# Patient Record
Sex: Male | Born: 1955
Health system: Southern US, Community
[De-identification: ages and names within clinical notes are randomized; demographics above are authoritative.]

## PROBLEM LIST (undated history)

## (undated) DIAGNOSIS — F411 Generalized anxiety disorder: Secondary | ICD-10-CM

## (undated) DIAGNOSIS — E119 Type 2 diabetes mellitus without complications: Secondary | ICD-10-CM

## (undated) DIAGNOSIS — R519 Headache, unspecified: Secondary | ICD-10-CM

## (undated) DIAGNOSIS — M549 Dorsalgia, unspecified: Secondary | ICD-10-CM

## (undated) DIAGNOSIS — F259 Schizoaffective disorder, unspecified: Secondary | ICD-10-CM

## (undated) DIAGNOSIS — G8929 Other chronic pain: Secondary | ICD-10-CM

## (undated) DIAGNOSIS — F1220 Cannabis dependence, uncomplicated: Secondary | ICD-10-CM

## (undated) DIAGNOSIS — I10 Essential (primary) hypertension: Secondary | ICD-10-CM

## (undated) DIAGNOSIS — J449 Chronic obstructive pulmonary disease, unspecified: Secondary | ICD-10-CM

## (undated) DIAGNOSIS — F319 Bipolar disorder, unspecified: Secondary | ICD-10-CM

## (undated) DIAGNOSIS — R51 Headache: Secondary | ICD-10-CM

---
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
## 2001-11-15 ENCOUNTER — Emergency Department (HOSPITAL_COMMUNITY): Admission: EM | Admit: 2001-11-15 | Discharge: 2001-11-15 | Payer: Self-pay | Admitting: *Deleted

## 2010-08-03 ENCOUNTER — Telehealth: Payer: Self-pay | Admitting: Internal Medicine

## 2010-08-03 NOTE — Telephone Encounter (Signed)
ERROR [name]

## 2016-04-30 ENCOUNTER — Other Ambulatory Visit: Payer: Self-pay

## 2016-05-03 ENCOUNTER — Encounter (HOSPITAL_COMMUNITY): Payer: Self-pay

## 2016-05-03 ENCOUNTER — Emergency Department (HOSPITAL_COMMUNITY)
Admission: EM | Admit: 2016-05-03 | Discharge: 2016-05-05 | Disposition: A | Discharge status: Home / Self Care | Payer: Medicare Other | Attending: Emergency Medicine | Admitting: Emergency Medicine

## 2016-05-03 DIAGNOSIS — F411 Generalized anxiety disorder: Secondary | ICD-10-CM | POA: Diagnosis not present

## 2016-05-03 DIAGNOSIS — F22 Delusional disorders: Secondary | ICD-10-CM | POA: Insufficient documentation

## 2016-05-03 DIAGNOSIS — Z5181 Encounter for therapeutic drug level monitoring: Secondary | ICD-10-CM | POA: Diagnosis not present

## 2016-05-03 DIAGNOSIS — I10 Essential (primary) hypertension: Secondary | ICD-10-CM | POA: Diagnosis not present

## 2016-05-03 DIAGNOSIS — F3113 Bipolar disorder, current episode manic without psychotic features, severe: Secondary | ICD-10-CM | POA: Diagnosis not present

## 2016-05-03 DIAGNOSIS — F17210 Nicotine dependence, cigarettes, uncomplicated: Secondary | ICD-10-CM | POA: Insufficient documentation

## 2016-05-03 DIAGNOSIS — F3132 Bipolar disorder, current episode depressed, moderate: Secondary | ICD-10-CM | POA: Diagnosis not present

## 2016-05-03 DIAGNOSIS — F600 Paranoid personality disorder: Secondary | ICD-10-CM | POA: Diagnosis not present

## 2016-05-03 DIAGNOSIS — Z046 Encounter for general psychiatric examination, requested by authority: Secondary | ICD-10-CM

## 2016-05-03 DIAGNOSIS — Z79899 Other long term (current) drug therapy: Secondary | ICD-10-CM | POA: Diagnosis not present

## 2016-05-03 HISTORY — DX: Dorsalgia, unspecified: M54.9

## 2016-05-03 HISTORY — DX: Essential (primary) hypertension: I10

## 2016-05-03 HISTORY — DX: Other chronic pain: G89.29

## 2016-05-03 HISTORY — DX: Bipolar disorder, unspecified: F31.9

## 2016-05-03 LAB — RAPID URINE DRUG SCREEN, HOSP PERFORMED
Amphetamines: NOT DETECTED
BARBITURATES: NOT DETECTED
Benzodiazepines: NOT DETECTED
COCAINE: NOT DETECTED
Opiates: NOT DETECTED
TETRAHYDROCANNABINOL: POSITIVE — AB

## 2016-05-03 LAB — COMPREHENSIVE METABOLIC PANEL
ALBUMIN: 3.9 g/dL (ref 3.5–5.0)
ALK PHOS: 60 U/L (ref 38–126)
ALT: 33 U/L (ref 17–63)
ANION GAP: 7 (ref 5–15)
AST: 37 U/L (ref 15–41)
BILIRUBIN TOTAL: 0.5 mg/dL (ref 0.3–1.2)
BUN: 29 mg/dL — ABNORMAL HIGH (ref 6–20)
CO2: 24 mmol/L (ref 22–32)
CREATININE: 1.3 mg/dL — AB (ref 0.61–1.24)
Calcium: 9 mg/dL (ref 8.9–10.3)
Chloride: 105 mmol/L (ref 101–111)
GFR calc non Af Amer: 59 mL/min — ABNORMAL LOW (ref >60)
GLUCOSE: 113 mg/dL — AB (ref 65–99)
Potassium: 4.6 mmol/L (ref 3.5–5.1)
Sodium: 136 mmol/L (ref 135–145)
Total Protein: 6.8 g/dL (ref 6.5–8.1)

## 2016-05-03 LAB — SALICYLATE LEVEL: Salicylate Lvl: <7 mg/dL (ref 2.8–30.0)

## 2016-05-03 LAB — CBC
HEMATOCRIT: 37.5 % — AB (ref 39.0–52.0)
Hemoglobin: 12.5 g/dL — ABNORMAL LOW (ref 13.0–17.0)
MCH: 32.6 pg (ref 26.0–34.0)
MCHC: 33.3 g/dL (ref 30.0–36.0)
MCV: 97.7 fL (ref 78.0–100.0)
Platelets: 95 10*3/uL — ABNORMAL LOW (ref 150–400)
RBC: 3.84 MIL/uL — ABNORMAL LOW (ref 4.22–5.81)
RDW: 15.5 % (ref 11.5–15.5)
WBC: 5.9 10*3/uL (ref 4.0–10.5)

## 2016-05-03 LAB — ETHANOL: Alcohol, Ethyl (B): <5 mg/dL (ref <5)

## 2016-05-03 LAB — ACETAMINOPHEN LEVEL

## 2016-05-03 MED ORDER — ASENAPINE MALEATE 5 MG SL SUBL
10.0000 mg | SUBLINGUAL_TABLET | Freq: Two times a day (BID) | SUBLINGUAL | Status: DC
  Administered 2016-05-03 – 2016-05-04 (×2): 10 mg via SUBLINGUAL
  Filled 2016-05-03 (×2): qty 2

## 2016-05-03 MED ORDER — NICOTINE 21 MG/24HR TD PT24
21.0000 mg | MEDICATED_PATCH | Freq: Every day | TRANSDERMAL | Status: DC
  Administered 2016-05-04: 21 mg via TRANSDERMAL
  Filled 2016-05-03 (×2): qty 1

## 2016-05-03 MED ORDER — ALUM & MAG HYDROXIDE-SIMETH 200-200-20 MG/5ML PO SUSP: 30.0000 mL | ORAL | Status: DC | PRN

## 2016-05-03 MED ORDER — ACETAMINOPHEN 325 MG PO TABS
650.0000 mg | ORAL_TABLET | ORAL | Status: DC | PRN
  Administered 2016-05-04: 650 mg via ORAL
  Filled 2016-05-03: qty 2

## 2016-05-03 MED ORDER — ONDANSETRON HCL 4 MG PO TABS: 4.0000 mg | ORAL_TABLET | Freq: Three times a day (TID) | ORAL | Status: DC | PRN

## 2016-05-03 MED ORDER — ZOLPIDEM TARTRATE 5 MG PO TABS: 5.0000 mg | ORAL_TABLET | Freq: Every evening | ORAL | Status: DC | PRN

## 2016-05-03 MED ORDER — IBUPROFEN 200 MG PO TABS
600.0000 mg | ORAL_TABLET | Freq: Three times a day (TID) | ORAL | Status: DC | PRN
  Administered 2016-05-05: 600 mg via ORAL
  Filled 2016-05-03: qty 3

## 2016-05-03 MED ORDER — LORAZEPAM 1 MG PO TABS: 1.0000 mg | ORAL_TABLET | Freq: Three times a day (TID) | ORAL | Status: DC | PRN
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
## 2016-05-03 NOTE — ED Triage Notes (Signed)
Pt presents w/ delusions that "bad men are messing with my wife."  Pt reports that "people can talk to me through my mind."  Pt was sent by psychiatrist.  Pt reports that he takes 3 Depakote per day.  Sts the psychiatrist called in another medication today, but Pt is unsure what the medication is.  Sts "it's something to ease my mind and help me sleep."  Denies SI/HI/AVH.
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
## 2016-05-04 ENCOUNTER — Encounter (HOSPITAL_COMMUNITY): Payer: Self-pay | Admitting: Emergency Medicine

## 2016-05-04 DIAGNOSIS — F411 Generalized anxiety disorder: Secondary | ICD-10-CM | POA: Diagnosis not present

## 2016-05-04 DIAGNOSIS — F3113 Bipolar disorder, current episode manic without psychotic features, severe: Secondary | ICD-10-CM

## 2016-05-04 DIAGNOSIS — F17210 Nicotine dependence, cigarettes, uncomplicated: Secondary | ICD-10-CM | POA: Diagnosis not present

## 2016-05-04 DIAGNOSIS — Z79899 Other long term (current) drug therapy: Secondary | ICD-10-CM

## 2016-05-04 MED ORDER — GLIPIZIDE 10 MG PO TABS
10.0000 mg | ORAL_TABLET | Freq: Two times a day (BID) | ORAL | Status: DC
  Administered 2016-05-04 – 2016-05-05 (×2): 10 mg via ORAL
  Filled 2016-05-04 (×2): qty 1

## 2016-05-04 MED ORDER — MELOXICAM 15 MG PO TABS
15.0000 mg | ORAL_TABLET | Freq: Every day | ORAL | Status: DC
  Administered 2016-05-04 – 2016-05-05 (×2): 15 mg via ORAL
  Filled 2016-05-04 (×2): qty 1

## 2016-05-04 MED ORDER — LISINOPRIL 40 MG PO TABS
40.0000 mg | ORAL_TABLET | Freq: Every day | ORAL | Status: DC
  Administered 2016-05-04 – 2016-05-05 (×2): 40 mg via ORAL
  Filled 2016-05-04 (×2): qty 1

## 2016-05-04 MED ORDER — HYDROCHLOROTHIAZIDE 25 MG PO TABS
25.0000 mg | ORAL_TABLET | Freq: Every day | ORAL | Status: DC
  Administered 2016-05-04 – 2016-05-05 (×2): 25 mg via ORAL
  Filled 2016-05-04 (×2): qty 1

## 2016-05-04 MED ORDER — ASENAPINE MALEATE 5 MG SL SUBL
10.0000 mg | SUBLINGUAL_TABLET | Freq: Every day | SUBLINGUAL | Status: DC
  Administered 2016-05-04: 10 mg via SUBLINGUAL
  Filled 2016-05-04: qty 2

## 2016-05-04 MED ORDER — TRAZODONE HCL 50 MG PO TABS
50.0000 mg | ORAL_TABLET | Freq: Every evening | ORAL | Status: DC | PRN
  Administered 2016-05-04 (×2): 50 mg via ORAL
  Filled 2016-05-04 (×2): qty 1

## 2016-05-04 MED ORDER — ASENAPINE MALEATE 5 MG SL SUBL: 5.0000 mg | SUBLINGUAL_TABLET | Freq: Every day | SUBLINGUAL | Status: DC

## 2016-05-04 MED ORDER — LAMOTRIGINE 25 MG PO TABS
25.0000 mg | ORAL_TABLET | Freq: Every day | ORAL | Status: DC
  Administered 2016-05-04 – 2016-05-05 (×2): 25 mg via ORAL
  Filled 2016-05-04 (×2): qty 1

## 2016-05-04 MED ORDER — DIVALPROEX SODIUM ER 500 MG PO TB24
1500.0000 mg | ORAL_TABLET | Freq: Every day | ORAL | Status: DC
Start: 2016-05-04 — End: 2016-05-04

## 2016-05-04 NOTE — ED Notes (Signed)
Family at bedside. 

## 2016-05-04 NOTE — ED Notes (Signed)
Lunch tray given. 

## 2016-05-04 NOTE — ED Notes (Signed)
This nurse in pt room with psychiatry team for morning rounds. Pt behavior cooperative on approach. Pt reports he has been off his medication d/t he felt the Depakote was holding him back from his psychic ability of interpreting people. Pt also reports he has not slept good in 3 days. Special checks q 15 mins in place for safety. Video monitoring in place. Will continue to monitor.
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
## 2016-05-04 NOTE — ED Notes (Signed)
Patient denies SI,HI and AVH at this time. Plan of care discussed with patient. Patient voices no complaints or concerns at this time. Encouragement and support provided and safety maintain. Q 15 min safety checks remain in place and video monitoring.

## 2016-05-04 NOTE — ED Notes (Signed)
SBAR Report received from previous nurse. Pt received calm and visible on unit. Pt denies current SI/ HI, A/V H, depression, anxiety, or pain at this time, and appears otherwise stable and free of distress. Pt reminded of camera surveillance, q 15 min rounds, and rules of the milieu. Pt refused to put on yellow socks provided. Several staff have encouraged putting on footwear. Will continue to assess.

## 2016-05-04 NOTE — Progress Notes (Signed)
05/04/16 1444:  LRT introduced self to pt and offered activities.  Pt wanted to play Jenga.  Pt and LRT played Jenga in pt room.  Pt was social and pleasant.  Pt was smiling and stated the activity gave him something to do because "this place is boring".  [name], LRT/CTRS

## 2016-05-04 NOTE — ED Notes (Signed)
Pt wife at bedside visiting with pt.  
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
## 2016-05-05 DIAGNOSIS — Z79899 Other long term (current) drug therapy: Secondary | ICD-10-CM | POA: Diagnosis not present

## 2016-05-05 DIAGNOSIS — F17210 Nicotine dependence, cigarettes, uncomplicated: Secondary | ICD-10-CM | POA: Diagnosis not present

## 2016-05-05 DIAGNOSIS — F411 Generalized anxiety disorder: Secondary | ICD-10-CM | POA: Diagnosis not present

## 2016-05-05 DIAGNOSIS — F3113 Bipolar disorder, current episode manic without psychotic features, severe: Secondary | ICD-10-CM | POA: Diagnosis not present

## 2016-05-05 MED ORDER — GLIPIZIDE 10 MG PO TABS: 10.0000 mg | ORAL_TABLET | Freq: Two times a day (BID) | ORAL | 0 refills | Status: DC

## 2016-05-05 MED ORDER — ASENAPINE MALEATE 5 MG SL SUBL: 10.0000 mg | SUBLINGUAL_TABLET | Freq: Every day | SUBLINGUAL | 1 refills | Status: DC

## 2016-05-05 MED ORDER — DIVALPROEX SODIUM ER 500 MG PO TB24: 1500.0000 mg | ORAL_TABLET | Freq: Every day | ORAL | 0 refills | Status: DC

## 2016-05-05 MED ORDER — ASENAPINE MALEATE 5 MG SL SUBL: 10.0000 mg | SUBLINGUAL_TABLET | Freq: Every day | SUBLINGUAL | 0 refills | Status: DC

## 2016-05-05 MED ORDER — LISINOPRIL 40 MG PO TABS: 40.0000 mg | ORAL_TABLET | ORAL | 0 refills | Status: DC

## 2016-05-05 MED ORDER — HYDROCHLOROTHIAZIDE 25 MG PO TABS: 25.0000 mg | ORAL_TABLET | ORAL | 0 refills | Status: DC

## 2016-05-05 NOTE — [facility] Suicide Risk Assessment (Signed)
Suicide Risk Assessment  Discharge Assessment   [facility] Discharge Suicide Risk Assessment   Principal Problem: Bipolar affective disorder, manic, severe ([place]) Discharge Diagnoses:  Patient Active Problem List   Diagnosis Date Noted  . Bipolar affective disorder, manic, severe ([place]) [F31.13] 05/04/2016    Priority: High  . Generalized anxiety disorder [F41.1] 05/04/2016    Priority: High    Total Time spent with patient: 30 minutes  Musculoskeletal: Strength & Muscle Tone: within normal limits Gait & Station: normal Patient leans: N/A  Psychiatric Specialty Exam: Physical Exam  Constitutional: He is oriented to person, place, and time. He appears well-developed and well-nourished.  HENT:  Head: Normocephalic.  Neck: Normal range of motion.  Respiratory: Effort normal.  Musculoskeletal: Normal range of motion.  Neurological: He is alert and oriented to person, place, and time.  Psychiatric: Judgment normal. His mood appears anxious. His affect is labile. His speech is tangential. He is hyperactive. Thought content is delusional. Cognition and memory are normal.    Review of Systems  Psychiatric/Behavioral: The patient is nervous/anxious and has insomnia.   All other systems reviewed and are negative.   Blood pressure 154/80, pulse 74, temperature 98.4 F (36.9 C), temperature source Oral, resp. rate 18, weight 99.3 kg (219 lb), SpO2 99 %.There is no height or weight on file to calculate BMI.  General Appearance: Casual  Eye Contact:  Fair  Speech:  Pressured, slight  Volume:  Normal  Mood:  Anxious and Euphoric  Affect:  Blunt  Thought Process:  Coherent and Descriptions of Associations: Tangential  Orientation:  Full (Time, Place, and Person)  Thought Content:  Delusions and Tangential  Suicidal Thoughts:  No  Homicidal Thoughts:  No  Memory:  Immediate;   Fair Recent;   Fair Remote;   Fair  Judgement:  Fair  Insight:  Fair  Psychomotor Activity:  Increased   Concentration:  Concentration: Fair and Attention Span: Fair  Recall:  [employer] of Knowledge:  Fair  Language:  Good  Akathisia:  No  Handed:  Right  AIMS (if indicated):     Assets:  Housing Leisure Time Physical Health Resilience Social Support  ADL's:  Intact  Cognition:  WNL  Sleep:       Mental Status Per Nursing Assessment::   On Admission:   mania  Demographic Factors:  Male and Caucasian  Loss Factors: NA  Historical Factors: NA  Risk Reduction Factors:   Sense of responsibility to family, Living with another person, especially a relative, Positive social support and Positive therapeutic relationship  Continued Clinical Symptoms:  None  Cognitive Features That Contribute To Risk:  None    Suicide Risk:  Minimal: No identifiable suicidal ideation.  Patients presenting with no risk factors but with morbid ruminations; may be classified as minimal risk based on the severity of the depressive symptoms    Plan Of Care/Follow-up recommendations:  Activity:  as tolerated Diet:  heart healthy diet  LORD, JAMISON, NP 05/05/2016, 10:35 AM
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
## 2016-05-05 NOTE — ED Notes (Signed)
Pt discharged home. Discharged instructions read to pt who verbalized understanding. All belongings returned to pt who signed for same. Denies SI/HI, is not delusional and not responding to internal stimuli. Escorted pt to the ED exit.    

## 2016-05-05 NOTE — Discharge Instructions (Signed)
For your ongoing behavioral health needs, you are advised to follow up with the [facility].  You have an appointment scheduled for tomorrow, Thursday, 05/06/2016 at 2:45 pm, with Stephannie [name], NP:       [facility]      [id number] [place], [place] [address]      [phone]

## 2016-05-05 NOTE — BH Assessment (Signed)
[facility] Assessment Progress Note  Per Corena Pilgrim, MD, this pt does not require psychiatric hospitalization at this time.  Pt presents under IVC initiated by EDP Daleen Bo, MD, which Dr Darleene Cleaver has rescinded.  Pt is to be discharged from [facility] with recommendation to follow up with Stephannie [name], NP at the [facility], his current outpatient provider.  Pt has an appointment scheduled for tomorrow, 05/06/2016 at 14:45.  This has been included in pt's discharge instructions.  Pt's nurse, Diane, has been notified.  Jalene Mullet, [facility] Triage Specialist [phone]

## 2016-05-06 DIAGNOSIS — F3132 Bipolar disorder, current episode depressed, moderate: Secondary | ICD-10-CM | POA: Diagnosis not present

## 2016-05-06 DIAGNOSIS — F411 Generalized anxiety disorder: Secondary | ICD-10-CM | POA: Diagnosis not present

## 2016-05-09 ENCOUNTER — Emergency Department (HOSPITAL_COMMUNITY)
Admission: EM | Admit: 2016-05-09 | Discharge: 2016-05-11 | Disposition: A | Discharge status: Other Acute Inpatient Hospital | Payer: Medicare Other | Attending: Emergency Medicine | Admitting: Emergency Medicine

## 2016-05-09 ENCOUNTER — Encounter (HOSPITAL_COMMUNITY): Payer: Self-pay | Admitting: Nurse Practitioner

## 2016-05-09 ENCOUNTER — Emergency Department (HOSPITAL_COMMUNITY)
Admission: EM | Admit: 2016-05-09 | Discharge: 2016-05-09 | Disposition: A | Discharge status: Home / Self Care | Payer: Medicare Other

## 2016-05-09 ENCOUNTER — Emergency Department (HOSPITAL_COMMUNITY): Payer: Medicare Other

## 2016-05-09 DIAGNOSIS — F17210 Nicotine dependence, cigarettes, uncomplicated: Secondary | ICD-10-CM | POA: Diagnosis not present

## 2016-05-09 DIAGNOSIS — R4182 Altered mental status, unspecified: Secondary | ICD-10-CM | POA: Diagnosis not present

## 2016-05-09 DIAGNOSIS — Z79899 Other long term (current) drug therapy: Secondary | ICD-10-CM | POA: Insufficient documentation

## 2016-05-09 DIAGNOSIS — F250 Schizoaffective disorder, bipolar type: Secondary | ICD-10-CM | POA: Diagnosis not present

## 2016-05-09 DIAGNOSIS — F3013 Manic episode, severe, without psychotic symptoms: Secondary | ICD-10-CM | POA: Insufficient documentation

## 2016-05-09 DIAGNOSIS — I10 Essential (primary) hypertension: Secondary | ICD-10-CM | POA: Diagnosis not present

## 2016-05-09 DIAGNOSIS — F3113 Bipolar disorder, current episode manic without psychotic features, severe: Secondary | ICD-10-CM

## 2016-05-09 DIAGNOSIS — F1220 Cannabis dependence, uncomplicated: Secondary | ICD-10-CM | POA: Diagnosis present

## 2016-05-09 DIAGNOSIS — F918 Other conduct disorders: Secondary | ICD-10-CM | POA: Diagnosis present

## 2016-05-09 DIAGNOSIS — F411 Generalized anxiety disorder: Secondary | ICD-10-CM | POA: Diagnosis not present

## 2016-05-09 DIAGNOSIS — F309 Manic episode, unspecified: Secondary | ICD-10-CM | POA: Diagnosis not present

## 2016-05-09 DIAGNOSIS — Z9889 Other specified postprocedural states: Secondary | ICD-10-CM | POA: Diagnosis not present

## 2016-05-09 LAB — COMPREHENSIVE METABOLIC PANEL
ALT: 34 U/L (ref 17–63)
AST: 33 U/L (ref 15–41)
Albumin: 3.7 g/dL (ref 3.5–5.0)
Alkaline Phosphatase: 60 U/L (ref 38–126)
Anion gap: 10 (ref 5–15)
BUN: 39 mg/dL — ABNORMAL HIGH (ref 6–20)
CO2: 19 mmol/L — ABNORMAL LOW (ref 22–32)
Calcium: 8.8 mg/dL — ABNORMAL LOW (ref 8.9–10.3)
Chloride: 109 mmol/L (ref 101–111)
Creatinine, Ser: 1.79 mg/dL — ABNORMAL HIGH (ref 0.61–1.24)
GFR calc Af Amer: 46 mL/min — ABNORMAL LOW (ref >60)
GFR calc non Af Amer: 40 mL/min — ABNORMAL LOW (ref >60)
Glucose, Bld: 51 mg/dL — ABNORMAL LOW (ref 65–99)
Potassium: 4.1 mmol/L (ref 3.5–5.1)
Sodium: 138 mmol/L (ref 135–145)
Total Bilirubin: 0.6 mg/dL (ref 0.3–1.2)
Total Protein: 6 g/dL — ABNORMAL LOW (ref 6.5–8.1)

## 2016-05-09 LAB — CBC
HCT: 39 % (ref 39.0–52.0)
Hemoglobin: 13.1 g/dL (ref 13.0–17.0)
MCH: 33.4 pg (ref 26.0–34.0)
MCHC: 33.6 g/dL (ref 30.0–36.0)
MCV: 99.5 fL (ref 78.0–100.0)
Platelets: 135 10*3/uL — ABNORMAL LOW (ref 150–400)
RBC: 3.92 MIL/uL — ABNORMAL LOW (ref 4.22–5.81)
RDW: 15.3 % (ref 11.5–15.5)
WBC: 5.5 10*3/uL (ref 4.0–10.5)

## 2016-05-09 LAB — SALICYLATE LEVEL: Salicylate Lvl: <7 mg/dL (ref 2.8–30.0)

## 2016-05-09 LAB — RAPID URINE DRUG SCREEN, HOSP PERFORMED
Amphetamines: NOT DETECTED
Barbiturates: NOT DETECTED
Benzodiazepines: NOT DETECTED
Cocaine: NOT DETECTED
Opiates: NOT DETECTED
Tetrahydrocannabinol: POSITIVE — AB

## 2016-05-09 LAB — ACETAMINOPHEN LEVEL: Acetaminophen (Tylenol), Serum: <10 ug/mL — ABNORMAL LOW (ref 10–30)

## 2016-05-09 LAB — ETHANOL: Alcohol, Ethyl (B): <5 mg/dL (ref <5)

## 2016-05-09 MED ORDER — ONDANSETRON HCL 4 MG PO TABS: 4.0000 mg | ORAL_TABLET | Freq: Three times a day (TID) | ORAL | Status: DC | PRN

## 2016-05-09 MED ORDER — ACETAMINOPHEN 325 MG PO TABS
650.0000 mg | ORAL_TABLET | ORAL | Status: DC | PRN
  Administered 2016-05-09 – 2016-05-11 (×5): 650 mg via ORAL
  Filled 2016-05-09 (×5): qty 2

## 2016-05-09 MED ORDER — IPRATROPIUM-ALBUTEROL 0.5-2.5 (3) MG/3ML IN SOLN
3.0000 mL | Freq: Once | RESPIRATORY_TRACT | Status: AC
  Administered 2016-05-09: 3 mL via RESPIRATORY_TRACT
  Filled 2016-05-09: qty 3

## 2016-05-09 MED ORDER — SODIUM CHLORIDE 0.9 % IV BOLUS (SEPSIS)
1000.0000 mL | Freq: Once | INTRAVENOUS | Status: AC
  Administered 2016-05-09: 1000 mL via INTRAVENOUS

## 2016-05-09 MED ORDER — NICOTINE 21 MG/24HR TD PT24
21.0000 mg | MEDICATED_PATCH | Freq: Every day | TRANSDERMAL | Status: DC
  Administered 2016-05-10: 21 mg via TRANSDERMAL
  Filled 2016-05-09: qty 1
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
## 2016-05-09 NOTE — ED Notes (Signed)
Bed: WA32 Expected date:  Expected time:  Means of arrival:  Comments: 

## 2016-05-09 NOTE — ED Notes (Signed)
Bed: [facility] Expected date:  Expected time:  Means of arrival:  Comments: Hold for redman

## 2016-05-09 NOTE — ED Notes (Signed)
Pt is 61 yo WM admitted to bed 37.  Pt is alert, awake and responds to questions.  Pt makes good eye contact.  Pt sts he does not feel agitated, anxious or suicidal.  Pt verbally contracts for safety.  Pt sts he has lower back pain and is cold.  Pt given 2 blankets and heat pad.  Pt is in room watching TV.

## 2016-05-10 DIAGNOSIS — F1220 Cannabis dependence, uncomplicated: Secondary | ICD-10-CM | POA: Diagnosis present

## 2016-05-10 DIAGNOSIS — F250 Schizoaffective disorder, bipolar type: Secondary | ICD-10-CM | POA: Diagnosis present

## 2016-05-10 DIAGNOSIS — F17210 Nicotine dependence, cigarettes, uncomplicated: Secondary | ICD-10-CM

## 2016-05-10 DIAGNOSIS — Z9889 Other specified postprocedural states: Secondary | ICD-10-CM

## 2016-05-10 DIAGNOSIS — F411 Generalized anxiety disorder: Secondary | ICD-10-CM

## 2016-05-10 DIAGNOSIS — Z79899 Other long term (current) drug therapy: Secondary | ICD-10-CM

## 2016-05-10 HISTORY — DX: Cannabis dependence, uncomplicated: F12.20

## 2016-05-10 LAB — URINALYSIS, ROUTINE W REFLEX MICROSCOPIC
Bilirubin Urine: NEGATIVE
GLUCOSE, UA: NEGATIVE mg/dL
HGB URINE DIPSTICK: NEGATIVE
Ketones, ur: 20 mg/dL — AB
Leukocytes, UA: NEGATIVE
Nitrite: NEGATIVE
PH: 6 (ref 5.0–8.0)
PROTEIN: NEGATIVE mg/dL
SPECIFIC GRAVITY, URINE: 1.018 (ref 1.005–1.030)

## 2016-05-10 MED ORDER — RISPERIDONE 1 MG PO TABS
1.0000 mg | ORAL_TABLET | Freq: Two times a day (BID) | ORAL | Status: DC
  Administered 2016-05-10 (×2): 1 mg via ORAL
  Filled 2016-05-10 (×2): qty 1

## 2016-05-10 MED ORDER — LISINOPRIL 40 MG PO TABS
40.0000 mg | ORAL_TABLET | ORAL | Status: DC
  Administered 2016-05-10 – 2016-05-11 (×2): 40 mg via ORAL
  Filled 2016-05-10 (×2): qty 1

## 2016-05-10 MED ORDER — BENZTROPINE MESYLATE 0.5 MG PO TABS
0.5000 mg | ORAL_TABLET | Freq: Two times a day (BID) | ORAL | Status: DC
  Administered 2016-05-10 (×2): 0.5 mg via ORAL
  Filled 2016-05-10 (×2): qty 1

## 2016-05-10 MED ORDER — LAMOTRIGINE 25 MG PO TABS
25.0000 mg | ORAL_TABLET | Freq: Every day | ORAL | Status: DC
  Administered 2016-05-10: 25 mg via ORAL
  Filled 2016-05-10: qty 1

## 2016-05-10 MED ORDER — GLIPIZIDE 10 MG PO TABS
10.0000 mg | ORAL_TABLET | Freq: Two times a day (BID) | ORAL | Status: DC
  Administered 2016-05-10 – 2016-05-11 (×2): 10 mg via ORAL
  Filled 2016-05-10 (×2): qty 1

## 2016-05-10 MED ORDER — HYDROXYZINE HCL 25 MG PO TABS
25.0000 mg | ORAL_TABLET | Freq: Two times a day (BID) | ORAL | Status: DC
  Administered 2016-05-10 – 2016-05-11 (×2): 25 mg via ORAL
  Filled 2016-05-10 (×3): qty 1

## 2016-05-10 MED ORDER — HYDROCHLOROTHIAZIDE 25 MG PO TABS
25.0000 mg | ORAL_TABLET | ORAL | Status: DC
  Administered 2016-05-10 – 2016-05-11 (×2): 25 mg via ORAL
  Filled 2016-05-10 (×2): qty 1

## 2016-05-10 NOTE — BHH Counselor (Signed)
Clinician received a call from [facility] informing that the pt's referral was received.   Edd Fabian, MS, [facility], [facility] Triage Specialist [phone]

## 2016-05-10 NOTE — BHH Counselor (Signed)
Clinician spoke to [facility] and noted the pt was accepted to [facility] in [place]. Pt can come after 0700, tomorrow. Attending physiican is Dr. Shirlean Schlein. Nursing report: [phone]. Clinician provide the information to [facility].   Edd Fabian, MS, [facility], [facility] Triage Specialist [phone]

## 2016-05-10 NOTE — Progress Notes (Signed)
05/10/16 1406:  LRT went to pt room to offer activities.  Pt couldn't decide what he wanted to do.  Pt eventually decided he wanted to play checkers.  LRT played checkers with pt and talked with him about his day.  [name], LRT/CTRS

## 2016-05-10 NOTE — ED Notes (Signed)
Patient states he was not taking medications as ordered and that is why he is here. Patient wants to get back on medications and go home. Patient provided educated to  take medications as ordered when he does go home. And Patient with Q 15 minute checks in progress and patient remains safe on unit.
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
## 2016-05-10 NOTE — BH Assessment (Signed)
[facility] Assessment Progress Note  Per Corena Pilgrim, MD, this pt requires psychiatric hospitalization at this time.  The following facilities have been contacted to seek placement for this pt, with results as noted:  Beds available, information sent, decision pending:  [facility]   At capacity:  Santiago Bumpers, [place] Triage Specialist [phone]
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
## 2016-05-10 NOTE — ED Notes (Signed)
Pt had an uneventful day. He spent most of the morning attention seeking asking for things like wanting the TV channel turned, hot packs, wanting to talk but presented with a pleasant affect. He did c/o pain and was given PRN Tylenol with fair results. Pt denied SI/HI/AVH. 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 His wife visited and dropped off his glasses. Their interaction was very positive and therapeutic for pt. Pt asleep at this time with no further complaints.

## 2016-05-11 DIAGNOSIS — R45850 Homicidal ideations: Secondary | ICD-10-CM | POA: Diagnosis not present

## 2016-05-11 DIAGNOSIS — R079 Chest pain, unspecified: Secondary | ICD-10-CM | POA: Diagnosis not present

## 2016-05-11 DIAGNOSIS — E1121 Type 2 diabetes mellitus with diabetic nephropathy: Secondary | ICD-10-CM | POA: Diagnosis not present

## 2016-05-11 DIAGNOSIS — F259 Schizoaffective disorder, unspecified: Secondary | ICD-10-CM | POA: Diagnosis not present

## 2016-05-11 DIAGNOSIS — E039 Hypothyroidism, unspecified: Secondary | ICD-10-CM | POA: Diagnosis not present

## 2016-05-11 DIAGNOSIS — I519 Heart disease, unspecified: Secondary | ICD-10-CM | POA: Diagnosis not present

## 2016-05-11 DIAGNOSIS — F3013 Manic episode, severe, without psychotic symptoms: Secondary | ICD-10-CM | POA: Diagnosis not present

## 2016-05-11 DIAGNOSIS — G4700 Insomnia, unspecified: Secondary | ICD-10-CM | POA: Diagnosis not present

## 2016-05-11 DIAGNOSIS — F17200 Nicotine dependence, unspecified, uncomplicated: Secondary | ICD-10-CM | POA: Diagnosis not present

## 2016-05-11 DIAGNOSIS — F250 Schizoaffective disorder, bipolar type: Secondary | ICD-10-CM | POA: Diagnosis not present

## 2016-05-11 DIAGNOSIS — F319 Bipolar disorder, unspecified: Secondary | ICD-10-CM | POA: Diagnosis not present

## 2016-05-11 DIAGNOSIS — M545 Low back pain: Secondary | ICD-10-CM | POA: Diagnosis not present

## 2016-05-11 DIAGNOSIS — M1990 Unspecified osteoarthritis, unspecified site: Secondary | ICD-10-CM | POA: Diagnosis not present

## 2016-05-11 DIAGNOSIS — Z79899 Other long term (current) drug therapy: Secondary | ICD-10-CM | POA: Diagnosis not present

## 2016-05-11 DIAGNOSIS — M2550 Pain in unspecified joint: Secondary | ICD-10-CM | POA: Diagnosis not present

## 2016-05-11 DIAGNOSIS — Z716 Tobacco abuse counseling: Secondary | ICD-10-CM | POA: Diagnosis not present

## 2016-05-11 DIAGNOSIS — F201 Disorganized schizophrenia: Secondary | ICD-10-CM | POA: Diagnosis not present

## 2016-05-11 DIAGNOSIS — F17210 Nicotine dependence, cigarettes, uncomplicated: Secondary | ICD-10-CM | POA: Diagnosis not present

## 2016-05-11 DIAGNOSIS — E785 Hyperlipidemia, unspecified: Secondary | ICD-10-CM | POA: Diagnosis not present

## 2016-05-11 DIAGNOSIS — R790 Abnormal level of blood mineral: Secondary | ICD-10-CM | POA: Diagnosis not present

## 2016-05-11 DIAGNOSIS — R51 Headache: Secondary | ICD-10-CM | POA: Diagnosis not present

## 2016-05-11 DIAGNOSIS — R7309 Other abnormal glucose: Secondary | ICD-10-CM | POA: Diagnosis not present

## 2016-05-11 DIAGNOSIS — E109 Type 1 diabetes mellitus without complications: Secondary | ICD-10-CM | POA: Diagnosis not present

## 2016-05-11 DIAGNOSIS — I10 Essential (primary) hypertension: Secondary | ICD-10-CM | POA: Diagnosis not present

## 2016-05-11 DIAGNOSIS — E7800 Pure hypercholesterolemia, unspecified: Secondary | ICD-10-CM | POA: Diagnosis not present

## 2016-05-11 NOTE — ED Notes (Signed)
Attempted to call report and was told they would have to call back after med pass.

## 2016-05-11 NOTE — ED Notes (Signed)
Pt discharged ambulatory with [facility].  All belongings were sent with pt.  Pt was calm and cooperative at time of transport.

## 2016-06-08 DIAGNOSIS — M5136 Other intervertebral disc degeneration, lumbar region: Secondary | ICD-10-CM | POA: Diagnosis not present

## 2016-06-09 DIAGNOSIS — I10 Essential (primary) hypertension: Secondary | ICD-10-CM | POA: Diagnosis not present

## 2016-06-09 DIAGNOSIS — E119 Type 2 diabetes mellitus without complications: Secondary | ICD-10-CM | POA: Diagnosis not present

## 2016-06-09 DIAGNOSIS — F319 Bipolar disorder, unspecified: Secondary | ICD-10-CM | POA: Diagnosis not present

## 2016-06-09 DIAGNOSIS — J45909 Unspecified asthma, uncomplicated: Secondary | ICD-10-CM | POA: Diagnosis not present

## 2016-06-09 DIAGNOSIS — Z7984 Long term (current) use of oral hypoglycemic drugs: Secondary | ICD-10-CM | POA: Diagnosis not present

## 2016-06-09 DIAGNOSIS — M25512 Pain in left shoulder: Secondary | ICD-10-CM | POA: Diagnosis not present

## 2016-06-09 DIAGNOSIS — G8929 Other chronic pain: Secondary | ICD-10-CM | POA: Diagnosis not present

## 2016-06-10 DIAGNOSIS — F312 Bipolar disorder, current episode manic severe with psychotic features: Secondary | ICD-10-CM | POA: Diagnosis not present

## 2016-06-11 DIAGNOSIS — F312 Bipolar disorder, current episode manic severe with psychotic features: Secondary | ICD-10-CM | POA: Diagnosis not present

## 2016-07-26 DIAGNOSIS — F250 Schizoaffective disorder, bipolar type: Secondary | ICD-10-CM | POA: Diagnosis not present

## 2016-07-29 DIAGNOSIS — F29 Unspecified psychosis not due to a substance or known physiological condition: Secondary | ICD-10-CM | POA: Diagnosis not present

## 2016-07-29 DIAGNOSIS — G8929 Other chronic pain: Secondary | ICD-10-CM | POA: Diagnosis not present

## 2016-07-29 DIAGNOSIS — F319 Bipolar disorder, unspecified: Secondary | ICD-10-CM | POA: Diagnosis not present

## 2016-07-29 DIAGNOSIS — R5383 Other fatigue: Secondary | ICD-10-CM | POA: Diagnosis not present

## 2016-07-29 DIAGNOSIS — E119 Type 2 diabetes mellitus without complications: Secondary | ICD-10-CM | POA: Diagnosis not present

## 2016-07-29 DIAGNOSIS — M25512 Pain in left shoulder: Secondary | ICD-10-CM | POA: Diagnosis not present

## 2016-08-26 DIAGNOSIS — I10 Essential (primary) hypertension: Secondary | ICD-10-CM | POA: Diagnosis not present

## 2016-08-26 DIAGNOSIS — G8929 Other chronic pain: Secondary | ICD-10-CM | POA: Diagnosis not present

## 2016-08-26 DIAGNOSIS — Z1211 Encounter for screening for malignant neoplasm of colon: Secondary | ICD-10-CM | POA: Diagnosis not present

## 2016-08-26 DIAGNOSIS — R0600 Dyspnea, unspecified: Secondary | ICD-10-CM | POA: Diagnosis not present

## 2016-09-19 ENCOUNTER — Encounter (HOSPITAL_COMMUNITY): Payer: Self-pay | Admitting: Emergency Medicine

## 2016-09-19 ENCOUNTER — Ambulatory Visit (HOSPITAL_COMMUNITY)
Admission: EM | Admit: 2016-09-19 | Discharge: 2016-09-19 | Disposition: A | Discharge status: Home / Self Care | Payer: Medicare Other | Attending: Family Medicine | Admitting: Family Medicine

## 2016-09-19 DIAGNOSIS — G43919 Migraine, unspecified, intractable, without status migrainosus: Secondary | ICD-10-CM

## 2016-09-19 LAB — POCT I-STAT, CHEM 8
BUN: 31 mg/dL — ABNORMAL HIGH (ref 6–20)
CHLORIDE: 106 mmol/L (ref 101–111)
CREATININE: 1.4 mg/dL — AB (ref 0.61–1.24)
Calcium, Ion: 1.23 mmol/L (ref 1.15–1.40)
GLUCOSE: 75 mg/dL (ref 65–99)
HCT: 46 % (ref 39.0–52.0)
Hemoglobin: 15.6 g/dL (ref 13.0–17.0)
POTASSIUM: 4.1 mmol/L (ref 3.5–5.1)
Sodium: 137 mmol/L (ref 135–145)
TCO2: 23 mmol/L (ref 0–100)

## 2016-09-19 MED ORDER — DEXAMETHASONE SODIUM PHOSPHATE 10 MG/ML IJ SOLN
INTRAMUSCULAR | Status: AC
Start: 2016-09-19 — End: 2016-09-19
  Filled 2016-09-19: qty 1

## 2016-09-19 MED ORDER — METOCLOPRAMIDE HCL 5 MG/ML IJ SOLN
INTRAMUSCULAR | Status: AC
Start: 2016-09-19 — End: 2016-09-19
  Filled 2016-09-19: qty 2

## 2016-09-19 MED ORDER — METOCLOPRAMIDE HCL 5 MG/ML IJ SOLN
5.0000 mg | Freq: Once | INTRAMUSCULAR | Status: AC
  Administered 2016-09-19: 18:00:00 via INTRAMUSCULAR

## 2016-09-19 MED ORDER — KETOROLAC TROMETHAMINE 60 MG/2ML IM SOLN
60.0000 mg | Freq: Once | INTRAMUSCULAR | Status: AC
  Administered 2016-09-19: 60 mg via INTRAMUSCULAR

## 2016-09-19 MED ORDER — KETOROLAC TROMETHAMINE 60 MG/2ML IM SOLN
INTRAMUSCULAR | Status: AC
Start: 2016-09-19 — End: 2016-09-19
  Filled 2016-09-19: qty 2

## 2016-09-19 MED ORDER — SUMATRIPTAN SUCCINATE 6 MG/0.5ML ~~LOC~~ SOLN
6.0000 mg | Freq: Once | SUBCUTANEOUS | Status: AC
  Administered 2016-09-19: 6 mg via SUBCUTANEOUS

## 2016-09-19 MED ORDER — SUMATRIPTAN SUCCINATE 6 MG/0.5ML ~~LOC~~ SOLN
SUBCUTANEOUS | Status: AC
  Filled 2016-09-19: qty 0.5

## 2016-09-19 MED ORDER — DEXAMETHASONE SODIUM PHOSPHATE 10 MG/ML IJ SOLN
10.0000 mg | Freq: Once | INTRAMUSCULAR | Status: AC
  Administered 2016-09-19: 10 mg via INTRAMUSCULAR

## 2016-09-19 NOTE — Discharge Instructions (Signed)
Take 50mg  of benadryl when you get home tonight to help sleep if you still feel like you are unable to rest.

## 2016-09-19 NOTE — ED Notes (Signed)
Discharged  By provider   So  Reverted  Back  To last  Pain   Scale   On  Arrival  Which  Was  5

## 2016-09-19 NOTE — ED Provider Notes (Signed)
CSN: [id number]     Arrival date & time 09/19/16  1557 History   None    Chief Complaint  Patient presents with  . Headache   (Consider location/radiation/quality/duration/timing/severity/associated sxs/prior Treatment)  HPI   Patient is a 61 year old male presenting today with complaints of an intractable headache for the past 5-6 days. Patient states that he has tried tramadol, Tylenol rapid release, Excedrin, ibuprofen, along with Bayer aspirin. This is over the course of several days and not simultaneously.  Patient reports positive photophobia, positive nausea and vomiting, and some dizziness when he bends over and stands up too quickly. And states has history of migraine headaches in his 69s to 31s but that he has been mostly headache free for the past several years.   Patient denies weakness or visual changes. Patient states he is bipolar for which she is supposed to be taking Depakote. Patient states he's not been taking his Depakote regularly, but did take some this morning.   In addition the patient reports he has been a smoker since his adolescence. Patient states was told several years ago that there was issues with his blood work is concerned that might be contributing factor. The patient is requesting referral to a new primary care provider and assistance with chronic pain management.  Past Medical History:  Diagnosis Date  . Back pain   . Bipolar disorder ([place])   . Chronic pain   . Hypertension    Past Surgical History:  Procedure Laterality Date  . APPENDECTOMY     History reviewed. No pertinent family history. Social History  Substance Use Topics  . Smoking status: Current Every Day Smoker    Packs/day: 2.00    Types: Cigarettes  . Smokeless tobacco: Never Used  . Alcohol use Yes     Comment: occ    Review of Systems  Constitutional: Positive for fatigue. Negative for fever.  HENT: Negative.  Negative for congestion, sinus pain and sinus pressure.   Eyes:  Positive for photophobia. Negative for pain, redness and visual disturbance.  Respiratory: Negative.  Negative for cough and shortness of breath.   Cardiovascular: Negative.   Gastrointestinal: Negative.  Negative for diarrhea, nausea and vomiting.  Endocrine: Negative.   Genitourinary: Negative.   Musculoskeletal: Positive for back pain. Negative for neck pain and neck stiffness.  Skin: Negative.   Allergic/Immunologic: Negative.   Neurological: Positive for dizziness and headaches. Negative for tremors, seizures, syncope, facial asymmetry, speech difficulty, weakness, light-headedness and numbness.  Hematological: Negative.   Psychiatric/Behavioral: The patient is nervous/anxious.     Allergies  Patient has no known allergies.  Home Medications   Prior to Admission medications   Medication Sig Start Date End Date Taking? Authorizing Provider  asenapine (SAPHRIS) 5 MG SUBL 24 hr tablet Place 2 tablets (10 mg total) under the tongue at bedtime. 05/05/16   Patrecia Pour, NP  divalproex (DEPAKOTE ER) 500 MG 24 hr tablet Take 3 tablets (1,500 mg total) by mouth at bedtime. 05/05/16   Patrecia Pour, NP  glipiZIDE (GLUCOTROL) 10 MG tablet Take 1 tablet (10 mg total) by mouth 2 (two) times daily. 05/05/16   Patrecia Pour, NP  hydrochlorothiazide (HYDRODIURIL) 25 MG tablet Take 1 tablet (25 mg total) by mouth every morning. 05/05/16   Patrecia Pour, NP  lisinopril (PRINIVIL,ZESTRIL) 40 MG tablet Take 1 tablet (40 mg total) by mouth every morning. 05/05/16   Patrecia Pour, NP  meloxicam (MOBIC) 15 MG tablet Take 15 mg  by mouth every morning. 03/31/16   [provider]  Omega-3 Fatty Acids (FISH OIL) 1000 MG CAPS Take 1,000 mg by mouth at bedtime.    [provider]  traMADol (ULTRAM) 50 MG tablet Take 50-100 mg by mouth every 6 (six) hours as needed for moderate pain or severe pain.    [provider]   Meds Ordered and Administered this Visit   Medications   ketorolac (TORADOL) injection 60 mg (60 mg Intramuscular Given 09/19/16 1730)  metoCLOPramide (REGLAN) injection 5 mg ( Intramuscular Given 09/19/16 1733)  dexamethasone (DECADRON) injection 10 mg (10 mg Intramuscular Given 09/19/16 1734)  SUMAtriptan (IMITREX) injection 6 mg (6 mg Subcutaneous Given 09/19/16 1728)    BP 99/71 (BP Location: Right Arm)   Pulse 84   Temp 97.9 F (36.6 C) (Oral)   Resp 18   SpO2 99%  No data found.   Physical Exam  Constitutional: He is oriented to person, place, and time. He appears well-developed and well-nourished. No distress.  HENT:  Head: Normocephalic and atraumatic.  Right Ear: External ear normal.  Left Ear: External ear normal.  Mouth/Throat: Oropharynx is clear and moist.  Eyes: Conjunctivae and EOM are normal. Pupils are equal, round, and reactive to light. Right eye exhibits no discharge. Left eye exhibits no discharge. No scleral icterus.  Neck: Normal range of motion. Neck supple.  Negative nuchal rigidity.  Cardiovascular: Normal rate, regular rhythm, normal heart sounds and intact distal pulses.  Exam reveals no gallop and no friction rub.   No murmur heard. Pulmonary/Chest: Effort normal and breath sounds normal. No respiratory distress. He has no wheezes. He has no rales. He exhibits no tenderness.  Musculoskeletal: Normal range of motion.  Strength 5/5 x all 4 extremities.   Neurological: He is alert and oriented to person, place, and time. He displays normal reflexes. No cranial nerve deficit or sensory deficit. He exhibits normal muscle tone. Coordination normal.   Cranial nerves II through XII through 12 grossly intact.   Skin: Skin is warm and dry. Capillary refill takes less than 2 seconds. He is not diaphoretic.  Nursing note and vitals reviewed.   Urgent Care Course     Procedures (including critical care time)  Labs Review Labs Reviewed  POCT I-STAT, CHEM 8 - Abnormal; Notable for the following:       Result Value    BUN 31 (*)    Creatinine, Ser 1.40 (*)    All other components within normal limits   Results for orders placed or performed during the hospital encounter of 09/19/16  I-STAT, chem 8  Result Value Ref Range   Sodium 137 135 - 145 mmol/L   Potassium 4.1 3.5 - 5.1 mmol/L   Chloride 106 101 - 111 mmol/L   BUN 31 (H) 6 - 20 mg/dL   Creatinine, Ser 1.40 (H) 0.61 - 1.24 mg/dL   Glucose, Bld 75 65 - 99 mg/dL   Calcium, Ion 1.23 1.15 - 1.40 mmol/L   TCO2 23 0 - 100 mmol/L   Hemoglobin 15.6 13.0 - 17.0 g/dL   HCT 46.0 39.0 - 52.0 %    Imaging Review No results found.  Patient requested bloodwork after headache treatment stating his old doctor had told him he had some abnormalities, he wasn't sure what.  Advised patient and wife of elevation in BUN and creatinine.  Told to call [facility] Tuesday am to set up appointment for follow up asap.  Advised Dr. Joseph Art.  MDM   1. Intractable migraine without status migrainosus, unspecified migraine type    Meds ordered this encounter  Medications  . ketorolac (TORADOL) injection 60 mg  . metoCLOPramide (REGLAN) injection 5 mg  . dexamethasone (DECADRON) injection 10 mg  . SUMAtriptan (IMITREX) injection 6 mg   The usual and customary discharge instructions and warnings were given.  The patient and his wife verbalized understanding and agrees to plan of care.       Nehemiah Settle, NP 09/19/16 1807    Nehemiah Settle, NP 09/19/16 [phone]

## 2016-09-19 NOTE — ED Triage Notes (Signed)
The patient presented to the [facility] with a complaint of a headache x 3 days. The patient reported a previous hx of migraines.

## 2016-09-28 ENCOUNTER — Inpatient Hospital Stay (HOSPITAL_COMMUNITY)
Admission: AD | Admit: 2016-09-28 | Discharge: 2016-10-02 | DRG: 885 | Disposition: A | Discharge status: Home / Self Care | Payer: Medicare Other | Source: Intra-hospital | Attending: Psychiatry | Admitting: Psychiatry

## 2016-09-28 ENCOUNTER — Encounter (HOSPITAL_COMMUNITY): Payer: Self-pay | Admitting: Emergency Medicine

## 2016-09-28 ENCOUNTER — Emergency Department (HOSPITAL_COMMUNITY)
Admission: EM | Admit: 2016-09-28 | Discharge: 2016-09-28 | Disposition: A | Discharge status: Other Acute Inpatient Hospital | Payer: Medicare Other | Attending: Emergency Medicine | Admitting: Emergency Medicine

## 2016-09-28 ENCOUNTER — Encounter (HOSPITAL_COMMUNITY): Payer: Self-pay

## 2016-09-28 DIAGNOSIS — G8929 Other chronic pain: Secondary | ICD-10-CM | POA: Diagnosis present

## 2016-09-28 DIAGNOSIS — F319 Bipolar disorder, unspecified: Secondary | ICD-10-CM | POA: Diagnosis not present

## 2016-09-28 DIAGNOSIS — E119 Type 2 diabetes mellitus without complications: Secondary | ICD-10-CM | POA: Diagnosis present

## 2016-09-28 DIAGNOSIS — Z560 Unemployment, unspecified: Secondary | ICD-10-CM

## 2016-09-28 DIAGNOSIS — M549 Dorsalgia, unspecified: Secondary | ICD-10-CM | POA: Diagnosis present

## 2016-09-28 DIAGNOSIS — Z046 Encounter for general psychiatric examination, requested by authority: Secondary | ICD-10-CM | POA: Diagnosis present

## 2016-09-28 DIAGNOSIS — J45909 Unspecified asthma, uncomplicated: Secondary | ICD-10-CM | POA: Diagnosis present

## 2016-09-28 DIAGNOSIS — Z7951 Long term (current) use of inhaled steroids: Secondary | ICD-10-CM | POA: Diagnosis not present

## 2016-09-28 DIAGNOSIS — E871 Hypo-osmolality and hyponatremia: Secondary | ICD-10-CM | POA: Diagnosis present

## 2016-09-28 DIAGNOSIS — F250 Schizoaffective disorder, bipolar type: Principal | ICD-10-CM | POA: Diagnosis present

## 2016-09-28 DIAGNOSIS — Z9114 Patient's other noncompliance with medication regimen: Secondary | ICD-10-CM | POA: Diagnosis not present

## 2016-09-28 DIAGNOSIS — I10 Essential (primary) hypertension: Secondary | ICD-10-CM | POA: Insufficient documentation

## 2016-09-28 DIAGNOSIS — Z79899 Other long term (current) drug therapy: Secondary | ICD-10-CM | POA: Diagnosis not present

## 2016-09-28 DIAGNOSIS — F411 Generalized anxiety disorder: Secondary | ICD-10-CM | POA: Diagnosis present

## 2016-09-28 DIAGNOSIS — F17210 Nicotine dependence, cigarettes, uncomplicated: Secondary | ICD-10-CM | POA: Diagnosis present

## 2016-09-28 DIAGNOSIS — Z888 Allergy status to other drugs, medicaments and biological substances status: Secondary | ICD-10-CM | POA: Diagnosis not present

## 2016-09-28 DIAGNOSIS — F1220 Cannabis dependence, uncomplicated: Secondary | ICD-10-CM | POA: Diagnosis present

## 2016-09-28 DIAGNOSIS — Z008 Encounter for other general examination: Secondary | ICD-10-CM

## 2016-09-28 DIAGNOSIS — F2081 Schizophreniform disorder: Secondary | ICD-10-CM | POA: Diagnosis not present

## 2016-09-28 DIAGNOSIS — F1290 Cannabis use, unspecified, uncomplicated: Secondary | ICD-10-CM | POA: Diagnosis not present

## 2016-09-28 DIAGNOSIS — F29 Unspecified psychosis not due to a substance or known physiological condition: Secondary | ICD-10-CM | POA: Diagnosis present

## 2016-09-28 DIAGNOSIS — E876 Hypokalemia: Secondary | ICD-10-CM | POA: Diagnosis present

## 2016-09-28 DIAGNOSIS — Z7984 Long term (current) use of oral hypoglycemic drugs: Secondary | ICD-10-CM | POA: Insufficient documentation

## 2016-09-28 DIAGNOSIS — G4700 Insomnia, unspecified: Secondary | ICD-10-CM | POA: Diagnosis present

## 2016-09-28 HISTORY — DX: Generalized anxiety disorder: F41.1

## 2016-09-28 HISTORY — DX: Schizoaffective disorder, unspecified: F25.9

## 2016-09-28 HISTORY — DX: Cannabis dependence, uncomplicated: F12.20

## 2016-09-28 LAB — COMPREHENSIVE METABOLIC PANEL
ALK PHOS: 61 U/L (ref 38–126)
ALT: 29 U/L (ref 17–63)
ANION GAP: 12 (ref 5–15)
AST: 64 U/L — ABNORMAL HIGH (ref 15–41)
Albumin: 4 g/dL (ref 3.5–5.0)
BILIRUBIN TOTAL: 1 mg/dL (ref 0.3–1.2)
BUN: 20 mg/dL (ref 6–20)
CALCIUM: 9.2 mg/dL (ref 8.9–10.3)
CO2: 17 mmol/L — AB (ref 22–32)
Chloride: 103 mmol/L (ref 101–111)
Creatinine, Ser: 1.52 mg/dL — ABNORMAL HIGH (ref 0.61–1.24)
GFR calc non Af Amer: 49 mL/min — ABNORMAL LOW (ref >60)
GFR, EST AFRICAN AMERICAN: 57 mL/min — AB (ref >60)
Glucose, Bld: 92 mg/dL (ref 65–99)
POTASSIUM: 3.3 mmol/L — AB (ref 3.5–5.1)
SODIUM: 132 mmol/L — AB (ref 135–145)
TOTAL PROTEIN: 6.7 g/dL (ref 6.5–8.1)

## 2016-09-28 LAB — SALICYLATE LEVEL

## 2016-09-28 LAB — CBC
HCT: 38.3 % — ABNORMAL LOW (ref 39.0–52.0)
Hemoglobin: 13.4 g/dL (ref 13.0–17.0)
MCH: 31.9 pg (ref 26.0–34.0)
MCHC: 35 g/dL (ref 30.0–36.0)
MCV: 91.2 fL (ref 78.0–100.0)
PLATELETS: 205 10*3/uL (ref 150–400)
RBC: 4.2 MIL/uL — ABNORMAL LOW (ref 4.22–5.81)
RDW: 14.4 % (ref 11.5–15.5)
WBC: 8.6 10*3/uL (ref 4.0–10.5)

## 2016-09-28 LAB — ETHANOL: Alcohol, Ethyl (B): <5 mg/dL (ref <5)

## 2016-09-28 LAB — RAPID URINE DRUG SCREEN, HOSP PERFORMED
Amphetamines: NOT DETECTED
Barbiturates: NOT DETECTED
Benzodiazepines: POSITIVE — AB
COCAINE: NOT DETECTED
OPIATES: NOT DETECTED
Tetrahydrocannabinol: POSITIVE — AB

## 2016-09-28 LAB — ACETAMINOPHEN LEVEL

## 2016-09-28 MED ORDER — ZIPRASIDONE HCL 20 MG PO CAPS
20.0000 mg | ORAL_CAPSULE | Freq: Two times a day (BID) | ORAL | Status: DC
  Administered 2016-09-29: 20 mg via ORAL
  Filled 2016-09-28 (×4): qty 1

## 2016-09-28 MED ORDER — MOMETASONE FURO-FORMOTEROL FUM 200-5 MCG/ACT IN AERO
2.0000 | INHALATION_SPRAY | Freq: Two times a day (BID) | RESPIRATORY_TRACT | Status: DC
  Administered 2016-09-29 – 2016-10-02 (×7): 2 via RESPIRATORY_TRACT
  Filled 2016-09-28 (×2): qty 8.8

## 2016-09-28 MED ORDER — HYDROCHLOROTHIAZIDE 25 MG PO TABS
25.0000 mg | ORAL_TABLET | ORAL | Status: DC
  Administered 2016-09-29 – 2016-10-02 (×4): 25 mg via ORAL
  Filled 2016-09-28 (×7): qty 1

## 2016-09-28 MED ORDER — HYDROCHLOROTHIAZIDE 25 MG PO TABS
25.0000 mg | ORAL_TABLET | ORAL | Status: DC
  Administered 2016-09-28: 25 mg via ORAL
  Filled 2016-09-28: qty 1

## 2016-09-28 MED ORDER — GLIPIZIDE 10 MG PO TABS
10.0000 mg | ORAL_TABLET | Freq: Two times a day (BID) | ORAL | Status: DC
  Administered 2016-09-29 – 2016-10-02 (×7): 10 mg via ORAL
  Filled 2016-09-28: qty 1
  Filled 2016-09-28 (×2): qty 2

## 2016-09-28 MED ORDER — ALBUTEROL SULFATE (2.5 MG/3ML) 0.083% IN NEBU: 2.5000 mg | INHALATION_SOLUTION | Freq: Four times a day (QID) | RESPIRATORY_TRACT | Status: DC | PRN

## 2016-09-28 MED ORDER — ALUM & MAG HYDROXIDE-SIMETH 200-200-20 MG/5ML PO SUSP: 30.0000 mL | ORAL | Status: DC | PRN

## 2016-09-28 MED ORDER — LISINOPRIL 20 MG PO TABS
40.0000 mg | ORAL_TABLET | ORAL | Status: DC
  Administered 2016-09-28: 40 mg via ORAL
  Filled 2016-09-28: qty 2

## 2016-09-28 MED ORDER — NICOTINE POLACRILEX 2 MG MT GUM
2.0000 mg | CHEWING_GUM | OROMUCOSAL | Status: DC | PRN
  Administered 2016-09-29 – 2016-10-02 (×5): 2 mg via ORAL
  Filled 2016-09-28 (×6): qty 1

## 2016-09-28 MED ORDER — HYDROXYZINE HCL 50 MG PO TABS
50.0000 mg | ORAL_TABLET | Freq: Three times a day (TID) | ORAL | Status: DC | PRN
  Administered 2016-09-28 – 2016-10-01 (×5): 50 mg via ORAL
  Filled 2016-09-28 (×5): qty 1

## 2016-09-28 MED ORDER — TRAZODONE HCL 50 MG PO TABS
50.0000 mg | ORAL_TABLET | Freq: Every evening | ORAL | Status: DC | PRN
  Administered 2016-09-28 – 2016-10-01 (×6): 50 mg via ORAL
  Filled 2016-09-28 (×5): qty 1

## 2016-09-28 MED ORDER — DIVALPROEX SODIUM ER 500 MG PO TB24
1500.0000 mg | ORAL_TABLET | Freq: Every day | ORAL | Status: DC
  Filled 2016-09-28 (×2): qty 3

## 2016-09-28 MED ORDER — ZIPRASIDONE HCL 20 MG PO CAPS
20.0000 mg | ORAL_CAPSULE | Freq: Two times a day (BID) | ORAL | Status: DC
  Administered 2016-09-28: 20 mg via ORAL
  Filled 2016-09-28 (×2): qty 1

## 2016-09-28 MED ORDER — LORAZEPAM 2 MG/ML IJ SOLN
1.0000 mg | Freq: Once | INTRAMUSCULAR | Status: AC
Start: 2016-09-28 — End: 2016-09-28

## 2016-09-28 MED ORDER — MAGNESIUM HYDROXIDE 400 MG/5ML PO SUSP: 30.0000 mL | Freq: Every day | ORAL | Status: DC | PRN

## 2016-09-28 MED ORDER — ACETAMINOPHEN 325 MG PO TABS
650.0000 mg | ORAL_TABLET | Freq: Four times a day (QID) | ORAL | Status: DC | PRN
  Administered 2016-09-29 – 2016-10-01 (×7): 650 mg via ORAL
  Filled 2016-09-28 (×8): qty 2

## 2016-09-28 MED ORDER — TRAMADOL HCL 50 MG PO TABS: 50.0000 mg | ORAL_TABLET | Freq: Four times a day (QID) | ORAL | Status: DC | PRN

## 2016-09-28 MED ORDER — OMEGA-3-ACID ETHYL ESTERS 1 G PO CAPS: 1000.0000 mg | ORAL_CAPSULE | Freq: Every day | ORAL | Status: DC

## 2016-09-28 MED ORDER — DIVALPROEX SODIUM ER 500 MG PO TB24
1500.0000 mg | ORAL_TABLET | Freq: Every day | ORAL | Status: DC
  Administered 2016-09-28: 1500 mg via ORAL
  Filled 2016-09-28 (×3): qty 3

## 2016-09-28 MED ORDER — MELOXICAM 15 MG PO TABS: 15.0000 mg | ORAL_TABLET | Freq: Every day | ORAL | Status: DC

## 2016-09-28 MED ORDER — LISINOPRIL 40 MG PO TABS
40.0000 mg | ORAL_TABLET | ORAL | Status: DC
  Administered 2016-09-29 – 2016-10-02 (×4): 40 mg via ORAL
  Filled 2016-09-28 (×2): qty 1
  Filled 2016-09-28: qty 2
  Filled 2016-09-28 (×4): qty 1

## 2016-09-28 MED ORDER — GLIPIZIDE 10 MG PO TABS
10.0000 mg | ORAL_TABLET | Freq: Two times a day (BID) | ORAL | Status: DC
  Administered 2016-09-28: 10 mg via ORAL
  Filled 2016-09-28: qty 1

## 2016-09-28 MED ORDER — MOMETASONE FURO-FORMOTEROL FUM 200-5 MCG/ACT IN AERO
2.0000 | INHALATION_SPRAY | Freq: Two times a day (BID) | RESPIRATORY_TRACT | Status: DC
  Administered 2016-09-28: 2 via RESPIRATORY_TRACT
  Filled 2016-09-28: qty 8.8

## 2016-09-28 MED ORDER — ASENAPINE MALEATE 5 MG SL SUBL
10.0000 mg | SUBLINGUAL_TABLET | Freq: Every day | SUBLINGUAL | Status: DC
  Filled 2016-09-28: qty 2

## 2016-09-28 MED ORDER — LORAZEPAM 1 MG PO TABS
1.0000 mg | ORAL_TABLET | Freq: Once | ORAL | Status: AC
Start: 2016-09-28 — End: 2016-09-28
  Administered 2016-09-28: 1 mg via ORAL
  Filled 2016-09-28: qty 1

## 2016-09-28 NOTE — BH Assessment (Addendum)
Tele Assessment Note   Joshua [name] is an 61 y.o. male who reports to the ED with his family due to delusional behavior.  Pt appears psychotic as evidenced by the pt stating he is [name] and is here to save the world.  Pt denies SI/HI and does not endorse AVH.  Pt's wife stated the pt became delusional after attending urgent care and last week on 09/19/16.  Pt lives at home with his family and can return. Pt family expressed great concern about his behaviors.  Pt denied having any criminal activity; however did state he used drugs.  Due to pt being so incoherent and hyperactive he was not able to elaborate; however, the screening tested positive for THC, Benzos, and Amphetamines.  Pt. Presented with bizarre behaviors, had poor eye contact and freedom of movement.  Pt's speech was logical, he was alert, and his mood was Euphoric and grandiose.  Pt judgement appeared impaired.  He was orientation was not able to be access.  Pt does meet criteria for inpatient services.  Diagnosis: Delusional disorder  Past Medical History:  Past Medical History:  Diagnosis Date  . Back pain   . Bipolar disorder ([place])   . Chronic pain   . Hypertension     Past Surgical History:  Procedure Laterality Date  . APPENDECTOMY      Family History: No family history on file.  Social History:  reports that he has been smoking Cigarettes.  He has been smoking about 2.00 packs per day. He has never used smokeless tobacco. He reports that he drinks alcohol. He reports that he uses drugs, including Marijuana.  Additional Social History:  Alcohol / Drug Use Pain Medications: See MAR Prescriptions: See MAR Over the Counter: See MAR History of alcohol / drug use?: Yes Substance #1 Name of Substance 1: Marijuana (UTA) 1 - Age of First Use: 14  CIWA: CIWA-Ar BP: 109/87 Pulse Rate: (!) 117 COWS:    PATIENT STRENGTHS: (choose at least two) Communication skills Supportive family/friends  Allergies: No  Known Allergies  Home Medications:  (Not in a hospital admission)  OB/GYN Status:  No LMP for male patient.  General Assessment Data Location of Assessment: [facility] ED TTS Assessment: In system Is this a Tele or Face-to-Face Assessment?: Tele Assessment Is this an Initial Assessment or a Re-assessment for this encounter?: Initial Assessment Marital status: Married Living Arrangements: Spouse/significant other, Children Can pt return to current living arrangement?: Yes Admission Status: Voluntary Is patient capable of signing voluntary admission?: Yes Referral Source: Self/Family/Friend Insurance type: Medicare     Crisis Care Plan Living Arrangements: Spouse/significant other, Children Legal Guardian: Other: (Self) Name of Psychiatrist: Warden/ranger Name of Therapist: Cannot recall  Education Status Is patient currently in school?: No Highest grade of school patient has completed: 7th  Risk to self with the past 6 months Suicidal Ideation: No Has patient been a risk to self within the past 6 months prior to admission? : No Suicidal Intent: No Has patient had any suicidal intent within the past 6 months prior to admission? : No Is patient at risk for suicide?: No Suicidal Plan?: No Has patient had any suicidal plan within the past 6 months prior to admission? : No Access to Means: No What has been your use of drugs/alcohol within the last 12 months?: yes Previous Attempts/Gestures: No How many times?: 0 Other Self Harm Risks: None reported Triggers for Past Attempts: None known Intentional Self Injurious Behavior: None Family Suicide History: No  Recent stressful life event(s): Trauma (Comment), Other (Comment) Persecutory voices/beliefs?:  (UTA) Depression: Yes Depression Symptoms: Tearfulness, Isolating, Feeling worthless/self pity, Feeling angry/irritable Substance abuse history and/or treatment for substance abuse?: Yes Suicide prevention information given to non-admitted  patients: Not applicable  Risk to Others within the past 6 months Homicidal Ideation: No Does patient have any lifetime risk of violence toward others beyond the six months prior to admission? : No Thoughts of Harm to Others: No Current Homicidal Intent: No Current Homicidal Plan: No Access to Homicidal Means: No Identified Victim: NA History of harm to others?: No Assessment of Violence: None Noted Violent Behavior Description: No Does patient have access to weapons?: No Criminal Charges Pending?: No Does patient have a court date: No Is patient on probation?: No  Psychosis Hallucinations: None noted Delusions: Grandiose  Mental Status Report Appearance/Hygiene: Bizarre Eye Contact: Poor Motor Activity: Freedom of movement, Gestures, Hyperactivity Speech: Logical/coherent Level of Consciousness: Alert, Drowsy Mood: Euphoric, Elated Affect: Euphoric Anxiety Level: Moderate Thought Processes: Tangential Judgement: Impaired Orientation: Unable to assess Obsessive Compulsive Thoughts/Behaviors: None  Cognitive Functioning Concentration: Normal Memory: Recent Intact, Remote Intact IQ: Average Insight: see judgement above Impulse Control: Poor Appetite: Poor Weight Loss: 0 Weight Gain: 0 Sleep: No Change Total Hours of Sleep:  (unable to access) Vegetative Symptoms: None  ADLScreening [facility] Assessment Services) Patient's cognitive ability adequate to safely complete daily activities?: Yes Patient able to express need for assistance with ADLs?: Yes Independently performs ADLs?: Yes (appropriate for developmental age)  Prior Inpatient Therapy Prior Inpatient Therapy: Yes  Prior Outpatient Therapy Prior Outpatient Therapy: Yes Prior Therapy Dates:  [facility]) Prior Therapy Facilty/Provider(s):  [facility]) Reason for Treatment:  [facility]) Does patient have an ACCT team?: No Does patient have Intensive In-House Services?  : No Does patient have [facility] services? : Yes Does  patient have P4CC services?: No  ADL Screening (condition at time of admission) Patient's cognitive ability adequate to safely complete daily activities?: Yes Patient able to express need for assistance with ADLs?: Yes Independently performs ADLs?: Yes (appropriate for developmental age)       Abuse/Neglect Assessment (Assessment to be complete while patient is alone) Physical Abuse:  (UTA) Verbal Abuse: Denies Sexual Abuse: Denies Exploitation of patient/patient's resources: Denies Self-Neglect: Denies Values / Beliefs Cultural Requests During Hospitalization: None (Do not pork) Spiritual Requests During Hospitalization: None Consults Spiritual Care Consult Needed: No Social Work Consult Needed: No Regulatory affairs officer (For Healthcare) Does Patient Have a Medical Advance Directive?: No    Additional Information 1:1 In Past 12 Months?: No CIRT Risk: No Elopement Risk: No Does patient have medical clearance?: No     Disposition:  Disposition Initial Assessment Completed for this Encounter: Yes Disposition of Patient: Inpatient treatment program (Per Patriciaann Clan) Type of inpatient treatment program: Adult  Lazaro Arms [facility] 09/28/2016 4:15 AM

## 2016-09-28 NOTE — ED Notes (Signed)
Pt verbalized understanding and signed consent forms for [facility] - copy faxed to [facility], copy sent to medical records, and original placed in envelope for [facility]. Pt states "I'm happy I'm going there. I need to be evaluated. I've been bad."

## 2016-09-28 NOTE — ED Notes (Signed)
Pt noted to be wearing ring on left 4th finger - gray in color.

## 2016-09-28 NOTE — ED Notes (Addendum)
[place] pt wife requesting update if pt moved anywhere.   Pt wife took all of pt belongings home with her.

## 2016-09-28 NOTE — ED Notes (Signed)
Pt standing in doorway of room - talking w/intelligible speech. Pt is making bizarre movements w/hands and body. Security standing by.

## 2016-09-28 NOTE — Progress Notes (Signed)
Adult Psychoeducational Group Note  Date:  09/28/2016 Time:  9:57 PM  Group Topic/Focus:  Wrap-Up Group:   The focus of this group is to help patients review their daily goal of treatment and discuss progress on daily workbooks.  Participation Level:  Active  Participation Quality:  Appropriate  Affect:  Appropriate  Cognitive:  Alert  Insight: Appropriate  Engagement in Group:  Engaged  Modes of Intervention:  Discussion  Additional Comments:  Patient's goal for today was to get better and go home.   Joshua [name] L [name] 09/28/2016, 9:57 PM

## 2016-09-28 NOTE — ED Notes (Signed)
Pt changed into maroon scrubs, wanded by security, belongings given to family.

## 2016-09-28 NOTE — ED Notes (Signed)
Pt took meds after performing what appeared to be ritualistic gestures. Pt cooperative w/staff and is easily directed.

## 2016-09-28 NOTE — ED Notes (Addendum)
Pt accepted to [facility] - 506-[facility] - Dr Shea Evans - medicate prior w/meds ordered by [facility] NP and may arrive 1630-1700.

## 2016-09-28 NOTE — ED Notes (Signed)
Pt lying in bed, alert. Pt intermittently gets up from bed, ambulates to doorway and performs gestures and talks to himself in words unable for staff to understand. Pt then returns to bed.

## 2016-09-28 NOTE — Progress Notes (Signed)
Patient ID: Joshua [name], male   DOB: 14-Apr-1956, 61 y.o.   MRN: [id number] PER STATE REGULATIONS 482.30  THIS CHART WAS REVIEWED FOR MEDICAL NECESSITY WITH RESPECT TO THE PATIENT'S ADMISSION/DURATION OF STAY.  NEXT REVIEW DATE:10/02/16  Roma Schanz, RN, BSN CASE MANAGER

## 2016-09-28 NOTE — ED Notes (Signed)
Spoke with Harmon Pier from [facility] who states pt meets inpatient criteria and is awaiting placement at this time.

## 2016-09-28 NOTE — Progress Notes (Signed)
Nursing Progress Note: 7p-7a D: Pt currently presents with a pleasant/childlike/magical thinking/religiousity/euphoric affect and behavior. Pt states "I believe in spirits. Do you? I never want to hurt people. I used to back when I had the devil in me." Interacting appropriately with milieu. Pt reports poor sleep before admission.   A: Pt provided with medications per providers orders. Pt's labs and vitals were monitored throughout the night. Pt supported emotionally and encouraged to express concerns and questions. Pt educated on medications.  R: Pt's safety ensured with 15 minute and environmental checks. Pt currently denies SI/HI/Self Harm and AVH. Pt verbally contracts to seek staff if SI/HI or A/VH occurs and to consult with staff before acting on any harmful thoughts. Will continue to monitor.

## 2016-09-28 NOTE — ED Notes (Signed)
Patient ate only his Rice Krispy Treat, and Drink his Sprite, But  didn't eat anything else, he Stated that's all he wanted.

## 2016-09-28 NOTE — ED Notes (Signed)
A Regular Diet ordered for Dinner, and A [place] Sandwich bag was given to patient w/ a Sprite.

## 2016-09-28 NOTE — ED Notes (Signed)
Pt gave RN his tooth that broke - states he wants to keep it - placed in small clear plastic bag - labeled - placed in envelope for [facility].

## 2016-09-28 NOTE — ED Provider Notes (Signed)
[facility] DEPT Provider Note   CSN: [id number] Arrival date & time: 09/28/16  0016     History   Chief Complaint Chief Complaint  Patient presents with  . Medical Clearance    HPI Joshua [name] is a 61 y.o. male.  This a 61 year old male with a history of bipolar, schizoaffective disorder, hypertension and diabetes who presents with several days of bizarre behavior. He states he is off his medicine for a week to 2, but recently restarted it.  With the help of his family. Family reports that tonight he left to go to [place] and they found him several hours later sitting in the parking lot he had emptied his car onto the pavement of the parking lot.  He cut.  Several holes in the posterior he he tossed money out onto the street. Wife states that he has not slept in 3-4 days.  He's also not eating.  He states he doesn't like the taste of food.  Any longer. Denies  SI or HI.      Past Medical History:  Diagnosis Date  . Back pain   . Bipolar disorder ([place])   . Cannabis use disorder, moderate, dependence ([place])   . Chronic pain   . Generalized anxiety disorder   . Hypertension   . Schizoaffective disorder [facility])     Patient Active Problem List   Diagnosis Date Noted  . 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 Schizoaffective disorder, bipolar type ([place]) 05/10/2016  . Cannabis use disorder, moderate, dependence ([place]) 05/10/2016  . Bipolar affective disorder, manic, severe ([place]) 05/04/2016  . Generalized anxiety disorder 05/04/2016    Past Surgical History:  Procedure Laterality Date  . APPENDECTOMY         Home Medications    Prior to Admission medications   Medication Sig Start Date End Date Taking? Authorizing Provider  ADVAIR DISKUS 250-50 MCG/DOSE AEPB Inhale 1 puff into the lungs 2 (two) times daily. 09/22/16  Yes [provider]  divalproex (DEPAKOTE ER) 500 MG 24 hr tablet Take 3 tablets (1,500 mg total) by mouth at bedtime. 05/05/16  Yes Patrecia Pour,  NP  ferrous sulfate 325 (65 FE) MG tablet Take 325 mg by mouth daily with breakfast.   Yes [provider]  glipiZIDE (GLUCOTROL) 10 MG tablet Take 1 tablet (10 mg total) by mouth 2 (two) times daily. 05/05/16  Yes Patrecia Pour, NP  hydrochlorothiazide (HYDRODIURIL) 25 MG tablet Take 1 tablet (25 mg total) by mouth every morning. 05/05/16  Yes Patrecia Pour, NP  lisinopril (PRINIVIL,ZESTRIL) 40 MG tablet Take 1 tablet (40 mg total) by mouth every morning. 05/05/16  Yes Patrecia Pour, NP  risperiDONE (RISPERDAL) 1 MG tablet Take 1 mg by mouth at bedtime.   Yes [provider]  traMADol (ULTRAM) 50 MG tablet Take 50-100 mg by mouth every 6 (six) hours as needed for moderate pain or severe pain.   Yes [provider]  VENTOLIN HFA 108 (90 Base) MCG/ACT inhaler Inhale 1-2 puffs into the lungs every 6 (six) hours as needed for shortness of breath.  09/21/16  Yes [provider]    Family History No family history on file.  Social History Social History  Substance Use Topics  . Smoking status: Current Every Day Smoker    Packs/day: 2.00    Types: Cigarettes  . Smokeless tobacco: Never Used  . Alcohol use Yes     Comment: occ     Allergies  Imitrex [sumatriptan]   Review of Systems Review of Systems  Constitutional: Negative for fever.  Respiratory: Negative for shortness of breath.   Cardiovascular: Negative for chest pain.  Gastrointestinal: Negative for abdominal pain.  Neurological: Negative for headaches.  Psychiatric/Behavioral: Positive for sleep disturbance. Negative for agitation, behavioral problems and suicidal ideas.  All other systems reviewed and are negative.    Physical Exam Updated Vital Signs BP 100/81 (BP Location: Right Arm)   Pulse 90   Temp 97.8 F (36.6 C) (Oral)   Resp 18   SpO2 99%   Physical Exam  Constitutional: He appears well-developed and well-nourished. No distress.  HENT:  Head: Normocephalic.   Eyes: Pupils are equal, round, and reactive to light.  Neck: Normal range of motion.  Cardiovascular: Regular rhythm.   Pulmonary/Chest: Effort normal.  Abdominal: Soft.  Musculoskeletal: Normal range of motion.  Neurological: He is alert.  Skin: Skin is warm.  Psychiatric: His speech is normal. His affect is labile. Thought content is delusional. Cognition and memory are impaired. He expresses inappropriate judgment. He expresses no suicidal plans and no homicidal plans. He is inattentive.  Nursing note and vitals reviewed.    ED Treatments / Results  Labs (all labs ordered are listed, but only abnormal results are displayed) Labs Reviewed  COMPREHENSIVE METABOLIC PANEL - Abnormal; Notable for the following:       Result Value   Sodium 132 (*)    Potassium 3.3 (*)    CO2 17 (*)    Creatinine, Ser 1.52 (*)    AST 64 (*)    GFR calc non Af Amer 49 (*)    GFR calc Af Amer 57 (*)    All other components within normal limits  ACETAMINOPHEN LEVEL - Abnormal; Notable for the following:    Acetaminophen (Tylenol), Serum <10 (*)    All other components within normal limits  CBC - Abnormal; Notable for the following:    RBC 4.20 (*)    HCT 38.3 (*)    All other components within normal limits  RAPID URINE DRUG SCREEN, HOSP PERFORMED - Abnormal; Notable for the following:    Benzodiazepines POSITIVE (*)    Tetrahydrocannabinol POSITIVE (*)    All other components within normal limits  ETHANOL  SALICYLATE LEVEL    EKG  EKG Interpretation None       Radiology No results found.  Procedures Procedures (including critical care time)  Medications Ordered in ED Medications  LORazepam (ATIVAN) tablet 1 mg (1 mg Oral Given 09/28/16 1552)    Or  LORazepam (ATIVAN) injection 1 mg ( Intramuscular See Alternative 09/28/16 1552)     Initial Impression / Assessment and Plan / ED Course  I have reviewed the triage vital signs and the nursing notes.  Pertinent labs & imaging  results that were available during my care of the patient were reviewed by me and considered in my medical decision making (see chart for details).     We'll have patient evaluated by TTS  Final Clinical Impressions(s) / ED Diagnoses   Final diagnoses:  Medical clearance for psychiatric admission  Bipolar 1 disorder ([place])  Schizophreniform disorder [facility])    New Prescriptions Discharge Medication List as of 09/28/2016  4:48 PM       Junius Creamer, NP 09/28/16 0149    Junius Creamer, NP 09/28/16 2019    Ripley Fraise, MD 09/29/16 [id number]

## 2016-09-28 NOTE — ED Notes (Signed)
Pt's right hand noted w/scab that came off - slight bleeding noted - strip bandage applied.

## 2016-09-28 NOTE — ED Notes (Signed)
Pt on phone w/spouse at nurses' desk. Pt asking for her to visit - assisting pt to give her visitation times.

## 2016-09-28 NOTE — ED Notes (Signed)
TTS in process 

## 2016-09-28 NOTE — ED Notes (Signed)
Patient was given a snack and drink, and A regular Diet was Ordered for Lunch.

## 2016-09-28 NOTE — ED Triage Notes (Signed)
Pt to ED with his wife and son.  Pt st's he is [employer]. And has came to stay here for a few days.  Wife st's pt left to go to [place] and never returned home.  St's they found him sitting in parking lot.

## 2016-09-28 NOTE — ED Notes (Signed)
Family at bedside with pt at this time awaiting TTS consult. Staffing called and reports sitter to arrive at 0300.

## 2016-09-28 NOTE — Tx Team (Signed)
Initial Treatment Plan 09/28/2016 6:27 PM [place] WTU:[id number]    PATIENT STRESSORS: Health problems Substance abuse Other: Trouble sleeping   PATIENT STRENGTHS: Scientist, research (life sciences) Religious Affiliation Supportive family/friends   PATIENT IDENTIFIED PROBLEMS: Psychosis  "To get better"  "Straighten up"  Treat my family right and go back to church"               DISCHARGE CRITERIA:  Ability to meet basic life and health needs Improved stabilization in mood, thinking, and/or behavior Motivation to continue treatment in a less acute level of care Verbal commitment to aftercare and medication compliance  PRELIMINARY DISCHARGE PLAN: Outpatient therapy Medication management  PATIENT/FAMILY INVOLVEMENT: This treatment plan has been presented to and reviewed with the patient, Joshua [name].  The patient and family have been given the opportunity to ask questions and make suggestions.  Windell Moment, RN 09/28/2016, 6:27 PM

## 2016-09-28 NOTE — ED Notes (Signed)
Pt on phone w/his wife advising of pending [facility] admission. Pt noted to be pleased w/tx plan - stating he wants to go there.

## 2016-09-28 NOTE — Progress Notes (Signed)
Joshua [name] is a 61 year old male being admitted voluntarily to [facility] from [facility].  He came to the ED accompanied by his family for delusional behavior.  He reported that he is [name] and is here to save the world.  He denied SI/HI or A/V hallucinations.  He lives with his family.  Medical issues include chronic pain and hypertension.  He is diagnosed with Delusional Disorder.  He reported that he got to the ED because "I was crazy."  He stated that he hadn't been sleeping good prior to the admission.  He denies SI/HI or A/V hallucinations.  He was religiously preoccupied but pleasant and cooperative.  He was very talkative but easily redirected.  He denied any pain or discomfort and appeared to be in no physical distress.  Oriented him to the unit.  Admission paperwork completed and signed.  Belongings searched and no belongings needing to be locked in locker.  Skin assessment completed and noted bruising both lower arms.  Q 15 minute checks initiated for safety.  We will monitor the progress towards his goals.

## 2016-09-28 NOTE — ED Notes (Signed)
When pt ambulated to bathroom he is noted to try to touch things on the floor with his foot and try to jump over in when there is nothing there. When asked what he sees on the floor pt states "nothing" and shuffles his feet back to his bed.

## 2016-09-28 NOTE — ED Notes (Signed)
Pt continues to act bizarre and speak in unintelligible language except when RN asks pt questions - pt answers questions in [place] language. When RN asked pt to stop moving his legs up and down on bed - pt verbalized understanding and stated "I won't do that anymore". Pt then laid on bed.

## 2016-09-28 NOTE — ED Notes (Signed)
Pt yelling from room "Hello, hello, hola, aloha!" multiple times even after being responded to.

## 2016-09-29 ENCOUNTER — Encounter (HOSPITAL_COMMUNITY): Payer: Self-pay | Admitting: Psychiatry

## 2016-09-29 DIAGNOSIS — E876 Hypokalemia: Secondary | ICD-10-CM | POA: Diagnosis present

## 2016-09-29 DIAGNOSIS — E871 Hypo-osmolality and hyponatremia: Secondary | ICD-10-CM | POA: Diagnosis present

## 2016-09-29 DIAGNOSIS — F250 Schizoaffective disorder, bipolar type: Principal | ICD-10-CM

## 2016-09-29 MED ORDER — OLANZAPINE 5 MG PO TBDP
5.0000 mg | ORAL_TABLET | Freq: Every day | ORAL | Status: DC
  Administered 2016-09-29: 5 mg via ORAL
  Filled 2016-09-29 (×3): qty 1

## 2016-09-29 MED ORDER — OLANZAPINE 5 MG PO TBDP
5.0000 mg | ORAL_TABLET | Freq: Every day | ORAL | Status: DC
  Administered 2016-09-29 – 2016-09-30 (×2): 5 mg via ORAL
  Filled 2016-09-29 (×4): qty 1

## 2016-09-29 NOTE — Progress Notes (Signed)
[place] Group Notes:  (Nursing/MHT/Case Management/Adjunct)  Date:  09/29/2016  Time:  9:18 PM  Type of Therapy:  Psychoeducational Skills  Participation Level:  Active  Participation Quality:  Monopolizing and Redirectable  Affect:  Blunted  Cognitive:  Disorganized  Insight:  Improving  Engagement in Group:  Monopolizing and Off Topic  Modes of Intervention:  Education  Summary of Progress/Problems: The patient expressed in group that he had a "wonderful day" and that he felt better as well. He had to be redirected for wandering off on another tangent but he was able to share that he was "manic" when he first came to the hospital and that he is learning to get along with others. His goal for tomorrow is to get discharged.   Archie Balboa S 09/29/2016, 9:18 PM

## 2016-09-29 NOTE — Progress Notes (Signed)
Recreation Therapy Notes  Date: 09/29/16 Time: 1000 Location: 500 Hall Dayroom  Group Topic: Self-Esteem  Goal Area(s) Addresses:  Patient will identify positive ways to increase self-esteem. Patient will verbalize benefit of increased self-esteem.  Behavioral Response: Minimal  Intervention: Colored pencils, blank crest  Activity: Crest of Arms.  Patients were to fill in the crest with things they value, things they love or anything they feel represents them.  Education:  Self-Esteem, Dentist.   Education Outcome: Acknowledges education/In group clarification offered/Needs additional education  Clinical Observations/Feedback: Pt was rambling about God and people coming back as different things such as animals.  Pt was unable to remain on task and focus.   [name], LRT/CTRS         [name] A 09/29/2016 12:22 PM

## 2016-09-29 NOTE — [facility] Group Notes (Signed)
[place] Group Notes:  (Counselor/Nursing/MHT/Case Management/Adjunct)  09/29/2016 1:15PM  Type of Therapy:  Group Therapy  Participation Level:  Active  Participation Quality:  Appropriate  Affect:  Flat  Cognitive:  Oriented  Insight:  Improving  Engagement in Group:  Limited  Engagement in Therapy:  Limited  Modes of Intervention:  Discussion, Exploration and Socialization  Summary of Progress/Problems: The topic for group was balance in life.  Pt participated in the discussion about when their life was in balance and out of balance and how this feels.  Pt discussed ways to get back in balance and short term goals they can work on to get where they want to be. Stayed for most of group-took a break in the middle-but returned.  "I feel balanced.  I'm listening and I want to learn.  I'm open, not shut down."  Brought up themes of peace and love multiple times, and got up to comfort another patient multiple times.  States he finds balance by "talking to God.  It helps me feel secure and peaceful.  It takes away my worries."   Trish Mage 09/29/2016 4:14 PM

## 2016-09-29 NOTE — Progress Notes (Signed)
  DATA ACTION RESPONSE  Objective- Pt. is visible in the room, seen resting in bed with eyes open. Presents with an animated  affect and mood. Pt. appears somatically focused on pain. Appears restless and hyper-verbal.  Subjective- Denies having any SI/HI/AVH at this time. Rates pain 8/10, lower back. Heat packs given. Pt. states " I want to get out of here soon, I won't smoke Marijuana anymore". Is cooperative and remain safe on the unit.  1:1 interaction in private to establish rapport. Encouragement, education, & support given from staff.  PRN Vistaril, Nicorette, Tylenol and Trazodone  requested and will re-eval accordingly.   Safety maintained with Q 15 checks. Continue with POC.

## 2016-09-29 NOTE — Progress Notes (Signed)
Patient ID: Joshua [name], male   DOB: [birth year], 61 y.o.   MRN: [id number]  DAR: Pt. Denies SI/HI and A/V Hallucinations. He reports sleep is good, appetite is good, energy level is normal, and concentration is good. He rates depression, hopelessness, and anxiety 0/10. He does reports some cravings but otherwise no withdrawal symptoms. Patient does not report any pain or discomfort at this time. Support and encouragement provided to the patient. Scheduled medications administered to patient per physician's orders. Patient is seen in the milieu and is participating in plan of care. Patient is minimal with Probation officer but cooperative. He is able to follow simple directions. Q15 minute checks are maintained for safety.

## 2016-09-29 NOTE — Progress Notes (Signed)
Recreation Therapy Notes  INPATIENT RECREATION THERAPY ASSESSMENT  Patient Details Name: Joshua [name] MRN: [id number] DOB: 21-Mar-1956 Today's Date: 09/29/2016  Patient Stressors: Other (Comment) (Pt stated he felt he was in a battle by himself, believed he was a tracker and thought he was a dog)  Pt stated he was sick and didn't realize it. Pt stated he sees things no one else can.  Coping Skills:   Avoidance, Self-Injury, Exercise, Art/Dance, Talking  Personal Challenges: Communication, Concentration, Decision-Making, Expressing Yourself, Problem-Solving  Leisure Interests (2+):  [employer], Therapist, music - Other (Comment) (Enjoying various activities)  Awareness of Community Resources:  Yes  Community Resources:  Park  Current Use: Yes  Patient Strengths:  Got heart, dignity and respect for others  Patient Identified Areas of Improvement:  Being able to focus more and listen to people  Current Recreation Participation:  2-3 times a week  Patient Goal for Hospitalization:  "Get better to go home"  [place] of Residence:  [place] of Residence:  [place]  Current [place] (including self-harm):  No  Current HI:  No  Consent to Intern Participation: N/A   [name], LRT/CTRS  [name] A 09/29/2016, 2:34 PM

## 2016-09-29 NOTE — Plan of Care (Signed)
Problem: Safety: Goal: Periods of time without injury will increase Outcome: Progressing Pt. remains a low fall risk, denies SI/HI/AVH at this time, Q 15 checks in effect.

## 2016-09-29 NOTE — [facility] Suicide Risk Assessment (Signed)
[facility] Admission Suicide Risk Assessment   Nursing information obtained from:  Patient Demographic factors:  Male, Caucasian Current Mental Status:  NA Loss Factors:  NA Historical Factors:  Impulsivity Risk Reduction Factors:  Sense of responsibility to family, Religious beliefs about death, Living with another person, especially a relative  Total Time spent with patient: 20 minutes Principal Problem: Schizoaffective disorder, bipolar type ([place]) Diagnosis:   Patient Active Problem List   Diagnosis Date Noted  . Hyponatremia [E87.1] 09/29/2016  . Hypokalemia [E87.6] 09/29/2016  . Schizoaffective disorder, bipolar type ([place]) [F25.0] 05/10/2016  . Cannabis use disorder, moderate, dependence ([place]) [F12.20] 05/10/2016  . Generalized anxiety disorder [F41.1] 05/04/2016   Subjective Data: Please see H&P.  Continued Clinical Symptoms:  Alcohol Use Disorder Identification Test Final Score (AUDIT): 1 The "Alcohol Use Disorders Identification Test", Guidelines for Use in Primary Care, Second Edition.  World Pharmacologist [facility]). Score between 0-7:  no or low risk or alcohol related problems. Score between 8-15:  moderate risk of alcohol related problems. Score between 16-19:  high risk of alcohol related problems. Score 20 or above:  warrants further diagnostic evaluation for alcohol dependence and treatment.   CLINICAL FACTORS:   Currently Psychotic Previous Psychiatric Diagnoses and Treatments Medical Diagnoses and Treatments/Surgeries   Musculoskeletal: Strength & Muscle Tone: within normal limits Gait & Station: normal Patient leans: N/A  Psychiatric Specialty Exam: Physical Exam  Review of Systems  Psychiatric/Behavioral: Positive for depression. The patient is nervous/anxious.   All other systems reviewed and are negative.   Blood pressure 109/79, pulse (!) 106, temperature 97.8 F (36.6 C), temperature source Oral, resp. rate 16, height 5\' 8"  (1.727 m), weight 86.2 kg  (190 lb).Body mass index is 28.89 kg/m.                  Please see H&P.                                         COGNITIVE FEATURES THAT CONTRIBUTE TO RISK:  Closed-mindedness, Polarized thinking and Thought constriction (tunnel vision)    SUICIDE RISK:   Mild:  Suicidal ideation of limited frequency, intensity, duration, and specificity.  There are no identifiable plans, no associated intent, mild dysphoria and related symptoms, good self-control (both objective and subjective assessment), few other risk factors, and identifiable protective factors, including available and accessible social support.  PLAN OF CARE: Please see H&P.   I certify that inpatient services furnished can reasonably be expected to improve the patient's condition.   Shakea Isip, MD 09/29/2016, 1:36 PM

## 2016-09-29 NOTE — Progress Notes (Signed)
Patient ID: Joshua [name], male   DOB: [birth year], 61 y.o.   MRN: [id number]  Patient signed a 72 hour request for discharge at 1202 this afternoon.

## 2016-09-29 NOTE — H&P (Signed)
Psychiatric Admission Assessment Adult  Patient Identification: Joshua [name] MRN:  [id number] Date of Evaluation:  09/29/2016 Chief Complaint: Patient states " I am god.'  Principal Diagnosis: Schizoaffective disorder, bipolar type (HCC) Diagnosis:   Patient Active Problem List   Diagnosis Date Noted  . Hyponatremia [E87.1] 09/29/2016  . Hypokalemia [E87.6] 09/29/2016  . Schizoaffective disorder, bipolar type (HCC) [F25.0] 05/10/2016  . Cannabis use disorder, moderate, dependence (HCC) [F12.20] 05/10/2016  . Generalized anxiety disorder [F41.1] 05/04/2016   History of Present Illness: Joshua [name] is a 23 y old CM, who is married , lives with his wife in [place] , has a hx of bipolar disorder, presented very psychotic and manic.  Patient seen and chart reviewed.Discussed patient with treatment team. Pt presents as manic, delusional, states he is 'Joshua [name]". Pt reports he is a psychic and that he can read people. 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 He reports he is like a messenger . He went on to talk about irrational things like seeing a dog under near his window the other day who was here to protect him as well as talked at length about " [name]". Pt reports sleep issues , states he has not been able to sleep since the past several nights . He reports he stays up and "thinks" on nights that he cannot sleep. Pt also appeared to be very emotional and tearful during the assessment. Pt reports a hx of being very manic in the past when he has done some wild things. However recently his manic sx are more like him being delusional and not sleeping. Pt's thought process appeared to be tangential and disorganized. Pt seemed like an unreliable historian and hence contacted wife - Ms.[employer].  Per wife - Pt has a hx of bipolar sx, he has been in treatment since his early 20's . Pt recently had severe headaches and went to [facility] urgent care clinic. Pt was seen there and he received three to 4  Injections . Per her she does not remember what he got. However he became all wired up , hyperactive , started talking crazy and not sleeping after these injections. She thinks that has something to do with it. Per review of EHR - he received Decadron, imitrex as well as toradol on 09/19/16. Per wife pt has been following up with [facility] , had an appointment today , she cancelled it since he is at [facility]. Per her pt is currently on risperidone , depakote and trazodone. However he has not been taking them right , especially the depakote since he has been having some bruises on his skin which he attributes to the depakote .Recent admissions at [place] , [place], [facility] as well as was taken to [facility] recently. Denies past hx of suicide attempts.  Associated Signs/Symptoms: Depression Symptoms:  insomnia, psychomotor agitation, difficulty concentrating, (Hypo) Manic Symptoms:  Delusions, Grandiosity, Hallucinations, Impulsivity, Irritable Mood, Anxiety Symptoms:  denies Psychotic Symptoms:  Delusions, Paranoia, PTSD Symptoms: Negative Total Time spent with patient: 1 hour  Past Psychiatric History: Please see HP for details  Is the patient at risk to self? Yes.    Has the patient been a risk to self in the past 6 months? Yes.    Has the patient been a risk to self within the distant past? Yes.    Is the patient a risk to others? Yes.    Has the patient been a risk to others in the past 6 months? No.  Has the  patient been a risk to others within the distant past? No.   Prior Inpatient Therapy:   Prior Outpatient Therapy:    Alcohol Screening: 1. How often do you have a drink containing alcohol?: Monthly or less 2. How many drinks containing alcohol do you have on a typical day when you are drinking?: 1 or 2 3. How often do you have six or more drinks on one occasion?: Never Preliminary Score: 0 9. Have you or someone else been injured as a result of your drinking?: No 10. Has a relative or friend or  a doctor or another health worker been concerned about your drinking or suggested you cut down?: No Alcohol Use Disorder Identification Test Final Score (AUDIT): 1 Brief Intervention: AUDIT score less than 7 or less-screening does not suggest unhealthy drinking-brief intervention not indicated Substance Abuse History in the last 12 months:  Yes.  Cannabis Consequences of Substance Abuse: Medical Consequences:  current admission Previous Psychotropic Medications: Yes - depakote , gabapentin ( not effective), risperidone , thorazine , haldol, lamictal ( made worse)  Psychological Evaluations: Yes  Past Medical History:  Past Medical History:  Diagnosis Date  . Back pain   . Bipolar disorder ([place])   . Cannabis use disorder, moderate, dependence ([place])   . Chronic pain   . Generalized anxiety disorder   . Hypertension   . Schizoaffective disorder [facility])     Past Surgical History:  Procedure Laterality Date  . APPENDECTOMY     Family History:  Family History  Problem Relation Age of Onset  . Mental illness Neg Hx    Family Psychiatric  History: Denies  Tobacco Screening: Have you used any form of tobacco in the last 30 days? (Cigarettes, Smokeless Tobacco, Cigars, and/or Pipes): Yes Tobacco use, Select all that apply: 5 or more cigarettes per day Are you interested in Tobacco Cessation Medications?: Yes, will notify MD for an order Counseled patient on smoking cessation including recognizing danger situations, developing coping skills and basic information about quitting provided: Refused/Declined practical counseling Social History: Married , lives in [place] with wife and 36 y old son, is on SSD. History  Alcohol Use  . Yes    Comment: occ     History  Drug Use  . Types: Marijuana    Comment: daily    Additional Social History:      Pain Medications: See MAR Prescriptions: See MAR Over the Counter: See MAR History of alcohol / drug use?: Yes Longest period of sobriety  (when/how long): unknown Withdrawal Symptoms: Other (Comment) (Unknown) Name of Substance 1: Marijuana 1 - Age of First Use: 14 1 - Amount (size/oz): Unsure 1 - Frequency: unable to answer  1 - Duration: on and off for year 1 - Last Use / Amount: unknown                  Allergies:   Allergies  Allergen Reactions  . Imitrex [Sumatriptan] Other (See Comments)    mania   Lab Results:  Results for orders placed or performed during the hospital encounter of 09/28/16 (from the past 48 hour(s))  Rapid urine drug screen (hospital performed)     Status: Abnormal   Collection Time: 09/28/16 12:58 AM  Result Value Ref Range   Opiates NONE DETECTED NONE DETECTED   Cocaine NONE DETECTED NONE DETECTED   Benzodiazepines POSITIVE (A) NONE DETECTED   Amphetamines NONE DETECTED NONE DETECTED   Tetrahydrocannabinol POSITIVE (A) NONE DETECTED   Barbiturates NONE DETECTED  NONE DETECTED    Comment:        DRUG SCREEN FOR MEDICAL PURPOSES ONLY.  IF CONFIRMATION IS NEEDED FOR ANY PURPOSE, NOTIFY LAB WITHIN 5 DAYS.        LOWEST DETECTABLE LIMITS FOR URINE DRUG SCREEN Drug Class       Cutoff (ng/mL) Amphetamine      1000 Barbiturate      200 Benzodiazepine   200 Tricyclics       300 Opiates          300 Cocaine          300 THC              50   Comprehensive metabolic panel     Status: Abnormal   Collection Time: 09/28/16  1:00 AM  Result Value Ref Range   Sodium 132 (L) 135 - 145 mmol/L   Potassium 3.3 (L) 3.5 - 5.1 mmol/L   Chloride 103 101 - 111 mmol/L   CO2 17 (L) 22 - 32 mmol/L   Glucose, Bld 92 65 - 99 mg/dL   BUN 20 6 - 20 mg/dL   Creatinine, Ser 8.50 (H) 0.61 - 1.24 mg/dL   Calcium 9.2 8.9 - 20.7 mg/dL   Total Protein 6.7 6.5 - 8.1 g/dL   Albumin 4.0 3.5 - 5.0 g/dL   AST 64 (H) 15 - 41 U/L   ALT 29 17 - 63 U/L   Alkaline Phosphatase 61 38 - 126 U/L   Total Bilirubin 1.0 0.3 - 1.2 mg/dL   GFR calc non Af Amer 49 (L) >60 mL/min   GFR calc Af Amer 57 (L) >60 mL/min     Comment: (NOTE) The eGFR has been calculated using the CKD EPI equation. This calculation has not been validated in all clinical situations. eGFR's persistently <60 mL/min signify possible Chronic Kidney Disease.    Anion gap 12 5 - 15  Ethanol     Status: None   Collection Time: 09/28/16  1:00 AM  Result Value Ref Range   Alcohol, Ethyl (B) <5 <5 mg/dL    Comment:        LOWEST DETECTABLE LIMIT FOR SERUM ALCOHOL IS 5 mg/dL FOR MEDICAL PURPOSES ONLY   Salicylate level     Status: None   Collection Time: 09/28/16  1:00 AM  Result Value Ref Range   Salicylate Lvl <7.0 2.8 - 30.0 mg/dL  Acetaminophen level     Status: Abnormal   Collection Time: 09/28/16  1:00 AM  Result Value Ref Range   Acetaminophen (Tylenol), Serum <10 (L) 10 - 30 ug/mL    Comment:        THERAPEUTIC CONCENTRATIONS VARY SIGNIFICANTLY. A RANGE OF 10-30 ug/mL MAY BE AN EFFECTIVE CONCENTRATION FOR MANY PATIENTS. HOWEVER, SOME ARE BEST TREATED AT CONCENTRATIONS OUTSIDE THIS RANGE. ACETAMINOPHEN CONCENTRATIONS >150 ug/mL AT 4 HOURS AFTER INGESTION AND >50 ug/mL AT 12 HOURS AFTER INGESTION ARE OFTEN ASSOCIATED WITH TOXIC REACTIONS.   cbc     Status: Abnormal   Collection Time: 09/28/16  1:00 AM  Result Value Ref Range   WBC 8.6 4.0 - 10.5 K/uL   RBC 4.20 (L) 4.22 - 5.81 MIL/uL   Hemoglobin 13.4 13.0 - 17.0 g/dL   HCT 98.1 (L) 74.7 - 86.8 %   MCV 91.2 78.0 - 100.0 fL   MCH 31.9 26.0 - 34.0 pg   MCHC 35.0 30.0 - 36.0 g/dL   RDW 97.6 93.0 - 08.8 %   Platelets  205 150 - 400 K/uL    Blood Alcohol level:  Lab Results  Component Value Date   ETH <5 09/28/2016   ETH <5 81/27/5170    Metabolic Disorder Labs:  No results found for: HGBA1C, MPG No results found for: PROLACTIN No results found for: CHOL, TRIG, HDL, CHOLHDL, VLDL, LDLCALC  Current Medications: Current Facility-Administered Medications  Medication Dose Route Frequency Provider Last Rate Last Dose  . acetaminophen (TYLENOL) tablet  650 mg  650 mg Oral Q6H PRN Okonkwo, Justina A, NP      . alum & mag hydroxide-simeth (MAALOX/MYLANTA) 200-200-20 MG/5ML suspension 30 mL  30 mL Oral Q4H PRN Okonkwo, Justina A, NP      . glipiZIDE (GLUCOTROL) tablet 10 mg  10 mg Oral BID WC Okonkwo, Justina A, NP   10 mg at 09/29/16 0829  . hydrochlorothiazide (HYDRODIURIL) tablet 25 mg  25 mg Oral Ashley Akin, Justina A, NP   25 mg at 09/29/16 0546  . hydrOXYzine (ATARAX/VISTARIL) tablet 50 mg  50 mg Oral TID PRN Hughie Closs A, NP   50 mg at 09/28/16 2121  . lisinopril (PRINIVIL,ZESTRIL) tablet 40 mg  40 mg Oral Ashley Akin, Justina A, NP   40 mg at 09/29/16 0546  . magnesium hydroxide (MILK OF MAGNESIA) suspension 30 mL  30 mL Oral Daily PRN Okonkwo, Justina A, NP      . mometasone-formoterol (DULERA) 200-5 MCG/ACT inhaler 2 puff  2 puff Inhalation BID Okonkwo, Justina A, NP   2 puff at 09/29/16 0829  . nicotine polacrilex (NICORETTE) gum 2 mg  2 mg Oral PRN Dyson Sevey, Ria Clock, MD      . traZODone (DESYREL) tablet 50 mg  50 mg Oral QHS PRN,MR X 1 Okonkwo, Justina A, NP   50 mg at 09/28/16 2121   PTA Medications: Prescriptions Prior to Admission  Medication Sig Dispense Refill Last Dose  . ADVAIR DISKUS 250-50 MCG/DOSE AEPB Inhale 1 puff into the lungs 2 (two) times daily.  0 09/27/2016 at Unknown time  . divalproex (DEPAKOTE ER) 500 MG 24 hr tablet Take 3 tablets (1,500 mg total) by mouth at bedtime. 90 tablet 0 09/27/2016 at Unknown time  . ferrous sulfate 325 (65 FE) MG tablet Take 325 mg by mouth daily with breakfast.   09/27/2016 at Unknown time  . glipiZIDE (GLUCOTROL) 10 MG tablet Take 1 tablet (10 mg total) by mouth 2 (two) times daily. 60 tablet 0 09/27/2016 at Unknown time  . hydrochlorothiazide (HYDRODIURIL) 25 MG tablet Take 1 tablet (25 mg total) by mouth every morning. 30 tablet 0 09/27/2016 at Unknown time  . lisinopril (PRINIVIL,ZESTRIL) 40 MG tablet Take 1 tablet (40 mg total) by mouth every morning. 30 tablet 0 09/27/2016 at  Unknown time  . risperiDONE (RISPERDAL) 1 MG tablet Take 1 mg by mouth at bedtime.   09/27/2016 at Unknown time  . traMADol (ULTRAM) 50 MG tablet Take 50-100 mg by mouth every 6 (six) hours as needed for moderate pain or severe pain.   09/27/2016 at Unknown time  . VENTOLIN HFA 108 (90 Base) MCG/ACT inhaler Inhale 1-2 puffs into the lungs every 6 (six) hours as needed for shortness of breath.   0 09/27/2016 at Unknown time    Musculoskeletal: Strength & Muscle Tone: within normal limits Gait & Station: normal Patient leans: N/A  Psychiatric Specialty Exam: Physical Exam  Review of Systems  Psychiatric/Behavioral: Positive for hallucinations. The patient is nervous/anxious and has insomnia.   All other systems reviewed  and are negative.   Blood pressure 109/79, pulse (!) 106, temperature 97.8 F (36.6 C), temperature source Oral, resp. rate 16, height '5\' 8"'$  (1.727 m), weight 86.2 kg (190 lb).Body mass index is 28.89 kg/m.  General Appearance: Disheveled  Eye Contact:  Fair  Speech:  Pressured  Volume:  Normal  Mood:  Anxious, Depressed and Dysphoric  Affect:  Labile  Thought Process:  Disorganized, Irrelevant and Descriptions of Associations: Circumstantial  Orientation:  Full (Time, Place, and Person)  Thought Content:  Delusions, Ideas of Reference:   Paranoia Delusions, Rumination and Tangential  Suicidal Thoughts:  No  Homicidal Thoughts:  No  Memory:  Immediate;   Fair Recent;   Poor Remote;   Poor  Judgement:  Impaired  Insight:  Shallow  Psychomotor Activity:  Increased  Concentration:  Concentration: Fair and Attention Span: Fair  Recall:  [employer] of Knowledge:  Fair  Language:  Fair  Akathisia:  No  Handed:  Right  AIMS (if indicated):     Assets:  Armed forces logistics/support/administrative officer Social Support  ADL's:  Impaired  Cognition:  WNL  Sleep:  Number of Hours: 6.5    Treatment Plan Summary:Patient with past hx of bipolar versus schizoaffective do , presented with psychosis,  delusions, grandiose ideations , noncompliance with medications, recent imitrex as well as steroid injections , likely precipitated this current episode , will start treatment and observe on the unit.  Daily contact with patient to assess and evaluate symptoms and progress in treatment, Medication management and Plan see below  Patient will benefit from inpatient treatment and stabilization.  Estimated length of stay is 5-7 days.  Reviewed past medical records,treatment plan.  Will start Zyprexa zydis 5 mg po qhs and 5 mg po qnoon for mood sx/psychosis. Pt has had side effects or failed several mood stabilizers in the past. Currently on depakote , however has hyponatremia as well as some bruising , past hx of thrombocytopenia , hence will discontinue depakote . Will repeat CBC, CMP . Will continue to monitor vitals ,medication compliance and treatment side effects while patient is here.  Will monitor for medical issues as well as call consult as needed.  Reviewed labs NA+ - 132 , K - 3.3 , UDS - positive for BZD, THC . Will get EKG for qtc monitoring. Collateral information obtained from wife - see above. CSW will start working on disposition. Pt signed 72 hr discharge application on 04/28/7515. Patient to participate in therapeutic milieu .       Observation Level/Precautions:  15 minute checks    Psychotherapy:  Individual and group therapy     Consultations:  CSW  Discharge Concerns:  Stability and safety       Physician Treatment Plan for Primary Diagnosis: Schizoaffective disorder, bipolar type ([place]) Long Term Goal(s): Improvement in symptoms so as ready for discharge  Short Term Goals: Ability to verbalize feelings will improve and Compliance with prescribed medications will improve  Physician Treatment Plan for Secondary Diagnosis: Principal Problem:   Schizoaffective disorder, bipolar type ([place]) Active Problems:   Generalized anxiety disorder   Cannabis use disorder,  moderate, dependence ([place])   Hyponatremia   Hypokalemia  Long Term Goal(s): Improvement in symptoms so as ready for discharge  Short Term Goals: Ability to verbalize feelings will improve and Compliance with prescribed medications will improve  I certify that inpatient services furnished can reasonably be expected to improve the patient's condition.    Lequan Dobratz, MD 6/6/20182:00 PM

## 2016-09-30 DIAGNOSIS — I10 Essential (primary) hypertension: Secondary | ICD-10-CM

## 2016-09-30 DIAGNOSIS — F17210 Nicotine dependence, cigarettes, uncomplicated: Secondary | ICD-10-CM

## 2016-09-30 DIAGNOSIS — F1290 Cannabis use, unspecified, uncomplicated: Secondary | ICD-10-CM

## 2016-09-30 LAB — CBC WITH DIFFERENTIAL/PLATELET
Basophils Absolute: 0 10*3/uL (ref 0.0–0.1)
Basophils Relative: 0 %
EOS PCT: 1 %
Eosinophils Absolute: 0.1 10*3/uL (ref 0.0–0.7)
HEMATOCRIT: 39.7 % (ref 39.0–52.0)
HEMOGLOBIN: 13.9 g/dL (ref 13.0–17.0)
LYMPHS ABS: 2.8 10*3/uL (ref 0.7–4.0)
LYMPHS PCT: 28 %
MCH: 32 pg (ref 26.0–34.0)
MCHC: 35 g/dL (ref 30.0–36.0)
MCV: 91.5 fL (ref 78.0–100.0)
Monocytes Absolute: 1.1 10*3/uL — ABNORMAL HIGH (ref 0.1–1.0)
Monocytes Relative: 10 %
NEUTROS ABS: 6.1 10*3/uL (ref 1.7–7.7)
Neutrophils Relative %: 61 %
Platelets: 231 10*3/uL (ref 150–400)
RBC: 4.34 MIL/uL (ref 4.22–5.81)
RDW: 14.2 % (ref 11.5–15.5)
WBC: 10.1 10*3/uL (ref 4.0–10.5)

## 2016-09-30 LAB — COMPREHENSIVE METABOLIC PANEL
ALK PHOS: 60 U/L (ref 38–126)
ALT: 29 U/L (ref 17–63)
AST: 43 U/L — ABNORMAL HIGH (ref 15–41)
Albumin: 3.7 g/dL (ref 3.5–5.0)
Anion gap: 9 (ref 5–15)
BILIRUBIN TOTAL: 0.5 mg/dL (ref 0.3–1.2)
BUN: 15 mg/dL (ref 6–20)
CALCIUM: 9.2 mg/dL (ref 8.9–10.3)
CO2: 23 mmol/L (ref 22–32)
CREATININE: 1.04 mg/dL (ref 0.61–1.24)
Chloride: 98 mmol/L — ABNORMAL LOW (ref 101–111)
GFR calc Af Amer: >60 mL/min (ref >60)
Glucose, Bld: 85 mg/dL (ref 65–99)
Potassium: 4 mmol/L (ref 3.5–5.1)
Sodium: 130 mmol/L — ABNORMAL LOW (ref 135–145)
Total Protein: 6.2 g/dL — ABNORMAL LOW (ref 6.5–8.1)

## 2016-09-30 LAB — LIPID PANEL
CHOL/HDL RATIO: 3.3 ratio
CHOLESTEROL: 111 mg/dL (ref 0–200)
HDL: 34 mg/dL — ABNORMAL LOW (ref >40)
LDL Cholesterol: 42 mg/dL (ref 0–99)
Triglycerides: 176 mg/dL — ABNORMAL HIGH (ref <150)
VLDL: 35 mg/dL (ref 0–40)

## 2016-09-30 LAB — TSH: TSH: 0.639 u[IU]/mL (ref 0.350–4.500)

## 2016-09-30 MED ORDER — OLANZAPINE 5 MG PO TBDP
15.0000 mg | ORAL_TABLET | Freq: Every day | ORAL | Status: DC
  Administered 2016-09-30 – 2016-10-01 (×2): 15 mg via ORAL
  Filled 2016-09-30 (×4): qty 3

## 2016-09-30 NOTE — Progress Notes (Signed)
DAR NOTE: Patient presents with anxious affect and mood.  Patient observed pacing the hallway and dayroom with bizarre hand gestures and movement.  Denies auditory and visual hallucinations.  Reports withdrawal symptoms of diarrhea, runny nose and irritability on self inventory form.  Described energy level as normal and concentration as good.  Rates depression at 0, hopelessness at 0, and anxiety at 0.  Maintained on routine safety checks.  Medications given as prescribed.  Support and encouragement offered as needed.  Attended group and participated.  States goal for today is "to get out of here."  Patient is visible in milieu for therapy and activities.  Tylenol 650 mg for complain of right hip pain with fair effect.  Patient continues to reports lot of somatic symptoms.

## 2016-09-30 NOTE — BHH Group Notes (Signed)
[facility] Mental Health Association Group Therapy  09/30/2016 , 1:29 PM    Type of Therapy:  Mental Health Association Presentation  Participation Level:  Active  Participation Quality:  Attentive  Affect:  Blunted  Cognitive:  Oriented  Insight:  Limited  Engagement in Therapy:  Engaged  Modes of Intervention:  Discussion, Education and Socialization  Summary of Progress/Problems:  [name] from [facility] came to present his recovery story and play the guitar.  In and out a couple of times.  Difficult time sitting still and listening.  Moved from chair to chair.  Writing notes to a distraught, symptomatic patient.  Sat at window crunching ice and looking out the window.  Left,. came back, left again  [place] B 09/30/2016 , 1:29 PM

## 2016-09-30 NOTE — Progress Notes (Signed)
Nursing Progress Note [id number]  D) Patient presents anxious but is calm and cooperative on the unit. Patient did not attend karaoke this evening but was seen up in the milieu. Patient complained of chronic lower back/"butt" pain. Patient requested medication for sleep and anxiety. Patient denies SI/HI/AVH. Patient contracts for safety on the unit. Patient reports sleeping well with current regimen.  A)  Emotional support given. 1:1 interaction and active listening provided. Patient medicated as prescribed. Medications and plan of care reviewed with patient. Patient verbalized understanding without further questions.  Snacks and fluids provided. Opportunities for questions or concerns presented to patient. Patient encouraged to continue to work on treatment goals. Labs, vital signs and patient behavior monitored throughout shift. Patient safety maintained with q15 min safety checks. Low fall risk precautions in place and reviewed with patient; patient verbalized understanding.  R) Patient receptive to interaction with nurse. Patient remains safe on the unit at this time. Patient denies any adverse medication reactions at this time. Patient is resting in bed without complaints. Will continue to monitor.

## 2016-09-30 NOTE — Progress Notes (Signed)
Recreation Therapy Notes  Date: 09/30/16 Time: 1000 Location: 500 Hall Dayroom  Group Topic: Wellness  Goal Area(s) Addresses:  Patient will define components of whole wellness. Patient will verbalize benefit of whole wellness.  Behavioral Response: Engaged  Intervention:  10 plastic cups, 2 soft foam balls   Activity: Bowling.  Patients were divided into two teams.  Each cup the player knocked down counted as one point.  Each player was given two chances to knock down the cups.  If a player got a strike on their first try, then it would be the next person's turn.  The team with the highest score wins the game.     Education: Wellness, Dentist.   Education Outcome: Acknowledges education/In group clarification offered/Needs additional education.   Clinical Observations/Feedback: Pt started out being able to concentrate on the activity.  Pt was active when it came to him taking his turn.  Pt had to be redirected from walking in front players before they took their turns.  Pt also stood up in a chair and had to be redirected to get down.        [name], LRT/CTRS       Ria Comment, Shatora Weatherbee A 09/30/2016 11:51 AM
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
## 2016-09-30 NOTE — Plan of Care (Signed)
Problem: Activity: Goal: Sleeping patterns will improve Outcome: Progressing Patient reports sleeping well with current regimen; patient currently resting in bed with eyes closed. Respirations even and unlabored.

## 2016-09-30 NOTE — Plan of Care (Signed)
Problem: Health Behavior/Discharge Planning: Goal: Compliance with prescribed medication regimen will improve Outcome: Progressing Patient is compliant with medication regimen.

## 2016-09-30 NOTE — Progress Notes (Signed)
[facility] MD Progress Note  09/30/2016 11:46 AM Joshua [name]  MRN:  [id number] Subjective:   61 yo Caucasian male, unemployed,  married, lives with his family. History of Bipolar Disorder since hs early twenties. Presented to the ER initially complaining of headaches. Later expressed grandiose delusions. He believes that he is [employer]. He believes he has magical powers as he could read peoples mind and their future. He exhibited bizarre mannerisms. He was noted to be responding to internal stimuli at the ER. Collateral from his family indicates that had been off medications. He has not been sleeping well at night. UDS was positive for benzodiazepines and THC. Presented with electrolyte derangement which was addressed at the ER.   Chart reviewed today. Patient discussed at team  Staff reports that he is hyperactive and pressured and over-taklative. . Religiously and somatically focused. Attends groups. Has been taking medications a prescribed. Very poor sleep last night.   Seen today. Says he feels better on his medications. Headache has completely resolved. Says the medications has also helped him straighten his mind. Describes self as being very shy before. Says he is now able to interact with people. No longer able to read peoples mind. Has insight into how he felt he was [name] at presentation. Tells me that he knows he is not [name] as his name is Joshua [name]. Does not feel as if he has any special powers. Culture appropriate belief that everyone is special in their own way. Does not see self as supernatural. No hallucination in any modality. No thoughts of violence. Tolerates his medication well. Says he hopes to attend his grandsons graduation today.  Principal Problem: Schizoaffective disorder, bipolar type ([place]) Diagnosis:   Patient Active Problem List   Diagnosis Date Noted  . Hyponatremia [E87.1] 09/29/2016  . Hypokalemia [E87.6] 09/29/2016  . Schizoaffective disorder, bipolar type ([place])  [F25.0] 05/10/2016  . Cannabis use disorder, moderate, dependence ([place]) [F12.20] 05/10/2016  . Generalized anxiety disorder [F41.1] 05/04/2016   Total Time spent with patient: 20 minutes  Past Psychiatric History: As in H&P  Past Medical History:  Past Medical History:  Diagnosis Date  . Back pain   . Bipolar disorder ([place])   . Cannabis use disorder, moderate, dependence ([place])   . Chronic pain   . Generalized anxiety disorder   . Hypertension   . Schizoaffective disorder [facility])     Past Surgical History:  Procedure Laterality Date  . APPENDECTOMY     Family History:  Family History  Problem Relation Age of Onset  . Mental illness Neg Hx    Family Psychiatric  History: As in H&P Social History:  History  Alcohol Use  . Yes    Comment: occ     History  Drug Use  . Types: Marijuana    Comment: daily    Social History   Social History  . Marital status: Single    Spouse name: N/A  . Number of children: N/A  . Years of education: N/A   Social History Main Topics  . Smoking status: Current Every Day Smoker    Packs/day: 2.00    Types: Cigarettes  . Smokeless tobacco: Never Used  . Alcohol use Yes     Comment: occ  . Drug use: Yes    Types: Marijuana     Comment: daily  . Sexual activity: Not Asked   Other Topics Concern  . None   Social History Narrative  . None   Additional Social History:  Pain Medications: See MAR Prescriptions: See MAR Over the Counter: See MAR History of alcohol / drug use?: Yes Longest period of sobriety (when/how long): unknown Withdrawal Symptoms: Other (Comment) (Unknown) Name of Substance 1: Marijuana 1 - Age of First Use: 14 1 - Amount (size/oz): Unsure 1 - Frequency: unable to answer  1 - Duration: on and off for year 1 - Last Use / Amount: unknown         Sleep: Fair  Appetite:  Fair  Current Medications: Current Facility-Administered Medications  Medication Dose Route Frequency Provider Last Rate Last  Dose  . acetaminophen (TYLENOL) tablet 650 mg  650 mg Oral Q6H PRN Okonkwo, Justina A, NP   650 mg at 09/30/16 0834  . alum & mag hydroxide-simeth (MAALOX/MYLANTA) 200-200-20 MG/5ML suspension 30 mL  30 mL Oral Q4H PRN Okonkwo, Justina A, NP      . glipiZIDE (GLUCOTROL) tablet 10 mg  10 mg Oral BID WC Okonkwo, Justina A, NP   10 mg at 09/30/16 0832  . hydrochlorothiazide (HYDRODIURIL) tablet 25 mg  25 mg Oral Ashley Akin, Justina A, NP   25 mg at 09/30/16 8099  . hydrOXYzine (ATARAX/VISTARIL) tablet 50 mg  50 mg Oral TID PRN Hughie Closs A, NP   50 mg at 09/29/16 2115  . lisinopril (PRINIVIL,ZESTRIL) tablet 40 mg  40 mg Oral Ashley Akin, Justina A, NP   40 mg at 09/30/16 0627  . magnesium hydroxide (MILK OF MAGNESIA) suspension 30 mL  30 mL Oral Daily PRN Okonkwo, Justina A, NP      . mometasone-formoterol (DULERA) 200-5 MCG/ACT inhaler 2 puff  2 puff Inhalation BID Okonkwo, Justina A, NP   2 puff at 09/30/16 0832  . nicotine polacrilex (NICORETTE) gum 2 mg  2 mg Oral PRN Ursula Alert, MD   2 mg at 09/29/16 2115  . OLANZapine zydis (ZYPREXA) disintegrating tablet 5 mg  5 mg Oral QPC lunch Eappen, Saramma, MD   5 mg at 09/29/16 1501  . OLANZapine zydis (ZYPREXA) disintegrating tablet 5 mg  5 mg Oral QHS Eappen, Ria Clock, MD   5 mg at 09/29/16 2115  . traZODone (DESYREL) tablet 50 mg  50 mg Oral QHS PRN,MR X 1 Okonkwo, Justina A, NP   50 mg at 09/29/16 2324    Lab Results:  Results for orders placed or performed during the hospital encounter of 09/28/16 (from the past 48 hour(s))  CBC with Differential/Platelet     Status: Abnormal   Collection Time: 09/30/16  6:51 AM  Result Value Ref Range   WBC 10.1 4.0 - 10.5 K/uL   RBC 4.34 4.22 - 5.81 MIL/uL   Hemoglobin 13.9 13.0 - 17.0 g/dL   HCT 39.7 39.0 - 52.0 %   MCV 91.5 78.0 - 100.0 fL   MCH 32.0 26.0 - 34.0 pg   MCHC 35.0 30.0 - 36.0 g/dL   RDW 14.2 11.5 - 15.5 %   Platelets 231 150 - 400 K/uL   Neutrophils Relative % 61 %    Neutro Abs 6.1 1.7 - 7.7 K/uL   Lymphocytes Relative 28 %   Lymphs Abs 2.8 0.7 - 4.0 K/uL   Monocytes Relative 10 %   Monocytes Absolute 1.1 (H) 0.1 - 1.0 K/uL   Eosinophils Relative 1 %   Eosinophils Absolute 0.1 0.0 - 0.7 K/uL   Basophils Relative 0 %   Basophils Absolute 0.0 0.0 - 0.1 K/uL    Comment: Performed at [facility], [place]  [address] [place], [place] [address]  Comprehensive metabolic panel     Status: Abnormal   Collection Time: 09/30/16  6:51 AM  Result Value Ref Range   Sodium 130 (L) 135 - 145 mmol/L   Potassium 4.0 3.5 - 5.1 mmol/L   Chloride 98 (L) 101 - 111 mmol/L   CO2 23 22 - 32 mmol/L   Glucose, Bld 85 65 - 99 mg/dL   BUN 15 6 - 20 mg/dL   Creatinine, Ser 1.04 0.61 - 1.24 mg/dL   Calcium 9.2 8.9 - 10.3 mg/dL   Total Protein 6.2 (L) 6.5 - 8.1 g/dL   Albumin 3.7 3.5 - 5.0 g/dL   AST 43 (H) 15 - 41 U/L   ALT 29 17 - 63 U/L   Alkaline Phosphatase 60 38 - 126 U/L   Total Bilirubin 0.5 0.3 - 1.2 mg/dL   GFR calc non Af Amer >60 >60 mL/min   GFR calc Af Amer >60 >60 mL/min    Comment: (NOTE) The eGFR has been calculated using the CKD EPI equation. This calculation has not been validated in all clinical situations. eGFR's persistently <60 mL/min signify possible Chronic Kidney Disease.    Anion gap 9 5 - 15    Comment: Performed at [facility], [place] [address], [place], [place] [address]  TSH     Status: None   Collection Time: 09/30/16  6:51 AM  Result Value Ref Range   TSH 0.639 0.350 - 4.500 uIU/mL    Comment: Performed by a 3rd Generation assay with a functional sensitivity of <=0.01 uIU/mL. Performed at [facility], [place] [address], [place], [place] [address]   Lipid panel     Status: Abnormal   Collection Time: 09/30/16  6:51 AM  Result Value Ref Range   Cholesterol 111 0 - 200 mg/dL   Triglycerides 176 (H) <150 mg/dL   HDL 34 (L) >40 mg/dL   Total CHOL/HDL Ratio 3.3 RATIO   VLDL 35 0 - 40  mg/dL   LDL Cholesterol 42 0 - 99 mg/dL    Comment:        Total Cholesterol/HDL:CHD Risk Coronary Heart Disease Risk Table                     Men   Women  1/2 Average Risk   3.4   3.3  Average Risk       5.0   4.4  2 X Average Risk   9.6   7.1  3 X Average Risk  23.4   11.0        Use the calculated Patient Ratio above and the CHD Risk Table to determine the patient's CHD Risk.        ATP III CLASSIFICATION (LDL):  <100     mg/dL   Optimal  100-129  mg/dL   Near or Above                    Optimal  130-159  mg/dL   Borderline  160-189  mg/dL   High  >190     mg/dL   Very High Performed at [facility] [address], [place], [place] [address]     Blood Alcohol level:  Lab Results  Component Value Date   [facility] <5 09/28/2016   ETH <5 29/56/2130    Metabolic Disorder Labs: No results found for: HGBA1C, MPG No results found for: PROLACTIN Lab Results  Component Value Date   CHOL 111 09/30/2016   TRIG 176 (H) 09/30/2016   HDL 34 (L) 09/30/2016   CHOLHDL 3.3 09/30/2016   VLDL 35 09/30/2016   LDLCALC 42 09/30/2016    Physical Findings: AIMS: Facial and Oral Movements Muscles of Facial Expression: None, normal Lips and Perioral Area: None, normal Jaw: None, normal Tongue: None, normal,Extremity Movements Upper (arms, wrists, hands, fingers): None, normal Lower (legs, knees, ankles, toes): None, normal, Trunk Movements Neck, shoulders, hips: None, normal, Overall Severity Severity of abnormal movements (highest score from questions above): None, normal Incapacitation due to abnormal movements: None, normal Patient's awareness of abnormal movements (rate only patient's report): No Awareness, Dental Status Current problems with teeth and/or dentures?: No Does patient usually wear dentures?: No  CIWA:    COWS:     Musculoskeletal: Strength & Muscle Tone: within normal limits Gait & Station: normal Patient leans: N/A  Psychiatric Specialty  Exam: Physical Exam  Constitutional: He is oriented to person, place, and time. No distress.  HENT:  Head: Normocephalic and atraumatic.  Eyes: Conjunctivae are normal. Pupils are equal, round, and reactive to light.  Neck: Normal range of motion. Neck supple.  Cardiovascular: Normal rate.   Respiratory: Effort normal.  Musculoskeletal: Normal range of motion.  Neurological: He is alert and oriented to person, place, and time.  Skin: Skin is warm.  Psychiatric:  As above    ROS  Blood pressure 109/77, pulse (!) 106, temperature 98.6 F (37 C), temperature source Oral, resp. rate 16, height _0  (1.727 m), weight 86.2 kg (190 lb).Body mass index is 28.89 kg/m.  General Appearance: Disheveled, chronic skin changes, had the air in his room very hot. Good rapport. Pleasant and polite. Not internally distracted.   Eye Contact:  Good  Speech:  Clear and Coherent and Normal Rate  Volume:  Normal  Mood:  Euthymic  Affect:  Appropriate and Restricted  Thought Process:  Linear  Orientation:  Full (Time, Place, and Person)  Thought Content:  Grandiose delusions are beginning to melt. hypereligiousity  No violent thoughts.   Suicidal Thoughts:  No  Homicidal Thoughts:  No  Memory:  Immediate;   Good Recent;   Fair Remote;   Good  Judgement:  Fair  Insight:  Partial   Psychomotor Activity:  Normal  Concentration:  Concentration: Fair and Attention Span: Fair  Recall:  Good  Fund of Knowledge:  Good  Language:  Good  Akathisia:  Negative  Handed:    AIMS (if indicated):     Assets:  Agricultural consultant Housing Intimacy Social Support  ADL's:  Impaired  Cognition:  WNL  Sleep:  Number of Hours: 4.75     Treatment Plan Summary: Patient is responding to treatment physically and mentally. He is tolerating his current regimen well. No dangerousness. Need further inpatient stabilization.   Psychiatric: Bipolar I Disorder current episode is  mania SUD ([facility])  Medical: HTN Hyponatremia ,,, corrected.  Hypokalemia ,,, resolving  Psychosocial:   PLAN: 1. Make Olanzapine 15 mg HS only 2. Continue to monitor mood, behavior and interaction with peers 3. Serial BMP 4. Hopeful discharge in a couple of days.    [place], MD 09/30/2016, 11:46 AM

## 2016-09-30 NOTE — Tx Team (Signed)
Interdisciplinary Treatment and Diagnostic Plan Update  09/30/2016 Time of Session: 10:11 AM  Joshua [name] MRN: [id number]  Principal Diagnosis: Schizoaffective disorder, bipolar type (HCC)  Secondary Diagnoses: Principal Problem:   Schizoaffective disorder, bipolar type (HCC) Active Problems:   Generalized anxiety disorder   Cannabis use disorder, moderate, dependence (HCC)   Hyponatremia   Hypokalemia   Current Medications:  Current Facility-Administered Medications  Medication Dose Route Frequency Provider Last Rate Last Dose  . acetaminophen (TYLENOL) tablet 650 mg  650 mg Oral Q6H PRN Okonkwo, Justina A, NP   650 mg at 09/30/16 0834  . alum & mag hydroxide-simeth (MAALOX/MYLANTA) 200-200-20 MG/5ML suspension 30 mL  30 mL Oral Q4H PRN Okonkwo, Justina A, NP      . glipiZIDE (GLUCOTROL) tablet 10 mg  10 mg Oral BID WC Okonkwo, Justina A, NP   10 mg at 09/30/16 0832  . hydrochlorothiazide (HYDRODIURIL) tablet 25 mg  25 mg Oral BH-q7a Okonkwo, Justina A, NP   25 mg at 09/30/16 0627  . hydrOXYzine (ATARAX/VISTARIL) tablet 50 mg  50 mg Oral TID PRN Okonkwo, Justina A, NP   50 mg at 09/29/16 2115  . lisinopril (PRINIVIL,ZESTRIL) tablet 40 mg  40 mg Oral BH-q7a Okonkwo, Justina A, NP   40 mg at 09/30/16 0627  . magnesium hydroxide (MILK OF MAGNESIA) suspension 30 mL  30 mL Oral Daily PRN Okonkwo, Justina A, NP      . mometasone-formoterol (DULERA) 200-5 MCG/ACT inhaler 2 puff  2 puff Inhalation BID Okonkwo, Justina A, NP   2 puff at 09/30/16 0832  . nicotine polacrilex (NICORETTE) gum 2 mg  2 mg Oral PRN Eappen, Saramma, MD   2 mg at 09/29/16 2115  . OLANZapine zydis (ZYPREXA) disintegrating tablet 5 mg  5 mg Oral QPC lunch Eappen, Saramma, MD   5 mg at 09/29/16 1501  . OLANZapine zydis (ZYPREXA) disintegrating tablet 5 mg  5 mg Oral QHS Eappen, Saramma, MD   5 mg at 09/29/16 2115  . traZODone (DESYREL) tablet 50 mg  50 mg Oral QHS PRN,MR X 1 Okonkwo, Justina A, NP   50 mg at 09/29/16  2324    PTA Medications: Prescriptions Prior to Admission  Medication Sig Dispense Refill Last Dose  . ADVAIR DISKUS 250-50 MCG/DOSE AEPB Inhale 1 puff into the lungs 2 (two) times daily.  0 09/27/2016 at Unknown time  . divalproex (DEPAKOTE ER) 500 MG 24 hr tablet Take 3 tablets (1,500 mg total) by mouth at bedtime. 90 tablet 0 09/27/2016 at Unknown time  . ferrous sulfate 325 (65 FE) MG tablet Take 325 mg by mouth daily with breakfast.   09/27/2016 at Unknown time  . glipiZIDE (GLUCOTROL) 10 MG tablet Take 1 tablet (10 mg total) by mouth 2 (two) times daily. 60 tablet 0 09/27/2016 at Unknown time  . hydrochlorothiazide (HYDRODIURIL) 25 MG tablet Take 1 tablet (25 mg total) by mouth every morning. 30 tablet 0 09/27/2016 at Unknown time  . lisinopril (PRINIVIL,ZESTRIL) 40 MG tablet Take 1 tablet (40 mg total) by mouth every morning. 30 tablet 0 09/27/2016 at Unknown time  . risperiDONE (RISPERDAL) 1 MG tablet Take 1 mg by mouth at bedtime.   09/27/2016 at Unknown time  . traMADol (ULTRAM) 50 MG tablet Take 50-100 mg by mouth every 6 (six) hours as needed for moderate pain or severe pain.   09/27/2016 at Unknown time  . VENTOLIN HFA 108 (90 Base) MCG/ACT inhaler Inhale 1-2 puffs into the lungs every 6 (  six) hours as needed for shortness of breath.   0 09/27/2016 at Unknown time    Treatment Modalities: Medication Management, Group therapy, Case management,  1 to 1 session with clinician, Psychoeducation, Recreational therapy.   Physician Treatment Plan for Primary Diagnosis: Schizoaffective disorder, bipolar type (HCC) Long Term Goal(s): Improvement in symptoms so as ready for discharge  Short Term Goals: Ability to verbalize feelings will improve   Medication Management: Evaluate patient's response, side effects, and tolerance of medication regimen.  Therapeutic Interventions: 1 to 1 sessions, Unit Group sessions and Medication administration.  Evaluation of Outcomes: Adequate for Discharge  Physician  Treatment Plan for Secondary Diagnosis: Principal Problem:   Schizoaffective disorder, bipolar type (HCC) Active Problems:   Generalized anxiety disorder   Cannabis use disorder, moderate, dependence (HCC)   Hyponatremia   Hypokalemia   Long Term Goal(s): Improvement in symptoms so as ready for discharge  Short Term Goals: Compliance with prescribed medications will improve  Medication Management: Evaluate patient's response, side effects, and tolerance of medication regimen.  Therapeutic Interventions: 1 to 1 sessions, Unit Group sessions and Medication administration.  Evaluation of Outcomes: Adequate for Discharge   RN Treatment Plan for Primary Diagnosis: Schizoaffective disorder, bipolar type (HCC) Long Term Goal(s): Knowledge of disease and therapeutic regimen to maintain health will improve  Short Term Goals: Ability to identify and develop effective coping behaviors will improve and Compliance with prescribed medications will improve  Medication Management: RN will administer medications as ordered by provider, will assess and evaluate patient's response and provide education to patient for prescribed medication. RN will report any adverse and/or side effects to prescribing provider.  Therapeutic Interventions: 1 on 1 counseling sessions, Psychoeducation, Medication administration, Evaluate responses to treatment, Monitor vital signs and CBGs as ordered, Perform/monitor CIWA, COWS, AIMS and Fall Risk screenings as ordered, Perform wound care treatments as ordered.  Evaluation of Outcomes: Adequate for Discharge   LCSW Treatment Plan for Primary Diagnosis: Schizoaffective disorder, bipolar type (HCC) Long Term Goal(s): Safe transition to appropriate next level of care at discharge, Engage patient in therapeutic group addressing interpersonal concerns.  Short Term Goals: Engage patient in aftercare planning with referrals and resources  Therapeutic Interventions: Assess for  all discharge needs, 1 to 1 time with Social worker, Explore available resources and support systems, Assess for adequacy in community support network, Educate family and significant other(s) on suicide prevention, Complete Psychosocial Assessment, Interpersonal group therapy.  Evaluation of Outcomes: Met  Return home, follow up [facility]   Progress in Treatment: Attending groups: Yes Participating in groups: Yes Taking medication as prescribed: Yes Toleration medication: Yes, no side effects reported at this time Family/Significant other contact made: Yes Patient understands diagnosis: Yes AEB asking for help "getting back on track" Discussing patient identified problems/goals with staff: Yes Medical problems stabilized or resolved: Yes Denies suicidal/homicidal ideation: Yes Issues/concerns per patient self-inventory: None Other: N/A  New problem(s) identified: None identified at this time.   New Short Term/Long Term Goal(s): None identified at this time.   Discharge Plan or Barriers:   Reason for Continuation of Hospitalization:  Medication stabilization   Estimated Length of Stay: Likely d/c tomorrow, Saturday at the latest  Attendees: Patient: 09/30/2016  10:11 AM  Physician: V Izediuno, MD 09/30/2016  10:11 AM  Nursing: Elizabeth Iwenkha, RN 09/30/2016  10:11 AM  RN Care Manager: Jennifer Clark, RN 09/30/2016  10:11 AM  Social Worker: Rod  09/30/2016  10:11 AM  Recreational Therapist: Marjette  09/30/2016  10:11 AM    Other: Norberto Sorenson 09/30/2016  10:11 AM  Other:  09/30/2016  10:11 AM    Scribe for Treatment Team:  Roque Lias LCSW 09/30/2016 10:11 AM

## 2016-09-30 NOTE — Progress Notes (Signed)
Patient attended karaoke group tonight.  

## 2016-10-01 LAB — HEMOGLOBIN A1C
HEMOGLOBIN A1C: 6.3 % — AB (ref 4.8–5.6)
MEAN PLASMA GLUCOSE: 134 mg/dL

## 2016-10-01 LAB — BASIC METABOLIC PANEL
ANION GAP: 9 (ref 5–15)
BUN: 12 mg/dL (ref 6–20)
CALCIUM: 9.4 mg/dL (ref 8.9–10.3)
CO2: 23 mmol/L (ref 22–32)
CREATININE: 0.91 mg/dL (ref 0.61–1.24)
Chloride: 96 mmol/L — ABNORMAL LOW (ref 101–111)
GFR calc Af Amer: >60 mL/min (ref >60)
Glucose, Bld: 79 mg/dL (ref 65–99)
Potassium: 3.9 mmol/L (ref 3.5–5.1)
SODIUM: 128 mmol/L — AB (ref 135–145)

## 2016-10-01 LAB — PROLACTIN: Prolactin: 39.4 ng/mL — ABNORMAL HIGH (ref 4.0–15.2)

## 2016-10-01 NOTE — BHH Group Notes (Signed)
[place] LCSW Group Therapy  10/01/2016  1:05 PM  Type of Therapy:  Group therapy  Participation Level:  Active  Participation Quality:  Attentive  Affect:  Flat  Cognitive:  Oriented  Insight:  Limited  Engagement in Therapy:  Limited  Modes of Intervention:  Discussion, Socialization  Summary of Progress/Problems:  Chaplain was here to lead a group on themes of hope and courage. "Hope is looking forward to something in the future.  I'm looking forward to going home.  But I've also appreciated the help I got here."  Got agitated a bit later when he was asked about his family.  "If anyone messes with my family, they will surely face the [place]." Later stated he believes everyone is an Training and development officer. "I see beauty and art all around me."  Trish Mage 10/01/2016 1:57 PM

## 2016-10-01 NOTE — Progress Notes (Signed)
Pt put his hand in the pitcher ice water to get ice. Staff remind pt that is inapporiate. RN was notify.

## 2016-10-01 NOTE — Plan of Care (Addendum)
  Problem: Self-Care: Goal: Ability to participate in self-care as condition permits will improve Outcome: Progressing Pt. took a shower this evening provided with encouragement.

## 2016-10-01 NOTE — Progress Notes (Signed)
[facility] MD Progress Note  10/01/2016 4:42 PM Joshua [name]  MRN:  [id number] Subjective:   61 yo Caucasian male, unemployed,  married, lives with his family. History of Bipolar Disorder since hs early twenties. Presented to the ER initially complaining of headaches. Later expressed grandiose delusions. He believes that he is [employer]. He believes he has magical powers as he could read peoples mind and their future. He exhibited bizarre mannerisms. He was noted to be responding to internal stimuli at the ER. Collateral from his family indicates that had been off medications. He has not been sleeping well at night. UDS was positive for benzodiazepines and THC. Presented with electrolyte derangement which was addressed at the ER.   Chart reviewed today. Patient discussed at team  Staff reports that he has been doing well. He is pleasant and cooperative. Polite and engages with peers. Has not required any PRN's. No behavioral issues. Takes his medications as prescribed.  His wife came to visit and confirms that he is back his old self. Family would take him back tomorrow.   Seen today. Feels good. Wants to be back with his family. No thoughts of violence. No delusional preoccupation. No side effects from his medication.   Principal Problem: Schizoaffective disorder, bipolar type ([place]) Diagnosis:   Patient Active Problem List   Diagnosis Date Noted  . Hyponatremia [E87.1] 09/29/2016  . Hypokalemia [E87.6] 09/29/2016  . Schizoaffective disorder, bipolar type ([place]) [F25.0] 05/10/2016  . Cannabis use disorder, moderate, dependence ([place]) [F12.20] 05/10/2016  . Generalized anxiety disorder [F41.1] 05/04/2016   Total Time spent with patient: 20 minutes  Past Psychiatric History: As in H&P  Past Medical History:  Past Medical History:  Diagnosis Date  . Back pain   . Bipolar disorder ([place])   . Cannabis use disorder, moderate, dependence ([place])   . Chronic pain   . Generalized anxiety disorder    . Hypertension   . Schizoaffective disorder [facility])     Past Surgical History:  Procedure Laterality Date  . APPENDECTOMY     Family History:  Family History  Problem Relation Age of Onset  . Mental illness Neg Hx    Family Psychiatric  History: As in H&P Social History:  History  Alcohol Use  . Yes    Comment: occ     History  Drug Use  . Types: Marijuana    Comment: daily    Social History   Social History  . Marital status: Single    Spouse name: N/A  . Number of children: N/A  . Years of education: N/A   Social History Main Topics  . Smoking status: Current Every Day Smoker    Packs/day: 2.00    Types: Cigarettes  . Smokeless tobacco: Never Used  . Alcohol use Yes     Comment: occ  . Drug use: Yes    Types: Marijuana     Comment: daily  . Sexual activity: Not Asked   Other Topics Concern  . None   Social History Narrative  . None   Additional Social History:    Pain Medications: See MAR Prescriptions: See MAR Over the Counter: See MAR History of alcohol / drug use?: Yes Longest period of sobriety (when/how long): unknown Withdrawal Symptoms: Other (Comment) (Unknown) Name of Substance 1: Marijuana 1 - Age of First Use: 14 1 - Amount (size/oz): Unsure 1 - Frequency: unable to answer  1 - Duration: on and off for year 1 - Last Use / Amount:  unknown                  Sleep: Good  Appetite:  Good  Current Medications: Current Facility-Administered Medications  Medication Dose Route Frequency Provider Last Rate Last Dose  . acetaminophen (TYLENOL) tablet 650 mg  650 mg Oral Q6H PRN Okonkwo, Justina A, NP   650 mg at 10/01/16 0630  . alum & mag hydroxide-simeth (MAALOX/MYLANTA) 200-200-20 MG/5ML suspension 30 mL  30 mL Oral Q4H PRN Okonkwo, Justina A, NP      . glipiZIDE (GLUCOTROL) tablet 10 mg  10 mg Oral BID WC Okonkwo, Justina A, NP   10 mg at 10/01/16 0743  . hydrochlorothiazide (HYDRODIURIL) tablet 25 mg  25 mg Oral BH-q7a Okonkwo,  Justina A, NP   25 mg at 10/01/16 0630  . hydrOXYzine (ATARAX/VISTARIL) tablet 50 mg  50 mg Oral TID PRN Hughie Closs A, NP   50 mg at 09/30/16 2113  . lisinopril (PRINIVIL,ZESTRIL) tablet 40 mg  40 mg Oral BH-q7a Okonkwo, Justina A, NP   40 mg at 10/01/16 0630  . magnesium hydroxide (MILK OF MAGNESIA) suspension 30 mL  30 mL Oral Daily PRN Okonkwo, Justina A, NP      . mometasone-formoterol (DULERA) 200-5 MCG/ACT inhaler 2 puff  2 puff Inhalation BID Okonkwo, Justina A, NP   2 puff at 10/01/16 0745  . nicotine polacrilex (NICORETTE) gum 2 mg  2 mg Oral PRN Ursula Alert, MD   2 mg at 10/01/16 1624  . OLANZapine zydis (ZYPREXA) disintegrating tablet 15 mg  15 mg Oral QHS Jesten Cappuccio, Laruth Bouchard, MD   15 mg at 09/30/16 2113  . traZODone (DESYREL) tablet 50 mg  50 mg Oral QHS PRN,MR X 1 Okonkwo, Justina A, NP   50 mg at 09/30/16 2113    Lab Results:  Results for orders placed or performed during the hospital encounter of 09/28/16 (from the past 48 hour(s))  CBC with Differential/Platelet     Status: Abnormal   Collection Time: 09/30/16  6:51 AM  Result Value Ref Range   WBC 10.1 4.0 - 10.5 K/uL   RBC 4.34 4.22 - 5.81 MIL/uL   Hemoglobin 13.9 13.0 - 17.0 g/dL   HCT 39.7 39.0 - 52.0 %   MCV 91.5 78.0 - 100.0 fL   MCH 32.0 26.0 - 34.0 pg   MCHC 35.0 30.0 - 36.0 g/dL   RDW 14.2 11.5 - 15.5 %   Platelets 231 150 - 400 K/uL   Neutrophils Relative % 61 %   Neutro Abs 6.1 1.7 - 7.7 K/uL   Lymphocytes Relative 28 %   Lymphs Abs 2.8 0.7 - 4.0 K/uL   Monocytes Relative 10 %   Monocytes Absolute 1.1 (H) 0.1 - 1.0 K/uL   Eosinophils Relative 1 %   Eosinophils Absolute 0.1 0.0 - 0.7 K/uL   Basophils Relative 0 %   Basophils Absolute 0.0 0.0 - 0.1 K/uL    Comment: Performed at [facility], [place] [address] [place], [place] [address]  Comprehensive metabolic panel     Status: Abnormal   Collection Time: 09/30/16  6:51 AM  Result Value Ref Range   Sodium 130 (L) 135 - 145  mmol/L   Potassium 4.0 3.5 - 5.1 mmol/L   Chloride 98 (L) 101 - 111 mmol/L   CO2 23 22 - 32 mmol/L   Glucose, Bld 85 65 - 99 mg/dL   BUN 15 6 - 20 mg/dL   Creatinine, Ser 1.04  0.61 - 1.24 mg/dL   Calcium 9.2 8.9 - 10.3 mg/dL   Total Protein 6.2 (L) 6.5 - 8.1 g/dL   Albumin 3.7 3.5 - 5.0 g/dL   AST 43 (H) 15 - 41 U/L   ALT 29 17 - 63 U/L   Alkaline Phosphatase 60 38 - 126 U/L   Total Bilirubin 0.5 0.3 - 1.2 mg/dL   GFR calc non Af Amer >60 >60 mL/min   GFR calc Af Amer >60 >60 mL/min    Comment: (NOTE) The eGFR has been calculated using the CKD EPI equation. This calculation has not been validated in all clinical situations. eGFR's persistently <60 mL/min signify possible Chronic Kidney Disease.    Anion gap 9 5 - 15    Comment: Performed at [facility], [place] [address] [place], [place] [address]  TSH     Status: None   Collection Time: 09/30/16  6:51 AM  Result Value Ref Range   TSH 0.639 0.350 - 4.500 uIU/mL    Comment: Performed by a 3rd Generation assay with a functional sensitivity of <=0.01 uIU/mL. Performed at [facility], [place] [address] [place], [place] [address]   Lipid panel     Status: Abnormal   Collection Time: 09/30/16  6:51 AM  Result Value Ref Range   Cholesterol 111 0 - 200 mg/dL   Triglycerides 176 (H) <150 mg/dL   HDL 34 (L) >40 mg/dL   Total CHOL/HDL Ratio 3.3 RATIO   VLDL 35 0 - 40 mg/dL   LDL Cholesterol 42 0 - 99 mg/dL    Comment:        Total Cholesterol/HDL:CHD Risk Coronary Heart Disease Risk Table                     Men   Women  1/2 Average Risk   3.4   3.3  Average Risk       5.0   4.4  2 X Average Risk   9.6   7.1  3 X Average Risk  23.4   11.0        Use the calculated Patient Ratio above and the CHD Risk Table to determine the patient's CHD Risk.        ATP III CLASSIFICATION (LDL):  <100     mg/dL   Optimal  100-129  mg/dL   Near or Above                    Optimal  130-159  mg/dL    Borderline  160-189  mg/dL   High  >190     mg/dL   Very High Performed at [place] [address], [place], [place] [address]   Hemoglobin A1c     Status: Abnormal   Collection Time: 09/30/16  6:51 AM  Result Value Ref Range   Hgb A1c MFr Bld 6.3 (H) 4.8 - 5.6 %    Comment: (NOTE)         Pre-diabetes: 5.7 - 6.4         Diabetes: >6.4         Glycemic control for adults with diabetes: <7.0    Mean Plasma Glucose 134 mg/dL    Comment: (NOTE) Performed At: [facility] [place], [place] [id number] Lindon Romp MD OZ:[id number] Performed at [facility], [place] [address], [place], [place] [address]   Prolactin     Status: Abnormal  Collection Time: 09/30/16  6:51 AM  Result Value Ref Range   Prolactin 39.4 (H) 4.0 - 15.2 ng/mL    Comment: (NOTE) Performed At: [facility] [place], [place] [id number] Lindon Romp MD OI:[id number] Performed at [facility] [place], [place] [address] [place], [place] [address]   Basic metabolic panel     Status: Abnormal   Collection Time: 10/01/16  6:21 AM  Result Value Ref Range   Sodium 128 (L) 135 - 145 mmol/L   Potassium 3.9 3.5 - 5.1 mmol/L   Chloride 96 (L) 101 - 111 mmol/L   CO2 23 22 - 32 mmol/L   Glucose, Bld 79 65 - 99 mg/dL   BUN 12 6 - 20 mg/dL   Creatinine, Ser 0.91 0.61 - 1.24 mg/dL   Calcium 9.4 8.9 - 10.3 mg/dL   GFR calc non Af Amer >60 >60 mL/min   GFR calc Af Amer >60 >60 mL/min    Comment: (NOTE) The eGFR has been calculated using the CKD EPI equation. This calculation has not been validated in all clinical situations. eGFR's persistently <60 mL/min signify possible Chronic Kidney Disease.    Anion gap 9 5 - 15    Comment: Performed at [facility], [place] [address], [place], [place] [address]    Blood Alcohol level:  Lab Results  Component Value Date   [facility] <5 09/28/2016   ETH <5 67/[birth year]1937     Metabolic Disorder Labs: Lab Results  Component Value Date   HGBA1C 6.3 (H) 09/30/2016   MPG 134 09/30/2016   Lab Results  Component Value Date   PROLACTIN 39.4 (H) 09/30/2016   Lab Results  Component Value Date   CHOL 111 09/30/2016   TRIG 176 (H) 09/30/2016   HDL 34 (L) 09/30/2016   CHOLHDL 3.3 09/30/2016   VLDL 35 09/30/2016   LDLCALC 42 09/30/2016    Physical Findings: AIMS: Facial and Oral Movements Muscles of Facial Expression: None, normal Lips and Perioral Area: None, normal Jaw: None, normal Tongue: None, normal,Extremity Movements Upper (arms, wrists, hands, fingers): None, normal Lower (legs, knees, ankles, toes): None, normal, Trunk Movements Neck, shoulders, hips: None, normal, Overall Severity Severity of abnormal movements (highest score from questions above): None, normal Incapacitation due to abnormal movements: None, normal Patient's awareness of abnormal movements (rate only patient's report): No Awareness, Dental Status Current problems with teeth and/or dentures?: No Does patient usually wear dentures?: No  CIWA:    COWS:     Musculoskeletal: Strength & Muscle Tone: within normal limits Gait & Station: normal Patient leans: N/A  Psychiatric Specialty Exam: Physical Exam  Constitutional: He is oriented to person, place, and time. He appears well-developed and well-nourished.  HENT:  Head: Normocephalic and atraumatic.  Eyes: Conjunctivae are normal. Pupils are equal, round, and reactive to light.  Neck: Normal range of motion. Neck supple.  Cardiovascular: Normal rate and regular rhythm.   Respiratory: Effort normal.  GI: Soft.  Musculoskeletal: Normal range of motion.  Neurological: He is alert and oriented to person, place, and time.  Skin: Skin is warm and dry.  Psychiatric:  As above    ROS  Blood pressure 121/79, pulse (!) 103, temperature 97.6 F (36.4 C), temperature source Oral, resp. rate 16, height '5\' 8"'$  (1.727 m), weight  86.2 kg (190 lb).Body mass index is 28.89 kg/m.  General Appearance: Calm and cooperative  Eye Contact:  Good  Speech:  Clear and Coherent and Normal Rate  Volume:  Normal  Mood:  Euthymic  Affect:  Appropriate and Restricted  Thought Process:  Linear  Orientation:  Full (Time, Place, and Person)  Thought Content:  No delusional theme. No preoccupation with violent thoughts. No negative ruminations. No obsession.  No hallucination in any modality.   Suicidal Thoughts:  No  Homicidal Thoughts:  No  Memory:  Immediate;   Good Recent;   Good Remote;   Good  Judgement:  Good  Insight:  Good  Psychomotor Activity:  Normal  Concentration:  Concentration: Good and Attention Span: Good  Recall:  Good  Fund of Knowledge:  Good  Language:  Good  Akathisia:  Negative  Handed:    AIMS (if indicated):     Assets:  Communication Skills Desire for Improvement Housing Intimacy Resilience  ADL's:  Better  Cognition:  WNL  Sleep:  Number of Hours: 6.5     Treatment Plan Summary: Patient has improved with medications. No dangerousness. Family comfortable with him and wants him home tomorrow. Scheduled for discharge tomorrow.    Psychiatric: Bipolar I Disorder current episode is mania SUD ([facility]C)  Medical: HTN Hyponatremia ,,, corrected.  Hypokalemia ,,, resolving  Psychosocial:   PLAN: 1. Continue current regimen 2. Home tomorrow.   [place], MD 10/01/2016, 4:42 PM

## 2016-10-01 NOTE — Progress Notes (Signed)
Pt attend wrap up group. His day was a 10.  Pt said his goal was to go home before he go he wants to get his medicine.

## 2016-10-01 NOTE — Progress Notes (Signed)
  [facility] Adult Case Management Discharge Plan :  Will you be returning to the same living situation after discharge:  Yes,  home At discharge, do you have transportation home?: Yes,  wife Do you have the ability to pay for your medications: Yes,  MCR  Release of information consent forms completed and in the chart;  Patient's signature needed at discharge.  Patient to Follow up at: Follow-up Information    [facility] Follow up on 10/06/2016.   Specialty:  Behavioral Health Why:  Wednesday at 11:45 for your hospital follow up appointment.  Bring photo ID, hospital d/c paperwork and insurance card. Contact information: [place] [address] [phone]           Next level of care provider has access to Hope and Suicide Prevention discussed: Yes,  yes  Have you used any form of tobacco in the last 30 days? (Cigarettes, Smokeless Tobacco, Cigars, and/or Pipes): Yes  Has patient been referred to the Quitline?: Patient refused referral  Patient has been referred for addiction treatment: Pt. refused referral  Trish Mage 10/01/2016, 1:30 PM

## 2016-10-01 NOTE — Progress Notes (Signed)
DAR NOTE: Patient presents with anxious affect and mood.  Denies auditory and visual hallucinations.  Described energy level as normal and concentration as good.  Rates depression at 10, hopelessness at 0, and anxiety at 0.  Maintained on routine safety checks.  Medications given as prescribed.  Support and encouragement offered as needed.  Attended group and participated.  States goal for today is "to get well and go home."  Staff reports patient with bizarre behavior on the courtyard.  Patient urinated in the corner at the courtyard in view of other patients.  Patient placed on unit restriction for 24 hours.

## 2016-10-01 NOTE — Progress Notes (Signed)
  DATA ACTION RESPONSE  Objective- Pt. is visible in the dayroom, seen eating a snack. Presents with an anxious affect and mood. Appears preoccupied with discharge and restless. 72 hr request for D/C expire on 10/02/16 at 1202. No further c/o. No abnormal s/s.  Subjective- Denies having any SI/HI/AVH at this time. Rates pain 7/10; Generalized. Pt. states " I hope to go home tomorrow". Is cooperative and remain safe on the unit.  1:1 interaction in private to establish rapport. Encouragement, education, & support given from staff.  PRN Vistaril,Tylenol, and Trazodone requested and will re-eval accordingly.   Safety maintained with Q 15 checks. Continue with POC.

## 2016-10-01 NOTE — [facility] Suicide Risk Assessment (Signed)
[place] INPATIENT:  Family/Significant Other Suicide Prevention Education  Suicide Prevention Education:  Education Completed; Senai Kingsley, [phone], wife,  has been identified by the patient as the family member/significant other with whom the patient will be residing, and identified as the person(s) who will aid the patient in the event of a mental health crisis (suicidal ideations/suicide attempt).  With written consent from the patient, the family member/significant other has been provided the following suicide prevention education, prior to the and/or following the discharge of the patient.  The suicide prevention education provided includes the following:  Suicide risk factors  Suicide prevention and interventions  National Suicide Hotline telephone number  [facility] assessment telephone number  [facility] Emergency Assistance [facility] and/or Residential Mobile Crisis Unit telephone number  Request made of family/significant other to:  Remove weapons (e.g., guns, rifles, knives), all items previously/currently identified as safety concern.    Remove drugs/medications (over-the-counter, prescriptions, illicit drugs), all items previously/currently identified as a safety concern.  The family member/significant other verbalizes understanding of the suicide prevention education information provided.  The family member/significant other agrees to remove the items of safety concern listed above. The patient did not endorse SI at the time of admission, nor did the patient c/o SI during the stay here.  SPE not required.  Talked to wife with pt present.  She is happy with his progress, and will welcome him home at release.  We went over treatment team recommendations and crises plan. Trish Mage 10/01/2016, 9:57 AM

## 2016-10-01 NOTE — Progress Notes (Signed)
Recreation Therapy Notes  Date: 10/01/16 Time: 1000 Location: 500 Hall Dayroom  Group Topic: Communication, Team Building, Problem Solving  Goal Area(s) Addresses:  Patient will effectively work with peer towards shared goal.  Patient will identify skill used to make activity successful.  Patient will identify how skills used during activity can be used to reach post d/c goals.   Intervention: STEM Activity   Activity: [employer]. Patients were provided the following materials: 5 drinking straws, 5 rubber bands, 5 paper clips, 2 index cards, 2 drinking cups, and 2 toilet paper rolls. Using the provided materials patients were asked to build a launching mechanisms to launch a ping pong ball approximately 12 feet. Patients were divided into teams of 3-5.   Education: Education officer, community, Dentist.   Education Outcome: Acknowledges education/In group clarification offered/Needs additional education.   Clinical Observations/Feedback: Pt did not attend group.   [name], LRT/CTRS         Ria Comment, Susie Pousson A 10/01/2016 11:55 AM

## 2016-10-02 MED ORDER — GLIPIZIDE 10 MG PO TABS: 10.0000 mg | ORAL_TABLET | Freq: Two times a day (BID) | ORAL | 0 refills | Status: DC

## 2016-10-02 MED ORDER — OLANZAPINE 15 MG PO TBDP: 15.0000 mg | ORAL_TABLET | Freq: Every day | ORAL | 0 refills | Status: DC

## 2016-10-02 MED ORDER — MOMETASONE FURO-FORMOTEROL FUM 200-5 MCG/ACT IN AERO: 2.0000 | INHALATION_SPRAY | Freq: Two times a day (BID) | RESPIRATORY_TRACT | Status: DC

## 2016-10-02 MED ORDER — TRAZODONE HCL 50 MG PO TABS: ORAL_TABLET | ORAL | 0 refills | Status: DC

## 2016-10-02 MED ORDER — LISINOPRIL 40 MG PO TABS: 40.0000 mg | ORAL_TABLET | ORAL | 0 refills | Status: DC

## 2016-10-02 MED ORDER — HYDROCHLOROTHIAZIDE 25 MG PO TABS: 25.0000 mg | ORAL_TABLET | ORAL | 0 refills | Status: DC

## 2016-10-02 MED ORDER — HYDROXYZINE HCL 50 MG PO TABS: 50.0000 mg | ORAL_TABLET | Freq: Three times a day (TID) | ORAL | 0 refills | Status: DC | PRN

## 2016-10-02 MED ORDER — NICOTINE POLACRILEX 2 MG MT GUM: 2.0000 mg | CHEWING_GUM | OROMUCOSAL | 0 refills | Status: DC | PRN

## 2016-10-02 NOTE — Discharge Summary (Signed)
Physician Discharge Summary Note  Patient:  Joshua [name] is an 61 y.o., male MRN:  [id number] DOB:  20-Jul-1955 Patient phone:  [phone] (home)  Patient address:   [place] [address],  Total Time spent with patient: Greater than 30 minutes  Date of Admission:  09/28/2016  Date of Discharge: 10-02-16  Reason for Admission: Grandiose delusions saying he is [name].  Principal Problem: Schizoaffective disorder, bipolar type [facility])  Discharge Diagnoses: Patient Active Problem List   Diagnosis Date Noted  . Hyponatremia [E87.1] 09/29/2016  . Hypokalemia [E87.6] 09/29/2016  . Schizoaffective disorder, bipolar type ([place]) [F25.0] 05/10/2016  . Cannabis use disorder, moderate, dependence ([place]) [F12.20] 05/10/2016  . Generalized anxiety disorder [F41.1] 05/04/2016   Past Psychiatric History: Schizoaffective disorder, Cannabis use disorder.  Past Medical History:  Past Medical History:  Diagnosis Date  . Back pain   . Bipolar disorder ([place])   . Cannabis use disorder, moderate, dependence ([place])   . Chronic pain   . Generalized anxiety disorder   . Hypertension   . Schizoaffective disorder [facility])     Past Surgical History:  Procedure Laterality Date  . APPENDECTOMY     Family History:  Family History  Problem Relation Age of Onset  . Mental illness Neg Hx    Family Psychiatric  History: See H&P  Social History:  History  Alcohol Use  . Yes    Comment: occ     History  Drug Use  . Types: Marijuana    Comment: daily    Social History   Social History  . Marital status: Single    Spouse name: N/A  . Number of children: N/A  . Years of education: N/A   Social History Main Topics  . Smoking status: Current Every Day Smoker    Packs/day: 2.00    Types: Cigarettes  . Smokeless tobacco: Never Used  . Alcohol use Yes     Comment: occ  . Drug use: Yes    Types: Marijuana     Comment: daily  . Sexual activity: Not Asked   Other Topics  Concern  . None   Social History Narrative  . None   Hospital Course: Joshua [name] is a 61 year old Caucasian male, unemployed, married, lives with his family. History of Bipolar Disorder since hs early twenties. Presented to the ER initially complaining of headaches. Later expressed grandiose delusions. He believes that he is [employer]. He believes he has magical powers as he could read peoples mind and their future. He exhibited bizarre mannerisms. He was noted to be responding to internal stimuli at the ER. Collateral from his family indicates that had been off medications. He has not been sleeping well at night. UDS was positive for benzodiazepines and THC. Presented with electrolyte derangement which was addressed at the ER.   After evaluation of his presenting symptoms, Joshua [name] was re-started on medication regimen for his presenting symptoms. He was medicated & discharged on; Hydroxyzine 50 mg prn for anxiety, Nicorette gum 2 mg for smoking cessation, Olanzapine Zydis 15 mg for mood control & Trazodone 50 mg for insomnia. He was enrolled & participated in the group counseling sessions being offered & held on this unit. He learned coping skills. He also received other medication regimen for the other medical issues presented. He tolerated his treatment regimen without any adverse effects or reactions reported.  Joshua [name] was seen by his psychiatrist today. Reports that he is in good spirits. Not feeling depressed. Reports normal  energy and interest. Has been maintaining normal biological functions. He is able to think clearly. He is able to focus on task. His thoughts are not crowded or racing. No evidence of mania. No hallucination in any modality. He is not making any delusional statement. No passivity of will/thought. He is fully in touch with reality. No thoughts of suicide. No thoughts of homicide. No violent thoughts. No overwhelming anxiety.  The Nursing staff reports that patient has been  appropriate on the unit. Patient has been interacting well with peers. No behavioral issues. Patient has not voiced any suicidal thoughts. Patient has not been observed to be internally stimulated. Patient has been adherent with treatment recommendations. Patient has been tolerating their medication well.   Joshua [name]'s case was discussed at the treatment team meeting this am. The team members feel that patient is back to his baseline level of function. Team agrees with plan to discharge patient today to continue mental health on outpatient basis as noted below. He is provided with all the necessary information needed to make this appointment without any problems. He [facility] with all personal belongings in no apparent distress. Transportation per family.  Physical Findings: AIMS: Facial and Oral Movements Muscles of Facial Expression: None, normal Lips and Perioral Area: None, normal Jaw: None, normal Tongue: None, normal,Extremity Movements Upper (arms, wrists, hands, fingers): None, normal Lower (legs, knees, ankles, toes): None, normal, Trunk Movements Neck, shoulders, hips: None, normal, Overall Severity Severity of abnormal movements (highest score from questions above): None, normal Incapacitation due to abnormal movements: None, normal Patient's awareness of abnormal movements (rate only patient's report): No Awareness, Dental Status Current problems with teeth and/or dentures?: No Does patient usually wear dentures?: No  CIWA:    COWS:     Musculoskeletal: Strength & Muscle Tone: within normal limits Gait & Station: normal Patient leans: N/A  Psychiatric Specialty Exam: Physical Exam  Constitutional: He appears well-developed.  HENT:  Head: Normocephalic.  Eyes: Pupils are equal, round, and reactive to light.  Neck: Normal range of motion.  Cardiovascular:  Elevated pulse pressure  Respiratory: Effort normal.  GI: Soft.  Genitourinary:  Genitourinary Comments: Deferred   Musculoskeletal: Normal range of motion.  Neurological: He is alert.  Skin: Skin is warm.    Review of Systems  Constitutional: Negative.   HENT: Negative.   Eyes: Negative.   Cardiovascular: Negative.   Gastrointestinal: Negative.   Genitourinary: Negative.   Musculoskeletal: Negative.   Skin: Negative.   Neurological: Negative.   Endo/Heme/Allergies: Negative.   Psychiatric/Behavioral: Positive for depression (Stable) and substance abuse (Hx. Benzodiazepine & THC use). Negative for memory loss and suicidal ideas. The patient has insomnia (Stable). The patient is not nervous/anxious.     Blood pressure 110/77, pulse (!) 108, temperature 97.4 F (36.3 C), temperature source Oral, resp. rate 16, height 5\' 8"  (1.727 m), weight 86.2 kg (190 lb).Body mass index is 28.89 kg/m.  See Md's SRA   Have you used any form of tobacco in the last 30 days? (Cigarettes, Smokeless Tobacco, Cigars, and/or Pipes): Yes  Has this patient used any form of tobacco in the last 30 days? (Cigarettes, Smokeless Tobacco, Cigars, and/or Pipes): Yes, provided with nicorette gum prescription upon discharge to aid his smoking cessation.  Blood Alcohol level:  Lab Results  Component Value Date   [facility] <5 09/28/2016   ETH <5 32/20/2542   Metabolic Disorder Labs:  Lab Results  Component Value Date   HGBA1C 6.3 (H) 09/30/2016   MPG 134  09/30/2016   Lab Results  Component Value Date   PROLACTIN 39.4 (H) 09/30/2016   Lab Results  Component Value Date   CHOL 111 09/30/2016   TRIG 176 (H) 09/30/2016   HDL 34 (L) 09/30/2016   CHOLHDL 3.3 09/30/2016   VLDL 35 09/30/2016   LDLCALC 42 09/30/2016   See Psychiatric Specialty Exam and Suicide Risk Assessment completed by Attending Physician prior to discharge.  Discharge destination:  Home  Is patient on multiple antipsychotic therapies at discharge:  No   Has Patient had three or more failed trials of antipsychotic monotherapy by history:  No  Recommended  Plan for Multiple Antipsychotic Therapies: NA  Allergies as of 10/02/2016      Reactions   Imitrex [sumatriptan] Other (See Comments)   mania      Medication List    STOP taking these medications   ADVAIR DISKUS 250-50 MCG/DOSE Aepb Generic drug:  Fluticasone-Salmeterol   divalproex 500 MG 24 hr tablet Commonly known as:  DEPAKOTE ER   ferrous sulfate 325 (65 FE) MG tablet   risperiDONE 1 MG tablet Commonly known as:  RISPERDAL   traMADol 50 MG tablet Commonly known as:  ULTRAM   VENTOLIN HFA 108 (90 Base) MCG/ACT inhaler Generic drug:  albuterol     TAKE these medications     Indication  glipiZIDE 10 MG tablet Commonly known as:  GLUCOTROL Take 1 tablet (10 mg total) by mouth 2 (two) times daily with a meal. For diabetes What changed:  when to take this  additional instructions  Indication:  Type 2 Diabetes   hydrochlorothiazide 25 MG tablet Commonly known as:  HYDRODIURIL Take 1 tablet (25 mg total) by mouth every morning. For high blood pressure Start taking on:  10/03/2016 What changed:  additional instructions  Indication:  High Blood Pressure Disorder   hydrOXYzine 50 MG tablet Commonly known as:  ATARAX/VISTARIL Take 1 tablet (50 mg total) by mouth 3 (three) times daily as needed for anxiety.  Indication:  Anxiety Neurosis   lisinopril 40 MG tablet Commonly known as:  PRINIVIL,ZESTRIL Take 1 tablet (40 mg total) by mouth every morning. For high blood pressure Start taking on:  10/03/2016 What changed:  additional instructions  Indication:  High Blood Pressure Disorder   mometasone-formoterol 200-5 MCG/ACT Aero Commonly known as:  DULERA Inhale 2 puffs into the lungs 2 (two) times daily. For shortness of breath  Indication:  Asthma   nicotine polacrilex 2 MG gum Commonly known as:  NICORETTE Take 1 each (2 mg total) by mouth as needed for smoking cessation.  Indication:  Nicotine Addiction   olanzapine zydis 15 MG disintegrating  tablet Commonly known as:  ZYPREXA Take 1 tablet (15 mg total) by mouth at bedtime. For mood control  Indication:  Mood control   traZODone 50 MG tablet Commonly known as:  DESYREL Take 1 tablet (50 mg) at bedtime: For sleep  Indication:  Trouble Sleeping      Follow-up Information    [facility] Follow up on 10/06/2016.   Specialty:  Behavioral Health Why:  Wednesday at 11:45 for your hospital follow up appointment.  Bring photo ID, hospital d/c paperwork and insurance card. Contact information: [address] [address] [phone]          Follow-up recommendations: Activity:  As tolerated Diet: As recommended by your primary care doctor. Keep all scheduled follow-up appointments as recommended.   Comments: Patient is instructed prior to discharge to: Take all medications  as prescribed by his/her mental healthcare provider. Report any adverse effects and or reactions from the medicines to his/her outpatient provider promptly. Patient has been instructed & cautioned: To not engage in alcohol and or illegal drug use while on prescription medicines. In the event of worsening symptoms, patient is instructed to call the crisis hotline, 911 and or go to the nearest ED for appropriate evaluation and treatment of symptoms. To follow-up with his/her primary care provider for your other medical issues, concerns and or health care needs.   Signed: Encarnacion Slates, NP , PMHNP, FNP-BC 10/02/2016, 9:43 AM

## 2016-10-02 NOTE — Progress Notes (Signed)
D:  Patient denied SI and HI, contracts for safety.  Denied A/V hallucinations. A:  Patient refused his morning medications and then decided to take his medications later this morning.  Emotional support and encouragement given patient. R:  Safety maintained with 15 minute checks.   Patient looking forward to discharge today.

## 2016-10-02 NOTE — [facility] Suicide Risk Assessment (Signed)
[facility] Discharge Suicide Risk Assessment   Principal Problem: Schizoaffective disorder, bipolar type [facility]) Discharge Diagnoses:  Patient Active Problem List   Diagnosis Date Noted  . Hyponatremia [E87.1] 09/29/2016  . Hypokalemia [E87.6] 09/29/2016  . Schizoaffective disorder, bipolar type ([place]) [F25.0] 05/10/2016  . Cannabis use disorder, moderate, dependence ([place]) [F12.20] 05/10/2016  . Generalized anxiety disorder [F41.1] 05/04/2016    Total Time spent with patient: 45 minutes  Musculoskeletal: Strength & Muscle Tone: within normal limits Gait & Station: normal Patient leans: N/A  Psychiatric Specialty Exam: Review of Systems  Constitutional: Negative.   HENT: Negative.   Eyes: Negative.   Respiratory: Negative.   Cardiovascular: Negative.   Gastrointestinal: Negative.   Genitourinary: Negative.   Musculoskeletal: Negative.   Skin: Negative.   Neurological: Negative.   Endo/Heme/Allergies: Negative.   Psychiatric/Behavioral: Negative for depression, hallucinations, memory loss, substance abuse and suicidal ideas. The patient is not nervous/anxious and does not have insomnia.     Blood pressure 110/77, pulse (!) 108, temperature 97.4 F (36.3 C), temperature source Oral, resp. rate 16, height 5\' 8"  (1.727 m), weight 86.2 kg (190 lb).Body mass index is 28.89 kg/m.  General Appearance: Casuallydressed, pleasant, engaging well and cooperative. Appropriate behavior. Not in any distress. Good relatedness. Not internally stimulated  Eye Contact::  Good  Speech:  Spontaneous, normal prosody. Normal tone and rate.   Volume:  Normal  Mood:  Euthymic  Affect:  Appropriate and Full Range  Thought Process:  Goal Directed  Orientation:  Full (Time, Place, and Person)  Thought Content:  No delusional theme. No preoccupation with violent thoughts. No negative ruminations. No obsession.  No hallucination in any modality.   Suicidal Thoughts:  No  Homicidal Thoughts:  No  Memory:   Immediate;   Good Recent;   Good Remote;   Good  Judgement:  Good  Insight:  Good  Psychomotor Activity:  Normal  Concentration:  Good  Recall:  Good  Fund of Knowledge:Good  Language: Good  Akathisia:  Negative  Handed:    AIMS (if indicated):     Assets:  Communication Skills Desire for Improvement Financial Resources/Insurance Housing Intimacy Resilience  Sleep:  Number of Hours: 6.75  Cognition: WNL  ADL's:  Intact   Clinical Assessment::   61 yo Caucasian male, unemployed, married, lives with his family. History of Bipolar Disorder since hs early twenties. Presented to the ER initially complaining of headaches. Later expressed grandiose delusions. He believes that he is [employer]. He believes he has magical powers as he could read peoples mind and their future. He exhibited bizarre mannerisms. He was noted to be responding to internal stimuli at the ER. Collateral from his family indicates that had been off medications. He has not been sleeping well at night. UDS was positive for benzodiazepines and THC. Presented with electrolyte derangement which was addressed at the ER.   Seen today. Reports that he is in good spirits. Not feeling depressed. Reports normal energy and interest. Has been maintaining normal biological functions. He is able to think clearly. He is able to focus on task. His thoughts are not crowded or racing. No evidence of mania. No hallucination in any modality. He is not making any delusional statement. No passivity of will/thought. He is fully in touch with reality. No thoughts of suicide. No thoughts of homicide. No violent thoughts. No overwhelming anxiety.  Nursing staff reports that patient has been appropriate on the unit. Patient has been interacting well with peers. No behavioral issues.  Patient has not voiced any suicidal thoughts. Patient has not been observed to be internally stimulated. Patient has been adherent with treatment recommendations.  Patient has been tolerating their medication well.   Patient was discussed at team. Team members feels that patient is back to his baseline level of function. Team agrees with plan to discharge patient today.   Demographic Factors:  Male and Caucasian  Loss Factors: NA  Historical Factors: NA  Risk Reduction Factors:   Sense of responsibility to family, Living with another person, especially a relative, Positive social support, Positive therapeutic relationship and Positive coping skills or problem solving skills  Continued Clinical Symptoms:   As above  Cognitive Features That Contribute To Risk:  None    Suicide Risk:  Minimal: No identifiable suicidal ideation.  Patient is not having any thoughts of suicide at this time. Modifiable risk factors targeted during this admission includes psychosis and substance use. Demographical and historical risk factors cannot be modified. Patient is now engaging well. Patient is reliable and is future oriented. We have buffered patient's support structures. At this point, patient is at low risk of suicide. Patient is aware of the effects of psychoactive substances on decision making process. Patient has been provided with emergency contacts. Patient acknowledges to use resources provided if unforseen circumstances changes their current risk stratification.   Follow-up Information    [facility] Follow up on 10/06/2016.   Specialty:  Behavioral Health Why:  Wednesday at 11:45 for your hospital follow up appointment.  Bring photo ID, hospital d/c paperwork and insurance card. Contact information: [place] [address] [phone]           Plan Of Care/Follow-up recommendations:  1. Continue current psychotropic medications 2. Mental health and addiction follow up as arranged.  3. Discharge in care of his family 4. Provided limited quantity of prescriptions  [place], MD 10/02/2016, 9:08 AM

## 2016-10-02 NOTE — Progress Notes (Signed)
Discharge Note:  Patient discharged home with family member.  Patient denied SI and HI.  Denied A/V hallucinations.  Suicide prevention information given and discussed with patient who stated he understood and had no questions.  Patient stated he received all his belongings, clothing, toiletries, misc items,  prescriptions, etc.  Patient stated he appreciated all assistance received from [facility] staff.   All required discharge information given to patient at discharge.     

## 2016-10-06 DIAGNOSIS — F209 Schizophrenia, unspecified: Secondary | ICD-10-CM | POA: Diagnosis not present

## 2016-10-06 DIAGNOSIS — F312 Bipolar disorder, current episode manic severe with psychotic features: Secondary | ICD-10-CM | POA: Diagnosis not present

## 2016-10-13 DIAGNOSIS — F209 Schizophrenia, unspecified: Secondary | ICD-10-CM | POA: Diagnosis not present

## 2016-10-13 DIAGNOSIS — F312 Bipolar disorder, current episode manic severe with psychotic features: Secondary | ICD-10-CM | POA: Diagnosis not present

## 2016-10-14 ENCOUNTER — Ambulatory Visit (INDEPENDENT_AMBULATORY_CARE_PROVIDER_SITE_OTHER): Payer: Medicare Other | Admitting: Family Medicine

## 2016-10-14 ENCOUNTER — Encounter: Payer: Self-pay | Admitting: Family Medicine

## 2016-10-14 VITALS — BP 108/58 | HR 97 | Temp 98.7°F | Wt 204.8 lb

## 2016-10-14 DIAGNOSIS — F250 Schizoaffective disorder, bipolar type: Secondary | ICD-10-CM | POA: Diagnosis not present

## 2016-10-14 DIAGNOSIS — G894 Chronic pain syndrome: Secondary | ICD-10-CM | POA: Insufficient documentation

## 2016-10-14 DIAGNOSIS — E119 Type 2 diabetes mellitus without complications: Secondary | ICD-10-CM | POA: Diagnosis not present

## 2016-10-14 DIAGNOSIS — G43909 Migraine, unspecified, not intractable, without status migrainosus: Secondary | ICD-10-CM | POA: Insufficient documentation

## 2016-10-14 DIAGNOSIS — I10 Essential (primary) hypertension: Secondary | ICD-10-CM | POA: Insufficient documentation

## 2016-10-14 DIAGNOSIS — M1990 Unspecified osteoarthritis, unspecified site: Secondary | ICD-10-CM | POA: Insufficient documentation

## 2016-10-14 DIAGNOSIS — G43009 Migraine without aura, not intractable, without status migrainosus: Secondary | ICD-10-CM | POA: Insufficient documentation

## 2016-10-14 MED ORDER — VENTOLIN HFA 108 (90 BASE) MCG/ACT IN AERS: 2.0000 | INHALATION_SPRAY | RESPIRATORY_TRACT | 0 refills | Status: DC | PRN

## 2016-10-14 MED ORDER — HYDROCHLOROTHIAZIDE 25 MG PO TABS: 25.0000 mg | ORAL_TABLET | ORAL | 0 refills | Status: DC

## 2016-10-14 NOTE — Progress Notes (Signed)
Subjective:   Joshua [name] is a 61 y.o. male with a history of Chronic pain syndrome, hypertension, T2 DM, GAD, schizoaffective disorder bipolar type here for establishing care  HTN - taking HCTZ 75mg  daily - prescribed 25mg  daily - thought this might make him pee more which was desired - lisinopril 40 mg daily - Denies chest pain, shortness breath, lower extremity edema  Not taking Dulera - never picked up after hospitalization - is taking advair though - doesn't think that he has ever been diagnosed with COPD - asks multiple times if he can smoke in the exam room  Schizoaffective disorder - bipolar type, anxiety - followed by Joshua [name] - reports recent increase in trazodone dosing - also taking Zyprexa and atarax  Taking 6 iron pills daily because he "felt" anemic - discussed that he is not anemic and possible complications of taking too much iron - he states that he will continue to take as much as he wants - denies current constipation  Chronic pain syndrome - states no pain today - cannot specify where he has pain when he has it - quit seeing [facility] because he was only prescribed tramadol #90 per month - was previously prescribed tramadol #240 per month and was buying extra somewhere else - this was when he was seeing Joshua [name] - was terminated from the office 2/2 "acting like a nut" - some incident with females in the office - will not go into further detail   Review of Systems:  Per HPI.   Social History: current smoker  Objective:  BP (!) 108/58   Pulse 97   Temp 98.7 F (37.1 C) (Oral)   Wt 204 lb 12.8 oz (92.9 kg)   SpO2 96%   BMI 31.14 kg/m   Gen:  61 y.o. male in NAD HEENT: NCAT, MMM, EOMI, PERRL, anicteric sclerae CV: RRR, no MRG Resp: Non-labored, CTAB, no wheezes noted Abd: Soft, NTND, BS present, no guarding or organomegaly Ext: WWP, no edema MSK: No obvious deformities, gait intact Neuro: Alert and oriented, speech normal      Chemistry      Component Value Date/Time   NA 128 (L) 10/01/2016 0621   K 3.9 10/01/2016 0621   CL 96 (L) 10/01/2016 0621   CO2 23 10/01/2016 0621   BUN 12 10/01/2016 0621   CREATININE 0.91 10/01/2016 0621      Component Value Date/Time   CALCIUM 9.4 10/01/2016 0621   ALKPHOS 60 09/30/2016 0651   AST 43 (H) 09/30/2016 0651   ALT 29 09/30/2016 0651   BILITOT 0.5 09/30/2016 0651      Lab Results  Component Value Date   WBC 10.1 09/30/2016   HGB 13.9 09/30/2016   HCT 39.7 09/30/2016   MCV 91.5 09/30/2016   PLT 231 09/30/2016   Lab Results  Component Value Date   TSH 0.639 09/30/2016   Lab Results  Component Value Date   HGBA1C 6.3 (H) 09/30/2016   Assessment & Plan:     Joshua [name] is a 61 y.o. male here for   HTN (hypertension) Slightly low today Advised him to decrease his HCTZ dose back to 25 mg and not to change his medication doses before talking to his doctor Continue lisinopril 40 mg daily Follow-up in one month  T2DM (type 2 diabetes mellitus) (HCC) Recent A1c 6.3 earlier this month Continue glipizide at current dose  Chronic pain syndrome Unclear what patient's source or location of pain is and  he will not elaborate No pain today It seems that he has changed clinics in search of getting more pain medications He states multiple times that he needs more tramadol or something stronger Discussed that no control substances will be prescribed on his initial intake visit today Also discussed that other providers are unlikely to go up on his pain medications in this clinic He states that he will write a letter to his former doctor that was giving him 240 tramadol pills per month and try to be reinstated into his clinic Follow-up in one month  Schizoaffective disorder, bipolar type ([place]) Managed by Joshua [name], Joshua Bucy, MD MPH PGY-3,  [facility] 10/15/2016  10:09 AM

## 2016-10-15 ENCOUNTER — Other Ambulatory Visit: Payer: Self-pay | Admitting: *Deleted

## 2016-10-15 MED ORDER — GLIPIZIDE 10 MG PO TABS: 10.0000 mg | ORAL_TABLET | Freq: Two times a day (BID) | ORAL | 3 refills | Status: DC

## 2016-10-15 NOTE — Assessment & Plan Note (Signed)
Unclear what patient's source or location of pain is and he will not elaborate No pain today It seems that he has changed clinics in search of getting more pain medications He states multiple times that he needs more tramadol or something stronger Discussed that no control substances will be prescribed on his initial intake visit today Also discussed that other providers are unlikely to go up on his pain medications in this clinic He states that he will write a letter to his former doctor that was giving him 240 tramadol pills per month and try to be reinstated into his clinic Follow-up in one month

## 2016-10-15 NOTE — Telephone Encounter (Signed)
I have not formally seen this patient. Was seen by Dr. B at last visit. Would advise discussing this with Dr. B before she graduates otherwise patient will need to be seen by me. -- Harriet Butte, [place], PGY-1

## 2016-10-15 NOTE — Assessment & Plan Note (Signed)
Recent A1c 6.3 earlier this month Continue glipizide at current dose

## 2016-10-15 NOTE — Telephone Encounter (Signed)
Patient calling to request refill of:  Name of Medication(s):  Glipizide and migraine medication Last date of OV:  10/17/18/18 (with bacigalupo) Pharmacy:  [facility]  Will route refill request to MD.  Discussed with patient policy to call pharmacy for future refills.  Also, discussed refills may take up to 48 hours to approve or deny.  HARTSELL,  JAZMIN

## 2016-10-15 NOTE — Assessment & Plan Note (Signed)
Managed by [facility]

## 2016-10-15 NOTE — Assessment & Plan Note (Signed)
Slightly low today Advised him to decrease his HCTZ dose back to 25 mg and not to change his medication doses before talking to his doctor Continue lisinopril 40 mg daily Follow-up in one month

## 2016-10-16 ENCOUNTER — Ambulatory Visit (HOSPITAL_COMMUNITY)
Admission: EM | Admit: 2016-10-16 | Discharge: 2016-10-16 | Disposition: A | Discharge status: Home / Self Care | Payer: Medicare Other | Attending: Family Medicine | Admitting: Family Medicine

## 2016-10-16 ENCOUNTER — Encounter (HOSPITAL_COMMUNITY): Payer: Self-pay | Admitting: Emergency Medicine

## 2016-10-16 DIAGNOSIS — G44209 Tension-type headache, unspecified, not intractable: Secondary | ICD-10-CM | POA: Diagnosis not present

## 2016-10-16 MED ORDER — KETOROLAC TROMETHAMINE 60 MG/2ML IM SOLN
INTRAMUSCULAR | Status: AC
  Filled 2016-10-16: qty 2

## 2016-10-16 MED ORDER — KETOROLAC TROMETHAMINE 60 MG/2ML IM SOLN
60.0000 mg | Freq: Once | INTRAMUSCULAR | Status: AC
  Administered 2016-10-16: 60 mg via INTRAMUSCULAR

## 2016-10-16 MED ORDER — BUTALBITAL-APAP-CAFFEINE 50-325-40 MG PO TABS: 1.0000 | ORAL_TABLET | Freq: Four times a day (QID) | ORAL | 0 refills | Status: DC | PRN

## 2016-10-16 NOTE — ED Provider Notes (Signed)
CSN: [id number]     Arrival date & time 10/16/16  1425 History   First MD Initiated Contact with Patient 10/16/16 1513     Chief Complaint  Patient presents with  . Headache   (Consider location/radiation/quality/duration/timing/severity/associated sxs/prior Treatment) Patient c/o headache for 2 days.     The history is provided by the patient.  Headache  Pain location:  Occipital Quality:  Dull Radiates to:  Does not radiate Severity currently:  8/10 Severity at highest:  10/10 Onset quality:  Sudden Duration:  2 days Timing:  Constant Progression:  Worsening Chronicity:  New Similar to prior headaches: no   Relieved by:  Nothing Worsened by:  Nothing Ineffective treatments:  None tried   Past Medical History:  Diagnosis Date  . Back pain   . Bipolar disorder ([place])   . Cannabis use disorder, moderate, dependence ([place])   . Chronic pain   . Generalized anxiety disorder   . Hypertension   . Schizoaffective disorder [facility])    Past Surgical History:  Procedure Laterality Date  . APPENDECTOMY  1970   Family History  Problem Relation Age of Onset  . Diabetes Mother   . Cancer Mother        unsure what type  . Heart disease Mother   . Hypertension Mother   . Dementia Father   . Diabetes Brother   . Dementia Maternal Grandfather   . Cancer Paternal Grandmother 74       breast  . Mental illness Neg Hx    Social History  Substance Use Topics  . Smoking status: Current Every Day Smoker    Packs/day: 2.00    Types: Cigarettes  . Smokeless tobacco: Never Used  . Alcohol use Yes     Comment: occ    Review of Systems  Constitutional: Negative.   HENT: Negative.   Eyes: Negative.   Respiratory: Negative.   Cardiovascular: Negative.   Gastrointestinal: Negative.   Endocrine: Negative.   Genitourinary: Negative.   Musculoskeletal: Negative.   Allergic/Immunologic: Negative.   Neurological: Positive for headaches.  Hematological: Negative.    Psychiatric/Behavioral: Negative.     Allergies  Imitrex [sumatriptan]  Home Medications   Prior to Admission medications   Medication Sig Start Date End Date Taking? Authorizing Provider  butalbital-acetaminophen-caffeine (FIORICET, ESGIC) [id number] MG tablet Take 1-2 tablets by mouth every 6 (six) hours as needed for headache. 10/16/16 10/16/17  Lysbeth Penner, FNP  glipiZIDE (GLUCOTROL) 10 MG tablet Take 1 tablet (10 mg total) by mouth 2 (two) times daily with a meal. For diabetes 10/15/16    Bing, DO  hydrochlorothiazide (HYDRODIURIL) 25 MG tablet Take 1 tablet (25 mg total) by mouth every morning. For high blood pressure 10/14/16   Bacigalupo, Dionne Bucy, MD  hydrOXYzine (ATARAX/VISTARIL) 50 MG tablet Take 1 tablet (50 mg total) by mouth 3 (three) times daily as needed for anxiety. 10/02/16   Lindell Spar I, NP  lisinopril (PRINIVIL,ZESTRIL) 40 MG tablet Take 1 tablet (40 mg total) by mouth every morning. For high blood pressure 10/03/16   Nwoko, Herbert Pun I, NP  mometasone-formoterol (DULERA) 200-5 MCG/ACT AERO Inhale 2 puffs into the lungs 2 (two) times daily. For shortness of breath Patient not taking: Reported on 10/14/2016 10/02/16   Lindell Spar I, NP  nicotine polacrilex (NICORETTE) 2 MG gum Take 1 each (2 mg total) by mouth as needed for smoking cessation. Patient not taking: Reported on 10/14/2016 10/02/16   Lindell Spar I, NP  OLANZapine zydis (  ZYPREXA) 15 MG disintegrating tablet Take 1 tablet (15 mg total) by mouth at bedtime. For mood control 10/02/16   Lindell Spar I, NP  traZODone (DESYREL) 150 MG tablet Take 150 mg by mouth at bedtime. 10/13/16   [provider]  traZODone (DESYREL) 50 MG tablet Take 1 tablet (50 mg) at bedtime: For sleep 10/02/16   Lindell Spar I, NP  VENTOLIN HFA 108 (90 Base) MCG/ACT inhaler Inhale 2 puffs into the lungs every 4 (four) hours as needed. 10/14/16   Virginia Crews, MD   Meds Ordered and Administered this Visit   Medications   ketorolac (TORADOL) injection 60 mg (60 mg Intramuscular Given 10/16/16 1528)    BP (!) 159/83 (BP Location: Right Arm)   Pulse 89   Temp 98.3 F (36.8 C) (Oral)   Resp 17   Ht 5\' 8"  (1.727 m)   Wt 204 lb (92.5 kg)   SpO2 100%   BMI 31.02 kg/m  No data found.   Physical Exam  Constitutional: He is oriented to person, place, and time. He appears well-developed and well-nourished.  HENT:  Head: Normocephalic and atraumatic.  Right Ear: External ear normal.  Left Ear: External ear normal.  Nose: Nose normal.  Mouth/Throat: Oropharynx is clear and moist.  Eyes: Conjunctivae and EOM are normal. Pupils are equal, round, and reactive to light.  Neck: Normal range of motion. Neck supple.  Cardiovascular: Normal rate, regular rhythm and normal heart sounds.   Pulmonary/Chest: Effort normal and breath sounds normal.  Musculoskeletal: He exhibits tenderness.  TTP occipital region of scalp and to cervical paraspinous muscle.  Neurological: He is alert and oriented to person, place, and time.  Nursing note and vitals reviewed.   Urgent Care Course     Procedures (including critical care time)  Labs Review Labs Reviewed - No data to display  Imaging Review No results found.   Visual Acuity Review  Right Eye Distance:   Left Eye Distance:   Bilateral Distance:    Right Eye Near:   Left Eye Near:    Bilateral Near:         MDM   1. Tension headache    Toradol 60mg  IM Fioricet 1-2 tablets q 6 hours prn #20      Lysbeth Penner, FNP 10/16/16 1532

## 2016-10-16 NOTE — ED Triage Notes (Signed)
Pt. Stated, Joshua [name] had a headache since yesterday and worse today.

## 2016-10-18 NOTE — Telephone Encounter (Signed)
Patient given Fiorcet at Urgent Care over the weekend and advised to follow up with PCP, has appointment with PCP scheduled for 7/6.

## 2016-10-22 DIAGNOSIS — F312 Bipolar disorder, current episode manic severe with psychotic features: Secondary | ICD-10-CM | POA: Diagnosis not present

## 2016-10-22 DIAGNOSIS — F250 Schizoaffective disorder, bipolar type: Secondary | ICD-10-CM | POA: Diagnosis not present

## 2016-10-28 ENCOUNTER — Ambulatory Visit (HOSPITAL_COMMUNITY)
Admission: EM | Admit: 2016-10-28 | Discharge: 2016-10-28 | Disposition: A | Discharge status: Home / Self Care | Payer: Medicare Other | Attending: Family Medicine | Admitting: Family Medicine

## 2016-10-28 ENCOUNTER — Encounter (HOSPITAL_COMMUNITY): Payer: Self-pay | Admitting: Family Medicine

## 2016-10-28 DIAGNOSIS — R51 Headache: Secondary | ICD-10-CM

## 2016-10-28 DIAGNOSIS — G8929 Other chronic pain: Secondary | ICD-10-CM

## 2016-10-28 DIAGNOSIS — R519 Headache, unspecified: Secondary | ICD-10-CM

## 2016-10-28 MED ORDER — KETOROLAC TROMETHAMINE 60 MG/2ML IM SOLN
60.0000 mg | Freq: Once | INTRAMUSCULAR | Status: AC
  Administered 2016-10-28: 60 mg via INTRAMUSCULAR

## 2016-10-28 MED ORDER — AMOXICILLIN-POT CLAVULANATE 875-125 MG PO TABS: 1.0000 | ORAL_TABLET | Freq: Two times a day (BID) | ORAL | 0 refills | Status: DC

## 2016-10-28 MED ORDER — KETOROLAC TROMETHAMINE 60 MG/2ML IM SOLN
INTRAMUSCULAR | Status: AC
  Filled 2016-10-28: qty 2

## 2016-10-28 NOTE — ED Triage Notes (Signed)
Pt here for headache. sts that he is trying to quit marijuana and feeling sick.

## 2016-10-28 NOTE — Discharge Instructions (Signed)
Today you were diagnosed with the following: 1. Chronic nonintractable headache, unspecified headache type    You received the following medications:  Meds ordered this encounter  Medications   ketorolac (TORADOL) injection 60 mg   Please keep your appointment for tomorrow with your doctor. If you are not improving over the next few days or feel you are worsening please follow up here or the Emergency Department if you are unable to see your regular doctor.

## 2016-10-28 NOTE — Progress Notes (Signed)
   Subjective:   Patient ID: Joshua [name]    DOB: 1955-08-08, 61 y.o. male   MRN: [id number]  CC: "Foot blister"  HPI: Joshua [name] is a 61 y.o. male who presents to clinic today for right foot blister. Problems discussed today are as follows:  Right foot blister: Onset 3 days ago on right foot. Patient endorses using lotion and alcohol on blister. Blister has not popped. He did state that he was wearing shoes without socks which may have caused the blister. He does have diabetes but is controlled by A1c.  ROS: Denies fevers or chills, erythema or edema, loss of sensation or motor function.  Complete ROS performed, see HPI for pertinent.  [place]: HTN, controlled NIDDM, schizoaffective disorder, OA, GAD. H/o appendectomy. Mother h/o DM, unspecified CA, heart dz, HTN, father h/o dementia, borther h/o DM. Smoking status reviewed. Medications reviewed.  Objective:   BP 118/78   Pulse (!) 103   Temp 98 F (36.7 C) (Oral)   Ht 5\' 8"  (1.727 m)   Wt 203 lb 3.2 oz (92.2 kg)   SpO2 98%   BMI 30.90 kg/m  Vitals and nursing note reviewed.  General: well nourished, well developed, in no acute distress with non-toxic appearance HEENT: normocephalic, atraumatic, moist mucous membranes CV: regular rate and rhythm without murmurs, rubs, or gallops, no lower extremity edema Lungs: clear to auscultation bilaterally with normal work of breathing Skin: warm, dry, cap refill < 2 seconds, single intact blister on the dorsal aspect of third and fourth digit of right foot without erythema or edema Extremities: warm and well perfused, normal tone  Assessment & Plan:   Blister of right foot Acute. Does not appear infected. Afebrile. Blister intact on exam. Left foot unremarkable. --Patient given dressing to place of her blister when spontaneous rupture occurs with recommendations to avoid premature rupture --Discussed red flags at which point patient would need to seek medical care for  antibiotics --Recommended patient follow-up with primary care doctor at [place]  No orders of the defined types were placed in this encounter.  No orders of the defined types were placed in this encounter.   Harriet Butte, [place], PGY-2 10/29/2016 4:55 PM

## 2016-10-29 ENCOUNTER — Encounter: Payer: Self-pay | Admitting: Family Medicine

## 2016-10-29 ENCOUNTER — Ambulatory Visit (INDEPENDENT_AMBULATORY_CARE_PROVIDER_SITE_OTHER): Payer: Medicare Other | Admitting: Family Medicine

## 2016-10-29 DIAGNOSIS — S90821A Blister (nonthermal), right foot, initial encounter: Secondary | ICD-10-CM

## 2016-10-29 NOTE — ED Provider Notes (Signed)
  [place]   [id number] 10/28/16 Arrival Time: 4709  ASSESSMENT & PLAN:  Today you were diagnosed with the following: 1. Chronic nonintractable headache, unspecified headache type   Possible sinusitis that could be causing his persistent headaches. Also quite possible having some withdrawal symptoms from [facility].  You have been prescribed prescription medications this visit  Please take and complete all medications as prescribed.  If you are not improving over the next few days or feel you are worsening please follow up here or the Emergency Department if you are unable to see your regular doctor.  Meds ordered this encounter  Medications  . ketorolac (TORADOL) injection 60 mg  . amoxicillin-clavulanate (AUGMENTIN) 875-125 MG tablet    Sig: Take 1 tablet by mouth every 12 (twelve) hours.    Dispense:  20 tablet    Refill:  0   Reviewed expectations re: course of current medical issues. Questions answered. Outlined signs and symptoms indicating need for more acute intervention. Patient verbalized understanding. After Visit Summary given.   SUBJECTIVE:  Joshua [name] is a 61 y.o. male who presents with complaint of persistent HA. Seen recently and note reviewed. Toradol with temporary relief. HA is mainly frontal. Dull. Comes and goes. No n/v/visual changes. Occasionally HA extends over scalp toward posterior neck. No neck pain. Ambulatory. Also described persistent nasal congestion.  ROS: As per HPI.   OBJECTIVE:  Vitals:   10/28/16 1752  BP: 128/66  Pulse: 73  Resp: 18  Temp: 98.4 F (36.9 C)  SpO2: 98%     General appearance: alert; no distress Head: normocephalic; atraumatic; some maxillary and frontal tenderness to palpation Eyes: conjunctivae normal; EOMI; PERRLA Nose: mucosa inflamed Throat: lips, mucosa, and tongue normal; teeth and gums normal Lungs: clear to auscultation bilaterally Heart: regular rate and rhythm MSK: symmetrical with no  gross deformities Skin: warm and dry; no rashes or lesions Neurologic: alert and oriented X 3; normal symmetric reflexes; normal gait Psychological:  alert and cooperative; normal mood and affect   Allergies  Allergen Reactions  . Imitrex [Sumatriptan] Other (See Comments)    mania    PMHx, SurgHx, SocialHx, Medications, and Allergies were reviewed in the Visit Navigator and updated as appropriate.      Vanessa Kick, MD 10/29/16 1726

## 2016-10-29 NOTE — Patient Instructions (Addendum)
Thank you for coming in to see [place] today. Please see below to review our plan for today's visit.  1. Do not pop the blister. Allow the blister to rupture on its own. You can apply the adhesive bandage I provided you when it ruptures. Make sure to practice good hygiene and wear dry socks and shoes. It does not appear infected at this time.  2. Your current PCP is [facility]. You can reach them at [phone]. 3. You can call 1-800-quit-now for free resources for smoking cessation.

## 2016-10-29 NOTE — Assessment & Plan Note (Signed)
Acute. Does not appear infected. Afebrile. Blister intact on exam. Left foot unremarkable. --Patient given dressing to place of her blister when spontaneous rupture occurs with recommendations to avoid premature rupture --Discussed red flags at which point patient would need to seek medical care for antibiotics --Recommended patient follow-up with primary care doctor at [place]

## 2016-11-03 ENCOUNTER — Emergency Department (HOSPITAL_COMMUNITY)
Admission: EM | Admit: 2016-11-03 | Discharge: 2016-11-03 | Disposition: A | Discharge status: Home / Self Care | Payer: Medicare Other | Attending: Emergency Medicine | Admitting: Emergency Medicine

## 2016-11-03 DIAGNOSIS — Z79899 Other long term (current) drug therapy: Secondary | ICD-10-CM | POA: Insufficient documentation

## 2016-11-03 DIAGNOSIS — Z7984 Long term (current) use of oral hypoglycemic drugs: Secondary | ICD-10-CM | POA: Diagnosis not present

## 2016-11-03 DIAGNOSIS — F17210 Nicotine dependence, cigarettes, uncomplicated: Secondary | ICD-10-CM | POA: Diagnosis not present

## 2016-11-03 DIAGNOSIS — Z760 Encounter for issue of repeat prescription: Secondary | ICD-10-CM | POA: Insufficient documentation

## 2016-11-03 DIAGNOSIS — M791 Myalgia: Secondary | ICD-10-CM | POA: Diagnosis not present

## 2016-11-03 MED ORDER — TRAMADOL HCL 50 MG PO TABS: 50.0000 mg | ORAL_TABLET | Freq: Four times a day (QID) | ORAL | 0 refills | Status: DC | PRN

## 2016-11-03 MED ORDER — TRAMADOL HCL 50 MG PO TABS
50.0000 mg | ORAL_TABLET | Freq: Once | ORAL | Status: AC
  Administered 2016-11-03: 50 mg via ORAL
  Filled 2016-11-03: qty 1

## 2016-11-03 NOTE — ED Provider Notes (Signed)
[facility] DEPT Provider Note   CSN: [id number] Arrival date & time: 11/03/16  1303  By signing my name below, I, Joshua [name], attest that this documentation has been prepared under the direction and in the presence of [name], [place]. Electronically Signed: Mayer [name], Scribe. 11/03/16. 3:00 PM.  History   Chief Complaint Chief Complaint  Patient presents with  . Medication Refill   The history is provided by the patient. No language interpreter was used.    HPI Comments: Joshua [name] is a 61 y.o. male with PMHx of schizoaffective disorder who presents to the Emergency Department requesting new prescriptions for his mental health. Pt was in behavioral health and was recently discharged with new medications. He has tried the new medications for 1 month and now reports a HA, generalized body aches and states they are making his hair fall out and he states "I ain't going to take them no more". He stopped taking his original medications because "it was making me fat" which resulted in him having a 6-7 month long manic episode. He denies any current manic episodes. He goes to [facility]. Pt states he does not have a PCP.  Past Medical History:  Diagnosis Date  . Back pain   . Bipolar disorder ([place])   . Cannabis use disorder, moderate, dependence ([place])   . Chronic pain   . Generalized anxiety disorder   . Hypertension   . Schizoaffective disorder [facility])     Patient Active Problem List   Diagnosis Date Noted  . Blister of right foot 10/29/2016  . Chronic pain syndrome 10/14/2016  . Osteoarthritis 10/14/2016  . T2DM (type 2 diabetes mellitus) ([place]) 10/14/2016  . HTN (hypertension) 10/14/2016  . Schizoaffective disorder, bipolar type ([place]) 05/10/2016  . Generalized anxiety disorder 05/04/2016    Past Surgical History:  Procedure Laterality Date  . APPENDECTOMY  1970       Home Medications    Prior to Admission medications   Medication Sig Start Date End Date  Taking? Authorizing Provider  amoxicillin-clavulanate (AUGMENTIN) 875-125 MG tablet Take 1 tablet by mouth every 12 (twelve) hours. 10/28/16   Vanessa Kick, MD  butalbital-acetaminophen-caffeine (FIORICET, ESGIC) [phone] MG tablet Take 1-2 tablets by mouth every 6 (six) hours as needed for headache. 10/16/16 10/16/17  Lysbeth Penner, FNP  glipiZIDE (GLUCOTROL) 10 MG tablet Take 1 tablet (10 mg total) by mouth 2 (two) times daily with a meal. For diabetes 10/15/16   Grenola Bing, DO  hydrochlorothiazide (HYDRODIURIL) 25 MG tablet Take 1 tablet (25 mg total) by mouth every morning. For high blood pressure 10/14/16   Bacigalupo, Dionne Bucy, MD  hydrOXYzine (ATARAX/VISTARIL) 50 MG tablet Take 1 tablet (50 mg total) by mouth 3 (three) times daily as needed for anxiety. 10/02/16   Lindell Spar I, NP  lisinopril (PRINIVIL,ZESTRIL) 40 MG tablet Take 1 tablet (40 mg total) by mouth every morning. For high blood pressure 10/03/16   Nwoko, Herbert Pun I, NP  mometasone-formoterol (DULERA) 200-5 MCG/ACT AERO Inhale 2 puffs into the lungs 2 (two) times daily. For shortness of breath Patient not taking: Reported on 10/14/2016 10/02/16   Lindell Spar I, NP  nicotine polacrilex (NICORETTE) 2 MG gum Take 1 each (2 mg total) by mouth as needed for smoking cessation. Patient not taking: Reported on 10/14/2016 10/02/16   Lindell Spar I, NP  OLANZapine zydis (ZYPREXA) 15 MG disintegrating tablet Take 1 tablet (15 mg total) by mouth at bedtime. For mood control 10/02/16   Nwoko,  Loleta Dicker, NP  traZODone (DESYREL) 150 MG tablet Take 150 mg by mouth at bedtime. 10/13/16   [provider]  traZODone (DESYREL) 50 MG tablet Take 1 tablet (50 mg) at bedtime: For sleep 10/02/16   Lindell Spar I, NP  VENTOLIN HFA 108 (90 Base) MCG/ACT inhaler Inhale 2 puffs into the lungs every 4 (four) hours as needed. 10/14/16   Virginia Crews, MD    Family History Family History  Problem Relation Age of Onset  . Diabetes Mother   . Cancer  Mother        unsure what type  . Heart disease Mother   . Hypertension Mother   . Dementia Father   . Diabetes Brother   . Dementia Maternal Grandfather   . Cancer Paternal Grandmother 76       breast  . Mental illness Neg Hx     Social History Social History  Substance Use Topics  . Smoking status: Current Every Day Smoker    Packs/day: 2.00    Types: Cigarettes  . Smokeless tobacco: Never Used  . Alcohol use Yes     Comment: occ     Allergies   Imitrex [sumatriptan]   Review of Systems Review of Systems  Musculoskeletal: Positive for myalgias.  Neurological: Positive for headaches.  Psychiatric/Behavioral: Positive for behavioral problems. The patient is nervous/anxious.   All other systems reviewed and are negative.    Physical Exam Updated Vital Signs BP 109/68 (BP Location: Right Arm)   Pulse 90   Temp 98.2 F (36.8 C) (Oral)   Resp 16   SpO2 99%   Physical Exam  Constitutional: He is oriented to person, place, and time. He appears well-developed and well-nourished.  HENT:  Head: Normocephalic.  Eyes: EOM are normal.  Neck: Normal range of motion.  Pulmonary/Chest: Effort normal.  Abdominal: He exhibits no distension.  Musculoskeletal: Normal range of motion.  Neurological: He is alert and oriented to person, place, and time.  Psychiatric:  Affect: anxious  Nursing note and vitals reviewed.    ED Treatments / Results  DIAGNOSTIC STUDIES: Oxygen Saturation is 99% on RA, normal by my interpretation.    COORDINATION OF CARE: 2:46 PM Discussed treatment plan with pt at bedside and pt agreed to plan.  Labs (all labs ordered are listed, but only abnormal results are displayed) Labs Reviewed - No data to display  EKG  EKG Interpretation None       Radiology No results found.  Procedures Procedures (including critical care time)  Medications Ordered in ED Medications - No data to display   Initial Impression / Assessment and Plan  / ED Course  I have reviewed the triage vital signs and the nursing notes.  Pertinent labs & imaging results that were available during my care of the patient were reviewed by me and considered in my medical decision making (see chart for details).       Final Clinical Impressions(s) / ED Diagnoses   Final diagnoses:  Medication management    New Prescriptions New Prescriptions   TRAMADOL (ULTRAM) 50 MG TABLET    Take 1 tablet (50 mg total) by mouth every 6 (six) hours as needed.   Social work will give primary care information.  Pt given tramadol po  Pt advised he needs [facility] to manage his psych medications. An After Visit Summary was printed and given to the patient. Meds ordered this encounter  Medications  . traMADol (ULTRAM) tablet 50 mg  .  traMADol (ULTRAM) 50 MG tablet    Sig: Take 1 tablet (50 mg total) by mouth every 6 (six) hours as needed.    Dispense:  10 tablet    Refill:  0    Order Specific Question:   Supervising Provider    Answer:   Veryl Speak [[id number]]    I personally performed the services in this documentation, which was scribed in my presence.  The recorded information has been reviewed and considered.   Ronnald Collum.   Fransico Meadow, [place] 11/03/16 1515    Veryl Speak, MD 11/05/16 2358

## 2016-11-03 NOTE — Discharge Instructions (Addendum)
Follow up at [facility] for management of your Psychiatric medications.

## 2016-11-03 NOTE — ED Triage Notes (Signed)
Was in [facility]   And d/c on 6/9 and they switched his meds  , he is here because he tried the n ew  meds  For a month and now he has a h/a and they are not working and wants to be back on the old meds , he has list

## 2016-11-09 ENCOUNTER — Ambulatory Visit (INDEPENDENT_AMBULATORY_CARE_PROVIDER_SITE_OTHER): Payer: Medicare Other | Admitting: Physician Assistant

## 2016-11-09 ENCOUNTER — Encounter (INDEPENDENT_AMBULATORY_CARE_PROVIDER_SITE_OTHER): Payer: Self-pay | Admitting: Physician Assistant

## 2016-11-09 VITALS — BP 126/82 | HR 96 | Temp 98.0°F | Ht 69.0 in | Wt 203.8 lb

## 2016-11-09 DIAGNOSIS — F250 Schizoaffective disorder, bipolar type: Secondary | ICD-10-CM

## 2016-11-09 MED ORDER — DIVALPROEX SODIUM 500 MG PO DR TAB: 500.0000 mg | DELAYED_RELEASE_TABLET | Freq: Three times a day (TID) | ORAL | 2 refills | Status: DC

## 2016-11-09 NOTE — Progress Notes (Signed)
Subjective:  Patient ID: Joshua [name], male    DOB: [birth year]  Age: 61 y.o. MRN: [id number]  CC: hospital f/u  HPI Joshua [name] is a 61 y.o. male with a PMH of Schizoaffective disorder, GAD, bipolar disorder, HTN, and cannabis use presents as a new patient requesting need of refill for Depakote. Patient stopped taking Depakote due to weight gain but mania was controlled. He stopped taking Depakote on his own for one month which eventually led him to mania. His psychiatrist reportedly changed him to Zyprexa and hydroxzyine which he says is ineffective and gives him a headache. Realized he was talking to the [employer]. Stopped taking Zyprexa and Hydroxyzine last week. Took his left over Depakote 500mg  TID. Says he does not currently have a headache and is feeling much better. He also requests a refill of Tramadol that was prescribed to him in the ED for his headache. Does not endorse any other symptoms or complaints.     Outpatient Medications Prior to Visit  Medication Sig Dispense Refill  . glipiZIDE (GLUCOTROL) 10 MG tablet Take 1 tablet (10 mg total) by mouth 2 (two) times daily with a meal. For diabetes 60 tablet 3  . hydrochlorothiazide (HYDRODIURIL) 25 MG tablet Take 1 tablet (25 mg total) by mouth every morning. For high blood pressure 30 tablet 0  . lisinopril (PRINIVIL,ZESTRIL) 40 MG tablet Take 1 tablet (40 mg total) by mouth every morning. For high blood pressure 10 tablet 0  . traMADol (ULTRAM) 50 MG tablet Take 1 tablet (50 mg total) by mouth every 6 (six) hours as needed. 10 tablet 0  . traZODone (DESYREL) 150 MG tablet Take 150 mg by mouth at bedtime.  0  . VENTOLIN HFA 108 (90 Base) MCG/ACT inhaler Inhale 2 puffs into the lungs every 4 (four) hours as needed. 1 Inhaler 0  . amoxicillin-clavulanate (AUGMENTIN) 875-125 MG tablet Take 1 tablet by mouth every 12 (twelve) hours. (Patient not taking: Reported on 11/09/2016) 20 tablet 0  . butalbital-acetaminophen-caffeine  (FIORICET, ESGIC) 50-325-40 MG tablet Take 1-2 tablets by mouth every 6 (six) hours as needed for headache. (Patient not taking: Reported on 11/09/2016) 20 tablet 0  . hydrOXYzine (ATARAX/VISTARIL) 50 MG tablet Take 1 tablet (50 mg total) by mouth 3 (three) times daily as needed for anxiety. (Patient not taking: Reported on 11/09/2016) 60 tablet 0  . mometasone-formoterol (DULERA) 200-5 MCG/ACT AERO Inhale 2 puffs into the lungs 2 (two) times daily. For shortness of breath (Patient not taking: Reported on 10/14/2016)    . nicotine polacrilex (NICORETTE) 2 MG gum Take 1 each (2 mg total) by mouth as needed for smoking cessation. (Patient not taking: Reported on 10/14/2016) 100 tablet 0  . OLANZapine zydis (ZYPREXA) 15 MG disintegrating tablet Take 1 tablet (15 mg total) by mouth at bedtime. For mood control (Patient not taking: Reported on 11/09/2016) 30 tablet 0  . traZODone (DESYREL) 50 MG tablet Take 1 tablet (50 mg) at bedtime: For sleep (Patient not taking: Reported on 11/09/2016) 30 tablet 0   No facility-administered medications prior to visit.      ROS Review of Systems  Constitutional: Negative for chills, fever and malaise/fatigue.  Eyes: Negative for blurred vision.  Respiratory: Negative for shortness of breath.   Cardiovascular: Negative for chest pain and palpitations.  Gastrointestinal: Negative for abdominal pain and nausea.  Genitourinary: Negative for dysuria and hematuria.  Musculoskeletal: Negative for joint pain and myalgias.  Skin: Negative for rash.  Neurological: Negative for  tingling and headaches.  Psychiatric/Behavioral: Negative for depression. The patient is not nervous/anxious.     Objective:  BP 126/82 (BP Location: Left Arm, Patient Position: Sitting, Cuff Size: Normal)   Pulse 96   Temp 98 F (36.7 C) (Oral)   Ht 5\' 9"  (1.753 m)   Wt 203 lb 12.8 oz (92.4 kg)   SpO2 94%   BMI 30.10 kg/m   BP/Weight 11/09/2016 6/96/2952 11/28/1322  Systolic BP 401 027 253   Diastolic BP 82 68 78  Wt. (Lbs) 203.8 - 203.2  BMI 30.1 - 30.9  Some encounter information is confidential and restricted. Go to Review Flowsheets activity to see all data.      Physical Exam  Constitutional: He is oriented to person, place, and time.  Well developed, overweight, NAD, talkative  HENT:  Head: Normocephalic and atraumatic.  Eyes: Conjunctivae are normal. No scleral icterus.  Neck: Normal range of motion. Neck supple. No thyromegaly present.  Cardiovascular: Normal rate, regular rhythm and normal heart sounds.   Pulmonary/Chest: Effort normal and breath sounds normal.  Musculoskeletal: He exhibits no edema.  Neurological: He is alert and oriented to person, place, and time.  Skin: Skin is warm and dry. No rash noted. No erythema. No pallor.  Psychiatric: He has a normal mood and affect. His behavior is normal. Thought content normal.  Very talkative, would keep speaking if not interrupted.  Vitals reviewed.    Assessment & Plan:   1. Schizoaffective disorder, bipolar type ([place]) - Patient has stopped Zyprexa and Hydroxyzine on his own. I have stated he should be taking direction from his psychiatrist in regards to his treatments. - Begin divalproex (DEPAKOTE) 500 MG DR tablet; Take 1 tablet (500 mg total) by mouth 3 (three) times daily.  Dispense: 30 tablet; Refill: 2 - Comprehensive metabolic panel - CBC with Differential  *Pt very upset because I would not prescribe Tramadol for his headache. I told him of our policy of not prescribing narcotics on the first visit but he accused me of not wanting to take care of him. He also did not want to get his labs drawn because he knows he is "fine". I strongly advised that he go to his psychiatrist to receive further treatment of his multiple psychiatric disorders.   Meds ordered this encounter  Medications  . divalproex (DEPAKOTE) 500 MG DR tablet    Sig: Take 1 tablet (500 mg total) by mouth 3 (three) times daily.     Dispense:  30 tablet    Refill:  2    Order Specific Question:   Supervising Provider    Answer:   Tresa Garter [id number]    Follow-up: Return in about 4 weeks (around 12/07/2016) for headache.   Clent Demark PA

## 2016-11-09 NOTE — Patient Instructions (Addendum)
You must return to [facility] services to discuss your treatment regimen.    Schizoaffective Disorder Schizoaffective disorder (ScAD) is a mental illness. It causes symptoms that are a mixture of a psychotic disorder (schizophrenia) and a mood (affective) disorder. A psychotic disorder involves losing touch with reality. ScAD usually occurs in cycles. Periods of severe symptoms may be followed by periods of less severe symptoms or improvement. The illness affects men and women equally, but it usually appears at an earlier age (teenage or early adult years) in men. People who have family members with schizophrenia, bipolar disorder, or ScAD are at higher risk of developing ScAD. ScAD may interfere with personal relationships or normal daily activities. People with ScAD are at a higher risk for:  Job loss.  Social aloneness (isolation).  Health problems.  Anxiety.  Substance use disorders.  Suicide.  What are the causes? The exact cause of this condition is not known. What increases the risk? The following factors may make you more likely to develop this condition:  Problems during your mother's pregnancy and after your birth, such as: ? Your mother having the flu (influenza) during the second semester of the pregnancy. ? Exposure to drugs, alcohol, illnesses, or other poisons (toxins) before birth. ? Low birth weight.  A brain infection or viral infection.  Problems with brain structure or function.  Having family members with bipolar disorder, ScAD, or schizophrenia.  Substance abuse.  Having been diagnosed with a mental health condition in the past.  Being a victim of neglect or long-term (chronic) abuse.  What are the signs or symptoms? At any one time, people with ScAD may have psychotic symptoms only or have both psychotic and affective symptoms. Psychotic symptoms may include:  Hearing, seeing, or feeling things that are not there  (hallucinations).  Having fixed, false beliefs (delusions). The delusions usually include being attacked, harassed, or plotted against (paranoid delusions).  Speaking in a way that makes no sense to others (disorganized speech).  Confusing or odd behavior.  Loss of motivation for normal daily activities, such as self-care.  Withdrawal from social contacts (social isolation).  Lack of emotions.  Affective symptoms may include:  Symptoms similar to major depression, such as: ? Depressed mood. ? Loss of interest in activities that are usually pleasurable (anhedonia). ? Sleeping more or less than normal. ? Feeling worthless or excessively guilty. ? Lack of energy or motivation. ? Trouble concentrating. ? Eating more or less than usual. ? Thinking a lot about death or suicide.  Symptoms similar to bipolar mania. Bipolar mania refers to periods of severe elation, irritability, and high energy that are experienced by people who have bipolar disorder. These symptoms may include: ? Abnormally elevated or irritable mood. ? Abnormally increased energy or activity. ? More confidence than normal or feeling that you are able to do anything (grandiosity). ? Feeling rested after getting less sleep than normal. ? Being easily distracted. ? Talking more than usual or feeling pressure to keep talking. ? Feeling that your thoughts are racing. ? Engaging in high-risk activities.  How is this diagnosed? ScAD is diagnosed through an assessment by your health care provider. Your health care provider may refer you to a mental health specialist for evaluation. The mental health specialist:  Will observe and ask questions about your thoughts, behavior, mood, and ability to function in daily life.  May ask questions about your medical history and use of drugs, including prescription medicines. Certain medical conditions and substances can cause symptoms  that resemble ScAD.  May do blood tests and  imaging tests.  There are two types of ScAD:  Depressive ScAD. This type is diagnosed when you have only depressive symptoms.  Bipolar ScAD. This type is diagnosed if your affective symptoms are only manic or are a mixture of manic and depressive.  How is this treated? ScAD is usually a lifelong (chronic) illness that requires long-term treatment. Treatment may include:  Medicine. Different types of medicine are used to treat ScAD. The exact combination depends on the type and severity of your symptoms. ? Antipsychotic medicine may be used to control psychotic symptoms such as delusions, paranoia, and hallucinations. ? Mood stabilizers may be used to balance the highs and lows of bipolar manic mood swings. ? Antidepressant medicines may be used to treat depressive symptoms.  Counseling or talk therapy. Individual, group, or family counseling may be helpful in providing education, support, and guidance. Many people with ScAD also benefit from social skills and job skills (vocational) training.  A combination of medicine and counseling is usually best for managing the disorder over time. A procedure in which electricity is applied to the brain through the scalp (electroconvulsive therapy) may be used to treat people with severe manic symptoms who do not respond to medicine and counseling. Follow these instructions at home:  Take over-the-counter and prescription medicines only as told by your health care provider. Check with your health care provider before starting new medicines.  Surround yourself with people who care about you and can help you manage your condition.  Keep stress under control. Stress may make symptoms worse.  Avoid alcohol and drugs. They can affect how medicine works and make symptoms worse.  Keep all follow-up visits as told by your health care provider and counselor. This is important. Contact a health care provider if:  You are not able to take your medicines as  prescribed.  Your symptoms get worse. Get help right away if:  You feel out of control.  You or others notice warning signs of suicide, such as: ? Increased use of drugs or alcohol ? Expressing feelings of not having a purpose in life or feeling trapped, guilty, anxious, agitated, or hopeless. ? Withdrawing from friends and family. ? Showing uncontrolled anger, recklessness, and dramatic mood changes. ? Talking about suicide or searching for methods. If you ever feel like you may hurt yourself or others, or have thoughts about taking your own life, get help right away. You can go to your nearest emergency department or call:  Your local emergency services (911 in the [place]  A suicide crisis helpline, such as the [place] at 910-648-7425. This is open 24 hours a day.  Summary  Schizoaffective disorder causes symptoms that are a mixture of a psychotic disorder and a mood disorder.  A combination of medicine and counseling is usually best for managing the disorder over time.  People who have schizoaffective disorder are at risk for suicide. Get help right away if you or someone else notices warning signs of suicide. This information is not intended to replace advice given to you by your health care provider. Make sure you discuss any questions you have with your health care provider. Document Released: 08/23/2006 Document Revised: 01/23/2016 Document Reviewed: 01/23/2016 Elsevier Interactive Patient Education  [employer].

## 2016-11-11 DIAGNOSIS — G43909 Migraine, unspecified, not intractable, without status migrainosus: Secondary | ICD-10-CM | POA: Diagnosis not present

## 2016-11-11 DIAGNOSIS — E119 Type 2 diabetes mellitus without complications: Secondary | ICD-10-CM | POA: Diagnosis not present

## 2016-11-11 DIAGNOSIS — F310 Bipolar disorder, current episode hypomanic: Secondary | ICD-10-CM | POA: Diagnosis not present

## 2016-11-11 DIAGNOSIS — I10 Essential (primary) hypertension: Secondary | ICD-10-CM | POA: Diagnosis not present

## 2016-11-18 DIAGNOSIS — R339 Retention of urine, unspecified: Secondary | ICD-10-CM | POA: Diagnosis not present

## 2016-11-18 DIAGNOSIS — R5383 Other fatigue: Secondary | ICD-10-CM | POA: Diagnosis not present

## 2016-11-18 DIAGNOSIS — G43909 Migraine, unspecified, not intractable, without status migrainosus: Secondary | ICD-10-CM | POA: Diagnosis not present

## 2016-11-18 DIAGNOSIS — I10 Essential (primary) hypertension: Secondary | ICD-10-CM | POA: Diagnosis not present

## 2016-11-18 DIAGNOSIS — E119 Type 2 diabetes mellitus without complications: Secondary | ICD-10-CM | POA: Diagnosis not present

## 2016-11-18 DIAGNOSIS — R351 Nocturia: Secondary | ICD-10-CM | POA: Diagnosis not present

## 2016-11-18 DIAGNOSIS — D649 Anemia, unspecified: Secondary | ICD-10-CM | POA: Diagnosis not present

## 2016-11-19 DIAGNOSIS — E119 Type 2 diabetes mellitus without complications: Secondary | ICD-10-CM | POA: Diagnosis not present

## 2016-11-19 DIAGNOSIS — I10 Essential (primary) hypertension: Secondary | ICD-10-CM | POA: Diagnosis not present

## 2016-11-19 DIAGNOSIS — G43909 Migraine, unspecified, not intractable, without status migrainosus: Secondary | ICD-10-CM | POA: Diagnosis not present

## 2016-11-23 ENCOUNTER — Inpatient Hospital Stay (INDEPENDENT_AMBULATORY_CARE_PROVIDER_SITE_OTHER): Payer: Self-pay | Admitting: Physician Assistant

## 2016-12-02 DIAGNOSIS — I10 Essential (primary) hypertension: Secondary | ICD-10-CM | POA: Diagnosis not present

## 2016-12-02 DIAGNOSIS — R5383 Other fatigue: Secondary | ICD-10-CM | POA: Diagnosis not present

## 2016-12-02 DIAGNOSIS — N182 Chronic kidney disease, stage 2 (mild): Secondary | ICD-10-CM | POA: Diagnosis not present

## 2016-12-02 DIAGNOSIS — G43909 Migraine, unspecified, not intractable, without status migrainosus: Secondary | ICD-10-CM | POA: Diagnosis not present

## 2016-12-02 DIAGNOSIS — E119 Type 2 diabetes mellitus without complications: Secondary | ICD-10-CM | POA: Diagnosis not present

## 2016-12-06 DIAGNOSIS — I10 Essential (primary) hypertension: Secondary | ICD-10-CM | POA: Diagnosis not present

## 2016-12-06 DIAGNOSIS — G43909 Migraine, unspecified, not intractable, without status migrainosus: Secondary | ICD-10-CM | POA: Diagnosis not present

## 2016-12-06 DIAGNOSIS — E119 Type 2 diabetes mellitus without complications: Secondary | ICD-10-CM | POA: Diagnosis not present

## 2016-12-09 ENCOUNTER — Ambulatory Visit (HOSPITAL_COMMUNITY)
Admission: EM | Admit: 2016-12-09 | Discharge: 2016-12-09 | Disposition: A | Discharge status: Home / Self Care | Payer: Medicare Other | Attending: Family Medicine | Admitting: Family Medicine

## 2016-12-09 ENCOUNTER — Encounter (HOSPITAL_COMMUNITY): Payer: Self-pay | Admitting: Nurse Practitioner

## 2016-12-09 DIAGNOSIS — G43809 Other migraine, not intractable, without status migrainosus: Secondary | ICD-10-CM | POA: Diagnosis not present

## 2016-12-09 MED ORDER — DEXAMETHASONE SODIUM PHOSPHATE 10 MG/ML IJ SOLN
10.0000 mg | Freq: Once | INTRAMUSCULAR | Status: AC
  Administered 2016-12-09: 10 mg via INTRAMUSCULAR

## 2016-12-09 MED ORDER — METOCLOPRAMIDE HCL 5 MG/ML IJ SOLN
5.0000 mg | Freq: Once | INTRAMUSCULAR | Status: AC
  Administered 2016-12-09: 5 mg via INTRAMUSCULAR

## 2016-12-09 MED ORDER — BUTALBITAL-APAP-CAFFEINE 50-325-40 MG PO TABS: 1.0000 | ORAL_TABLET | Freq: Four times a day (QID) | ORAL | 0 refills | Status: DC | PRN

## 2016-12-09 MED ORDER — METOCLOPRAMIDE HCL 5 MG/ML IJ SOLN
INTRAMUSCULAR | Status: AC
  Filled 2016-12-09: qty 2

## 2016-12-09 MED ORDER — DEXAMETHASONE SODIUM PHOSPHATE 10 MG/ML IJ SOLN
INTRAMUSCULAR | Status: AC
  Filled 2016-12-09: qty 1

## 2016-12-09 MED ORDER — KETOROLAC TROMETHAMINE 60 MG/2ML IM SOLN
60.0000 mg | Freq: Once | INTRAMUSCULAR | Status: AC
  Administered 2016-12-09: 60 mg via INTRAMUSCULAR

## 2016-12-09 MED ORDER — KETOROLAC TROMETHAMINE 60 MG/2ML IM SOLN
INTRAMUSCULAR | Status: AC
  Filled 2016-12-09: qty 2

## 2016-12-09 NOTE — ED Triage Notes (Signed)
Pt presents with c/o headache. The headache is a throbbing pain in his face and the back of his head. hes been having headaches since May and has an upcoming appointment at the [facility] in October but the pain is severe today. He tried tramadol, tylenol sinus at home this morning with no relief.

## 2016-12-09 NOTE — ED Provider Notes (Signed)
  Joshua [name]   [id number] 12/09/16 Arrival Time: 5277  ASSESSMENT & PLAN:  1. Other migraine without status migrainosus, not intractable     Meds ordered this encounter  Medications  . ketorolac (TORADOL) injection 60 mg  . metoCLOPramide (REGLAN) injection 5 mg  . dexamethasone (DECADRON) injection 10 mg  . butalbital-acetaminophen-caffeine (FIORICET, ESGIC) 50-325-40 MG tablet    Sig: Take 1 tablet by mouth every 6 (six) hours as needed for headache.    Dispense:  20 tablet    Refill:  0   Will keep appt with headache specialist. May f/u here as needed.  Reviewed expectations re: course of current medical issues. Questions answered. Outlined signs and symptoms indicating need for more acute intervention. Patient verbalized understanding. After Visit Summary given.   SUBJECTIVE:  Joshua [name] is a 62 y.o. male who presents with complaint of recurring headaches. No change in symptoms from previous visits. Current headache present for a few days. Slightly worse today. Mild nausea without emesis. Frontal and occipital mainly. Ambulatory without problems. No extremity weakness or sensation changes. OTC in addition to tramadol without relief. [facility]  ROS: As per HPI.   OBJECTIVE:  Vitals:   12/09/16 1320  BP: 137/74  Pulse: 66  Resp: 17  Temp: 98.3 F (36.8 C)  TempSrc: Oral  SpO2: 95%     General appearance: alert; no distress HEENT: normocephalic; atraumatic; conjunctivae normal; TMs normal; nasal mucosa normal; oral mucosa normal Neck: supple Lungs: clear to auscultation bilaterally Heart: regular rate and rhythm Extremities: no cyanosis or edema; symmetrical with no gross deformities Skin: warm and dry Neurologic: normal symmetric reflexes; normal gait Psychological:  alert and cooperative; normal mood and affect   Allergies  Allergen Reactions  . Imitrex [Sumatriptan] Other (See Comments)   mania    PMHx, SurgHx, SocialHx, Medications, and Allergies were reviewed in the Visit Navigator and updated as appropriate.      Vanessa Kick, MD 12/09/16 [phone]

## 2016-12-19 ENCOUNTER — Encounter (HOSPITAL_COMMUNITY): Payer: Self-pay | Admitting: Emergency Medicine

## 2016-12-19 ENCOUNTER — Ambulatory Visit (HOSPITAL_COMMUNITY)
Admission: EM | Admit: 2016-12-19 | Discharge: 2016-12-19 | Disposition: A | Discharge status: Home / Self Care | Payer: Medicare Other | Attending: Family Medicine | Admitting: Family Medicine

## 2016-12-19 DIAGNOSIS — G43009 Migraine without aura, not intractable, without status migrainosus: Secondary | ICD-10-CM | POA: Diagnosis not present

## 2016-12-19 DIAGNOSIS — J029 Acute pharyngitis, unspecified: Secondary | ICD-10-CM | POA: Diagnosis not present

## 2016-12-19 DIAGNOSIS — J00 Acute nasopharyngitis [common cold]: Secondary | ICD-10-CM

## 2016-12-19 MED ORDER — DEXAMETHASONE SODIUM PHOSPHATE 10 MG/ML IJ SOLN
INTRAMUSCULAR | Status: AC
  Filled 2016-12-19: qty 1

## 2016-12-19 MED ORDER — METOCLOPRAMIDE HCL 5 MG/ML IJ SOLN
5.0000 mg | Freq: Once | INTRAMUSCULAR | Status: AC
  Administered 2016-12-19: 5 mg via INTRAMUSCULAR

## 2016-12-19 MED ORDER — KETOROLAC TROMETHAMINE 60 MG/2ML IM SOLN
INTRAMUSCULAR | Status: AC
  Filled 2016-12-19: qty 2

## 2016-12-19 MED ORDER — METOCLOPRAMIDE HCL 5 MG/ML IJ SOLN
INTRAMUSCULAR | Status: AC
  Filled 2016-12-19: qty 2

## 2016-12-19 MED ORDER — METHYLPREDNISOLONE 4 MG PO TBPK: ORAL_TABLET | ORAL | 0 refills | Status: DC

## 2016-12-19 MED ORDER — AZITHROMYCIN 250 MG PO TABS: 250.0000 mg | ORAL_TABLET | Freq: Every day | ORAL | 0 refills | Status: DC

## 2016-12-19 MED ORDER — KETOROLAC TROMETHAMINE 60 MG/2ML IM SOLN
60.0000 mg | Freq: Once | INTRAMUSCULAR | Status: AC
  Administered 2016-12-19: 60 mg via INTRAMUSCULAR

## 2016-12-19 MED ORDER — DEXAMETHASONE SODIUM PHOSPHATE 10 MG/ML IJ SOLN
10.0000 mg | Freq: Once | INTRAMUSCULAR | Status: AC
  Administered 2016-12-19: 10 mg via INTRAMUSCULAR

## 2016-12-19 NOTE — ED Provider Notes (Signed)
  [place]   [id number] 12/19/16 Arrival Time: 32  ASSESSMENT & PLAN:  1. Migraine without aura and without status migrainosus, not intractable   2. Acute nasopharyngitis     Meds ordered this encounter  Medications  . ketorolac (TORADOL) injection 60 mg  . metoCLOPramide (REGLAN) injection 5 mg  . dexamethasone (DECADRON) injection 10 mg  . azithromycin (ZITHROMAX) 250 MG tablet    Sig: Take 1 tablet (250 mg total) by mouth daily. Take first 2 tablets together, then 1 every day until finished.    Dispense:  6 tablet    Refill:  0    Order Specific Question:   Supervising Provider    Answer:   Sherlene Shams [[id number]]  . methylPREDNISolone (MEDROL DOSEPAK) 4 MG TBPK tablet    Sig: Take 6-5-4-3-2-1 po qd    Dispense:  21 tablet    Refill:  0    Order Specific Question:   Supervising Provider    Answer:   Sherlene Shams [[id number]]    Reviewed expectations re: course of current medical issues. Questions answered. Outlined signs and symptoms indicating need for more acute intervention. Patient verbalized understanding. After Visit Summary given.   SUBJECTIVE:  Joshua [name] is a 61 y.o. male who presents with complaint of URI sx's and migraine headache.  He is awaiting an appointment with a headache clinic.  ROS: As per HPI.   OBJECTIVE:  Vitals:   12/19/16 1726  BP: 132/79  Pulse: 63  Resp: 18  Temp: 98.1 F (36.7 C)  TempSrc: Oral  SpO2: 98%     General appearance: alert; no distress Eyes: PERRLA; EOMI; conjunctiva normal HENT: normocephalic; atraumatic; TMs normal; nasal mucosa normal; oral mucosa normal Neck: supple Lungs: clear to auscultation bilaterally Heart: regular rate and rhythm Abdomen: soft, non-tender; bowel sounds normal; no masses or organomegaly; no guarding or rebound tenderness Back: no CVA tenderness Extremities: no cyanosis or edema; symmetrical with no gross deformities Skin: warm and dry Neurologic: normal gait;  normal symmetric reflexes Psychological: alert and cooperative; normal mood and affect  Past Medical History:  Diagnosis Date  . Back pain   . Bipolar disorder ([place])   . Cannabis use disorder, moderate, dependence ([place])   . Chronic pain   . Generalized anxiety disorder   . Hypertension   . Schizoaffective disorder [facility])      has a past medical history of Back pain; Bipolar disorder ([place]); Cannabis use disorder, moderate, dependence ([place]); Chronic pain; Generalized anxiety disorder; Hypertension; and Schizoaffective disorder ([place]).    Labs Reviewed - No data to display  Imaging: No results found.  Allergies  Allergen Reactions  . Imitrex [Sumatriptan] Other (See Comments)    mania    Family History  Problem Relation Age of Onset  . Diabetes Mother   . Cancer Mother        unsure what type  . Heart disease Mother   . Hypertension Mother   . Dementia Father   . Diabetes Brother   . Dementia Maternal Grandfather   . Cancer Paternal Grandmother 6       breast  . Mental illness Neg Hx    Past Surgical History:  Procedure Laterality Date  . [place], [place] 12/19/16 1827

## 2016-12-19 NOTE — ED Triage Notes (Signed)
The patient presented to the [facility] with a complaint of left ear pain and a headache x 2 days. The patient also complained of nasal congestion.

## 2016-12-24 ENCOUNTER — Ambulatory Visit (INDEPENDENT_AMBULATORY_CARE_PROVIDER_SITE_OTHER): Payer: Medicare Other

## 2016-12-24 ENCOUNTER — Ambulatory Visit (HOSPITAL_COMMUNITY)
Admission: EM | Admit: 2016-12-24 | Discharge: 2016-12-24 | Disposition: A | Discharge status: Home / Self Care | Payer: Medicare Other | Attending: Family | Admitting: Family

## 2016-12-24 ENCOUNTER — Encounter (HOSPITAL_COMMUNITY): Payer: Self-pay | Admitting: *Deleted

## 2016-12-24 DIAGNOSIS — R0789 Other chest pain: Secondary | ICD-10-CM

## 2016-12-24 DIAGNOSIS — J0100 Acute maxillary sinusitis, unspecified: Secondary | ICD-10-CM

## 2016-12-24 DIAGNOSIS — J209 Acute bronchitis, unspecified: Secondary | ICD-10-CM

## 2016-12-24 DIAGNOSIS — R079 Chest pain, unspecified: Secondary | ICD-10-CM | POA: Diagnosis not present

## 2016-12-24 MED ORDER — PREDNISONE 10 MG PO TABS
ORAL_TABLET | ORAL | 0 refills | Status: DC
Start: 2016-12-24 — End: 2017-05-28

## 2016-12-24 MED ORDER — AMOXICILLIN 500 MG PO CAPS: 1000.0000 mg | ORAL_CAPSULE | Freq: Two times a day (BID) | ORAL | 0 refills | Status: AC

## 2016-12-24 NOTE — ED Provider Notes (Signed)
[place]    CSN: [id number] Arrival date & time: 12/24/16  1212     History   Chief Complaint Chief Complaint  Patient presents with  . Chest Pain  . Shortness of Breath    HPI Joshua [name] is a 61 y.o. male.   61 year old male presents with left sided chest pain when he breathes for the past 2 days. He has had sinus congestion, pressure in his ears and cough with congestion for about 6 days. He also had a headache 4 days ago in which he was seen here at Urgent Care- was given a Toradol injection, Reglan as well as Zithromax and Decadron taper. He finished the Zithromax today and has 1 more dose of steroid left. His headache is much better. Has appointment with the Headache clinic in 4 days. He denies any fever, sore throat or Gi symptoms. He has a history of multiple chronic conditions including chronic back pain, bipolar disorder, anxiety, and HTN, and is on multiple medications for conditions. He also smokes over 2 packs per day and has used Albuterol the past 2 days with minimal relief.    The history is provided by the patient.    Past Medical History:  Diagnosis Date  . Back pain   . Bipolar disorder ([place])   . Cannabis use disorder, moderate, dependence ([place])   . Chronic pain   . Generalized anxiety disorder   . Hypertension   . Schizoaffective disorder [facility])     Patient Active Problem List   Diagnosis Date Noted  . Blister of right foot 10/29/2016  . Chronic pain syndrome 10/14/2016  . Osteoarthritis 10/14/2016  . T2DM (type 2 diabetes mellitus) ([place]) 10/14/2016  . HTN (hypertension) 10/14/2016  . Schizoaffective disorder, bipolar type ([place]) 05/10/2016  . Generalized anxiety disorder 05/04/2016    Past Surgical History:  Procedure Laterality Date  . APPENDECTOMY  1970       Home Medications    Prior to Admission medications   Medication Sig Start Date End Date Taking? Authorizing Provider  amoxicillin (AMOXIL) 500 MG capsule Take 2  capsules (1,000 mg total) by mouth 2 (two) times daily. 12/24/16 12/31/16  Katy Apo, NP  butalbital-acetaminophen-caffeine (FIORICET, ESGIC) [phone] MG tablet Take 1 tablet by mouth every 6 (six) hours as needed for headache. 12/09/16   Vanessa Kick, MD  divalproex (DEPAKOTE) 500 MG DR tablet Take 1 tablet (500 mg total) by mouth 3 (three) times daily. 11/09/16   Clent Demark, PA-C  glipiZIDE (GLUCOTROL) 10 MG tablet Take 1 tablet (10 mg total) by mouth 2 (two) times daily with a meal. For diabetes 10/15/16    Bing, DO  hydrochlorothiazide (HYDRODIURIL) 25 MG tablet Take 1 tablet (25 mg total) by mouth every morning. For high blood pressure 10/14/16   Bacigalupo, Dionne Bucy, MD  lisinopril (PRINIVIL,ZESTRIL) 40 MG tablet Take 1 tablet (40 mg total) by mouth every morning. For high blood pressure 10/03/16   Lindell Spar I, NP  methylPREDNISolone (MEDROL DOSEPAK) 4 MG TBPK tablet Take 6-5-4-3-2-1 po qd 12/19/16   Lysbeth Penner, FNP  predniSONE (DELTASONE) 10 MG tablet Take 2 tablets by mouth today and tomorrow. Then 1 tablet by mouth day 3 and 4 then stop. 12/24/16   Katy Apo, NP  risperiDONE (RISPERDAL) 2 MG tablet Take 2 mg by mouth at bedtime.    [provider]  traZODone (DESYREL) 150 MG tablet Take 150 mg by mouth at bedtime.  10/13/16   [provider]  VENTOLIN HFA 108 (90 Base) MCG/ACT inhaler Inhale 2 puffs into the lungs every 4 (four) hours as needed. 10/14/16   Virginia Crews, MD    Family History Family History  Problem Relation Age of Onset  . Diabetes Mother   . Cancer Mother        unsure what type  . Heart disease Mother   . Hypertension Mother   . Dementia Father   . Diabetes Brother   . Dementia Maternal Grandfather   . Cancer Paternal Grandmother 56       breast  . Mental illness Neg Hx     Social History Social History  Substance Use Topics  . Smoking status: Current Every Day Smoker    Packs/day: 2.00    Types:  Cigarettes  . Smokeless tobacco: Never Used  . Alcohol use Yes     Comment: occ     Allergies   Imitrex [sumatriptan]   Review of Systems Review of Systems  Constitutional: Positive for fatigue. Negative for appetite change, chills and fever.  HENT: Positive for congestion, ear pain, postnasal drip, rhinorrhea, sinus pain and sinus pressure. Negative for facial swelling, sneezing and sore throat.   Respiratory: Positive for cough, chest tightness, shortness of breath and wheezing.   Cardiovascular: Positive for chest pain. Negative for palpitations.  Gastrointestinal: Negative for abdominal pain, diarrhea, nausea and vomiting.  Genitourinary: Negative for decreased urine volume, difficulty urinating and flank pain.  Musculoskeletal: Positive for back pain. Negative for neck pain and neck stiffness.  Skin: Negative for rash and wound.  Neurological: Positive for headaches. Negative for dizziness, seizures, syncope, speech difficulty, weakness, light-headedness and numbness.  Hematological: Negative for adenopathy. Does not bruise/bleed easily.     Physical Exam Triage Vital Signs ED Triage Vitals  Enc Vitals Group     BP 12/24/16 1231 (!) 137/94     Pulse Rate 12/24/16 1231 82     Resp 12/24/16 1231 18     Temp 12/24/16 1231 98.1 F (36.7 C)     Temp Source 12/24/16 1231 Oral     SpO2 12/24/16 1231 99 %     Weight --      Height --      Head Circumference --      Peak Flow --      Pain Score 12/24/16 1230 2     Pain Loc --      Pain Edu? --      Excl. in [place]? --    No data found.   Updated Vital Signs BP (!) 137/94 (BP Location: Right Arm)   Pulse 82   Temp 98.1 F (36.7 C) (Oral)   Resp 18   SpO2 99%   Visual Acuity Right Eye Distance:   Left Eye Distance:   Bilateral Distance:    Right Eye Near:   Left Eye Near:    Bilateral Near:     Physical Exam  Constitutional: He is oriented to person, place, and time. He appears well-developed and  well-nourished. No distress.  Patient is anxious and pacing around the exam room.   HENT:  Head: Normocephalic and atraumatic.  Right Ear: Hearing, external ear and ear canal normal. Tympanic membrane is bulging.  Left Ear: Hearing, external ear and ear canal normal. Tympanic membrane is bulging.  Nose: Mucosal edema and rhinorrhea present. Right sinus exhibits maxillary sinus tenderness and frontal sinus tenderness. Left sinus exhibits maxillary sinus tenderness and frontal  sinus tenderness.  Mouth/Throat: Uvula is midline and mucous membranes are normal. Posterior oropharyngeal erythema present.  Neck: Normal range of motion. Neck supple.  Cardiovascular: Normal rate, regular rhythm and normal heart sounds.   No murmur heard. Pulmonary/Chest: No accessory muscle usage. He is in respiratory distress. He has decreased breath sounds in the right upper field, the right lower field, the left upper field and the left lower field. He has wheezes in the left upper field and the left lower field. He has rhonchi in the right upper field, the right lower field, the left upper field and the left lower field.  Musculoskeletal: Normal range of motion.  Lymphadenopathy:    He has no cervical adenopathy.  Neurological: He is alert and oriented to person, place, and time.  Skin: Skin is warm and dry. Capillary refill takes less than 2 seconds. No rash noted.  Psychiatric: His speech is normal and behavior is normal. Thought content normal. His mood appears anxious.     UC Treatments / Results  Labs (all labs ordered are listed, but only abnormal results are displayed) Labs Reviewed - No data to display  EKG  EKG Interpretation None       Radiology Dg Chest 2 View  Result Date: 12/24/2016 CLINICAL DATA:  Chest pain for 2 days. Headache. Difficulty breathing. EXAM: CHEST  2 VIEW COMPARISON:  05/09/2016 FINDINGS: The heart size and mediastinal contours are within normal limits. Both lungs are  clear. The visualized skeletal structures are unremarkable. IMPRESSION: No active cardiopulmonary disease. Electronically Signed   By: Kathreen Devoid   On: 12/24/2016 13:39    Procedures Procedures (including critical care time)  Medications Ordered in UC Medications - No data to display   Initial Impression / Assessment and Plan / UC Course  I have reviewed the triage vital signs and the nursing notes.  Pertinent labs & imaging results that were available during my care of the patient were reviewed by me and considered in my medical decision making (see chart for details).    Reviewed negative chest x-ray results with patient. Reviewed that he may have sinusitis and bronchitis- does not appear to have pneumonia. Uncertain if chest pain is due to muscle strain from cough or lifting heavy objects 2 days ago at work. Recommend Amoxicillin 1g twice a day as directed (unable to take Augmentin due to GI distress but can take Amoxicillin). Continue Prednisone 10mg - take 2 tablets today and tomorrow and then 1 tablet on day 3 and 4. Strongly encouraged to decrease smoking. Discussed that low probability of cardiac issue causing chest pain but if pain persists or worsen, go to ER ASAP.  Recommend follow-up with your PCP and with your headache doctor in 4 days as planned or go to ER if pain worsens.   Final Clinical Impressions(s) / UC Diagnoses   Final diagnoses:  Acute maxillary sinusitis, recurrence not specified  Acute bronchitis, unspecified organism  Chest wall pain    New Prescriptions Discharge Medication List as of 12/24/2016  2:22 PM    START taking these medications   Details  amoxicillin (AMOXIL) 500 MG capsule Take 2 capsules (1,000 mg total) by mouth 2 (two) times daily., Starting Fri 12/24/2016, Until Fri 12/31/2016, Normal    predniSONE (DELTASONE) 10 MG tablet Take 2 tablets by mouth today and tomorrow. Then 1 tablet by mouth day 3 and 4 then stop., Normal         Controlled  Substance Prescriptions [place] Controlled Substance Registry  consulted? Yes, I have consulted the [place] Controlled Substances Registry for this patient- Active Rx is Tramadol- prescribed by PCP. No additional controlled substances considered or prescribed at this visit.    Katy Apo, NP 12/24/16 2337

## 2016-12-24 NOTE — Discharge Instructions (Addendum)
Recommend start Amoxicillin 2 capsules twice a day as directed for 7 days. Continue Prednisone 10mg - take 2 tablets today and tomorrow and then 1 tablet on day 3 and 4. Strongly encouraged to decrease smoking. Recommend follow-up with your PCP and with your headache doctor in 4 days as planned or go to ER if pain worsens.

## 2016-12-24 NOTE — ED Triage Notes (Signed)
Pt  Short   Of  Breath    Pains  In  Chest   When  He  Breathes   Gets   Short  Of  Breath   On  Exertion    Seen       4  Days  Ago    And   Was  Given   z  Pack     Pt  Is   A   Smoker

## 2016-12-28 DIAGNOSIS — G43901 Migraine, unspecified, not intractable, with status migrainosus: Secondary | ICD-10-CM | POA: Diagnosis not present

## 2016-12-28 DIAGNOSIS — R51 Headache: Secondary | ICD-10-CM | POA: Diagnosis not present

## 2016-12-28 DIAGNOSIS — Z049 Encounter for examination and observation for unspecified reason: Secondary | ICD-10-CM | POA: Diagnosis not present

## 2016-12-28 DIAGNOSIS — Z79899 Other long term (current) drug therapy: Secondary | ICD-10-CM | POA: Diagnosis not present

## 2016-12-28 DIAGNOSIS — G43019 Migraine without aura, intractable, without status migrainosus: Secondary | ICD-10-CM | POA: Diagnosis not present

## 2016-12-31 DIAGNOSIS — F250 Schizoaffective disorder, bipolar type: Secondary | ICD-10-CM | POA: Diagnosis not present

## 2016-12-31 DIAGNOSIS — F312 Bipolar disorder, current episode manic severe with psychotic features: Secondary | ICD-10-CM | POA: Diagnosis not present

## 2017-01-05 DIAGNOSIS — C44622 Squamous cell carcinoma of skin of right upper limb, including shoulder: Secondary | ICD-10-CM | POA: Diagnosis not present

## 2017-01-05 DIAGNOSIS — C44529 Squamous cell carcinoma of skin of other part of trunk: Secondary | ICD-10-CM | POA: Diagnosis not present

## 2017-01-05 DIAGNOSIS — D045 Carcinoma in situ of skin of trunk: Secondary | ICD-10-CM | POA: Diagnosis not present

## 2017-01-05 DIAGNOSIS — D3611 Benign neoplasm of peripheral nerves and autonomic nervous system of face, head, and neck: Secondary | ICD-10-CM | POA: Diagnosis not present

## 2017-01-05 DIAGNOSIS — B078 Other viral warts: Secondary | ICD-10-CM | POA: Diagnosis not present

## 2017-01-05 DIAGNOSIS — L570 Actinic keratosis: Secondary | ICD-10-CM | POA: Diagnosis not present

## 2017-01-05 DIAGNOSIS — D3617 Benign neoplasm of peripheral nerves and autonomic nervous system of trunk, unspecified: Secondary | ICD-10-CM | POA: Diagnosis not present

## 2017-01-05 DIAGNOSIS — D216 Benign neoplasm of connective and other soft tissue of trunk, unspecified: Secondary | ICD-10-CM | POA: Diagnosis not present

## 2017-01-05 DIAGNOSIS — Z85828 Personal history of other malignant neoplasm of skin: Secondary | ICD-10-CM | POA: Diagnosis not present

## 2017-01-06 DIAGNOSIS — R5383 Other fatigue: Secondary | ICD-10-CM | POA: Diagnosis not present

## 2017-01-06 DIAGNOSIS — E139 Other specified diabetes mellitus without complications: Secondary | ICD-10-CM | POA: Diagnosis not present

## 2017-01-06 DIAGNOSIS — E785 Hyperlipidemia, unspecified: Secondary | ICD-10-CM | POA: Diagnosis not present

## 2017-01-06 DIAGNOSIS — J4520 Mild intermittent asthma, uncomplicated: Secondary | ICD-10-CM | POA: Diagnosis not present

## 2017-01-06 DIAGNOSIS — M5116 Intervertebral disc disorders with radiculopathy, lumbar region: Secondary | ICD-10-CM | POA: Diagnosis not present

## 2017-01-06 DIAGNOSIS — J309 Allergic rhinitis, unspecified: Secondary | ICD-10-CM | POA: Diagnosis not present

## 2017-01-06 DIAGNOSIS — E559 Vitamin D deficiency, unspecified: Secondary | ICD-10-CM | POA: Diagnosis not present

## 2017-01-06 DIAGNOSIS — R972 Elevated prostate specific antigen [PSA]: Secondary | ICD-10-CM | POA: Diagnosis not present

## 2017-01-20 DIAGNOSIS — I10 Essential (primary) hypertension: Secondary | ICD-10-CM | POA: Diagnosis not present

## 2017-01-20 DIAGNOSIS — E119 Type 2 diabetes mellitus without complications: Secondary | ICD-10-CM | POA: Diagnosis not present

## 2017-01-21 ENCOUNTER — Encounter (HOSPITAL_COMMUNITY): Payer: Self-pay | Admitting: Family Medicine

## 2017-01-21 ENCOUNTER — Ambulatory Visit (HOSPITAL_COMMUNITY)
Admission: EM | Admit: 2017-01-21 | Discharge: 2017-01-21 | Disposition: A | Discharge status: Home / Self Care | Payer: Medicare Other | Attending: Internal Medicine | Admitting: Internal Medicine

## 2017-01-21 DIAGNOSIS — J0141 Acute recurrent pansinusitis: Secondary | ICD-10-CM

## 2017-01-21 DIAGNOSIS — R0981 Nasal congestion: Secondary | ICD-10-CM

## 2017-01-21 MED ORDER — DOXYCYCLINE HYCLATE 100 MG PO CAPS: 100.0000 mg | ORAL_CAPSULE | Freq: Two times a day (BID) | ORAL | 0 refills | Status: DC

## 2017-01-21 MED ORDER — DEXAMETHASONE SODIUM PHOSPHATE 10 MG/ML IJ SOLN
INTRAMUSCULAR | Status: AC
  Filled 2017-01-21: qty 1

## 2017-01-21 MED ORDER — KETOROLAC TROMETHAMINE 60 MG/2ML IM SOLN
INTRAMUSCULAR | Status: AC
  Filled 2017-01-21: qty 2

## 2017-01-21 MED ORDER — METOCLOPRAMIDE HCL 5 MG/ML IJ SOLN
INTRAMUSCULAR | Status: AC
  Filled 2017-01-21: qty 2

## 2017-01-21 MED ORDER — METOCLOPRAMIDE HCL 5 MG/ML IJ SOLN
5.0000 mg | Freq: Once | INTRAMUSCULAR | Status: AC
  Administered 2017-01-21: 5 mg via INTRAMUSCULAR

## 2017-01-21 MED ORDER — FLUTICASONE PROPIONATE 50 MCG/ACT NA SUSP: 2.0000 | Freq: Every day | NASAL | 0 refills | Status: DC

## 2017-01-21 MED ORDER — DEXAMETHASONE SODIUM PHOSPHATE 10 MG/ML IJ SOLN
10.0000 mg | Freq: Once | INTRAMUSCULAR | Status: AC
  Administered 2017-01-21: 10 mg via INTRAMUSCULAR

## 2017-01-21 MED ORDER — CETIRIZINE-PSEUDOEPHEDRINE ER 5-120 MG PO TB12: 1.0000 | ORAL_TABLET | Freq: Every day | ORAL | 0 refills | Status: DC

## 2017-01-21 MED ORDER — KETOROLAC TROMETHAMINE 60 MG/2ML IM SOLN
60.0000 mg | Freq: Once | INTRAMUSCULAR | Status: AC
  Administered 2017-01-21: 60 mg via INTRAMUSCULAR

## 2017-01-21 NOTE — ED Provider Notes (Signed)
[place]    CSN: [id number] Arrival date & time: 01/21/17  1647     History   Chief Complaint Chief Complaint  Patient presents with  . Nasal Congestion    HPI Joshua [name] is a 61 y.o. male.   61 year old male with history of bipolar, chronic pain, generalized anxiety disorder, schizoaffective disorder comes in for continued sinus pain after finishing amoxicillin and prednisone. Occasional cough, congestion, ear pressure. Has taken some otc sinus medication without relief. Denies fever, chills, night sweats. Denies chest pain, shortness of breath, wheezing, palpitations. Patient with seasonal allergies but without any antihistamines. Bilateral throbbing headache around temporal region, admits to photophobia, phonophobia. Denies nausea, vomiting, weakness, dizziness. Patient states diabetes well controlled, last a1c 6.3. Patient states he has had sinus issues for many years, PCP has discussed about ENT referral.      Past Medical History:  Diagnosis Date  . Back pain   . Bipolar disorder ([place])   . Cannabis use disorder, moderate, dependence ([place])   . Chronic pain   . Generalized anxiety disorder   . Hypertension   . Schizoaffective disorder [facility])     Patient Active Problem List   Diagnosis Date Noted  . Blister of right foot 10/29/2016  . Chronic pain syndrome 10/14/2016  . Osteoarthritis 10/14/2016  . T2DM (type 2 diabetes mellitus) ([place]) 10/14/2016  . HTN (hypertension) 10/14/2016  . Schizoaffective disorder, bipolar type ([place]) 05/10/2016  . Generalized anxiety disorder 05/04/2016    Past Surgical History:  Procedure Laterality Date  . APPENDECTOMY  1970       Home Medications    Prior to Admission medications   Medication Sig Start Date End Date Taking? Authorizing Provider  butalbital-acetaminophen-caffeine (FIORICET, ESGIC) [phone] MG tablet Take 1 tablet by mouth every 6 (six) hours as needed for headache. 12/09/16   Vanessa Kick,  MD  cetirizine-pseudoephedrine (ZYRTEC-D) 5-120 MG tablet Take 1 tablet by mouth daily. 01/21/17   Tasia Catchings, Kemo Spruce V, PA-C  divalproex (DEPAKOTE) 500 MG DR tablet Take 1 tablet (500 mg total) by mouth 3 (three) times daily. 11/09/16   Clent Demark, PA-C  doxycycline (VIBRAMYCIN) 100 MG capsule Take 1 capsule (100 mg total) by mouth 2 (two) times daily. 01/21/17   Tasia Catchings, Evoleth Nordmeyer V, PA-C  fluticasone (FLONASE) 50 MCG/ACT nasal spray Place 2 sprays into both nostrils daily. 01/21/17   Tasia Catchings, Kemo Spruce V, PA-C  glipiZIDE (GLUCOTROL) 10 MG tablet Take 1 tablet (10 mg total) by mouth 2 (two) times daily with a meal. For diabetes 10/15/16   Ali Chukson Bing, DO  hydrochlorothiazide (HYDRODIURIL) 25 MG tablet Take 1 tablet (25 mg total) by mouth every morning. For high blood pressure 10/14/16   Bacigalupo, Dionne Bucy, MD  lisinopril (PRINIVIL,ZESTRIL) 40 MG tablet Take 1 tablet (40 mg total) by mouth every morning. For high blood pressure 10/03/16   Lindell Spar I, NP  methylPREDNISolone (MEDROL DOSEPAK) 4 MG TBPK tablet Take 6-5-4-3-2-1 po qd 12/19/16   Lysbeth Penner, FNP  predniSONE (DELTASONE) 10 MG tablet Take 2 tablets by mouth today and tomorrow. Then 1 tablet by mouth day 3 and 4 then stop. 12/24/16   Katy Apo, NP  risperiDONE (RISPERDAL) 2 MG tablet Take 2 mg by mouth at bedtime.    [provider]  traZODone (DESYREL) 150 MG tablet Take 150 mg by mouth at bedtime. 10/13/16   [provider]  VENTOLIN HFA 108 (90 Base) MCG/ACT inhaler Inhale 2 puffs into  the lungs every 4 (four) hours as needed. 10/14/16   Virginia Crews, MD    Family History Family History  Problem Relation Age of Onset  . Diabetes Mother   . Cancer Mother        unsure what type  . Heart disease Mother   . Hypertension Mother   . Dementia Father   . Diabetes Brother   . Dementia Maternal Grandfather   . Cancer Paternal Grandmother 21       breast  . Mental illness Neg Hx     Social History Social History    Substance Use Topics  . Smoking status: Current Every Day Smoker    Packs/day: 2.00    Types: Cigarettes  . Smokeless tobacco: Never Used  . Alcohol use Yes     Comment: occ     Allergies   Imitrex [sumatriptan]   Review of Systems Review of Systems  Reason unable to perform ROS: See HPI as above.     Physical Exam Triage Vital Signs ED Triage Vitals  Enc Vitals Group     BP 01/21/17 1753 (!) 153/90     Pulse Rate 01/21/17 1753 74     Resp 01/21/17 1753 18     Temp 01/21/17 1753 98.6 F (37 C)     Temp src --      SpO2 01/21/17 1753 100 %     Weight --      Height --      Head Circumference --      Peak Flow --      Pain Score 01/21/17 1752 8     Pain Loc --      Pain Edu? --      Excl. in [place]? --    No data found.   Updated Vital Signs BP (!) 153/90   Pulse 74   Temp 98.6 F (37 C)   Resp 18   SpO2 100%   Physical Exam  Constitutional: He is oriented to person, place, and time. He appears well-developed and well-nourished. No distress.  HENT:  Head: Normocephalic and atraumatic.  Right Ear: External ear and ear canal normal. Tympanic membrane is erythematous. Tympanic membrane is not bulging.  Left Ear: External ear and ear canal normal. Tympanic membrane is erythematous. Tympanic membrane is not bulging.  Nose: Right sinus exhibits maxillary sinus tenderness and frontal sinus tenderness. Left sinus exhibits maxillary sinus tenderness and frontal sinus tenderness.  Mouth/Throat: Uvula is midline, oropharynx is clear and moist and mucous membranes are normal.  Eyes: Pupils are equal, round, and reactive to light. Conjunctivae are normal.  Neck: Normal range of motion. Neck supple.  Cardiovascular: Normal rate, regular rhythm and normal heart sounds.  Exam reveals no gallop and no friction rub.   No murmur heard. Pulmonary/Chest: Effort normal and breath sounds normal. He has no decreased breath sounds. He has no wheezes. He has no rhonchi. He has no  rales.  Lymphadenopathy:    He has no cervical adenopathy.  Neurological: He is alert and oriented to person, place, and time. He has normal strength. He is not disoriented. No cranial nerve deficit or sensory deficit.  Skin: Skin is warm and dry.  Psychiatric: He has a normal mood and affect. His behavior is normal. Judgment normal.     UC Treatments / Results  Labs (all labs ordered are listed, but only abnormal results are displayed) Labs Reviewed - No data to display  EKG  EKG Interpretation None  Radiology No results found.  Procedures Procedures (including critical care time)  Medications Ordered in UC Medications  ketorolac (TORADOL) injection 60 mg (60 mg Intramuscular Given 01/21/17 1924)  metoCLOPramide (REGLAN) injection 5 mg (5 mg Intramuscular Given 01/21/17 1927)  dexamethasone (DECADRON) injection 10 mg (10 mg Intramuscular Given 01/21/17 1925)     Initial Impression / Assessment and Plan / UC Course  I have reviewed the triage vital signs and the nursing notes.  Pertinent labs & imaging results that were available during my care of the patient were reviewed by me and considered in my medical decision making (see chart for details).     Start doxycycline as directed. Symptomatic treatment with flonase, zyrtec D. Toradol, Decadron, Reglan injection in office today for headache. Push fluids. Discussed with patient given recurrent sinusitis, should follow-up with ENT for further evaluation. Patient states PCP has discussed ENT referral with him in the past, will contact PCP for referral.  Discussed case with Dr Marcille Blanco, who agrees to plan.   Final Clinical Impressions(s) / UC Diagnoses   Final diagnoses:  Acute recurrent pansinusitis    New Prescriptions Discharge Medication List as of 01/21/2017  7:18 PM    START taking these medications   Details  cetirizine-pseudoephedrine (ZYRTEC-D) 5-120 MG tablet Take 1 tablet by mouth daily., Starting Fri  01/21/2017, Normal    doxycycline (VIBRAMYCIN) 100 MG capsule Take 1 capsule (100 mg total) by mouth 2 (two) times daily., Starting Fri 01/21/2017, Normal    fluticasone (FLONASE) 50 MCG/ACT nasal spray Place 2 sprays into both nostrils daily., Starting Fri 01/21/2017, Normal          Ok Edwards, PA-C 01/21/17 1934

## 2017-01-21 NOTE — ED Triage Notes (Signed)
Pt here for sinus congestion. sts he was treated with abx and his symptoms resolved bu then returned. sts causing migraines.

## 2017-01-21 NOTE — Discharge Instructions (Signed)
Start doxycycline as directed. Start flonase, zyrtec-D for nasal congestion. You can use over the counter nasal saline rinse such as neti pot for nasal congestion. Keep hydrated, your urine should be clear to pale yellow in color. Tylenol/motrin for fever and pain. Follow-up with PCP for ENT referral. Monitor for any worsening of symptoms, chest pain, shortness of breath, wheezing, swelling of the throat, follow up for reevaluation.

## 2017-02-02 DIAGNOSIS — M5116 Intervertebral disc disorders with radiculopathy, lumbar region: Secondary | ICD-10-CM | POA: Diagnosis not present

## 2017-02-02 DIAGNOSIS — I10 Essential (primary) hypertension: Secondary | ICD-10-CM | POA: Diagnosis not present

## 2017-02-02 DIAGNOSIS — E119 Type 2 diabetes mellitus without complications: Secondary | ICD-10-CM | POA: Diagnosis not present

## 2017-02-12 ENCOUNTER — Encounter (HOSPITAL_COMMUNITY): Payer: Self-pay | Admitting: Emergency Medicine

## 2017-02-12 ENCOUNTER — Ambulatory Visit (HOSPITAL_COMMUNITY)
Admission: EM | Admit: 2017-02-12 | Discharge: 2017-02-12 | Disposition: A | Discharge status: Home / Self Care | Payer: Medicare Other | Attending: Family Medicine | Admitting: Family Medicine

## 2017-02-12 DIAGNOSIS — R51 Headache: Secondary | ICD-10-CM

## 2017-02-12 DIAGNOSIS — R110 Nausea: Secondary | ICD-10-CM | POA: Diagnosis not present

## 2017-02-12 DIAGNOSIS — R519 Headache, unspecified: Secondary | ICD-10-CM

## 2017-02-12 MED ORDER — ONDANSETRON HCL 4 MG/2ML IJ SOLN
INTRAMUSCULAR | Status: AC
Start: 2017-02-12 — End: ?
  Filled 2017-02-12: qty 2

## 2017-02-12 MED ORDER — DIPHENHYDRAMINE HCL 25 MG PO CAPS
50.0000 mg | ORAL_CAPSULE | Freq: Once | ORAL | Status: AC
  Administered 2017-02-12: 50 mg via ORAL

## 2017-02-12 MED ORDER — KETOROLAC TROMETHAMINE 30 MG/ML IJ SOLN
INTRAMUSCULAR | Status: AC
  Filled 2017-02-12: qty 1

## 2017-02-12 MED ORDER — ONDANSETRON HCL 4 MG/2ML IJ SOLN
4.0000 mg | Freq: Once | INTRAMUSCULAR | Status: AC
  Administered 2017-02-12: 4 mg via INTRAMUSCULAR

## 2017-02-12 MED ORDER — DEXAMETHASONE SODIUM PHOSPHATE 10 MG/ML IJ SOLN
INTRAMUSCULAR | Status: AC
  Filled 2017-02-12: qty 1

## 2017-02-12 MED ORDER — KETOROLAC TROMETHAMINE 30 MG/ML IJ SOLN
30.0000 mg | Freq: Once | INTRAMUSCULAR | Status: AC
  Administered 2017-02-12: 30 mg via INTRAMUSCULAR

## 2017-02-12 MED ORDER — DEXAMETHASONE SODIUM PHOSPHATE 10 MG/ML IJ SOLN
10.0000 mg | Freq: Once | INTRAMUSCULAR | Status: AC
  Administered 2017-02-12: 10 mg via INTRAMUSCULAR

## 2017-02-12 MED ORDER — DIPHENHYDRAMINE HCL 25 MG PO CAPS
ORAL_CAPSULE | ORAL | Status: AC
  Filled 2017-02-12: qty 2

## 2017-02-12 NOTE — ED Provider Notes (Signed)
. [place]    CSN: [id number] Arrival date & time: 02/12/17  [place]     History   Chief Complaint Chief Complaint  Patient presents with  . Headache    HPI Joshua [name] is a 61 y.o. male.   60 y.o. male presents with headache. Patient reports light sensitivity and nausea. Denies visual disturbances, vomiting, dizziness, weakness. Describes the pain as intermittent and worsening in intensity.  Patient has a history of headaches.  X. Condition is acute on chronic in nature. Condition is made better by nothing. Condition is made worse by light. Patient denies any treatment prior to there arrival at this facility.        Past Medical History:  Diagnosis Date  . Back pain   . Bipolar disorder ([place])   . Cannabis use disorder, moderate, dependence ([place])   . Chronic pain   . Generalized anxiety disorder   . Hypertension   . Schizoaffective disorder [facility])     Patient Active Problem List   Diagnosis Date Noted  . Blister of right foot 10/29/2016  . Chronic pain syndrome 10/14/2016  . Osteoarthritis 10/14/2016  . T2DM (type 2 diabetes mellitus) ([place]) 10/14/2016  . HTN (hypertension) 10/14/2016  . Schizoaffective disorder, bipolar type ([place]) 05/10/2016  . Generalized anxiety disorder 05/04/2016    Past Surgical History:  Procedure Laterality Date  . APPENDECTOMY  1970       Home Medications    Prior to Admission medications   Medication Sig Start Date End Date Taking? Authorizing Provider  butalbital-acetaminophen-caffeine (FIORICET, ESGIC) [phone] MG tablet Take 1 tablet by mouth every 6 (six) hours as needed for headache. 12/09/16  Yes Hagler, Aaron Edelman, MD  glipiZIDE (GLUCOTROL) 10 MG tablet Take 1 tablet (10 mg total) by mouth 2 (two) times daily with a meal. For diabetes 10/15/16  Yes [place] Bing, DO  hydrochlorothiazide (HYDRODIURIL) 25 MG tablet Take 1 tablet (25 mg total) by mouth every morning. For high blood pressure 10/14/16  Yes  Bacigalupo, Dionne Bucy, MD  lisinopril (PRINIVIL,ZESTRIL) 40 MG tablet Take 1 tablet (40 mg total) by mouth every morning. For high blood pressure 10/03/16  Yes Nwoko, Agnes I, NP  risperiDONE (RISPERDAL) 2 MG tablet Take 2 mg by mouth at bedtime.   Yes [provider]  cetirizine-pseudoephedrine (ZYRTEC-D) 5-120 MG tablet Take 1 tablet by mouth daily. 01/21/17   Tasia Catchings, Amy V, PA-C  divalproex (DEPAKOTE) 500 MG DR tablet Take 1 tablet (500 mg total) by mouth 3 (three) times daily. 11/09/16   Clent Demark, PA-C  doxycycline (VIBRAMYCIN) 100 MG capsule Take 1 capsule (100 mg total) by mouth 2 (two) times daily. 01/21/17   Tasia Catchings, Amy V, PA-C  fluticasone (FLONASE) 50 MCG/ACT nasal spray Place 2 sprays into both nostrils daily. 01/21/17   Ok Edwards, PA-C  methylPREDNISolone (MEDROL DOSEPAK) 4 MG TBPK tablet Take 6-5-4-3-2-1 po qd 12/19/16   Lysbeth Penner, FNP  predniSONE (DELTASONE) 10 MG tablet Take 2 tablets by mouth today and tomorrow. Then 1 tablet by mouth day 3 and 4 then stop. 12/24/16   Katy Apo, NP  traZODone (DESYREL) 150 MG tablet Take 150 mg by mouth at bedtime. 10/13/16   [provider]  VENTOLIN HFA 108 (90 Base) MCG/ACT inhaler Inhale 2 puffs into the lungs every 4 (four) hours as needed. 10/14/16   Virginia Crews, MD    Family History Family History  Problem Relation Age of Onset  . Diabetes  Mother   . Cancer Mother        unsure what type  . Heart disease Mother   . Hypertension Mother   . Dementia Father   . Diabetes Brother   . Dementia Maternal Grandfather   . Cancer Paternal Grandmother 6       breast  . Mental illness Neg Hx     Social History Social History  Substance Use Topics  . Smoking status: Current Every Day Smoker    Packs/day: 2.00    Types: Cigarettes  . Smokeless tobacco: Never Used  . Alcohol use Yes     Comment: occ     Allergies   Imitrex [sumatriptan]   Review of Systems Review of Systems  Constitutional:  Negative for chills and fever.  HENT: Negative for ear pain and sore throat.   Eyes: Negative for pain and visual disturbance.  Respiratory: Negative for cough and shortness of breath.   Cardiovascular: Negative for chest pain and palpitations.  Gastrointestinal: Negative for abdominal pain and vomiting.  Genitourinary: Negative for dysuria and hematuria.  Musculoskeletal: Negative for arthralgias and back pain.  Skin: Negative for color change and rash.  Neurological: Positive for headaches. Negative for seizures and syncope.  All other systems reviewed and are negative.    Physical Exam Triage Vital Signs ED Triage Vitals  Enc Vitals Group     BP 02/12/17 1929 (!) 169/97     Pulse Rate 02/12/17 1929 87     Resp 02/12/17 1929 18     Temp 02/12/17 1929 98.9 F (37.2 C)     Temp Source 02/12/17 1929 Oral     SpO2 02/12/17 1929 100 %     Weight --      Height --      Head Circumference --      Peak Flow --      Pain Score 02/12/17 1931 5     Pain Loc --      Pain Edu? --      Excl. in [place]? --    No data found.   Updated Vital Signs BP (!) 169/97 (BP Location: Right Arm)   Pulse 87   Temp 98.9 F (37.2 C) (Oral)   Resp 18   SpO2 100%   Visual Acuity Right Eye Distance:   Left Eye Distance:   Bilateral Distance:    Right Eye Near:   Left Eye Near:    Bilateral Near:     Physical Exam  Constitutional: He appears well-developed and well-nourished.  HENT:  Head: Normocephalic and atraumatic.  Eyes: Conjunctivae are normal.  Neck: Neck supple.  Cardiovascular: Normal rate and regular rhythm.   No murmur heard. Pulmonary/Chest: Effort normal and breath sounds normal. No respiratory distress.  Abdominal: Soft. There is no tenderness.  Musculoskeletal: He exhibits no edema.  Neurological: He is alert.  Skin: Skin is warm and dry.  Psychiatric: He has a normal mood and affect.  Nursing note and vitals reviewed.    UC Treatments / Results  Labs (all labs  ordered are listed, but only abnormal results are displayed) Labs Reviewed - No data to display  EKG  EKG Interpretation None       Radiology No results found.  Procedures Procedures (including critical care time)  Medications Ordered in UC Medications  diphenhydrAMINE (BENADRYL) capsule 50 mg (not administered)  ondansetron (ZOFRAN) injection 4 mg (4 mg Intramuscular Given 02/12/17 1957)  dexamethasone (DECADRON) injection 10 mg (10 mg Intramuscular Given  02/12/17 1958)  ketorolac (TORADOL) 30 MG/ML injection 30 mg (30 mg Intramuscular Given 02/12/17 1958)     Initial Impression / Assessment and Plan / UC Course  I have reviewed the triage vital signs and the nursing notes.  Pertinent labs & imaging results that were available during my care of the patient were reviewed by me and considered in my medical decision making (see chart for details).       Final Clinical Impressions(s) / UC Diagnoses   Final diagnoses:  Headache disorder    New Prescriptions New Prescriptions   No medications on file     Controlled Substance Prescriptions [place] Controlled Substance Registry consulted? Not Applicable   Jacqualine Mau, NP 02/12/17 2008

## 2017-02-12 NOTE — ED Triage Notes (Signed)
Pt here for intermittent HAs onset 3 days  Denies n/v  A&O x4... NAD... Ambulatory

## 2017-02-12 NOTE — Discharge Instructions (Signed)
Take benadryl at home when you do not have to drive

## 2017-02-14 DIAGNOSIS — Z23 Encounter for immunization: Secondary | ICD-10-CM | POA: Diagnosis not present

## 2017-02-15 DIAGNOSIS — M5116 Intervertebral disc disorders with radiculopathy, lumbar region: Secondary | ICD-10-CM | POA: Diagnosis not present

## 2017-02-15 DIAGNOSIS — I10 Essential (primary) hypertension: Secondary | ICD-10-CM | POA: Diagnosis not present

## 2017-02-15 DIAGNOSIS — F259 Schizoaffective disorder, unspecified: Secondary | ICD-10-CM | POA: Diagnosis not present

## 2017-02-15 DIAGNOSIS — E119 Type 2 diabetes mellitus without complications: Secondary | ICD-10-CM | POA: Diagnosis not present

## 2017-02-16 DIAGNOSIS — E119 Type 2 diabetes mellitus without complications: Secondary | ICD-10-CM | POA: Diagnosis not present

## 2017-02-16 DIAGNOSIS — G43909 Migraine, unspecified, not intractable, without status migrainosus: Secondary | ICD-10-CM | POA: Diagnosis not present

## 2017-02-16 DIAGNOSIS — I10 Essential (primary) hypertension: Secondary | ICD-10-CM | POA: Diagnosis not present

## 2017-02-23 DIAGNOSIS — R000 Tachycardia, unspecified: Secondary | ICD-10-CM | POA: Diagnosis not present

## 2017-02-23 DIAGNOSIS — F259 Schizoaffective disorder, unspecified: Secondary | ICD-10-CM | POA: Diagnosis not present

## 2017-02-23 DIAGNOSIS — I10 Essential (primary) hypertension: Secondary | ICD-10-CM | POA: Diagnosis not present

## 2017-02-28 DIAGNOSIS — F259 Schizoaffective disorder, unspecified: Secondary | ICD-10-CM | POA: Diagnosis not present

## 2017-02-28 DIAGNOSIS — I10 Essential (primary) hypertension: Secondary | ICD-10-CM | POA: Diagnosis not present

## 2017-02-28 DIAGNOSIS — G43909 Migraine, unspecified, not intractable, without status migrainosus: Secondary | ICD-10-CM | POA: Diagnosis not present

## 2017-02-28 DIAGNOSIS — E119 Type 2 diabetes mellitus without complications: Secondary | ICD-10-CM | POA: Diagnosis not present

## 2017-03-01 DIAGNOSIS — F259 Schizoaffective disorder, unspecified: Secondary | ICD-10-CM | POA: Diagnosis not present

## 2017-03-07 DIAGNOSIS — D649 Anemia, unspecified: Secondary | ICD-10-CM | POA: Diagnosis not present

## 2017-03-07 DIAGNOSIS — R5383 Other fatigue: Secondary | ICD-10-CM | POA: Diagnosis not present

## 2017-03-07 DIAGNOSIS — R351 Nocturia: Secondary | ICD-10-CM | POA: Diagnosis not present

## 2017-03-07 DIAGNOSIS — E875 Hyperkalemia: Secondary | ICD-10-CM | POA: Diagnosis not present

## 2017-03-15 DIAGNOSIS — E119 Type 2 diabetes mellitus without complications: Secondary | ICD-10-CM | POA: Diagnosis not present

## 2017-03-15 DIAGNOSIS — I10 Essential (primary) hypertension: Secondary | ICD-10-CM | POA: Diagnosis not present

## 2017-03-15 DIAGNOSIS — G43909 Migraine, unspecified, not intractable, without status migrainosus: Secondary | ICD-10-CM | POA: Diagnosis not present

## 2017-03-15 DIAGNOSIS — F319 Bipolar disorder, unspecified: Secondary | ICD-10-CM | POA: Diagnosis not present

## 2017-04-04 ENCOUNTER — Encounter (HOSPITAL_COMMUNITY): Payer: Self-pay | Admitting: Emergency Medicine

## 2017-04-04 ENCOUNTER — Ambulatory Visit (HOSPITAL_COMMUNITY)
Admission: EM | Admit: 2017-04-04 | Discharge: 2017-04-04 | Disposition: A | Discharge status: Home / Self Care | Payer: Medicare Other | Attending: Family Medicine | Admitting: Family Medicine

## 2017-04-04 DIAGNOSIS — G43009 Migraine without aura, not intractable, without status migrainosus: Secondary | ICD-10-CM

## 2017-04-04 MED ORDER — KETOROLAC TROMETHAMINE 60 MG/2ML IM SOLN
60.0000 mg | Freq: Once | INTRAMUSCULAR | Status: AC
  Administered 2017-04-04: 60 mg via INTRAMUSCULAR

## 2017-04-04 MED ORDER — METOCLOPRAMIDE HCL 5 MG/ML IJ SOLN
5.0000 mg | Freq: Once | INTRAMUSCULAR | Status: AC
  Administered 2017-04-04: 5 mg via INTRAMUSCULAR

## 2017-04-04 MED ORDER — METOCLOPRAMIDE HCL 5 MG/ML IJ SOLN
INTRAMUSCULAR | Status: AC
  Filled 2017-04-04: qty 2

## 2017-04-04 MED ORDER — METHYLPREDNISOLONE ACETATE 80 MG/ML IJ SUSP
40.0000 mg | Freq: Once | INTRAMUSCULAR | Status: AC
  Administered 2017-04-04: 40 mg via INTRAMUSCULAR

## 2017-04-04 MED ORDER — METHYLPREDNISOLONE ACETATE 40 MG/ML IJ SUSP
INTRAMUSCULAR | Status: AC
  Filled 2017-04-04: qty 1

## 2017-04-04 MED ORDER — KETOROLAC TROMETHAMINE 60 MG/2ML IM SOLN
INTRAMUSCULAR | Status: AC
  Filled 2017-04-04: qty 2

## 2017-04-04 MED ORDER — TOPIRAMATE 50 MG PO TABS: 50.0000 mg | ORAL_TABLET | Freq: Two times a day (BID) | ORAL | 1 refills | Status: DC

## 2017-04-04 NOTE — ED Provider Notes (Signed)
[place]   [id number] 04/04/17 Arrival Time: [place]   SUBJECTIVE:  Joshua [name] is a 61 y.o. male who presents to the urgent care with complaint of chronic headaches, worse since Labor Day.  Taking more and more Tramadol.  Having trouble with sleep.   Past Medical History:  Diagnosis Date  . Back pain   . Bipolar disorder ([place])   . Cannabis use disorder, moderate, dependence ([place])   . Chronic pain   . Generalized anxiety disorder   . Hypertension   . Schizoaffective disorder ([place])    Family History  Problem Relation Age of Onset  . Diabetes Mother   . Cancer Mother        unsure what type  . Heart disease Mother   . Hypertension Mother   . Dementia Father   . Diabetes Brother   . Dementia Maternal Grandfather   . Cancer Paternal Grandmother 61       breast  . Mental illness Neg Hx    Social History   Socioeconomic History  . Marital status: Single    Spouse name: Not on file  . Number of children: Not on file  . Years of education: Not on file  . Highest education level: Not on file  Social Needs  . Financial resource strain: Not on file  . Food insecurity - worry: Not on file  . Food insecurity - inability: Not on file  . Transportation needs - medical: Not on file  . Transportation needs - non-medical: Not on file  Occupational History  . Not on file  Tobacco Use  . Smoking status: Current Every Day Smoker    Packs/day: 2.00    Types: Cigarettes  . Smokeless tobacco: Never Used  Substance and Sexual Activity  . Alcohol use: Yes    Comment: occ  . Drug use: Yes    Types: Marijuana    Comment: daily  . Sexual activity: Not on file  Other Topics Concern  . Not on file  Social History Narrative  . Not on file   Current Meds  Medication Sig  . divalproex (DEPAKOTE) 500 MG DR tablet Take 1 tablet (500 mg total) by mouth 3 (three) times daily.  . hydrochlorothiazide (HYDRODIURIL) 25 MG tablet Take 1 tablet (25 mg total) by mouth  every morning. For high blood pressure  . lisinopril (PRINIVIL,ZESTRIL) 40 MG tablet Take 1 tablet (40 mg total) by mouth every morning. For high blood pressure  . risperiDONE (RISPERDAL) 2 MG tablet Take 2 mg by mouth at bedtime.  . traZODone (DESYREL) 150 MG tablet Take 150 mg by mouth at bedtime.  . VENTOLIN HFA 108 (90 Base) MCG/ACT inhaler Inhale 2 puffs into the lungs every 4 (four) hours as needed.   Allergies  Allergen Reactions  . Imitrex [Sumatriptan] Other (See Comments)    mania      ROS: As per HPI, remainder of ROS negative.   OBJECTIVE:   Vitals:   04/04/17 1531  BP: (!) 162/91  Pulse: 71  Resp: 20  Temp: 97.9 F (36.6 C)  TempSrc: Oral  SpO2: 100%     General appearance: alert; no distress Eyes: PERRL; EOMI; conjunctiva normal HENT: normocephalic; atraumatic; TMs normal, canal normal, external ears normal without trauma; nasal mucosa normal; oral mucosa normal Neck: supple Lungs: clear to auscultation bilaterally Heart: regular rate and rhythm Abdomen: soft, non-tender; bowel sounds normal; no masses or organomegaly; no guarding or rebound tenderness Back: no CVA tenderness Extremities:  no cyanosis or edema; symmetrical with no gross deformities Skin: warm and dry Neurologic: normal gait; grossly normal Psychological: alert and cooperative; normal mood and affect      Labs:  Results for orders placed or performed during the hospital encounter of 09/28/16  Comprehensive metabolic panel  Result Value Ref Range   Sodium 132 (L) 135 - 145 mmol/L   Potassium 3.3 (L) 3.5 - 5.1 mmol/L   Chloride 103 101 - 111 mmol/L   CO2 17 (L) 22 - 32 mmol/L   Glucose, Bld 92 65 - 99 mg/dL   BUN 20 6 - 20 mg/dL   Creatinine, Ser 1.52 (H) 0.61 - 1.24 mg/dL   Calcium 9.2 8.9 - 10.3 mg/dL   Total Protein 6.7 6.5 - 8.1 g/dL   Albumin 4.0 3.5 - 5.0 g/dL   AST 64 (H) 15 - 41 U/L   ALT 29 17 - 63 U/L   Alkaline Phosphatase 61 38 - 126 U/L   Total Bilirubin 1.0 0.3  - 1.2 mg/dL   GFR calc non Af Amer 49 (L) >60 mL/min   GFR calc Af Amer 57 (L) >60 mL/min   Anion gap 12 5 - 15  Ethanol  Result Value Ref Range   Alcohol, Ethyl (B) <5 <5 mg/dL  Salicylate level  Result Value Ref Range   Salicylate Lvl <4.0 2.8 - 30.0 mg/dL  Acetaminophen level  Result Value Ref Range   Acetaminophen (Tylenol), Serum <10 (L) 10 - 30 ug/mL  cbc  Result Value Ref Range   WBC 8.6 4.0 - 10.5 K/uL   RBC 4.20 (L) 4.22 - 5.81 MIL/uL   Hemoglobin 13.4 13.0 - 17.0 g/dL   HCT 38.3 (L) 39.0 - 52.0 %   MCV 91.2 78.0 - 100.0 fL   MCH 31.9 26.0 - 34.0 pg   MCHC 35.0 30.0 - 36.0 g/dL   RDW 14.4 11.5 - 15.5 %   Platelets 205 150 - 400 K/uL  Rapid urine drug screen (hospital performed)  Result Value Ref Range   Opiates NONE DETECTED NONE DETECTED   Cocaine NONE DETECTED NONE DETECTED   Benzodiazepines POSITIVE (A) NONE DETECTED   Amphetamines NONE DETECTED NONE DETECTED   Tetrahydrocannabinol POSITIVE (A) NONE DETECTED   Barbiturates NONE DETECTED NONE DETECTED    Labs Reviewed - No data to display  No results found.     ASSESSMENT & PLAN:  1. Migraine without aura and without status migrainosus, not intractable     Meds ordered this encounter  Medications  . ketorolac (TORADOL) injection 60 mg  . methylPREDNISolone acetate (DEPO-MEDROL) injection 40 mg  . topiramate (TOPAMAX) 50 MG tablet    Sig: Take 1 tablet (50 mg total) by mouth 2 (two) times daily.    Dispense:  30 tablet    Refill:  1    Reviewed expectations re: course of current medical issues. Questions answered. Outlined signs and symptoms indicating need for more acute intervention. Patient verbalized understanding. After Visit Summary given.    Procedures:      Robyn Haber, MD 04/04/17 [phone]

## 2017-04-04 NOTE — ED Triage Notes (Signed)
PT C/O: Migraine HA  ONSET: 3 days   SX ALSO INCLUDE: Sts pain is pounding and increases when lying down.   DENIES: n/v  TAKING MEDS:   A&O x4... NAD... Ambulatory

## 2017-04-07 DIAGNOSIS — F259 Schizoaffective disorder, unspecified: Secondary | ICD-10-CM | POA: Diagnosis not present

## 2017-04-09 ENCOUNTER — Ambulatory Visit (HOSPITAL_COMMUNITY)
Admission: EM | Admit: 2017-04-09 | Discharge: 2017-04-09 | Disposition: A | Discharge status: Home / Self Care | Payer: Medicare Other | Attending: Family Medicine | Admitting: Family Medicine

## 2017-04-09 DIAGNOSIS — G8929 Other chronic pain: Secondary | ICD-10-CM

## 2017-04-09 DIAGNOSIS — R51 Headache: Secondary | ICD-10-CM

## 2017-04-09 DIAGNOSIS — R110 Nausea: Secondary | ICD-10-CM

## 2017-04-09 DIAGNOSIS — R519 Headache, unspecified: Secondary | ICD-10-CM

## 2017-04-09 LAB — GLUCOSE, CAPILLARY: Glucose-Capillary: 178 mg/dL — ABNORMAL HIGH (ref 65–99)

## 2017-04-09 MED ORDER — METOCLOPRAMIDE HCL 5 MG/ML IJ SOLN
5.0000 mg | Freq: Once | INTRAMUSCULAR | Status: AC
  Administered 2017-04-09: 5 mg via INTRAMUSCULAR

## 2017-04-09 MED ORDER — KETOROLAC TROMETHAMINE 60 MG/2ML IM SOLN
60.0000 mg | Freq: Once | INTRAMUSCULAR | Status: AC
  Administered 2017-04-09: 60 mg via INTRAMUSCULAR

## 2017-04-09 MED ORDER — DEXAMETHASONE SODIUM PHOSPHATE 10 MG/ML IJ SOLN
10.0000 mg | Freq: Once | INTRAMUSCULAR | Status: AC
  Administered 2017-04-09: 10 mg via INTRAMUSCULAR

## 2017-04-09 MED ORDER — KETOROLAC TROMETHAMINE 60 MG/2ML IM SOLN
INTRAMUSCULAR | Status: AC
  Filled 2017-04-09: qty 2

## 2017-04-09 MED ORDER — METOCLOPRAMIDE HCL 5 MG/ML IJ SOLN
INTRAMUSCULAR | Status: AC
  Filled 2017-04-09: qty 2

## 2017-04-09 MED ORDER — DEXAMETHASONE SODIUM PHOSPHATE 10 MG/ML IJ SOLN
INTRAMUSCULAR | Status: AC
  Filled 2017-04-09: qty 1

## 2017-04-09 NOTE — ED Triage Notes (Signed)
Pt sts generalized HA x 4 days with hx of same; pt seen here Monday for same; pt sts increased stress recently but denies SI/HI

## 2017-04-09 NOTE — ED Provider Notes (Signed)
[place]    CSN: [id number] Arrival date & time: 04/09/17  1839     History   Chief Complaint Chief Complaint  Patient presents with  . Headache    HPI Joshua [name] is a 61 y.o. male.   61 year old male with history of bipolar, GAD, schizoaffective disorder, chronic pain syndrome comes in for 4 day history of headache. States headache is occipital and parietal. States throbbing pain, worse with "doing anything". States also with sinus congestion, rhinorrhea. States nausea without vomiting. Photophobia, phonophobia. Denies syncope, dizziness, weakness. He has been taking topomax without relief. He states given history of frequent headaches, he went to a "headache clinic" and they were going to provide him "injections to the head", but did not follow up due to financial issues. He states he was given information of pain clinic, and will be calling on Monday, but would like some relief of the headache today.       Past Medical History:  Diagnosis Date  . Back pain   . Bipolar disorder ([place])   . Cannabis use disorder, moderate, dependence ([place])   . Chronic pain   . Generalized anxiety disorder   . Hypertension   . Schizoaffective disorder [facility])     Patient Active Problem List   Diagnosis Date Noted  . Blister of right foot 10/29/2016  . Chronic pain syndrome 10/14/2016  . Osteoarthritis 10/14/2016  . T2DM (type 2 diabetes mellitus) ([place]) 10/14/2016  . HTN (hypertension) 10/14/2016  . Schizoaffective disorder, bipolar type ([place]) 05/10/2016  . Generalized anxiety disorder 05/04/2016    Past Surgical History:  Procedure Laterality Date  . APPENDECTOMY  1970       Home Medications    Prior to Admission medications   Medication Sig Start Date End Date Taking? Authorizing Provider  divalproex (DEPAKOTE) 500 MG DR tablet Take 1 tablet (500 mg total) by mouth 3 (three) times daily. 11/09/16   Clent Demark, PA-C  fluticasone [facility]) 50 MCG/ACT  nasal spray Place 2 sprays into both nostrils daily. 01/21/17   Tasia Catchings, Jaime Dome V, PA-C  glipiZIDE (GLUCOTROL) 10 MG tablet Take 1 tablet (10 mg total) by mouth 2 (two) times daily with a meal. For diabetes 10/15/16   [place] Bing, DO  hydrochlorothiazide (HYDRODIURIL) 25 MG tablet Take 1 tablet (25 mg total) by mouth every morning. For high blood pressure 10/14/16   Bacigalupo, Dionne Bucy, MD  lisinopril (PRINIVIL,ZESTRIL) 40 MG tablet Take 1 tablet (40 mg total) by mouth every morning. For high blood pressure 10/03/16   Lindell Spar I, NP  methylPREDNISolone (MEDROL DOSEPAK) 4 MG TBPK tablet Take 6-5-4-3-2-1 po qd 12/19/16   Lysbeth Penner, FNP  predniSONE (DELTASONE) 10 MG tablet Take 2 tablets by mouth today and tomorrow. Then 1 tablet by mouth day 3 and 4 then stop. 12/24/16   Katy Apo, NP  risperiDONE (RISPERDAL) 2 MG tablet Take 2 mg by mouth at bedtime.    [provider]  topiramate (TOPAMAX) 50 MG tablet Take 1 tablet (50 mg total) by mouth 2 (two) times daily. 04/04/17   Robyn Haber, MD  traZODone (DESYREL) 150 MG tablet Take 150 mg by mouth at bedtime. 10/13/16   [provider]  VENTOLIN HFA 108 (90 Base) MCG/ACT inhaler Inhale 2 puffs into the lungs every 4 (four) hours as needed. 10/14/16   Virginia Crews, MD    Family History Family History  Problem Relation Age of Onset  .  Diabetes Mother   . Cancer Mother        unsure what type  . Heart disease Mother   . Hypertension Mother   . Dementia Father   . Diabetes Brother   . Dementia Maternal Grandfather   . Cancer Paternal Grandmother 53       breast  . Mental illness Neg Hx     Social History Social History   Tobacco Use  . Smoking status: Current Every Day Smoker    Packs/day: 2.00    Types: Cigarettes  . Smokeless tobacco: Never Used  Substance Use Topics  . Alcohol use: Yes    Comment: occ  . Drug use: Yes    Types: Marijuana    Comment: daily     Allergies   Imitrex  [sumatriptan]   Review of Systems Review of Systems  Reason unable to perform ROS: See HPI as above.     Physical Exam Triage Vital Signs ED Triage Vitals [04/09/17 1913]  Enc Vitals Group     BP (!) 160/91     Pulse Rate 82     Resp 18     Temp 98.3 F (36.8 C)     Temp Source Oral     SpO2 98 %     Weight      Height      Head Circumference      Peak Flow      Pain Score 10     Pain Loc      Pain Edu?      Excl. in [place]?    No data found.  Updated Vital Signs BP (!) 160/91 (BP Location: Right Arm)   Pulse 82   Temp 98.3 F (36.8 C) (Oral)   Resp 18   SpO2 98%   Physical Exam  Constitutional: He is oriented to person, place, and time. He appears well-developed and well-nourished. No distress.  HENT:  Head: Normocephalic and atraumatic.  Right Ear: Tympanic membrane, external ear and ear canal normal. Tympanic membrane is not erythematous and not bulging.  Left Ear: Tympanic membrane, external ear and ear canal normal. Tympanic membrane is not erythematous and not bulging.  Nose: Nose normal. Right sinus exhibits no maxillary sinus tenderness and no frontal sinus tenderness. Left sinus exhibits no maxillary sinus tenderness and no frontal sinus tenderness.  Mouth/Throat: Uvula is midline, oropharynx is clear and moist and mucous membranes are normal.  Eyes: Conjunctivae and EOM are normal. Pupils are equal, round, and reactive to light.  Neck: Normal range of motion. Neck supple.  Cardiovascular: Normal rate, regular rhythm and normal heart sounds. Exam reveals no gallop and no friction rub.  No murmur heard. Pulmonary/Chest: Effort normal and breath sounds normal. He has no decreased breath sounds. He has no wheezes. He has no rhonchi. He has no rales.  Lymphadenopathy:    He has no cervical adenopathy.  Neurological: He is alert and oriented to person, place, and time. He has normal strength. He is not disoriented. No cranial nerve deficit or sensory deficit. He  displays a negative Romberg sign. Coordination and gait normal. GCS eye subscore is 4. GCS verbal subscore is 5. GCS motor subscore is 6.  Normal finger to nose, rapid movement.   Skin: Skin is warm and dry.  Psychiatric: He has a normal mood and affect. His behavior is normal. Judgment normal.     UC Treatments / Results  Labs (all labs ordered are listed, but only abnormal results are  displayed) Labs Reviewed  GLUCOSE, CAPILLARY - Abnormal; Notable for the following components:      Result Value   Glucose-Capillary 178 (*)    All other components within normal limits    EKG  EKG Interpretation None       Radiology No results found.  Procedures Procedures (including critical care time)  Medications Ordered in UC Medications  ketorolac (TORADOL) injection 60 mg (60 mg Intramuscular Given 04/09/17 2026)  metoCLOPramide (REGLAN) injection 5 mg (5 mg Intramuscular Given 04/09/17 2025)  dexamethasone (DECADRON) injection 10 mg (10 mg Intramuscular Given 04/09/17 2026)     Initial Impression / Assessment and Plan / UC Course  I have reviewed the triage vital signs and the nursing notes.  Pertinent labs & imaging results that were available during my care of the patient were reviewed by me and considered in my medical decision making (see chart for details).    Toradol, reglan, decadron given in office today. Patient to follow up with pain clinic for his chronic pain. Follow up with PCP for further management and evaluation of headache. Return precautions given.   Case discussed with Dr Mannie Stabile, who agrees to plan.   Final Clinical Impressions(s) / UC Diagnoses   Final diagnoses:  Chronic nonintractable headache, unspecified headache type    ED Discharge Orders    None        Arturo Morton 04/09/17 2135

## 2017-04-09 NOTE — Discharge Instructions (Signed)
Decadron, toradol and reglan injection in office today. Please call pain clinic to follow up for management of your chronic pain. Please follow up with PCP for further evaluation and management needed.

## 2017-04-21 DIAGNOSIS — E119 Type 2 diabetes mellitus without complications: Secondary | ICD-10-CM | POA: Diagnosis not present

## 2017-04-21 DIAGNOSIS — G43909 Migraine, unspecified, not intractable, without status migrainosus: Secondary | ICD-10-CM | POA: Diagnosis not present

## 2017-04-21 DIAGNOSIS — I10 Essential (primary) hypertension: Secondary | ICD-10-CM | POA: Diagnosis not present

## 2017-04-28 DIAGNOSIS — F319 Bipolar disorder, unspecified: Secondary | ICD-10-CM | POA: Diagnosis not present

## 2017-04-28 DIAGNOSIS — F259 Schizoaffective disorder, unspecified: Secondary | ICD-10-CM | POA: Diagnosis not present

## 2017-05-09 DIAGNOSIS — M549 Dorsalgia, unspecified: Secondary | ICD-10-CM | POA: Diagnosis not present

## 2017-05-09 DIAGNOSIS — F310 Bipolar disorder, current episode hypomanic: Secondary | ICD-10-CM | POA: Diagnosis not present

## 2017-05-09 DIAGNOSIS — R5383 Other fatigue: Secondary | ICD-10-CM | POA: Diagnosis not present

## 2017-05-09 DIAGNOSIS — F259 Schizoaffective disorder, unspecified: Secondary | ICD-10-CM | POA: Diagnosis not present

## 2017-05-09 DIAGNOSIS — G43909 Migraine, unspecified, not intractable, without status migrainosus: Secondary | ICD-10-CM | POA: Diagnosis not present

## 2017-05-15 ENCOUNTER — Other Ambulatory Visit: Payer: Self-pay

## 2017-05-15 ENCOUNTER — Encounter (HOSPITAL_COMMUNITY): Payer: Self-pay | Admitting: *Deleted

## 2017-05-15 ENCOUNTER — Ambulatory Visit (HOSPITAL_COMMUNITY)
Admission: EM | Admit: 2017-05-15 | Discharge: 2017-05-15 | Disposition: A | Discharge status: Home / Self Care | Payer: Medicare Other | Attending: Urgent Care | Admitting: Urgent Care

## 2017-05-15 DIAGNOSIS — Z720 Tobacco use: Secondary | ICD-10-CM | POA: Diagnosis not present

## 2017-05-15 DIAGNOSIS — J019 Acute sinusitis, unspecified: Secondary | ICD-10-CM

## 2017-05-15 DIAGNOSIS — R519 Headache, unspecified: Secondary | ICD-10-CM

## 2017-05-15 DIAGNOSIS — R0981 Nasal congestion: Secondary | ICD-10-CM | POA: Diagnosis not present

## 2017-05-15 DIAGNOSIS — F17200 Nicotine dependence, unspecified, uncomplicated: Secondary | ICD-10-CM

## 2017-05-15 DIAGNOSIS — R059 Cough, unspecified: Secondary | ICD-10-CM

## 2017-05-15 DIAGNOSIS — R05 Cough: Secondary | ICD-10-CM

## 2017-05-15 DIAGNOSIS — R51 Headache: Secondary | ICD-10-CM | POA: Diagnosis not present

## 2017-05-15 DIAGNOSIS — I10 Essential (primary) hypertension: Secondary | ICD-10-CM

## 2017-05-15 MED ORDER — AMLODIPINE BESYLATE 5 MG PO TABS: 5.0000 mg | ORAL_TABLET | Freq: Every day | ORAL | 0 refills | Status: DC

## 2017-05-15 MED ORDER — AMOXICILLIN 500 MG PO CAPS: 500.0000 mg | ORAL_CAPSULE | Freq: Three times a day (TID) | ORAL | 0 refills | Status: DC

## 2017-05-15 MED ORDER — METHYLPREDNISOLONE SODIUM SUCC 125 MG IJ SOLR
INTRAMUSCULAR | Status: AC
  Filled 2017-05-15: qty 2

## 2017-05-15 MED ORDER — METHYLPREDNISOLONE ACETATE 80 MG/ML IJ SUSP
INTRAMUSCULAR | Status: AC
  Filled 2017-05-15: qty 1

## 2017-05-15 MED ORDER — METHYLPREDNISOLONE SODIUM SUCC 125 MG IJ SOLR: 80.0000 mg | Freq: Once | INTRAMUSCULAR | Status: DC

## 2017-05-15 MED ORDER — METHYLPREDNISOLONE ACETATE 80 MG/ML IJ SUSP
80.0000 mg | Freq: Once | INTRAMUSCULAR | Status: AC
  Administered 2017-05-15: 80 mg via INTRAMUSCULAR

## 2017-05-15 NOTE — ED Triage Notes (Signed)
Per pt congestion, cough, headaches,

## 2017-05-15 NOTE — ED Provider Notes (Signed)
  MRN: [id number] DOB: 04/06/1956  Subjective:   Joshua [name] is a 62 y.o. male presenting for several month history of chronic sinus congestion, sinus headaches, intermittently productive cough. Cough can cause intermittent shob. Smokes 2-3ppd, has 50 year history of smoking. Has Advair, is not compliant with this. Has an albuterol inhaler and uses this as needed. Has a lot of emotional stress, difficulty with his wife. Denies confusion, chest pain, belly pain, hematuria. He has a PCP but is not sure what medications he is taking. His wife reports that he is taking triamterene-HCTZ, glipizide, Risperdal, Depakote, Ambien, Trazodone.   Joshua [name] is allergic to imitrex [sumatriptan].  Joshua [name]  has a past medical history of Back pain, Bipolar disorder ([place]), Cannabis use disorder, moderate, dependence ([place]), Chronic pain, Generalized anxiety disorder, Hypertension, and Schizoaffective disorder ([place]). Also  has a past surgical history that includes Appendectomy (1970).  Objective:   Vitals: BP (!) 159/89 (BP Location: Left Arm)   Pulse 81   Temp (!) 97.5 F (36.4 C) (Oral)   SpO2 100%   BP Readings from Last 3 Encounters:  05/15/17 (!) 159/89  04/09/17 (!) 160/91  04/04/17 (!) 162/91    Physical Exam  Constitutional: He is oriented to person, place, and time. He appears well-developed and well-nourished.  HENT:  Mouth/Throat: Oropharynx is clear and moist.  TM's intact bilaterally, no effusions or erythema. Nasal turbinates dry. Bilateral maxillary sinus tenderness. Oropharynx clear, mucous membranes moist.   Eyes: EOM are normal. Pupils are equal, round, and reactive to light. Right eye exhibits no discharge. Left eye exhibits no discharge.  Neck: Normal range of motion. Neck supple.  Cardiovascular: Normal rate, regular rhythm and intact distal pulses. Exam reveals no gallop and no friction rub.  No murmur heard. Pulmonary/Chest: No respiratory distress. He has wheezes (diffuse  throughout). He has no rales.  Lymphadenopathy:    He has no cervical adenopathy.  Neurological: He is alert and oriented to person, place, and time.  Skin: Skin is warm and dry.  Psychiatric: He has a normal mood and affect.   Assessment and Plan :   Sinus congestion  Cough  Sinus headache  Tobacco use disorder  Acute sinusitis, recurrence not specified, unspecified location  Essential hypertension  IM Depomedrol for diffuse wheezing, cough, chronic sinusitis. Start amoxicillin for sinusitis. Restart Advair. Start amlodipine for HTN, check back with PCP for continued management. Return-to-clinic precautions discussed, patient verbalized understanding.    Jaynee Eagles, PA-C 05/15/17 2240

## 2017-05-15 NOTE — Discharge Instructions (Addendum)
Start taking Advair twice daily. Use albuterol for shortness of breath, wheezing once every 6 hours as needed. Start Zyrtec once daily for chronic rhinitis. I'll have you take amoxicillin to address sinusitis. Start amlodipine for your better blood pressure control. Check in with your PCP within 1 week for management of your chronic medical conditions.

## 2017-05-18 DIAGNOSIS — F259 Schizoaffective disorder, unspecified: Secondary | ICD-10-CM | POA: Diagnosis not present

## 2017-05-28 ENCOUNTER — Ambulatory Visit (HOSPITAL_COMMUNITY)
Admission: EM | Admit: 2017-05-28 | Discharge: 2017-05-28 | Disposition: A | Discharge status: Home / Self Care | Payer: Medicare Other | Attending: Family Medicine | Admitting: Family Medicine

## 2017-05-28 ENCOUNTER — Encounter (HOSPITAL_COMMUNITY): Payer: Self-pay | Admitting: *Deleted

## 2017-05-28 DIAGNOSIS — F419 Anxiety disorder, unspecified: Secondary | ICD-10-CM

## 2017-05-28 DIAGNOSIS — R51 Headache: Secondary | ICD-10-CM | POA: Diagnosis not present

## 2017-05-28 DIAGNOSIS — J0100 Acute maxillary sinusitis, unspecified: Secondary | ICD-10-CM | POA: Diagnosis not present

## 2017-05-28 DIAGNOSIS — G8929 Other chronic pain: Secondary | ICD-10-CM

## 2017-05-28 DIAGNOSIS — R519 Headache, unspecified: Secondary | ICD-10-CM

## 2017-05-28 DIAGNOSIS — F411 Generalized anxiety disorder: Secondary | ICD-10-CM

## 2017-05-28 MED ORDER — DIAZEPAM 5 MG PO TABS: 5.0000 mg | ORAL_TABLET | Freq: Two times a day (BID) | ORAL | 0 refills | Status: DC | PRN

## 2017-05-28 MED ORDER — CEFDINIR 300 MG PO CAPS: 300.0000 mg | ORAL_CAPSULE | Freq: Two times a day (BID) | ORAL | 0 refills | Status: DC

## 2017-05-28 MED ORDER — KETOROLAC TROMETHAMINE 60 MG/2ML IM SOLN
INTRAMUSCULAR | Status: AC
  Filled 2017-05-28: qty 2

## 2017-05-28 MED ORDER — KETOROLAC TROMETHAMINE 60 MG/2ML IM SOLN
60.0000 mg | Freq: Once | INTRAMUSCULAR | Status: AC
  Administered 2017-05-28: 60 mg via INTRAMUSCULAR

## 2017-05-28 NOTE — Discharge Instructions (Signed)
Be aware, your anxiety medication may cause drowsiness. Please do not drive, operate heavy machinery or make important decisions while on this medication, it may cloud your judgement.  Keep your follow up appointment with your primary care doctor. You may follow up here as needed.

## 2017-05-28 NOTE — ED Triage Notes (Addendum)
Patient reports intermittent headaches since memorial weekend. Patient states he thinks he has a sinus infection. Patient is a current smoker. Patient also reports he feels tense, states he needs something for his nerves. Patient states that his eyes hurt, bilateral. Patient was just on amoxicillin for sinusitis.

## 2017-06-02 NOTE — ED Provider Notes (Signed)
Sarah Ann   [id number] 05/28/17 Arrival Time: [place]:  1. Acute maxillary sinusitis, recurrence not specified   2. Chronic nonintractable headache, unspecified headache type   3. Anxiety state    Meds ordered this encounter  Medications  . ketorolac (TORADOL) injection 60 mg  . cefdinir (OMNICEF) 300 MG capsule    Sig: Take 1 capsule (300 mg total) by mouth 2 (two) times daily.    Dispense:  20 capsule    Refill:  0  . diazepam (VALIUM) 5 MG tablet    Sig: Take 1 tablet (5 mg total) by mouth every 12 (twelve) hours as needed for anxiety.    Dispense:  15 tablet    Refill:  0   He agrees to schedule f/u with his PCP. Medication sedation precautions. Further Valium will need to be prescribed by his PCP. If acute worsening of symptoms he will proceed to the ED.  Reviewed expectations re: course of current medical issues. Questions answered. Outlined signs and symptoms indicating need for more acute intervention. Patient verbalized understanding. After Visit Summary given.   SUBJECTIVE:  Joshua [name] is a 62 y.o. male who presents with a PMH of chronic sinus infections and headaches. Reports return of sinus symptoms gradually over the past 2 weeks. Describes as ache in sinuses and teeth with associated frontal HA. Afebrile. OTC decongestant without much help. Also states increase in his anxiety lately. Out of Valium which he uses prn to help sleep. No respiratory symptoms.  ROS: As per HPI.   OBJECTIVE:  Vitals:   05/28/17 1705  BP: (!) 147/78  Pulse: 79  Resp: 19  Temp: 98 F (36.7 C)  TempSrc: Oral  SpO2: 97%    General appearance: alert; no distress but does appear anxious Eyes: PERRLA; EOMI; conjunctiva normal HENT: normocephalic; atraumatic; bilat maxillary sinus tenderness Neck: supple with FROM Lungs: clear to auscultation bilaterally Heart: regular rate and rhythm Extremities: no edema; symmetrical with no gross  deformities Skin: warm and dry Neurologic: CN 2-12 grossly intact; normal gait; normal symmetric reflexes; normal extremity strength and sensation throughout Psychological: alert and cooperative; normal mood and affect   Allergies  Allergen Reactions  . Imitrex [Sumatriptan] Other (See Comments)    mania    Past Medical History:  Diagnosis Date  . Back pain   . Bipolar disorder ([place])   . Cannabis use disorder, moderate, dependence ([place])   . Chronic pain   . Generalized anxiety disorder   . Hypertension   . Schizoaffective disorder [facility]    Social History   Socioeconomic History  . Marital status: Single    Spouse name: Not on file  . Number of children: Not on file  . Years of education: Not on file  . Highest education level: Not on file  Social Needs  . Financial resource strain: Not on file  . Food insecurity - worry: Not on file  . Food insecurity - inability: Not on file  . Transportation needs - medical: Not on file  . Transportation needs - non-medical: Not on file  Occupational History  . Not on file  Tobacco Use  . Smoking status: Current Every Day Smoker    Packs/day: 2.00    Types: Cigarettes  . Smokeless tobacco: Never Used  Substance and Sexual Activity  . Alcohol use: Yes    Comment: occ,   . Drug use: Yes    Types: Marijuana    Comment: daily  . Sexual  activity: Not on file  Other Topics Concern  . Not on file  Social History Narrative  . Not on file   Family History  Problem Relation Age of Onset  . Diabetes Mother   . Cancer Mother        unsure what type  . Heart disease Mother   . Hypertension Mother   . Dementia Father   . Diabetes Brother   . Dementia Maternal Grandfather   . Cancer Paternal Grandmother 49       breast  . Mental illness Neg Hx    Past Surgical History:  Procedure Laterality Date  . APPENDECTOMY  1970     Vanessa Kick, MD 06/02/17 [phone]

## 2017-06-06 DIAGNOSIS — F319 Bipolar disorder, unspecified: Secondary | ICD-10-CM | POA: Diagnosis not present

## 2017-06-06 DIAGNOSIS — Z79899 Other long term (current) drug therapy: Secondary | ICD-10-CM | POA: Diagnosis not present

## 2017-06-06 DIAGNOSIS — G894 Chronic pain syndrome: Secondary | ICD-10-CM | POA: Diagnosis not present

## 2017-06-06 DIAGNOSIS — F5105 Insomnia due to other mental disorder: Secondary | ICD-10-CM | POA: Diagnosis not present

## 2017-06-06 DIAGNOSIS — F250 Schizoaffective disorder, bipolar type: Secondary | ICD-10-CM | POA: Diagnosis not present

## 2017-06-20 ENCOUNTER — Other Ambulatory Visit: Payer: Self-pay

## 2017-06-20 ENCOUNTER — Ambulatory Visit (HOSPITAL_COMMUNITY)
Admission: EM | Admit: 2017-06-20 | Discharge: 2017-06-20 | Disposition: A | Discharge status: Home / Self Care | Payer: Medicare Other | Attending: Family Medicine | Admitting: Family Medicine

## 2017-06-20 ENCOUNTER — Other Ambulatory Visit: Payer: Self-pay | Admitting: Family Medicine

## 2017-06-20 ENCOUNTER — Encounter (HOSPITAL_COMMUNITY): Payer: Self-pay | Admitting: Emergency Medicine

## 2017-06-20 DIAGNOSIS — R51 Headache: Secondary | ICD-10-CM | POA: Diagnosis not present

## 2017-06-20 DIAGNOSIS — R519 Headache, unspecified: Secondary | ICD-10-CM

## 2017-06-20 MED ORDER — KETOROLAC TROMETHAMINE 60 MG/2ML IM SOLN
INTRAMUSCULAR | Status: AC
  Filled 2017-06-20: qty 2

## 2017-06-20 MED ORDER — KETOROLAC TROMETHAMINE 60 MG/2ML IM SOLN
60.0000 mg | Freq: Once | INTRAMUSCULAR | Status: AC
  Administered 2017-06-20: 60 mg via INTRAMUSCULAR

## 2017-06-20 MED ORDER — PROPRANOLOL HCL 60 MG PO TABS: 60.0000 mg | ORAL_TABLET | Freq: Two times a day (BID) | ORAL | 0 refills | Status: DC
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
## 2017-06-20 NOTE — Discharge Instructions (Signed)
Keep appt with your pcp. If he does not feel that you should be on the medicine I started you on today, that is fine. Follow his advice.

## 2017-06-20 NOTE — ED Triage Notes (Signed)
Patient complains of persistent headaches.  Patient complains of headache during the night and today.

## 2017-06-21 DIAGNOSIS — G8921 Chronic pain due to trauma: Secondary | ICD-10-CM | POA: Diagnosis not present

## 2017-06-21 DIAGNOSIS — J4521 Mild intermittent asthma with (acute) exacerbation: Secondary | ICD-10-CM | POA: Diagnosis not present

## 2017-06-21 DIAGNOSIS — E119 Type 2 diabetes mellitus without complications: Secondary | ICD-10-CM | POA: Diagnosis not present

## 2017-06-21 DIAGNOSIS — I10 Essential (primary) hypertension: Secondary | ICD-10-CM | POA: Diagnosis not present

## 2017-06-28 DIAGNOSIS — Z85828 Personal history of other malignant neoplasm of skin: Secondary | ICD-10-CM | POA: Diagnosis not present

## 2017-06-28 DIAGNOSIS — D224 Melanocytic nevi of scalp and neck: Secondary | ICD-10-CM | POA: Diagnosis not present

## 2017-06-28 DIAGNOSIS — C44529 Squamous cell carcinoma of skin of other part of trunk: Secondary | ICD-10-CM | POA: Diagnosis not present

## 2017-06-28 DIAGNOSIS — L570 Actinic keratosis: Secondary | ICD-10-CM | POA: Diagnosis not present

## 2017-07-07 ENCOUNTER — Encounter (HOSPITAL_COMMUNITY): Payer: Self-pay | Admitting: Emergency Medicine

## 2017-07-07 ENCOUNTER — Ambulatory Visit (HOSPITAL_COMMUNITY)
Admission: EM | Admit: 2017-07-07 | Discharge: 2017-07-07 | Disposition: A | Discharge status: Home / Self Care | Payer: Medicare Other | Attending: Internal Medicine | Admitting: Internal Medicine

## 2017-07-07 DIAGNOSIS — R519 Headache, unspecified: Secondary | ICD-10-CM

## 2017-07-07 DIAGNOSIS — R51 Headache: Secondary | ICD-10-CM

## 2017-07-07 MED ORDER — CETIRIZINE HCL 10 MG PO TABS: 10.0000 mg | ORAL_TABLET | Freq: Every day | ORAL | 0 refills | Status: DC

## 2017-07-07 MED ORDER — ONDANSETRON 4 MG PO TBDP
4.0000 mg | ORAL_TABLET | Freq: Once | ORAL | Status: AC
  Administered 2017-07-07: 4 mg via ORAL

## 2017-07-07 MED ORDER — ONDANSETRON 4 MG PO TBDP
ORAL_TABLET | ORAL | Status: AC
  Filled 2017-07-07: qty 1

## 2017-07-07 MED ORDER — KETOROLAC TROMETHAMINE 60 MG/2ML IM SOLN
INTRAMUSCULAR | Status: AC
  Filled 2017-07-07: qty 2

## 2017-07-07 MED ORDER — KETOROLAC TROMETHAMINE 60 MG/2ML IM SOLN
60.0000 mg | Freq: Once | INTRAMUSCULAR | Status: AC
  Administered 2017-07-07: 60 mg via INTRAMUSCULAR

## 2017-07-07 NOTE — ED Triage Notes (Signed)
PT reports frequent headaches recently. PT reports he has chronic sinus issues as well. PT reports today his headache started at 1pm and he got nauseated with the pain. Nausea is not new with his headaches.

## 2017-07-07 NOTE — ED Provider Notes (Signed)
Joshua [name]    CSN: [id number] Arrival date & time: 07/07/17  1814     History   Chief Complaint Chief Complaint  Patient presents with  . Headache  . Facial Pain    HPI Joshua [name] is a 62 y.o. male.   Joshua [name] presents with complaints of headache which started today at noon. Pain to posterior head and neck. Sinus congestion and symptoms as well, has not been taking any allergy medication. Does not take flonase as previously prescribed. History of migraines, states feels similar. Patient poor historian with which medications he is currently taking for headaches. Denies taking any ibuprofen or tylenol today. History of chronic pain, bipolar, htn.   ROS per HPI.       Past Medical History:  Diagnosis Date  . Back pain   . Bipolar disorder ([place])   . Cannabis use disorder, moderate, dependence ([place])   . Chronic pain   . Generalized anxiety disorder   . Hypertension   . Schizoaffective disorder [facility])     Patient Active Problem List   Diagnosis Date Noted  . Blister of right foot 10/29/2016  . Chronic pain syndrome 10/14/2016  . Osteoarthritis 10/14/2016  . T2DM (type 2 diabetes mellitus) ([place]) 10/14/2016  . HTN (hypertension) 10/14/2016  . Schizoaffective disorder, bipolar type ([place]) 05/10/2016  . Generalized anxiety disorder 05/04/2016    Past Surgical History:  Procedure Laterality Date  . APPENDECTOMY  1970       Home Medications    Prior to Admission medications   Medication Sig Start Date End Date Taking? Authorizing Provider  amLODipine (NORVASC) 5 MG tablet Take 1 tablet (5 mg total) by mouth daily. 05/15/17  Yes Jaynee Eagles, PA-C  divalproex (DEPAKOTE) 500 MG DR tablet Take 1 tablet (500 mg total) by mouth 3 (three) times daily. 11/09/16  Yes Clent Demark, PA-C  fluticasone [facility]) 50 MCG/ACT nasal spray Place 2 sprays into both nostrils daily. 01/21/17  Yes Yu, Amy V, PA-C  glipiZIDE (GLUCOTROL) 10 MG tablet Take 1 tablet (10  mg total) by mouth 2 (two) times daily with a meal. For diabetes 10/15/16  Yes [place] Bing, DO  lisinopril-hydrochlorothiazide (PRINZIDE,ZESTORETIC) 10-12.5 MG tablet Take 1 tablet by mouth daily.   Yes [provider]  propranolol (INDERAL) 60 MG tablet Take 1 tablet (60 mg total) by mouth 2 (two) times daily. 06/20/17  Yes Wendling, Crosby Oyster, DO  traMADol (ULTRAM) 50 MG tablet Take by mouth every 6 (six) hours as needed.   Yes [provider]  cetirizine (ZYRTEC) 10 MG tablet Take 1 tablet (10 mg total) by mouth daily. 07/07/17   Zigmund Gottron, NP  hydrochlorothiazide (HYDRODIURIL) 25 MG tablet Take 1 tablet (25 mg total) by mouth every morning. For high blood pressure 10/14/16   Bacigalupo, Dionne Bucy, MD  risperiDONE (RISPERDAL) 2 MG tablet Take 2 mg by mouth at bedtime.    [provider]  VENTOLIN HFA 108 (90 Base) MCG/ACT inhaler Inhale 2 puffs into the lungs every 4 (four) hours as needed. 10/14/16   Virginia Crews, MD  zolpidem (AMBIEN) 5 MG tablet Take 5 mg by mouth at bedtime as needed for sleep.    [provider]    Family History Family History  Problem Relation Age of Onset  . Diabetes Mother   . Cancer Mother        unsure what type  . Heart disease Mother   . Hypertension Mother   .  Dementia Father   . Diabetes Brother   . Dementia Maternal Grandfather   . Cancer Paternal Grandmother 46       breast  . Mental illness Neg Hx     Social History Social History   Tobacco Use  . Smoking status: Current Every Day Smoker    Packs/day: 2.00    Types: Cigarettes  . Smokeless tobacco: Never Used  Substance Use Topics  . Alcohol use: Yes    Comment: occ,   . Drug use: Yes    Types: Marijuana    Comment: daily     Allergies   Imitrex [sumatriptan]   Review of Systems Review of Systems   Physical Exam Triage Vital Signs ED Triage Vitals  Enc Vitals Group     BP 07/07/17 1946 (!) 148/80     Pulse Rate  07/07/17 1946 (!) 51     Resp 07/07/17 1946 16     Temp 07/07/17 1946 98.3 F (36.8 C)     Temp Source 07/07/17 1946 Oral     SpO2 07/07/17 1946 98 %     Weight 07/07/17 1948 200 lb (90.7 kg)     Height --      Head Circumference --      Peak Flow --      Pain Score 07/07/17 1947 9     Pain Loc --      Pain Edu? --      Excl. in [place]? --    No data found.  Updated Vital Signs BP (!) 148/80   Pulse (!) 51   Temp 98.3 F (36.8 C) (Oral)   Resp 16   Wt 200 lb (90.7 kg)   SpO2 98%   BMI 29.53 kg/m   Visual Acuity Right Eye Distance:   Left Eye Distance:   Bilateral Distance:    Right Eye Near:   Left Eye Near:    Bilateral Near:     Physical Exam  Constitutional: He is oriented to person, place, and time. He appears well-developed and well-nourished.  HENT:  Head: Normocephalic and atraumatic.  Right Ear: Tympanic membrane and ear canal normal.  Left Ear: Tympanic membrane and ear canal normal.  Nose: Nose normal. Right sinus exhibits no maxillary sinus tenderness and no frontal sinus tenderness. Left sinus exhibits no maxillary sinus tenderness and no frontal sinus tenderness.  Cardiovascular: Normal rate and regular rhythm.  Pulmonary/Chest: Effort normal and breath sounds normal.  Neurological: He is alert and oriented to person, place, and time. He has normal strength. He is not disoriented. No cranial nerve deficit or sensory deficit. GCS eye subscore is 4. GCS verbal subscore is 5. GCS motor subscore is 6.  Skin: Skin is warm and dry.     UC Treatments / Results  Labs (all labs ordered are listed, but only abnormal results are displayed) Labs Reviewed - No data to display  EKG  EKG Interpretation None       Radiology No results found.  Procedures Procedures (including critical care time)  Medications Ordered in UC Medications  ketorolac (TORADOL) injection 60 mg (not administered)  ondansetron (ZOFRAN-ODT) disintegrating tablet 4 mg (4 mg Oral  Given 07/07/17 2036)     Initial Impression / Assessment and Plan / UC Course  I have reviewed the triage vital signs and the nursing notes.  Pertinent labs & imaging results that were available during my care of the patient were reviewed by me and considered in my medical decision making (  see chart for details).     Without findings on exam. Patient walking in hallway, drinking a soda, talking to nurses etc, without acute distress. toradol and zofran provided. Zyrtec daily prescribed for allergies, encouraged use of flonase. Return precautions provided. Patient verbalized understanding and agreeable to plan.  Ambulatory out of clinic without difficulty.    Final Clinical Impressions(s) / UC Diagnoses   Final diagnoses:  Nonintractable episodic headache, unspecified headache type    ED Discharge Orders        Ordered    cetirizine (ZYRTEC) 10 MG tablet  Daily     07/07/17 2037       Controlled Substance Prescriptions [place] Controlled Substance Registry consulted? Not Applicable   Zigmund Gottron, NP 07/07/17 2043

## 2017-07-07 NOTE — Discharge Instructions (Signed)
Zyrtec daily to help with sinus symptoms. Push fluids to increase your hydration. Continue with prescribed medications for headache. If symptoms worsen or do not improve in the next week to return to be seen or to follow up with your PCP.

## 2017-07-19 DIAGNOSIS — F17210 Nicotine dependence, cigarettes, uncomplicated: Secondary | ICD-10-CM | POA: Diagnosis not present

## 2017-07-19 DIAGNOSIS — F319 Bipolar disorder, unspecified: Secondary | ICD-10-CM | POA: Diagnosis not present

## 2017-07-19 DIAGNOSIS — G43909 Migraine, unspecified, not intractable, without status migrainosus: Secondary | ICD-10-CM | POA: Diagnosis not present

## 2017-07-19 DIAGNOSIS — E119 Type 2 diabetes mellitus without complications: Secondary | ICD-10-CM | POA: Diagnosis not present

## 2017-07-22 ENCOUNTER — Other Ambulatory Visit: Payer: Self-pay

## 2017-07-22 ENCOUNTER — Encounter (HOSPITAL_COMMUNITY): Payer: Self-pay | Admitting: Emergency Medicine

## 2017-07-22 ENCOUNTER — Ambulatory Visit (HOSPITAL_COMMUNITY)
Admission: EM | Admit: 2017-07-22 | Discharge: 2017-07-22 | Disposition: A | Discharge status: Home / Self Care | Payer: Medicare Other | Attending: Family Medicine | Admitting: Family Medicine

## 2017-07-22 DIAGNOSIS — G44221 Chronic tension-type headache, intractable: Secondary | ICD-10-CM | POA: Diagnosis not present

## 2017-07-22 DIAGNOSIS — R51 Headache: Principal | ICD-10-CM

## 2017-07-22 DIAGNOSIS — G8929 Other chronic pain: Secondary | ICD-10-CM

## 2017-07-22 DIAGNOSIS — R519 Headache, unspecified: Secondary | ICD-10-CM

## 2017-07-22 MED ORDER — DEXAMETHASONE SODIUM PHOSPHATE 10 MG/ML IJ SOLN
INTRAMUSCULAR | Status: AC
  Filled 2017-07-22: qty 1

## 2017-07-22 MED ORDER — KETOROLAC TROMETHAMINE 60 MG/2ML IM SOLN
60.0000 mg | Freq: Once | INTRAMUSCULAR | Status: AC
Start: 2017-07-22 — End: 2017-07-22
  Administered 2017-07-22: 60 mg via INTRAMUSCULAR

## 2017-07-22 MED ORDER — KETOROLAC TROMETHAMINE 60 MG/2ML IM SOLN
INTRAMUSCULAR | Status: AC
  Filled 2017-07-22: qty 2

## 2017-07-22 MED ORDER — METOCLOPRAMIDE HCL 5 MG/ML IJ SOLN
5.0000 mg | Freq: Once | INTRAMUSCULAR | Status: AC
  Administered 2017-07-22: 5 mg via INTRAMUSCULAR

## 2017-07-22 MED ORDER — METOCLOPRAMIDE HCL 5 MG/ML IJ SOLN
INTRAMUSCULAR | Status: AC
  Filled 2017-07-22: qty 2

## 2017-07-22 MED ORDER — DEXAMETHASONE SODIUM PHOSPHATE 10 MG/ML IJ SOLN
10.0000 mg | Freq: Once | INTRAMUSCULAR | Status: AC
  Administered 2017-07-22: 10 mg via INTRAMUSCULAR
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
## 2017-07-22 NOTE — ED Provider Notes (Signed)
[place]    CSN: [id number] Arrival date & time: 07/22/17  1904     History   Chief Complaint Chief Complaint  Patient presents with  . Headache    HPI Joshua [name] is a 62 y.o. male.   62 year old male with history of bipolar disorder, chronic pain, schizoaffective disorder, DM, HTN comes in for 3-day history of headache.  He has been seen in urgent care multiple times for headache, they have all been around the frontal sinus region, and can radiate to the back.  States that he does see his primary care, but has not mentioned how often his headaches occur.  He has been taking Zyrtec and Flonase since last visit with some improvement.  States that PCP gave him some Vicodin to help with this pain, which she thinks may have started his current headache.  He is requesting Toradol, Decadron, Reglan injection to help with his symptoms.  States that he has seen headache clinic in the past, but was not able to afford the treatment that they were proposing.  Has been told he was going to be referred to pain clinic by his PCP, but states he never got a referral.  Denies ever seeing a neurologist for his symptoms.  Denies new injury/trauma that could have started the headache.     Past Medical History:  Diagnosis Date  . Back pain   . Bipolar disorder ([place])   . Cannabis use disorder, moderate, dependence ([place])   . Chronic pain   . Generalized anxiety disorder   . Hypertension   . Schizoaffective disorder [facility])     Patient Active Problem List   Diagnosis Date Noted  . Blister of right foot 10/29/2016  . Chronic pain syndrome 10/14/2016  . Osteoarthritis 10/14/2016  . T2DM (type 2 diabetes mellitus) ([place]) 10/14/2016  . HTN (hypertension) 10/14/2016  . Schizoaffective disorder, bipolar type ([place]) 05/10/2016  . Generalized anxiety disorder 05/04/2016    Past Surgical History:  Procedure Laterality Date  . APPENDECTOMY  1970       Home Medications    Prior  to Admission medications   Medication Sig Start Date End Date Taking? Authorizing Provider  acetaminophen (TYLENOL) 325 MG tablet Take 650 mg by mouth every 6 (six) hours as needed.   Yes [provider]  HYDROcodone-acetaminophen (NORCO/VICODIN) 5-325 MG tablet Take 1 tablet by mouth every 6 (six) hours as needed for moderate pain.   Yes [provider]  ibuprofen (ADVIL,MOTRIN) 200 MG tablet Take 200 mg by mouth every 6 (six) hours as needed.   Yes [provider]  cetirizine (ZYRTEC) 10 MG tablet Take 1 tablet (10 mg total) by mouth daily. 07/07/17   Zigmund Gottron, NP  divalproex (DEPAKOTE) 500 MG DR tablet Take 1 tablet (500 mg total) by mouth 3 (three) times daily. 11/09/16   Clent Demark, PA-C  fluticasone [facility]) 50 MCG/ACT nasal spray Place 2 sprays into both nostrils daily. 01/21/17   Tasia Catchings, Veleka Djordjevic V, PA-C  glipiZIDE (GLUCOTROL) 10 MG tablet Take 1 tablet (10 mg total) by mouth 2 (two) times daily with a meal. For diabetes 10/15/16   [place] Bing, DO  hydrochlorothiazide (HYDRODIURIL) 25 MG tablet Take 1 tablet (25 mg total) by mouth every morning. For high blood pressure 10/14/16   Bacigalupo, Dionne Bucy, MD  lisinopril-hydrochlorothiazide (PRINZIDE,ZESTORETIC) 10-12.5 MG tablet Take 1 tablet by mouth daily.    [provider]  propranolol (INDERAL) 60 MG tablet Take 1  tablet (60 mg total) by mouth 2 (two) times daily. 06/20/17   Shelda Pal, DO  risperiDONE (RISPERDAL) 2 MG tablet Take 2 mg by mouth at bedtime.    [provider]  traMADol (ULTRAM) 50 MG tablet Take by mouth every 6 (six) hours as needed.    [provider]  VENTOLIN HFA 108 (90 Base) MCG/ACT inhaler Inhale 2 puffs into the lungs every 4 (four) hours as needed. 10/14/16   Virginia Crews, MD  zolpidem (AMBIEN) 5 MG tablet Take 5 mg by mouth at bedtime as needed for sleep.    [provider]    Family History Family History  Problem  Relation Age of Onset  . Diabetes Mother   . Cancer Mother        unsure what type  . Heart disease Mother   . Hypertension Mother   . Dementia Father   . Diabetes Brother   . Dementia Maternal Grandfather   . Cancer Paternal Grandmother 37       breast  . Mental illness Neg Hx     Social History Social History   Tobacco Use  . Smoking status: Current Every Day Smoker    Packs/day: 2.00    Types: Cigarettes  . Smokeless tobacco: Never Used  Substance Use Topics  . Alcohol use: Yes    Comment: occ,   . Drug use: Yes    Types: Marijuana    Comment: daily     Allergies   Imitrex [sumatriptan]   Review of Systems Review of Systems  Eyes: Negative for photophobia, redness and visual disturbance.  Neurological: Positive for headaches. Negative for dizziness, tremors, syncope, facial asymmetry, speech difficulty, weakness, light-headedness and numbness.     Physical Exam Triage Vital Signs ED Triage Vitals  Enc Vitals Group     BP 07/22/17 1923 (!) 154/78     Pulse Rate 07/22/17 1923 60     Resp --      Temp 07/22/17 1923 97.9 F (36.6 C)     Temp Source 07/22/17 1923 Oral     SpO2 07/22/17 1923 99 %     Weight --      Height --      Head Circumference --      Peak Flow --      Pain Score 07/22/17 1921 10     Pain Loc --      Pain Edu? --      Excl. in [place]? --    No data found.  Updated Vital Signs BP (!) 154/78 (BP Location: Right Arm)   Pulse 60   Temp 97.9 F (36.6 C) (Oral)   SpO2 99%   Physical Exam  Constitutional: He is oriented to person, place, and time. He appears well-developed and well-nourished. No distress.  HENT:  Head: Normocephalic and atraumatic.  Eyes: Pupils are equal, round, and reactive to light. Conjunctivae and EOM are normal.  Neck: Normal range of motion. Neck supple.  Cardiovascular: Normal rate, regular rhythm and normal heart sounds. Exam reveals no gallop and no friction rub.  No murmur heard. Pulmonary/Chest: Effort  normal and breath sounds normal. No stridor. No respiratory distress. He has no wheezes. He has no rales.  Neurological: He is alert and oriented to person, place, and time. He is not disoriented. Coordination and gait normal. GCS eye subscore is 4. GCS verbal subscore is 5. GCS motor subscore is 6.  Skin: Skin is warm and dry.  UC Treatments / Results  Labs (all labs ordered are listed, but only abnormal results are displayed) Labs Reviewed - No data to display  EKG None Radiology No results found.  Procedures Procedures (including critical care time)  Medications Ordered in UC Medications  ketorolac (TORADOL) injection 60 mg (60 mg Intramuscular Given 07/22/17 1958)  metoCLOPramide (REGLAN) injection 5 mg (5 mg Intramuscular Given 07/22/17 1958)  dexamethasone (DECADRON) injection 10 mg (10 mg Intramuscular Given 07/22/17 1958)     Initial Impression / Assessment and Plan / UC Course  I have reviewed the triage vital signs and the nursing notes.  Pertinent labs & imaging results that were available during my care of the patient were reviewed by me and considered in my medical decision making (see chart for details).    Toradol, Reglan, Decadron injection in office today.  Patient with chronic headache, has not mentioned frequency of headaches to PCP.  Will have patient  follow-up with PCP for further management of headache.  And also follow-up with neurology or pain management for further management.  Return precautions given.  Final Clinical Impressions(s) / UC Diagnoses   Final diagnoses:  Chronic intractable headache, unspecified headache type    ED Discharge Orders    None        Arturo Morton 07/22/17 2017
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
## 2017-07-22 NOTE — Discharge Instructions (Signed)
Toradol, Reglan, Decadron injection in office today.  Please follow-up with PCP for further evaluation of chronic headache.  I have also attached information for pain management and neurology who may be able to help you manage your headache as well.

## 2017-07-28 ENCOUNTER — Other Ambulatory Visit: Payer: Self-pay | Admitting: Family Medicine

## 2017-08-19 ENCOUNTER — Ambulatory Visit (HOSPITAL_COMMUNITY)
Admission: EM | Admit: 2017-08-19 | Discharge: 2017-08-19 | Disposition: A | Discharge status: Home / Self Care | Payer: Medicare Other | Attending: Family Medicine | Admitting: Family Medicine

## 2017-08-19 ENCOUNTER — Encounter (HOSPITAL_COMMUNITY): Payer: Self-pay | Admitting: Family Medicine

## 2017-08-19 DIAGNOSIS — F515 Nightmare disorder: Secondary | ICD-10-CM

## 2017-08-19 DIAGNOSIS — R51 Headache: Secondary | ICD-10-CM

## 2017-08-19 DIAGNOSIS — G4700 Insomnia, unspecified: Secondary | ICD-10-CM | POA: Diagnosis not present

## 2017-08-19 DIAGNOSIS — R519 Headache, unspecified: Secondary | ICD-10-CM

## 2017-08-19 MED ORDER — METOCLOPRAMIDE HCL 5 MG/ML IJ SOLN
5.0000 mg | Freq: Once | INTRAMUSCULAR | Status: AC
  Administered 2017-08-19: 5 mg via INTRAMUSCULAR

## 2017-08-19 MED ORDER — CLONAZEPAM 0.5 MG PO TABS: 0.5000 mg | ORAL_TABLET | Freq: Every day | ORAL | 0 refills | Status: DC

## 2017-08-19 MED ORDER — METOCLOPRAMIDE HCL 5 MG/ML IJ SOLN
INTRAMUSCULAR | Status: AC
  Filled 2017-08-19: qty 2
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
## 2017-08-19 NOTE — ED Triage Notes (Signed)
Pt reports that he needs something to help him sleep. He has been having night mares which is giving him headaches.

## 2017-08-24 DIAGNOSIS — F250 Schizoaffective disorder, bipolar type: Secondary | ICD-10-CM | POA: Diagnosis not present

## 2017-08-24 DIAGNOSIS — F312 Bipolar disorder, current episode manic severe with psychotic features: Secondary | ICD-10-CM | POA: Diagnosis not present

## 2017-08-30 DIAGNOSIS — F250 Schizoaffective disorder, bipolar type: Secondary | ICD-10-CM | POA: Diagnosis not present

## 2017-08-30 DIAGNOSIS — E119 Type 2 diabetes mellitus without complications: Secondary | ICD-10-CM | POA: Diagnosis not present

## 2017-08-30 DIAGNOSIS — I10 Essential (primary) hypertension: Secondary | ICD-10-CM | POA: Diagnosis not present

## 2017-08-30 DIAGNOSIS — F310 Bipolar disorder, current episode hypomanic: Secondary | ICD-10-CM | POA: Diagnosis not present

## 2017-09-09 ENCOUNTER — Ambulatory Visit (HOSPITAL_COMMUNITY)
Admission: EM | Admit: 2017-09-09 | Discharge: 2017-09-09 | Disposition: A | Discharge status: Home / Self Care | Payer: Medicare Other | Attending: Family Medicine | Admitting: Family Medicine

## 2017-09-09 ENCOUNTER — Encounter (HOSPITAL_COMMUNITY): Payer: Self-pay

## 2017-09-09 DIAGNOSIS — S0181XA Laceration without foreign body of other part of head, initial encounter: Secondary | ICD-10-CM

## 2017-09-09 MED ORDER — LIDOCAINE-EPINEPHRINE (PF) 2 %-1:200000 IJ SOLN
INTRAMUSCULAR | Status: AC
  Filled 2017-09-09: qty 20

## 2017-09-09 NOTE — Discharge Instructions (Addendum)
Keep clean an dry Watch for infection Sutures out in 6-7 days
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
## 2017-09-17 ENCOUNTER — Ambulatory Visit (HOSPITAL_COMMUNITY)
Admission: EM | Admit: 2017-09-17 | Discharge: 2017-09-17 | Disposition: A | Discharge status: Home / Self Care | Payer: Medicare Other | Attending: Family Medicine | Admitting: Family Medicine

## 2017-09-17 ENCOUNTER — Encounter (HOSPITAL_COMMUNITY): Payer: Self-pay | Admitting: *Deleted

## 2017-09-17 DIAGNOSIS — Z5189 Encounter for other specified aftercare: Secondary | ICD-10-CM

## 2017-09-17 DIAGNOSIS — M7712 Lateral epicondylitis, left elbow: Secondary | ICD-10-CM

## 2017-09-17 DIAGNOSIS — S0181XD Laceration without foreign body of other part of head, subsequent encounter: Secondary | ICD-10-CM

## 2017-09-17 DIAGNOSIS — G44209 Tension-type headache, unspecified, not intractable: Secondary | ICD-10-CM

## 2017-09-17 MED ORDER — KETOROLAC TROMETHAMINE 60 MG/2ML IM SOLN
60.0000 mg | Freq: Once | INTRAMUSCULAR | Status: AC
  Administered 2017-09-17: 60 mg via INTRAMUSCULAR

## 2017-09-17 MED ORDER — DEXAMETHASONE SODIUM PHOSPHATE 10 MG/ML IJ SOLN
10.0000 mg | Freq: Once | INTRAMUSCULAR | Status: AC
Start: 2017-09-17 — End: 2017-09-17
  Administered 2017-09-17: 10 mg via INTRAMUSCULAR

## 2017-09-17 MED ORDER — KETOROLAC TROMETHAMINE 60 MG/2ML IM SOLN
INTRAMUSCULAR | Status: AC
  Filled 2017-09-17: qty 2

## 2017-09-17 MED ORDER — METHYLPREDNISOLONE ACETATE 40 MG/ML IJ SUSP
INTRAMUSCULAR | Status: AC
  Filled 2017-09-17: qty 1

## 2017-09-17 MED ORDER — DEXAMETHASONE SODIUM PHOSPHATE 10 MG/ML IJ SOLN: 10.0000 mg | Freq: Once | INTRAMUSCULAR | Status: DC

## 2017-09-17 MED ORDER — DEXAMETHASONE SODIUM PHOSPHATE 10 MG/ML IJ SOLN
INTRAMUSCULAR | Status: AC
  Filled 2017-09-17: qty 1

## 2017-09-17 NOTE — ED Provider Notes (Signed)
[place]   [id number] 09/17/17 Arrival Time: 1202   SUBJECTIVE:  Joshua [name] is a 62 y.o. male who presents to the urgent care with complaint of needing suture removal from eyebrow on left, headache that began this morning, and left tennis elbow.  Patient works as a Horticulturist, commercial and has periodically had to have his left elbow injected.  Past Medical History:  Diagnosis Date  . Back pain   . Bipolar disorder ([place])   . Cannabis use disorder, moderate, dependence ([place])   . Chronic pain   . Generalized anxiety disorder   . Hypertension   . Schizoaffective disorder ([place])    Family History  Problem Relation Age of Onset  . Diabetes Mother   . Cancer Mother        unsure what type  . Heart disease Mother   . Hypertension Mother   . Dementia Father   . Diabetes Brother   . Dementia Maternal Grandfather   . Cancer Paternal Grandmother 36       breast  . Mental illness Neg Hx    Social History   Socioeconomic History  . Marital status: Single    Spouse name: Not on file  . Number of children: Not on file  . Years of education: Not on file  . Highest education level: Not on file  Occupational History  . Not on file  Social Needs  . Financial resource strain: Not on file  . Food insecurity:    Worry: Not on file    Inability: Not on file  . Transportation needs:    Medical: Not on file    Non-medical: Not on file  Tobacco Use  . Smoking status: Current Every Day Smoker    Packs/day: 2.00    Types: Cigarettes  . Smokeless tobacco: Former Network engineer and Sexual Activity  . Alcohol use: Not Currently  . Drug use: Yes    Types: Marijuana    Comment: "have changed to hemp now" 09/17/17  . Sexual activity: Not on file  Lifestyle  . Physical activity:    Days per week: Not on file    Minutes per session: Not on file  . Stress: Not on file  Relationships  . Social connections:    Talks on phone: Not on file    Gets together: Not on file   Attends religious service: Not on file    Active member of club or organization: Not on file    Attends meetings of clubs or organizations: Not on file    Relationship status: Not on file  . Intimate partner violence:    Fear of current or ex partner: Not on file    Emotionally abused: Not on file    Physically abused: Not on file    Forced sexual activity: Not on file  Other Topics Concern  . Not on file  Social History Narrative  . Not on file   Current Meds  Medication Sig  . clonazePAM (KLONOPIN) 0.5 MG tablet Take 1 tablet (0.5 mg total) by mouth at bedtime.  . divalproex (DEPAKOTE) 500 MG DR tablet Take 1 tablet (500 mg total) by mouth 3 (three) times daily.  Marland Kitchen lisinopril-hydrochlorothiazide (PRINZIDE,ZESTORETIC) 10-12.5 MG tablet Take 1 tablet by mouth daily.  . traMADol (ULTRAM) 50 MG tablet Take by mouth every 6 (six) hours as needed.   Allergies  Allergen Reactions  . Imitrex [Sumatriptan] Other (See Comments)    mania  ROS: As per HPI, remainder of ROS negative.   OBJECTIVE:   Vitals:   09/17/17 1230  BP: (!) 98/58  Pulse: 77  Resp: 16  Temp: 98 F (36.7 C)  TempSrc: Oral  SpO2: 94%     General appearance: alert; no distress Eyes: PERRL; EOMI; conjunctiva normal HENT: normocephalic; atraumatic; TMs normal, canal normal, external ears normal without trauma; nasal mucosa normal; oral mucosa normal Neck: supple Back: no CVA tenderness Extremities: no cyanosis or edema; symmetrical with no gross deformities; tender left  lateral epicondyles Skin: warm and dry Neurologic: normal gait; grossly normal Psychological: alert and cooperative; normal mood and affect      Labs:  Results for orders placed or performed during the hospital encounter of 04/09/17  Glucose, capillary  Result Value Ref Range   Glucose-Capillary 178 (H) 65 - 99 mg/dL    Labs Reviewed - No data to display  No results found.     ASSESSMENT & PLAN:  1. Visit for  wound check   2. Acute non intractable tension-type headache   3. Left lateral epicondylitis     Meds ordered this encounter  Medications  . ketorolac (TORADOL) injection 60 mg  . dexamethasone (DECADRON) injection 10 mg    Reviewed expectations re: course of current medical issues. Questions answered. Outlined signs and symptoms indicating need for more acute intervention. Patient verbalized understanding. After Visit Summary given.    Procedures:after sterile prep using isopropyl alcohol, the left lateral epicondyle was injected with 20 mg of Depo-Medrol and 1 mL of Xylocaine. There were no complications      Robyn Haber, MD 09/17/17 1301

## 2017-09-17 NOTE — ED Triage Notes (Signed)
Sutures intact to left forehead/brow area without S/S infection.  Wound well-approximated. Pt also requesting cortisone shot for left elbow pain.

## 2017-09-19 ENCOUNTER — Ambulatory Visit (HOSPITAL_COMMUNITY)
Admission: EM | Admit: 2017-09-19 | Discharge: 2017-09-19 | Disposition: A | Discharge status: Home / Self Care | Payer: Medicare Other | Attending: Family Medicine | Admitting: Family Medicine

## 2017-09-19 ENCOUNTER — Encounter (HOSPITAL_COMMUNITY): Payer: Self-pay | Admitting: Family Medicine

## 2017-09-19 DIAGNOSIS — F5105 Insomnia due to other mental disorder: Secondary | ICD-10-CM | POA: Diagnosis not present

## 2017-09-19 DIAGNOSIS — N139 Obstructive and reflux uropathy, unspecified: Secondary | ICD-10-CM

## 2017-09-19 DIAGNOSIS — F99 Mental disorder, not otherwise specified: Secondary | ICD-10-CM

## 2017-09-19 LAB — POCT URINALYSIS DIP (DEVICE)
Bilirubin Urine: NEGATIVE
GLUCOSE, UA: NEGATIVE mg/dL
Hgb urine dipstick: NEGATIVE
KETONES UR: NEGATIVE mg/dL
Leukocytes, UA: NEGATIVE
NITRITE: NEGATIVE
PH: 7 (ref 5.0–8.0)
Protein, ur: NEGATIVE mg/dL
Specific Gravity, Urine: 1.015 (ref 1.005–1.030)
Urobilinogen, UA: 0.2 mg/dL (ref 0.0–1.0)

## 2017-09-19 MED ORDER — TAMSULOSIN HCL 0.4 MG PO CAPS: 0.4000 mg | ORAL_CAPSULE | Freq: Every day | ORAL | 0 refills | Status: DC

## 2017-09-19 NOTE — Discharge Instructions (Signed)
Please begin tamulosin for urinary symptoms  Follow up with [facility] for further management of insomnia.   You may try unisom over the counter, continue trazadone and risperidone

## 2017-09-19 NOTE — ED Triage Notes (Signed)
Pt reports that he is having insomnia and mania. He reports started since a injection he was given here a week ago.

## 2017-09-19 NOTE — ED Provider Notes (Signed)
[place]    CSN: [id number] Arrival date & time: 09/19/17  1153     History   Chief Complaint Chief Complaint  Patient presents with  . Insomnia    HPI Joshua [name] is a 62 y.o. male history of bipolar disorder, cannabis use, hypertension, type 2 diabetes, schizoaffective disorder presenting today for evaluation of insomnia as well as difficulty urinating.  Patient states that he has had difficulty sleeping over the past 3 days.  He also notes that he stopped taking his Risperdal for a few days which exacerbated his symptoms and mania.  He has restarted taking this as prescribed.  He states he is out of clonazepam which she has been using to help sleep.  He has tried trazodone 150 without help.  Goes to [facility] for [employer].  Patient also attributes insomnia from "a which sayonce " and "voodoo".  Patient also endorsing difficulty urinating, hesitation, weak stream.  He is requesting medicine for his enlarged prostate.  He states he does not want to go to urology.  HPI  Past Medical History:  Diagnosis Date  . Back pain   . Bipolar disorder ([place])   . Cannabis use disorder, moderate, dependence ([place])   . Chronic pain   . Generalized anxiety disorder   . Hypertension   . Schizoaffective disorder [facility])     Patient Active Problem List   Diagnosis Date Noted  . Blister of right foot 10/29/2016  . Chronic pain syndrome 10/14/2016  . Osteoarthritis 10/14/2016  . T2DM (type 2 diabetes mellitus) ([place]) 10/14/2016  . HTN (hypertension) 10/14/2016  . Schizoaffective disorder, bipolar type ([place]) 05/10/2016  . Generalized anxiety disorder 05/04/2016    Past Surgical History:  Procedure Laterality Date  . APPENDECTOMY  1970       Home Medications    Prior to Admission medications   Medication Sig Start Date End Date Taking? Authorizing Provider  clonazePAM (KLONOPIN) 0.5 MG tablet Take 1 tablet (0.5 mg total) by mouth at bedtime. 08/19/17    Robyn Haber, MD  divalproex (DEPAKOTE) 500 MG DR tablet Take 1 tablet (500 mg total) by mouth 3 (three) times daily. 11/09/16   Clent Demark, PA-C  hydrochlorothiazide (HYDRODIURIL) 25 MG tablet Take 1 tablet (25 mg total) by mouth every morning. For high blood pressure 10/14/16   Bacigalupo, Dionne Bucy, MD  ibuprofen (ADVIL,MOTRIN) 200 MG tablet Take 200 mg by mouth every 6 (six) hours as needed.    [provider]  lisinopril-hydrochlorothiazide (PRINZIDE,ZESTORETIC) 10-12.5 MG tablet Take 1 tablet by mouth daily.    [provider]  tamsulosin (FLOMAX) 0.4 MG CAPS capsule Take 1 capsule (0.4 mg total) by mouth daily. 09/19/17   Miliana Gangwer C, PA-C  traMADol (ULTRAM) 50 MG tablet Take by mouth every 6 (six) hours as needed.    [provider]  VENTOLIN HFA 108 (90 Base) MCG/ACT inhaler Inhale 2 puffs into the lungs every 4 (four) hours as needed. 10/14/16   Virginia Crews, MD    Family History Family History  Problem Relation Age of Onset  . Diabetes Mother   . Cancer Mother        unsure what type  . Heart disease Mother   . Hypertension Mother   . Dementia Father   . Diabetes Brother   . Dementia Maternal Grandfather   . Cancer Paternal Grandmother 70       breast  . Mental illness Neg Hx  Social History Social History   Tobacco Use  . Smoking status: Current Every Day Smoker    Packs/day: 2.00    Types: Cigarettes  . Smokeless tobacco: Former Network engineer Use Topics  . Alcohol use: Not Currently  . Drug use: Yes    Types: Marijuana    Comment: "have changed to hemp now" 09/17/17     Allergies   Imitrex [sumatriptan]   Review of Systems Review of Systems  Constitutional: Negative for chills and fever.  Eyes: Negative for visual disturbance.  Cardiovascular: Negative for chest pain and palpitations.  Gastrointestinal: Negative for abdominal pain, nausea and vomiting.  Genitourinary: Positive for decreased urine  volume, difficulty urinating and urgency. Negative for dysuria, frequency and hematuria.  Neurological: Negative for dizziness, weakness, light-headedness and headaches.  Psychiatric/Behavioral: Positive for sleep disturbance. Negative for suicidal ideas.  All other systems reviewed and are negative.    Physical Exam Triage Vital Signs ED Triage Vitals  Enc Vitals Group     BP 09/19/17 1311 135/87     Pulse Rate 09/19/17 1311 90     Resp 09/19/17 1311 18     Temp 09/19/17 1311 98.3 F (36.8 C)     Temp src --      SpO2 09/19/17 1311 95 %     Weight --      Height --      Head Circumference --      Peak Flow --      Pain Score 09/19/17 1253 0     Pain Loc --      Pain Edu? --      Excl. in [place]? --    No data found.  Updated Vital Signs BP 135/87   Pulse 90   Temp 98.3 F (36.8 C)   Resp 18   SpO2 95%   Visual Acuity Right Eye Distance:   Left Eye Distance:   Bilateral Distance:    Right Eye Near:   Left Eye Near:    Bilateral Near:     Physical Exam  Constitutional: He is oriented to person, place, and time. He appears well-developed and well-nourished.  HENT:  Head: Normocephalic and atraumatic.  Eyes: Conjunctivae are normal.  Neck: Neck supple.  Cardiovascular: Normal rate and regular rhythm.  No murmur heard. Pulmonary/Chest: Effort normal and breath sounds normal. No respiratory distress.  Musculoskeletal: He exhibits no edema.  Neurological: He is alert and oriented to person, place, and time.  Skin: Skin is warm and dry.  Psychiatric:  Patient very tangential in his thought process, discussing voodoo, black magic and witchcraft as causes of his insomnia.  Patient is redirectable and oriented towards his personal goals as he came here.  Patient does express signs of mania due to thought content as well as pacing the hallways; denies SI/HI.  Nursing note and vitals reviewed.    UC Treatments / Results  Labs (all labs ordered are listed, but only  abnormal results are displayed) Labs Reviewed  POCT URINALYSIS DIP (DEVICE)    EKG None  Radiology No results found.  Procedures Procedures (including critical care time)  Medications Ordered in UC Medications - No data to display  Initial Impression / Assessment and Plan / UC Course  I have reviewed the triage vital signs and the nursing notes.  Pertinent labs & imaging results that were available during my care of the patient were reviewed by me and considered in my medical decision making (see chart for details).  Discussed with patient insomnia is related to his psych conditions and this should be managed by his psychiatrist.  At this time do not feel refilling clonazepam as appropriate.  Discussed increasing trazodone, over-the-counter Unisom.  Given he is already on Depakote and risperidone, do not want to add in another psych medication.  Advised patient to follow-up with psych/[facility].  We will go ahead and initiate tamsulosin for a trial for lower obstructive urinary symptoms.  Discussed strict return precautions. Patient verbalized understanding and is agreeable with plan.  Final Clinical Impressions(s) / UC Diagnoses   Final diagnoses:  Insomnia due to other mental disorder  Lower urinary obstructive symptom     Discharge Instructions     Please begin tamulosin for urinary symptoms  Follow up with [facility] for further management of insomnia.   You may try unisom over the counter, continue trazadone and risperidone   ED Prescriptions    Medication Sig Dispense Auth. Provider   tamsulosin (FLOMAX) 0.4 MG CAPS capsule Take 1 capsule (0.4 mg total) by mouth daily. 30 capsule Tiffanye Hartmann C, PA-C     Controlled Substance Prescriptions [place] Controlled Substance Registry consulted? Not Applicable   Janith Lima, [place] 09/19/17 1808

## 2017-09-23 DIAGNOSIS — I10 Essential (primary) hypertension: Secondary | ICD-10-CM | POA: Diagnosis not present

## 2017-09-23 DIAGNOSIS — E782 Mixed hyperlipidemia: Secondary | ICD-10-CM | POA: Diagnosis not present

## 2017-09-23 DIAGNOSIS — R5383 Other fatigue: Secondary | ICD-10-CM | POA: Diagnosis not present

## 2017-09-23 DIAGNOSIS — E119 Type 2 diabetes mellitus without complications: Secondary | ICD-10-CM | POA: Diagnosis not present

## 2017-09-23 DIAGNOSIS — J209 Acute bronchitis, unspecified: Secondary | ICD-10-CM | POA: Diagnosis not present

## 2017-10-12 ENCOUNTER — Encounter (HOSPITAL_COMMUNITY): Payer: Self-pay | Admitting: Family Medicine

## 2017-10-12 ENCOUNTER — Ambulatory Visit (HOSPITAL_COMMUNITY)
Admission: EM | Admit: 2017-10-12 | Discharge: 2017-10-12 | Disposition: A | Discharge status: Home / Self Care | Payer: Medicare Other | Attending: Internal Medicine | Admitting: Internal Medicine

## 2017-10-12 DIAGNOSIS — W57XXXA Bitten or stung by nonvenomous insect and other nonvenomous arthropods, initial encounter: Secondary | ICD-10-CM | POA: Diagnosis not present

## 2017-10-12 DIAGNOSIS — L03115 Cellulitis of right lower limb: Secondary | ICD-10-CM

## 2017-10-12 DIAGNOSIS — S80861A Insect bite (nonvenomous), right lower leg, initial encounter: Secondary | ICD-10-CM

## 2017-10-12 MED ORDER — DOXYCYCLINE HYCLATE 100 MG PO CAPS: 100.0000 mg | ORAL_CAPSULE | Freq: Two times a day (BID) | ORAL | 0 refills | Status: DC

## 2017-10-12 NOTE — ED Triage Notes (Signed)
Pt here for insect bite to the LLE. Red, swollen and painful. This occurred 2 days ago.

## 2017-10-12 NOTE — Discharge Instructions (Addendum)
Clean site daily with mild soap and water Apply cold pack as needed for symptomatic relief Keep elevated when possible Take OTC medications as needed for pain management Follow up with PCP if symptoms persists Return or go to the ER if you have any new or worsening symptoms such as malaise, nausea, vomiting, fever, or myalgias

## 2017-10-12 NOTE — ED Provider Notes (Signed)
[place]   [id number] 10/12/17 Arrival Time: 1924  SUBJECTIVE:  Joshua [name] is a 62 y.o. male  history of bipolar disorder, cannabis use, hypertension, type 2 diabetes, schizoaffective disorder who presents with a insect bite to LLE that occurred 2 days ago.  Localizes the rash to LLE.  Describes it as worsening and pain as well as itching.  Has tried witch hazel and antiitch cream with temporary relief.  Denies aggravating.  Denies similar symptoms in the past that.   Complains of erythema, or swelling.  Denies fever, chills, nausea, vomiting,  glands, SOB, chest pain, abdominal pain, changes in bowel or bladder function.    ROS: As per HPI.  Past Medical History:  Diagnosis Date  . Back pain   . Bipolar disorder ([place])   . Cannabis use disorder, moderate, dependence ([place])   . Chronic pain   . Generalized anxiety disorder   . Hypertension   . Schizoaffective disorder [facility])    Past Surgical History:  Procedure Laterality Date  . APPENDECTOMY  1970   Allergies  Allergen Reactions  . Imitrex [Sumatriptan] Other (See Comments)    mania   No current facility-administered medications on file prior to encounter.    Current Outpatient Medications on File Prior to Encounter  Medication Sig Dispense Refill  . clonazePAM (KLONOPIN) 0.5 MG tablet Take 1 tablet (0.5 mg total) by mouth at bedtime. 30 tablet 0  . divalproex (DEPAKOTE) 500 MG DR tablet Take 1 tablet (500 mg total) by mouth 3 (three) times daily. 30 tablet 2  . hydrochlorothiazide (HYDRODIURIL) 25 MG tablet Take 1 tablet (25 mg total) by mouth every morning. For high blood pressure 30 tablet 0  . ibuprofen (ADVIL,MOTRIN) 200 MG tablet Take 200 mg by mouth every 6 (six) hours as needed.    Marland Kitchen lisinopril-hydrochlorothiazide (PRINZIDE,ZESTORETIC) 10-12.5 MG tablet Take 1 tablet by mouth daily.    . tamsulosin (FLOMAX) 0.4 MG CAPS capsule Take 1 capsule (0.4 mg total) by mouth daily. 30 capsule 0  . traMADol  (ULTRAM) 50 MG tablet Take by mouth every 6 (six) hours as needed.    . VENTOLIN HFA 108 (90 Base) MCG/ACT inhaler Inhale 2 puffs into the lungs every 4 (four) hours as needed. 1 Inhaler 0   Social History   Socioeconomic History  . Marital status: Single    Spouse name: Not on file  . Number of children: Not on file  . Years of education: Not on file  . Highest education level: Not on file  Occupational History  . Not on file  Social Needs  . Financial resource strain: Not on file  . Food insecurity:    Worry: Not on file    Inability: Not on file  . Transportation needs:    Medical: Not on file    Non-medical: Not on file  Tobacco Use  . Smoking status: Current Every Day Smoker    Packs/day: 2.00    Types: Cigarettes  . Smokeless tobacco: Former Network engineer and Sexual Activity  . Alcohol use: Not Currently  . Drug use: Yes    Types: Marijuana    Comment: "have changed to hemp now" 09/17/17  . Sexual activity: Not on file  Lifestyle  . Physical activity:    Days per week: Not on file    Minutes per session: Not on file  . Stress: Not on file  Relationships  . Social connections:    Talks on phone: Not on file  Gets together: Not on file    Attends religious service: Not on file    Active member of club or organization: Not on file    Attends meetings of clubs or organizations: Not on file    Relationship status: Not on file  . Intimate partner violence:    Fear of current or ex partner: Not on file    Emotionally abused: Not on file    Physically abused: Not on file    Forced sexual activity: Not on file  Other Topics Concern  . Not on file  Social History Narrative  . Not on file   Family History  Problem Relation Age of Onset  . Diabetes Mother   . Cancer Mother        unsure what type  . Heart disease Mother   . Hypertension Mother   . Dementia Father   . Diabetes Brother   . Dementia Maternal Grandfather   . Cancer Paternal Grandmother 53        breast  . Mental illness Neg Hx     OBJECTIVE: Vitals:   10/12/17 1938  BP: 126/69  Pulse: (!) 105  Resp: 18  Temp: 98.8 F (37.1 C)  SpO2: 95%    General appearance: alert; no distress Lungs: clear to auscultation bilaterally Heart: regular rate and rhythm.  Radial pulse 2+ bilaterally Extremities: no edema Skin: warm and dry; 1 cm circular area of eschar with 1.5 cm of surrounding erythema and induration; no fluctuance appreciate; clear drainage; mildly tender with palpation Psychological: alert and cooperative; normal mood and affect      ASSESSMENT & PLAN:  1. Insect bite of right lower leg, initial encounter   2. Cellulitis of right lower extremity     Meds ordered this encounter  Medications  . doxycycline (VIBRAMYCIN) 100 MG capsule    Sig: Take 1 capsule (100 mg total) by mouth 2 (two) times daily.    Dispense:  20 capsule    Refill:  0    Order Specific Question:   Supervising Provider    Answer:   Wynona Luna [[id number]]    Clean site daily with mild soap and water Apply cold pack as needed for symptomatic relief Keep elevated when possible Take OTC medications as needed for pain management Follow up with PCP if symptoms persists Return or go to the ER if you have any new or worsening symptoms such as malaise, nausea, vomiting, fever, or myalgias   Reviewed expectations re: course of current medical issues. Questions answered. Outlined signs and symptoms indicating need for more acute intervention. Patient verbalized understanding. After Visit Summary given.   Lestine Box, PA-C 10/12/17 2028

## 2017-10-14 ENCOUNTER — Ambulatory Visit (HOSPITAL_COMMUNITY)
Admission: EM | Admit: 2017-10-14 | Discharge: 2017-10-14 | Disposition: A | Discharge status: Home / Self Care | Payer: Medicare Other | Attending: Internal Medicine | Admitting: Internal Medicine

## 2017-10-14 ENCOUNTER — Encounter (HOSPITAL_COMMUNITY): Payer: Self-pay | Admitting: Emergency Medicine

## 2017-10-14 DIAGNOSIS — Z23 Encounter for immunization: Secondary | ICD-10-CM | POA: Diagnosis not present

## 2017-10-14 DIAGNOSIS — L03115 Cellulitis of right lower limb: Secondary | ICD-10-CM | POA: Diagnosis not present

## 2017-10-14 DIAGNOSIS — W57XXXD Bitten or stung by nonvenomous insect and other nonvenomous arthropods, subsequent encounter: Secondary | ICD-10-CM | POA: Diagnosis not present

## 2017-10-14 DIAGNOSIS — S80861D Insect bite (nonvenomous), right lower leg, subsequent encounter: Secondary | ICD-10-CM

## 2017-10-14 MED ORDER — TETANUS-DIPHTH-ACELL PERTUSSIS 5-2.5-18.5 LF-MCG/0.5 IM SUSP
INTRAMUSCULAR | Status: AC
  Filled 2017-10-14: qty 0.5

## 2017-10-14 MED ORDER — METHYLPREDNISOLONE SODIUM SUCC 125 MG IJ SOLR
INTRAMUSCULAR | Status: AC
  Filled 2017-10-14: qty 2

## 2017-10-14 MED ORDER — METHYLPREDNISOLONE SODIUM SUCC 125 MG IJ SOLR
125.0000 mg | Freq: Once | INTRAMUSCULAR | Status: AC
  Administered 2017-10-14: 125 mg via INTRAMUSCULAR

## 2017-10-14 MED ORDER — TETANUS-DIPHTH-ACELL PERTUSSIS 5-2.5-18.5 LF-MCG/0.5 IM SUSP
0.5000 mL | Freq: Once | INTRAMUSCULAR | Status: AC
  Administered 2017-10-14: 0.5 mL via INTRAMUSCULAR

## 2017-10-14 NOTE — Discharge Instructions (Addendum)
Tetanus booster given in office Prednisone shot given in office Continue antibiotic as prescribed and to completion Clean site daily with warm water and soap Follow up with PCP if symptoms persists Return or go to the ER if you have any new or worsening symptoms such as malaise, nausea, vomiting, fever, or myalgias

## 2017-10-14 NOTE — ED Provider Notes (Signed)
[place]   [id number] 10/14/17 Arrival Time: 1955  SUBJECTIVE:  Joshua [name] is a 62 y.o. male history of bipolar disorder, cannabis use, hypertension, type 2 diabetes, schizoaffective disorder who presents with a insect bite to LLE that occurred 4 days ago. Was seen a [facility] and prescribed doxycycline on 10/12/17.  Reports increased swelling and redness.  Localizes the bite to LLE.  Describes it as itching.  Has been compliant with antibiotic.  Denies aggravating factors.  Denies similar symptoms in the past that.   Complains of erythema, and swelling.  Denies fever, chills, nausea, vomiting,  glands, SOB, chest pain, abdominal pain, changes in bowel or bladder function.     ROS: As per HPI.  Past Medical History:  Diagnosis Date  . Back pain   . Bipolar disorder ([place])   . Cannabis use disorder, moderate, dependence ([place])   . Chronic pain   . Generalized anxiety disorder   . Hypertension   . Schizoaffective disorder [facility])    Past Surgical History:  Procedure Laterality Date  . APPENDECTOMY  1970   Allergies  Allergen Reactions  . Imitrex [Sumatriptan] Other (See Comments)    mania   No current facility-administered medications on file prior to encounter.    Current Outpatient Medications on File Prior to Encounter  Medication Sig Dispense Refill  . clonazePAM (KLONOPIN) 0.5 MG tablet Take 1 tablet (0.5 mg total) by mouth at bedtime. 30 tablet 0  . divalproex (DEPAKOTE) 500 MG DR tablet Take 1 tablet (500 mg total) by mouth 3 (three) times daily. 30 tablet 2  . doxycycline (VIBRAMYCIN) 100 MG capsule Take 1 capsule (100 mg total) by mouth 2 (two) times daily. 20 capsule 0  . hydrochlorothiazide (HYDRODIURIL) 25 MG tablet Take 1 tablet (25 mg total) by mouth every morning. For high blood pressure 30 tablet 0  . ibuprofen (ADVIL,MOTRIN) 200 MG tablet Take 200 mg by mouth every 6 (six) hours as needed.    Marland Kitchen lisinopril-hydrochlorothiazide (PRINZIDE,ZESTORETIC)  10-12.5 MG tablet Take 1 tablet by mouth daily.    . tamsulosin (FLOMAX) 0.4 MG CAPS capsule Take 1 capsule (0.4 mg total) by mouth daily. 30 capsule 0  . traMADol (ULTRAM) 50 MG tablet Take by mouth every 6 (six) hours as needed.    . VENTOLIN HFA 108 (90 Base) MCG/ACT inhaler Inhale 2 puffs into the lungs every 4 (four) hours as needed. 1 Inhaler 0   Social History   Socioeconomic History  . Marital status: Single    Spouse name: Not on file  . Number of children: Not on file  . Years of education: Not on file  . Highest education level: Not on file  Occupational History  . Not on file  Social Needs  . Financial resource strain: Not on file  . Food insecurity:    Worry: Not on file    Inability: Not on file  . Transportation needs:    Medical: Not on file    Non-medical: Not on file  Tobacco Use  . Smoking status: Current Every Day Smoker    Packs/day: 2.00    Types: Cigarettes  . Smokeless tobacco: Former Network engineer and Sexual Activity  . Alcohol use: Not Currently  . Drug use: Yes    Types: Marijuana    Comment: "have changed to hemp now" 09/17/17  . Sexual activity: Not on file  Lifestyle  . Physical activity:    Days per week: Not on file  Minutes per session: Not on file  . Stress: Not on file  Relationships  . Social connections:    Talks on phone: Not on file    Gets together: Not on file    Attends religious service: Not on file    Active member of club or organization: Not on file    Attends meetings of clubs or organizations: Not on file    Relationship status: Not on file  . Intimate partner violence:    Fear of current or ex partner: Not on file    Emotionally abused: Not on file    Physically abused: Not on file    Forced sexual activity: Not on file  Other Topics Concern  . Not on file  Social History Narrative  . Not on file   Family History  Problem Relation Age of Onset  . Diabetes Mother   . Cancer Mother        unsure what type    . Heart disease Mother   . Hypertension Mother   . Dementia Father   . Diabetes Brother   . Dementia Maternal Grandfather   . Cancer Paternal Grandmother 55       breast  . Mental illness Neg Hx     OBJECTIVE: Vitals:   10/14/17 1959  BP: 111/76  Pulse: (!) 103  Resp: 18  Temp: 98.3 F (36.8 C)  SpO2: 98%    General appearance: alert; no distress Lungs: clear to auscultation bilaterally Heart: regular rate and rhythm.  Radial pulse 2+ bilaterally Extremities: no edema Skin: warm and dry; 1 cm circular area of eschar with 3-4 cm of surrounding erythema likely due to patient scratching; no fluctuance appreciated; no drainage, no tenderness to palpation Psychological: alert and cooperative; normal mood and affect      ASSESSMENT & PLAN:  1. Insect bite of right lower leg, subsequent encounter   2. Cellulitis of right lower extremity     Meds ordered this encounter  Medications  . Tdap (BOOSTRIX) injection 0.5 mL    Tetanus booster given in office Prednisone shot given in office Continue antibiotic as prescribed and to completion Clean site daily with warm water and soap Follow up with PCP if symptoms persists Return or go to the ER if you have any new or worsening symptoms such as malaise, nausea, vomiting, fever, or myalgias  Reviewed expectations re: course of current medical issues. Questions answered. Outlined signs and symptoms indicating need for more acute intervention. Patient verbalized understanding. After Visit Summary given.   Lestine Box, PA-C 10/14/17 2056

## 2017-10-14 NOTE — ED Triage Notes (Signed)
Pt seen here 6/19 for possible spider bite, told to come back if its worse, states the redness is swelling.

## 2017-10-26 DIAGNOSIS — Z1159 Encounter for screening for other viral diseases: Secondary | ICD-10-CM | POA: Diagnosis not present

## 2017-10-26 DIAGNOSIS — R351 Nocturia: Secondary | ICD-10-CM | POA: Diagnosis not present

## 2017-10-26 DIAGNOSIS — G43909 Migraine, unspecified, not intractable, without status migrainosus: Secondary | ICD-10-CM | POA: Diagnosis not present

## 2017-10-26 DIAGNOSIS — Z119 Encounter for screening for infectious and parasitic diseases, unspecified: Secondary | ICD-10-CM | POA: Diagnosis not present

## 2017-10-26 DIAGNOSIS — F17210 Nicotine dependence, cigarettes, uncomplicated: Secondary | ICD-10-CM | POA: Diagnosis not present

## 2017-11-28 DIAGNOSIS — G43911 Migraine, unspecified, intractable, with status migrainosus: Secondary | ICD-10-CM | POA: Diagnosis not present

## 2017-11-28 DIAGNOSIS — F310 Bipolar disorder, current episode hypomanic: Secondary | ICD-10-CM | POA: Diagnosis not present

## 2017-11-28 DIAGNOSIS — F17200 Nicotine dependence, unspecified, uncomplicated: Secondary | ICD-10-CM | POA: Diagnosis not present

## 2017-11-28 DIAGNOSIS — D649 Anemia, unspecified: Secondary | ICD-10-CM | POA: Diagnosis not present

## 2018-01-01 ENCOUNTER — Ambulatory Visit (HOSPITAL_COMMUNITY)
Admission: EM | Admit: 2018-01-01 | Discharge: 2018-01-01 | Disposition: A | Discharge status: Home / Self Care | Payer: Medicare Other | Attending: Internal Medicine | Admitting: Internal Medicine

## 2018-01-01 ENCOUNTER — Encounter (HOSPITAL_COMMUNITY): Payer: Self-pay | Admitting: Emergency Medicine

## 2018-01-01 DIAGNOSIS — Z760 Encounter for issue of repeat prescription: Secondary | ICD-10-CM

## 2018-01-01 DIAGNOSIS — G894 Chronic pain syndrome: Secondary | ICD-10-CM

## 2018-01-01 MED ORDER — KETOROLAC TROMETHAMINE 60 MG/2ML IM SOLN: 60.0000 mg | Freq: Once | INTRAMUSCULAR | Status: DC

## 2018-01-01 MED ORDER — TRAMADOL HCL 50 MG PO TABS: 100.0000 mg | ORAL_TABLET | Freq: Three times a day (TID) | ORAL | 0 refills | Status: AC

## 2018-01-01 NOTE — ED Triage Notes (Addendum)
Pt states he takes chronic pain medication, does not have PCP at the moment. Pt requesting tramadol refill.

## 2018-01-01 NOTE — Discharge Instructions (Addendum)
Please continue to try and get set up with pain management, consider following up with your previous back doctor for spinal injections for your pain  I have refilled your Tramadol for 1 week.  Please follow up if developing worsening pain

## 2018-01-02 DIAGNOSIS — Z79891 Long term (current) use of opiate analgesic: Secondary | ICD-10-CM | POA: Diagnosis not present

## 2018-01-02 DIAGNOSIS — G894 Chronic pain syndrome: Secondary | ICD-10-CM | POA: Diagnosis not present

## 2018-01-02 DIAGNOSIS — Z5181 Encounter for therapeutic drug level monitoring: Secondary | ICD-10-CM | POA: Diagnosis not present

## 2018-01-02 DIAGNOSIS — M5136 Other intervertebral disc degeneration, lumbar region: Secondary | ICD-10-CM | POA: Diagnosis not present

## 2018-01-02 NOTE — ED Provider Notes (Addendum)
[place]    CSN: [id number] Arrival date & time: 01/01/18  1557     History   Chief Complaint Chief Complaint  Patient presents with  . Medication Refill    HPI Joshua [name] is a 62 y.o. male history of bipolar, hypertension, schizoaffective, chronic pain presenting today for his pain and requesting medication refill.  Patient states that he has been taking tramadol 100 mg 3 times a day, he has had this filled by his PCP and getting 1 months worth at a time.  He states that recently he went and he was only provided with 15 days worth and was told that he could not have a full month's worth, as he needs to get set up with pain management.  He states that he plans to follow-up tomorrow to fill out a form to initiate the pain management referral.  He initially did not want to go to pain management as he thought they would not prescribe him his medicines.  He relays a long story of why he needs medicines and how he previously fractured his spine and has chronic pain because of it.  He states that when he does not have medicine he begins to feel sick.  In the interim he has been taking ibuprofen and Tylenol at the same time, but states that he is still having pain.  HPI  Past Medical History:  Diagnosis Date  . Back pain   . Bipolar disorder ([place])   . Cannabis use disorder, moderate, dependence ([place])   . Chronic pain   . Generalized anxiety disorder   . Hypertension   . Schizoaffective disorder [facility])     Patient Active Problem List   Diagnosis Date Noted  . Blister of right foot 10/29/2016  . Chronic pain syndrome 10/14/2016  . Osteoarthritis 10/14/2016  . T2DM (type 2 diabetes mellitus) ([place]) 10/14/2016  . HTN (hypertension) 10/14/2016  . Schizoaffective disorder, bipolar type ([place]) 05/10/2016  . Generalized anxiety disorder 05/04/2016    Past Surgical History:  Procedure Laterality Date  . APPENDECTOMY  1970       Home Medications    Prior to  Admission medications   Medication Sig Start Date End Date Taking? Authorizing Provider  clonazePAM (KLONOPIN) 0.5 MG tablet Take 1 tablet (0.5 mg total) by mouth at bedtime. 08/19/17   Robyn Haber, MD  divalproex (DEPAKOTE) 500 MG DR tablet Take 1 tablet (500 mg total) by mouth 3 (three) times daily. 11/09/16   Clent Demark, PA-C  doxycycline (VIBRAMYCIN) 100 MG capsule Take 1 capsule (100 mg total) by mouth 2 (two) times daily. 10/12/17   Wurst, [place], PA-C  hydrochlorothiazide (HYDRODIURIL) 25 MG tablet Take 1 tablet (25 mg total) by mouth every morning. For high blood pressure 10/14/16   Bacigalupo, Dionne Bucy, MD  ibuprofen (ADVIL,MOTRIN) 200 MG tablet Take 200 mg by mouth every 6 (six) hours as needed.    [provider]  lisinopril-hydrochlorothiazide (PRINZIDE,ZESTORETIC) 10-12.5 MG tablet Take 1 tablet by mouth daily.    [provider]  tamsulosin (FLOMAX) 0.4 MG CAPS capsule Take 1 capsule (0.4 mg total) by mouth daily. 09/19/17   Tedd Cottrill C, PA-C  traMADol (ULTRAM) 50 MG tablet Take 2 tablets (100 mg total) by mouth 3 (three) times daily for 7 days. 01/01/18 01/08/18  Jaylin Benzel C, PA-C  VENTOLIN HFA 108 (90 Base) MCG/ACT inhaler Inhale 2 puffs into the lungs every 4 (four) hours as needed. 10/14/16   Lavon Paganini  M, MD    Family History Family History  Problem Relation Age of Onset  . Diabetes Mother   . Cancer Mother        unsure what type  . Heart disease Mother   . Hypertension Mother   . Dementia Father   . Diabetes Brother   . Dementia Maternal Grandfather   . Cancer Paternal Grandmother 25       breast  . Mental illness Neg Hx     Social History Social History   Tobacco Use  . Smoking status: Current Every Day Smoker    Packs/day: 2.00    Types: Cigarettes  . Smokeless tobacco: Former Network engineer Use Topics  . Alcohol use: Not Currently  . Drug use: Yes    Types: Marijuana    Comment: "have changed to hemp now"  09/17/17     Allergies   Imitrex [sumatriptan]   Review of Systems Review of Systems  Constitutional: Negative for fatigue and fever.  HENT: Negative for congestion, sinus pressure and sore throat.   Eyes: Negative for photophobia, pain and visual disturbance.  Respiratory: Negative for cough and shortness of breath.   Cardiovascular: Negative for chest pain.  Gastrointestinal: Negative for abdominal pain, nausea and vomiting.  Genitourinary: Negative for decreased urine volume and hematuria.  Musculoskeletal: Positive for arthralgias, back pain and myalgias. Negative for neck pain and neck stiffness.  Neurological: Positive for headaches. Negative for dizziness, syncope, facial asymmetry, speech difficulty, weakness, light-headedness and numbness.     Physical Exam Triage Vital Signs ED Triage Vitals [01/01/18 1642]  Enc Vitals Group     BP (!) 178/92     Pulse Rate 89     Resp 18     Temp 98.2 F (36.8 C)     Temp Source Temporal     SpO2 100 %     Weight      Height      Head Circumference      Peak Flow      Pain Score      Pain Loc      Pain Edu?      Excl. in [place]?    No data found.  Updated Vital Signs BP (!) 178/92   Pulse 89   Temp 98.2 F (36.8 C) (Temporal)   Resp 18   SpO2 100%   Visual Acuity Right Eye Distance:   Left Eye Distance:   Bilateral Distance:    Right Eye Near:   Left Eye Near:    Bilateral Near:     Physical Exam  Constitutional: He is oriented to person, place, and time. He appears well-developed and well-nourished.  No acute distress  HENT:  Head: Normocephalic and atraumatic.  Nose: Nose normal.  Eyes: Conjunctivae are normal.  Neck: Neck supple.  Cardiovascular: Normal rate.  Pulmonary/Chest: Effort normal. No respiratory distress.  Abdominal: He exhibits no distension.  Musculoskeletal: Normal range of motion.  Ambulating without abnormality Nontender to palpation of spine, nontender to palpation bilateral lumbar,  thoracic and cervical musculature.  Neurological: He is alert and oriented to person, place, and time.  Skin: Skin is warm and dry.  Psychiatric: He has a normal mood and affect.  Nursing note and vitals reviewed.    UC Treatments / Results  Labs (all labs ordered are listed, but only abnormal results are displayed) Labs Reviewed - No data to display  EKG None  Radiology No results found.  Procedures Procedures (including critical care  time)  Medications Ordered in UC Medications - No data to display  Initial Impression / Assessment and Plan / UC Course  I have reviewed the triage vital signs and the nursing notes.  Pertinent labs & imaging results that were available during my care of the patient were reviewed by me and considered in my medical decision making (see chart for details).     Patient with chronic pain, likely dependent on opioids, patient has previously had pain relief with spinal injections, but he is temporarily not gotten these because he did not have the money, but recently is back on Medicaid.  Initially offered Toradol injection, patient declined stating this causes his mania.  Discussed with patient providing with 1 weeks worth no more, to stretch these out, continuing with plan to follow-up with pain management through his PCP, recontacting his spine doctor to consider spinal injections for pain management as alternative.Discussed strict return precautions. Patient verbalized understanding and is agreeable with plan.  Final Clinical Impressions(s) / UC Diagnoses   Final diagnoses:  Chronic pain syndrome  Medication refill     Discharge Instructions     Please continue to try and get set up with pain management, consider following up with your previous back doctor for spinal injections for your pain  I have refilled your Tramadol for 1 week.  Please follow up if developing worsening pain   ED Prescriptions    Medication Sig Dispense Auth.  Provider   traMADol (ULTRAM) 50 MG tablet Take 2 tablets (100 mg total) by mouth 3 (three) times daily for 7 days. 42 tablet Phil Michels, [place] C, PA-C     Controlled Substance Prescriptions [place] Controlled Substance Registry consulted? Yes, I have consulted the [place] Controlled Substances Registry for this patient, and feel the risk/benefit ratio today is favorable for proceeding with this prescription for a controlled substance.  Patient's story lines up with registry, getting 180 tablets monthly up until this past month.   Janith Lima, PA-C 01/02/18 4827    Janith Lima, PA-C 01/02/18 [phone]

## 2018-01-09 ENCOUNTER — Ambulatory Visit (HOSPITAL_COMMUNITY)
Admission: EM | Admit: 2018-01-09 | Discharge: 2018-01-09 | Disposition: A | Discharge status: Home / Self Care | Payer: Medicare Other | Attending: Family Medicine | Admitting: Family Medicine

## 2018-01-09 ENCOUNTER — Encounter (HOSPITAL_COMMUNITY): Payer: Self-pay | Admitting: Emergency Medicine

## 2018-01-09 ENCOUNTER — Other Ambulatory Visit: Payer: Self-pay

## 2018-01-09 DIAGNOSIS — M545 Low back pain: Secondary | ICD-10-CM

## 2018-01-09 DIAGNOSIS — G8929 Other chronic pain: Secondary | ICD-10-CM | POA: Diagnosis not present

## 2018-01-09 NOTE — ED Triage Notes (Signed)
Chronic back pain.  Patient is established with pain clinic per patient.  Currently patient is out of his medicine

## 2018-01-09 NOTE — Discharge Instructions (Signed)
Please follow up your pain management doctor  Please try heat or ice on the area  Please try to perform regular stretching and exercise  Please try massage  Please Aspercreme with lidocaine

## 2018-01-09 NOTE — ED Provider Notes (Signed)
[place]    CSN: [id number] Arrival date & time: 01/09/18  1833     History   Chief Complaint Chief Complaint  Patient presents with  . Back Pain    HPI Joshua [name] is a 62 y.o. male.   He is presenting with acute on chronic low back pain.  He has chronic pain and is transitioning to a pain clinic.  He has an appointment set up for September 24.  He has been seen by them and has had his initial visit.  They have not prescribed him any tramadol as of yet.  He was seen here on 9/8.  He was prescribed tramadol for 1 week.  He is reporting his same symptoms.  He is unsure if getting pain medicine here will break his contract with his pain center.  He denies any changes in his regular pain.  He reports having previously fractured his spine and has chronic pain because of it.  HPI  Past Medical History:  Diagnosis Date  . Back pain   . Bipolar disorder ([place])   . Cannabis use disorder, moderate, dependence ([place])   . Chronic pain   . Generalized anxiety disorder   . Hypertension   . Schizoaffective disorder [facility])     Patient Active Problem List   Diagnosis Date Noted  . Blister of right foot 10/29/2016  . Chronic pain syndrome 10/14/2016  . Osteoarthritis 10/14/2016  . T2DM (type 2 diabetes mellitus) ([place]) 10/14/2016  . HTN (hypertension) 10/14/2016  . Schizoaffective disorder, bipolar type ([place]) 05/10/2016  . Generalized anxiety disorder 05/04/2016    Past Surgical History:  Procedure Laterality Date  . APPENDECTOMY  1970       Home Medications    Prior to Admission medications   Medication Sig Start Date End Date Taking? Authorizing Provider  clonazePAM (KLONOPIN) 0.5 MG tablet Take 1 tablet (0.5 mg total) by mouth at bedtime. 08/19/17   Robyn Haber, MD  divalproex (DEPAKOTE) 500 MG DR tablet Take 1 tablet (500 mg total) by mouth 3 (three) times daily. 11/09/16   Clent Demark, PA-C  doxycycline (VIBRAMYCIN) 100 MG capsule Take 1 capsule  (100 mg total) by mouth 2 (two) times daily. 10/12/17   Wurst, [place], PA-C  hydrochlorothiazide (HYDRODIURIL) 25 MG tablet Take 1 tablet (25 mg total) by mouth every morning. For high blood pressure 10/14/16   Bacigalupo, Dionne Bucy, MD  ibuprofen (ADVIL,MOTRIN) 200 MG tablet Take 200 mg by mouth every 6 (six) hours as needed.    [provider]  lisinopril-hydrochlorothiazide (PRINZIDE,ZESTORETIC) 10-12.5 MG tablet Take 1 tablet by mouth daily.    [provider]  tamsulosin (FLOMAX) 0.4 MG CAPS capsule Take 1 capsule (0.4 mg total) by mouth daily. 09/19/17   Wieters, Hallie C, PA-C  VENTOLIN HFA 108 (90 Base) MCG/ACT inhaler Inhale 2 puffs into the lungs every 4 (four) hours as needed. 10/14/16   Virginia Crews, MD    Family History Family History  Problem Relation Age of Onset  . Diabetes Mother   . Cancer Mother        unsure what type  . Heart disease Mother   . Hypertension Mother   . Dementia Father   . Diabetes Brother   . Dementia Maternal Grandfather   . Cancer Paternal Grandmother 24       breast  . Mental illness Neg Hx     Social History Social History   Tobacco Use  . Smoking status:  Current Every Day Smoker    Packs/day: 2.00    Types: Cigarettes  . Smokeless tobacco: Former Network engineer Use Topics  . Alcohol use: Not Currently  . Drug use: Yes    Types: Marijuana    Comment: "have changed to hemp now" 09/17/17     Allergies   Imitrex [sumatriptan]   Review of Systems Review of Systems  Constitutional: Negative for fever.  HENT: Negative for congestion.   Respiratory: Negative for cough.   Cardiovascular: Negative for chest pain.  Gastrointestinal: Negative for abdominal pain.  Musculoskeletal: Positive for back pain and myalgias.  Skin: Negative for color change.  Neurological: Positive for headaches. Negative for weakness.  Hematological: Negative for adenopathy.     Physical Exam Triage Vital Signs ED Triage Vitals    Enc Vitals Group     BP 01/09/18 1926 133/79     Pulse Rate 01/09/18 1926 75     Resp 01/09/18 1926 18     Temp 01/09/18 1926 97.9 F (36.6 C)     Temp Source 01/09/18 1926 Oral     SpO2 01/09/18 1926 98 %     Weight --      Height --      Head Circumference --      Peak Flow --      Pain Score 01/09/18 1924 5     Pain Loc --      Pain Edu? --      Excl. in [place]? --    No data found.  Updated Vital Signs BP 133/79 (BP Location: Right Arm)   Pulse 75   Temp 97.9 F (36.6 C) (Oral)   Resp 18   SpO2 98%   Visual Acuity Right Eye Distance:   Left Eye Distance:   Bilateral Distance:    Right Eye Near:   Left Eye Near:    Bilateral Near:     Physical Exam  Gen: NAD, alert, cooperative with exam,  ENT: normal lips, normal nasal mucosa,  Eye: normal EOM, normal conjunctiva and lids CV:  no edema, +2 pedal pulses   Resp: no accessory muscle use, non-labored,  Skin: no rashes, no areas of induration  Neuro: normal tone, normal sensation to touch Psych:  normal insight, alert and oriented MSK:  Back:  No specific area tender to palpation in the lumbar spine.   No midline spine tenderness. Normal internal/external rotation of the hips. Normal strength resistance with hip flexion, knee flexion extension, plantar and dorsiflexion. Negative straight leg raise bilaterally. Neurovascularly intact  UC Treatments / Results  Labs (all labs ordered are listed, but only abnormal results are displayed) Labs Reviewed - No data to display  EKG None  Radiology No results found.  Procedures Procedures (including critical care time)  Medications Ordered in UC Medications - No data to display  Initial Impression / Assessment and Plan / UC Course  I have reviewed the triage vital signs and the nursing notes.  Pertinent labs & imaging results that were available during my care of the patient were reviewed by me and considered in my medical decision making (see chart for  details).     Joshua [name] is presenting with his chronic pain and opioid dependency.  Denies any changes in his chronic pain.  He reports having established with the pain clinic and has his appointment for September 24.  He is also already established but has not been prescribed any medications.  He reports needing tramadol in order to  get him through his first appointment.  Discussed with him today that he needs to call his pain management doctor and make sure it is okay for him to receive tramadol from another provider.  If he calls back tomorrow and this is allowed, then he can have pain medicine and I will send it in.  Final Clinical Impressions(s) / UC Diagnoses   Final diagnoses:  Chronic bilateral low back pain without sciatica     Discharge Instructions     Please follow up your pain management doctor  Please try heat or ice on the area  Please try to perform regular stretching and exercise  Please try massage  Please Aspercreme with lidocaine    ED Prescriptions    None     Controlled Substance Prescriptions [place] Controlled Substance Registry consulted? Yes, I have consulted the [place] Controlled Substances Registry for this patient, and feel the risk/benefit ratio today is favorable for proceeding with this prescription for a controlled substance.   Rosemarie Ax, MD 01/09/18 2118

## 2018-01-10 ENCOUNTER — Telehealth (HOSPITAL_COMMUNITY): Payer: Self-pay

## 2018-01-10 ENCOUNTER — Ambulatory Visit (HOSPITAL_COMMUNITY)
Admission: EM | Admit: 2018-01-10 | Discharge: 2018-01-10 | Disposition: A | Discharge status: Home / Self Care | Payer: Medicare Other

## 2018-01-10 NOTE — ED Notes (Signed)
Pt presents demanding pain medication being verbally abusive. Per provider note from last night he cannot have pain medication unless a letter is written by his pain management doctor. Dr. Carlean Jews at bedside with patient along with security.  Dr. Patterson Hammersmith reinforced needing a letter from his doctor but until then he cannot have narcotics. Patient began cussing and was escorted out of the Urgent Care doors. Per Security if he comes back with a letter we are to call them. Clinical staff made aware.

## 2018-01-11 DIAGNOSIS — G894 Chronic pain syndrome: Secondary | ICD-10-CM | POA: Diagnosis not present

## 2018-01-11 DIAGNOSIS — M5136 Other intervertebral disc degeneration, lumbar region: Secondary | ICD-10-CM | POA: Diagnosis not present

## 2018-01-11 DIAGNOSIS — Z79891 Long term (current) use of opiate analgesic: Secondary | ICD-10-CM | POA: Diagnosis not present

## 2018-01-11 DIAGNOSIS — Z5181 Encounter for therapeutic drug level monitoring: Secondary | ICD-10-CM | POA: Diagnosis not present

## 2018-02-07 ENCOUNTER — Ambulatory Visit: Payer: Medicare Other | Admitting: Neurology

## 2018-02-07 ENCOUNTER — Encounter

## 2018-02-07 DIAGNOSIS — M5136 Other intervertebral disc degeneration, lumbar region: Secondary | ICD-10-CM | POA: Diagnosis not present

## 2018-02-07 DIAGNOSIS — G894 Chronic pain syndrome: Secondary | ICD-10-CM | POA: Diagnosis not present

## 2018-02-07 DIAGNOSIS — Z79891 Long term (current) use of opiate analgesic: Secondary | ICD-10-CM | POA: Diagnosis not present

## 2018-02-07 DIAGNOSIS — Z5181 Encounter for therapeutic drug level monitoring: Secondary | ICD-10-CM | POA: Diagnosis not present

## 2018-02-21 DIAGNOSIS — Z79891 Long term (current) use of opiate analgesic: Secondary | ICD-10-CM | POA: Diagnosis not present

## 2018-02-21 DIAGNOSIS — M5136 Other intervertebral disc degeneration, lumbar region: Secondary | ICD-10-CM | POA: Diagnosis not present

## 2018-02-21 DIAGNOSIS — Z5181 Encounter for therapeutic drug level monitoring: Secondary | ICD-10-CM | POA: Diagnosis not present

## 2018-02-21 DIAGNOSIS — G894 Chronic pain syndrome: Secondary | ICD-10-CM | POA: Diagnosis not present

## 2018-02-24 ENCOUNTER — Emergency Department (HOSPITAL_COMMUNITY)
Admission: EM | Admit: 2018-02-24 | Discharge: 2018-02-24 | Disposition: A | Discharge status: Home / Self Care | Payer: Medicare Other | Attending: Emergency Medicine | Admitting: Emergency Medicine

## 2018-02-24 ENCOUNTER — Encounter (HOSPITAL_COMMUNITY): Payer: Self-pay | Admitting: Emergency Medicine

## 2018-02-24 DIAGNOSIS — R69 Illness, unspecified: Secondary | ICD-10-CM | POA: Diagnosis not present

## 2018-02-24 DIAGNOSIS — Z5321 Procedure and treatment not carried out due to patient leaving prior to being seen by health care provider: Secondary | ICD-10-CM | POA: Diagnosis not present

## 2018-02-24 LAB — COMPREHENSIVE METABOLIC PANEL
ALT: 17 U/L (ref 0–44)
AST: 23 U/L (ref 15–41)
Albumin: 3.7 g/dL (ref 3.5–5.0)
Alkaline Phosphatase: 69 U/L (ref 38–126)
Anion gap: 9 (ref 5–15)
BILIRUBIN TOTAL: 0.7 mg/dL (ref 0.3–1.2)
BUN: 17 mg/dL (ref 6–20)
CHLORIDE: 94 mmol/L — AB (ref 98–111)
CO2: 23 mmol/L (ref 22–32)
CREATININE: 1.31 mg/dL — AB (ref 0.61–1.24)
Calcium: 9.5 mg/dL (ref 8.9–10.3)
GFR calc non Af Amer: 58 mL/min — ABNORMAL LOW (ref >60)
Glucose, Bld: 323 mg/dL — ABNORMAL HIGH (ref 70–99)
Potassium: 4.6 mmol/L (ref 3.5–5.1)
Sodium: 126 mmol/L — ABNORMAL LOW (ref 135–145)
TOTAL PROTEIN: 6.4 g/dL — AB (ref 6.5–8.1)

## 2018-02-24 LAB — CBC
HCT: 46.6 % (ref 39.0–52.0)
HEMOGLOBIN: 15.7 g/dL (ref 13.0–17.0)
MCH: 31.8 pg (ref 26.0–34.0)
MCHC: 33.7 g/dL (ref 30.0–36.0)
MCV: 94.5 fL (ref 80.0–100.0)
PLATELETS: 145 10*3/uL — AB (ref 150–400)
RBC: 4.93 MIL/uL (ref 4.22–5.81)
RDW: 13.2 % (ref 11.5–15.5)
WBC: 7.6 10*3/uL (ref 4.0–10.5)
nRBC: 0 % (ref 0.0–0.2)

## 2018-02-24 LAB — ETHANOL

## 2018-02-24 LAB — RAPID URINE DRUG SCREEN, HOSP PERFORMED
Amphetamines: NOT DETECTED
Barbiturates: NOT DETECTED
Benzodiazepines: NOT DETECTED
COCAINE: NOT DETECTED
OPIATES: NOT DETECTED
TETRAHYDROCANNABINOL: POSITIVE — AB

## 2018-02-24 NOTE — ED Triage Notes (Signed)
Pt to ER for help with detox. Reports marijuana use daily until yesterday and thinks someone is lacing his stuff with other drugs. Requesting UDS and help with resources.

## 2018-02-24 NOTE — ED Notes (Signed)
Pt

## 2018-02-24 NOTE — ED Notes (Signed)
PT came up to nurse and staff and told us he was going to call 911 to get back quicker. Nurse told patient that there were very sick people coming in and needed to be patient. He has been going in and out saying he is leaving. He has been very rude and demanding. Pt came back up and said he was leaving. Asked him to stay but he said he needed to go.

## 2018-02-24 NOTE — ED Notes (Signed)
Denies ETOH abuse. Denies SI/HI.

## 2018-02-25 ENCOUNTER — Emergency Department (HOSPITAL_COMMUNITY): Payer: Medicare Other

## 2018-02-25 ENCOUNTER — Encounter (HOSPITAL_COMMUNITY): Payer: Self-pay | Admitting: Emergency Medicine

## 2018-02-25 ENCOUNTER — Emergency Department (HOSPITAL_COMMUNITY)
Admission: EM | Admit: 2018-02-25 | Discharge: 2018-02-25 | Disposition: A | Discharge status: Home / Self Care | Payer: Medicare Other | Attending: Emergency Medicine | Admitting: Emergency Medicine

## 2018-02-25 ENCOUNTER — Other Ambulatory Visit: Payer: Self-pay

## 2018-02-25 DIAGNOSIS — E119 Type 2 diabetes mellitus without complications: Secondary | ICD-10-CM | POA: Insufficient documentation

## 2018-02-25 DIAGNOSIS — Z79899 Other long term (current) drug therapy: Secondary | ICD-10-CM | POA: Insufficient documentation

## 2018-02-25 DIAGNOSIS — I10 Essential (primary) hypertension: Secondary | ICD-10-CM | POA: Insufficient documentation

## 2018-02-25 DIAGNOSIS — F1220 Cannabis dependence, uncomplicated: Secondary | ICD-10-CM | POA: Insufficient documentation

## 2018-02-25 DIAGNOSIS — R062 Wheezing: Secondary | ICD-10-CM | POA: Diagnosis not present

## 2018-02-25 DIAGNOSIS — G8929 Other chronic pain: Secondary | ICD-10-CM | POA: Diagnosis not present

## 2018-02-25 DIAGNOSIS — R52 Pain, unspecified: Secondary | ICD-10-CM

## 2018-02-25 DIAGNOSIS — R0981 Nasal congestion: Secondary | ICD-10-CM | POA: Diagnosis not present

## 2018-02-25 DIAGNOSIS — F17210 Nicotine dependence, cigarettes, uncomplicated: Secondary | ICD-10-CM | POA: Diagnosis not present

## 2018-02-25 DIAGNOSIS — G520 Disorders of olfactory nerve: Secondary | ICD-10-CM | POA: Diagnosis not present

## 2018-02-25 LAB — CBC
HEMATOCRIT: 47.9 % (ref 39.0–52.0)
HEMOGLOBIN: 15.6 g/dL (ref 13.0–17.0)
MCH: 31.2 pg (ref 26.0–34.0)
MCHC: 32.6 g/dL (ref 30.0–36.0)
MCV: 95.8 fL (ref 80.0–100.0)
Platelets: 166 10*3/uL (ref 150–400)
RBC: 5 MIL/uL (ref 4.22–5.81)
RDW: 13.7 % (ref 11.5–15.5)
WBC: 7.3 10*3/uL (ref 4.0–10.5)
nRBC: 0 % (ref 0.0–0.2)

## 2018-02-25 LAB — COMPREHENSIVE METABOLIC PANEL
ALK PHOS: 75 U/L (ref 38–126)
ALT: 19 U/L (ref 0–44)
AST: 17 U/L (ref 15–41)
Albumin: 3.9 g/dL (ref 3.5–5.0)
Anion gap: 8 (ref 5–15)
BUN: 21 mg/dL — AB (ref 6–20)
CALCIUM: 9.3 mg/dL (ref 8.9–10.3)
CO2: 25 mmol/L (ref 22–32)
CREATININE: 1.13 mg/dL (ref 0.61–1.24)
Chloride: 101 mmol/L (ref 98–111)
GFR calc Af Amer: >60 mL/min (ref >60)
GFR calc non Af Amer: >60 mL/min (ref >60)
GLUCOSE: 201 mg/dL — AB (ref 70–99)
Potassium: 4.7 mmol/L (ref 3.5–5.1)
SODIUM: 134 mmol/L — AB (ref 135–145)
Total Bilirubin: 0.7 mg/dL (ref 0.3–1.2)
Total Protein: 7 g/dL (ref 6.5–8.1)

## 2018-02-25 LAB — PROTIME-INR
INR: 0.95
PROTHROMBIN TIME: 12.6 s (ref 11.4–15.2)

## 2018-02-25 LAB — RAPID URINE DRUG SCREEN, HOSP PERFORMED
Amphetamines: NOT DETECTED
BENZODIAZEPINES: NOT DETECTED
Barbiturates: NOT DETECTED
Cocaine: NOT DETECTED
Opiates: NOT DETECTED
Tetrahydrocannabinol: POSITIVE — AB

## 2018-02-25 LAB — ETHANOL: Alcohol, Ethyl (B): <10 mg/dL (ref <10)

## 2018-02-25 MED ORDER — ALBUTEROL SULFATE (2.5 MG/3ML) 0.083% IN NEBU: 2.5000 mg | INHALATION_SOLUTION | Freq: Four times a day (QID) | RESPIRATORY_TRACT | 12 refills | Status: DC | PRN

## 2018-02-25 MED ORDER — TRAMADOL HCL 50 MG PO TABS
50.0000 mg | ORAL_TABLET | Freq: Once | ORAL | Status: AC
  Administered 2018-02-25: 50 mg via ORAL
  Filled 2018-02-25: qty 1

## 2018-02-25 MED ORDER — TRAMADOL HCL 50 MG PO TABS: 50.0000 mg | ORAL_TABLET | Freq: Four times a day (QID) | ORAL | 0 refills | Status: DC | PRN

## 2018-02-25 NOTE — ED Triage Notes (Signed)
Pt request help because "I am sick"  Has smoked marijuana for years None in three days  "I'm not crazy" I just want something to help me get off marijuana- I don't want to go anywhere I just something to help me ad go home"  Mental Health followed in [place]- "had an appt the other day but I was sick and could not go"  " I heard there was some marijuana pills to help you get off"   Seen in [facility] yesterday threatened to leave then call 911 to be seen sooner

## 2018-02-25 NOTE — ED Provider Notes (Signed)
[facility] EMERGENCY DEPARTMENT Provider Note   CSN: [id number] Arrival date & time: 02/25/18  1354     History   Chief Complaint Chief Complaint  Patient presents with  . Medical Clearance    HPI Joshua [name] is a 62 y.o. male.  HPI Patient with multiple medical issues including hypertension, schizoaffective disorder and cannabis dependency presents with concern of general discomfort, worsening of his chronic pain. He does the last smoked marijuana 3 days ago, but has been taking all of his prescribed medication regularly including antihypertensive, antipsychotic. He notes that over the past few days, he has been feeling more of his diffuse back pain, which is chronic, typically improves with tramadol, but he no longer has access to this medicine. He denies suicidal or homicidal ideation, but does state that he is feeling more anxious than usual. He denies new dyspnea, acknowledges ongoing cough, states that this is typical for him, not clearly better or worse with anything in particular. No report any fever, no chills. Patient went to our affiliated facility yesterday, left due to perceived long wait time.  Past Medical History:  Diagnosis Date  . Back pain   . Bipolar disorder ([place])   . Cannabis use disorder, moderate, dependence ([place])   . Chronic pain   . Generalized anxiety disorder   . Hypertension   . Schizoaffective disorder [facility])     Patient Active Problem List   Diagnosis Date Noted  . Blister of right foot 10/29/2016  . Chronic pain syndrome 10/14/2016  . Osteoarthritis 10/14/2016  . T2DM (type 2 diabetes mellitus) ([place]) 10/14/2016  . HTN (hypertension) 10/14/2016  . Schizoaffective disorder, bipolar type ([place]) 05/10/2016  . Generalized anxiety disorder 05/04/2016    Past Surgical History:  Procedure Laterality Date  . APPENDECTOMY  1970        Home Medications    Prior to Admission medications   Medication Sig Start Date End Date Taking?  Authorizing Provider  clonazePAM (KLONOPIN) 0.5 MG tablet Take 1 tablet (0.5 mg total) by mouth at bedtime. 08/19/17   Robyn Haber, MD  divalproex (DEPAKOTE) 500 MG DR tablet Take 1 tablet (500 mg total) by mouth 3 (three) times daily. 11/09/16   Clent Demark, PA-C  doxycycline (VIBRAMYCIN) 100 MG capsule Take 1 capsule (100 mg total) by mouth 2 (two) times daily. 10/12/17   Wurst, [place], PA-C  hydrochlorothiazide (HYDRODIURIL) 25 MG tablet Take 1 tablet (25 mg total) by mouth every morning. For high blood pressure 10/14/16   Bacigalupo, Dionne Bucy, MD  ibuprofen (ADVIL,MOTRIN) 200 MG tablet Take 200 mg by mouth every 6 (six) hours as needed.    [provider]  lisinopril-hydrochlorothiazide (PRINZIDE,ZESTORETIC) 10-12.5 MG tablet Take 1 tablet by mouth daily.    [provider]  tamsulosin (FLOMAX) 0.4 MG CAPS capsule Take 1 capsule (0.4 mg total) by mouth daily. 09/19/17   Wieters, Hallie C, PA-C  VENTOLIN HFA 108 (90 Base) MCG/ACT inhaler Inhale 2 puffs into the lungs every 4 (four) hours as needed. 10/14/16   Virginia Crews, MD    Family History Family History  Problem Relation Age of Onset  . Diabetes Mother   . Cancer Mother        unsure what type  . Heart disease Mother   . Hypertension Mother   . Dementia Father   . Diabetes Brother   . Dementia Maternal Grandfather   . Cancer Paternal Grandmother 75       breast  .  Mental illness Neg Hx     Social History Social History   Tobacco Use  . Smoking status: Current Every Day Smoker    Packs/day: 2.00    Types: Cigarettes  . Smokeless tobacco: Former Network engineer Use Topics  . Alcohol use: Not Currently  . Drug use: Yes    Types: Marijuana    Comment: "have changed to hemp now" 09/17/17     Allergies   Imitrex [sumatriptan]   Review of Systems Review of Systems  Constitutional:       Per HPI, otherwise negative  HENT:       Per HPI, otherwise negative  Respiratory:       Per  HPI, otherwise negative  Cardiovascular:       Per HPI, otherwise negative  Gastrointestinal: Negative for vomiting.  Endocrine:       Negative aside from HPI  Genitourinary:       Neg aside from HPI   Musculoskeletal:       Per HPI, otherwise negative  Skin: Negative.   Neurological: Negative for syncope.  Psychiatric/Behavioral: Positive for dysphoric mood. The patient is nervous/anxious.      Physical Exam Updated Vital Signs BP (!) 123/95 (BP Location: Right Arm)   Pulse (!) 107   Temp 98.1 F (36.7 C)   Resp 18   Ht 5\' 9"  (1.753 m)   Wt 64 kg   SpO2 98%   BMI 20.82 kg/m   Physical Exam  Constitutional: He is oriented to person, place, and time. He appears well-developed. No distress.  HENT:  Head: Normocephalic and atraumatic.  Eyes: Conjunctivae and EOM are normal.  Cardiovascular: Normal rate and regular rhythm.  Pulmonary/Chest: No stridor. Tachypnea noted. No respiratory distress. He has wheezes.  Abdominal: He exhibits no distension.  Musculoskeletal: He exhibits no edema.  Neurological: He is alert and oriented to person, place, and time.  Skin: Skin is warm and dry.  Psychiatric: His mood appears anxious.  Nursing note and vitals reviewed.    ED Treatments / Results  Labs (all labs ordered are listed, but only abnormal results are displayed) Labs Reviewed  COMPREHENSIVE METABOLIC PANEL - Abnormal; Notable for the following components:      Result Value   Sodium 134 (*)    Glucose, Bld 201 (*)    BUN 21 (*)    All other components within normal limits  RAPID URINE DRUG SCREEN, HOSP PERFORMED - Abnormal; Notable for the following components:   Tetrahydrocannabinol POSITIVE (*)    All other components within normal limits  ETHANOL  CBC  PROTIME-INR    EKG None  Radiology Dg Chest 2 View  Result Date: 02/25/2018 CLINICAL DATA:  Congestion.  Smoking. EXAM: CHEST - 2 VIEW COMPARISON:  Two-view chest x-ray 12/24/2016 FINDINGS: Lungs are mildly  hyperexpanded. No focal airspace disease is present. There is no edema or effusion. Degenerative changes of the thoracic spine are stable. IMPRESSION: No active cardiopulmonary disease. Electronically Signed   By: San Morelle M.D.   On: 02/25/2018 15:39    Procedures Procedures (including critical care time)  Medications Ordered in ED Medications  traMADol (ULTRAM) tablet 50 mg (50 mg Oral Given 02/25/18 1506)     Initial Impression / Assessment and Plan / ED Course  I have reviewed the triage vital signs and the nursing notes.  Pertinent labs & imaging results that were available during my care of the patient were reviewed by me and considered in my medical  decision making (see chart for details).     4:13 PM Patient is awake, alert, in no distress, sitting upright, speaking clearly. Labs reviewed with him, no notable findings, no evidence for acute withdrawal, no evidence for decompensation, pneumonia. Patient is improved with bronchodilator. We discussed importance of following up with outpatient physicians for his chronic pain, and for assistance with marijuana cessation. With reassuring findings, improvement here he was discharged in stable condition.  Final Clinical Impressions(s) / ED Diagnoses  Wheezing Acute on chronic pain   Carmin Muskrat, MD 02/25/18 1614

## 2018-02-25 NOTE — ED Notes (Addendum)
Pt denies SI, HI, A/V/T hallucinations States he is from [place], but came here due to lack of care at ED yesterday  Reports that he has been referred to pain management, Dr Primus Bravo, but he will not prescribe meds as long as he uses marijuana and that he needs a pill his son told him about to detox  He reports being followed for mental health in [place], but when has asked regarding "detox" states that  They will not do anything.

## 2018-02-25 NOTE — Discharge Instructions (Signed)
As discussed, your evaluation today has been largely reassuring.  But, it is important that you monitor your condition carefully, and do not hesitate to return to the ED if you develop new, or concerning changes in your condition. ? ?Otherwise, please follow-up with your physician for appropriate ongoing care. ? ?

## 2018-03-03 DIAGNOSIS — M5136 Other intervertebral disc degeneration, lumbar region: Secondary | ICD-10-CM | POA: Diagnosis not present

## 2018-03-03 DIAGNOSIS — Z79891 Long term (current) use of opiate analgesic: Secondary | ICD-10-CM | POA: Diagnosis not present

## 2018-03-03 DIAGNOSIS — G894 Chronic pain syndrome: Secondary | ICD-10-CM | POA: Diagnosis not present

## 2018-03-03 DIAGNOSIS — Z5181 Encounter for therapeutic drug level monitoring: Secondary | ICD-10-CM | POA: Diagnosis not present

## 2018-03-23 IMAGING — CR DG CHEST 2V
2 series · 2 of 2 positions shown · non-contrast
Comparison: None.

CLINICAL DATA: Acute mental status change

EXAM:
CHEST  2 VIEW

[w chest pa]
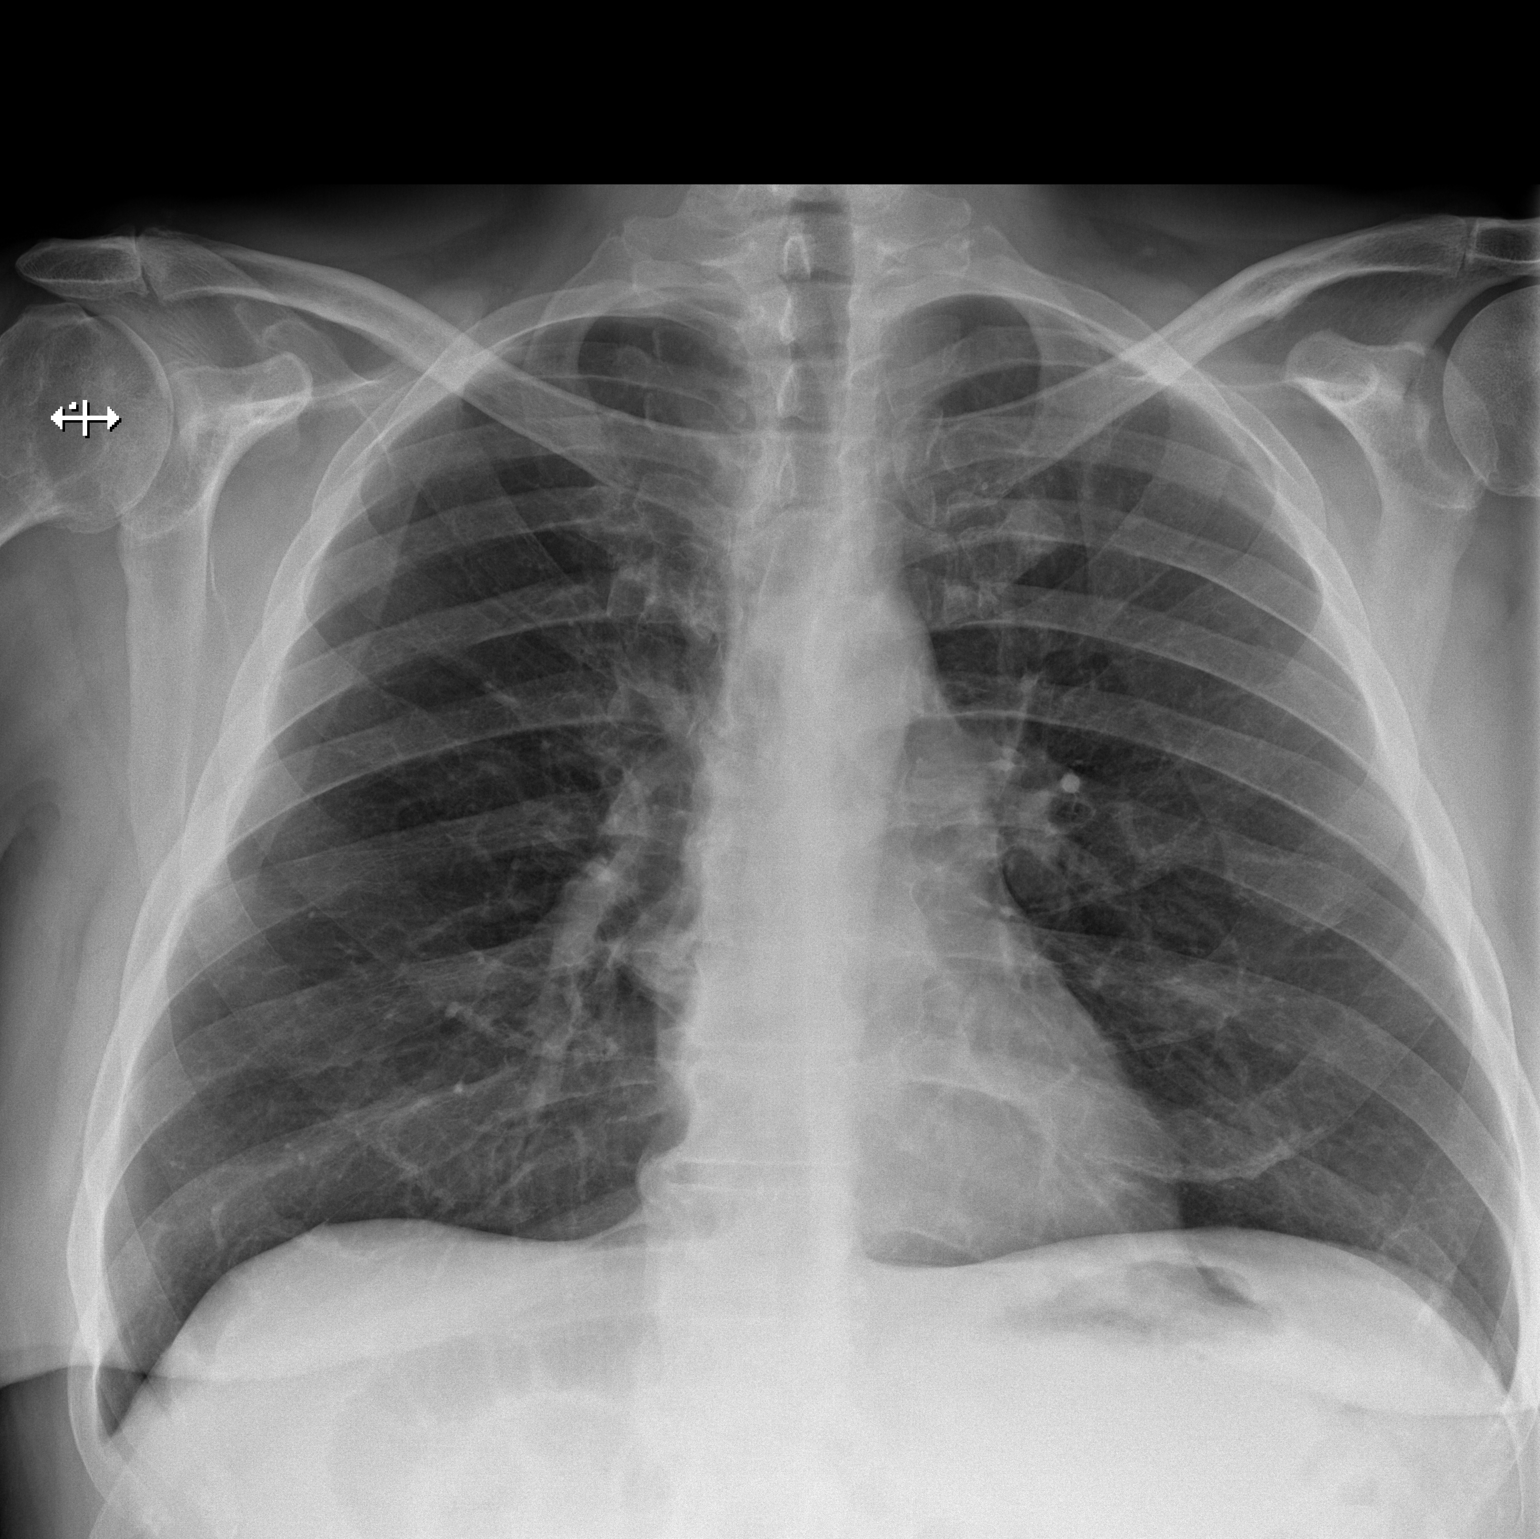

[w chest lat]
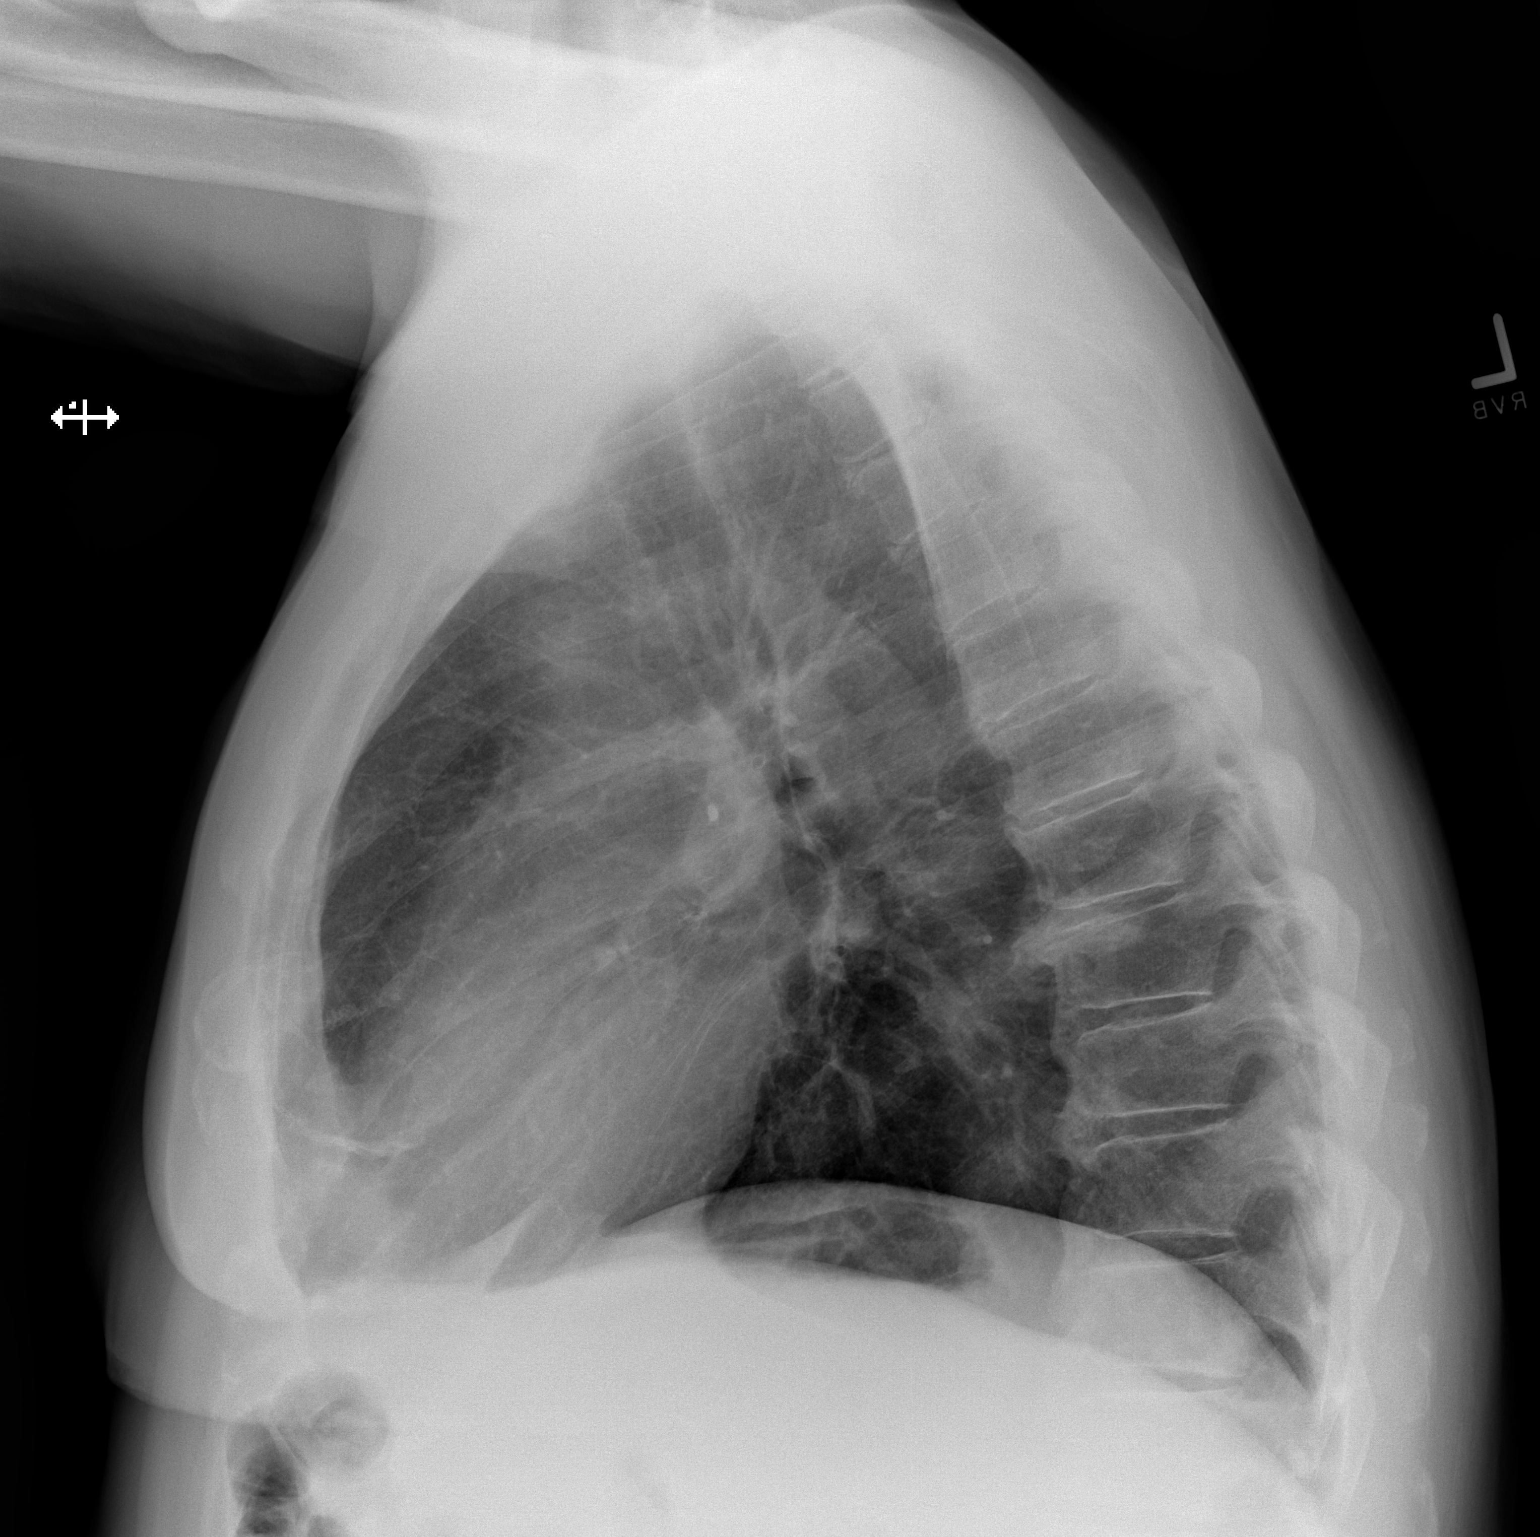

[2 of 2 positions shown; findings below may reference images not displayed]

FINDINGS: The heart size and mediastinal contours are within normal limits.
Both lungs are clear. The visualized skeletal structures are
unremarkable.
IMPRESSION: No active cardiopulmonary disease.

## 2018-04-05 DIAGNOSIS — Z5181 Encounter for therapeutic drug level monitoring: Secondary | ICD-10-CM | POA: Diagnosis not present

## 2018-04-05 DIAGNOSIS — M5136 Other intervertebral disc degeneration, lumbar region: Secondary | ICD-10-CM | POA: Diagnosis not present

## 2018-04-05 DIAGNOSIS — G894 Chronic pain syndrome: Secondary | ICD-10-CM | POA: Diagnosis not present

## 2018-04-05 DIAGNOSIS — Z79891 Long term (current) use of opiate analgesic: Secondary | ICD-10-CM | POA: Diagnosis not present

## 2018-05-03 DIAGNOSIS — Z79891 Long term (current) use of opiate analgesic: Secondary | ICD-10-CM | POA: Diagnosis not present

## 2018-05-03 DIAGNOSIS — M5136 Other intervertebral disc degeneration, lumbar region: Secondary | ICD-10-CM | POA: Diagnosis not present

## 2018-05-03 DIAGNOSIS — Z5181 Encounter for therapeutic drug level monitoring: Secondary | ICD-10-CM | POA: Diagnosis not present

## 2018-05-03 DIAGNOSIS — G894 Chronic pain syndrome: Secondary | ICD-10-CM | POA: Diagnosis not present

## 2018-07-02 ENCOUNTER — Encounter (HOSPITAL_COMMUNITY): Payer: Self-pay | Admitting: Emergency Medicine

## 2018-07-02 ENCOUNTER — Emergency Department (HOSPITAL_COMMUNITY): Payer: Medicare Other

## 2018-07-02 ENCOUNTER — Emergency Department (HOSPITAL_COMMUNITY)
Admission: EM | Admit: 2018-07-02 | Discharge: 2018-07-02 | Disposition: A | Discharge status: Home / Self Care | Payer: Medicare Other | Attending: Emergency Medicine | Admitting: Emergency Medicine

## 2018-07-02 ENCOUNTER — Other Ambulatory Visit: Payer: Self-pay

## 2018-07-02 ENCOUNTER — Emergency Department (HOSPITAL_COMMUNITY)
Admission: EM | Admit: 2018-07-02 | Discharge: 2018-07-02 | Disposition: A | Discharge status: Home / Self Care | Payer: Medicare Other | Source: Home / Self Care | Attending: Emergency Medicine | Admitting: Emergency Medicine

## 2018-07-02 DIAGNOSIS — Z5321 Procedure and treatment not carried out due to patient leaving prior to being seen by health care provider: Secondary | ICD-10-CM | POA: Insufficient documentation

## 2018-07-02 DIAGNOSIS — I10 Essential (primary) hypertension: Secondary | ICD-10-CM | POA: Diagnosis not present

## 2018-07-02 DIAGNOSIS — Z765 Malingerer [conscious simulation]: Secondary | ICD-10-CM | POA: Insufficient documentation

## 2018-07-02 DIAGNOSIS — M545 Low back pain, unspecified: Secondary | ICD-10-CM

## 2018-07-02 DIAGNOSIS — G8929 Other chronic pain: Secondary | ICD-10-CM | POA: Diagnosis not present

## 2018-07-02 DIAGNOSIS — E1165 Type 2 diabetes mellitus with hyperglycemia: Secondary | ICD-10-CM | POA: Diagnosis not present

## 2018-07-02 DIAGNOSIS — R739 Hyperglycemia, unspecified: Secondary | ICD-10-CM

## 2018-07-02 DIAGNOSIS — Z9114 Patient's other noncompliance with medication regimen: Secondary | ICD-10-CM | POA: Diagnosis not present

## 2018-07-02 DIAGNOSIS — R0600 Dyspnea, unspecified: Secondary | ICD-10-CM | POA: Diagnosis not present

## 2018-07-02 HISTORY — DX: Headache, unspecified: R51.9

## 2018-07-02 HISTORY — DX: Headache: R51

## 2018-07-02 HISTORY — DX: Type 2 diabetes mellitus without complications: E11.9

## 2018-07-02 LAB — CBC
HCT: 44.7 % (ref 39.0–52.0)
Hemoglobin: 15.2 g/dL (ref 13.0–17.0)
MCH: 32 pg (ref 26.0–34.0)
MCHC: 34 g/dL (ref 30.0–36.0)
MCV: 94.1 fL (ref 80.0–100.0)
NRBC: 0 % (ref 0.0–0.2)
PLATELETS: 157 10*3/uL (ref 150–400)
RBC: 4.75 MIL/uL (ref 4.22–5.81)
RDW: 12.3 % (ref 11.5–15.5)
WBC: 9.5 10*3/uL (ref 4.0–10.5)

## 2018-07-02 LAB — BASIC METABOLIC PANEL
ANION GAP: 10 (ref 5–15)
BUN: 24 mg/dL — ABNORMAL HIGH (ref 6–20)
CALCIUM: 8.2 mg/dL — AB (ref 8.9–10.3)
CO2: 18 mmol/L — ABNORMAL LOW (ref 22–32)
Chloride: 98 mmol/L (ref 98–111)
Creatinine, Ser: 1.13 mg/dL (ref 0.61–1.24)
Glucose, Bld: 524 mg/dL (ref 70–99)
Potassium: 3.6 mmol/L (ref 3.5–5.1)
Sodium: 126 mmol/L — ABNORMAL LOW (ref 135–145)

## 2018-07-02 LAB — URINALYSIS, ROUTINE W REFLEX MICROSCOPIC
Bacteria, UA: NONE SEEN
Bilirubin Urine: NEGATIVE
HGB URINE DIPSTICK: NEGATIVE
Ketones, ur: 5 mg/dL — AB
Leukocytes,Ua: NEGATIVE
Nitrite: NEGATIVE
Protein, ur: NEGATIVE mg/dL
Specific Gravity, Urine: 1.03 (ref 1.005–1.030)
pH: 6 (ref 5.0–8.0)

## 2018-07-02 LAB — CBG MONITORING, ED
GLUCOSE-CAPILLARY: 571 mg/dL — AB (ref 70–99)
GLUCOSE-CAPILLARY: 328 mg/dL — AB (ref 70–99)
GLUCOSE-CAPILLARY: 195 mg/dL — AB (ref 70–99)
Glucose-Capillary: 561 mg/dL (ref 70–99)
Glucose-Capillary: 474 mg/dL — ABNORMAL HIGH (ref 70–99)
Glucose-Capillary: 293 mg/dL — ABNORMAL HIGH (ref 70–99)

## 2018-07-02 MED ORDER — SODIUM CHLORIDE 0.9 % IV BOLUS: 1000.0000 mL | Freq: Once | INTRAVENOUS | Status: DC

## 2018-07-02 MED ORDER — TRAMADOL HCL 50 MG PO TABS
50.0000 mg | ORAL_TABLET | Freq: Once | ORAL | Status: AC
  Administered 2018-07-02: 50 mg via ORAL
  Filled 2018-07-02: qty 1

## 2018-07-02 MED ORDER — POTASSIUM CHLORIDE CRYS ER 20 MEQ PO TBCR
40.0000 meq | EXTENDED_RELEASE_TABLET | Freq: Once | ORAL | Status: AC
  Administered 2018-07-02: 40 meq via ORAL
  Filled 2018-07-02: qty 2

## 2018-07-02 MED ORDER — SODIUM CHLORIDE 0.9 % IV BOLUS
1000.0000 mL | Freq: Once | INTRAVENOUS | Status: AC
  Administered 2018-07-02: 1000 mL via INTRAVENOUS

## 2018-07-02 MED ORDER — INSULIN REGULAR(HUMAN) IN NACL 100-0.9 UT/100ML-% IV SOLN
INTRAVENOUS | Status: DC
  Administered 2018-07-02: 4.1 [IU]/h via INTRAVENOUS
  Filled 2018-07-02: qty 100

## 2018-07-02 MED ORDER — IPRATROPIUM-ALBUTEROL 0.5-2.5 (3) MG/3ML IN SOLN
3.0000 mL | RESPIRATORY_TRACT | Status: DC
  Administered 2018-07-02: 3 mL via RESPIRATORY_TRACT
  Filled 2018-07-02: qty 3

## 2018-07-02 MED ORDER — POTASSIUM CHLORIDE 10 MEQ/100ML IV SOLN
10.0000 meq | Freq: Once | INTRAVENOUS | Status: AC
  Administered 2018-07-02: 10 meq via INTRAVENOUS
  Filled 2018-07-02: qty 100

## 2018-07-02 MED ORDER — SODIUM CHLORIDE 0.9 % IV SOLN
INTRAVENOUS | Status: DC
  Administered 2018-07-02: 19:00:00 via INTRAVENOUS

## 2018-07-02 MED ORDER — LIDOCAINE 5 % EX PTCH
1.0000 | MEDICATED_PATCH | CUTANEOUS | Status: DC
  Filled 2018-07-02: qty 1

## 2018-07-02 MED ORDER — GLIMEPIRIDE 1 MG PO TABS: 1.0000 mg | ORAL_TABLET | Freq: Every day | ORAL | 0 refills | Status: DC

## 2018-07-02 NOTE — ED Notes (Signed)
Date and time results received: 07/02/18 1727 (use smartphrase ".now" to insert current time)  Test: Glucose Critical Value: 524  Name of Provider Notified: Benedetto Goad  Orders Received? Or Actions Taken?: notified Benedetto Goad glucose 524.

## 2018-07-02 NOTE — ED Triage Notes (Signed)
Pt reports 2 weeks ago felt like getting a cold and everything taste terrible and wont go away.  Pt was at [facility] earlier and sugar was in 500s. Pt reports that he is a diabetic but was taken 6 months ago. Been having blurred vision, body pains, thirst and urinary frequency for couple days. Pt reports she was told sugar high this morning, he took medications (PO).

## 2018-07-02 NOTE — ED Provider Notes (Signed)
[facility] EMERGENCY DEPARTMENT Provider Note   CSN: [id number] Arrival date & time: 07/02/18  1251    History   Chief Complaint Chief Complaint  Patient presents with  . Urinary Frequency    HPI Joshua [name] is a 63 y.o. male.     HPI  Pt was seen at 1325. Per pt, c/o gradual onset and worsening of constant generalized weakness for the past 6 to 8 months, worse over the past several weeks. Pt states approximately 6 to 8 months ago he was told by a doctor to "stop taking his DM meds." Pt also decided to stop taking his psych meds, per family at bedside. Pt states he has "lost 10 lbs" over the past 2 weeks, has had increased thirst and urination. Pt did not check his CBG at home, but "just took one of my diabetes medicine" this morning and "felt a little better."  Pt also c/o gradual onset and persistence of constant acute flair of his chronic low back "pain" for the past several months.   Denies any change in his usual chronic pain pattern.  Pain worsens with palpation of the area and body position changes. Pt was supposed to go to Pain Management, but has not. Denies incont/retention of bowel or bladder, no saddle anesthesia, no focal motor weakness, no tingling/numbness in extremities, no fevers, no new/recent injury, no abd pain, no N/V/D, no CP/palpitations, no SOB/cough, no rash, no dysuria/hematuria.       Past Medical History:  Diagnosis Date  . Back pain   . Bipolar disorder ([place])   . Cannabis use disorder, moderate, dependence ([place])   . Chronic pain   . Generalized anxiety disorder   . Headache   . Hypertension   . Schizoaffective disorder [facility])     Patient Active Problem List   Diagnosis Date Noted  . Blister of right foot 10/29/2016  . Chronic pain syndrome 10/14/2016  . Osteoarthritis 10/14/2016  . T2DM (type 2 diabetes mellitus) ([place]) 10/14/2016  . HTN (hypertension) 10/14/2016  . Schizoaffective disorder, bipolar type ([place]) 05/10/2016  . Generalized  anxiety disorder 05/04/2016    Past Surgical History:  Procedure Laterality Date  . APPENDECTOMY  1970        Home Medications    Prior to Admission medications   Medication Sig Start Date End Date Taking? Authorizing Provider  albuterol (PROVENTIL) (2.5 MG/3ML) 0.083% nebulizer solution Take 3 mLs (2.5 mg total) by nebulization every 6 (six) hours as needed for wheezing or shortness of breath. 02/25/18   Carmin Muskrat, MD  amLODipine (NORVASC) 2.5 MG tablet Take 2.5 mg by mouth daily.    [provider]  budesonide-formoterol (SYMBICORT) 160-4.5 MCG/ACT inhaler Inhale 2 puffs into the lungs 2 (two) times daily.    [provider]  divalproex (DEPAKOTE ER) 500 MG 24 hr tablet Take 3 tablets by mouth at bedtime. 12/05/17   [provider]  doxycycline (VIBRAMYCIN) 100 MG capsule Take 1 capsule (100 mg total) by mouth 2 (two) times daily. 10/12/17   Wurst, [place], PA-C  ibuprofen (ADVIL,MOTRIN) 200 MG tablet Take 200 mg by mouth every 6 (six) hours as needed.    [provider]  lisinopril (PRINIVIL,ZESTRIL) 20 MG tablet Take 20 mg by mouth daily.    [provider]  prazosin (MINIPRESS) 1 MG capsule Take 1 mg by mouth daily.    [provider]  risperiDONE (RISPERDAL) 1 MG tablet Take 1 mg by mouth at bedtime.  [provider]  SUMAtriptan (IMITREX) 50 MG tablet TK 1 T PO AOS OF HEADACHE. MAY REPEAT EVERY 2 TO 3 H FOR MAX DOSE OF 3 TS IN A 24 HOUR PERIOD 01/10/18   [provider]  tamsulosin (FLOMAX) 0.4 MG CAPS capsule Take 1 capsule (0.4 mg total) by mouth daily. 09/19/17   Wieters, Hallie C, PA-C  traMADol (ULTRAM) 50 MG tablet Take 1 tablet (50 mg total) by mouth every 6 (six) hours as needed. 02/25/18   Carmin Muskrat, MD  traMADol (ULTRAM) 50 MG tablet Take 50 mg by mouth every 4 (four) hours as needed for severe pain.    [provider]  VENTOLIN HFA 108 (90 Base) MCG/ACT inhaler Inhale 2 puffs into  the lungs every 4 (four) hours as needed. 10/14/16   Virginia Crews, MD    Family History Family History  Problem Relation Age of Onset  . Diabetes Mother   . Cancer Mother        unsure what type  . Heart disease Mother   . Hypertension Mother   . Dementia Father   . Diabetes Brother   . Dementia Maternal Grandfather   . Cancer Paternal Grandmother 67       breast  . Mental illness Neg Hx     Social History Social History   Tobacco Use  . Smoking status: Current Every Day Smoker    Packs/day: 2.00    Types: Cigarettes  . Smokeless tobacco: Never Used  Substance Use Topics  . Alcohol use: Not Currently  . Drug use: Yes    Types: Marijuana    Comment: "have changed to hemp now" 09/17/17     Allergies   Imitrex [sumatriptan]   Review of Systems Review of Systems ROS: Statement: All systems negative except as marked or noted in the HPI; Constitutional: Negative for fever and chills. +increased thirst and urination, weight loss.; ; Eyes: Negative for eye pain, redness and discharge. ; ; ENMT: Negative for ear pain, hoarseness, nasal congestion, sinus pressure and sore throat. ; ; Cardiovascular: Negative for chest pain, palpitations, diaphoresis, dyspnea and peripheral edema. ; ; Respiratory: Negative for cough, wheezing and stridor. ; ; Gastrointestinal: Negative for nausea, vomiting, diarrhea, abdominal pain, blood in stool, hematemesis, jaundice and rectal bleeding. . ; ; Genitourinary: Negative for dysuria, flank pain and hematuria. ; ; Musculoskeletal: +chronic low back pain. Negative for neck pain. Negative for swelling and trauma.; ; Skin: Negative for pruritus, rash, abrasions, blisters, bruising and skin lesion.; ; Neuro: Negative for headache, lightheadedness and neck stiffness. Negative for weakness, altered level of consciousness, altered mental status, extremity weakness, paresthesias, involuntary movement, seizure and syncope.       Physical Exam Updated  Vital Signs BP (!) 154/100 (BP Location: Right Arm)   Pulse (!) 101   Temp 98 F (36.7 C) (Oral)   Resp 16   Ht 5\' 8"  (1.727 m)   Wt 56.2 kg   SpO2 94%   BMI 18.85 kg/m   Physical Exam 1330: Physical examination:  Nursing notes reviewed; Vital signs and O2 SAT reviewed;  Constitutional: Well developed, Well nourished, Well hydrated, In no acute distress; Head:  Normocephalic, atraumatic; Eyes: EOMI, PERRL, No scleral icterus; ENMT: Mouth and pharynx normal, Mucous membranes moist; Neck: Supple, Full range of motion, No lymphadenopathy; Cardiovascular: Regular rate and rhythm, No gallop; Respiratory: Breath sounds clear & equal bilaterally, No wheezes.  Speaking full sentences with ease, Normal respiratory effort/excursion; Chest: Nontender, Movement normal;  Abdomen: Soft, Nontender, Nondistended, Normal bowel sounds; Genitourinary: No CVA tenderness; Extremities: Peripheral pulses normal, No tenderness, No edema, No calf edema or asymmetry.; Neuro: AA&Ox3, Major CN grossly intact.  Speech clear. No gross focal motor or sensory deficits in extremities. Climbs on and off stretcher easily by himself. Gait steady..; Skin: Color normal, Warm, Dry.   ED Treatments / Results  Labs (all labs ordered are listed, but only abnormal results are displayed)   EKG None  Radiology   Procedures Procedures (including critical care time)  Medications Ordered in ED Medications  sodium chloride 0.9 % bolus 1,000 mL (has no administration in time range)     Initial Impression / Assessment and Plan / ED Course  I have reviewed the triage vital signs and the nursing notes.  Pertinent labs & imaging results that were available during my care of the patient were reviewed by me and considered in my medical decision making (see chart for details).     MDM Reviewed: previous chart, nursing note and vitals   Results for orders placed or performed during the hospital encounter of 07/02/18  CBG  monitoring, ED  Result Value Ref Range   Glucose-Capillary 571 (HH) 70 - 99 mg/dL   Comment 1 Notify RN      1335:  Pt asking multiple times for "pain medicine" and "something stronger than tylenol."' Pt apparently has not been going back to his PMD "because he told me to stop smoking." Also apparently has not gone to Pain Management, as previously instructed. I told pt that we needed to focus in on his elevated glucose today, and needed to check his labs for possible life threatening abnormalities, ie: electrolyte, kidney function abnormalities. Pt is only focusing on "stronger pain medicine" for his chronic back pain. I tried to redirect pt to his elevated CBG, without success. I informed pt we would start with tylenol for his pain. Pt refused, stating again he "wanted something stronger." Pt then got up and walked out of the ED with steady gait, NAD.        Final Clinical Impressions(s) / ED Diagnoses   Final diagnoses:  None    ED Discharge Orders    None       Francine Graven, DO 07/06/18 2215

## 2018-07-02 NOTE — ED Triage Notes (Addendum)
Patient c/o frequent urination with extreme thirst and lower back pain. Patient states diabetic and was taking glimepiride but was told 6-8 months ago to stop taking medication. Patient reports weight loss of 10 pounds and generalized weakness x2 weeks. Patient states took diabetic medication this morning and felt slightly better.

## 2018-07-02 NOTE — ED Notes (Addendum)
Pt refused to have nurse to start iv and nurse tech to do an EKG.  Pt upset about not getting pain medication other than tylenol.  Pt got up and left, edp made aware of pt's leaving.  Pt with steady gait as leaving with a visitor. Pt states he had been taking tylenol at home for the past 2 weeks.

## 2018-07-02 NOTE — ED Notes (Signed)
Verbal order for D/C of insulin drip by Deeann Dowse. PA

## 2018-07-02 NOTE — Discharge Instructions (Addendum)
Your blood sugar was very elevated today, you were given insulin to bring this back down to a normal level.  You will need to start taking glimepiride once daily before breakfast.  Read the information provided on diabetes and nutrition.  You need to be seen by primary care doctor in the next week for continued management of your blood sugar.  He will need to follow-up with primary care pain management regarding your chronic back pain.

## 2018-07-02 NOTE — ED Provider Notes (Signed)
[place] DEPT Provider Note   CSN: [id number] Arrival date & time: 07/02/18  1558    History   Chief Complaint No chief complaint on file.   HPI Joshua [name] is a 63 y.o. male.     Joshua [name] is a 63 y.o. male with history of diabetes, hypertension, migraines, chronic back pain, schizoaffective disorder, and anxiety, who presents to the emergency department for evaluation of hyperglycemia.  Patient reports that for the past several days he has been experiencing extreme thirst and frequent urination.  He is also had generalized fatigue and a weight loss of about 10 pounds over the past 2 weeks.  Patient reports this morning he took swallowing well diabetes medication at home and this seemed to improve his symptoms.  He reports he used to take glimepiride daily but was told by his regular doctor 6 to 8 months ago to discontinue this, he was not on any other anti-hyperglycemics and reports that he has not followed up with that doctor since.  Over the past 2 weeks he is felt like he has had a bad cold and has felt fatigued.  He denies any fevers, initially had some cough and nasal congestion but the symptoms, fatigue remains.  No associated nausea or vomiting, no abdominal pain.  No dysuria but patient does report frequent urination and persistent thirst.  Patient also complaining of pain in his lower back, has a history of low back pain which is chronic, denies any new injury or trauma, no lower extremity numbness or weakness, no saddle anesthesia.  No loss of bowel or bladder control.  Patient initially presented to [facility] today where his sugar was noted to be in the 500s, but patient was more focused on his back pain at the time, after being offered Tylenol for his back pain the patient left but returned to [facility] long ED per the recommendation of his wife for further evaluation of his blood sugar.     Past Medical History:  Diagnosis Date    . Back pain   . Bipolar disorder ([place])   . Cannabis use disorder, moderate, dependence ([place])   . Chronic pain   . Diabetes mellitus without complication ([place])   . Generalized anxiety disorder   . Headache   . Hypertension   . Schizoaffective disorder [facility])     Patient Active Problem List   Diagnosis Date Noted  . Blister of right foot 10/29/2016  . Chronic pain syndrome 10/14/2016  . Osteoarthritis 10/14/2016  . T2DM (type 2 diabetes mellitus) ([place]) 10/14/2016  . HTN (hypertension) 10/14/2016  . Schizoaffective disorder, bipolar type ([place]) 05/10/2016  . Generalized anxiety disorder 05/04/2016    Past Surgical History:  Procedure Laterality Date  . APPENDECTOMY  1970        Home Medications    Prior to Admission medications   Medication Sig Start Date End Date Taking? Authorizing Provider  albuterol (PROVENTIL) (2.5 MG/3ML) 0.083% nebulizer solution Take 3 mLs (2.5 mg total) by nebulization every 6 (six) hours as needed for wheezing or shortness of breath. 02/25/18   Carmin Muskrat, MD  amLODipine (NORVASC) 2.5 MG tablet Take 2.5 mg by mouth daily.    [provider]  budesonide-formoterol (SYMBICORT) 160-4.5 MCG/ACT inhaler Inhale 2 puffs into the lungs 2 (two) times daily.    [provider]  divalproex (DEPAKOTE ER) 500 MG 24 hr tablet Take 3 tablets by mouth at bedtime. 12/05/17   [provider]  doxycycline (VIBRAMYCIN) 100 MG capsule Take 1 capsule (100 mg total) by mouth 2 (two) times daily. 10/12/17   Wurst, [place], PA-C  ibuprofen (ADVIL,MOTRIN) 200 MG tablet Take 200 mg by mouth every 6 (six) hours as needed.    [provider]  lisinopril (PRINIVIL,ZESTRIL) 20 MG tablet Take 20 mg by mouth daily.    [provider]  prazosin (MINIPRESS) 1 MG capsule Take 1 mg by mouth daily.    [provider]  risperiDONE (RISPERDAL) 1 MG tablet Take 1 mg by mouth at bedtime.    [provider]  SUMAtriptan  (IMITREX) 50 MG tablet TK 1 T PO AOS OF HEADACHE. MAY REPEAT EVERY 2 TO 3 H FOR MAX DOSE OF 3 TS IN A 24 HOUR PERIOD 01/10/18   [provider]  tamsulosin (FLOMAX) 0.4 MG CAPS capsule Take 1 capsule (0.4 mg total) by mouth daily. 09/19/17   Wieters, Hallie C, PA-C  traMADol (ULTRAM) 50 MG tablet Take 1 tablet (50 mg total) by mouth every 6 (six) hours as needed. 02/25/18   Carmin Muskrat, MD  traMADol (ULTRAM) 50 MG tablet Take 50 mg by mouth every 4 (four) hours as needed for severe pain.    [provider]  VENTOLIN HFA 108 (90 Base) MCG/ACT inhaler Inhale 2 puffs into the lungs every 4 (four) hours as needed. 10/14/16   Virginia Crews, MD    Family History Family History  Problem Relation Age of Onset  . Diabetes Mother   . Cancer Mother        unsure what type  . Heart disease Mother   . Hypertension Mother   . Dementia Father   . Diabetes Brother   . Dementia Maternal Grandfather   . Cancer Paternal Grandmother 79       breast  . Mental illness Neg Hx     Social History Social History   Tobacco Use  . Smoking status: Current Every Day Smoker    Packs/day: 2.00    Types: Cigarettes  . Smokeless tobacco: Never Used  Substance Use Topics  . Alcohol use: Not Currently  . Drug use: Yes    Types: Marijuana    Comment: "have changed to hemp now" 09/17/17     Allergies   Imitrex [sumatriptan]   Review of Systems Review of Systems  Constitutional: Positive for fatigue. Negative for chills and fever.  HENT: Negative.   Eyes: Negative for visual disturbance.  Respiratory: Negative for cough and shortness of breath.   Cardiovascular: Negative for chest pain.  Gastrointestinal: Negative for abdominal pain, diarrhea, nausea and vomiting.  Endocrine: Positive for polydipsia and polyuria.  Genitourinary: Positive for frequency. Negative for dysuria, flank pain and hematuria.  Musculoskeletal: Negative for arthralgias and myalgias.  Skin: Negative for  color change and rash.  Neurological: Negative for dizziness, weakness, light-headedness, numbness and headaches.  All other systems reviewed and are negative.    Physical Exam Updated Vital Signs BP (!) 139/99 (BP Location: Left Arm)   Pulse 94   Resp 16   SpO2 97%   Physical Exam Vitals signs and nursing note reviewed.  Constitutional:      General: He is not in acute distress.    Appearance: Normal appearance. He is well-developed. He is not ill-appearing or diaphoretic.  HENT:     Head: Normocephalic and atraumatic.     Mouth/Throat:     Mouth: Mucous membranes are moist.     Pharynx: Oropharynx is clear.  Eyes:     General:        Right eye: No discharge.        Left eye: No discharge.     Pupils: Pupils are equal, round, and reactive to light.  Neck:     Musculoskeletal: Neck supple.  Cardiovascular:     Rate and Rhythm: Normal rate and regular rhythm.     Pulses: Normal pulses.     Heart sounds: No murmur. No friction rub. No gallop.   Pulmonary:     Effort: Pulmonary effort is normal. No respiratory distress.     Breath sounds: Normal breath sounds. No wheezing or rales.     Comments: Respirations equal and unlabored, patient able to speak in full sentences, lungs clear to auscultation bilaterally Abdominal:     General: Bowel sounds are normal. There is no distension.     Palpations: Abdomen is soft. There is no mass.     Tenderness: There is no abdominal tenderness. There is no guarding.     Comments: Abdomen soft, nondistended, nontender to palpation in all quadrants without guarding or peritoneal signs  Musculoskeletal:        General: No deformity.     Right lower leg: No edema.     Left lower leg: No edema.  Skin:    General: Skin is warm and dry.     Capillary Refill: Capillary refill takes less than 2 seconds.  Neurological:     Mental Status: He is alert and oriented to person, place, and time.     Coordination: Coordination normal.     Comments:  Speech is clear, able to follow commands CN III-XII intact Normal strength in upper and lower extremities bilaterally including dorsiflexion and plantar flexion, strong and equal grip strength Sensation normal to light and sharp touch Moves extremities without ataxia, coordination intact   Psychiatric:        Mood and Affect: Mood normal.        Behavior: Behavior normal.      ED Treatments / Results  Labs (all labs ordered are listed, but only abnormal results are displayed) Labs Reviewed  URINALYSIS, ROUTINE W REFLEX MICROSCOPIC - Abnormal; Notable for the following components:      Result Value   Color, Urine STRAW (*)    Glucose, UA >=500 (*)    Ketones, ur 5 (*)    All other components within normal limits  CBG MONITORING, ED - Abnormal; Notable for the following components:   Glucose-Capillary 561 (*)    All other components within normal limits  CBC  BASIC METABOLIC PANEL    EKG None  Radiology No results found.  Procedures Procedures (including critical care time)  Medications Ordered in ED Medications  sodium chloride 0.9 % bolus 1,000 mL (has no administration in time range)     Initial Impression / Assessment and Plan / ED Course  I have reviewed the triage vital signs and the nursing notes.  Pertinent labs & imaging results that were available during my care of the patient were reviewed by me and considered in my medical decision making (see chart for details).  Patient presents with hyperglycemia, known history of type 2 diabetes but has been off all medications for 6 to 8 months.  Sugar in the 500s on arrival, but BMP is overall reassuring with normal anion gap, CO2 of 18 suggesting patient may have been on his way to DKA but no DKA at this time.  Urinalysis shows glucose  with no signs of infection, CBC unremarkable.  Normal renal function.  Will give fluid boluses and start patient on glucose stabilizer protocol, potassium is borderline low and I  anticipate it lowering with IV glucose so will give 1 run of potassium and p.o. potassium.  Glucose is come down nicely with glucose stabilizer protocol and is now 195.  Patient is feeling much better.  Continues to complain of his chronic low back pain, I have discussed with the patient will give him 1 dose of Toradol and then will have patient follow-up with his primary care doctor or pain management for further evaluation of this, he expresses understanding and agreement.  Will start patient back on glimepiride once daily as he tolerated this well in the past.  Stressed with patient the importance of close follow-up for continued glucose management.  Discussed appropriate return precautions.  Patient expresses understanding and agreement with plan.  Final Clinical Impressions(s) / ED Diagnoses   Final diagnoses:  Hyperglycemia  Chronic bilateral low back pain without sciatica    ED Discharge Orders         Ordered    glimepiride (AMARYL) 1 MG tablet  Daily with breakfast     07/02/18 2219           Jacqlyn Larsen, PA-C 07/06/18 2314    Daleen Bo, MD 07/09/18 1125

## 2018-07-05 ENCOUNTER — Telehealth: Payer: Self-pay | Admitting: *Deleted

## 2018-07-05 NOTE — Telephone Encounter (Signed)
ED NCM received call back from pt and states he was able to secure an appt at the [facility] on 07/12/2018 to follow up with PCP. States he does plan to keep the appt. Jonnie Finner RN CCM Case Mgmt phone [phone]

## 2018-07-12 ENCOUNTER — Other Ambulatory Visit: Payer: Self-pay

## 2018-07-12 ENCOUNTER — Ambulatory Visit: Payer: Medicare Other | Attending: Family Medicine | Admitting: Physician Assistant

## 2018-07-12 VITALS — BP 157/104 | HR 87 | Temp 98.0°F | Resp 16 | Ht 69.0 in | Wt 224.2 lb

## 2018-07-12 DIAGNOSIS — F319 Bipolar disorder, unspecified: Secondary | ICD-10-CM | POA: Insufficient documentation

## 2018-07-12 DIAGNOSIS — J9801 Acute bronchospasm: Secondary | ICD-10-CM

## 2018-07-12 DIAGNOSIS — F259 Schizoaffective disorder, unspecified: Secondary | ICD-10-CM | POA: Insufficient documentation

## 2018-07-12 DIAGNOSIS — Z7951 Long term (current) use of inhaled steroids: Secondary | ICD-10-CM | POA: Insufficient documentation

## 2018-07-12 DIAGNOSIS — E1165 Type 2 diabetes mellitus with hyperglycemia: Secondary | ICD-10-CM | POA: Diagnosis not present

## 2018-07-12 DIAGNOSIS — Z881 Allergy status to other antibiotic agents status: Secondary | ICD-10-CM | POA: Insufficient documentation

## 2018-07-12 DIAGNOSIS — F17210 Nicotine dependence, cigarettes, uncomplicated: Secondary | ICD-10-CM | POA: Diagnosis not present

## 2018-07-12 DIAGNOSIS — R5383 Other fatigue: Secondary | ICD-10-CM | POA: Diagnosis not present

## 2018-07-12 DIAGNOSIS — I10 Essential (primary) hypertension: Secondary | ICD-10-CM | POA: Diagnosis not present

## 2018-07-12 DIAGNOSIS — M545 Low back pain: Secondary | ICD-10-CM

## 2018-07-12 DIAGNOSIS — Z79899 Other long term (current) drug therapy: Secondary | ICD-10-CM | POA: Diagnosis not present

## 2018-07-12 DIAGNOSIS — G8929 Other chronic pain: Secondary | ICD-10-CM | POA: Diagnosis not present

## 2018-07-12 DIAGNOSIS — Z7984 Long term (current) use of oral hypoglycemic drugs: Secondary | ICD-10-CM | POA: Insufficient documentation

## 2018-07-12 DIAGNOSIS — F411 Generalized anxiety disorder: Secondary | ICD-10-CM | POA: Insufficient documentation

## 2018-07-12 DIAGNOSIS — R634 Abnormal weight loss: Secondary | ICD-10-CM

## 2018-07-12 DIAGNOSIS — F1220 Cannabis dependence, uncomplicated: Secondary | ICD-10-CM | POA: Diagnosis not present

## 2018-07-12 DIAGNOSIS — Z09 Encounter for follow-up examination after completed treatment for conditions other than malignant neoplasm: Secondary | ICD-10-CM | POA: Diagnosis not present

## 2018-07-12 LAB — GLUCOSE, POCT (MANUAL RESULT ENTRY): POC Glucose: 389 mg/dl — AB (ref 70–99)

## 2018-07-12 MED ORDER — ALBUTEROL SULFATE (2.5 MG/3ML) 0.083% IN NEBU: 2.5000 mg | INHALATION_SOLUTION | Freq: Four times a day (QID) | RESPIRATORY_TRACT | 12 refills | Status: DC | PRN

## 2018-07-12 MED ORDER — GLUCOSE BLOOD VI STRP: ORAL_STRIP | 12 refills | Status: DC

## 2018-07-12 MED ORDER — TRUE METRIX METER W/DEVICE KIT: PACK | 0 refills | Status: DC

## 2018-07-12 MED ORDER — BUDESONIDE-FORMOTEROL FUMARATE 160-4.5 MCG/ACT IN AERO: 2.0000 | INHALATION_SPRAY | Freq: Two times a day (BID) | RESPIRATORY_TRACT | 3 refills | Status: DC

## 2018-07-12 MED ORDER — LISINOPRIL 20 MG PO TABS: 20.0000 mg | ORAL_TABLET | Freq: Every day | ORAL | 3 refills | Status: DC

## 2018-07-12 MED ORDER — TRUEPLUS LANCETS 28G MISC: 1.0000 | Freq: Two times a day (BID) | 1 refills | Status: DC

## 2018-07-12 MED ORDER — GLIMEPIRIDE 4 MG PO TABS: 4.0000 mg | ORAL_TABLET | Freq: Every day | ORAL | 1 refills | Status: DC

## 2018-07-12 MED ORDER — VENTOLIN HFA 108 (90 BASE) MCG/ACT IN AERS: 2.0000 | INHALATION_SPRAY | RESPIRATORY_TRACT | 0 refills | Status: DC | PRN

## 2018-07-12 NOTE — Patient Instructions (Signed)
Check blood sugars fasting and at bedtime and record and bring to your next visit.  Check blood pressure 3-5 times weekly and record and bring to next visit.  Eliminate sugars and white carbohydrates from your diet.

## 2018-07-12 NOTE — Telephone Encounter (Signed)
Received call from pt stating his glucometer and test strips copay was $400. Pt gave NCM permission to follow up with his pharmacy. Contacted [facility] and spoke to pharmacist. States they do not have updated Medicare Card. Faxed copy of Medicare card to pharmacy. NCM contacted pt to make aware, he will give pharmacy a call back to follow up to see if glucometer and test strips went through. [facility]. Will arrange follow up on 07/12/2018. Jonnie Finner RN CCM Case Mgmt phone [phone]

## 2018-07-12 NOTE — Progress Notes (Signed)
Patient ID: Joshua [name], male   DOB: 07/24/1955, 63 y.o.   MRN: [id number]     Joshua [name], is a 63 y.o. male  JFH:[id number]  [id number]:[id number]  DOB - 12-Aug-1955  Subjective:  Chief Complaint and HPI: Joshua [name] is a 63 y.o. male here today to establish care and for a follow up visit After being seen in the ED 07/02/2018.  Blood sugar in ED was 561.  Out of all diabetes meds for >26month. Stopped taking BP meds months ago too.  Was only restarted on glimiperide 139mdaily.  Feeling much better.  Doesn't have a glucometer/not checking blood sugars.    From ED note: DoFOUNTAIN DERUSHAs a [age].o. male with history of diabetes, hypertension, migraines, chronic back pain, schizoaffective disorder, and anxiety, who presents to the emergency department for evaluation of hyperglycemia.  Patient reports that for the past several days he has been experiencing extreme thirst and frequent urination.  He is also had generalized fatigue and a weight loss of about 10 pounds over the past 2 weeks.  Patient reports this morning he took swallowing well diabetes medication at home and this seemed to improve his symptoms.  He reports he used to take glimepiride daily but was told by his regular doctor 6 to 8 months ago to discontinue this, he was not on any other anti-hyperglycemics and reports that he has not followed up with that doctor since.  Over the past 2 weeks he is felt like he has had a bad cold and has felt fatigued.  He denies any fevers, initially had some cough and nasal congestion but the symptoms, fatigue remains.  No associated nausea or vomiting, no abdominal pain.  No dysuria but patient does report frequent urination and persistent thirst.  Patient also complaining of pain in his lower back, has a history of low back pain which is chronic, denies any new injury or trauma, no lower extremity numbness or weakness, no saddle anesthesia.  No loss of bowel or bladder control.  [facility] to be in [facility] 500s, but patient was more focused on his back pain at the time, after being offered Tylenol for his back pain the patient left [facility] of his wife for further evaluation of his blood sugar.  From A/P: Patient presents with hyperglycemia, known history of type 2 diabetes but has been off all medications for 6 to 8 months.  Sugar in the 500s on arrival, but BMP is overall reassuring with normal anion gap, CO2 of 18 suggesting patient may have been on his way to DKA but no DKA at this time.  Urinalysis shows glucose with no signs of infection, CBC unremarkable.  Normal renal function.  Will give fluid boluses and start patient on glucose stabilizer protocol, potassium is borderline low and I anticipate it lowering with IV glucose so will give 1 run of potassium and p.o. potassium.  Glucose is come down nicely with glucose stabilizer protocol and is now 195.  Patient is feeling much better.  Continues to complain of his chronic low back pain, I have discussed with the patient will give him 1 dose of Toradol and then will have patient follow-up with his primary care doctor or pain management for further evaluation of this, he expresses understanding and agreement.  Will start patient back on glimepiride once daily as he tolerated this well in the  past.  Stressed with patient the importance of close follow-up for continued glucose management.  Discussed appropriate return precautions.  Patient expresses understanding and agreement with plan.   ED/Hospital notes reviewed summarized above. Social:  Cira Rue is with him  ROS:   Constitutional:  No f/c, No night sweats, No unexplained weight loss. EENT:  No vision changes, No blurry vision, No hearing changes. No mouth, throat, or ear problems.  Respiratory: No cough, No SOB, +wheezing Cardiac: No CP, no palpitations GI:  No abd  pain, No N/V/D. GU: No Urinary s/sx Musculoskeletal: No joint pain Neuro: No headache, no dizziness, no motor weakness.  Skin: No rash Endocrine:  No polydipsia. No polyuria.  Psych: Denies SI/HI  No problems updated.  ALLERGIES: Allergies  Allergen Reactions   Imitrex [Sumatriptan] Other (See Comments)    mania    PAST MEDICAL HISTORY: Past Medical History:  Diagnosis Date   Back pain    Bipolar disorder ([place])    Cannabis use disorder, moderate, dependence (H[facility])    Chronic pain    Diabetes mellitus without complication ([facility])    Generalized anxiety disorder    Headache    Hypertension    Schizoaffective disorder ([place])     MEDICATIONS AT HOME: Prior to Admission medications   Medication Sig Start Date End Date Taking? Authorizing Provider  albuterol (PROVENTIL) (2.5 MG/3ML) 0.083% nebulizer solution Take 3 mLs (2.5 mg total) by nebulization every 6 (six) hours as needed for wheezing or shortness of breath. 07/12/18   Argentina Donovan, PA-C  Blood Glucose Monitoring Suppl (TRUE METRIX METER) w/Device KIT Check blood sugars fasting and at bedtime 07/12/18   Freeman Caldron M, PA-C  budesonide-formoterol [facility]) 160-4.5 MCG/ACT inhaler Inhale 2 puffs into the lungs 2 (two) times daily. 07/12/18   Argentina Donovan, PA-C  divalproex (DEPAKOTE ER) 500 MG 24 hr tablet Take 3 tablets by mouth at bedtime. 12/05/17   [provider]  glimepiride (AMARYL) 4 MG tablet Take 1 tablet (4 mg total) by mouth daily with breakfast. 07/12/18   Argentina Donovan, PA-C  hydrOXYzine (ATARAX/VISTARIL) 50 MG tablet Take 50 mg by mouth 2 (two) times daily.  06/23/18   [provider]  lisinopril (PRINIVIL,ZESTRIL) 20 MG tablet Take 1 tablet (20 mg total) by mouth daily. 07/12/18   Argentina Donovan, PA-C  prazosin (MINIPRESS) 1 MG capsule Take 1 mg by mouth at bedtime as needed (nightmares).     [provider]  risperiDONE (RISPERDAL) 3 MG tablet Take 3 mg by mouth  daily. 06/25/18   [provider]  tamsulosin (FLOMAX) 0.4 MG CAPS capsule Take 1 capsule (0.4 mg total) by mouth daily. Patient not taking: Reported on 07/02/2018 09/19/17   Wieters, Hallie C, PA-C  traZODone (DESYREL) 100 MG tablet Take 200 mg by mouth at bedtime as needed for sleep.  06/26/18   [provider]  TRUEplus Lancets 28G MISC 1 each by Does not apply route 2 (two) times daily. 07/12/18   Argentina Donovan, PA-C  VENTOLIN HFA 108 (90 Base) MCG/ACT inhaler Inhale 2 puffs into the lungs every 4 (four) hours as needed. 07/12/18   Argentina Donovan, PA-C     Objective:  EXAM:   Vitals:   07/12/18 1058  BP: (!) 157/104  Pulse: 87  Resp: 16  Temp: 98 F (36.7 C)  TempSrc: Oral  SpO2: 98%  Weight: 224 lb 3.2 oz (101.7 kg)  Height: 5' 9" (1.753 m)    General  appearance : A&OX3. NAD. Non-toxic-appearing HEENT: Atraumatic and Normocephalic.  PERRLA. EOM intact.   Neck: supple, no JVD. No cervical lymphadenopathy. No thyromegaly Chest/Lungs:  Breathing-non-labored, Good air entry bilaterally, breath sounds without rales and rhonchi. There is mild wheezing throughout CVS: S1 S2 regular, no murmurs, gallops, rubs  Extremities: Bilateral Lower Ext shows no edema, both legs are warm to touch with = pulse throughout Neurology:  CN II-XII grossly intact, Non focal.   Psych:  TP linear. J/I WNL. Normal speech. Appropriate eye contact and affect.  Skin:  No Rash  Data Review Lab Results  Component Value Date   HGBA1C 6.3 (H) 09/30/2016     Assessment & Plan   1. Type 2 diabetes mellitus with hyperglycemia, unspecified whether long term insulin use (HCC) Uncontrolled.  Increase dose of amaryl over the next week to 4 mg - POCT URINALYSIS DIP (CLINITEK) - Glucose (CBG) - Hemoglobin A1c - Blood Glucose Monitoring Suppl (TRUE METRIX METER) w/Device KIT; Check blood sugars fasting and at bedtime  Dispense: 1 kit; Refill: 0 - TRUEplus Lancets 28G MISC; 1 each by Does not  apply route 2 (two) times daily.  Dispense: 100 each; Refill: 1 - glimepiride (AMARYL) 4 MG tablet; Take 1 tablet (4 mg total) by mouth daily with breakfast.  Dispense: 90 tablet; Refill: 1  2. Essential hypertension Uncontrolled-resume - lisinopril (PRINIVIL,ZESTRIL) 20 MG tablet; Take 1 tablet (20 mg total) by mouth daily.  Dispense: 90 tablet; Refill: 3  3. Bronchospasm Smoking cessation advised. RF meds - budesonide-formoterol (SYMBICORT) 160-4.5 MCG/ACT inhaler; Inhale 2 puffs into the lungs 2 (two) times daily.  Dispense: 1 Inhaler; Refill: 3 - albuterol (PROVENTIL) (2.5 MG/3ML) 0.083% nebulizer solution; Take 3 mLs (2.5 mg total) by nebulization every 6 (six) hours as needed for wheezing or shortness of breath.  Dispense: 75 mL; Refill: 12 - VENTOLIN HFA 108 (90 Base) MCG/ACT inhaler; Inhale 2 puffs into the lungs every 4 (four) hours as needed.  Dispense: 1 Inhaler; Refill: 0  4. Encounter for examination following treatment at hospital patient improving.  Spent >25 mins face to face counseling on disease process of DM and htn as well as diet, exercise, smoking cessation, and glucose monitoring.     Patient have been counseled extensively about nutrition and exercise  Return in about 1 month (around 08/12/2018) for assign PCP-recheck BP and DM.  The patient was given clear instructions to go to ER or return to medical center if symptoms don't improve, worsen or new problems develop. The patient verbalized understanding. The patient was told to call to get lab results if they haven't heard anything in the next week.     Freeman Caldron, PA-C [facility] [place], [place]   07/12/2018, 11:36 AM

## 2018-07-13 LAB — HEMOGLOBIN A1C
Est. average glucose Bld gHb Est-mCnc: 286 mg/dL
Hgb A1c MFr Bld: 11.6 % — ABNORMAL HIGH (ref 4.8–5.6)

## 2018-07-14 ENCOUNTER — Telehealth: Payer: Self-pay | Admitting: *Deleted

## 2018-07-14 NOTE — Telephone Encounter (Signed)
Voicemail states to give a call back to [place] with [facility] at [phone]. Medical Assistant left message on patient's home and cell voicemail. Patient is aware of A1C being elevated due to being off his medications . Patient advised to restart regimen daily and limiting sugar and carb intake and to follow up as planned.

## 2018-07-14 NOTE — Telephone Encounter (Signed)
TOC CM/SW note  [facility] ED TOC RN contacted pt and left voice message for a return call. Referral sent to the Nutrition and Diabetes Education for pt to follow up on Diabetes teaching and nutrition. Jonnie Finner RN CCM Case Mgmt phone [phone]

## 2018-07-14 NOTE — Telephone Encounter (Signed)
-----   Message from Argentina Donovan, [place] sent at 07/13/2018  8:42 AM EDT ----- Your hemoglobin A1C is 11.6 which is very high as expected since you have been off your diabetes meds for a while until recently.  This should improve with better diet and being back on your meds.  Follow-up as planned.  Thanks, Freeman Caldron, PA-C

## 2018-07-17 ENCOUNTER — Telehealth: Payer: Self-pay | Admitting: *Deleted

## 2018-07-17 ENCOUNTER — Other Ambulatory Visit: Payer: Self-pay

## 2018-07-17 ENCOUNTER — Telehealth: Payer: Self-pay | Admitting: General Practice

## 2018-07-17 ENCOUNTER — Ambulatory Visit (HOSPITAL_COMMUNITY)
Admission: EM | Admit: 2018-07-17 | Discharge: 2018-07-17 | Disposition: A | Discharge status: Home / Self Care | Payer: Medicare Other | Attending: Internal Medicine | Admitting: Internal Medicine

## 2018-07-17 ENCOUNTER — Encounter (HOSPITAL_COMMUNITY): Payer: Self-pay

## 2018-07-17 DIAGNOSIS — E1165 Type 2 diabetes mellitus with hyperglycemia: Secondary | ICD-10-CM

## 2018-07-17 DIAGNOSIS — J40 Bronchitis, not specified as acute or chronic: Secondary | ICD-10-CM

## 2018-07-17 DIAGNOSIS — F17210 Nicotine dependence, cigarettes, uncomplicated: Secondary | ICD-10-CM

## 2018-07-17 DIAGNOSIS — I10 Essential (primary) hypertension: Secondary | ICD-10-CM

## 2018-07-17 DIAGNOSIS — S41131A Puncture wound without foreign body of right upper arm, initial encounter: Secondary | ICD-10-CM

## 2018-07-17 DIAGNOSIS — T148XXA Other injury of unspecified body region, initial encounter: Secondary | ICD-10-CM

## 2018-07-17 MED ORDER — GLIMEPIRIDE 4 MG PO TABS: 4.0000 mg | ORAL_TABLET | Freq: Every day | ORAL | 0 refills | Status: DC

## 2018-07-17 MED ORDER — AZITHROMYCIN 250 MG PO TABS: ORAL_TABLET | ORAL | 0 refills | Status: DC

## 2018-07-17 MED ORDER — LISINOPRIL 20 MG PO TABS: 20.0000 mg | ORAL_TABLET | Freq: Every day | ORAL | 0 refills | Status: DC

## 2018-07-17 MED ORDER — TETANUS-DIPHTH-ACELL PERTUSSIS 5-2.5-18.5 LF-MCG/0.5 IM SUSP: 0.5000 mL | Freq: Once | INTRAMUSCULAR | Status: DC

## 2018-07-17 MED ORDER — IPRATROPIUM BROMIDE 0.02 % IN SOLN: 0.5000 mg | Freq: Four times a day (QID) | RESPIRATORY_TRACT | 0 refills | Status: DC

## 2018-07-17 NOTE — ED Notes (Signed)
Patient verbalizes understanding of discharge instructions. Opportunity for questioning and answers were provided. Patient discharged from [facility] by APP.

## 2018-07-17 NOTE — ED Provider Notes (Signed)
[place]    CSN: [id number] Arrival date & time: 07/17/18  1916     History   Chief Complaint Chief Complaint  Patient presents with   Cough    HPI Joshua [name] is a 63 y.o. male.   1-here due to getting poked his L upper arm with a wire this week when he was throwing food to his dogs. He thinks he is getting locked jaw.  2- His chronic cough is  getting worse this week, has purulent mucous which gets worse when he smokes. Has had more wheezing than usual, and he feels the albuterol nebs makes his cough worse and makes him feel his throat is closing. No trouble eating or swallowing.      Past Medical History:  Diagnosis Date   Back pain    Bipolar disorder ([place])    Cannabis use disorder, moderate, dependence ([place])    Chronic pain    Diabetes mellitus without complication ([place])    Generalized anxiety disorder    Headache    Hypertension    Schizoaffective disorder [facility])     Patient Active Problem List   Diagnosis Date Noted   Blister of right foot 10/29/2016   Chronic pain syndrome 10/14/2016   Osteoarthritis 10/14/2016   T2DM (type 2 diabetes mellitus) ([place]) 10/14/2016   HTN (hypertension) 10/14/2016   Schizoaffective disorder, bipolar type ([place]) 05/10/2016   Generalized anxiety disorder 05/04/2016    Past Surgical History:  Procedure Laterality Date   APPENDECTOMY  1970       Home Medications    Prior to Admission medications   Medication Sig Start Date End Date Taking? Authorizing Provider  albuterol (PROVENTIL) (2.5 MG/3ML) 0.083% nebulizer solution Take 3 mLs (2.5 mg total) by nebulization every 6 (six) hours as needed for wheezing or shortness of breath. 07/12/18   Argentina Donovan, PA-C  Blood Glucose Monitoring Suppl (TRUE METRIX METER) w/Device KIT Check blood sugars fasting and at bedtime 07/12/18   Freeman Caldron M, PA-C  budesonide-formoterol [facility]) 160-4.5 MCG/ACT inhaler Inhale 2 puffs into the lungs  2 (two) times daily. 07/12/18   Argentina Donovan, PA-C  divalproex (DEPAKOTE ER) 500 MG 24 hr tablet Take 3 tablets by mouth at bedtime. 12/05/17   [provider]  glimepiride (AMARYL) 4 MG tablet Take 1 tablet (4 mg total) by mouth daily with breakfast. 07/12/18   Argentina Donovan, PA-C  glucose blood test strip Use as instructed 07/12/18   Argentina Donovan, PA-C  hydrOXYzine (ATARAX/VISTARIL) 50 MG tablet Take 50 mg by mouth 2 (two) times daily.  06/23/18   [provider]  lisinopril (PRINIVIL,ZESTRIL) 20 MG tablet Take 1 tablet (20 mg total) by mouth daily. 07/12/18   Argentina Donovan, PA-C  prazosin (MINIPRESS) 1 MG capsule Take 1 mg by mouth at bedtime as needed (nightmares).     [provider]  risperiDONE (RISPERDAL) 3 MG tablet Take 3 mg by mouth daily. 06/25/18   [provider]  traZODone (DESYREL) 100 MG tablet Take 200 mg by mouth at bedtime as needed for sleep.  06/26/18   [provider]  TRUEplus Lancets 28G MISC 1 each by Does not apply route 2 (two) times daily. 07/12/18   Argentina Donovan, PA-C  TRUEplus Lancets 28G MISC 1 each by Does not apply route 2 (two) times daily. 07/12/18   Argentina Donovan, PA-C  VENTOLIN HFA 108 (90 Base) MCG/ACT inhaler Inhale 2 puffs into the lungs  every 4 (four) hours as needed. 07/12/18   Argentina Donovan, PA-C    Family History Family History  Problem Relation Age of Onset   Diabetes Mother    Cancer Mother        unsure what type   Heart disease Mother    Hypertension Mother    Dementia Father    Diabetes Brother    Dementia Maternal Grandfather    Cancer Paternal Grandmother 71       breast   Mental illness Neg Hx     Social History Social History   Tobacco Use   Smoking status: Current Every Day Smoker    Packs/day: 2.00    Types: Cigarettes   Smokeless tobacco: Never Used  Substance Use Topics   Alcohol use: Not Currently   Drug use: Yes    Types: Marijuana     Comment: "have changed to hemp now" 09/17/17     Allergies   Imitrex [sumatriptan]   Review of Systems Review of Systems  Constitutional: Negative for chills, diaphoresis and fever.  HENT: Negative for congestion, dental problem, drooling, ear discharge, ear pain, facial swelling, postnasal drip, rhinorrhea, sinus pressure, sinus pain, sore throat and trouble swallowing.   Respiratory: Positive for cough and wheezing. Negative for shortness of breath.   Gastrointestinal: Negative for abdominal pain, nausea and vomiting.  Endocrine: Negative for polyphagia and polyuria.  Genitourinary: Negative for dysuria.  Musculoskeletal: Negative for gait problem.  Skin: Positive for wound. Negative for rash.     Physical Exam Triage Vital Signs ED Triage Vitals  Enc Vitals Group     BP 07/17/18 1926 (!) 151/94     Pulse Rate 07/17/18 1926 (!) 102     Resp 07/17/18 1926 16     Temp 07/17/18 1926 98.5 F (36.9 C)     Temp Source 07/17/18 1926 Oral     SpO2 07/17/18 1926 100 %     Weight --      Height --      Head Circumference --      Peak Flow --      Pain Score 07/17/18 1928 6     Pain Loc --      Pain Edu? --      Excl. in [place]? --    No data found.  Updated Vital Signs BP (!) 151/94 (BP Location: Left Arm)    Pulse (!) 102    Temp 98.5 F (36.9 C) (Oral)    Resp 16    SpO2 100%   Visual Acuity Right Eye Distance:   Left Eye Distance:   Bilateral Distance:    Right Eye Near:   Left Eye Near:    Bilateral Near:     Physical Exam Constitutional:      General: He is not in acute distress.    Appearance: Normal appearance. He is not ill-appearing, toxic-appearing or diaphoretic.  HENT:     Head: Normocephalic.     Right Ear: Tympanic membrane, ear canal and external ear normal.     Left Ear: Tympanic membrane, ear canal and external ear normal.     Nose: Nose normal.     Mouth/Throat:     Pharynx: Oropharynx is clear.  Eyes:     General: No scleral icterus.        Right eye: No discharge.        Left eye: No discharge.     Conjunctiva/sclera: Conjunctivae normal.  Neck:  Musculoskeletal: Neck supple. No neck rigidity.  Cardiovascular:     Rate and Rhythm: Normal rate and regular rhythm.     Heart sounds: No murmur.  Pulmonary:     Effort: Pulmonary effort is normal.     Breath sounds: Wheezing present. No rhonchi or rales.  Musculoskeletal: Normal range of motion.  Lymphadenopathy:     Cervical: No cervical adenopathy.  Skin:    General: Skin is warm and dry.     Comments: Has a healing scab on R upper inner arm which looks like a puncture wound and has slight ecchymosis around it. Not hot or red.   Neurological:     Mental Status: He is alert and oriented to person, place, and time.     Motor: No weakness.     Gait: Gait normal.  Psychiatric:        Mood and Affect: Mood normal.        Behavior: Behavior normal.        Thought Content: Thought content normal.        Judgment: Judgment normal.      UC Treatments / Results  Labs (all labs ordered are listed, but only abnormal results are displayed) Labs Reviewed - No data to display  EKG None  Radiology No results found.  Procedures Procedures   Medications Ordered in UC Medications - No data to display  Initial Impression / Assessment and Plan / UC Course  I have reviewed the triage vital signs and the nursing notes. I placed him on Zpack and sent Atrovent to try into his neb When it was time to leave he asked if I could sent his Prinivil and Amaryl to [facility] since they were cheaper than [facility]. So I did.  See instructions Final Clinical Impressions(s) / UC Diagnoses   Final diagnoses:  None   Discharge Instructions   None    ED Prescriptions    None     Controlled Substance Prescriptions [place] Controlled Substance Registry consulted?    Shelby Mattocks, Hershal Coria 07/17/18 2013

## 2018-07-17 NOTE — ED Triage Notes (Signed)
Patient presents to Urgent Care with complaints of cough "for years", chest tightness since today, and "thinking I got lockjaw after I got stuck with a rusty nail 2 weeks ago and ain't see nobody for it." Patient states he smokes every day and loves it, his friends call him "Smokey".

## 2018-07-17 NOTE — Discharge Instructions (Addendum)
I sent your Amaryl for diabetes and Prinivil to [facility] and start on this right away. Your last tatanus was 6/ 2019, so you are up to date.  I sent the bronchitis medication as well.   Follow up with your family Dr next week to have your lungs rechecked.

## 2018-07-17 NOTE — Telephone Encounter (Signed)
[facility] to follow up on glucometer and test strips. Pharmacy will fax over Medical Form to complete to bill through his Medicare B to [facility], [employer] [place]. Contacted [place] and they will leave message in box for provider. Contacted pt to make aware and provided information. Jonnie Finner RN CCM Case Mgmt phone [phone]

## 2018-07-17 NOTE — Telephone Encounter (Signed)
[place] nurse case manager called in stating [facility] would Fax over paperwork for pt glucouse meter to be signed off

## 2018-07-21 ENCOUNTER — Other Ambulatory Visit: Payer: Self-pay

## 2018-07-21 ENCOUNTER — Emergency Department (HOSPITAL_COMMUNITY)
Admission: EM | Admit: 2018-07-21 | Discharge: 2018-07-21 | Disposition: A | Discharge status: Home / Self Care | Payer: Medicare Other | Attending: Emergency Medicine | Admitting: Emergency Medicine

## 2018-07-21 DIAGNOSIS — R739 Hyperglycemia, unspecified: Secondary | ICD-10-CM

## 2018-07-21 DIAGNOSIS — R05 Cough: Secondary | ICD-10-CM

## 2018-07-21 DIAGNOSIS — R059 Cough, unspecified: Secondary | ICD-10-CM

## 2018-07-21 DIAGNOSIS — E1165 Type 2 diabetes mellitus with hyperglycemia: Secondary | ICD-10-CM | POA: Diagnosis not present

## 2018-07-21 DIAGNOSIS — I10 Essential (primary) hypertension: Secondary | ICD-10-CM | POA: Insufficient documentation

## 2018-07-21 DIAGNOSIS — R0902 Hypoxemia: Secondary | ICD-10-CM | POA: Diagnosis not present

## 2018-07-21 DIAGNOSIS — R000 Tachycardia, unspecified: Secondary | ICD-10-CM | POA: Diagnosis not present

## 2018-07-21 DIAGNOSIS — F17210 Nicotine dependence, cigarettes, uncomplicated: Secondary | ICD-10-CM | POA: Insufficient documentation

## 2018-07-21 DIAGNOSIS — Z7984 Long term (current) use of oral hypoglycemic drugs: Secondary | ICD-10-CM | POA: Insufficient documentation

## 2018-07-21 LAB — URINALYSIS, ROUTINE W REFLEX MICROSCOPIC
Bacteria, UA: NONE SEEN
Bilirubin Urine: NEGATIVE
HGB URINE DIPSTICK: NEGATIVE
Ketones, ur: 5 mg/dL — AB
LEUKOCYTE UA: NEGATIVE
Nitrite: NEGATIVE
Protein, ur: NEGATIVE mg/dL
Specific Gravity, Urine: 1.029 (ref 1.005–1.030)
pH: 6 (ref 5.0–8.0)

## 2018-07-21 LAB — CBC
HCT: 40.7 % (ref 39.0–52.0)
Hemoglobin: 13.9 g/dL (ref 13.0–17.0)
MCH: 32.6 pg (ref 26.0–34.0)
MCHC: 34.2 g/dL (ref 30.0–36.0)
MCV: 95.5 fL (ref 80.0–100.0)
NRBC: 0 % (ref 0.0–0.2)
PLATELETS: 111 10*3/uL — AB (ref 150–400)
RBC: 4.26 MIL/uL (ref 4.22–5.81)
RDW: 12.9 % (ref 11.5–15.5)
WBC: 5.2 10*3/uL (ref 4.0–10.5)

## 2018-07-21 LAB — CBG MONITORING, ED
Glucose-Capillary: 363 mg/dL — ABNORMAL HIGH (ref 70–99)
Glucose-Capillary: 284 mg/dL — ABNORMAL HIGH (ref 70–99)

## 2018-07-21 LAB — BASIC METABOLIC PANEL
Anion gap: 7 (ref 5–15)
BUN: 16 mg/dL (ref 6–20)
CO2: 22 mmol/L (ref 22–32)
Calcium: 9 mg/dL (ref 8.9–10.3)
Chloride: 104 mmol/L (ref 98–111)
Creatinine, Ser: 1.22 mg/dL (ref 0.61–1.24)
GFR calc Af Amer: >60 mL/min (ref >60)
GFR calc non Af Amer: >60 mL/min (ref >60)
Glucose, Bld: 340 mg/dL — ABNORMAL HIGH (ref 70–99)
Potassium: 4.5 mmol/L (ref 3.5–5.1)
Sodium: 133 mmol/L — ABNORMAL LOW (ref 135–145)

## 2018-07-21 LAB — RAPID URINE DRUG SCREEN, HOSP PERFORMED
Amphetamines: NOT DETECTED
Barbiturates: NOT DETECTED
Benzodiazepines: NOT DETECTED
Cocaine: NOT DETECTED
Opiates: NOT DETECTED
Tetrahydrocannabinol: POSITIVE — AB

## 2018-07-21 MED ORDER — ALBUTEROL SULFATE HFA 108 (90 BASE) MCG/ACT IN AERS
2.0000 | INHALATION_SPRAY | Freq: Once | RESPIRATORY_TRACT | Status: AC
  Administered 2018-07-21: 2 via RESPIRATORY_TRACT
  Filled 2018-07-21: qty 6.7

## 2018-07-21 MED ORDER — SODIUM CHLORIDE 0.9 % IV BOLUS
1000.0000 mL | Freq: Once | INTRAVENOUS | Status: AC
  Administered 2018-07-21: 1000 mL via INTRAVENOUS

## 2018-07-21 NOTE — Discharge Instructions (Addendum)
Continue taking all of your home medicines as prescribed.  Call your primary care physician to set up follow-up appointment.  They may be able to help you for your new diabetes medication.  Return to the emergency department if any concerning signs or symptoms develop.

## 2018-07-21 NOTE — ED Provider Notes (Signed)
[facility] DEPT Provider Note   CSN: [id number] Arrival date & time: 07/21/18  1746    History   Chief Complaint Chief Complaint  Patient presents with  . Hyperglycemia    HPI Joshua [name] is a 63 y.o. male with history of bipolar disorder, schizoaffective disorder, hypertension, diabetes, generalized anxiety disorder, cannabis use disorder, chronic pain syndrome presents for evaluation brought in by EMS for hyperglycemia.  Apparently patient walked to the fire station earlier today and asked to get his blood sugar checked.  He was told that his blood sugar was high but then walked home.  Apparently another call went out to his home due to his hyperglycemia.  Patient reports that he has not been able to take his diabetes medications due to financial constraints.  He reports chronic cough and smokes 2 packs of cigarettes daily.  Was seen at urgent care on 07/17/2018 for complaint of chronic cough, prescribed Z-Pak and albuterol nebulizers.  Reports that he has 1 more tablet of azithromycin left.  Reports that his cough has been improving.  He awoke feeling somewhat short of breath this morning but took 2 puffs of his Ventolin inhaler with improvement.  Reports his albuterol inhaler is out.  Denies chest pain, fevers, abdominal pain, nausea, or vomiting.  He is exhibiting some tangential thinking and flight of ideas but has a pleasant affect.     The history is provided by the patient.    Past Medical History:  Diagnosis Date  . Back pain   . Bipolar disorder ([place])   . Cannabis use disorder, moderate, dependence ([place])   . Chronic pain   . Diabetes mellitus without complication ([place])   . Generalized anxiety disorder   . Headache   . Hypertension   . Schizoaffective disorder [facility])     Patient Active Problem List   Diagnosis Date Noted  . Blister of right foot 10/29/2016  . Chronic pain syndrome 10/14/2016  . Osteoarthritis 10/14/2016  . T2DM  (type 2 diabetes mellitus) ([place]) 10/14/2016  . HTN (hypertension) 10/14/2016  . Schizoaffective disorder, bipolar type ([place]) 05/10/2016  . Generalized anxiety disorder 05/04/2016    Past Surgical History:  Procedure Laterality Date  . APPENDECTOMY  1970        Home Medications    Prior to Admission medications   Medication Sig Start Date End Date Taking? Authorizing Provider  albuterol (PROVENTIL) (2.5 MG/3ML) 0.083% nebulizer solution Take 3 mLs (2.5 mg total) by nebulization every 6 (six) hours as needed for wheezing or shortness of breath. 07/12/18  Yes Argentina Donovan, PA-C  Blood Glucose Monitoring Suppl (TRUE METRIX METER) w/Device KIT Check blood sugars fasting and at bedtime 07/12/18  Yes McClung, Angela M, PA-C  budesonide-formoterol (SYMBICORT) 160-4.5 MCG/ACT inhaler Inhale 2 puffs into the lungs 2 (two) times daily. 07/12/18  Yes Freeman Caldron M, PA-C  divalproex (DEPAKOTE ER) 500 MG 24 hr tablet Take 3 tablets by mouth at bedtime. 12/05/17  Yes [provider]  glimepiride (AMARYL) 4 MG tablet Take 1 tablet (4 mg total) by mouth daily with breakfast. 07/17/18  Yes Rodriguez-Southworth, Sunday Spillers, PA-C  glucose blood test strip Use as instructed 07/12/18  Yes McClung, Angela M, PA-C  hydrOXYzine (ATARAX/VISTARIL) 50 MG tablet Take 50 mg by mouth 2 (two) times daily.  06/23/18  Yes [provider]  ipratropium (ATROVENT) 0.02 % nebulizer solution Take 2.5 mLs (0.5 mg total) by nebulization 4 (four) times daily. With or instead of the  Albuterol 07/17/18  Yes Rodriguez-Southworth, Sunday Spillers, PA-C  lisinopril (PRINIVIL,ZESTRIL) 20 MG tablet Take 1 tablet (20 mg total) by mouth daily. 07/17/18  Yes Rodriguez-Southworth, Sunday Spillers, PA-C  prazosin (MINIPRESS) 1 MG capsule Take 1 mg by mouth at bedtime as needed (nightmares).    Yes [provider]  risperiDONE (RISPERDAL) 3 MG tablet Take 3 mg by mouth daily. 06/25/18  Yes [provider]  traZODone (DESYREL) 100  MG tablet Take 200 mg by mouth at bedtime as needed for sleep.  06/26/18  Yes [provider]  TRUEplus Lancets 28G MISC 1 each by Does not apply route 2 (two) times daily. 07/12/18  Yes McClung, Dionne Bucy, PA-C  TRUEplus Lancets 28G MISC 1 each by Does not apply route 2 (two) times daily. 07/12/18  Yes McClung, Dionne Bucy, PA-C  VENTOLIN HFA 108 (90 Base) MCG/ACT inhaler Inhale 2 puffs into the lungs every 4 (four) hours as needed. Patient taking differently: Inhale 2 puffs into the lungs every 4 (four) hours as needed for wheezing or shortness of breath.  07/12/18  Yes Freeman Caldron M, PA-C  azithromycin (ZITHROMAX Z-PAK) 250 MG tablet 2 today, then one qd x 4 days 07/17/18   Rodriguez-Southworth, Sunday Spillers, PA-C    Family History Family History  Problem Relation Age of Onset  . Diabetes Mother   . Cancer Mother        unsure what type  . Heart disease Mother   . Hypertension Mother   . Dementia Father   . Diabetes Brother   . Dementia Maternal Grandfather   . Cancer Paternal Grandmother 7       breast  . Mental illness Neg Hx     Social History Social History   Tobacco Use  . Smoking status: Current Every Day Smoker    Packs/day: 2.00    Types: Cigarettes  . Smokeless tobacco: Never Used  Substance Use Topics  . Alcohol use: Not Currently  . Drug use: Yes    Types: Marijuana    Comment: "have changed to hemp now" 09/17/17     Allergies   Imitrex [sumatriptan]   Review of Systems Review of Systems  Constitutional: Negative for chills and fever.  Respiratory: Positive for cough (improving). Negative for shortness of breath.   Cardiovascular: Negative for chest pain.  Gastrointestinal: Negative for abdominal pain, nausea and vomiting.  All other systems reviewed and are negative.    Physical Exam Updated Vital Signs BP (!) 148/94 (BP Location: Right Arm)   Pulse 86   Temp 98.6 F (37 C) (Oral)   Resp 18   Ht _0  (1.753 m)   Wt 100.2 kg   SpO2 96%   BMI  32.64 kg/m   Physical Exam Vitals signs and nursing note reviewed.  Constitutional:      General: He is not in acute distress.    Appearance: He is well-developed.  HENT:     Head: Normocephalic and atraumatic.  Eyes:     General:        Right eye: No discharge.        Left eye: No discharge.     Conjunctiva/sclera: Conjunctivae normal.  Neck:     Musculoskeletal: Normal range of motion and neck supple.     Vascular: No JVD.     Trachea: No tracheal deviation.  Cardiovascular:     Rate and Rhythm: Normal rate and regular rhythm.     Pulses: Normal pulses.     Heart sounds: Normal  heart sounds.  Pulmonary:     Effort: Pulmonary effort is normal.     Comments: Diffuse expiratory wheezing.  Speaking full sentences without difficulty.  Occasional wet sounding cough Abdominal:     General: Bowel sounds are normal. There is no distension.     Palpations: Abdomen is soft.     Tenderness: There is no abdominal tenderness. There is no guarding or rebound.  Skin:    General: Skin is warm and dry.     Findings: No erythema.  Neurological:     General: No focal deficit present.     Mental Status: He is alert and oriented to person, place, and time.     Cranial Nerves: No cranial nerve deficit.  Psychiatric:        Attention and Perception: He is inattentive.        Mood and Affect: Affect normal.        Speech: Speech is tangential.        Behavior: Behavior is cooperative.        Thought Content: Thought content does not include homicidal or suicidal ideation. Thought content does not include homicidal or suicidal plan.     Comments: Easily redirectable.  Does not appear to be responding to internal stimuli at this time.      ED Treatments / Results  Labs (all labs ordered are listed, but only abnormal results are displayed) Labs Reviewed  BASIC METABOLIC PANEL - Abnormal; Notable for the following components:      Result Value   Sodium 133 (*)    Glucose, Bld 340 (*)     All other components within normal limits  CBC - Abnormal; Notable for the following components:   Platelets 111 (*)    All other components within normal limits  URINALYSIS, ROUTINE W REFLEX MICROSCOPIC - Abnormal; Notable for the following components:   Glucose, UA >=500 (*)    Ketones, ur 5 (*)    All other components within normal limits  RAPID URINE DRUG SCREEN, HOSP PERFORMED - Abnormal; Notable for the following components:   Tetrahydrocannabinol POSITIVE (*)    All other components within normal limits  CBG MONITORING, ED - Abnormal; Notable for the following components:   Glucose-Capillary 363 (*)    All other components within normal limits  CBG MONITORING, ED - Abnormal; Notable for the following components:   Glucose-Capillary 284 (*)    All other components within normal limits  CBG MONITORING, ED    EKG None  Radiology No results found.  Procedures Procedures (including critical care time)  Medications Ordered in ED Medications  sodium chloride 0.9 % bolus 1,000 mL (0 mLs Intravenous Stopped 07/21/18 2100)  albuterol (PROVENTIL HFA;VENTOLIN HFA) 108 (90 Base) MCG/ACT inhaler 2 puff (2 puffs Inhalation Given 07/21/18 1857)     Initial Impression / Assessment and Plan / ED Course  I have reviewed the triage vital signs and the nursing notes.  Pertinent labs & imaging results that were available during my care of the patient were reviewed by me and considered in my medical decision making (see chart for details).        Patient presenting brought in by EMS for hyperglycemia.  He is afebrile, initially mildly tachycardic with improvement on reassessment.  He is nontoxic in appearance.  He appears pleasantly schizophrenic does not appear to be an active threat to himself or others at this time.  He is not currently taking his psych medications.  Initially hyperglycemic with  significant improvement with the administration of IV fluids.  He does not appear to be in  DKA or HHS at this time with normal anion gap.  He does have a cough but reports that it is improving with a Z-Pak and his inhalers at home.  No signs of respiratory distress.  I suspect he is chronically wheezy due to his tobacco abuse.  On reevaluation he is resting comfortably in no apparent distress.  He feels comfortable with discharge home.  Recommend follow-up with PCP for reevaluation of his symptoms and for medication management.  Appears that the urgent care he went to sent his diabetes medications to a pharmacy in which the medication would be cheaper and I informed him of this.  Discussed strict ED return precautions. Pt verbalized understanding of and agreement with plan and is safe for discharge home at this time.   Final Clinical Impressions(s) / ED Diagnoses   Final diagnoses:  Hyperglycemia  Cough    ED Discharge Orders    None       Debroah Baller 07/21/18 2238    Lacretia Leigh, MD 07/22/18 1534

## 2018-07-21 NOTE — ED Triage Notes (Signed)
Pt BIBA from home.  EMS were called to scene for hyperglycemia. EMS states neighbors were reporting erratic behaviour by pt, making tangential statements.  Pt with hx of schizophrenia.

## 2018-07-21 NOTE — ED Notes (Signed)
Pt ambulated to bathroom independently

## 2018-07-21 NOTE — ED Notes (Signed)
Pt able to state where he is, current year, states he knows "you're here to help me."

## 2018-07-21 NOTE — ED Notes (Signed)
Pt denies SI/HI at this time.  Pt states he does not take his anxiety medicine bc it makes him mean.  States that he is fearless, pt with tangential statements.

## 2018-07-21 NOTE — ED Notes (Signed)
Pt given and verbalized understanding of d/c instructions and need for follow up with pcp. Told to return if s/s worsen. No further distress or questions at this time 

## 2018-07-21 NOTE — ED Notes (Signed)
Pt following commands, calm, cooperative.  Pt allowed this RN to take personal knife, give to security for safe keeping.

## 2018-07-24 ENCOUNTER — Encounter (HOSPITAL_COMMUNITY): Payer: Self-pay

## 2018-07-24 ENCOUNTER — Other Ambulatory Visit: Payer: Self-pay

## 2018-07-24 ENCOUNTER — Inpatient Hospital Stay (HOSPITAL_COMMUNITY)
Admission: AD | Admit: 2018-07-24 | Discharge: 2018-07-27 | DRG: 885 | Disposition: A | Discharge status: Home / Self Care | Payer: Medicare Other | Source: Intra-hospital | Attending: Psychiatry | Admitting: Psychiatry

## 2018-07-24 ENCOUNTER — Emergency Department (HOSPITAL_COMMUNITY)
Admission: EM | Admit: 2018-07-24 | Discharge: 2018-07-24 | Disposition: A | Discharge status: Other Acute Inpatient Hospital | Payer: Medicare Other | Attending: Emergency Medicine | Admitting: Emergency Medicine

## 2018-07-24 ENCOUNTER — Encounter (HOSPITAL_COMMUNITY): Payer: Self-pay | Admitting: Oncology

## 2018-07-24 DIAGNOSIS — F250 Schizoaffective disorder, bipolar type: Secondary | ICD-10-CM | POA: Diagnosis not present

## 2018-07-24 DIAGNOSIS — Z79899 Other long term (current) drug therapy: Secondary | ICD-10-CM

## 2018-07-24 DIAGNOSIS — J449 Chronic obstructive pulmonary disease, unspecified: Secondary | ICD-10-CM | POA: Diagnosis present

## 2018-07-24 DIAGNOSIS — Z7951 Long term (current) use of inhaled steroids: Secondary | ICD-10-CM

## 2018-07-24 DIAGNOSIS — Z9114 Patient's other noncompliance with medication regimen: Secondary | ICD-10-CM

## 2018-07-24 DIAGNOSIS — F411 Generalized anxiety disorder: Secondary | ICD-10-CM | POA: Diagnosis present

## 2018-07-24 DIAGNOSIS — F319 Bipolar disorder, unspecified: Secondary | ICD-10-CM | POA: Diagnosis present

## 2018-07-24 DIAGNOSIS — F259 Schizoaffective disorder, unspecified: Principal | ICD-10-CM | POA: Diagnosis present

## 2018-07-24 DIAGNOSIS — Z7984 Long term (current) use of oral hypoglycemic drugs: Secondary | ICD-10-CM | POA: Diagnosis not present

## 2018-07-24 DIAGNOSIS — F17210 Nicotine dependence, cigarettes, uncomplicated: Secondary | ICD-10-CM | POA: Insufficient documentation

## 2018-07-24 DIAGNOSIS — F1220 Cannabis dependence, uncomplicated: Secondary | ICD-10-CM | POA: Insufficient documentation

## 2018-07-24 DIAGNOSIS — I10 Essential (primary) hypertension: Secondary | ICD-10-CM | POA: Insufficient documentation

## 2018-07-24 DIAGNOSIS — E1165 Type 2 diabetes mellitus with hyperglycemia: Secondary | ICD-10-CM | POA: Diagnosis not present

## 2018-07-24 DIAGNOSIS — R739 Hyperglycemia, unspecified: Secondary | ICD-10-CM

## 2018-07-24 DIAGNOSIS — Z888 Allergy status to other drugs, medicaments and biological substances status: Secondary | ICD-10-CM

## 2018-07-24 DIAGNOSIS — G8929 Other chronic pain: Secondary | ICD-10-CM | POA: Diagnosis present

## 2018-07-24 DIAGNOSIS — R062 Wheezing: Secondary | ICD-10-CM

## 2018-07-24 DIAGNOSIS — E119 Type 2 diabetes mellitus without complications: Secondary | ICD-10-CM

## 2018-07-24 LAB — CBC WITH DIFFERENTIAL/PLATELET
Band Neutrophils: 0 %
Basophils Absolute: 0 10*3/uL (ref 0.0–0.1)
Basophils Relative: 0 %
Blasts: 0 %
Eosinophils Absolute: 0.2 10*3/uL (ref 0.0–0.5)
Eosinophils Relative: 2 %
HCT: 42 % (ref 39.0–52.0)
Hemoglobin: 14.3 g/dL (ref 13.0–17.0)
Lymphocytes Relative: 29 %
Lymphs Abs: 2.4 10*3/uL (ref 0.7–4.0)
MCH: 31.8 pg (ref 26.0–34.0)
MCHC: 34 g/dL (ref 30.0–36.0)
MCV: 93.5 fL (ref 80.0–100.0)
MONO ABS: 0.9 10*3/uL (ref 0.1–1.0)
Metamyelocytes Relative: 0 %
Monocytes Relative: 11 %
Myelocytes: 0 %
Neutro Abs: 4.8 10*3/uL (ref 1.7–7.7)
Neutrophils Relative %: 58 %
Other: 0 %
Platelets: 112 10*3/uL — ABNORMAL LOW (ref 150–400)
Promyelocytes Relative: 0 %
RBC: 4.49 MIL/uL (ref 4.22–5.81)
RDW: 12.4 % (ref 11.5–15.5)
WBC: 8.3 10*3/uL (ref 4.0–10.5)
nRBC: 0 % (ref 0.0–0.2)
nRBC: 0 /100 WBC

## 2018-07-24 LAB — COMPREHENSIVE METABOLIC PANEL
ALT: 13 U/L (ref 0–44)
AST: 13 U/L — ABNORMAL LOW (ref 15–41)
Albumin: 3.6 g/dL (ref 3.5–5.0)
Alkaline Phosphatase: 79 U/L (ref 38–126)
Anion gap: 11 (ref 5–15)
BUN: 12 mg/dL (ref 6–20)
CHLORIDE: 97 mmol/L — AB (ref 98–111)
CO2: 22 mmol/L (ref 22–32)
Calcium: 9.3 mg/dL (ref 8.9–10.3)
Creatinine, Ser: 1.1 mg/dL (ref 0.61–1.24)
GFR calc Af Amer: >60 mL/min (ref >60)
GFR calc non Af Amer: >60 mL/min (ref >60)
Glucose, Bld: 360 mg/dL — ABNORMAL HIGH (ref 70–99)
POTASSIUM: 3.7 mmol/L (ref 3.5–5.1)
Sodium: 130 mmol/L — ABNORMAL LOW (ref 135–145)
Total Bilirubin: 0.7 mg/dL (ref 0.3–1.2)
Total Protein: 6.4 g/dL — ABNORMAL LOW (ref 6.5–8.1)

## 2018-07-24 LAB — CBG MONITORING, ED
Glucose-Capillary: 347 mg/dL — ABNORMAL HIGH (ref 70–99)
Glucose-Capillary: 304 mg/dL — ABNORMAL HIGH (ref 70–99)

## 2018-07-24 LAB — ACETAMINOPHEN LEVEL

## 2018-07-24 LAB — RAPID URINE DRUG SCREEN, HOSP PERFORMED
Amphetamines: NOT DETECTED
Barbiturates: NOT DETECTED
Benzodiazepines: NOT DETECTED
COCAINE: NOT DETECTED
Opiates: NOT DETECTED
Tetrahydrocannabinol: POSITIVE — AB

## 2018-07-24 LAB — ETHANOL: Alcohol, Ethyl (B): <10 mg/dL (ref <10)

## 2018-07-24 LAB — VALPROIC ACID LEVEL: Valproic Acid Lvl: 81 ug/mL (ref 50.0–100.0)

## 2018-07-24 LAB — SALICYLATE LEVEL: Salicylate Lvl: <7 mg/dL (ref 2.8–30.0)

## 2018-07-24 MED ORDER — ACETAMINOPHEN 325 MG PO TABS
650.0000 mg | ORAL_TABLET | Freq: Four times a day (QID) | ORAL | Status: DC | PRN
  Administered 2018-07-25 – 2018-07-27 (×2): 650 mg via ORAL
  Filled 2018-07-24 (×2): qty 2

## 2018-07-24 MED ORDER — LISINOPRIL 20 MG PO TABS
20.0000 mg | ORAL_TABLET | Freq: Every day | ORAL | Status: DC
  Administered 2018-07-24: 20 mg via ORAL
  Filled 2018-07-24: qty 1

## 2018-07-24 MED ORDER — PRAZOSIN HCL 1 MG PO CAPS
3.0000 mg | ORAL_CAPSULE | Freq: Every day | ORAL | Status: DC
  Administered 2018-07-24 – 2018-07-25 (×2): 3 mg via ORAL
  Filled 2018-07-24 (×3): qty 3

## 2018-07-24 MED ORDER — GABAPENTIN 300 MG PO CAPS
300.0000 mg | ORAL_CAPSULE | Freq: Three times a day (TID) | ORAL | Status: DC
  Administered 2018-07-24 – 2018-07-27 (×9): 300 mg via ORAL
  Filled 2018-07-24 (×17): qty 1

## 2018-07-24 MED ORDER — MOMETASONE FURO-FORMOTEROL FUM 200-5 MCG/ACT IN AERO
2.0000 | INHALATION_SPRAY | Freq: Two times a day (BID) | RESPIRATORY_TRACT | Status: DC
  Administered 2018-07-24: 2 via RESPIRATORY_TRACT
  Filled 2018-07-24: qty 8.8

## 2018-07-24 MED ORDER — ALUM & MAG HYDROXIDE-SIMETH 200-200-20 MG/5ML PO SUSP: 30.0000 mL | ORAL | Status: DC | PRN

## 2018-07-24 MED ORDER — ALBUTEROL SULFATE (2.5 MG/3ML) 0.083% IN NEBU: 2.5000 mg | INHALATION_SOLUTION | Freq: Four times a day (QID) | RESPIRATORY_TRACT | Status: DC | PRN

## 2018-07-24 MED ORDER — GLIMEPIRIDE 4 MG PO TABS
4.0000 mg | ORAL_TABLET | Freq: Every day | ORAL | Status: DC
  Filled 2018-07-24: qty 1

## 2018-07-24 MED ORDER — MAGNESIUM HYDROXIDE 400 MG/5ML PO SUSP: 30.0000 mL | Freq: Every day | ORAL | Status: DC | PRN

## 2018-07-24 MED ORDER — GLIMEPIRIDE 4 MG PO TABS
4.0000 mg | ORAL_TABLET | Freq: Every day | ORAL | Status: DC
  Administered 2018-07-25 – 2018-07-27 (×3): 4 mg via ORAL
  Filled 2018-07-24 (×5): qty 1

## 2018-07-24 MED ORDER — LISINOPRIL 20 MG PO TABS
20.0000 mg | ORAL_TABLET | Freq: Every day | ORAL | Status: DC
  Administered 2018-07-25 – 2018-07-27 (×3): 20 mg via ORAL
  Filled 2018-07-24 (×5): qty 1

## 2018-07-24 MED ORDER — NICOTINE 21 MG/24HR TD PT24
21.0000 mg | MEDICATED_PATCH | Freq: Every day | TRANSDERMAL | Status: DC
  Administered 2018-07-24: 21 mg via TRANSDERMAL
  Filled 2018-07-24: qty 1

## 2018-07-24 MED ORDER — MOMETASONE FURO-FORMOTEROL FUM 200-5 MCG/ACT IN AERO
2.0000 | INHALATION_SPRAY | Freq: Two times a day (BID) | RESPIRATORY_TRACT | Status: DC
  Administered 2018-07-24 – 2018-07-27 (×6): 2 via RESPIRATORY_TRACT
  Filled 2018-07-24: qty 8.8

## 2018-07-24 MED ORDER — NICOTINE 21 MG/24HR TD PT24
21.0000 mg | MEDICATED_PATCH | Freq: Every day | TRANSDERMAL | Status: DC
  Administered 2018-07-27: 21 mg via TRANSDERMAL
  Filled 2018-07-24 (×5): qty 1

## 2018-07-24 MED ORDER — RISPERIDONE 3 MG PO TABS
3.0000 mg | ORAL_TABLET | Freq: Two times a day (BID) | ORAL | Status: DC
  Administered 2018-07-24 – 2018-07-27 (×6): 3 mg via ORAL
  Filled 2018-07-24 (×10): qty 1

## 2018-07-24 MED ORDER — BENZTROPINE MESYLATE 0.5 MG PO TABS
0.5000 mg | ORAL_TABLET | Freq: Two times a day (BID) | ORAL | Status: DC
  Administered 2018-07-24 – 2018-07-27 (×6): 0.5 mg via ORAL
  Filled 2018-07-24 (×10): qty 1

## 2018-07-24 MED ORDER — RISPERIDONE 3 MG PO TABS
3.0000 mg | ORAL_TABLET | Freq: Once | ORAL | Status: AC
  Administered 2018-07-24: 3 mg via ORAL
  Filled 2018-07-24: qty 1

## 2018-07-24 MED ORDER — ACETAMINOPHEN 325 MG PO TABS: 650.0000 mg | ORAL_TABLET | ORAL | Status: DC | PRN

## 2018-07-24 MED ORDER — LORAZEPAM 1 MG PO TABS
1.0000 mg | ORAL_TABLET | Freq: Four times a day (QID) | ORAL | Status: DC | PRN
  Administered 2018-07-24: 1 mg via ORAL
  Filled 2018-07-24: qty 1

## 2018-07-24 MED ORDER — DIVALPROEX SODIUM ER 500 MG PO TB24: 1500.0000 mg | ORAL_TABLET | Freq: Every day | ORAL | Status: DC

## 2018-07-24 MED ORDER — HYDROXYZINE HCL 50 MG PO TABS
50.0000 mg | ORAL_TABLET | Freq: Two times a day (BID) | ORAL | Status: DC
  Administered 2018-07-24 – 2018-07-27 (×6): 50 mg via ORAL
  Filled 2018-07-24 (×10): qty 1

## 2018-07-24 MED ORDER — ALBUTEROL SULFATE HFA 108 (90 BASE) MCG/ACT IN AERS: 2.0000 | INHALATION_SPRAY | RESPIRATORY_TRACT | Status: DC | PRN

## 2018-07-24 MED ORDER — ONDANSETRON HCL 4 MG PO TABS: 4.0000 mg | ORAL_TABLET | Freq: Three times a day (TID) | ORAL | Status: DC | PRN

## 2018-07-24 MED ORDER — DIVALPROEX SODIUM ER 500 MG PO TB24
1500.0000 mg | ORAL_TABLET | Freq: Every day | ORAL | Status: DC
  Administered 2018-07-24 – 2018-07-26 (×3): 1500 mg via ORAL
  Filled 2018-07-24 (×5): qty 3

## 2018-07-24 MED ORDER — SODIUM CHLORIDE 0.9 % IV BOLUS
1000.0000 mL | Freq: Once | INTRAVENOUS | Status: AC
  Administered 2018-07-24: 1000 mL via INTRAVENOUS
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
## 2018-07-24 NOTE — BH Assessment (Signed)
[facility] Assessment Progress Note   Patient has been accepted to [facility] by Marvia Pickles, NP.  Dr Jake Samples will be the attending MD.  Report will need to be called to [phone].
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
## 2018-07-24 NOTE — Progress Notes (Signed)
Recreation Therapy Notes  INPATIENT RECREATION THERAPY ASSESSMENT  Patient Details Name: Joshua [name] MRN: [id number] DOB: 20-Aug-1955 Today's Date: 07/24/2018       Information Obtained From: Patient  Able to Participate in Assessment/Interview: Yes  Patient Presentation: Alert  Reason for Admission (Per Patient): Other (Comments)(Pt stated "my crazy ass daughter".)  Patient Stressors: (Pt identified no stressors.)  Coping Skills:   Music, Exercise, Substance Abuse  Leisure Interests (2+):  Individual - Other (Comment), Music - Listen, Petra Kuba - Other (Comment)(Spend time with dogs; sit out on the porch; watch Youtube videos of dancing girls)  Frequency of Recreation/Participation: Other (Comment)(Daily)  Awareness of Community Resources:  Yes  Community Resources:  Library, Other (Comment)(Stores)  Current Use: Yes  If no, Barriers?:    Expressed Interest in [place]: No  County of Residence:  [place]  Patient Main Form of Transportation: Musician  Patient Strengths:  Scientist, research (life sciences) cars/body work; Aeronautical engineer  Patient Identified Areas of Improvement:  "Get the hell out of here"  Patient Goal for Hospitalization:  "get out"  Current SI (including self-harm):  No  Current HI:  No  Current AVH: No  Staff Intervention Plan: Group Attendance, Collaborate with Interdisciplinary Treatment Team  Consent to Intern Participation: N/A   [name], LRT/CTRS  [name] A 07/24/2018, 2:21 PM

## 2018-07-24 NOTE — ED Notes (Signed)
Pt.woke up wanting to go home sitting at the foot of bed ,nurse went in to talk to him ibuprofen went in cut the lights on breakfast arrived.set pt. Up.now wants to use the phone.]

## 2018-07-24 NOTE — BHH Counselor (Signed)
Clinician attempted to contact IVC petitioner/pt's daughter Joshua [name], [phone]) to obtain collateral information however the phone continued to ring. Clinician unable to leave voice message.    Vertell Novak, [place], [facility], [facility] Triage Specialist [phone]

## 2018-07-24 NOTE — Tx Team (Signed)
Initial Treatment Plan 07/24/2018 2:33 PM [place] [id number]    PATIENT STRESSORS: Health problems Medication change or noncompliance   PATIENT STRENGTHS: General fund of knowledge Supportive family/friends   PATIENT IDENTIFIED PROBLEMS: "discharge"  Psychosis                   DISCHARGE CRITERIA:  Ability to meet basic life and health needs Adequate post-discharge living arrangements Improved stabilization in mood, thinking, and/or behavior Medical problems require only outpatient monitoring Motivation to continue treatment in a less acute level of care Need for constant or close observation no longer present Reduction of life-threatening or endangering symptoms to within safe limits Safe-care adequate arrangements made Verbal commitment to aftercare and medication compliance  PRELIMINARY DISCHARGE PLAN: Outpatient therapy  PATIENT/FAMILY INVOLVEMENT: This treatment plan has been presented to and reviewed with the patient, Joshua [name].  The patient and family have been given the opportunity to ask questions and make suggestions.  Annia Friendly, RN 07/24/2018, 2:33 PM

## 2018-07-24 NOTE — BH Assessment (Addendum)
[facility] Assessment Progress Note   TTS contacted patient's wife, Joshua [name] [phone], for collateral information.  Wife states that patient is always a little bit weird, but she states that over the past week that he has gotten worse and is becoming increasingly paranoid.  She states that when patient is stressed out that it triggers his psychosis and paranoia.  She states that the [place] situation could be his trigger.  She states that he has stated that, "all the bad people should be killed."  She states that he has become very protective over his family and his property and has been doing bizarre things in his process to protect them.  She states that patient is compliant with his appointments at [facility] and he has been talkng his medication as prescribed.  She states that their have not been any recent changes in his medications.

## 2018-07-24 NOTE — H&P (Signed)
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 a 63 year old patient who is been diagnosed with a schizoaffective condition, who presented to the emergency department on 3/8, 22 days ago stating that he had discontinued his psychiatric medications.  He also presented again on 3/23 with somatic symptoms, 3/27 with hyperglycemia And of course on 3/30 with a exacerbation and underlying psychosis, petition by family members. According to collateral history patient is been obsessed with a coronavirus become increasingly paranoid, making bizarre statements that "all bad people should be killed" and was noted to be making disjointed bizarre statements on his assessment note of 3/30 by Ms. Cannon Kettle And this noted his elaborated that he was jumping from topic to topic, as he is with my exam, stated that when he died "all humanity" would die believe people were tracking him so forth.  Again he was quite paranoid  On my evaluation the patient simply asks for discharge states there is nothing wrong with him that he was moving things out of the house, was going to pain "an [place] stick" and put a wood needle on the porch for decoration so again he rambles gives me irrelevant material and tries to rationalize everything that he is been accused of in the petition papers.  Further he acknowledges long-term cannabis dependency "real heavy" for 5 years.  He fails to see the cannabis may be contributing to his psychotic disorder and he further refuses to  give this up. His current dose of Risperdal is 3 mg at bedtime and Depakote 1500 mg at bedtime  Alert oriented to person place situation time but not date rambling but not pressured in speech, jumps from topic to topic that is generally relevant denies wanting to harm self denies wanting to harm others and denies making the unusual statements listed above.  Denies hallucinations keeps insisting he just mind his own business sits on his porch and smokes marijuana.  Associated Signs/Symptoms: Depression Symptoms:  psychomotor retardation, (Hypo) Manic Symptoms:  Flight of Ideas, Anxiety Symptoms:  Excessive Worry, Psychotic Symptoms:  Paranoia, PTSD Symptoms: NA Total Time spent with patient: 45 minutes  Past Psychiatric History: Sensitive history of intermittent psychosis generally contained with low-dose Risperdal and therapeutic doses of Depakote but also his case is obviously complicated by chronic cannabis dependency  Is the patient at risk to self? Yes.    Has the patient been a risk to self in the past 6 months? No.  Has the patient been a risk to self within the distant past? No.  Is the patient a risk to others? Yes.    Has the patient been a risk to others in the past 6 months? No.  Has the patient been a risk to others within the distant past? No.   Alcohol Screening:   Substance Abuse History in the last 12 months:  Yes.   Consequences of Substance Abuse: NA Previous Psychotropic Medications: Yes  Psychological Evaluations: No  Past Medical History:  Past Medical History:  Diagnosis Date  . Back pain   . Bipolar disorder ([place])   . Cannabis use disorder, moderate, dependence ([place])   .  Chronic pain   . Diabetes mellitus without complication ([place])   . Generalized anxiety disorder   . Headache   . Hypertension   . Schizoaffective disorder [facility])     Past Surgical History:  Procedure Laterality Date  . APPENDECTOMY  1970   Family History:  Family History  Problem  Relation Age of Onset  . Diabetes Mother   . Cancer Mother        unsure what type  . Heart disease Mother   . Hypertension Mother   . Dementia Father   . Diabetes Brother   . Dementia Maternal Grandfather   . Cancer Paternal Grandmother 50       breast  . Mental illness Neg Hx     Tobacco Screening:   Social History:  Social History   Substance and Sexual Activity  Alcohol Use Not Currently     Social History   Substance and Sexual Activity  Drug Use Yes  . Types: Marijuana   Comment: "have changed to hemp now" 09/17/17    Additional Social History:                           Allergies:   Allergies  Allergen Reactions  . Imitrex [Sumatriptan] Other (See Comments)    mania   Lab Results:  Results for orders placed or performed during the hospital encounter of 07/24/18 (from the past 48 hour(s))  CBG monitoring, ED     Status: Abnormal   Collection Time: 07/24/18  3:20 AM  Result Value Ref Range   Glucose-Capillary 347 (H) 70 - 99 mg/dL   Comment 1 Notify RN    Comment 2 Document in Chart   Comprehensive metabolic panel     Status: Abnormal   Collection Time: 07/24/18  3:23 AM  Result Value Ref Range   Sodium 130 (L) 135 - 145 mmol/L   Potassium 3.7 3.5 - 5.1 mmol/L   Chloride 97 (L) 98 - 111 mmol/L   CO2 22 22 - 32 mmol/L   Glucose, Bld 360 (H) 70 - 99 mg/dL   BUN 12 6 - 20 mg/dL   Creatinine, Ser 1.10 0.61 - 1.24 mg/dL   Calcium 9.3 8.9 - 10.3 mg/dL   Total Protein 6.4 (L) 6.5 - 8.1 g/dL   Albumin 3.6 3.5 - 5.0 g/dL   AST 13 (L) 15 - 41 U/L   ALT 13 0 - 44 U/L   Alkaline Phosphatase 79 38 - 126 U/L   Total Bilirubin 0.7 0.3 - 1.2 mg/dL   GFR calc non Af Amer >60 >60 mL/min   GFR calc Af Amer >60 >60 mL/min   Anion gap 11 5 - 15    Comment: Performed at [facility], [address], [place], [place] [address]  Ethanol     Status: None   Collection Time: 07/24/18  3:23 AM  Result Value Ref Range   Alcohol, Ethyl (B) <10 <10 mg/dL     Comment: (NOTE) Lowest detectable limit for serum alcohol is 10 mg/dL. For medical purposes only. Performed at [facility], [place] [address] [place], [place] [address]   CBC with Diff     Status: Abnormal   Collection Time: 07/24/18  3:23 AM  Result Value Ref Range   WBC 8.3 4.0 - 10.5 K/uL   RBC 4.49 4.22 - 5.81 MIL/uL   Hemoglobin 14.3 13.0 - 17.0 g/dL   HCT 42.0 39.0 -  52.0 %   MCV 93.5 80.0 - 100.0 fL   MCH 31.8 26.0 - 34.0 pg   MCHC 34.0 30.0 - 36.0 g/dL   RDW 12.4 11.5 - 15.5 %   Platelets 112 (L) 150 - 400 K/uL    Comment: REPEATED TO VERIFY PLATELET COUNT CONFIRMED BY SMEAR SPECIMEN CHECKED FOR CLOTS Immature Platelet Fraction may be clinically indicated, consider ordering this additional test [id number]    nRBC 0.0 0.0 - 0.2 %   Neutrophils Relative % 58 %   Lymphocytes Relative 29 %   Monocytes Relative 11 %   Eosinophils Relative 2 %   Basophils Relative 0 %   Band Neutrophils 0 %   Metamyelocytes Relative 0 %   Myelocytes 0 %   Promyelocytes Relative 0 %   Blasts 0 %   nRBC 0 0 /100 WBC   Other 0 %   Neutro Abs 4.8 1.7 - 7.7 K/uL   Lymphs Abs 2.4 0.7 - 4.0 K/uL   Monocytes Absolute 0.9 0.1 - 1.0 K/uL   Eosinophils Absolute 0.2 0.0 - 0.5 K/uL   Basophils Absolute 0.0 0.0 - 0.1 K/uL    Comment: Performed at [facility] [address], [place], [place] [address]  Salicylate level     Status: None   Collection Time: 07/24/18  3:23 AM  Result Value Ref Range   Salicylate Lvl <3.0 2.8 - 30.0 mg/dL    Comment: Performed at [facility] [address], [place], [place] [address]  Acetaminophen level     Status: Abnormal   Collection Time: 07/24/18  3:23 AM  Result Value Ref Range   Acetaminophen (Tylenol), Serum <10 (L) 10 - 30 ug/mL    Comment: (NOTE) Therapeutic concentrations vary significantly. A range of 10-30 ug/mL  may be an effective concentration for many patients. However, some  are best treated at concentrations outside of  this range. Acetaminophen concentrations >150 ug/mL at 4 hours after ingestion  and >50 ug/mL at 12 hours after ingestion are often associated with  toxic reactions. Performed at [facility], [place] [address] [place], [place] [address]   POC CBG, ED     Status: Abnormal   Collection Time: 07/24/18  5:07 AM  Result Value Ref Range   Glucose-Capillary 304 (H) 70 - 99 mg/dL   Comment 1 Notify RN    Comment 2 Document in Chart   Urine rapid drug screen (hosp performed)     Status: Abnormal   Collection Time: 07/24/18  5:22 AM  Result Value Ref Range   Opiates NONE DETECTED NONE DETECTED   Cocaine NONE DETECTED NONE DETECTED   Benzodiazepines NONE DETECTED NONE DETECTED   Amphetamines NONE DETECTED NONE DETECTED   Tetrahydrocannabinol POSITIVE (A) NONE DETECTED   Barbiturates NONE DETECTED NONE DETECTED    Comment: (NOTE) DRUG SCREEN FOR MEDICAL PURPOSES ONLY.  IF CONFIRMATION IS NEEDED FOR ANY PURPOSE, NOTIFY LAB WITHIN 5 DAYS. LOWEST DETECTABLE LIMITS FOR URINE DRUG SCREEN Drug Class                     Cutoff (ng/mL) Amphetamine and metabolites    1000 Barbiturate and metabolites    200 Benzodiazepine                 263 Tricyclics and metabolites     300 Opiates and metabolites        300 Cocaine and metabolites  300 THC                            50 Performed at [facility], [place] [address] [place], [place] [address]   Valproic acid level     Status: None   Collection Time: 07/24/18  5:28 AM  Result Value Ref Range   Valproic Acid Lvl 81 50.0 - 100.0 ug/mL    Comment: Performed at [facility] [address], [place], [place] [address]    Blood Alcohol level:  Lab Results  Component Value Date   [facility] <10 07/24/2018   ETH <10 91/69/4503    Metabolic Disorder Labs:  Lab Results  Component Value Date   HGBA1C 11.6 (H) 07/12/2018   MPG 134 09/30/2016   Lab Results  Component Value Date   PROLACTIN 39.4 (H) 09/30/2016   Lab Results   Component Value Date   CHOL 111 09/30/2016   TRIG 176 (H) 09/30/2016   HDL 34 (L) 09/30/2016   CHOLHDL 3.3 09/30/2016   VLDL 35 09/30/2016   LDLCALC 42 09/30/2016    Current Medications: Current Facility-Administered Medications  Medication Dose Route Frequency Provider Last Rate Last Dose  . acetaminophen (TYLENOL) tablet 650 mg  650 mg Oral Q6H PRN Money, [name], FNP      . albuterol (PROVENTIL HFA;VENTOLIN HFA) 108 (90 Base) MCG/ACT inhaler 2 puff  2 puff Inhalation Q4H PRN Money, [name], FNP      . alum & mag hydroxide-simeth (MAALOX/MYLANTA) 200-200-20 MG/5ML suspension 30 mL  30 mL Oral Q4H PRN Money, [name], FNP      . benztropine (COGENTIN) tablet 0.5 mg  0.5 mg Oral BID [name], MD      . divalproex (DEPAKOTE ER) 24 hr tablet 1,500 mg  1,500 mg Oral QHS Money, Travis B, FNP      . gabapentin (NEURONTIN) capsule 300 mg  300 mg Oral TID [name], MD      . Derrill Memo ON 07/25/2018] glimepiride (AMARYL) tablet 4 mg  4 mg Oral Q breakfast Money, [name], FNP      . hydrOXYzine (ATARAX/VISTARIL) tablet 50 mg  50 mg Oral BID Money, [name], FNP      . [START ON 07/25/2018] lisinopril (PRINIVIL,ZESTRIL) tablet 20 mg  20 mg Oral Daily Money, Travis B, FNP      . magnesium hydroxide (MILK OF MAGNESIA) suspension 30 mL  30 mL Oral Daily PRN Money, Darnelle Maffucci B, FNP      . mometasone-formoterol (DULERA) 200-5 MCG/ACT inhaler 2 puff  2 puff Inhalation BID Money, [name], FNP      . [START ON 07/25/2018] nicotine (NICODERM CQ - dosed in mg/24 hours) patch 21 mg  21 mg Transdermal Daily Money, Travis B, FNP      . prazosin (MINIPRESS) capsule 3 mg  3 mg Oral QHS Money, [name], FNP      . risperiDONE (RISPERDAL) tablet 3 mg  3 mg Oral BID Money, [name], FNP       PTA Medications: Medications Prior to Admission  Medication Sig Dispense Refill Last Dose  . albuterol (PROVENTIL) (2.5 MG/3ML) 0.083% nebulizer solution Take 3 mLs (2.5 mg total) by nebulization every 6 (six) hours as  needed for wheezing or shortness of breath. 75 mL 12 Past Week at Unknown time  . azithromycin (ZITHROMAX Z-PAK) 250 MG tablet 2 today, then one qd x 4 days (  Patient not taking: Reported on 07/24/2018) 6 tablet 0 Completed Course at Unknown time  . Blood Glucose Monitoring Suppl (TRUE METRIX METER) w/Device KIT Check blood sugars fasting and at bedtime 1 kit 0   . budesonide-formoterol (SYMBICORT) 160-4.5 MCG/ACT inhaler Inhale 2 puffs into the lungs 2 (two) times daily. 1 Inhaler 3 Past Week at Unknown time  . divalproex (DEPAKOTE ER) 500 MG 24 hr tablet Take 3 tablets by mouth at bedtime.  2 Past Week at Unknown time  . glimepiride (AMARYL) 4 MG tablet Take 1 tablet (4 mg total) by mouth daily with breakfast. 30 tablet 0 Past Week at Unknown time  . glucose blood test strip Use as instructed 100 each 12   . hydrOXYzine (ATARAX/VISTARIL) 50 MG tablet Take 50 mg by mouth 2 (two) times daily.    Past Week at Unknown time  . ipratropium (ATROVENT) 0.02 % nebulizer solution Take 2.5 mLs (0.5 mg total) by nebulization 4 (four) times daily. With or instead of the Albuterol 75 mL 0 Past Week at Unknown time  . lisinopril (PRINIVIL,ZESTRIL) 20 MG tablet Take 1 tablet (20 mg total) by mouth daily. 30 tablet 0 Past Week at Unknown time  . prazosin (MINIPRESS) 1 MG capsule Take 1 mg by mouth at bedtime as needed (nightmares).    Past Week at Unknown time  . risperiDONE (RISPERDAL) 3 MG tablet Take 3 mg by mouth daily.   Past Week at Unknown time  . traZODone (DESYREL) 100 MG tablet Take 200 mg by mouth at bedtime as needed for sleep.    Past Week at Unknown time  . TRUEplus Lancets 28G MISC 1 each by Does not apply route 2 (two) times daily. 100 each 1   . TRUEplus Lancets 28G MISC 1 each by Does not apply route 2 (two) times daily. 100 each 1   . VENTOLIN HFA 108 (90 Base) MCG/ACT inhaler Inhale 2 puffs into the lungs every 4 (four) hours as needed. (Patient taking differently: Inhale 2 puffs into the lungs  every 4 (four) hours as needed for wheezing or shortness of breath. ) 1 Inhaler 0 Past Week at Unknown time    Musculoskeletal: Strength & Muscle Tone: within normal limits Gait & Station: normal Patient leans: N/A  Psychiatric Specialty Exam: Physical Exam  ROS  Blood pressure (!) 147/75, pulse 90, temperature 97.8 F (36.6 C), temperature source Oral, resp. rate 18, height '5\' 9"'$  (1.753 m), weight 100.2 kg, SpO2 98 %.Body mass index is 32.64 kg/m.  General Appearance: Casual  Eye Contact:  Fair  Speech:  Clear and Coherent  Volume:  Decreased  Mood:  Anxious and Dysphoric  Affect:  Congruent  Thought Process:  Irrelevant  Orientation:  Full (Time, Place, and Person)  Thought Content:  Illogical and Delusions  Suicidal Thoughts:  No  Homicidal Thoughts:  No  Memory:  Immediate;   Fair  Judgement:  Poor  Insight:  Shallow  Psychomotor Activity:  Decreased  Concentration:  Concentration: Poor  Recall:  Poor  Fund of Knowledge:  Good  Language:  nl  Akathisia:  Negative  Handed:  Right  AIMS (if indicated):     Assets:  Physical Health Resilience  ADL's:  Intact  Cognition:  WNL  Sleep:       Treatment Plan Summary: Daily contact with patient to assess and evaluate symptoms and progress in treatment and Medication management  Observation Level/Precautions:  15 minute checks  Laboratory:  UDS  Psychotherapy: And reality  based therapy  Medications: Escalate Risperdal  Consultations: Not necessary  Discharge Concerns: Medically stable/long-term abstinence from cannabis  Estimated LOS: 5-7  Other: Consider consider long-acting injectable   Physician Treatment Plan for Primary Diagnosis: <principal problem not specified> Long Term Goal(s): Improvement in symptoms so as ready for discharge  Short Term Goals: Ability to identify triggers associated with substance abuse/mental health issues will improve  Physician Treatment Plan for Secondary Diagnosis: Active  Problems:   Schizoaffective disorder ([place])  Long Term Goal(s): Improvement in symptoms so as ready for discharge  Short Term Goals: Ability to maintain clinical measurements within normal limits will improve  I certify that inpatient services furnished can reasonably be expected to improve the patient's condition.    [name], MD 3/30/20201:16 PM

## 2018-07-24 NOTE — ED Notes (Signed)
Breakfast Tray ordered  

## 2018-07-24 NOTE — ED Triage Notes (Signed)
Pt bib [employer] under IVC petitioned for by pt's family.  Per IVC papers, Respondent talks about spirits and aliens. He thought his daughter was his mother. It's 1 am and he plugs up the radio and turn it full blast.  He talks about hurting others. He carries around a wooden stick to assault others with. He on all type of meds.  Pt is calm at this time.

## 2018-07-24 NOTE — ED Notes (Signed)
Pt talking increasingly agitated about the Coronavirus, suspicous of hospital staff wearing masks.

## 2018-07-24 NOTE — BHH Suicide Risk Assessment (Signed)
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  World Pharmacologist [facility]). Score between 0-7:  no or low risk or alcohol related problems. Score between 8-15:  moderate risk of alcohol related problems. Score between 16-19:  high risk of alcohol related problems. Score 20 or above:  warrants further diagnostic evaluation for alcohol dependence and treatment.   CLINICAL FACTORS:   Bipolar Disorder:   Mixed State  COGNITIVE FEATURES THAT CONTRIBUTE TO RISK:  Loss of executive function    SUICIDE RISK:   Minimal: No identifiable suicidal ideation.  Patients presenting with no risk factors but with morbid ruminations; may be classified as minimal risk based on the severity of the depressive symptoms  PLAN OF CARE: Met for stabilization  I certify that inpatient services furnished can reasonably be expected to improve the patient's condition.   [name], MD 07/24/2018, 1:14 PM
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
## 2018-07-24 NOTE — Progress Notes (Signed)
Inpatient Diabetes Program Recommendations  AACE/ADA: New Consensus Statement on Inpatient Glycemic Control (2015)  Target Ranges:  Prepandial:   less than 140 mg/dL      Peak postprandial:   less than 180 mg/dL (1-2 hours)      Critically ill patients:  140 - 180 mg/dL   Lab Results  Component Value Date   GLUCAP 304 (H) 07/24/2018   HGBA1C 11.6 (H) 07/12/2018   Transferring to [facility]  Review of Glycemic Control  Diabetes history: DM 2 Outpatient Diabetes medications: Amaryl 4 mg Daily Current orders for Inpatient glycemic control: Amaryl 4 mg Daily  Inpatient Diabetes Program Recommendations:    A1c 11.6% at PCP visit at the [facility], was off medications prior top visit.   Glucose currently in the 300's. Consider CBGs and Novolog 0-15 units tid + Novolog 0-5 units qhs.  If glucose remain elevated after Amaryl and Novolog Correction doses may consider weight based low dose basal insulin.  Patient will need close PCP follow up for DM management due to elevated A1c level.  Thanks,  Tama Headings RN, MSN, BC-ADM Inpatient Diabetes Coordinator Team Pager [phone] (8a-5p)

## 2018-07-24 NOTE — ED Notes (Signed)
Spoke with Joshua [name] at poison control,  Case closed at this time

## 2018-07-24 NOTE — Progress Notes (Signed)
Patient ID: [name], male   DOB: 01/18/1956, 63 y.o.   MRN: [id number]  Admission Note  D) Patient admitted to the adult unit 500 hall. Patient is a 63 year old male under IVC from [facility]. Patient presents with some tangential speech and flight of ideas but was pleasant and cooperative during the admission process. Patient states his daughter committed him "for moving some damn stuff around". Patient does endorse, "I got too high" and states he "smoked some marijuana and took a few Gabapentin". Patient reports, "I'm fine now, I'm not high anymore". Patient with PMH of HTN and DM but states he does not use insulin at home. Patient remarks that he remembers staff from his previous admissions here. Patient is a poor historian of his psychiatric history and states, "screw my family, I just want to go live in the woods, you know?" Patient denies any current SI/HI/AVH.  Skin assessment was completed and unremarkable except for dry/brittle skin. Patient belongings searched with no contraband found. Belongings secured in locker. Vital signs obtained.   A) Plan of care, unit policies and patient expectations were explained. Written consents obtained. Patient oriented to the unit and their room. Patient placed on standard q15 safety checks. High fall risk precautions initiated and reviewed with patient.   R) Patient is in no acute distress and verbalizes understanding of information provided. Patient with no concerns at this time. Patient contracts for safety with staff on the unit. Report given to receiving RN.

## 2018-07-25 DIAGNOSIS — I10 Essential (primary) hypertension: Secondary | ICD-10-CM

## 2018-07-25 LAB — GLUCOSE, CAPILLARY
GLUCOSE-CAPILLARY: 370 mg/dL — AB (ref 70–99)
Glucose-Capillary: 456 mg/dL — ABNORMAL HIGH (ref 70–99)
Glucose-Capillary: 570 mg/dL (ref 70–99)
Glucose-Capillary: 531 mg/dL (ref 70–99)
Glucose-Capillary: 404 mg/dL — ABNORMAL HIGH (ref 70–99)

## 2018-07-25 MED ORDER — METFORMIN HCL 500 MG PO TABS
500.0000 mg | ORAL_TABLET | Freq: Two times a day (BID) | ORAL | Status: DC
  Administered 2018-07-25 – 2018-07-27 (×2): 500 mg via ORAL
  Filled 2018-07-25 (×8): qty 1

## 2018-07-25 MED ORDER — INSULIN ASPART 100 UNIT/ML ~~LOC~~ SOLN
0.0000 [IU] | Freq: Three times a day (TID) | SUBCUTANEOUS | Status: DC
  Administered 2018-07-25: 15 [IU] via SUBCUTANEOUS
  Administered 2018-07-26 (×2): 3 [IU] via SUBCUTANEOUS
  Administered 2018-07-26 – 2018-07-27 (×2): 11 [IU] via SUBCUTANEOUS

## 2018-07-25 MED ORDER — INSULIN ASPART 100 UNIT/ML ~~LOC~~ SOLN: 5.0000 [IU] | Freq: Once | SUBCUTANEOUS | Status: DC

## 2018-07-25 MED ORDER — KETOROLAC TROMETHAMINE 60 MG/2ML IM SOLN
60.0000 mg | Freq: Once | INTRAMUSCULAR | Status: AC
  Administered 2018-07-25: 60 mg via INTRAMUSCULAR
  Filled 2018-07-25 (×2): qty 2

## 2018-07-25 MED ORDER — INSULIN GLARGINE 100 UNIT/ML ~~LOC~~ SOLN
15.0000 [IU] | Freq: Every day | SUBCUTANEOUS | Status: DC
  Administered 2018-07-25: 15 [IU] via SUBCUTANEOUS

## 2018-07-25 MED ORDER — INSULIN ASPART 100 UNIT/ML ~~LOC~~ SOLN
12.0000 [IU] | Freq: Once | SUBCUTANEOUS | Status: AC
  Administered 2018-07-25: 12 [IU] via SUBCUTANEOUS

## 2018-07-25 MED ORDER — INSULIN ASPART 100 UNIT/ML ~~LOC~~ SOLN: 0.0000 [IU] | Freq: Every day | SUBCUTANEOUS | Status: DC

## 2018-07-25 NOTE — BHH Group Notes (Signed)
[name] LCSW Group Therapy Note  Date/Time: 07/25/18, 1100  Type of Therapy/Topic:  Group Therapy:  Feelings about Diagnosis  Participation Level:  Active   Mood:pleasant   Description of Group:    This group will allow patients to explore their thoughts and feelings about diagnoses they have received. Patients will be guided to explore their level of understanding and acceptance of these diagnoses. Facilitator will encourage patients to process their thoughts and feelings about the reactions of others to their diagnosis, and will guide patients in identifying ways to discuss their diagnosis with significant others in their lives. This group will be process-oriented, with patients participating in exploration of their own experiences as well as giving and receiving support and challenge from other group members.   Therapeutic Goals: 1. Patient will demonstrate understanding of diagnosis as evidence by identifying two or more symptoms of the disorder:  2. Patient will be able to express two feelings regarding the diagnosis 3. Patient will demonstrate ability to communicate their needs through discussion and/or role plays  Summary of Patient Progress: Pt very engaged and active in group today, shared that his diagnosis is bipolar disorder and was able to identify several symptoms.  Pt had some trouble with interrupting other patients and had to be redirected several times, but overall did well.        Therapeutic Modalities:   Cognitive Behavioral Therapy Brief Therapy Feelings Identification   Lurline Idol, LCSW

## 2018-07-25 NOTE — Plan of Care (Addendum)
D: Patient is up in the day room for most of the evening. Patient is alert, pleasant, and cooperative. Denies SI, HI, AVH, and verbally contracts for safety. Patient reports generalized aching and numbness in his fingers. He is focused on discharge. Patient is minimal with answers to assessment questions.   CBG at 2000 was 404. Called provider and one time order for 12 units of novolog obtained to replace sliding scale novolog for the evening.   At [place] woke yelling and came out of his room reporting pain in his shins. This RN asked him to have a seat and he did. He denied any other physical symptoms but kept yelling in pain. CBG and vitals obtained. CBG was 190, an improvement from other recent CBGs, and vitals were in normal ranges for Joshua [name]. There were no PRN medications for this situation so I contacted the on call provider who ordered a one time dose of 50 mg amitriptyline suspecting neuropathic pain which I administered. Joshua [name]'s pain eventually subsided and he went back to sleep.    A: Medications administered per MD order. Support provided. Patient educated on safety on the unit and medications. Routine safety checks every 15 minutes. Patient stated understanding to tell nurse about any new physical symptoms. Patient understands to tell staff of any needs.     R: No adverse drug reactions noted. Patient verbally contracts for safety. Patient remains safe at this time and will continue to monitor.   Problem: Education: Goal: Knowledge of [facility] Education information/materials will improve Outcome: Progressing   Problem: Safety: Goal: Periods of time without injury will increase Outcome: Progressing  Patient is oriented to the unit and remains safe. Will continue to monitor.

## 2018-07-25 NOTE — Progress Notes (Signed)
Recreation Therapy Notes  Date: 3.31.20 Time: 1000 Location: 500 Hall Dayroom  Group Topic: Wellness  Goal Area(s) Addresses:  Patient will define components of whole wellness. Patient will verbalize benefit of whole wellness.  Behavioral Response: Engaged  Intervention: [employer], Music, Stopwatch  Activity: Keep It Chartered certified accountant.  Patients were seated in a semi circle, at an arms length apart.  Patients were to toss the ball back and forth to each other.  Patients would be timed to see how long they could keep the ball going.  The ball could bounce off the floor but if it stopped at any point the ball stopped moving, the timer would be reset.  Education: Wellness, Dentist.   Education Outcome: Acknowledges education/In group clarification offered/Needs additional education.   Clinical Observations/Feedback: Pt expressed that he wasn't feeling well and wasn't going to participate.  Pt eventually joined in with the activity and participated for the duration of group.  Pt stated he was glad he participated and was feeling a little better.    [name], LRT/CTRS    [name] A 07/25/2018 10:43 AM

## 2018-07-25 NOTE — Progress Notes (Signed)
Inpatient Diabetes Program Recommendations  AACE/ADA: New Consensus Statement on Inpatient Glycemic Control (2015)  Target Ranges:  Prepandial:   less than 140 mg/dL      Peak postprandial:   less than 180 mg/dL (1-2 hours)      Critically ill patients:  140 - 180 mg/dL   Lab Results  Component Value Date   GLUCAP 570 (HH) 07/25/2018   HGBA1C 11.6 (H) 07/12/2018    Review of Glycemic Control  CBG 570 mg/dL at 1417. Per RN pt refusing insulin. Pt does not appear toxic according to RN.   Inpatient Diabetes Program Recommendations:     BMET in am.  Pt states he will take his insulin now.  MD ordered basal/bolus insulin -  Lantus 15 units QAM Novolog 0-15 units tidwc and hs Amaryl 4 mg QAM Metformin 500 mg bid  Talked with Dr. Parke Poisson and Wyline Copas regarding above.  Will follow daily.  Thank you. Lorenda Peck, RD, LDN, CDE Inpatient Diabetes Coordinator [phone]

## 2018-07-25 NOTE — Progress Notes (Signed)
Inpatient Diabetes Program Recommendations  AACE/ADA: New Consensus Statement on Inpatient Glycemic Control (2015)  Target Ranges:  Prepandial:   less than 140 mg/dL      Peak postprandial:   less than 180 mg/dL (1-2 hours)      Critically ill patients:  140 - 180 mg/dL   Lab Results  Component Value Date   GLUCAP 370 (H) 07/25/2018   HGBA1C 11.6 (H) 07/12/2018    Review of Glycemic Control  Blood sugars consistently in 300s. Needs insulin adjustment.  Inpatient Diabetes Program Recommendations:     Add Lantus 15 units Q24H Add Novolog 0-15 units tidwc and hs Will need f/u with PCP for HgbA1C of 11.6% at discharge  If post-prandials continue to be elevated, add meal coverage insulin.  Will follow daily.  Thank you. Lorenda Peck, RD, LDN, CDE Inpatient Diabetes Coordinator [phone]

## 2018-07-25 NOTE — Consult Note (Signed)
Medical Consultation   Joshua [name]  IWL:[id number]  DOB: Apr 07, 1956  DOA: 07/24/2018  PCP: Patient, No Pcp Per   Requesting physician: Dr. Parke Poisson  Reason for consultation:  Hyperglycemia   History of Present Illness: Joshua [name] is an 63 y.o. male with hx of schizoaffective disorder, marijuana abuse presented to behavioral health for management of schizoaffective disorder after not taking his psychiatric medications. Pt noted to have long-standing hx of poorly controlled diabetes on Amaryl. While on unit, patient noted to have markedly uncontrolled glucose in the 300's up to just under 600. Diabetic coordinator consulted with recommendations for starting Lantus 15 units and SSI coverage, however pt refused insulin, stating he did not trust his current physicians.  Blood work from 3/30 demonstrated a random glucose of 360 with anion gap of 11 (up from 7). Per staff, patient asymptomatic and in no distress. Pt was recently seen in ED on 3/27 for hyperglycemia, found to not be in DKA, observed, then discharged  Review of Systems:  As per HPI otherwise 10 point review of systems negative.   Past Medical History: Past Medical History:  Diagnosis Date  . Back pain   . Bipolar disorder ([place])   . Cannabis use disorder, moderate, dependence ([place])   . Chronic pain   . Diabetes mellitus without complication ([place])   . Generalized anxiety disorder   . Headache   . Hypertension   . Schizoaffective disorder [facility])     Past Surgical History: Past Surgical History:  Procedure Laterality Date  . APPENDECTOMY  1970    Allergies:   Allergies  Allergen Reactions  . Imitrex [Sumatriptan] Other (See Comments)    mania    Social History:  reports that he has been smoking cigarettes. He has been smoking about 2.00 packs per day. He has never used smokeless tobacco. He reports previous alcohol use. He reports current drug use. Drug: Marijuana.  Family History: Family  History  Problem Relation Age of Onset  . Diabetes Mother   . Cancer Mother        unsure what type  . Heart disease Mother   . Hypertension Mother   . Dementia Father   . Diabetes Brother   . Dementia Maternal Grandfather   . Cancer Paternal Grandmother 38       breast  . Mental illness Neg Hx     Physical Exam: Vitals:   07/24/18 1210 07/24/18 1212 07/24/18 1958  BP: (!) 147/79 (!) 147/75 124/80  Pulse: 91 90 82  Resp: 18    Temp: 97.8 F (36.6 C)    TempSrc: Oral    SpO2: 98%    Weight: 100.2 kg    Height: 5\' 9"  (1.753 m)      Chart reviewed, case discussed with attending physician, vitals and history reviewed  Data reviewed:  I have personally reviewed following labs and imaging studies Labs:  CBC: Recent Labs  Lab 07/21/18 1852 07/24/18 0323  WBC 5.2 8.3  NEUTROABS  --  4.8  HGB 13.9 14.3  HCT 40.7 42.0  MCV 95.5 93.5  PLT 111* 112*    Basic Metabolic Panel: Recent Labs  Lab 07/21/18 1852 07/24/18 0323  NA 133* 130*  K 4.5 3.7  CL 104 97*  CO2 22 22  GLUCOSE 340* 360*  BUN 16 12  CREATININE 1.22 1.10  CALCIUM 9.0 9.3   GFR Estimated Creatinine Clearance: 83.3 mL/min (  by C-G formula based on SCr of 1.1 mg/dL). Liver Function Tests: Recent Labs  Lab 07/24/18 0323  AST 13*  ALT 13  ALKPHOS 79  BILITOT 0.7  PROT 6.4*  ALBUMIN 3.6   No results for input(s): LIPASE, AMYLASE in the last 168 hours. No results for input(s): AMMONIA in the last 168 hours. Coagulation profile No results for input(s): INR, PROTIME in the last 168 hours.  Cardiac Enzymes: No results for input(s): CKTOTAL, CKMB, CKMBINDEX, TROPONINI in the last 168 hours. BNP: Invalid input(s): POCBNP CBG: Recent Labs  Lab 07/24/18 0320 07/24/18 0507 07/25/18 0705 07/25/18 0957 07/25/18 1417  GLUCAP 347* 304* 370* 456* 570*   D-Dimer No results for input(s): DDIMER in the last 72 hours. Hgb A1c No results for input(s): HGBA1C in the last 72 hours. Lipid Profile  No results for input(s): CHOL, HDL, LDLCALC, TRIG, CHOLHDL, LDLDIRECT in the last 72 hours. Thyroid function studies No results for input(s): TSH, T4TOTAL, T3FREE, THYROIDAB in the last 72 hours.  Invalid input(s): FREET3 Anemia work up No results for input(s): VITAMINB12, FOLATE, FERRITIN, TIBC, IRON, RETICCTPCT in the last 72 hours. Urinalysis    Component Value Date/Time   COLORURINE YELLOW 07/21/2018 1859   APPEARANCEUR CLEAR 07/21/2018 1859   LABSPEC 1.029 07/21/2018 1859   PHURINE 6.0 07/21/2018 1859   GLUCOSEU >=500 (A) 07/21/2018 1859   HGBUR NEGATIVE 07/21/2018 1859   BILIRUBINUR NEGATIVE 07/21/2018 1859   KETONESUR 5 (A) 07/21/2018 1859   PROTEINUR NEGATIVE 07/21/2018 1859   UROBILINOGEN 0.2 09/19/2017 1400   NITRITE NEGATIVE 07/21/2018 1859   LEUKOCYTESUR NEGATIVE 07/21/2018 1859    Microbiology No results found for this or any previous visit (from the past 240 hour(s)).  Inpatient Medications:   Scheduled Meds: . benztropine  0.5 mg Oral BID  . divalproex  1,500 mg Oral QHS  . gabapentin  300 mg Oral TID  . glimepiride  4 mg Oral Q breakfast  . hydrOXYzine  50 mg Oral BID  . insulin aspart  0-15 Units Subcutaneous TID WC  . insulin aspart  0-5 Units Subcutaneous QHS  . insulin aspart  5 Units Subcutaneous Once  . insulin glargine  15 Units Subcutaneous Daily  . lisinopril  20 mg Oral Daily  . metFORMIN  500 mg Oral BID WC  . mometasone-formoterol  2 puff Inhalation BID  . nicotine  21 mg Transdermal Daily  . prazosin  3 mg Oral QHS  . risperiDONE  3 mg Oral BID   Continuous Infusions:   Radiological Exams on Admission: No results found.  Impression/Recommendations Principal Problem:   Schizoaffective disorder ([place]) Active Problems:   T2DM (type 2 diabetes mellitus) ([place])   HTN (hypertension)  Poorly controlled DM2 -Glucose trends reviewed. Very poorly controlled with A1c in excess of 11 -Diabetic Coordinator recs noted and reviewed. Will place  order for 15 units lantus with SSI coverage as recommended -Pt had been refusing insulin, however, now is agreeable -Most recent anion gap noted to be 11. Will repeat BMET in AM. If patient rules in for DKA, would then consider transfer to medical unit and initiating insulin gtt at that time -Will change diet from regular to carb mod, if possible -Will continue to monitor glucose trends and titrate insulin as needed to achieve euglycemia  HTN -Vital signs reviewed.  -BP stable at this time  Schizoaffective disorder -Continue per primary service  Thank you for this consultation.  Our [facility] hospitalist team will follow the patient with you.  Time Spent: 30 min reviewing chart and coordinating care with primary provider  Marylu Lund M.D. [facility] 07/25/2018, 4:33 PM

## 2018-07-25 NOTE — Progress Notes (Signed)
[place] had CBG check this afternoon and was 570, called nurse practitioner for insulin orders however Mareo refused all attempts at insulin and kept talking about how he wants to be let out so he can see his doctor so he can he get his insulin from his doctor. He spoke that he can't trust the doctor here to prescribe him the right medication. Education was attempted and [facility] refused all attempts at education.
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
## 2018-07-25 NOTE — Progress Notes (Signed)
Patient ID: Joshua [name], male   DOB: 11-26-1955, 63 y.o.   MRN: [id number] 07/25/2018 at 4:30 PM.  Reviewed CBGs with hospitalist consultant, Dr. Sherrian Divers.  Patient started on basal/Lantus insulin and NovoLog sliding scale with meals and at bedtime.  I met with patient and reviewed the importance of compliance with diabetic management and risk associated with refusing insulin, including risk of going into DKA and even death.  Patient reported he would comply with recommended insulin management.   [name] MD

## 2018-07-25 NOTE — Progress Notes (Signed)
[facility] MD Progress Note  07/25/2018 10:50 AM Joshua [name]  MRN:  [id number] Subjective:    Patient seen multiple times because of his intrusiveness simply demands an interview every time I see him and is angry when he is redirected and informed that I cannot provide him with interview after interview particularly when I am in the room another patient or speaking with another patient Patient also continues to argue with a very hospitalization and commitment stating he was minding his own business, that he does not need to be here. He is focused on obtaining something for pain He is focused on his diabetes but refusing any insulin No amount of attention will satisfy him irritable argumentative and requesting discharge repeatedly requesting repeated interviews Fails to see this intrusiveness and hypomania as pathology and continues to argue he should not be here  Principal Problem: Chronic cannabis dependency/chronic and probable schizoaffective condition that has been exacerbated Diagnosis: Active Problems:   Schizoaffective disorder ([place])  Total Time spent with patient: 20 minutes  Past Medical History:  Past Medical History:  Diagnosis Date  . Back pain   . Bipolar disorder ([place])   . Cannabis use disorder, moderate, dependence ([place])   . Chronic pain   . Diabetes mellitus without complication ([place])   . Generalized anxiety disorder   . Headache   . Hypertension   . Schizoaffective disorder [facility])     Past Surgical History:  Procedure Laterality Date  . APPENDECTOMY  1970   Family History:  Family History  Problem Relation Age of Onset  . Diabetes Mother   . Cancer Mother        unsure what type  . Heart disease Mother   . Hypertension Mother   . Dementia Father   . Diabetes Brother   . Dementia Maternal Grandfather   . Cancer Paternal Grandmother 62       breast  . Mental illness Neg Hx     Social History:  Social History   Substance and Sexual Activity  Alcohol  Use Not Currently     Social History   Substance and Sexual Activity  Drug Use Yes  . Types: Marijuana   Comment: "have changed to hemp now" 09/17/17    Social History   Socioeconomic History  . Marital status: Married    Spouse name: Not on file  . Number of children: Not on file  . Years of education: Not on file  . Highest education level: Not on file  Occupational History  . Not on file  Social Needs  . Financial resource strain: Not on file  . Food insecurity:    Worry: Not on file    Inability: Not on file  . Transportation needs:    Medical: Not on file    Non-medical: Not on file  Tobacco Use  . Smoking status: Current Every Day Smoker    Packs/day: 2.00    Types: Cigarettes  . Smokeless tobacco: Never Used  Substance and Sexual Activity  . Alcohol use: Not Currently  . Drug use: Yes    Types: Marijuana    Comment: "have changed to hemp now" 09/17/17  . Sexual activity: Not on file  Lifestyle  . Physical activity:    Days per week: Not on file    Minutes per session: Not on file  . Stress: Not on file  Relationships  . Social connections:    Talks on phone: Not on file    Gets together: Not  on file    Attends religious service: Not on file    Active member of club or organization: Not on file    Attends meetings of clubs or organizations: Not on file    Relationship status: Not on file  Other Topics Concern  . Not on file  Social History Narrative  . Not on file   Additional Social History:                         Sleep: Good  Appetite:  Good  Current Medications: Current Facility-Administered Medications  Medication Dose Route Frequency Provider Last Rate Last Dose  . acetaminophen (TYLENOL) tablet 650 mg  650 mg Oral Q6H PRN Money, [name], FNP      . albuterol (PROVENTIL HFA;VENTOLIN HFA) 108 (90 Base) MCG/ACT inhaler 2 puff  2 puff Inhalation Q4H PRN Money, [name], FNP      . alum & mag hydroxide-simeth (MAALOX/MYLANTA)  200-200-20 MG/5ML suspension 30 mL  30 mL Oral Q4H PRN Money, Darnelle Maffucci B, FNP      . benztropine (COGENTIN) tablet 0.5 mg  0.5 mg Oral BID [name], MD   0.5 mg at 07/25/18 0817  . divalproex (DEPAKOTE ER) 24 hr tablet 1,500 mg  1,500 mg Oral QHS Money, Travis B, FNP   1,500 mg at 07/24/18 2047  . gabapentin (NEURONTIN) capsule 300 mg  300 mg Oral TID [name], MD   300 mg at 07/25/18 0815  . glimepiride (AMARYL) tablet 4 mg  4 mg Oral Q breakfast Money, [name], FNP   4 mg at 07/25/18 0816  . hydrOXYzine (ATARAX/VISTARIL) tablet 50 mg  50 mg Oral BID Money, [name], FNP   50 mg at 07/25/18 0816  . insulin aspart (novoLOG) injection 5 Units  5 Units Subcutaneous Once [name], NP      . ketorolac (TORADOL) injection 60 mg  60 mg Intramuscular Once [name], MD      . lisinopril (PRINIVIL,ZESTRIL) tablet 20 mg  20 mg Oral Daily Money, [name], FNP   20 mg at 07/25/18 0816  . magnesium hydroxide (MILK OF MAGNESIA) suspension 30 mL  30 mL Oral Daily PRN Money, Darnelle Maffucci B, FNP      . metFORMIN (GLUCOPHAGE) tablet 500 mg  500 mg Oral BID WC [name], MD      . mometasone-formoterol [facility]) 200-5 MCG/ACT inhaler 2 puff  2 puff Inhalation BID Money, [name], FNP   2 puff at 07/25/18 0815  . nicotine (NICODERM CQ - dosed in mg/24 hours) patch 21 mg  21 mg Transdermal Daily Money, Darnelle Maffucci B, FNP      . prazosin (MINIPRESS) capsule 3 mg  3 mg Oral QHS Money, [name], FNP   3 mg at 07/24/18 2046  . risperiDONE (RISPERDAL) tablet 3 mg  3 mg Oral BID Money, [name], FNP   3 mg at 07/25/18 3016    Lab Results:  Results for orders placed or performed during the hospital encounter of 07/24/18 (from the past 48 hour(s))  Glucose, capillary     Status: Abnormal   Collection Time: 07/25/18  7:05 AM  Result Value Ref Range   Glucose-Capillary 370 (H) 70 - 99 mg/dL  Glucose, capillary     Status: Abnormal   Collection Time: 07/25/18  9:57 AM  Result Value Ref Range   Glucose-Capillary  456 (H) 70 - 99 mg/dL   Comment 1 Notify RN  Blood Alcohol level:  Lab Results  Component Value Date   ETH <10 07/24/2018   ETH <10 29/52/8413    Metabolic Disorder Labs: Lab Results  Component Value Date   HGBA1C 11.6 (H) 07/12/2018   MPG 134 09/30/2016   Lab Results  Component Value Date   PROLACTIN 39.4 (H) 09/30/2016   Lab Results  Component Value Date   CHOL 111 09/30/2016   TRIG 176 (H) 09/30/2016   HDL 34 (L) 09/30/2016   CHOLHDL 3.3 09/30/2016   VLDL 35 09/30/2016   LDLCALC 42 09/30/2016    Physical Findings: AIMS: Facial and Oral Movements Muscles of Facial Expression: None, normal Lips and Perioral Area: None, normal Jaw: None, normal Tongue: None, normal,Extremity Movements Upper (arms, wrists, hands, fingers): None, normal Lower (legs, knees, ankles, toes): None, normal, Trunk Movements Neck, shoulders, hips: None, normal, Overall Severity Severity of abnormal movements (highest score from questions above): None, normal Incapacitation due to abnormal movements: None, normal Patient's awareness of abnormal movements (rate only patient's report): No Awareness, Dental Status Current problems with teeth and/or dentures?: No Does patient usually wear dentures?: No  CIWA:    COWS:     Musculoskeletal: Strength & Muscle Tone: within normal limits Gait & Station: normal Patient leans: N/A  Psychiatric Specialty Exam: Physical Exam  ROS  Blood pressure 124/80, pulse 82, temperature 97.8 F (36.6 C), temperature source Oral, resp. rate 18, height 5\' 9"  (1.753 m), weight 100.2 kg, SpO2 98 %.Body mass index is 32.64 kg/m.  General Appearance: Disheveled  Eye Contact:  Minimal  Speech:  Pressured  Volume:  Increased  Mood:  Anxious and Irritable  Affect:  Congruent  Thought Process:  Irrelevant  Orientation:  Full (Time, Place, and Person)  Thought Content:  Illogical, Paranoid Ideation and Tangential  Suicidal Thoughts:  No  Homicidal Thoughts:   No  Memory:  Immediate;   Fair  Judgement:  Impaired  Insight:  Lacking  Psychomotor Activity:  Normal  Concentration:  Concentration: Fair  Recall:  Good  Fund of Knowledge:  Good  Language:  Good  Akathisia:  Negative  Handed:  Right  AIMS (if indicated):     Assets:  Physical Health Resilience  ADL's:  Intact  Cognition:  WNL  Sleep:  Number of Hours: 6.25     Treatment Plan Summary: Daily contact with patient to assess and evaluate symptoms and progress in treatment, Medication management and Plan Continue mood stabilizer and antipsychotic therapy add Glucophage for hyperglycemia add Toradol for pain continue reality-based therapy med and illness education  [name], MD 07/25/2018, 10:50 AM

## 2018-07-25 NOTE — [facility] Suicide Risk Assessment (Signed)
[place] INPATIENT:  Family/Significant Other Suicide Prevention Education  Suicide Prevention Education:  Education Completed; Joshua [name], wife, [phone], has been identified by the patient as the family member/significant other with whom the patient will be residing, and identified as the person(s) who will aid the patient in the event of a mental health crisis (suicidal ideations/suicide attempt).  With written consent from the patient, the family member/significant other has been provided the following suicide prevention education, prior to the and/or following the discharge of the patient.  The suicide prevention education provided includes the following:  Suicide risk factors  Suicide prevention and interventions  National Suicide Hotline telephone number  [facility] assessment telephone number  [facility] Emergency Assistance [place] and/or Residential Mobile Crisis Unit telephone number  Request made of family/significant other to:  Remove weapons (e.g., guns, rifles, knives), all items previously/currently identified as safety concern.  Wife reports they do have guns, the guns are in a locked safe.  CSW encouraged her to remove guns.  Wife reports pt does not have access to the safe.   Remove drugs/medications (over-the-counter, prescriptions, illicit drugs), all items previously/currently identified as a safety concern.  The family member/significant other verbalizes understanding of the suicide prevention education information provided.  The family member/significant other agrees to remove the items of safety concern listed above.  Wife reports pt has "lots of strange, crazy thoughts" that have been worse the past few weeks.  Pt has been doing bizarre things--moving stuff out into the yard, saying he is fighting the corona virus.  Pt got very angry with daughter because daughter mowed the grass.  The police were called to the home recently and pt  first told them he was guardian angel, then the prince of darkness, then told them his wallet had special powers.  Pt is on his medication and has been taking it.  Pt is also smoking marijuana daily, and has been off and on for a long time.    Joshua Chars, LCSW 07/25/2018, 3:39 PM

## 2018-07-26 LAB — BASIC METABOLIC PANEL
Anion gap: 10 (ref 5–15)
BUN: 20 mg/dL (ref 6–20)
CHLORIDE: 94 mmol/L — AB (ref 98–111)
CO2: 23 mmol/L (ref 22–32)
Calcium: 8.8 mg/dL — ABNORMAL LOW (ref 8.9–10.3)
Creatinine, Ser: 1.07 mg/dL (ref 0.61–1.24)
GFR calc non Af Amer: >60 mL/min (ref >60)
Glucose, Bld: 189 mg/dL — ABNORMAL HIGH (ref 70–99)
Potassium: 4.5 mmol/L (ref 3.5–5.1)
Sodium: 127 mmol/L — ABNORMAL LOW (ref 135–145)

## 2018-07-26 LAB — GLUCOSE, CAPILLARY
Glucose-Capillary: 158 mg/dL — ABNORMAL HIGH (ref 70–99)
Glucose-Capillary: 190 mg/dL — ABNORMAL HIGH (ref 70–99)
Glucose-Capillary: 193 mg/dL — ABNORMAL HIGH (ref 70–99)
Glucose-Capillary: 200 mg/dL — ABNORMAL HIGH (ref 70–99)
Glucose-Capillary: 301 mg/dL — ABNORMAL HIGH (ref 70–99)

## 2018-07-26 MED ORDER — MOMETASONE FURO-FORMOTEROL FUM 200-5 MCG/ACT IN AERO: 2.0000 | INHALATION_SPRAY | Freq: Two times a day (BID) | RESPIRATORY_TRACT | Status: DC

## 2018-07-26 MED ORDER — INSULIN GLARGINE 100 UNIT/ML ~~LOC~~ SOLN
18.0000 [IU] | Freq: Every day | SUBCUTANEOUS | Status: DC
  Administered 2018-07-26: 18 [IU] via SUBCUTANEOUS

## 2018-07-26 MED ORDER — PROPRANOLOL HCL 20 MG PO TABS
20.0000 mg | ORAL_TABLET | Freq: Two times a day (BID) | ORAL | Status: DC
  Administered 2018-07-26 – 2018-07-27 (×3): 20 mg via ORAL
  Filled 2018-07-26 (×7): qty 1
  Filled 2018-07-26: qty 2

## 2018-07-26 MED ORDER — PRAZOSIN HCL 2 MG PO CAPS
2.0000 mg | ORAL_CAPSULE | Freq: Every day | ORAL | Status: DC
  Administered 2018-07-26: 21:00:00 2 mg via ORAL
  Filled 2018-07-26 (×3): qty 1

## 2018-07-26 MED ORDER — ALBUTEROL SULFATE (2.5 MG/3ML) 0.083% IN NEBU: 2.5000 mg | INHALATION_SOLUTION | Freq: Four times a day (QID) | RESPIRATORY_TRACT | Status: DC | PRN

## 2018-07-26 MED ORDER — AMITRIPTYLINE HCL 50 MG PO TABS
50.0000 mg | ORAL_TABLET | Freq: Once | ORAL | Status: AC
  Administered 2018-07-26: 01:00:00 50 mg via ORAL
  Filled 2018-07-26: qty 1
  Filled 2018-07-26: qty 2

## 2018-07-26 MED ORDER — PALIPERIDONE PALMITATE ER 156 MG/ML IM SUSY
156.0000 mg | PREFILLED_SYRINGE | Freq: Once | INTRAMUSCULAR | Status: AC
  Administered 2018-07-26: 156 mg via INTRAMUSCULAR

## 2018-07-26 NOTE — Progress Notes (Signed)
Recreation Therapy Notes  Date: 4.1.20 Time: 1000 Location: 500 Hall Dayroom  Group Topic: Coping Skills  Goal Area(s) Addresses:  Patient will identify positive and negative coping skills. Patient will identify benefits of positive coping skills. Patient will identify consequences of negative coping skills.  Behavioral Response: Engaged  Intervention:  Worksheet, pencils  Activity:  Healthy vs. Unhealthy Coping Strategies.  Patients were to identify a problem they are currently dealing with.  Patients then identified the unhealthy coping strategies they used when dealing with that problem and the consequences.  Patients also identified positive coping strategies, the benefits and the barriers that prevent them from using these coping strategies.  Education: Radiographer, therapeutic, Dentist.   Education Outcome: Acknowledges understanding/In group clarification offered/Needs additional education.   Clinical Observations/Feedback: Pt stated his problem was dealing with his diabetes and controlling it.  Pt expressed his unhealthy coping strategy was eating too much sugar and not eating healthy.  Pt stated eating the wrong things makes his body hurt.  Pt identified healthy coping strategies as exercise and better diet.  Pt stated the expected outcome from the healthy strategies were "getting stronger and better".  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      [name], LRT/CTRS    [name] A 07/26/2018 10:49 AM

## 2018-07-26 NOTE — Progress Notes (Signed)
[facility] MD Progress Note  07/26/2018 10:24 AM Joshua [name]  MRN:  [id number] Subjective:    Patient is less intrusive but still intrusive all the same he is focused on discharge he does seem indeed much better with the Risperdal therapy.  He does not have thoughts of harming himself or others insist he should never been hospitalized all he does is "sit on his porch on bother no one" he does acknowledge cannabis dependency that may have altered his mood however No EPS no TD Agrees to long-acting injectable Intermittently compliant with diabetes recommendations Principal Problem: Schizoaffective disorder ([place]) Diagnosis: Principal Problem:   Schizoaffective disorder ([place]) Active Problems:   T2DM (type 2 diabetes mellitus) ([place])   HTN (hypertension)  Total Time spent with patient: 20 minutes  Past Medical History:  Past Medical History:  Diagnosis Date  . Back pain   . Bipolar disorder ([place])   . Cannabis use disorder, moderate, dependence ([place])   . Chronic pain   . Diabetes mellitus without complication ([place])   . Generalized anxiety disorder   . Headache   . Hypertension   . Schizoaffective disorder [facility])     Past Surgical History:  Procedure Laterality Date  . APPENDECTOMY  1970   Family History:  Family History  Problem Relation Age of Onset  . Diabetes Mother   . Cancer Mother        unsure what type  . Heart disease Mother   . Hypertension Mother   . Dementia Father   . Diabetes Brother   . Dementia Maternal Grandfather   . Cancer Paternal Grandmother 57       breast  . Mental illness Neg Hx    Social History:  Social History   Substance and Sexual Activity  Alcohol Use Not Currently     Social History   Substance and Sexual Activity  Drug Use Yes  . Types: Marijuana   Comment: "have changed to hemp now" 09/17/17    Social History   Socioeconomic History  . Marital status: Married    Spouse name: Not on file  . Number of children: Not on file  .  Years of education: Not on file  . Highest education level: Not on file  Occupational History  . Not on file  Social Needs  . Financial resource strain: Not on file  . Food insecurity:    Worry: Not on file    Inability: Not on file  . Transportation needs:    Medical: Not on file    Non-medical: Not on file  Tobacco Use  . Smoking status: Current Every Day Smoker    Packs/day: 2.00    Types: Cigarettes  . Smokeless tobacco: Never Used  Substance and Sexual Activity  . Alcohol use: Not Currently  . Drug use: Yes    Types: Marijuana    Comment: "have changed to hemp now" 09/17/17  . Sexual activity: Not on file  Lifestyle  . Physical activity:    Days per week: Not on file    Minutes per session: Not on file  . Stress: Not on file  Relationships  . Social connections:    Talks on phone: Not on file    Gets together: Not on file    Attends religious service: Not on file    Active member of club or organization: Not on file    Attends meetings of clubs or organizations: Not on file    Relationship status: Not on file  Other Topics Concern  . Not on file  Social History Narrative  . Not on file   Additional Social History:                         Sleep: Good  Appetite:  Good  Current Medications: Current Facility-Administered Medications  Medication Dose Route Frequency Provider Last Rate Last Dose  . acetaminophen (TYLENOL) tablet 650 mg  650 mg Oral Q6H PRN Money, [name], FNP   650 mg at 07/25/18 2102  . albuterol (PROVENTIL HFA;VENTOLIN HFA) 108 (90 Base) MCG/ACT inhaler 2 puff  2 puff Inhalation Q4H PRN Money, [name], FNP      . albuterol (PROVENTIL) (2.5 MG/3ML) 0.083% nebulizer solution 2.5 mg  2.5 mg Nebulization Q6H PRN [name], MD      . alum & mag hydroxide-simeth (MAALOX/MYLANTA) 200-200-20 MG/5ML suspension 30 mL  30 mL Oral Q4H PRN Money, [name], FNP      . benztropine (COGENTIN) tablet 0.5 mg  0.5 mg Oral BID [name], MD    0.5 mg at 07/26/18 0758  . divalproex (DEPAKOTE ER) 24 hr tablet 1,500 mg  1,500 mg Oral QHS Money, Travis B, FNP   1,500 mg at 07/25/18 2048  . gabapentin (NEURONTIN) capsule 300 mg  300 mg Oral TID [name], MD   300 mg at 07/26/18 0756  . glimepiride (AMARYL) tablet 4 mg  4 mg Oral Q breakfast Money, [name], FNP   4 mg at 07/26/18 0756  . hydrOXYzine (ATARAX/VISTARIL) tablet 50 mg  50 mg Oral BID Money, [name], FNP   50 mg at 07/26/18 0757  . insulin aspart (novoLOG) injection 0-15 Units  0-15 Units Subcutaneous TID WC [name], MD   11 Units at 07/26/18 [phone]  . insulin aspart (novoLOG) injection 0-5 Units  0-5 Units Subcutaneous QHS [name], MD      . insulin aspart (novoLOG) injection 5 Units  5 Units Subcutaneous Once [name], NP      . insulin glargine (LANTUS) injection 18 Units  18 Units Subcutaneous Daily [name], MD   18 Units at 07/26/18 [phone]  . lisinopril (PRINIVIL,ZESTRIL) tablet 20 mg  20 mg Oral Daily Money, [name], FNP   20 mg at 07/26/18 0757  . magnesium hydroxide (MILK OF MAGNESIA) suspension 30 mL  30 mL Oral Daily PRN Money, Darnelle Maffucci B, FNP      . metFORMIN (GLUCOPHAGE) tablet 500 mg  500 mg Oral BID WC [name], MD   500 mg at 07/25/18 1655  . mometasone-formoterol (DULERA) 200-5 MCG/ACT inhaler 2 puff  2 puff Inhalation BID Money, [name], FNP   2 puff at 07/25/18 2047  . nicotine (NICODERM CQ - dosed in mg/24 hours) patch 21 mg  21 mg Transdermal Daily Money, Darnelle Maffucci B, FNP      . prazosin (MINIPRESS) capsule 3 mg  3 mg Oral QHS Money, [name], FNP   3 mg at 07/25/18 2048  . risperiDONE (RISPERDAL) tablet 3 mg  3 mg Oral BID Money, [name], FNP   3 mg at 07/26/18 8882    Lab Results:  Results for orders placed or performed during the hospital encounter of 07/24/18 (from the past 48 hour(s))  Glucose, capillary     Status: Abnormal   Collection Time: 07/25/18  7:05 AM  Result Value Ref Range   Glucose-Capillary 370 (H) 70  - 99  mg/dL  Glucose, capillary     Status: Abnormal   Collection Time: 07/25/18  9:57 AM  Result Value Ref Range   Glucose-Capillary 456 (H) 70 - 99 mg/dL   Comment 1 Notify RN   Glucose, capillary     Status: Abnormal   Collection Time: 07/25/18  2:17 PM  Result Value Ref Range   Glucose-Capillary 570 (HH) 70 - 99 mg/dL   Comment 1 Notify RN   Glucose, capillary     Status: Abnormal   Collection Time: 07/25/18  4:53 PM  Result Value Ref Range   Glucose-Capillary 531 (HH) 70 - 99 mg/dL   Comment 1 Notify RN   Glucose, capillary     Status: Abnormal   Collection Time: 07/25/18  7:56 PM  Result Value Ref Range   Glucose-Capillary 404 (H) 70 - 99 mg/dL  Glucose, capillary     Status: Abnormal   Collection Time: 07/26/18 12:38 AM  Result Value Ref Range   Glucose-Capillary 190 (H) 70 - 99 mg/dL  Glucose, capillary     Status: Abnormal   Collection Time: 07/26/18  6:23 AM  Result Value Ref Range   Glucose-Capillary 301 (H) 70 - 99 mg/dL    Blood Alcohol level:  Lab Results  Component Value Date   ETH <10 07/24/2018   ETH <10 84/13/2440    Metabolic Disorder Labs: Lab Results  Component Value Date   HGBA1C 11.6 (H) 07/12/2018   MPG 134 09/30/2016   Lab Results  Component Value Date   PROLACTIN 39.4 (H) 09/30/2016   Lab Results  Component Value Date   CHOL 111 09/30/2016   TRIG 176 (H) 09/30/2016   HDL 34 (L) 09/30/2016   CHOLHDL 3.3 09/30/2016   VLDL 35 09/30/2016   LDLCALC 42 09/30/2016    Physical Findings: AIMS: Facial and Oral Movements Muscles of Facial Expression: None, normal Lips and Perioral Area: None, normal Jaw: None, normal Tongue: None, normal,Extremity Movements Upper (arms, wrists, hands, fingers): None, normal Lower (legs, knees, ankles, toes): None, normal, Trunk Movements Neck, shoulders, hips: None, normal, Overall Severity Severity of abnormal movements (highest score from questions above): None, normal Incapacitation due to abnormal  movements: None, normal Patient's awareness of abnormal movements (rate only patient's report): No Awareness, Dental Status Current problems with teeth and/or dentures?: No Does patient usually wear dentures?: No  CIWA:    COWS:     Musculoskeletal: Strength & Muscle Tone: within normal limits Gait & Station: normal Patient leans: N/A  Psychiatric Specialty Exam: Physical Exam  ROS  Blood pressure (!) 141/78, pulse 85, temperature (!) 97.3 F (36.3 C), temperature source Oral, resp. rate 20, height 5\' 9"  (1.753 m), weight 100.2 kg, SpO2 98 %.Body mass index is 32.64 kg/m.  General Appearance: Casual and Disheveled  Eye Contact:  Good  Speech:  Clear and Coherent  Volume:  Normal  Mood:  Euthymic  Affect:  Constricted and Restricted  Thought Process:  Irrelevant  Orientation:  Full (Time, Place, and Person)  Thought Content:  Tangential  Suicidal Thoughts:  No  Homicidal Thoughts:  No  Memory:  Immediate;   Fair  Judgement:  Fair  Insight:  Fair  Psychomotor Activity:  Decreased  Concentration:  Concentration: Fair  Recall:  [employer] of Knowledge:  Fair  Language:  Fair  Akathisia:  Negative  Handed:  Right  AIMS (if indicated):     Assets:  Housing Leisure Time Physical Health Social Support  ADL's:  Intact  Cognition:  WNL  Sleep:  Number of Hours: 5     Treatment Plan Summary: Daily contact with patient to assess and evaluate symptoms and progress in treatment, Medication management and Plan Long-acting injectable today probable discharge tomorrow Patient encouraged to comply with diabetes management to include supplemental insulin when needed Michele Kerlin, MD 07/26/2018, 10:24 AM

## 2018-07-26 NOTE — Progress Notes (Signed)
Inpatient Diabetes Program Recommendations  AACE/ADA: New Consensus Statement on Inpatient Glycemic Control (2015)  Target Ranges:  Prepandial:   less than 140 mg/dL      Peak postprandial:   less than 180 mg/dL (1-2 hours)      Critically ill patients:  140 - 180 mg/dL   Lab Results  Component Value Date   GLUCAP 158 (H) 07/26/2018   HGBA1C 11.6 (H) 07/12/2018    Review of Glycemic Control  Spoke with [place] RN,  Alyssa, taking care of Joshua [name]. Discussed pt going home on basal insulin and OHAs. Had first visit at [facility] on 07/12/2018 to establish care. Seen in ED on 07/02/2018 with blood sugar of 561. Was sent home on Amaryl 1 mg QD. Does not have glucometer.  Will need to have f/u appt at [facility] within the next week.   Inpatient Diabetes Program Recommendations:     Care manager consult for medication assistance. Pt should be able to get meds at [facility] since he is an established pt.   Increase Lantus to 20 units QAM if FBS > 180 mg/dL. Will need prescription for glucose meter and supplies  Meter - #[id number] Lantus Solostar pen  Will teach insulin pen administration in am at [facility].  Thank you. Lorenda Peck, RD, LDN, CDE Inpatient Diabetes Coordinator [phone]

## 2018-07-26 NOTE — Tx Team (Signed)
Interdisciplinary Treatment and Diagnostic Plan Update  07/26/2018 Time of Session: 0859 KEM HENSEN MRN: [id number]  Principal Diagnosis: Schizoaffective disorder [name])  Secondary Diagnoses: Principal Problem:   Schizoaffective disorder ([name]) Active Problems:   T2DM (type 2 diabetes mellitus) ([place])   HTN (hypertension)   Current Medications:  Current Facility-Administered Medications  Medication Dose Route Frequency Provider Last Rate Last Dose  . acetaminophen (TYLENOL) tablet 650 mg  650 mg Oral Q6H PRN Money, [name], FNP   650 mg at 07/25/18 2102  . albuterol (PROVENTIL HFA;VENTOLIN HFA) 108 (90 Base) MCG/ACT inhaler 2 puff  2 puff Inhalation Q4H PRN Money, [name], FNP      . albuterol (PROVENTIL) (2.5 MG/3ML) 0.083% nebulizer solution 2.5 mg  2.5 mg Nebulization Q6H PRN [name], MD      . alum & mag hydroxide-simeth (MAALOX/MYLANTA) 200-200-20 MG/5ML suspension 30 mL  30 mL Oral Q4H PRN Money, [name], FNP      . benztropine (COGENTIN) tablet 0.5 mg  0.5 mg Oral BID [name], MD   0.5 mg at 07/26/18 0758  . divalproex (DEPAKOTE ER) 24 hr tablet 1,500 mg  1,500 mg Oral QHS Money, Travis B, FNP   1,500 mg at 07/25/18 2048  . gabapentin (NEURONTIN) capsule 300 mg  300 mg Oral TID [name], MD   300 mg at 07/26/18 1204  . glimepiride (AMARYL) tablet 4 mg  4 mg Oral Q breakfast Money, [name], FNP   4 mg at 07/26/18 0756  . hydrOXYzine (ATARAX/VISTARIL) tablet 50 mg  50 mg Oral BID Money, [name], FNP   50 mg at 07/26/18 0757  . insulin aspart (novoLOG) injection 0-15 Units  0-15 Units Subcutaneous TID WC [name], MD   3 Units at 07/26/18 1204  . insulin aspart (novoLOG) injection 0-5 Units  0-5 Units Subcutaneous QHS [name], MD      . insulin aspart (novoLOG) injection 5 Units  5 Units Subcutaneous Once [name], NP      . insulin glargine (LANTUS) injection 18 Units  18 Units Subcutaneous Daily [name], MD   18 Units at  07/26/18 [phone]  . lisinopril (PRINIVIL,ZESTRIL) tablet 20 mg  20 mg Oral Daily Money, [name], FNP   20 mg at 07/26/18 0757  . magnesium hydroxide (MILK OF MAGNESIA) suspension 30 mL  30 mL Oral Daily PRN Money, Darnelle Maffucci B, FNP      . metFORMIN (GLUCOPHAGE) tablet 500 mg  500 mg Oral BID WC [name], MD   500 mg at 07/25/18 1655  . mometasone-formoterol (DULERA) 200-5 MCG/ACT inhaler 2 puff  2 puff Inhalation BID Money, [name], FNP   2 puff at 07/25/18 2047  . nicotine (NICODERM CQ - dosed in mg/24 hours) patch 21 mg  21 mg Transdermal Daily Money, Darnelle Maffucci B, FNP      . prazosin (MINIPRESS) capsule 2 mg  2 mg Oral QHS [name], MD      . propranolol (INDERAL) tablet 20 mg  20 mg Oral BID [name], MD   20 mg at 07/26/18 1204  . risperiDONE (RISPERDAL) tablet 3 mg  3 mg Oral BID Money, [name], FNP   3 mg at 07/26/18 3825   PTA Medications: Medications Prior to Admission  Medication Sig Dispense Refill Last Dose  . albuterol (PROVENTIL) (2.5 MG/3ML) 0.083% nebulizer solution Take 3 mLs (2.5 mg total) by nebulization every 6 (six) hours as needed for  wheezing or shortness of breath. 75 mL 12 Past Week at Unknown time  . azithromycin (ZITHROMAX Z-PAK) 250 MG tablet 2 today, then one qd x 4 days (Patient not taking: Reported on 07/24/2018) 6 tablet 0 Completed Course at Unknown time  . Blood Glucose Monitoring Suppl (TRUE METRIX METER) w/Device KIT Check blood sugars fasting and at bedtime 1 kit 0   . budesonide-formoterol (SYMBICORT) 160-4.5 MCG/ACT inhaler Inhale 2 puffs into the lungs 2 (two) times daily. 1 Inhaler 3 Past Week at Unknown time  . divalproex (DEPAKOTE ER) 500 MG 24 hr tablet Take 3 tablets by mouth at bedtime.  2 Past Week at Unknown time  . glimepiride (AMARYL) 4 MG tablet Take 1 tablet (4 mg total) by mouth daily with breakfast. 30 tablet 0 Past Week at Unknown time  . glucose blood test strip Use as instructed 100 each 12   . hydrOXYzine (ATARAX/VISTARIL) 50 MG tablet  Take 50 mg by mouth 2 (two) times daily.    Past Week at Unknown time  . ipratropium (ATROVENT) 0.02 % nebulizer solution Take 2.5 mLs (0.5 mg total) by nebulization 4 (four) times daily. With or instead of the Albuterol 75 mL 0 Past Week at Unknown time  . lisinopril (PRINIVIL,ZESTRIL) 20 MG tablet Take 1 tablet (20 mg total) by mouth daily. 30 tablet 0 Past Week at Unknown time  . prazosin (MINIPRESS) 1 MG capsule Take 1 mg by mouth at bedtime as needed (nightmares).    Past Week at Unknown time  . risperiDONE (RISPERDAL) 3 MG tablet Take 3 mg by mouth daily.   Past Week at Unknown time  . traZODone (DESYREL) 100 MG tablet Take 200 mg by mouth at bedtime as needed for sleep.    Past Week at Unknown time  . TRUEplus Lancets 28G MISC 1 each by Does not apply route 2 (two) times daily. 100 each 1   . TRUEplus Lancets 28G MISC 1 each by Does not apply route 2 (two) times daily. 100 each 1   . VENTOLIN HFA 108 (90 Base) MCG/ACT inhaler Inhale 2 puffs into the lungs every 4 (four) hours as needed. (Patient taking differently: Inhale 2 puffs into the lungs every 4 (four) hours as needed for wheezing or shortness of breath. ) 1 Inhaler 0 Past Week at Unknown time    Patient Stressors: Health problems Medication change or noncompliance  Patient Strengths: General fund of knowledge Supportive family/friends  Treatment Modalities: Medication Management, Group therapy, Case management,  1 to 1 session with clinician, Psychoeducation, Recreational therapy.   Physician Treatment Plan for Primary Diagnosis: Schizoaffective disorder ([place]) Long Term Goal(s): Improvement in symptoms so as ready for discharge Improvement in symptoms so as ready for discharge   Short Term Goals: Ability to identify triggers associated with substance abuse/mental health issues will improve Ability to maintain clinical measurements within normal limits will improve  Medication Management: Evaluate patient's response, side  effects, and tolerance of medication regimen.  Therapeutic Interventions: 1 to 1 sessions, Unit Group sessions and Medication administration.  Evaluation of Outcomes: Progressing  Physician Treatment Plan for Secondary Diagnosis: Principal Problem:   Schizoaffective disorder ([place]) Active Problems:   T2DM (type 2 diabetes mellitus) ([place])   HTN (hypertension)  Long Term Goal(s): Improvement in symptoms so as ready for discharge Improvement in symptoms so as ready for discharge   Short Term Goals: Ability to identify triggers associated with substance abuse/mental health issues will improve Ability to maintain clinical measurements within normal  limits will improve     Medication Management: Evaluate patient's response, side effects, and tolerance of medication regimen.  Therapeutic Interventions: 1 to 1 sessions, Unit Group sessions and Medication administration.  Evaluation of Outcomes: Progressing   RN Treatment Plan for Primary Diagnosis: Schizoaffective disorder ([place]) Long Term Goal(s): Knowledge of disease and therapeutic regimen to maintain health will improve  Short Term Goals: Ability to identify and develop effective coping behaviors will improve and Compliance with prescribed medications will improve  Medication Management: RN will administer medications as ordered by provider, will assess and evaluate patient's response and provide education to patient for prescribed medication. RN will report any adverse and/or side effects to prescribing provider.  Therapeutic Interventions: 1 on 1 counseling sessions, Psychoeducation, Medication administration, Evaluate responses to treatment, Monitor vital signs and CBGs as ordered, Perform/monitor CIWA, COWS, AIMS and Fall Risk screenings as ordered, Perform wound care treatments as ordered.  Evaluation of Outcomes: Progressing   LCSW Treatment Plan for Primary Diagnosis: Schizoaffective disorder ([place]) Long Term Goal(s): Safe  transition to appropriate next level of care at discharge, Engage patient in therapeutic group addressing interpersonal concerns.  Short Term Goals: Engage patient in aftercare planning with referrals and resources, Identify triggers associated with mental health/substance abuse issues and Increase skills for wellness and recovery  Therapeutic Interventions: Assess for all discharge needs, 1 to 1 time with Social worker, Explore available resources and support systems, Assess for adequacy in community support network, Educate family and significant other(s) on suicide prevention, Complete Psychosocial Assessment, Interpersonal group therapy.  Evaluation of Outcomes: Progressing   Progress in Treatment: Attending groups: Yes. Participating in groups: Yes. Taking medication as prescribed: Yes. Toleration medication: Yes. Family/Significant other contact made: Yes, individual(s) contacted:  wife Patient understands diagnosis: No. Discussing patient identified problems/goals with staff: Yes. Medical problems stabilized or resolved: Yes. Denies suicidal/homicidal ideation: Yes. Issues/concerns per patient self-inventory: No. Other: none  New problem(s) identified: No, Describe:  none  New Short Term/Long Term Goal(s):  Patient Goals:  "do better"  Discharge Plan or Barriers:   Reason for Continuation of Hospitalization: Delusions  Medication stabilization  Estimated Length of Stay: 1-3 days.  Attendees: Patient: Joshua [name] 07/26/2018   Physician: Dr Jake Samples, MD 07/26/2018   Nursing: Megan Mans, RN 07/26/2018   RN Care Manager: 07/26/2018   Social Worker: Lurline Idol, LCSW 07/26/2018   Recreational Therapist:  07/26/2018   Other:  07/26/2018   Other:  07/26/2018   Other: 07/26/2018     Scribe for Treatment Team: Joanne Chars, [place] 07/26/2018 12:51 PM

## 2018-07-26 NOTE — Progress Notes (Signed)
Nursing Progress Note: 7p-7a D: Pt currently presents with a pleasant affect and behavior. Pt states "I want to go home and be with my grand kids. They ar my life and being away from this is hard." Interacting appropriately with the milieu. Pt reports good sleep during the previous night with current medication regimen. Pt did attend wrap-up group.  A: Pt provided with medications per providers orders. Pt's labs and vitals were monitored throughout the night. Pt supported emotionally and encouraged to express concerns and questions. Pt educated on medications.  R: Pt's safety ensured with 15 minute and environmental checks. Pt currently denies SI, HI, and AVH. Pt verbally contracts to seek staff if SI,HI, or AVH occurs and to consult with staff before acting on any harmful thoughts. Will continue to monitor.

## 2018-07-26 NOTE — Progress Notes (Signed)
PROGRESS NOTE    KROSBY RITCHIE  ZOX:[id number] DOB: Nov 12, 1955 DOA: 07/24/2018 PCP: Patient, No Pcp Per   Brief Narrative:  HPI per Dr Tennis Must is an 63 y.o. male with hx of schizoaffective disorder, marijuana abuse presented to behavioral health for management of schizoaffective disorder after not taking his psychiatric medications. Pt noted to have long-standing hx of poorly controlled diabetes on Amaryl. While on unit, patient noted to have markedly uncontrolled glucose in the 300's up to just under 600. Diabetic coordinator consulted with recommendations for starting Lantus 15 units and SSI coverage, however pt refused insulin, stating he did not trust his current physicians.  Blood work from 3/30 demonstrated a random glucose of 360 with anion gap of 11 (up from 7). Per staff, patient asymptomatic and in no distress. Pt was recently seen in ED on 3/27 for hyperglycemia, found to not be in DKA, observed, then discharged    Assessment & Plan:   Principal Problem:   Schizoaffective disorder ([place]) Active Problems:   T2DM (type 2 diabetes mellitus) ([place])   HTN (hypertension)  1 poorly controlled type 2 diabetes mellitus Hemoglobin A1c 11.6 on 07/12/2018.  CBG yesterday afternoon was 531 however basic metabolic profile with a anion gap of 10.  Patient noted to be asymptomatic.Patient noted to be noncompliant witrh medications prior to admission.CBGs improved.  Increase Lantus to 18 units daily.  Continue Amaryl and metformin.  Will likely need to be discharged on Lantus.  Will need close outpatient follow-up with PCP.  Will need insulin pen training.  Discussed with diabetic coordinator.  2.  Hypertension Stable.  Continue home regimen lisinopril.  3.  Schizoaffective disorder Per primary team.  4.  History of bronchospasms Continue Dulera.   DVT prophylaxis: Per primary team Code Status: Full Family Communication: Updated patient.  No family at bedside.  Disposition Plan: Per primary team.   Consultants:   Internal medicine: Dr. Wyline Copas 07/25/2018  Procedures:   None  Antimicrobials:   None   Subjective: In room.  Denies any chest pain or shortness of breath.  States he is feeling better as his blood sugars have been better controlled.  States he feels he is going to be discharged tomorrow.  States he is going to be compliant with his medications.  Objective: Vitals:   07/26/18 0044 07/26/18 0625 07/26/18 0626 07/26/18 1159  BP: (!) 145/79 (!) 145/93 (!) 141/78 (!) 148/98  Pulse: 86 78 85 80  Resp: 20 20    Temp: 97.6 F (36.4 C) (!) 97.3 F (36.3 C)  97.8 F (36.6 C)  TempSrc: Oral Oral  Oral  SpO2:      Weight:      Height:       No intake or output data in the 24 hours ending 07/26/18 1530 Filed Weights   07/24/18 1210  Weight: 100.2 kg    Examination:  General exam: Appears calm and comfortable  Respiratory system: Some coarse breath sounds diffusely.  No wheezing noted.  No crackles noted. Respiratory effort normal. Cardiovascular system: S1 & S2 heard, RRR. No JVD, murmurs, rubs, gallops or clicks. No pedal edema. Gastrointestinal system: Abdomen is nondistended, soft and nontender. No organomegaly or masses felt. Normal bowel sounds heard. Central nervous system: Alert and oriented. No focal neurological deficits. Extremities: Symmetric 5 x 5 power. Skin: No rashes, lesions or ulcers Psychiatry: Judgement and insight appear normal. Mood & affect appropriate.     Data Reviewed: I have personally reviewed following  labs and imaging studies  CBC: Recent Labs  Lab 07/21/18 1852 07/24/18 0323  WBC 5.2 8.3  NEUTROABS  --  4.8  HGB 13.9 14.3  HCT 40.7 42.0  MCV 95.5 93.5  PLT 111* 161*   Basic Metabolic Panel: Recent Labs  Lab 07/21/18 1852 07/24/18 0323  NA 133* 130*  K 4.5 3.7  CL 104 97*  CO2 22 22  GLUCOSE 340* 360*  BUN 16 12  CREATININE 1.22 1.10  CALCIUM 9.0 9.3   GFR: Estimated  Creatinine Clearance: 83.3 mL/min (by C-G formula based on SCr of 1.1 mg/dL). Liver Function Tests: Recent Labs  Lab 07/24/18 0323  AST 13*  ALT 13  ALKPHOS 79  BILITOT 0.7  PROT 6.4*  ALBUMIN 3.6   No results for input(s): LIPASE, AMYLASE in the last 168 hours. No results for input(s): AMMONIA in the last 168 hours. Coagulation Profile: No results for input(s): INR, PROTIME in the last 168 hours. Cardiac Enzymes: No results for input(s): CKTOTAL, CKMB, CKMBINDEX, TROPONINI in the last 168 hours. BNP (last 3 results) No results for input(s): PROBNP in the last 8760 hours. HbA1C: No results for input(s): HGBA1C in the last 72 hours. CBG: Recent Labs  Lab 07/25/18 1653 07/25/18 1956 07/26/18 0038 07/26/18 0623 07/26/18 1157  GLUCAP 531* 404* 190* 301* 158*   Lipid Profile: No results for input(s): CHOL, HDL, LDLCALC, TRIG, CHOLHDL, LDLDIRECT in the last 72 hours. Thyroid Function Tests: No results for input(s): TSH, T4TOTAL, FREET4, T3FREE, THYROIDAB in the last 72 hours. Anemia Panel: No results for input(s): VITAMINB12, FOLATE, FERRITIN, TIBC, IRON, RETICCTPCT in the last 72 hours. Sepsis Labs: No results for input(s): PROCALCITON, LATICACIDVEN in the last 168 hours.  No results found for this or any previous visit (from the past 240 hour(s)).       Radiology Studies: No results found.      Scheduled Meds: . benztropine  0.5 mg Oral BID  . divalproex  1,500 mg Oral QHS  . gabapentin  300 mg Oral TID  . glimepiride  4 mg Oral Q breakfast  . hydrOXYzine  50 mg Oral BID  . insulin aspart  0-15 Units Subcutaneous TID WC  . insulin aspart  0-5 Units Subcutaneous QHS  . insulin aspart  5 Units Subcutaneous Once  . insulin glargine  18 Units Subcutaneous Daily  . lisinopril  20 mg Oral Daily  . metFORMIN  500 mg Oral BID WC  . mometasone-formoterol  2 puff Inhalation BID  . nicotine  21 mg Transdermal Daily  . prazosin  2 mg Oral QHS  . propranolol  20 mg  Oral BID  . risperiDONE  3 mg Oral BID   Continuous Infusions:   LOS: 2 days    Time spent: 35 minutes    Irine Seal, MD [facility] Pager [phone]  If 7PM-7AM, please contact night-coverage [email or web address] 07/26/2018, 3:30 PM

## 2018-07-26 NOTE — Plan of Care (Signed)
Patient self inventory- Patient slept poor last night, sleep medication was requested and appetite is good, energy level low, concentration good. Hopelessness and anxiety rated 0 and 10. Patient wrote he is "very depressed because I'm not home with my family." Patient endorses pain 9/10 on right and left shins last night. Patient's goal is "to go home and be with my loved ones." Patient is compliant with medications except for metformin. Denies SI HI AVH. Denies questions for the doctor. Safety is maintained with 15 minute checks as well as environmental checks. Will continue to monitor and provide support.  Problem: Education: Goal: Emotional status will improve Outcome: Progressing Goal: Mental status will improve Outcome: Progressing Goal: Verbalization of understanding the information provided will improve Outcome: Progressing   Problem: Activity: Goal: Interest or engagement in activities will improve Outcome: Progressing Goal: Sleeping patterns will improve Outcome: Progressing

## 2018-07-27 LAB — GLUCOSE, CAPILLARY: Glucose-Capillary: 317 mg/dL — ABNORMAL HIGH (ref 70–99)

## 2018-07-27 MED ORDER — INSULIN GLARGINE 100 UNIT/ML ~~LOC~~ SOLN
22.0000 [IU] | Freq: Every day | SUBCUTANEOUS | Status: DC
  Administered 2018-07-27: 08:00:00 22 [IU] via SUBCUTANEOUS

## 2018-07-27 MED ORDER — GLIMEPIRIDE 4 MG PO TABS: 4.0000 mg | ORAL_TABLET | Freq: Every day | ORAL | 0 refills | Status: DC

## 2018-07-27 MED ORDER — METFORMIN HCL 500 MG PO TABS: 500.0000 mg | ORAL_TABLET | Freq: Two times a day (BID) | ORAL | 0 refills | Status: DC

## 2018-07-27 MED ORDER — BENZTROPINE MESYLATE 0.5 MG PO TABS: 0.5000 mg | ORAL_TABLET | Freq: Two times a day (BID) | ORAL | 2 refills | Status: DC

## 2018-07-27 MED ORDER — RISPERIDONE 3 MG PO TABS: 3.0000 mg | ORAL_TABLET | Freq: Every day | ORAL | 0 refills | Status: DC

## 2018-07-27 MED ORDER — NICOTINE 21 MG/24HR TD PT24: 21.0000 mg | MEDICATED_PATCH | Freq: Every day | TRANSDERMAL | 0 refills | Status: DC

## 2018-07-27 MED ORDER — PROPRANOLOL HCL 20 MG PO TABS: 20.0000 mg | ORAL_TABLET | Freq: Two times a day (BID) | ORAL | 0 refills | Status: DC

## 2018-07-27 MED ORDER — TRUEPLUS LANCETS 28G MISC: 1.0000 | Freq: Two times a day (BID) | 0 refills | Status: DC

## 2018-07-27 MED ORDER — PRAZOSIN HCL 2 MG PO CAPS: 2.0000 mg | ORAL_CAPSULE | Freq: Every day | ORAL | 2 refills | Status: DC

## 2018-07-27 MED ORDER — INSULIN GLARGINE 100 UNIT/ML SOLOSTAR PEN: 22.0000 [IU] | PEN_INJECTOR | Freq: Every day | SUBCUTANEOUS | 0 refills | Status: DC

## 2018-07-27 MED ORDER — PALIPERIDONE PALMITATE ER 156 MG/ML IM SUSY: 156.0000 mg | PREFILLED_SYRINGE | Freq: Once | INTRAMUSCULAR | 11 refills | Status: DC

## 2018-07-27 MED ORDER — TRUE METRIX METER W/DEVICE KIT: PACK | 0 refills | Status: DC

## 2018-07-27 MED ORDER — LISINOPRIL 20 MG PO TABS: 20.0000 mg | ORAL_TABLET | Freq: Every day | ORAL | 0 refills | Status: DC

## 2018-07-27 MED ORDER — DIVALPROEX SODIUM ER 500 MG PO TB24: 1500.0000 mg | ORAL_TABLET | Freq: Every day | ORAL | 11 refills | Status: DC

## 2018-07-27 MED ORDER — INSULIN PEN NEEDLE 31G X 5 MM MISC: 22.0000 [IU] | Freq: Every day | 0 refills | Status: DC

## 2018-07-27 MED ORDER — GABAPENTIN 300 MG PO CAPS: 300.0000 mg | ORAL_CAPSULE | Freq: Three times a day (TID) | ORAL | 2 refills | Status: DC

## 2018-07-27 MED ORDER — GLUCOSE BLOOD VI STRP: ORAL_STRIP | 0 refills | Status: DC

## 2018-07-27 NOTE — Progress Notes (Signed)
  [facility] Adult Case Management Discharge Plan :  Will you be returning to the same living situation after discharge:  Yes,  with wife At discharge, do you have transportation home?: Yes,  wife or family member Do you have the ability to pay for your medications: Yes,  medicare  Release of information consent forms completed and in the chart;  Patient's signature needed at discharge.  Patient to Follow up at: Follow-up Information    [facility] Follow up on 08/01/2018.   Why:  Telephonic hospital follow up appointment is Tuesday, 4/7  at 9:00a . The provider will contact the patient.  Contact information: [place] [address] [phone]        PCP Follow up in 1 week(s).   Why:  F/U IN 1-2 WEEKS.          Next level of care provider has access to [place] and Suicide Prevention discussed: Yes,  with wife  Have you used any form of tobacco in the last 30 days? (Cigarettes, Smokeless Tobacco, Cigars, and/or Pipes): Yes  Has patient been referred to the Quitline?: Patient refused referral  Patient has been referred for addiction treatment: Pt. refused referral  Joanne Chars, [place] 07/27/2018, 9:31 AM

## 2018-07-27 NOTE — Progress Notes (Signed)
Inpatient Diabetes Program Recommendations  AACE/ADA: New Consensus Statement on Inpatient Glycemic Control (2015)  Target Ranges:  Prepandial:   less than 140 mg/dL      Peak postprandial:   less than 180 mg/dL (1-2 hours)      Critically ill patients:  140 - 180 mg/dL   Lab Results  Component Value Date   GLUCAP 317 (H) 07/27/2018   HGBA1C 11.6 (H) 07/12/2018    Review of Glycemic Control  Called RN to ask when this Coordinator could come and teach insulin pen administration to patient. Awaiting RN to return call. FBS this am > 300 mg/dL. Thank you. Lorenda Peck, RD, LDN, CDE Inpatient Diabetes Coordinator [phone]

## 2018-07-27 NOTE — BHH Suicide Risk Assessment (Signed)
[facility] Discharge Suicide Risk Assessment   Principal Problem: Schizoaffective disorder [facility]) Discharge Diagnoses: Principal Problem:   Schizoaffective disorder ([place]) Active Problems:   T2DM (type 2 diabetes mellitus) ([place])   HTN (hypertension)  alert-0x3 intrusive- stable mood no si no hi no psychosis  Total Time spent with patient: 45 minutes   Mental Status Per Nursing Assessment::   On Admission:  NA  Demographic Factors:  Male  Loss Factors: Decrease in vocational status  Historical Factors: NA  Risk Reduction Factors:   Religious beliefs about death  Continued Clinical Symptoms:  Alcohol/Substance Abuse/Dependencies  Cognitive Features That Contribute To Risk:  Closed-mindedness    Suicide Risk:  Minimal: No identifiable suicidal ideation.  Patients presenting with no risk factors but with morbid ruminations; may be classified as minimal risk based on the severity of the depressive symptoms  Follow-up Information    [facility] Follow up on 08/01/2018.   Why:  Telephonic hospital follow up appointment is Tuesday, 4/7  at 9:00a . The provider will contact the patient.  Contact information: [place] [address] [phone]        PCP Follow up in 1 week(s).   Why:  F/U IN 1-2 WEEKS.          Plan Of Care/Follow-up recommendations:  Activity:  full  Joshua Gudino, MD 07/27/2018, 8:43 AM

## 2018-07-27 NOTE — Plan of Care (Signed)
Discharge note  Patient verbalizes readiness for discharge. Follow up plan explained, AVS, Transition record and SRA given. Prescriptions and teaching provided. Belongings returned and signed for. Suicide safety plan completed and signed. Patient verbalizes understanding. Patient denies SI/HI and assures this Probation officer he will seek assistance should that change. Patient discharged to lobby where son was waiting.  Problem: Education: Goal: Knowledge of [facility] Education information/materials will improve Outcome: Adequate for Discharge Goal: Emotional status will improve Outcome: Adequate for Discharge Goal: Mental status will improve Outcome: Adequate for Discharge Goal: Verbalization of understanding the information provided will improve Outcome: Adequate for Discharge   Problem: Activity: Goal: Interest or engagement in activities will improve Outcome: Adequate for Discharge Goal: Sleeping patterns will improve Outcome: Adequate for Discharge   Problem: Coping: Goal: Ability to verbalize frustrations and anger appropriately will improve Outcome: Adequate for Discharge Goal: Ability to demonstrate self-control will improve Outcome: Adequate for Discharge   Problem: Health Behavior/Discharge Planning: Goal: Identification of resources available to assist in meeting health care needs will improve Outcome: Adequate for Discharge Goal: Compliance with treatment plan for underlying cause of condition will improve Outcome: Adequate for Discharge   Problem: Physical Regulation: Goal: Ability to maintain clinical measurements within normal limits will improve Outcome: Adequate for Discharge   Problem: Safety: Goal: Periods of time without injury will increase Outcome: Adequate for Discharge   Problem: Activity: Goal: Will verbalize the importance of balancing activity with adequate rest periods Outcome: Adequate for Discharge   Problem: Education: Goal: Will be free of  psychotic symptoms Outcome: Adequate for Discharge Goal: Knowledge of the prescribed therapeutic regimen will improve Outcome: Adequate for Discharge   Problem: Coping: Goal: Coping ability will improve Outcome: Adequate for Discharge Goal: Will verbalize feelings Outcome: Adequate for Discharge   Problem: Health Behavior/Discharge Planning: Goal: Compliance with prescribed medication regimen will improve Outcome: Adequate for Discharge   Problem: Nutritional: Goal: Ability to achieve adequate nutritional intake will improve Outcome: Adequate for Discharge   Problem: Role Relationship: Goal: Ability to communicate needs accurately will improve Outcome: Adequate for Discharge Goal: Ability to interact with others will improve Outcome: Adequate for Discharge   Problem: Safety: Goal: Ability to redirect hostility and anger into socially appropriate behaviors will improve Outcome: Adequate for Discharge Goal: Ability to remain free from injury will improve Outcome: Adequate for Discharge   Problem: Self-Care: Goal: Ability to participate in self-care as condition permits will improve Outcome: Adequate for Discharge   Problem: Self-Concept: Goal: Will verbalize positive feelings about self Outcome: Adequate for Discharge

## 2018-07-27 NOTE — Plan of Care (Signed)
Pt was able to identify positive coping skills at completion of recreation therapy group sessions.   [name], LRT/CTRS

## 2018-07-27 NOTE — Progress Notes (Signed)
Recreation Therapy Notes  INPATIENT RECREATION TR PLAN  Patient Details Name: Joshua [name] MRN: [id number] DOB: December 15, 1955 Today's Date: 07/27/2018  Rec Therapy Plan Is patient appropriate for Therapeutic Recreation?: Yes Treatment times per week: about 3 days Estimated Length of Stay: 5-7 days TR Treatment/Interventions: Group participation (Comment)  Discharge Criteria Pt will be discharged from therapy if:: Discharged Treatment plan/goals/alternatives discussed and agreed upon by:: Patient/family  Discharge Summary Short term goals set: See patient care plan. Short term goals met: Complete Progress toward goals comments: Groups attended Which groups?: Coping skills, Wellness, Other (Comment)(Team building) Reason goals not met: None Therapeutic equipment acquired: N/A Reason patient discharged from therapy: Discharge from hospital Pt/family agrees with progress & goals achieved: Yes Date patient discharged from therapy: 07/27/18    [name], LRT/CTRS  Ria Comment, Magalie Almon A 07/27/2018, 11:57 AM

## 2018-07-27 NOTE — Progress Notes (Signed)
Recreation Therapy Notes  Date: 4.2.20 Time:  1000 Location: 500 Hall Dayroom  Group Topic: Communication, Team Building, Problem Solving  Goal Area(s) Addresses:  Patient will effectively work with peer towards shared goal.  Patient will identify skills used to make activity successful.  Patient will identify how skills used during activity can be used to reach post d/c goals.   Behavioral Response:  None  Intervention: STEM Activity  Activity: Landing Pad. In teams patients were given 12 plastic drinking straws and a length of masking tape. Using the materials provided patients were asked to build a landing pad to catch a golf ball dropped from approximately 6 feet in the air.   Education: Education officer, community, Discharge Planning   Education Outcome: Acknowledges education/In group clarification offered/Needs additional education.   Clinical Observations/Feedback:  Pt observed his peers.  Pt was social with peers.  Pt left early and did not return.    [name], LRT/CTRS    [name] A 07/27/2018 11:21 AM

## 2018-07-27 NOTE — Discharge Summary (Signed)
Physician Discharge Summary Note  Patient:  Joshua [name] is an 63 y.o., male MRN:  [id number] DOB:  1956-03-02 Patient phone:  [phone] (home)  Patient address:   [place] [address],  Total Time spent with patient: 45 minutes  Date of Admission:  07/24/2018 Date of Discharge: 07/27/18  Reason for Admission:   History of Present Illness:   This is related to multiple admissions and healthcare encounters for Mr. Joshua [name] a 63 year old patient who is been diagnosed with a schizoaffective condition, who presented to the emergency department on 3/8, 22 days ago stating that he had discontinued his psychiatric medications.  He also presented again on 3/23 with somatic symptoms, 3/27 with hyperglycemia And of course on 3/30 with a exacerbation and underlying psychosis, petition by family members. According to collateral history patient is been obsessed with a coronavirus become increasingly paranoid, making bizarre statements that "all bad people should be killed" and was noted to be making disjointed bizarre statements on his assessment note of 3/30 by Ms. Joshua [name] And this noted his elaborated that he was jumping from topic to topic, as he is with my exam, stated that when he died "all humanity" would die believe people were tracking him so forth.  Again he was quite paranoid  On my evaluation the patient simply asks for discharge states there is nothing wrong with him that he was moving things out of the house, was going to pain "an [place] stick" and put a wood needle on the porch for decoration so again he rambles gives me irrelevant material and tries to rationalize everything that he is been accused of in the petition papers.  Further he acknowledges long-term cannabis dependency "real heavy" for 5 years.  He fails to see the cannabis may be contributing to his psychotic disorder and he further refuses to give this up. His current dose of Risperdal is 3 mg at bedtime and  Depakote 1500 mg at bedtime  Alert oriented to person place situation time but not date rambling but not pressured in speech, jumps from topic to topic that is generally relevant denies wanting to harm self denies wanting to harm others and denies making the unusual statements listed above.  Denies hallucinations keeps insisting he just mind his own business sits on his porch and smokes marijuana.   Principal Problem: Schizoaffective disorder [facility]) Discharge Diagnoses: Principal Problem:   Schizoaffective disorder ([place]) Active Problems:   T2DM (type 2 diabetes mellitus) ([place])   HTN (hypertension)   Past Medical History:  Past Medical History:  Diagnosis Date  . Back pain   . Bipolar disorder ([place])   . Cannabis use disorder, moderate, dependence ([place])   . Chronic pain   . Diabetes mellitus without complication ([place])   . Generalized anxiety disorder   . Headache   . Hypertension   . Schizoaffective disorder [facility])     Past Surgical History:  Procedure Laterality Date  . APPENDECTOMY  1970   Family History:  Family History  Problem Relation Age of Onset  . Diabetes Mother   . Cancer Mother        unsure what type  . Heart disease Mother   . Hypertension Mother   . Dementia Father   . Diabetes Brother   . Dementia Maternal Grandfather   . Cancer Paternal Grandmother 6       breast  . Mental illness Neg Hx    Social History:  Social History   Substance and Sexual  Activity  Alcohol Use Not Currently     Social History   Substance and Sexual Activity  Drug Use Yes  . Types: Marijuana   Comment: "have changed to hemp now" 09/17/17    Social History   Socioeconomic History  . Marital status: Married    Spouse name: Not on file  . Number of children: Not on file  . Years of education: Not on file  . Highest education level: Not on file  Occupational History  . Not on file  Social Needs  . Financial resource strain: Not on file  . Food insecurity:    Worry:  Not on file    Inability: Not on file  . Transportation needs:    Medical: Not on file    Non-medical: Not on file  Tobacco Use  . Smoking status: Current Every Day Smoker    Packs/day: 2.00    Types: Cigarettes  . Smokeless tobacco: Never Used  Substance and Sexual Activity  . Alcohol use: Not Currently  . Drug use: Yes    Types: Marijuana    Comment: "have changed to hemp now" 09/17/17  . Sexual activity: Not on file  Lifestyle  . Physical activity:    Days per week: Not on file    Minutes per session: Not on file  . Stress: Not on file  Relationships  . Social connections:    Talks on phone: Not on file    Gets together: Not on file    Attends religious service: Not on file    Active member of club or organization: Not on file    Attends meetings of clubs or organizations: Not on file    Relationship status: Not on file  Other Topics Concern  . Not on file  Social History Narrative  . Not on file    Hospital Course:    Once here the patient always denied the need for hospitalization insisted he had been wrongfully committed but was manic to hypomanic and was certainly intrusive and re-direction was very frequent in fact every time he saw me on the ward even until the last hospital day he had to ask me some sort of question however irrelevant he simply could not help himself as far as his intrusiveness.  At any rate he did agree to long-acting injectable because he had responded well to the Risperdal he wanted the version of it that would last a month of course that is the invega version but by the date of the second he had no acute psychosis no thoughts of harming self or others still intrusive but not manic or hypomanic and does seem to be stable of course he is told abstain from cannabis but he probably will not. Schizo affective bipolar type/cannabis dependence The other issue was he was somewhat passive-aggressive resisting insulin and Glucophage and so forth during some  of his stay   Physical Findings: AIMS: Facial and Oral Movements Muscles of Facial Expression: None, normal Lips and Perioral Area: None, normal Jaw: None, normal Tongue: None, normal,Extremity Movements Upper (arms, wrists, hands, fingers): None, normal Lower (legs, knees, ankles, toes): None, normal, Trunk Movements Neck, shoulders, hips: None, normal, Overall Severity Severity of abnormal movements (highest score from questions above): None, normal Incapacitation due to abnormal movements: None, normal Patient's awareness of abnormal movements (rate only patient's report): No Awareness, Dental Status Current problems with teeth and/or dentures?: Yes Does patient usually wear dentures?: No  CIWA:    COWS:  Musculoskeletal: Strength & Muscle Tone: within normal limits Gait & Station: normal Patient leans: N/A  Psychiatric Specialty Exam: Physical Exam  ROS  Blood pressure (!) 147/89, pulse 79, temperature 97.8 F (36.6 C), temperature source Oral, resp. rate 18, height '5\' 9"'$  (1.753 m), weight 100.2 kg, SpO2 98 %.Body mass index is 32.64 kg/m.  General Appearance: Disheveled  Eye Contact:  Good  Speech:  Pressured  Volume:  Normal  Mood:  Euthymic  Affect:  Constricted  Thought Process:  Goal Directed  Orientation:  Full (Time, Place, and Person)  Thought Content:  Logical  Suicidal Thoughts:  No  Homicidal Thoughts:  No  Memory:  Immediate;   Fair  Judgement:  Fair  Insight:  Fair  Psychomotor Activity:  Normal  Concentration:  Concentration: Fair  Recall:  [place] of Knowledge:  Fair  Language:  Fair  Akathisia:  Negative  Handed:  Right  AIMS (if indicated):     Assets:  Communication Skills  ADL's:  Intact  Cognition:  WNL  Sleep:  Number of Hours: 5     Have you used any form of tobacco in the last 30 days? (Cigarettes, Smokeless Tobacco, Cigars, and/or Pipes): Yes  Has this patient used any form of tobacco in the last 30 days? (Cigarettes,  Smokeless Tobacco, Cigars, and/or Pipes) Yes, No  Blood Alcohol level:  Lab Results  Component Value Date   ETH <10 07/24/2018   ETH <10 50/53/9767    Metabolic Disorder Labs:  Lab Results  Component Value Date   HGBA1C 11.6 (H) 07/12/2018   MPG 134 09/30/2016   Lab Results  Component Value Date   PROLACTIN 39.4 (H) 09/30/2016   Lab Results  Component Value Date   CHOL 111 09/30/2016   TRIG 176 (H) 09/30/2016   HDL 34 (L) 09/30/2016   CHOLHDL 3.3 09/30/2016   VLDL 35 09/30/2016   LDLCALC 42 09/30/2016    See Psychiatric Specialty Exam and Suicide Risk Assessment completed by Attending Physician prior to discharge.  Discharge destination:  Home  Is patient on multiple antipsychotic therapies at discharge:  No   Has Patient had three or more failed trials of antipsychotic monotherapy by history:  No  Recommended Plan for Multiple Antipsychotic Therapies: NA   Allergies as of 07/27/2018      Reactions   Imitrex [sumatriptan] Other (See Comments)   mania      Medication List    STOP taking these medications   azithromycin 250 MG tablet Commonly known as:  Zithromax Z-Pak   hydrOXYzine 50 MG tablet Commonly known as:  ATARAX/VISTARIL   traZODone 100 MG tablet Commonly known as:  DESYREL     TAKE these medications     Indication  benztropine 0.5 MG tablet Commonly known as:  COGENTIN Take 1 tablet (0.5 mg total) by mouth 2 (two) times daily.  Indication:  Extrapyramidal Reaction caused by Medications   budesonide-formoterol 160-4.5 MCG/ACT inhaler Commonly known as:  SYMBICORT Inhale 2 puffs into the lungs 2 (two) times daily.  Indication:  Chronic Obstructive Lung Disease   divalproex 500 MG 24 hr tablet Commonly known as:  DEPAKOTE ER Take 3 tablets (1,500 mg total) by mouth at bedtime.  Indication:  Manic Phase of Manic-Depression   gabapentin 300 MG capsule Commonly known as:  NEURONTIN Take 1 capsule (300 mg total) by mouth 3 (three) times  daily.  Indication:  Neuropathic Pain   glimepiride 4 MG tablet Commonly known as:  AMARYL  Take 1 tablet (4 mg total) by mouth daily with breakfast.  Indication:  Type 2 Diabetes   glucose blood test strip Use as instructed  Indication:  Diabetes   Insulin Glargine 100 UNIT/ML Solostar Pen Commonly known as:  Lantus SoloStar Inject 22 Units into the skin daily.  Indication:  Type 2 Diabetes   Insulin Pen Needle 31G X 5 MM Misc 22 Units by Does not apply route daily.  Indication:  Diabetes   ipratropium 0.02 % nebulizer solution Commonly known as:  ATROVENT Take 2.5 mLs (0.5 mg total) by nebulization 4 (four) times daily. With or instead of the Albuterol  Indication:  Chronic Obstructive Lung Disease   lisinopril 20 MG tablet Commonly known as:  PRINIVIL,ZESTRIL Take 1 tablet (20 mg total) by mouth daily.  Indication:  High Blood Pressure Disorder   metFORMIN 500 MG tablet Commonly known as:  GLUCOPHAGE Take 1 tablet (500 mg total) by mouth 2 (two) times daily with a meal.  Indication:  Type 2 Diabetes   nicotine 21 mg/24hr patch Commonly known as:  NICODERM CQ - dosed in mg/24 hours Place 1 patch (21 mg total) onto the skin daily. Start taking on:  July 28, 2018  Indication:  Nicotine Addiction   paliperidone 156 MG/ML Susy injection Commonly known as:  Lorayne Bender Sustenna Inject 1 mL (156 mg total) into the muscle once for 1 dose. Due 5/1  Indication:  Schizoaffective Disorder   prazosin 2 MG capsule Commonly known as:  MINIPRESS Take 1 capsule (2 mg total) by mouth at bedtime. What changed:    medication strength  how much to take  when to take this  reasons to take this  Indication:  Disturbed Sleep   propranolol 20 MG tablet Commonly known as:  INDERAL Take 1 tablet (20 mg total) by mouth 2 (two) times daily.  Indication:  High Blood Pressure Disorder   risperiDONE 3 MG tablet Commonly known as:  RISPERDAL Take 1 tablet (3 mg total) by mouth at  bedtime for 7 days. What changed:  when to take this  Indication:  Hypomanic Episode of Bipolar Disorder   True Metrix Meter w/Device Kit Check blood sugars fasting and at bedtime  Indication:  Diabetes   TRUEplus Lancets 28G Misc 1 each by Does not apply route 2 (two) times daily. What changed:  Another medication with the same name was removed. Continue taking this medication, and follow the directions you see here.  Indication:  Diabetes   Ventolin HFA 108 (90 Base) MCG/ACT inhaler Generic drug:  albuterol Inhale 2 puffs into the lungs every 4 (four) hours as needed. What changed:  reasons to take this  Indication:  Chronic Obstructive Lung Disease   albuterol (2.5 MG/3ML) 0.083% nebulizer solution Commonly known as:  PROVENTIL Take 3 mLs (2.5 mg total) by nebulization every 6 (six) hours as needed for wheezing or shortness of breath. What changed:  Another medication with the same name was changed. Make sure you understand how and when to take each.  Indication:  Spasm of Lung Air Passages      Follow-up Information    [facility] Follow up on 08/01/2018.   Why:  Telephonic hospital follow up appointment is Tuesday, 4/7  at 9:00a . The provider will contact the patient.  Contact information: [place] [id number] [phone]        PCP Follow up in 1 week(s).   Why:  F/U IN 1-2 WEEKS.  SignedJohnn Hai, MD 07/27/2018, 9:55 AM

## 2018-08-01 ENCOUNTER — Ambulatory Visit: Payer: Self-pay | Admitting: Dietician

## 2018-08-02 ENCOUNTER — Other Ambulatory Visit: Payer: Self-pay

## 2018-08-02 ENCOUNTER — Ambulatory Visit (HOSPITAL_COMMUNITY)
Admission: EM | Admit: 2018-08-02 | Discharge: 2018-08-02 | Disposition: A | Discharge status: Home / Self Care | Payer: Medicare Other | Attending: Family Medicine | Admitting: Family Medicine

## 2018-08-02 ENCOUNTER — Encounter (HOSPITAL_COMMUNITY): Payer: Self-pay | Admitting: Emergency Medicine

## 2018-08-02 DIAGNOSIS — K0889 Other specified disorders of teeth and supporting structures: Secondary | ICD-10-CM

## 2018-08-02 DIAGNOSIS — E1165 Type 2 diabetes mellitus with hyperglycemia: Secondary | ICD-10-CM

## 2018-08-02 DIAGNOSIS — R062 Wheezing: Secondary | ICD-10-CM

## 2018-08-02 DIAGNOSIS — K047 Periapical abscess without sinus: Secondary | ICD-10-CM

## 2018-08-02 LAB — GLUCOSE, CAPILLARY: Glucose-Capillary: 349 mg/dL — ABNORMAL HIGH (ref 70–99)

## 2018-08-02 MED ORDER — AMOXICILLIN-POT CLAVULANATE 875-125 MG PO TABS: 1.0000 | ORAL_TABLET | Freq: Two times a day (BID) | ORAL | 0 refills | Status: AC

## 2018-08-02 MED ORDER — IBUPROFEN 800 MG PO TABS: 800.0000 mg | ORAL_TABLET | Freq: Three times a day (TID) | ORAL | 0 refills | Status: DC | PRN

## 2018-08-02 MED ORDER — ALBUTEROL SULFATE HFA 108 (90 BASE) MCG/ACT IN AERS
INHALATION_SPRAY | RESPIRATORY_TRACT | Status: AC
  Filled 2018-08-02: qty 6.7

## 2018-08-02 MED ORDER — GLUCOSE BLOOD VI STRP: ORAL_STRIP | 1 refills | Status: DC

## 2018-08-02 MED ORDER — ALBUTEROL SULFATE HFA 108 (90 BASE) MCG/ACT IN AERS
2.0000 | INHALATION_SPRAY | Freq: Once | RESPIRATORY_TRACT | Status: AC
  Administered 2018-08-02: 2 via RESPIRATORY_TRACT

## 2018-08-02 NOTE — ED Provider Notes (Signed)
[place]   [id number] 08/02/18 Arrival Time: 1103  CC: DENTAL PAIN  SUBJECTIVE:  Joshua [name] is a 62 y.o. male hx significant for bipoloar disorder, cannabis use disorder, chronic pain, DM, GAD, HA, HTN, and schizoaffective disorder, who presents with dental pain x 2 weeks.  Denies a precipitating event or trauma.  Localizes pain to left side.  Has tried OTC oragel with temporary relief.  Worse with chewing.  Does not see a dentist regularly.  Denies fever, chills, dysphagia, odynophagia, oral or neck swelling, nausea, vomiting, chest pain, SOB.    Also mentions ongoing cough and congestion for past couple of months.  Had negative chest x-ray in the hospital on 07/02/2018.    Request CBG.  Has medications for his DM at pharmacy.  Has not picked up.    ROS: As per HPI.  Past Medical History:  Diagnosis Date  . Back pain   . Bipolar disorder ([place])   . Cannabis use disorder, moderate, dependence ([place])   . Chronic pain   . Diabetes mellitus without complication ([place])   . Generalized anxiety disorder   . Headache   . Hypertension   . Schizoaffective disorder [facility])    Past Surgical History:  Procedure Laterality Date  . APPENDECTOMY  1970   Allergies  Allergen Reactions  . Imitrex [Sumatriptan] Other (See Comments)    mania   No current facility-administered medications on file prior to encounter.    Current Outpatient Medications on File Prior to Encounter  Medication Sig Dispense Refill  . gabapentin (NEURONTIN) 300 MG capsule Take 1 capsule (300 mg total) by mouth 3 (three) times daily. 90 capsule 2  . lisinopril (PRINIVIL,ZESTRIL) 20 MG tablet Take 1 tablet (20 mg total) by mouth daily. 30 tablet 0  . metFORMIN (GLUCOPHAGE) 500 MG tablet Take 1 tablet (500 mg total) by mouth 2 (two) times daily with a meal. 60 tablet 0  . albuterol (PROVENTIL) (2.5 MG/3ML) 0.083% nebulizer solution Take 3 mLs (2.5 mg total) by nebulization every 6 (six) hours as needed for  wheezing or shortness of breath. 75 mL 12  . benztropine (COGENTIN) 0.5 MG tablet Take 1 tablet (0.5 mg total) by mouth 2 (two) times daily. 60 tablet 2  . Blood Glucose Monitoring Suppl (TRUE METRIX METER) w/Device KIT Check blood sugars fasting and at bedtime 1 kit 0  . budesonide-formoterol (SYMBICORT) 160-4.5 MCG/ACT inhaler Inhale 2 puffs into the lungs 2 (two) times daily. 1 Inhaler 3  . divalproex (DEPAKOTE ER) 500 MG 24 hr tablet Take 3 tablets (1,500 mg total) by mouth at bedtime. 90 tablet 11  . glimepiride (AMARYL) 4 MG tablet Take 1 tablet (4 mg total) by mouth daily with breakfast. 30 tablet 0  . Insulin Glargine (LANTUS SOLOSTAR) 100 UNIT/ML Solostar Pen Inject 22 Units into the skin daily. 15 mL 0  . Insulin Pen Needle 31G X 5 MM MISC 22 Units by Does not apply route daily. 100 each 0  . ipratropium (ATROVENT) 0.02 % nebulizer solution Take 2.5 mLs (0.5 mg total) by nebulization 4 (four) times daily. With or instead of the Albuterol 75 mL 0  . nicotine (NICODERM CQ - DOSED IN MG/24 HOURS) 21 mg/24hr patch Place 1 patch (21 mg total) onto the skin daily. 28 patch 0  . paliperidone (INVEGA SUSTENNA) 156 MG/ML SUSY injection Inject 1 mL (156 mg total) into the muscle once for 1 dose. Due 5/1 1 mL 11  . prazosin (MINIPRESS) 2 MG capsule  Take 1 capsule (2 mg total) by mouth at bedtime. 30 capsule 2  . propranolol (INDERAL) 20 MG tablet Take 1 tablet (20 mg total) by mouth 2 (two) times daily. 60 tablet 0  . risperiDONE (RISPERDAL) 3 MG tablet Take 1 tablet (3 mg total) by mouth at bedtime for 7 days. 7 tablet 0  . TRUEplus Lancets 28G MISC 1 each by Does not apply route 2 (two) times daily. 100 each 0  . VENTOLIN HFA 108 (90 Base) MCG/ACT inhaler Inhale 2 puffs into the lungs every 4 (four) hours as needed. (Patient taking differently: Inhale 2 puffs into the lungs every 4 (four) hours as needed for wheezing or shortness of breath. ) 1 Inhaler 0   Social History   Socioeconomic History   . Marital status: Married    Spouse name: Not on file  . Number of children: Not on file  . Years of education: Not on file  . Highest education level: Not on file  Occupational History  . Not on file  Social Needs  . Financial resource strain: Not on file  . Food insecurity:    Worry: Not on file    Inability: Not on file  . Transportation needs:    Medical: Not on file    Non-medical: Not on file  Tobacco Use  . Smoking status: Current Every Day Smoker    Packs/day: 2.00    Types: Cigarettes  . Smokeless tobacco: Never Used  Substance and Sexual Activity  . Alcohol use: Not Currently  . Drug use: Yes    Types: Marijuana    Comment: "have changed to hemp now" 09/17/17  . Sexual activity: Not on file  Lifestyle  . Physical activity:    Days per week: Not on file    Minutes per session: Not on file  . Stress: Not on file  Relationships  . Social connections:    Talks on phone: Not on file    Gets together: Not on file    Attends religious service: Not on file    Active member of club or organization: Not on file    Attends meetings of clubs or organizations: Not on file    Relationship status: Not on file  . Intimate partner violence:    Fear of current or ex partner: Not on file    Emotionally abused: Not on file    Physically abused: Not on file    Forced sexual activity: Not on file  Other Topics Concern  . Not on file  Social History Narrative  . Not on file   Family History  Problem Relation Age of Onset  . Diabetes Mother   . Cancer Mother        unsure what type  . Heart disease Mother   . Hypertension Mother   . Dementia Father   . Diabetes Brother   . Dementia Maternal Grandfather   . Cancer Paternal Grandmother 71       breast  . Mental illness Neg Hx     OBJECTIVE:  Vitals:   08/02/18 1132  BP: (!) 150/96  Pulse: 78  Resp: 18  Temp: 98.5 F (36.9 C)  TempSrc: Oral  SpO2: 98%    General appearance: alert; no distress HENT:  normocephalic; atraumatic; dentition: poor; poor over left upper and lower, left lower gums without areas of fluctuance; nares patent; oropharynx clear Neck: supple without LAD Lungs: rhonchi and wheezes throughout bilateral lung fields Skin: warm and dry Psychological:   alert and cooperative; normal mood and affect  ASSESSMENT & PLAN:  1. Pain, dental   2. Dental infection   3. Wheezing   4. Type 2 diabetes mellitus with hyperglycemia, unspecified whether long term insulin use (HCC)     Meds ordered this encounter  Medications  . albuterol (PROVENTIL HFA;VENTOLIN HFA) 108 (90 Base) MCG/ACT inhaler 2 puff  . ibuprofen (ADVIL,MOTRIN) 800 MG tablet    Sig: Take 1 tablet (800 mg total) by mouth every 8 (eight) hours as needed for moderate pain.    Dispense:  30 tablet    Refill:  0    Order Specific Question:   Supervising Provider    Answer:   NELSON, YVONNE SUE [[id number]]  . amoxicillin-clavulanate (AUGMENTIN) 875-125 MG tablet    Sig: Take 1 tablet by mouth every 12 (twelve) hours for 10 days.    Dispense:  20 tablet    Refill:  0    Order Specific Question:   Supervising Provider    Answer:   NELSON, YVONNE SUE [[id number]]  . glucose blood test strip    Sig: Use as instructed    Dispense:  100 each    Refill:  1    Order Specific Question:   Supervising Provider    Answer:   NELSON, YVONNE SUE [[id number]]    Ibuprofen prescribed.  Take as needed for pain.  DO NOT TAKE WITH OTHER antiinflammatories as this may cause GI upset, or bleed Augmentin prescribed.  Take as directed for potential dental infection Recommend soft diet until evaluated by dentist Maintain oral hygiene care Follow up with dentist as soon as possible for further evaluation and treatment  Call or go to the ED if you have any new or worsening symptoms such as fever, chills, difficulty swallowing, painful swallowing, oral or neck swelling, nausea, vomiting, chest pain, SOB, etc...  Inhaler given in office for  wheezing heard on exam.  Use as needed for shortness of breath and/or wheezing  Blood sugar elevated in office Please pick up medications and take as directed Follow up with PCP or with [facility] this week or next for reevaluation and management of chronic medication conditions including diabetes  Call or go to the ED if you have any new or worsening symptoms such as fever, chills, nausea, vomiting, cough, shortness of breath, chest pain, chest pressure, symptoms do not improve with medications, etc...  Reviewed expectations re: course of current medical issues. Questions answered. Outlined signs and symptoms indicating need for more acute intervention. Patient verbalized understanding. After Visit Summary given.   , , PA-C 08/02/18 1257  

## 2018-08-02 NOTE — Discharge Instructions (Signed)
Ibuprofen prescribed.  Take as needed for pain.  DO NOT TAKE WITH OTHER antiinflammatories as this may cause GI upset, or bleed Augmentin prescribed.  Take as directed for potential dental infection Recommend soft diet until evaluated by dentist Maintain oral hygiene care Follow up with dentist as soon as possible for further evaluation and treatment  Call or go to the ED if you have any new or worsening symptoms such as fever, chills, difficulty swallowing, painful swallowing, oral or neck swelling, nausea, vomiting, chest pain, SOB, etc...  Inhaler given in office for wheezing heard on exam.  Use as needed for shortness of breath and/or wheezing  Blood sugar elevated in office Please pick up medications and take as directed Follow up with PCP or with [facility] this week or next for reevaluation and management of chronic medication conditions including diabetes  Call or go to the ED if you have any new or worsening symptoms such as fever, chills, nausea, vomiting, cough, shortness of breath, chest pain, chest pressure, symptoms do not improve with medications, etc..Marland Kitchen

## 2018-08-02 NOTE — ED Notes (Signed)
Bed: UC07 Expected date:  Expected time:  Means of arrival:  Comments: Hold until 1122 then Bleach

## 2018-08-02 NOTE — ED Triage Notes (Signed)
Cough and congestion.  Ongoing issues. Patient was seen in [facility] [place] [facility] (3/27 and 3/30)    Patient asking for cbg today and affordable strips for meter

## 2018-08-04 ENCOUNTER — Telehealth (HOSPITAL_COMMUNITY): Payer: Self-pay | Admitting: Emergency Medicine

## 2018-08-04 DIAGNOSIS — E1165 Type 2 diabetes mellitus with hyperglycemia: Secondary | ICD-10-CM

## 2018-08-04 MED ORDER — GLUCOSE BLOOD VI STRP: ORAL_STRIP | 1 refills | Status: DC

## 2018-08-04 NOTE — Telephone Encounter (Signed)
Pharmacy change for glucose test strips, needed frequency, okay to change to QID per brittany.

## 2018-08-28 ENCOUNTER — Other Ambulatory Visit: Payer: Self-pay

## 2018-08-28 ENCOUNTER — Other Ambulatory Visit: Payer: Self-pay | Admitting: Pharmacist

## 2018-08-28 ENCOUNTER — Encounter: Payer: Self-pay | Admitting: Family Medicine

## 2018-08-28 ENCOUNTER — Ambulatory Visit: Payer: Medicare Other | Attending: Family Medicine | Admitting: Family Medicine

## 2018-08-28 VITALS — BP 159/99 | HR 81 | Temp 98.1°F | Ht 69.0 in | Wt 230.6 lb

## 2018-08-28 DIAGNOSIS — D696 Thrombocytopenia, unspecified: Secondary | ICD-10-CM | POA: Diagnosis not present

## 2018-08-28 DIAGNOSIS — E1165 Type 2 diabetes mellitus with hyperglycemia: Secondary | ICD-10-CM

## 2018-08-28 DIAGNOSIS — G8929 Other chronic pain: Secondary | ICD-10-CM | POA: Diagnosis not present

## 2018-08-28 DIAGNOSIS — Z79899 Other long term (current) drug therapy: Secondary | ICD-10-CM | POA: Diagnosis not present

## 2018-08-28 DIAGNOSIS — M5442 Lumbago with sciatica, left side: Secondary | ICD-10-CM

## 2018-08-28 DIAGNOSIS — F250 Schizoaffective disorder, bipolar type: Secondary | ICD-10-CM

## 2018-08-28 DIAGNOSIS — R5383 Other fatigue: Secondary | ICD-10-CM

## 2018-08-28 DIAGNOSIS — I10 Essential (primary) hypertension: Secondary | ICD-10-CM

## 2018-08-28 LAB — GLUCOSE, POCT (MANUAL RESULT ENTRY): POC Glucose: 259 mg/dL — AB (ref 70–99)

## 2018-08-28 MED ORDER — BASAGLAR KWIKPEN 100 UNIT/ML ~~LOC~~ SOPN: 15.0000 [IU] | PEN_INJECTOR | Freq: Every day | SUBCUTANEOUS | 0 refills | Status: DC

## 2018-08-28 MED ORDER — DICLOFENAC POTASSIUM 50 MG PO TABS: 50.0000 mg | ORAL_TABLET | Freq: Two times a day (BID) | ORAL | 5 refills | Status: DC

## 2018-08-28 MED ORDER — LOSARTAN POTASSIUM 100 MG PO TABS: 100.0000 mg | ORAL_TABLET | Freq: Every day | ORAL | 3 refills | Status: DC

## 2018-08-28 MED ORDER — INSULIN GLARGINE 100 UNIT/ML SOLOSTAR PEN: 15.0000 [IU] | PEN_INJECTOR | Freq: Every day | SUBCUTANEOUS | 3 refills | Status: DC

## 2018-08-28 NOTE — Patient Instructions (Addendum)
A prescription has been sent to your pharmacy to restart the use of long-acting insulin, Lantus at 15 units once daily.  Please record your blood sugars and bring your glucometer and blood sugar diary to an appointment in 2 weeks with the clinical pharmacist to help adjust your medications to better control your blood sugars.  A new prescription has been sent to your pharmacy for a new blood pressure medication, losartan 100 mg once daily.  Please stop the use of lisinopril.  Also continue propranolol and any other medications which you currently take for control of your blood pressure.  Please call if you would like a referral to orthopedics or pain management in follow-up of your chronic back pain.  A prescription for diclofenac 50 mg twice daily will be sent to your pharmacy for back pain.  Please stop the use of ibuprofen.

## 2018-08-28 NOTE — Progress Notes (Signed)
Established Patient Office Visit  Subjective:  Patient ID: Joshua [name], male    DOB: 06/09/1955  Age: 63 y.o. MRN: [id number]  CC:  Chief Complaint  Patient presents with  . Hospitalization Follow-up    Dental Pain  . Medication Refill  ED follow-up from 08/02/2018 due to dental pain; wheezing and Type 2 DM; BP was also elevated in ED at 150/96-wheezing and dental pain resolved  HPI Joshua [name] presents for ED follow-up on 08/02/2018. Patient with last Hgb A1c of 11.6 at his last visit here in the office on 07/12/2018.  Patient states, " I have been sick all winter".  Patient however cannot clarify what he means by being sick all winter.  Patient reports that he went to the emergency department in March and his blood sugar was in the 500s and he was started on insulin.  Patient then states that he was admitted to the hospital for a mental health issue and was given insulin shots while he was in the hospital but time of discharge, he did not receive a prescription for insulin.  Patient reports and now he is currently only taking Amaryl and his fasting blood sugars are in the 200s and his blood sugars are up to the 400s to 500s at bedtime.  Patient reports that he is able to monitor his blood sugars at home but does not do this every day.      Patient also reports that he has noticed that his skin has been very dry all winter.  Patient wonders if this could be a side effect of some of his medications.  Patient also with complaint of chronic mid low back pain that sometimes radiates down his left leg.  Patient states that he is taking ibuprofen for the pain but would like to have a stronger pain medication but is aware that this office does not prescribe narcotic pain medicine.  Patient was asked what medications he was allergic to and patient stated " I have broken out in the past with some medications but I do not know what they were, I am not allergic to anything."        He denies any  wheezing, shortness of breath or cough since being treated in the emergency department on 08/02/2018.  He continues to smoke and states that he smokes about 2 packs/day.  Patient states that he went with his family to [employer] on Sunday and stayed for about an hour and when he got home he was so tired that he had to take a nap.  Patient would like to have blood work in follow-up of his fatigue.  Past Medical History:  Diagnosis Date  . Back pain   . Bipolar disorder ([place])   . Cannabis use disorder, moderate, dependence ([place])   . Chronic pain   . Diabetes mellitus without complication ([place])   . Generalized anxiety disorder   . Headache   . Hypertension   . Schizoaffective disorder [facility])     Past Surgical History:  Procedure Laterality Date  . APPENDECTOMY  1970    Family History  Problem Relation Age of Onset  . Diabetes Mother   . Cancer Mother        unsure what type  . Heart disease Mother   . Hypertension Mother   . Dementia Father   . Diabetes Brother   . Dementia Maternal Grandfather   . Cancer Paternal Grandmother 75       breast  .  Mental illness Neg Hx    Social History   Tobacco Use  . Smoking status: Current Every Day Smoker    Packs/day: 2.00    Types: Cigarettes  . Smokeless tobacco: Never Used  Substance Use Topics  . Alcohol use: Not Currently  . Drug use: Yes    Types: Marijuana    Comment: "have changed to hemp now" 09/17/17    Allergies  Allergen Reactions  . Imitrex [Sumatriptan] Other (See Comments)    mania    ROS Review of Systems  Constitutional: Positive for fatigue. Negative for chills and fever.  HENT: Negative for congestion, sore throat and trouble swallowing.   Eyes: Negative for photophobia and visual disturbance.  Respiratory: Negative for cough and shortness of breath.   Gastrointestinal: Negative for abdominal pain, constipation, diarrhea, nausea and vomiting.  Endocrine: Positive for polydipsia and polyuria. Negative for  cold intolerance, heat intolerance and polyphagia.  Genitourinary: Positive for frequency (when his blood sugar is high). Negative for dysuria.  Musculoskeletal: Positive for back pain and gait problem.  Skin: Positive for color change. Negative for rash and wound.       Patient reports that his skin is very dry and darker in color  Neurological: Negative for dizziness and headaches.  Hematological: Negative for adenopathy. Does not bruise/bleed easily.      Objective:    Physical Exam  Constitutional: He is oriented to person, place, and time. He appears well-developed and well-nourished. No distress.  WNWD older male with somewhat long hair and beard wearing mask and sitting on the exam table  Cardiovascular: Normal rate and regular rhythm.  Pulmonary/Chest: Effort normal and breath sounds normal. No respiratory distress. He has no wheezes. He has no rales.  Abdominal: Soft. There is no abdominal tenderness. There is no rebound and no guarding.  Musculoskeletal:        General: Tenderness present. No edema.     Comments: Patient with complaint of tenderness with palpation at approximately L4-5 midline back and mild left SI joint tenderness  Neurological: He is alert and oriented to person, place, and time.  Skin: Skin is dry.  Patient with dry, hyperpigmented skin on on the dorsum of both arms and hands.  The ventral sides of the arms are not hyperpigmented.  Also with dry hyperpigmented skin on the lower extremities bilaterally  Psychiatric: His behavior is normal.  Patient was very talkative but could not stay on a certain train of thought for very long  Nursing note and vitals reviewed.   BP (!) 159/99 (BP Location: Right Arm, Patient Position: Sitting, Cuff Size: Large)   Pulse 81   Temp 98.1 F (36.7 C) (Oral)   Ht _0  (1.753 m)   Wt 230 lb 9.6 oz (104.6 kg)   SpO2 96%   BMI 34.05 kg/m  Wt Readings from Last 3 Encounters:  08/28/18 230 lb 9.6 oz (104.6 kg)  07/24/18  220 lb 7.4 oz (100 kg)  07/21/18 221 lb (100.2 kg)     Health Maintenance Due  Topic Date Due  . Hepatitis C Screening  04-Feb-1956  . PNEUMOCOCCAL POLYSACCHARIDE VACCINE AGE 66-64 HIGH RISK  02/23/1960  . FOOT EXAM  02/23/1968  . OPHTHALMOLOGY EXAM  02/23/1968  . HIV Screening  02/22/1973  . COLONOSCOPY  02/23/2008      Lab Results  Component Value Date   TSH 0.639 09/30/2016   Lab Results  Component Value Date   WBC 8.3 07/24/2018   HGB 14.3  07/24/2018   HCT 42.0 07/24/2018   MCV 93.5 07/24/2018   PLT 112 (L) 07/24/2018   Lab Results  Component Value Date   NA 127 (L) 07/26/2018   K 4.5 07/26/2018   CO2 23 07/26/2018   GLUCOSE 189 (H) 07/26/2018   BUN 20 07/26/2018   CREATININE 1.07 07/26/2018   BILITOT 0.7 07/24/2018   ALKPHOS 79 07/24/2018   AST 13 (L) 07/24/2018   ALT 13 07/24/2018   PROT 6.4 (L) 07/24/2018   ALBUMIN 3.6 07/24/2018   CALCIUM 8.8 (L) 07/26/2018   ANIONGAP 10 07/26/2018   Lab Results  Component Value Date   CHOL 111 09/30/2016   Lab Results  Component Value Date   HDL 34 (L) 09/30/2016   Lab Results  Component Value Date   LDLCALC 42 09/30/2016   Lab Results  Component Value Date   TRIG 176 (H) 09/30/2016   Lab Results  Component Value Date   CHOLHDL 3.3 09/30/2016   Lab Results  Component Value Date   HGBA1C 11.6 (H) 07/12/2018      Assessment & Plan:  1. Type 2 diabetes mellitus with hyperglycemia, unspecified whether long term insulin use ([place]) Patient with uncontrolled type 2 diabetes and patient gives a somewhat confusing story about being prescribed Lantus and not receiving prescriptions at discharge from recent behavioral health hospitalization.  New prescription provided for Lantus at 15 units once daily and patient is to follow-up in 2 weeks with the clinical pharmacist and bring his blood sugar diary and glucometer at that time.  He was encouraged to keep his skin well moisturized.  Patient will also need recheck of  blood pressure at his 2-week appointment with clinical pharmacist.  Patient will have labs at today's visit in follow-up of his uncontrolled type 2 diabetes.  Patient's last hemoglobin A1c was 11.6 in March.  Patient will follow-up in 2 months with PCP, sooner if blood sugars remain uncontrolled - Glucose (CBG) - Amb Referral to Clinical Pharmacist - TSH - Comprehensive metabolic panel - Microalbumin/Creatinine Ratio, Urine - Lipid panel - Insulin Glargine (LANTUS SOLOSTAR) 100 UNIT/ML Solostar Pen; Inject 15 Units into the skin daily.  Dispense: 15 mL; Refill: 3  2. Essential hypertension Patient's blood pressure is elevated at today's visit.  Patient's lisinopril 20 mg will be discontinued and prescription provided for losartan 100 mg daily.  Patient will also have complete metabolic panel to check electrolytes/renal function, lipid panel and urine microalbumin creatinine ratio.  Blood pressure will be checked when patient returns in 2 weeks to meet with clinical pharmacist. - Microalbumin/Creatinine Ratio, Urine - Lipid panel - losartan (COZAAR) 100 MG tablet; Take 1 tablet (100 mg total) by mouth daily. To lower blood pressure; stop lisinopril  Dispense: 30 tablet; Refill: 3 * I was unable to review patient's notes from his behavioral health admission on 07/24/18 on review of chart  3. Chronic low back pain with left-sided sciatica, unspecified back pain laterality Patient was offered referral to pain management but states that he cannot afford to attend at this time.  Patient provided with prescription for diclofenac 50 mg twice daily after meal and patient will discontinue use of ibuprofen - CBC with Differential - diclofenac (CATAFLAM) 50 MG tablet; Take 1 tablet (50 mg total) by mouth 2 (two) times daily. After a meal to treat back pain.  Stop ibuprofen  Dispense: 60 tablet; Refill: 5  4. Fatigue, unspecified type Patient with complaint of fatigue.  Patient will have CBC, CMP and  TSH to  look for anemia, blood disorder, electrolyte or liver disorder as well as thyroid disorder.  Also discussed with the patient that his uncontrolled blood sugars on long-term smoking are also likely factors in his fatigue. - CBC with Differential - Comprehensive metabolic panel  5. Encounter for long-term (current) use of medications Patient will have complete metabolic panel and CBC in follow-up of his long-term use of high-risk medications including his current antipsychotic medications per mental health - CBC with Differential - Comprehensive metabolic panel  6. Thrombocytopenia ([place]) Patient with low platelet count of 111 on most recent CBC and this will be repeated along with LFTs as part of complete metabolic panel.  An After Visit Summary was printed and given to the patient.  Allergies as of 08/28/2018      Reactions   Imitrex [sumatriptan] Other (See Comments)   mania      Medication List       Accurate as of Aug 28, 2018 10:32 AM. Always use your most recent med list.        benztropine 0.5 MG tablet Commonly known as:  COGENTIN Take 1 tablet (0.5 mg total) by mouth 2 (two) times daily.   budesonide-formoterol 160-4.5 MCG/ACT inhaler Commonly known as:  SYMBICORT Inhale 2 puffs into the lungs 2 (two) times daily.   diclofenac 50 MG tablet Commonly known as:  CATAFLAM Take 1 tablet (50 mg total) by mouth 2 (two) times daily. After a meal to treat back pain.  Stop ibuprofen   divalproex 500 MG 24 hr tablet Commonly known as:  DEPAKOTE ER Take 3 tablets (1,500 mg total) by mouth at bedtime.   gabapentin 300 MG capsule Commonly known as:  NEURONTIN Take 1 capsule (300 mg total) by mouth 3 (three) times daily.   glimepiride 4 MG tablet Commonly known as:  AMARYL Take 1 tablet (4 mg total) by mouth daily with breakfast.   glucose blood test strip Use QID or as needed to check glucose   Insulin Glargine 100 UNIT/ML Solostar Pen Commonly known as:  Lantus  SoloStar Inject 15 Units into the skin daily.   Insulin Pen Needle 31G X 5 MM Misc 22 Units by Does not apply route daily.   ipratropium 0.02 % nebulizer solution Commonly known as:  ATROVENT Take 2.5 mLs (0.5 mg total) by nebulization 4 (four) times daily. With or instead of the Albuterol   losartan 100 MG tablet Commonly known as:  COZAAR Take 1 tablet (100 mg total) by mouth daily. To lower blood pressure; stop lisinopril   metFORMIN 500 MG tablet Commonly known as:  GLUCOPHAGE Take 1 tablet (500 mg total) by mouth 2 (two) times daily with a meal.   nicotine 21 mg/24hr patch Commonly known as:  NICODERM CQ - dosed in mg/24 hours Place 1 patch (21 mg total) onto the skin daily.   paliperidone 156 MG/ML Susy injection Commonly known as:  Lorayne Bender Sustenna Inject 1 mL (156 mg total) into the muscle once for 1 dose. Due 5/1   prazosin 2 MG capsule Commonly known as:  MINIPRESS Take 1 capsule (2 mg total) by mouth at bedtime.   propranolol 20 MG tablet Commonly known as:  INDERAL Take 1 tablet (20 mg total) by mouth 2 (two) times daily.   risperiDONE 3 MG tablet Commonly known as:  RISPERDAL Take 1 tablet (3 mg total) by mouth at bedtime for 7 days.   True Metrix Meter w/Device Kit Check blood sugars fasting and  at bedtime   TRUEplus Lancets 28G Misc 1 each by Does not apply route 2 (two) times daily.   Ventolin HFA 108 (90 Base) MCG/ACT inhaler Generic drug:  albuterol Inhale 2 puffs into the lungs every 4 (four) hours as needed.   albuterol (2.5 MG/3ML) 0.083% nebulizer solution Commonly known as:  PROVENTIL Take 3 mLs (2.5 mg total) by nebulization every 6 (six) hours as needed for wheezing or shortness of breath.       Follow-up: Return for DM- 2 weeks with CPP (Joshua [name]);PCP in 2 months.   Antony Blackbird, MD

## 2018-08-28 NOTE — Progress Notes (Signed)
Fatigue  Sleeping a lot  DM  Dry Skin  Per pt he's been out of Behavioral health for about 1 mo   Per pt he's been eating the wrong food at home.  Per pt he feels like he should not be forced to stop smoking for him to get prescribed Tramadol

## 2018-08-29 ENCOUNTER — Telehealth: Payer: Self-pay | Admitting: General Practice

## 2018-08-29 LAB — COMPREHENSIVE METABOLIC PANEL WITH GFR
ALT: 7 IU/L (ref 0–44)
AST: 11 IU/L (ref 0–40)
Albumin/Globulin Ratio: 1.9 (ref 1.2–2.2)
Albumin: 3.7 g/dL — ABNORMAL LOW (ref 3.8–4.9)
Alkaline Phosphatase: 95 IU/L (ref 39–117)
BUN/Creatinine Ratio: 22 (ref 10–24)
BUN: 24 mg/dL (ref 8–27)
Bilirubin Total: 0.2 mg/dL (ref 0.0–1.2)
CO2: 20 mmol/L (ref 20–29)
Calcium: 8.9 mg/dL (ref 8.6–10.2)
Chloride: 100 mmol/L (ref 96–106)
Creatinine, Ser: 1.08 mg/dL (ref 0.76–1.27)
GFR calc Af Amer: 86 mL/min/1.73
GFR calc non Af Amer: 74 mL/min/1.73
Globulin, Total: 2 g/dL (ref 1.5–4.5)
Glucose: 262 mg/dL — ABNORMAL HIGH (ref 65–99)
Potassium: 4.9 mmol/L (ref 3.5–5.2)
Sodium: 135 mmol/L (ref 134–144)
Total Protein: 5.7 g/dL — ABNORMAL LOW (ref 6.0–8.5)

## 2018-08-29 LAB — CBC WITH DIFFERENTIAL/PLATELET
Basophils Absolute: 0.1 x10E3/uL (ref 0.0–0.2)
Basos: 1 %
EOS (ABSOLUTE): 0.3 x10E3/uL (ref 0.0–0.4)
Eos: 4 %
Hematocrit: 44 % (ref 37.5–51.0)
Hemoglobin: 14.6 g/dL (ref 13.0–17.7)
Immature Grans (Abs): 0 x10E3/uL (ref 0.0–0.1)
Immature Granulocytes: 0 %
Lymphocytes Absolute: 1.7 x10E3/uL (ref 0.7–3.1)
Lymphs: 22 %
MCH: 32.3 pg (ref 26.6–33.0)
MCHC: 33.2 g/dL (ref 31.5–35.7)
MCV: 97 fL (ref 79–97)
Monocytes Absolute: 0.8 x10E3/uL (ref 0.1–0.9)
Monocytes: 10 %
Neutrophils Absolute: 4.9 x10E3/uL (ref 1.4–7.0)
Neutrophils: 63 %
Platelets: 115 x10E3/uL — ABNORMAL LOW (ref 150–450)
RBC: 4.52 x10E6/uL (ref 4.14–5.80)
RDW: 14.1 % (ref 11.6–15.4)
WBC: 7.9 x10E3/uL (ref 3.4–10.8)

## 2018-08-29 LAB — LIPID PANEL
Chol/HDL Ratio: 4.2 ratio (ref 0.0–5.0)
Cholesterol, Total: 156 mg/dL (ref 100–199)
HDL: 37 mg/dL — ABNORMAL LOW
LDL Calculated: 63 mg/dL (ref 0–99)
Triglycerides: 280 mg/dL — ABNORMAL HIGH (ref 0–149)
VLDL Cholesterol Cal: 56 mg/dL — ABNORMAL HIGH (ref 5–40)

## 2018-08-29 LAB — MICROALBUMIN / CREATININE URINE RATIO
Creatinine, Urine: 348.2 mg/dL
Microalb/Creat Ratio: 33 mg/g{creat} — ABNORMAL HIGH (ref 0–29)
Microalbumin, Urine: 113.7 ug/mL

## 2018-08-29 LAB — TSH: TSH: 1.14 u[IU]/mL (ref 0.450–4.500)

## 2018-08-29 NOTE — Telephone Encounter (Signed)
New Message   Pts states biaglir quick penn needs to be called  Into [facility] on [address], they states have been putting I request

## 2018-08-30 MED ORDER — INSULIN PEN NEEDLE 32G X 4 MM MISC: 11 refills | Status: DC

## 2018-08-30 NOTE — Telephone Encounter (Signed)
Pts wife called in wanting to see if he could get a prescription for the needles to go with Insulin Glargine (BASAGLAR KWIKPEN) 100 UNIT/ML SOPN sent over to [facility] [address] since they are priced a bit high for pt and it would be a lower cost to them if they have a prescription please follow up

## 2018-08-31 DIAGNOSIS — F250 Schizoaffective disorder, bipolar type: Secondary | ICD-10-CM | POA: Diagnosis not present

## 2018-09-01 ENCOUNTER — Other Ambulatory Visit: Payer: Self-pay | Admitting: Family Medicine

## 2018-09-01 ENCOUNTER — Other Ambulatory Visit: Payer: Self-pay | Admitting: Pharmacist

## 2018-09-01 ENCOUNTER — Telehealth: Payer: Self-pay | Admitting: *Deleted

## 2018-09-01 DIAGNOSIS — E1165 Type 2 diabetes mellitus with hyperglycemia: Secondary | ICD-10-CM

## 2018-09-01 MED ORDER — BASAGLAR KWIKPEN 100 UNIT/ML ~~LOC~~ SOPN: 20.0000 [IU] | PEN_INJECTOR | Freq: Every day | SUBCUTANEOUS | 0 refills | Status: DC

## 2018-09-01 MED ORDER — GLIMEPIRIDE 4 MG PO TABS: 4.0000 mg | ORAL_TABLET | Freq: Every day | ORAL | 0 refills | Status: DC

## 2018-09-01 MED ORDER — METFORMIN HCL 500 MG PO TABS: 500.0000 mg | ORAL_TABLET | Freq: Two times a day (BID) | ORAL | 0 refills | Status: DC

## 2018-09-01 MED ORDER — METFORMIN HCL 500 MG PO TABS: 500.0000 mg | ORAL_TABLET | Freq: Two times a day (BID) | ORAL | 3 refills | Status: DC

## 2018-09-01 NOTE — Telephone Encounter (Signed)
Patients call returned.  Patient identified by name and date of birth.  Patient unable to pick up medication.  [facility] state they are waiting on Doctors approval.  Patient advise that a message would be sent to Dr. Chapman Fitch.

## 2018-09-01 NOTE — Telephone Encounter (Signed)
New prescriptions sent to pharmacy 

## 2018-09-01 NOTE — Telephone Encounter (Signed)
route

## 2018-09-01 NOTE — Telephone Encounter (Signed)
Patients call taken.  Patient identified by name and date of birth.  Patient concerned with his blood sugar.  States he has been taking his insulin and it was still high at night.  Patients states that he has been taking his glimepiride but was running low on his pills. Patient stated that his morning CBG was 312.  Patient couldn't remember his evening numbers.  Patient then stated that he had not been given the order for metformin.  Triage consulted CPP who reordered his metformin and sent it to the pharmacy of choice for patient,  Patient acknowledged understanding of advice.

## 2018-09-01 NOTE — Telephone Encounter (Signed)
Please ask patient to increase his Lantus to 20 units once per day

## 2018-09-01 NOTE — Telephone Encounter (Signed)
Joshua [name] wanted know how he could get his platelet count up. He stated he has never been the same since he gave platelets several years ago.

## 2018-09-01 NOTE — Progress Notes (Signed)
Patient ID: Joshua [name], male   DOB: November 13, 1955, 63 y.o.   MRN: [id number]   See phone message. Patient with complaint of his blood sugars remaining elevated and he reported that he needed metformin and he also needs amaryl though he has some currently. Since he reports that his BS was over 300 then he was also asked to increase his Lantus/Basaglar from 15 to 20 units daily

## 2018-09-04 NOTE — Telephone Encounter (Signed)
noted 

## 2018-09-07 ENCOUNTER — Other Ambulatory Visit: Payer: Self-pay

## 2018-09-07 ENCOUNTER — Encounter (HOSPITAL_COMMUNITY): Payer: Self-pay | Admitting: Emergency Medicine

## 2018-09-07 ENCOUNTER — Emergency Department (HOSPITAL_COMMUNITY)
Admission: EM | Admit: 2018-09-07 | Discharge: 2018-09-07 | Disposition: A | Discharge status: Home / Self Care | Payer: Medicare Other | Attending: Emergency Medicine | Admitting: Emergency Medicine

## 2018-09-07 DIAGNOSIS — E119 Type 2 diabetes mellitus without complications: Secondary | ICD-10-CM | POA: Diagnosis not present

## 2018-09-07 DIAGNOSIS — Z794 Long term (current) use of insulin: Secondary | ICD-10-CM | POA: Insufficient documentation

## 2018-09-07 DIAGNOSIS — Z7689 Persons encountering health services in other specified circumstances: Secondary | ICD-10-CM

## 2018-09-07 DIAGNOSIS — I10 Essential (primary) hypertension: Secondary | ICD-10-CM | POA: Diagnosis not present

## 2018-09-07 DIAGNOSIS — F259 Schizoaffective disorder, unspecified: Secondary | ICD-10-CM | POA: Diagnosis not present

## 2018-09-07 DIAGNOSIS — Z79899 Other long term (current) drug therapy: Secondary | ICD-10-CM | POA: Diagnosis not present

## 2018-09-07 DIAGNOSIS — F1210 Cannabis abuse, uncomplicated: Secondary | ICD-10-CM | POA: Insufficient documentation

## 2018-09-07 DIAGNOSIS — Z5181 Encounter for therapeutic drug level monitoring: Secondary | ICD-10-CM | POA: Diagnosis not present

## 2018-09-07 DIAGNOSIS — F17210 Nicotine dependence, cigarettes, uncomplicated: Secondary | ICD-10-CM | POA: Insufficient documentation

## 2018-09-07 NOTE — ED Notes (Signed)
Pt has medication with him.

## 2018-09-07 NOTE — ED Triage Notes (Signed)
Patient here for his Invega injection.  He states that he wants to know the side effects before he takes it.

## 2018-09-07 NOTE — ED Notes (Signed)
Pt given Invega shot in right deltoid.

## 2018-09-07 NOTE — ED Notes (Signed)
Discharge instructions discussed with Pt. Pt verbalized understanding. Pt stable and ambulatory.    

## 2018-09-07 NOTE — ED Provider Notes (Signed)
[facility] EMERGENCY DEPARTMENT Provider Note   CSN: [id number] Arrival date & time: 09/07/18  2044    History   Chief Complaint Chief Complaint  Patient presents with  . Injection Needed    HPI Joshua [name] is a 63 y.o. male.     63 year old male with a history of bipolar disorder, schizoaffective disorder, diabetes, hypertension, chronic pain presents to the ED requesting that we administer his Invega.  He was due for injection on 08/25/2018, but did not feel he needed the medication and did not go have it administered.  Reports that his family has been telling him that he does need the medication, so he decided to come to the ED tonight.  He is not having any suicidal or homicidal thoughts.  No hallucinations.  Is followed by the [facility] for primary care.  Is supposed to be following up with [facility] regarding his behavioral health diagnoses.  The history is provided by the patient. No language interpreter was used.    Past Medical History:  Diagnosis Date  . Back pain   . Bipolar disorder ([place])   . Cannabis use disorder, moderate, dependence ([place])   . Chronic pain   . Diabetes mellitus without complication ([place])   . Generalized anxiety disorder   . Headache   . Hypertension   . Schizoaffective disorder [facility])     Patient Active Problem List   Diagnosis Date Noted  . Schizoaffective disorder ([place]) 07/24/2018  . Blister of right foot 10/29/2016  . Chronic pain syndrome 10/14/2016  . Osteoarthritis 10/14/2016  . T2DM (type 2 diabetes mellitus) ([place]) 10/14/2016  . HTN (hypertension) 10/14/2016  . Schizoaffective disorder, bipolar type ([place]) 05/10/2016  . Generalized anxiety disorder 05/04/2016    Past Surgical History:  Procedure Laterality Date  . APPENDECTOMY  1970        Home Medications    Prior to Admission medications   Medication Sig Start Date End Date Taking? Authorizing Provider  albuterol (PROVENTIL)  (2.5 MG/3ML) 0.083% nebulizer solution Take 3 mLs (2.5 mg total) by nebulization every 6 (six) hours as needed for wheezing or shortness of breath. 07/12/18   Argentina Donovan, PA-C  benztropine (COGENTIN) 0.5 MG tablet Take 1 tablet (0.5 mg total) by mouth 2 (two) times daily. 07/27/18   [name], MD  Blood Glucose Monitoring Suppl (TRUE METRIX METER) w/Device KIT Check blood sugars fasting and at bedtime 07/27/18   [name], MD  budesonide-formoterol [facility]) 160-4.5 MCG/ACT inhaler Inhale 2 puffs into the lungs 2 (two) times daily. 07/12/18   Argentina Donovan, PA-C  diclofenac (CATAFLAM) 50 MG tablet Take 1 tablet (50 mg total) by mouth 2 (two) times daily. After a meal to treat back pain.  Stop ibuprofen 08/28/18   Fulp, Cammie, MD  divalproex (DEPAKOTE ER) 500 MG 24 hr tablet Take 3 tablets (1,500 mg total) by mouth at bedtime. 07/27/18   [name], MD  gabapentin (NEURONTIN) 300 MG capsule Take 1 capsule (300 mg total) by mouth 3 (three) times daily. 07/27/18   [name], MD  glimepiride (AMARYL) 4 MG tablet Take 1 tablet (4 mg total) by mouth daily with breakfast. 09/01/18   Fulp, Cammie, MD  glucose blood test strip Use QID or as needed to check glucose 08/04/18   Wurst, [place], PA-C  Insulin Glargine (BASAGLAR KWIKPEN) 100 UNIT/ML SOPN Inject 0.2 mLs (20 Units total) into the skin daily. 09/01/18   Antony Blackbird, MD  Insulin Pen Needle (TRUEPLUS PEN NEEDLES) 32G X 4 MM MISC Use as directed to inject insulin 08/30/18   Fulp, Cammie, MD  ipratropium (ATROVENT) 0.02 % nebulizer solution Take 2.5 mLs (0.5 mg total) by nebulization 4 (four) times daily. With or instead of the Albuterol 07/17/18   Rodriguez-Southworth, Sunday Spillers, PA-C  losartan (COZAAR) 100 MG tablet Take 1 tablet (100 mg total) by mouth daily. To lower blood pressure; stop lisinopril 08/28/18   Fulp, Cammie, MD  metFORMIN (GLUCOPHAGE) 500 MG tablet Take 1 tablet (500 mg total) by mouth 2 (two) times daily with a meal. 09/01/18    Fulp, Cammie, MD  nicotine (NICODERM CQ - DOSED IN MG/24 HOURS) 21 mg/24hr patch Place 1 patch (21 mg total) onto the skin daily. Patient not taking: Reported on 08/28/2018 07/28/18   [name], MD  paliperidone (INVEGA SUSTENNA) 156 MG/ML SUSY injection Inject 1 mL (156 mg total) into the muscle once for 1 dose. Due 5/1 07/27/18 07/27/18  [name], MD  prazosin (MINIPRESS) 2 MG capsule Take 1 capsule (2 mg total) by mouth at bedtime. 07/27/18   [name], MD  propranolol (INDERAL) 20 MG tablet Take 1 tablet (20 mg total) by mouth 2 (two) times daily. 07/27/18   [name], MD  risperiDONE (RISPERDAL) 3 MG tablet Take 1 tablet (3 mg total) by mouth at bedtime for 7 days. 07/27/18 08/03/18  [name], MD  TRUEplus Lancets 28G MISC 1 each by Does not apply route 2 (two) times daily. 07/27/18   [name], MD  VENTOLIN HFA 108 [phone] Base) MCG/ACT inhaler Inhale 2 puffs into the lungs every 4 (four) hours as needed. Patient taking differently: Inhale 2 puffs into the lungs every 4 (four) hours as needed for wheezing or shortness of breath.  07/12/18   Argentina Donovan, PA-C    Family History Family History  Problem Relation Age of Onset  . Diabetes Mother   . Cancer Mother        unsure what type  . Heart disease Mother   . Hypertension Mother   . Dementia Father   . Diabetes Brother   . Dementia Maternal Grandfather   . Cancer Paternal Grandmother 9       breast  . Mental illness Neg Hx     Social History Social History   Tobacco Use  . Smoking status: Current Every Day Smoker    Packs/day: 2.00    Types: Cigarettes  . Smokeless tobacco: Never Used  Substance Use Topics  . Alcohol use: Not Currently  . Drug use: Yes    Types: Marijuana    Comment: "have changed to hemp now" 09/17/17     Allergies   Imitrex [sumatriptan]   Review of Systems Review of Systems Ten systems reviewed and are negative for acute change, except as noted in the HPI.    Physical  Exam Updated Vital Signs BP (!) 180/158 (BP Location: Left Arm)   Pulse 99   Temp 98.1 F (36.7 C) (Oral)   Resp 19   Ht _0  (1.727 m)   Wt 104.5 kg   SpO2 98%   BMI 35.03 kg/m   Physical Exam Vitals signs and nursing note reviewed.  Constitutional:      General: He is not in acute distress.    Appearance: He is well-developed. He is not diaphoretic.     Comments: Nontoxic appearing and in NAD  HENT:     Head: Normocephalic and  atraumatic.  Eyes:     General: No scleral icterus.    Conjunctiva/sclera: Conjunctivae normal.  Neck:     Musculoskeletal: Normal range of motion.  Pulmonary:     Effort: Pulmonary effort is normal. No respiratory distress.     Comments: Respirations even and unlabored Musculoskeletal: Normal range of motion.  Skin:    General: Skin is warm and dry.     Coloration: Skin is not pale.     Findings: No erythema or rash.  Neurological:     Mental Status: He is alert and oriented to person, place, and time.  Psychiatric:        Behavior: Behavior normal.     Comments: Patient calm and cooperative. Not reacting to internal stimuli. No active hallucinations.      ED Treatments / Results  Labs (all labs ordered are listed, but only abnormal results are displayed) Labs Reviewed - No data to display  EKG None  Radiology No results found.  Procedures Procedures (including critical care time)  Medications Ordered in ED Medications - No data to display   10:46 PM Spoke with Patriciaann Clan, PA-C of [facility]. Simon, PA-C states OK to give medication in the ED tonight. Given that patient is outside the window from his initial injection on 07/26/18, should take his next shot on June 1st and then continue with monthly injections thereafter. Will give information for [facility] for further follow up.   Initial Impression / Assessment and Plan / ED Course  I have reviewed the triage vital signs and the nursing notes.  Pertinent labs & imaging results  that were available during my care of the patient were reviewed by me and considered in my medical decision making (see chart for details).        63 year old male presents to the emergency department requesting that his Lorayne Bender be administered.  Missed his injection due on 08/25/2018.  Patient brought his medication to the ED with him.  This was administered by nursing following clearance by Tery Sanfilippo of psychiatry.  Patient tolerated well without immediate complications.  Patient discharged in stable condition with no unaddressed concerns.   Final Clinical Impressions(s) / ED Diagnoses   Final diagnoses:  Encounter for medication administration    ED Discharge Orders    None       Antonietta Breach, PA-C 09/07/18 2258    Julianne Rice, MD 09/09/18 [phone]

## 2018-09-12 ENCOUNTER — Encounter: Payer: Medicare Other | Attending: Family Medicine | Admitting: Registered"

## 2018-09-12 ENCOUNTER — Other Ambulatory Visit: Payer: Self-pay

## 2018-09-12 ENCOUNTER — Encounter: Payer: Self-pay | Admitting: Pharmacist

## 2018-09-12 ENCOUNTER — Ambulatory Visit: Payer: Medicare Other | Attending: Family Medicine | Admitting: Pharmacist

## 2018-09-12 DIAGNOSIS — F1290 Cannabis use, unspecified, uncomplicated: Secondary | ICD-10-CM | POA: Insufficient documentation

## 2018-09-12 DIAGNOSIS — F17200 Nicotine dependence, unspecified, uncomplicated: Secondary | ICD-10-CM | POA: Diagnosis not present

## 2018-09-12 DIAGNOSIS — E119 Type 2 diabetes mellitus without complications: Secondary | ICD-10-CM | POA: Insufficient documentation

## 2018-09-12 DIAGNOSIS — Z79899 Other long term (current) drug therapy: Secondary | ICD-10-CM | POA: Diagnosis not present

## 2018-09-12 DIAGNOSIS — E1165 Type 2 diabetes mellitus with hyperglycemia: Secondary | ICD-10-CM | POA: Diagnosis present

## 2018-09-12 DIAGNOSIS — Z794 Long term (current) use of insulin: Secondary | ICD-10-CM | POA: Insufficient documentation

## 2018-09-12 DIAGNOSIS — Z9111 Patient's noncompliance with dietary regimen: Secondary | ICD-10-CM | POA: Diagnosis not present

## 2018-09-12 DIAGNOSIS — Z8249 Family history of ischemic heart disease and other diseases of the circulatory system: Secondary | ICD-10-CM | POA: Diagnosis not present

## 2018-09-12 DIAGNOSIS — Z833 Family history of diabetes mellitus: Secondary | ICD-10-CM | POA: Insufficient documentation

## 2018-09-12 DIAGNOSIS — I10 Essential (primary) hypertension: Secondary | ICD-10-CM | POA: Diagnosis not present

## 2018-09-12 DIAGNOSIS — J9801 Acute bronchospasm: Secondary | ICD-10-CM

## 2018-09-12 MED ORDER — BASAGLAR KWIKPEN 100 UNIT/ML ~~LOC~~ SOPN: 30.0000 [IU] | PEN_INJECTOR | Freq: Every day | SUBCUTANEOUS | 2 refills | Status: DC

## 2018-09-12 MED ORDER — DULAGLUTIDE 0.75 MG/0.5ML ~~LOC~~ SOAJ: 0.7500 mg | SUBCUTANEOUS | 0 refills | Status: DC

## 2018-09-12 MED ORDER — ALBUTEROL SULFATE (2.5 MG/3ML) 0.083% IN NEBU: 2.5000 mg | INHALATION_SOLUTION | Freq: Four times a day (QID) | RESPIRATORY_TRACT | 2 refills | Status: DC | PRN

## 2018-09-12 MED ORDER — METFORMIN HCL 1000 MG PO TABS: 1000.0000 mg | ORAL_TABLET | Freq: Two times a day (BID) | ORAL | 2 refills | Status: DC

## 2018-09-12 NOTE — Progress Notes (Signed)
S:    PCP: Dr. Chapman Fitch No chief complaint on file.  Patient arrives in good spirits. Presents for diabetes management at the request of Dr. Chapman Fitch. Patient was referred on 08/28/18. Since then, pt has been asked to increase insulin to 20 units a day. He endorses compliance with Basaglar and metformin but has not been taking his glimepiride.  Of note, pt endorses daily marijuana use and reports smoking before his appt. He has a hx of GAD and schizoaffective disorder as well. He is somewhat of a poor historian and frequently diverts from his diabetes to talk about marijuana use and various family matters.   Family/Social History:  - FHx: DM (mother, brother), heart disease (father), HTN (father) - Tobacco: current every day smoker (2 packs per day) - Alcohol: denies  - Other substances: endorses daily use of marijuana   Insurance coverage/medication affordability:  - Medicare   Patient denies adherence with medications.   Current diabetes medications include:  - Glimepiride 4 mg daily (not taking) - Metformin 500 mg BID - Lantus 20 units daily  Current hypertension medications include:  - Losartan 100 mg daily  Patient denies hypoglycemic events.  Patient reported dietary habits:  - Eats 1 meal/day (supper) secondary to poor appetite throughout the day - Reports eating a large supper later at night before bed - Non-compliant with diet: endorses high intake of carbs at supper (lists potato chips as an example)  Patient-reported exercise habits:  - Denies    Patient reports polyuria, polydipsia.  Patient reports neuropathy. Patient denies visual changes. Patient reports self foot exams.   O:  POCT glucose: not taken - pt reports taking this morning and BG was 350 Home fasting CBG: 180 - 300s  2 hour post-prandial/random CBG: 300-400s.  Lab Results  Component Value Date   HGBA1C 11.6 (H) 07/12/2018   There were no vitals filed for this visit.  Lipid Panel      Component Value Date/Time   CHOL 156 08/28/2018 1130   TRIG 280 (H) 08/28/2018 1130   HDL 37 (L) 08/28/2018 1130   CHOLHDL 4.2 08/28/2018 1130   CHOLHDL 3.3 09/30/2016 0651   VLDL 35 09/30/2016 0651   LDLCALC 63 08/28/2018 1130   Clinical ASCVD: No  The 10-year ASCVD risk score Mikey Bussing DC Jr., et al., 2013) is: 25.1%   Values used to calculate the score:     Age: 30 years     Sex: Male     Is Non-Hispanic African American: No     Diabetic: Yes     Tobacco smoker: Yes     Systolic Blood Pressure: 756 mmHg     Is BP treated: Yes     HDL Cholesterol: 37 mg/dL     Total Cholesterol: 156 mg/dL    A/P: Diabetes longstanding currently uncontrolled. Patient is able to verbalize appropriate hypoglycemia management plan. Patient is not adherent with medication. Control is suboptimal due to non-compliance to medications and lifestyle.  Pt with hypergylcemia at home with associated symptoms. Will have him increase Basaglar. He is not adherent to his SU but is interested in weight loss and medication regimen simplification. Will hold off on glimepiride and start Trulicity 4.33 mg weekly. Pt denies hx of thyroid cancer or pancreatitis. No family hc of thyroid cancer.   -Increased dose of Basaglar to 30 units daily. -Start Trulicity 2.95 mg weekly on Mondays. -Increase metformin to 1000 mg BID. Pt will take two 500 mg tablets BID until he  runs out. Rx sent for 1000 mg BID.  -Hold glimepiride for now. -Extensively discussed pathophysiology of DM, recommended lifestyle interventions, dietary effects on glycemic control -Counseled on s/sx of and management of hypoglycemia -Next A1C anticipated 09/2018.   ASCVD risk - primary prevention in patient with DM. Last LDL < 70. ASCVD risk score is >20%  - High intensity statin indicated for ASCVD risk reduction. -Recommend atorvastatin 40 mg.  -Will address at future appt.   HM:  - Pneumovax indicated; will address at next appt.   Written patient  instructions provided. Total time in face to face counseling 30 minutes.   Follow up Pharmacist Clinic Visit in 1 month.     Benard Halsted, PharmD, [place] [phone]

## 2018-09-12 NOTE — Patient Instructions (Addendum)
Talk to your doctor about the extra medication you are taking for your foot pain. Read the handout on foot care and check your feet daily Use the chart for ideas for balanced eating. Start writing down your blood sugar numbers, first thing in the morning and 2 hours after a meal. Bring your log book with you to your doctor. Try to eat 3 meals per day, avoid skipping meals, Use the Low Blood Sugar Handout to treat low blood sugar

## 2018-09-12 NOTE — Patient Instructions (Signed)
Thank you for coming to see me today. Please do the following:  1. Take metformin 500 mg, 2 tablets twice a day until you run out. Start the 1000 mg, one tablet twice a day when you run out of the 500 mg tablets  2. Take 30 units of Basaglar in the morning.  3. Take 0.75 mg (one shot) of Trulicity once a week.  4. Continue checking blood sugars at home.  5. Continue making the lifestyle changes we've discussed together during our visit. Diet and exercise play a significant role in improving your blood sugars.  Follow-up with me in 1 month.  Hypoglycemia or low blood sugar:   Low blood sugar can happen quickly and may become an emergency if not treated right away.   While this shouldn't happen often, it can be brought upon if you skip a meal or do not eat enough. Also, if your insulin or other diabetes medications are dosed too high, this can cause your blood sugar to go to low.   Warning signs of low blood sugar include: 1. Feeling shaky or dizzy 2. Feeling weak or tired  3. Excessive hunger 4. Feeling anxious or upset  5. Sweating even when you aren't exercising  What to do if I experience low blood sugar? 1. Check your blood sugar with your meter. If lower than 70, proceed to step 2.  2. Treat with 3-4 glucose tablets or 3 packets of regular sugar. If these aren't around, you can try hard candy. Yet another option would be to drink 4 ounces of fruit juice or 6 ounces of REGULAR soda.  3. Re-check your sugar in 15 minutes. If it is still below 70, do what you did in step 2 again. If has come back up, go ahead and eat a snack or small meal at this time.

## 2018-09-12 NOTE — Progress Notes (Signed)
Diabetes Self-Management Education  Visit Type: First/Initial  Appt. Start Time: 1400 Appt. End Time: 7253  09/12/2018  Joshua [name], identified by name and date of birth, is a 63 y.o. male with a diagnosis of Diabetes: Type 2.   ASSESSMENT  There were no vitals taken for this visit. There is no height or weight on file to calculate BMI.   Pt states his FBG was 357 mg/dL this morning and his vision is still blurry. Pt stated he has not been leaving insulin pen injected for a few seconds after, instead just pulling it straight out. RD reviewed correct technique and patient demonstrated competency with teach back.  Per chart A1c was 6.3% last year, now 11.6%. Pt had been upset by the thought of having to take medication for it the rest of his life, but states he will do what he needs to do to have better health.  Pt states he has pain in back and feet. Pt states he has a friend who shared his prescription medication, gabapentin higher dose than his prescription and he states that has helped reduce pain in his feet. RD stressed importance of sharing this information with his MD.  Patient states he likes to look around his environment and notice things and take time to observe his surroundings and later he recalls experiences. Pt states when he is at home he feels like the earth or gravity is pulling him down to the ground, like spirits below are pulling on him. Pt states however when he is other places he feels lighter. Pt states his is concerned about how he thinks sometimes, for instance he said that he imagined if he jabbed someone with his insulin pen it could kill them.  Pt states he decides what to eat according to his smell. states he can also smell people, asked me if I was wearing any perfume and said he can't smell me.   Pt states he was fasting because he thinks he should kill his appetite and it also makes him feel closer to GOD. Pt states bingeing at night (see diet  recall).  Diabetes Self-Management Education - 09/12/18 1425      Visit Information   Visit Type  First/Initial      Initial Visit   Diabetes Type  Type 2    Are you currently following a meal plan?  No    Are you taking your medications as prescribed?  Yes   30 u insulin am, 1000 metformin BID,   Date Diagnosed  6 yrs ago      Health Coping   How would you rate your overall health?  Good      Psychosocial Assessment   Patient Belief/Attitude about Diabetes  Other (comment)   acceptance   What is the last grade level you completed in school?  8th      Complications   Last HgB A1C per patient/outside source  11.6 %    How often do you check your blood sugar?  1-2 times/day    Fasting Blood glucose range (mg/dL)  >200    Have you had a dilated eye exam in the past 12 months?  No    Have you had a dental exam in the past 12 months?  No    Are you checking your feet?  No      Dietary Intake   Breakfast  none    Dinner  bread, chicken, 1 slize pizza, kurts bbq chips, french onion dip,  blueberry muffin,  resese pieces, plum, wheat crackers    Beverage(s)  a lot of coffee with a lot of creamer, sparkle water,       Exercise   Exercise Type  ADL's    How many days per week to you exercise?  0    How many minutes per day do you exercise?  0    Total minutes per week of exercise  0      Patient Education   Previous Diabetes Education  Yes (please comment)   MD earlier today   Nutrition management   Role of diet in the treatment of diabetes and the relationship between the three main macronutrients and blood glucose level    Medications  Taught/reviewed insulin injection, site rotation, insulin storage and needle disposal.    Monitoring  Purpose and frequency of SMBG.    Acute complications  Taught treatment of hypoglycemia - the 15 rule.      Individualized Goals (developed by patient)   Nutrition  General guidelines for healthy choices and portions discussed    Medications   Other (comment)   tell MD how you are taking pain med   Monitoring   test my blood glucose as discussed      Outcomes   Expected Outcomes  Demonstrated interest in learning. Expect positive outcomes    Future DMSE  2 months    Program Status  Not Completed      Individualized Plan for Diabetes Self-Management Training:   Learning Objective:  Patient will have a greater understanding of diabetes self-management. Patient education plan is to attend individual and/or group sessions per assessed needs and concerns.    Patient Instructions  Talk to your doctor about the extra medication you are taking for your foot pain. Read the handout on foot care and check your feet daily Use the chart for ideas for balanced eating. Start writing down your blood sugar numbers, first thing in the morning and 2 hours after a meal. Bring your log book with you to your doctor. Try to eat 3 meals per day, avoid skipping meals, Use the Low Blood Sugar Handout to treat low blood sugar   Expected Outcomes:  Demonstrated interest in learning. Expect positive outcomes  Education material provided: Foot Care, Low Blood Sugar, Let's Count Carbs, MyPlate  If problems or questions, patient to contact team via:  Phone  Future DSME appointment: 2 months

## 2018-09-13 ENCOUNTER — Encounter: Payer: Self-pay | Admitting: Pharmacist

## 2018-09-15 DIAGNOSIS — F250 Schizoaffective disorder, bipolar type: Secondary | ICD-10-CM | POA: Diagnosis not present

## 2018-09-21 DIAGNOSIS — G894 Chronic pain syndrome: Secondary | ICD-10-CM | POA: Diagnosis not present

## 2018-09-22 ENCOUNTER — Other Ambulatory Visit: Payer: Self-pay | Admitting: Pharmacist

## 2018-09-22 DIAGNOSIS — F250 Schizoaffective disorder, bipolar type: Secondary | ICD-10-CM | POA: Diagnosis not present

## 2018-10-04 DIAGNOSIS — F250 Schizoaffective disorder, bipolar type: Secondary | ICD-10-CM | POA: Diagnosis not present

## 2018-10-10 ENCOUNTER — Ambulatory Visit: Payer: Medicare Other | Admitting: Registered"

## 2018-10-13 ENCOUNTER — Encounter: Payer: Self-pay | Admitting: Pharmacist

## 2018-10-13 ENCOUNTER — Ambulatory Visit: Payer: Medicare Other | Attending: Family Medicine | Admitting: Pharmacist

## 2018-10-13 ENCOUNTER — Other Ambulatory Visit: Payer: Self-pay

## 2018-10-13 DIAGNOSIS — E1165 Type 2 diabetes mellitus with hyperglycemia: Secondary | ICD-10-CM | POA: Diagnosis not present

## 2018-10-13 DIAGNOSIS — Z9111 Patient's noncompliance with dietary regimen: Secondary | ICD-10-CM | POA: Diagnosis not present

## 2018-10-13 DIAGNOSIS — F17210 Nicotine dependence, cigarettes, uncomplicated: Secondary | ICD-10-CM | POA: Insufficient documentation

## 2018-10-13 DIAGNOSIS — Z79899 Other long term (current) drug therapy: Secondary | ICD-10-CM | POA: Diagnosis not present

## 2018-10-13 DIAGNOSIS — E119 Type 2 diabetes mellitus without complications: Secondary | ICD-10-CM | POA: Insufficient documentation

## 2018-10-13 DIAGNOSIS — Z9114 Patient's other noncompliance with medication regimen: Secondary | ICD-10-CM | POA: Insufficient documentation

## 2018-10-13 DIAGNOSIS — Z833 Family history of diabetes mellitus: Secondary | ICD-10-CM | POA: Insufficient documentation

## 2018-10-13 DIAGNOSIS — Z8249 Family history of ischemic heart disease and other diseases of the circulatory system: Secondary | ICD-10-CM | POA: Insufficient documentation

## 2018-10-13 DIAGNOSIS — Z7984 Long term (current) use of oral hypoglycemic drugs: Secondary | ICD-10-CM | POA: Insufficient documentation

## 2018-10-13 LAB — GLUCOSE, POCT (MANUAL RESULT ENTRY): POC Glucose: 93 mg/dL (ref 70–99)

## 2018-10-13 MED ORDER — TRULICITY 0.75 MG/0.5ML ~~LOC~~ SOAJ: 0.7500 mg | SUBCUTANEOUS | 2 refills | Status: DC

## 2018-10-13 MED ORDER — METFORMIN HCL 1000 MG PO TABS: 1000.0000 mg | ORAL_TABLET | Freq: Two times a day (BID) | ORAL | 0 refills | Status: DC

## 2018-10-13 MED ORDER — ATORVASTATIN CALCIUM 40 MG PO TABS: 40.0000 mg | ORAL_TABLET | Freq: Every day | ORAL | 0 refills | Status: DC

## 2018-10-13 NOTE — Progress Notes (Signed)
S:    PCP: Dr. Chapman Fitch No chief complaint on file.  Patient arrives in good spirits. Presents for diabetes management at the request of Dr. Chapman Fitch. Patient was referred on 08/28/18. I saw him on 09/12/18. We increased his Basaglar dose to 30 units daily and initiated Trulicity.   Family/Social History:  - FHx: DM (mother, brother), heart disease (father), HTN (father) - Tobacco: current every day smoker (2 packs per day) - Alcohol: denies  - Other substances: endorses daily use of marijuana   Insurance coverage/medication affordability:  - Medicare   Patient reports adherence with medications.   Current diabetes medications include:  - Metformin 1000 mg BID - Basaglar 30 units daily - Trulicity 3.14 mg weekly   Current hypertension medications include:  - Losartan 100 mg daily  Patient denies hypoglycemic events.  Patient reported dietary habits:  - Eats 1 meal/day (supper) secondary to poor appetite throughout the day - Reports eating a large supper later at night before bed - Non-compliant with diet: endorses high intake of carbs at supper (lists potato chips as an example)  Patient-reported exercise habits:  - Denies    Patient denies polyuria, polydipsia.  Patient reports baseline neuropathy. Patient denies visual changes. Patient reports self foot exams.   O:  POCT glucose: 93 7-day avg: 120s 14-day: avg 120s 30 avg: 120s  Lab Results  Component Value Date   HGBA1C 11.6 (H) 07/12/2018   There were no vitals filed for this visit.  Lipid Panel     Component Value Date/Time   CHOL 156 08/28/2018 1130   TRIG 280 (H) 08/28/2018 1130   HDL 37 (L) 08/28/2018 1130   CHOLHDL 4.2 08/28/2018 1130   CHOLHDL 3.3 09/30/2016 0651   VLDL 35 09/30/2016 0651   LDLCALC 63 08/28/2018 1130   Clinical ASCVD: No  The 10-year ASCVD risk score Mikey Bussing DC Jr., et al., 2013) is: 25.1%   Values used to calculate the score:     Age: 63 years     Sex: Male     Is Non-Hispanic  African American: No     Diabetic: Yes     Tobacco smoker: Yes     Systolic Blood Pressure: 970 mmHg     Is BP treated: Yes     HDL Cholesterol: 37 mg/dL     Total Cholesterol: 156 mg/dL    A/P: Diabetes longstanding currently uncontrolled but sugars have improved greatly since medication adjustment. Patient is able to verbalize appropriate hypoglycemia management plan. Patient is adherent with medication. Control is suboptimal due to non-compliance to medications and lifestyle.   His control has greatly improved. Commended him for adherence to medications. In the future, he may need to decrease insulin if Trulicity continues to decrease his need of exogenous insulin.    -Continue Basaglar 30 units daily. -ContinueTrulicity 2.63 mg weekly on Mondays. -Continue metformin 1000 mg BID  -Extensively discussed pathophysiology of DM, recommended lifestyle interventions, dietary effects on glycemic control -Counseled on s/sx of and management of hypoglycemia -Next A1C anticipated 09/2018 - will defer to PCP next month.   ASCVD risk - primary prevention in patient with DM. Last LDL < 70. ASCVD risk score is >20%  - High intensity statin indicated for ASCVD risk reduction. -Start atorvastatin 40 mg daily.  -LFTs recommended in 4-6 weeks  HM:  - Pneumovax indicated; will defer to PCP at next appt.   Written patient instructions provided. Total time in face to face counseling 30 minutes.  Follow up w/ PCP in July.      Benard Halsted, PharmD, [place] [phone]

## 2018-10-13 NOTE — Patient Instructions (Signed)
Thank you for coming to see me today. Please do the following:  1. Continue metformin, insulin and Trulicity.  2. Continue checking blood sugars at home.  3. Continue making the lifestyle changes we've discussed together during our visit. Diet and exercise play a significant role in improving your blood sugars.  4. Follow-up with PCP next month.    Hypoglycemia or low blood sugar:   Low blood sugar can happen quickly and may become an emergency if not treated right away.   While this shouldn't happen often, it can be brought upon if you skip a meal or do not eat enough. Also, if your insulin or other diabetes medications are dosed too high, this can cause your blood sugar to go to low.   Warning signs of low blood sugar include: 1. Feeling shaky or dizzy 2. Feeling weak or tired  3. Excessive hunger 4. Feeling anxious or upset  5. Sweating even when you aren't exercising  What to do if I experience low blood sugar? 1. Check your blood sugar with your meter. If lower than 70, proceed to step 2.  2. Treat with 3-4 glucose tablets or 3 packets of regular sugar. If these aren't around, you can try hard candy. Yet another option would be to drink 4 ounces of fruit juice or 6 ounces of REGULAR soda.  3. Re-check your sugar in 15 minutes. If it is still below 70, do what you did in step 2 again. If has come back up, go ahead and eat a snack or small meal at this time.

## 2018-10-24 DIAGNOSIS — M545 Low back pain: Secondary | ICD-10-CM | POA: Diagnosis not present

## 2018-10-24 DIAGNOSIS — M5416 Radiculopathy, lumbar region: Secondary | ICD-10-CM | POA: Diagnosis not present

## 2018-10-25 DIAGNOSIS — F250 Schizoaffective disorder, bipolar type: Secondary | ICD-10-CM | POA: Diagnosis not present

## 2018-10-27 ENCOUNTER — Encounter (HOSPITAL_COMMUNITY): Payer: Self-pay | Admitting: Emergency Medicine

## 2018-10-27 ENCOUNTER — Ambulatory Visit (HOSPITAL_COMMUNITY)
Admission: EM | Admit: 2018-10-27 | Discharge: 2018-10-27 | Disposition: A | Discharge status: Home / Self Care | Payer: Medicare Other | Attending: Family Medicine | Admitting: Family Medicine

## 2018-10-27 DIAGNOSIS — S51832A Puncture wound without foreign body of left forearm, initial encounter: Secondary | ICD-10-CM

## 2018-10-27 DIAGNOSIS — W540XXA Bitten by dog, initial encounter: Secondary | ICD-10-CM | POA: Diagnosis not present

## 2018-10-27 DIAGNOSIS — I10 Essential (primary) hypertension: Secondary | ICD-10-CM

## 2018-10-27 MED ORDER — AMOXICILLIN-POT CLAVULANATE 875-125 MG PO TABS: 1.0000 | ORAL_TABLET | Freq: Two times a day (BID) | ORAL | 0 refills | Status: DC

## 2018-10-27 MED ORDER — TRAMADOL HCL 50 MG PO TABS: 50.0000 mg | ORAL_TABLET | Freq: Four times a day (QID) | ORAL | 0 refills | Status: DC | PRN

## 2018-10-27 NOTE — Discharge Instructions (Addendum)
Be aware, pain medications may cause drowsiness. Please do not drive, operate heavy machinery or make important decisions while on this medication, it can cloud your judgement.  

## 2018-10-27 NOTE — ED Triage Notes (Signed)
Pt states three days ago his dog scratched or bit his L arm, L arm is red and warm. Dog is up to date.

## 2018-10-27 NOTE — ED Provider Notes (Signed)
[place]   [id number] 10/27/18 Arrival Time: 1009  ASSESSMENT & PLAN:  1. Dog bite, initial encounter   2. Uncontrolled hypertension    Meds ordered this encounter  Medications  . amoxicillin-clavulanate (AUGMENTIN) 875-125 MG tablet    Sig: Take 1 tablet by mouth every 12 (twelve) hours.    Dispense:  20 tablet    Refill:  0  . traMADol (ULTRAM) 50 MG tablet    Sig: Take 1 tablet (50 mg total) by mouth every 6 (six) hours as needed.    Dispense:  15 tablet    Refill:  0   [place] Controlled Substances Registry consulted for this patient. I feel the risk/benefit ratio today is favorable for proceeding with this prescription for a controlled substance. Medication sedation precautions given.  Follow-up Information      [place].   Specialty: Urgent Care Why: If your arm wound is not improving over the next 24-48 hours. Contact information: [place] [phone]         Has f/u with PCP within one week to recheck BP.  Reviewed expectations re: course of current medical issues. Questions answered. Outlined signs and symptoms indicating need for more acute intervention. Patient verbalized understanding. After Visit Summary given.  SUBJECTIVE: History from: patient. Joshua [name] is a 63 y.o. R-handed male who reports a dog bite to L distal forearm. 2-3 d ago. Reports increasing redness and discomfort; 'aches'. No specific aggravating or alleviating factors reported. No drainage or bleeding. Afebrile. No extremity sensation changes or weakness. Reports his dog is UTD on vaccinations. OTC analgesics without relief. Pain at wound is affecting sleep.  Reports last Td < 5 years ago.  Past Surgical History:  Procedure Laterality Date  . APPENDECTOMY  1970    Increased blood pressure noted today. Reports that he is treated for HTN.  He reports no chest pain on exertion, no dyspnea on  exertion, no swelling of ankles, no orthostatic dizziness or lightheadedness, no orthopnea or paroxysmal nocturnal dyspnea, no palpitations and no intermittent claudication symptoms.  ROS: As per HPI. All other systems negative.   OBJECTIVE:  Vitals:   10/27/18 1049  BP: (!) 170/107  Pulse: 70  Resp: 18  Temp: 97.9 F (36.6 C)  SpO2: 100%    General appearance: alert; no distress HEENT: [place]; AT Neck: supple with FROM Resp: unlabored respirations Extremities: . LUE: warm and well perfused; well localized moderate tenderness over left distal inner forearm around two puncture marks, without active drainage or bleeding; without gross deformities; with mild localized swelling; with no bruising; wrist and fingers ROM: normal without reported discomfort CV: brisk extremity capillary refill of LUE; 2+ radial pulse of LUE. Skin: warm and dry; no visible rashes Neurologic: gait normal; normal reflexes of RUE and LUE; normal sensation of RUE and LUE; normal strength of RUE and LUE Psychological: alert and cooperative; normal mood and affect   Allergies  Allergen Reactions  . Imitrex [Sumatriptan] Other (See Comments)    mania    Past Medical History:  Diagnosis Date  . Back pain   . Bipolar disorder ([place])   . Cannabis use disorder, moderate, dependence ([place])   . Chronic pain   . Diabetes mellitus without complication ([place])   . Generalized anxiety disorder   . Headache   . Hypertension   . Schizoaffective disorder [facility])    Social History   Socioeconomic History  . Marital  status: Married    Spouse name: Not on file  . Number of children: Not on file  . Years of education: Not on file  . Highest education level: Not on file  Occupational History  . Not on file  Social Needs  . Financial resource strain: Not on file  . Food insecurity    Worry: Not on file    Inability: Not on file  . Transportation needs    Medical: Not on file    Non-medical: Not on file  Tobacco  Use  . Smoking status: Current Every Day Smoker    Packs/day: 2.00    Types: Cigarettes  . Smokeless tobacco: Never Used  Substance and Sexual Activity  . Alcohol use: Not Currently  . Drug use: Yes    Types: Marijuana    Comment: "have changed to hemp now" 09/17/17  . Sexual activity: Not on file  Lifestyle  . Physical activity    Days per week: Not on file    Minutes per session: Not on file  . Stress: Not on file  Relationships  . Social Herbalist on phone: Not on file    Gets together: Not on file    Attends religious service: Not on file    Active member of club or organization: Not on file    Attends meetings of clubs or organizations: Not on file    Relationship status: Not on file  Other Topics Concern  . Not on file  Social History Narrative  . Not on file   Family History  Problem Relation Age of Onset  . Diabetes Mother   . Cancer Mother        unsure what type  . Heart disease Mother   . Hypertension Mother   . Dementia Father   . Diabetes Brother   . Dementia Maternal Grandfather   . Cancer Paternal Grandmother 24       breast  . Mental illness Neg Hx    Past Surgical History:  Procedure Laterality Date  . APPENDECTOMY  1970      Vanessa Kick, MD 10/27/18 1108

## 2018-11-02 ENCOUNTER — Other Ambulatory Visit: Payer: Self-pay

## 2018-11-02 ENCOUNTER — Ambulatory Visit: Payer: Medicare Other | Attending: Family Medicine | Admitting: Family Medicine

## 2018-11-02 ENCOUNTER — Encounter: Payer: Self-pay | Admitting: Family Medicine

## 2018-11-02 VITALS — BP 139/88 | HR 64 | Temp 98.4°F | Ht 68.0 in | Wt 240.2 lb

## 2018-11-02 DIAGNOSIS — M5442 Lumbago with sciatica, left side: Secondary | ICD-10-CM | POA: Diagnosis not present

## 2018-11-02 DIAGNOSIS — Z809 Family history of malignant neoplasm, unspecified: Secondary | ICD-10-CM | POA: Insufficient documentation

## 2018-11-02 DIAGNOSIS — J9801 Acute bronchospasm: Secondary | ICD-10-CM | POA: Diagnosis not present

## 2018-11-02 DIAGNOSIS — F1221 Cannabis dependence, in remission: Secondary | ICD-10-CM | POA: Diagnosis not present

## 2018-11-02 DIAGNOSIS — G8929 Other chronic pain: Secondary | ICD-10-CM

## 2018-11-02 DIAGNOSIS — G479 Sleep disorder, unspecified: Secondary | ICD-10-CM | POA: Diagnosis not present

## 2018-11-02 DIAGNOSIS — Z888 Allergy status to other drugs, medicaments and biological substances status: Secondary | ICD-10-CM | POA: Insufficient documentation

## 2018-11-02 DIAGNOSIS — J449 Chronic obstructive pulmonary disease, unspecified: Secondary | ICD-10-CM | POA: Diagnosis not present

## 2018-11-02 DIAGNOSIS — Z791 Long term (current) use of non-steroidal anti-inflammatories (NSAID): Secondary | ICD-10-CM | POA: Diagnosis not present

## 2018-11-02 DIAGNOSIS — Z792 Long term (current) use of antibiotics: Secondary | ICD-10-CM | POA: Insufficient documentation

## 2018-11-02 DIAGNOSIS — I10 Essential (primary) hypertension: Secondary | ICD-10-CM | POA: Diagnosis not present

## 2018-11-02 DIAGNOSIS — Z720 Tobacco use: Secondary | ICD-10-CM

## 2018-11-02 DIAGNOSIS — Z7951 Long term (current) use of inhaled steroids: Secondary | ICD-10-CM | POA: Insufficient documentation

## 2018-11-02 DIAGNOSIS — Z8249 Family history of ischemic heart disease and other diseases of the circulatory system: Secondary | ICD-10-CM | POA: Diagnosis not present

## 2018-11-02 DIAGNOSIS — F411 Generalized anxiety disorder: Secondary | ICD-10-CM | POA: Diagnosis not present

## 2018-11-02 DIAGNOSIS — Z79899 Other long term (current) drug therapy: Secondary | ICD-10-CM | POA: Diagnosis not present

## 2018-11-02 DIAGNOSIS — Z803 Family history of malignant neoplasm of breast: Secondary | ICD-10-CM | POA: Insufficient documentation

## 2018-11-02 DIAGNOSIS — E1351 Other specified diabetes mellitus with diabetic peripheral angiopathy without gangrene: Secondary | ICD-10-CM | POA: Diagnosis not present

## 2018-11-02 DIAGNOSIS — Z794 Long term (current) use of insulin: Secondary | ICD-10-CM | POA: Insufficient documentation

## 2018-11-02 DIAGNOSIS — E1151 Type 2 diabetes mellitus with diabetic peripheral angiopathy without gangrene: Secondary | ICD-10-CM | POA: Insufficient documentation

## 2018-11-02 DIAGNOSIS — F259 Schizoaffective disorder, unspecified: Secondary | ICD-10-CM | POA: Insufficient documentation

## 2018-11-02 DIAGNOSIS — E1165 Type 2 diabetes mellitus with hyperglycemia: Secondary | ICD-10-CM | POA: Diagnosis present

## 2018-11-02 DIAGNOSIS — F319 Bipolar disorder, unspecified: Secondary | ICD-10-CM | POA: Insufficient documentation

## 2018-11-02 DIAGNOSIS — F17210 Nicotine dependence, cigarettes, uncomplicated: Secondary | ICD-10-CM | POA: Diagnosis not present

## 2018-11-02 DIAGNOSIS — Z833 Family history of diabetes mellitus: Secondary | ICD-10-CM | POA: Diagnosis not present

## 2018-11-02 LAB — POCT GLYCOSYLATED HEMOGLOBIN (HGB A1C): Hemoglobin A1C: 7.6 % — AB (ref 4.0–5.6)

## 2018-11-02 LAB — GLUCOSE, POCT (MANUAL RESULT ENTRY): POC Glucose: 135 mg/dL — AB (ref 70–99)

## 2018-11-02 MED ORDER — METHOCARBAMOL 500 MG PO TABS: 500.0000 mg | ORAL_TABLET | Freq: Three times a day (TID) | ORAL | 5 refills | Status: DC | PRN

## 2018-11-02 MED ORDER — TRUEPLUS PEN NEEDLES 32G X 4 MM MISC: 11 refills | Status: DC

## 2018-11-02 MED ORDER — ACCU-CHEK GUIDE VI STRP: ORAL_STRIP | 12 refills | Status: DC

## 2018-11-02 MED ORDER — GABAPENTIN 300 MG PO CAPS: ORAL_CAPSULE | ORAL | 6 refills | Status: DC

## 2018-11-02 MED ORDER — GABAPENTIN 300 MG PO CAPS: ORAL_CAPSULE | ORAL | 5 refills | Status: DC

## 2018-11-02 MED ORDER — DICLOFENAC POTASSIUM 50 MG PO TABS: 50.0000 mg | ORAL_TABLET | Freq: Two times a day (BID) | ORAL | 5 refills | Status: DC

## 2018-11-02 MED ORDER — DICLOFENAC POTASSIUM 50 MG PO TABS: ORAL_TABLET | ORAL | 4 refills | Status: DC

## 2018-11-02 MED ORDER — VENTOLIN HFA 108 (90 BASE) MCG/ACT IN AERS: 2.0000 | INHALATION_SPRAY | RESPIRATORY_TRACT | 11 refills | Status: DC | PRN

## 2018-11-02 NOTE — Progress Notes (Signed)
Med refills , htn , gm   CBG today was 135 and a1c was 7.6

## 2018-11-02 NOTE — Progress Notes (Signed)
Established Patient Office Visit  Subjective:  Patient ID: Joshua [name], male    DOB: [birth year]  Age: 63 y.o. MRN: [id number]  CC: No chief complaint on file.   HPI Joshua [name] presents for follow-up of chronic medical issues.  Patient is status post emergency department visit 10/27/2018 secondary to a dog bite to the left forearm and patient had elevated blood pressure of 170/107 at his emergency department visit.  Patient was encouraged to follow-up with his PCP regarding his elevated blood pressure after his emergency department visit.  Patient was treated with Augmentin 875 for the dog bite and prescription for tramadol to take as needed for pain.      Patient reports that on the night that his dog, which is a [place] Beau terrier, bit him, he believes the dog was actually trying to wake him up.  Patient states that around 3:00 am in the morning he will develop increased shortness of breath and difficulty breathing which interferes with his sleep.  He states that he then feels fatigued all day and often naps during the day because of his difficulty sleeping at night.  He reports that when he is walking, he often has to stop to rest secondary to increased back as well as leg pain.  He does experience cramping in his calves if he is walking on an incline.  He reports that the dog bite area is healing after he was placed on antibiotics.  Patient states that on the day did he went to urgent care, his left arm at the area of the dog bite was red, swollen and there was pus leaking from the area.  He denies any issues with diarrhea or abdominal pain after the use of Augmentin for treatment of his arm infection after dog bite.      Patient reports that he did have difficulty tolerating metformin which caused stomach upset and diarrhea and he has stopped the use of this medication.  He states that he did discuss this with the clinical pharmacist who said that he would leave the decision regarding  metformin use for the patient to discuss with the primary care.  Patient reports that his blood sugars are now in the low 100s, less than 120 in the mornings before he has anything to eat or drink.  He reports no significant hypoglycemic episodes.  He has been out of test strips for about 1-1/2 weeks because he states that the pharmacist fill the test strips initially but then would no longer fill them as they stated that they needed a certain form in order to re-feel his test strips.       He reports continued, chronic issues with low back pain which radiates down both legs causing pain and numbness.  Patient feels that his health has declined in general over the past year as he now stays constantly fatigued and has to stop walking secondary to pain in his back and legs.  Patient does continue to smoke cigarettes.  He would like to have a refill of gabapentin at today's visit as he feels that this is helped.  He also would like to know if there are other medications that can be prescribed to help with his back pain.  He states that he saw a specialist at one point he showed him the x-rays of his spine and told him that there was really nothing more that could be done to help with his chronic pain as he has had  longstanding issues with loss of cushioning between the disc.  Patient states that the pain ranges from about a 6 with medication to greater than a 10 at times.  He would also like a refill of diclofenac which he feels works better than the ibuprofen he was taking in the past.  He denies any unusual bruising or bleeding with the use of this medication and no abdominal pain or nausea.  He has seen no blood in the stool and no black stools.       He would also like a refill of albuterol inhaler for treatment of COPD.  He reports that he feels that he has been more shortness of breath recently especially with the hot weather.  He denies any productive cough or increased chest congestion, no sensation of  wheezing at this time.        Past Medical History:  Diagnosis Date  . Back pain   . Bipolar disorder ([place])   . Cannabis use disorder, moderate, dependence ([place])   . Chronic pain   . Diabetes mellitus without complication ([place])   . Generalized anxiety disorder   . Headache   . Hypertension   . Schizoaffective disorder [facility])     Past Surgical History:  Procedure Laterality Date  . APPENDECTOMY  1970    Family History  Problem Relation Age of Onset  . Diabetes Mother   . Cancer Mother        unsure what type  . Heart disease Mother   . Hypertension Mother   . Dementia Father   . Diabetes Brother   . Dementia Maternal Grandfather   . Cancer Paternal Grandmother 30       breast  . Mental illness Neg Hx     Social History   Tobacco Use  . Smoking status: Current Every Day Smoker    Packs/day: 2.00    Types: Cigarettes  . Smokeless tobacco: Never Used  Substance Use Topics  . Alcohol use: Not Currently  . Drug use: Yes    Types: Marijuana    Comment: "have changed to hemp now" 09/17/17    Outpatient Medications Prior to Visit  Medication Sig Dispense Refill  . albuterol (PROVENTIL) (2.5 MG/3ML) 0.083% nebulizer solution Take 3 mLs (2.5 mg total) by nebulization every 6 (six) hours as needed for wheezing or shortness of breath. 75 mL 2  . amoxicillin-clavulanate (AUGMENTIN) 875-125 MG tablet Take 1 tablet by mouth every 12 (twelve) hours. 20 tablet 0  . atorvastatin (LIPITOR) 40 MG tablet Take 1 tablet (40 mg total) by mouth daily. 90 tablet 0  . benztropine (COGENTIN) 0.5 MG tablet Take 1 tablet (0.5 mg total) by mouth 2 (two) times daily. 60 tablet 2  . Blood Glucose Monitoring Suppl (TRUE METRIX METER) w/Device KIT Check blood sugars fasting and at bedtime 1 kit 0  . budesonide-formoterol (SYMBICORT) 160-4.5 MCG/ACT inhaler Inhale 2 puffs into the lungs 2 (two) times daily. 1 Inhaler 3  . diclofenac (CATAFLAM) 50 MG tablet Take 1 tablet (50 mg total) by mouth 2  (two) times daily. After a meal to treat back pain.  Stop ibuprofen 60 tablet 5  . divalproex (DEPAKOTE ER) 500 MG 24 hr tablet Take 3 tablets (1,500 mg total) by mouth at bedtime. 90 tablet 11  . Dulaglutide (TRULICITY) 2.35 TD/3.2KG SOPN Inject 0.75 mg into the skin once a week. 4 pen 2  . gabapentin (NEURONTIN) 300 MG capsule Take 1 capsule (300 mg total) by mouth 3 (  three) times daily. 90 capsule 2  . glucose blood test strip Use QID or as needed to check glucose 100 each 1  . Insulin Glargine (BASAGLAR KWIKPEN) 100 UNIT/ML SOPN Inject 0.3 mLs (30 Units total) into the skin daily. 15 mL 2  . Insulin Pen Needle (TRUEPLUS PEN NEEDLES) 32G X 4 MM MISC Use as directed to inject insulin 100 each 11  . ipratropium (ATROVENT) 0.02 % nebulizer solution Take 2.5 mLs (0.5 mg total) by nebulization 4 (four) times daily. With or instead of the Albuterol 75 mL 0  . losartan (COZAAR) 100 MG tablet Take 1 tablet (100 mg total) by mouth daily. To lower blood pressure; stop lisinopril 30 tablet 3  . metFORMIN (GLUCOPHAGE) 1000 MG tablet Take 1 tablet (1,000 mg total) by mouth 2 (two) times daily with a meal. 180 tablet 0  . nicotine (NICODERM CQ - DOSED IN MG/24 HOURS) 21 mg/24hr patch Place 1 patch (21 mg total) onto the skin daily. (Patient not taking: Reported on 08/28/2018) 28 patch 0  . paliperidone (INVEGA SUSTENNA) 156 MG/ML SUSY injection Inject 1 mL (156 mg total) into the muscle once for 1 dose. Due 5/1 1 mL 11  . prazosin (MINIPRESS) 2 MG capsule Take 1 capsule (2 mg total) by mouth at bedtime. 30 capsule 2  . propranolol (INDERAL) 20 MG tablet Take 1 tablet (20 mg total) by mouth 2 (two) times daily. 60 tablet 0  . risperiDONE (RISPERDAL) 3 MG tablet Take 1 tablet (3 mg total) by mouth at bedtime for 7 days. 7 tablet 0  . traMADol (ULTRAM) 50 MG tablet Take 1 tablet (50 mg total) by mouth every 6 (six) hours as needed. 15 tablet 0  . TRUEplus Lancets 28G MISC 1 each by Does not apply route 2 (two) times  daily. 100 each 0  . VENTOLIN HFA 108 (90 Base) MCG/ACT inhaler Inhale 2 puffs into the lungs every 4 (four) hours as needed. (Patient taking differently: Inhale 2 puffs into the lungs every 4 (four) hours as needed for wheezing or shortness of breath. ) 1 Inhaler 0   No facility-administered medications prior to visit.     Allergies  Allergen Reactions  . Imitrex [Sumatriptan] Other (See Comments)    mania    ROS Review of Systems  Constitutional: Positive for fatigue. Negative for chills and fever.  HENT: Negative for nosebleeds, sore throat and trouble swallowing.   Eyes: Negative for photophobia and visual disturbance.  Respiratory: Positive for shortness of breath (with exertion). Negative for cough.   Gastrointestinal: Negative for abdominal pain, constipation, diarrhea and nausea.  Endocrine: Negative for cold intolerance, heat intolerance, polydipsia, polyphagia and polyuria.  Genitourinary: Negative for dysuria and flank pain.  Musculoskeletal: Positive for back pain and gait problem.  Skin: Positive for wound (healing wound from dog bite-left forearm). Negative for rash.  Neurological: Negative for dizziness and headaches.  Hematological: Negative for adenopathy. Does not bruise/bleed easily.  Psychiatric/Behavioral: Positive for sleep disturbance (stays sleepy throughout the day; feels SOB when asleep). Negative for self-injury and suicidal ideas.      Objective:    Physical Exam  Constitutional: He is oriented to person, place, and time. He appears well-developed and well-nourished.  Neck: Normal range of motion. Neck supple. No JVD present. No thyromegaly present.  Cardiovascular: Normal rate and regular rhythm.  No murmur heard. Pulses:      Dorsalis pedis pulses are 1+ on the right side and 0 on the left side.  Posterior tibial pulses are 1+ on the right side and 0 on the left side.  No carotid bruits appreciable on exam ;I was not able to appreciate a  palpable pulse on the left dorsalis pedis or posterior tibial areas of the foot.  No evidence of cyanosis.  Patient had 2 small raised papules of the skin on the dorsum of the foot just below the left little toe that did not appear to represent bulla or ulcerations but rather possible warts  Pulmonary/Chest: Effort normal and breath sounds normal.  Patient with very fine coarse breath sounds in all posterior lung fields but no increased work of breathing, no accessory muscle use.  Mild decrease breath sounds  Abdominal: Soft. He exhibits no distension. There is no abdominal tenderness. There is no rebound and no guarding.  Musculoskeletal:        General: Tenderness (Lumbosacral area) present. No edema.  Lymphadenopathy:    He has no cervical adenopathy.  Neurological: He is alert and oriented to person, place, and time.  Skin: Skin is warm and dry.  Patient with 2 healing scabbed wounds on the left medial forearm  Psychiatric: He has a normal mood and affect. His behavior is normal.  Nursing note and vitals reviewed.   BP 139/88 (BP Location: Left Arm, Patient Position: Sitting, Cuff Size: Large)   Pulse 64   Temp 98.4 F (36.9 C) (Oral)   Ht _0  (1.727 m)   Wt 240 lb 3.2 oz (109 kg)   SpO2 96%   BMI 36.52 kg/m  Wt Readings from Last 3 Encounters:  09/07/18 230 lb 6.1 oz (104.5 kg)  08/28/18 230 lb 9.6 oz (104.6 kg)  07/24/18 220 lb 7.4 oz (100 kg)     Health Maintenance Due  Topic Date Due  . Hepatitis C Screening  11/13/1955  . PNEUMOCOCCAL POLYSACCHARIDE VACCINE AGE 46-64 HIGH RISK  02/23/1960  . FOOT EXAM  02/23/1968  . OPHTHALMOLOGY EXAM  02/23/1968  . HIV Screening  02/22/1973  . COLONOSCOPY  02/23/2008    There are no preventive care reminders to display for this patient.  Lab Results  Component Value Date   TSH 1.140 08/28/2018   Lab Results  Component Value Date   WBC 7.9 08/28/2018   HGB 14.6 08/28/2018   HCT 44.0 08/28/2018   MCV 97 08/28/2018   PLT  115 (L) 08/28/2018   Lab Results  Component Value Date   NA 135 08/28/2018   K 4.9 08/28/2018   CO2 20 08/28/2018   GLUCOSE 262 (H) 08/28/2018   BUN 24 08/28/2018   CREATININE 1.08 08/28/2018   BILITOT 0.2 08/28/2018   ALKPHOS 95 08/28/2018   AST 11 08/28/2018   ALT 7 08/28/2018   PROT 5.7 (L) 08/28/2018   ALBUMIN 3.7 (L) 08/28/2018   CALCIUM 8.9 08/28/2018   ANIONGAP 10 07/26/2018   Lab Results  Component Value Date   CHOL 156 08/28/2018   Lab Results  Component Value Date   HDL 37 (L) 08/28/2018   Lab Results  Component Value Date   LDLCALC 63 08/28/2018   Lab Results  Component Value Date   TRIG 280 (H) 08/28/2018   Lab Results  Component Value Date   CHOLHDL 4.2 08/28/2018   Lab Results  Component Value Date   HGBA1C 11.6 (H) 07/12/2018      Assessment & Plan:  1. Type 2 diabetes mellitus with hyperglycemia, unspecified whether long term insulin use ([place]) Patient had hemoglobin A1c done  at today's visit which showed a great improvement in his control of diabetes.  Patient's hemoglobin A1c at today's visit was 7.6 as compared to A1c of 11.6 three months ago.  He is to continue his current medication regimen along with a healthy, low carbohydrate diet and regular exercise as tolerated such as walking.  Continue to monitor blood sugars.  New prescription printed and given to patient to take to his pharmacy regarding refill of strips and patient's diagnosis and most recent hemoglobin A1c results as well as last hemoglobin A1c result was placed on the prescription as patient reports that he has had difficulty obtaining refills of his strips. - Glucose (CBG) - HgB A1c  - Insulin Pen Needle (TRUEPLUS PEN NEEDLES) 32G X 4 MM MISC; Use as directed to inject insulin  Dispense: 100 each; Refill: 11 - Comprehensive metabolic panel - CBC with Differential - glucose blood (ACCU-CHEK GUIDE) test strip; Use as instructed to check blood sugar up to 3 x daily  Dispense: 100  each; Refill: 12  2. Essential hypertension Patient's initial blood pressure was elevated but on recheck, blood pressure was 139/88.  Discussed goal blood pressure of 130/80.  Continue low-sodium/DASH diet along with regular cardiovascular exercise as tolerated.  Continue losartan and propranolol  3. Chronic low back pain with left-sided sciatica, unspecified back pain laterality Patient provided with refill of diclofenac per patient's request.  He is also aware that he should not take ibuprofen or other nonsteroidal anti-inflammatories while taking diclofenac.  Prescription also provided for Robaxin 500 mg to take 1 every 8 hours as needed for muscle spasms.  Gabapentin also refilled.  CMP in follow-up of long-term use of medication - methocarbamol (ROBAXIN) 500 MG tablet; Take 1 tablet (500 mg total) by mouth every 8 (eight) hours as needed for muscle spasms.  Dispense: 90 tablet; Refill: 5 - diclofenac (CATAFLAM) 50 MG tablet; One pill twice daily as needed for pain; take after eating  Dispense: 60 tablet; Refill: 4 - Comprehensive metabolic panel - CBC with Differential - gabapentin (NEURONTIN) 300 MG capsule; Take one pill twice per day and two at bedtime  Dispense: 120 capsule; Refill: 5  4. Peripheral vascular disease due to secondary diabetes [facility]); tobacco use Patient with symptoms suggestive of claudication especially in the left lower extremity.  Patient does have risk factors including long-term use of cigarettes, diabetes and hypertriglyceridemia.  He has been referred to vascular surgery for further evaluation and treatment.  Discussed the need for complete smoking cessation which patient will consider.  He is not ready to stop smoking but will try to decrease with eventual goal of stopping. - Ambulatory referral to Vascular Surgery  5. Sleep disturbance Patient with complaint of daytime fatigue as well as issues with sleep and believes he may have sleep apnea.  Patient however if  possible wishes to have the sleep test done at home as he does not believe that he would be able to fall sleep and remain asleep in a sleep facility. - Home sleep test  6.  Chronic obstructive pulmonary disease; 7.  Bronchospasm Refill provided for Ventolin HFA and patient is to continue the use of Symbicort.  Complete smoking cessation advised and discussed - VENTOLIN HFA 108 (90 Base) MCG/ACT inhaler; Inhale 2 puffs into the lungs every 4 (four) hours as needed.  Dispense: 18 g; Refill: 11  8. Encounter for long-term current use of high risk medication Patient will have comprehensive metabolic panel in follow-up of long-term use of  high-risk medications as he is on medications for mental health issues as well as for treatment of diabetes, hypertension, lumbar radiculopathy and statin for hyperlipidemia - Comprehensive metabolic panel  9. Encounter for long-term (current) use of NSAIDs CBC in follow-up of long-term use of nonsteroidal anti-inflammatories for treatment of chronic low back pain - CBC with Differential  An After Visit Summary was printed and given to the patient.  Follow-up: Return in about 3 months (around 02/02/2019), or if symptoms worsen or fail to improve, for DM/HTN.   Antony Blackbird, MD

## 2018-11-03 ENCOUNTER — Telehealth: Payer: Self-pay | Admitting: Licensed Clinical Social Worker

## 2018-11-03 ENCOUNTER — Other Ambulatory Visit: Payer: Self-pay

## 2018-11-03 LAB — COMPREHENSIVE METABOLIC PANEL WITH GFR
ALT: 11 IU/L (ref 0–44)
AST: 13 IU/L (ref 0–40)
Albumin/Globulin Ratio: 2 (ref 1.2–2.2)
Albumin: 4.2 g/dL (ref 3.8–4.9)
Alkaline Phosphatase: 74 IU/L (ref 39–117)
BUN/Creatinine Ratio: 21 (ref 10–24)
BUN: 25 mg/dL (ref 8–27)
Bilirubin Total: 0.2 mg/dL (ref 0.0–1.2)
CO2: 20 mmol/L (ref 20–29)
Calcium: 9 mg/dL (ref 8.6–10.2)
Chloride: 103 mmol/L (ref 96–106)
Creatinine, Ser: 1.18 mg/dL (ref 0.76–1.27)
GFR calc Af Amer: 77 mL/min/1.73
GFR calc non Af Amer: 67 mL/min/1.73
Globulin, Total: 2.1 g/dL (ref 1.5–4.5)
Glucose: 114 mg/dL — ABNORMAL HIGH (ref 65–99)
Potassium: 5.4 mmol/L — ABNORMAL HIGH (ref 3.5–5.2)
Sodium: 137 mmol/L (ref 134–144)
Total Protein: 6.3 g/dL (ref 6.0–8.5)

## 2018-11-03 LAB — CBC WITH DIFFERENTIAL/PLATELET
Basophils Absolute: 0.1 x10E3/uL (ref 0.0–0.2)
Basos: 1 %
EOS (ABSOLUTE): 0.3 x10E3/uL (ref 0.0–0.4)
Eos: 4 %
Hematocrit: 46.8 % (ref 37.5–51.0)
Hemoglobin: 15.8 g/dL (ref 13.0–17.7)
Immature Grans (Abs): 0 x10E3/uL (ref 0.0–0.1)
Immature Granulocytes: 0 %
Lymphocytes Absolute: 2.1 x10E3/uL (ref 0.7–3.1)
Lymphs: 29 %
MCH: 32.4 pg (ref 26.6–33.0)
MCHC: 33.8 g/dL (ref 31.5–35.7)
MCV: 96 fL (ref 79–97)
Monocytes Absolute: 0.7 x10E3/uL (ref 0.1–0.9)
Monocytes: 10 %
Neutrophils Absolute: 4.2 x10E3/uL (ref 1.4–7.0)
Neutrophils: 56 %
Platelets: 140 x10E3/uL — ABNORMAL LOW (ref 150–450)
RBC: 4.88 x10E6/uL (ref 4.14–5.80)
RDW: 13.8 % (ref 11.6–15.4)
WBC: 7.3 x10E3/uL (ref 3.4–10.8)

## 2018-11-03 NOTE — Telephone Encounter (Signed)
Call placed to patient to address behavioral health and/or resource needs. LCSW left message for a return call.

## 2018-11-04 ENCOUNTER — Other Ambulatory Visit: Payer: Self-pay | Admitting: Family Medicine

## 2018-11-04 DIAGNOSIS — E875 Hyperkalemia: Secondary | ICD-10-CM

## 2018-11-04 NOTE — Progress Notes (Signed)
Patient ID: Joshua [name], male   DOB: 05-Jul-1955, 63 y.o.   MRN: [id number]   Mild hyperkalemia of 5.4 on recent blood work and he will be contacted to repeat his potassium level- see lab results

## 2018-11-06 ENCOUNTER — Telehealth: Payer: Self-pay | Admitting: *Deleted

## 2018-11-06 ENCOUNTER — Telehealth: Payer: Self-pay | Admitting: Licensed Clinical Social Worker

## 2018-11-06 NOTE — Telephone Encounter (Signed)
Call placed to patient to follow up on consult to address behavioral health and/or resource needs by PCP. LCSW introduced self and explained role at [facility].   Pt shared that he receives behavioral health services through [facility]. He has a therapist and participates in medication management with a Nurse Practitioner. States that medication management has been effective for him.   Pt reports concerns regarding ability to manage physical health. States that he has been without test strips for a week and was unable to obtain them from pharmacy yesterday. LCSW consulted with Feliz Beam to follow up with patient regarding inability to obtain test strips. Pt was appreciative for the assistance.   LCSW inquired about pt's support system. Pt reports son assists him with his dogs, there are approximately twenty dogs kept on pt's property. Strategies to ensure safety when caring for the animals were discussed.   Pt requests nurse contacts him to educate him why PCP suggested stints. He is confused if they are needed in his legs or his heart. LCSW sent message to clinical staff to follow up.   No additional concerns noted.

## 2018-11-06 NOTE — Telephone Encounter (Signed)
Patient is being referred to vascular surgery to evaluate the blood flow in his legs due to the pain he is having in his left leg and foot. He needs to be evaluated to see if he needs any further treatment to help with the blood flow. If Imitirex is listed as an allergy it probably will not be prescribed. He was given a written prescription to take to [facility] to have his strips refilled. I need to know what type of meter he is using and if he knows the name of the test strips he is using

## 2018-11-06 NOTE — Telephone Encounter (Signed)
Patient called stating he would like to know why he is needing a stent. Per pt he wants to know if the stent was for his heart or for his leg. Per pt that doctor told him he was being referred to the Cardiologist. Staff informed him that if the provider was referring him to the Cardiologist, then the stent was probably for his his heart. Patient then asked staff if there was something wrong with his heart. Staff informed patient that she is not sure what he and provider discussed in the room but message will be sent back to the provider. Patient then asked if provider could please send his test strips to [place]. Per pt he is out. Per pt he would like to know if provider can also fill his Imitrex. Staff informed patient that this medication is not on his current list. Per pt he do not use it that often. Patient then stated that he noticed that the Imitrex was on his Allergy list as well. Staff informed patient that message will be send to provider.

## 2018-11-08 NOTE — Telephone Encounter (Signed)
Informed patient with what provider stated and he verbalized understanding.   Per patient he is having back pain. Per pt he thinks it's his kidneys. When staff asked which side was hurting him he said it was both side.

## 2018-11-08 NOTE — Telephone Encounter (Signed)
Please offer office visit or have patient come in for UA

## 2018-11-13 ENCOUNTER — Other Ambulatory Visit: Payer: Self-pay

## 2018-11-13 ENCOUNTER — Telehealth: Payer: Self-pay | Admitting: Family Medicine

## 2018-11-13 ENCOUNTER — Ambulatory Visit: Payer: Medicare Other | Attending: Family Medicine

## 2018-11-13 DIAGNOSIS — E875 Hyperkalemia: Secondary | ICD-10-CM | POA: Diagnosis not present

## 2018-11-13 NOTE — Telephone Encounter (Signed)
Patient came in wanting to speak with someone in regards to paperwork for test strips for his insurance company that was send to the [employer]. Patient states he was told that they did not receive any paperwork. Please follow up.

## 2018-11-13 NOTE — Telephone Encounter (Signed)
Pharmacy called stating that they need clear instructions on glucose blood (ACCU-CHEK GUIDE) test strip [[id number]], and TRUEplus Lancets 28G MISC [[id number]]..please follow up

## 2018-11-14 LAB — POTASSIUM: Potassium: 4.7 mmol/L (ref 3.5–5.2)

## 2018-11-14 NOTE — Telephone Encounter (Signed)
I believe Joycelyn Schmid faxed  information to the patient's pharmacy regarding his test strips.

## 2018-11-15 NOTE — Telephone Encounter (Signed)
Medical Assistant left message on patient's home and cell voicemail. Voicemail states to give a call back to [place] with [facility] at [phone]. Patient is aware of potassium being normal and DM supplies has refills on file at [facility]

## 2018-11-15 NOTE — Telephone Encounter (Signed)
-----   Message from Antony Blackbird, MD sent at 11/14/2018  1:18 PM EDT ----- Please let patient know that his potassium level is now normal

## 2018-11-16 NOTE — Telephone Encounter (Signed)
Per Feliz Beam, PT,- Please ask patient who supplies his Dexcom continuous glucose monitoring device-  The company would need to supply the meter. If patient states he does not have a Dexcom device, please inform Feliz Beam.

## 2018-11-17 ENCOUNTER — Telehealth: Payer: Self-pay | Admitting: Family Medicine

## 2018-11-17 DIAGNOSIS — E1165 Type 2 diabetes mellitus with hyperglycemia: Secondary | ICD-10-CM

## 2018-11-17 MED ORDER — ACCU-CHEK GUIDE VI STRP: ORAL_STRIP | 12 refills | Status: DC

## 2018-11-17 NOTE — Addendum Note (Signed)
Addended by: Frederich Cha on: 11/17/2018 11:01 AM   Modules accepted: Orders

## 2018-11-17 NOTE — Telephone Encounter (Signed)
Joshua [name] spoke to patient today about the Dexcom and insurance.

## 2018-11-17 NOTE — Telephone Encounter (Signed)
Pt has had trouble getting his test strips, its been about a month since pt last had this. Pharmacy states that CMN form isn't being filled out right..please follow up

## 2018-11-20 DIAGNOSIS — F250 Schizoaffective disorder, bipolar type: Secondary | ICD-10-CM | POA: Diagnosis not present

## 2018-11-23 ENCOUNTER — Emergency Department (HOSPITAL_COMMUNITY)
Admission: EM | Admit: 2018-11-23 | Discharge: 2018-11-23 | Disposition: A | Discharge status: Home / Self Care | Payer: Medicare Other | Attending: Emergency Medicine | Admitting: Emergency Medicine

## 2018-11-23 ENCOUNTER — Ambulatory Visit (HOSPITAL_COMMUNITY)
Admission: EM | Admit: 2018-11-23 | Discharge: 2018-11-23 | Discharge status: Against Medical Advice | Payer: Medicare Other | Source: Home / Self Care

## 2018-11-23 ENCOUNTER — Encounter (HOSPITAL_COMMUNITY): Payer: Self-pay

## 2018-11-23 ENCOUNTER — Other Ambulatory Visit: Payer: Self-pay

## 2018-11-23 ENCOUNTER — Emergency Department (HOSPITAL_COMMUNITY): Payer: Medicare Other

## 2018-11-23 DIAGNOSIS — R079 Chest pain, unspecified: Secondary | ICD-10-CM | POA: Diagnosis not present

## 2018-11-23 DIAGNOSIS — R531 Weakness: Secondary | ICD-10-CM | POA: Insufficient documentation

## 2018-11-23 DIAGNOSIS — M79602 Pain in left arm: Secondary | ICD-10-CM | POA: Diagnosis not present

## 2018-11-23 DIAGNOSIS — Z5321 Procedure and treatment not carried out due to patient leaving prior to being seen by health care provider: Secondary | ICD-10-CM | POA: Diagnosis not present

## 2018-11-23 LAB — BASIC METABOLIC PANEL
Anion gap: 12 (ref 5–15)
BUN: 26 mg/dL — ABNORMAL HIGH (ref 6–20)
CO2: 19 mmol/L — ABNORMAL LOW (ref 22–32)
Calcium: 9.3 mg/dL (ref 8.9–10.3)
Chloride: 103 mmol/L (ref 98–111)
Creatinine, Ser: 1.26 mg/dL — ABNORMAL HIGH (ref 0.61–1.24)
GFR calc Af Amer: >60 mL/min (ref >60)
GFR calc non Af Amer: >60 mL/min (ref >60)
Glucose, Bld: 131 mg/dL — ABNORMAL HIGH (ref 70–99)
Potassium: 4.9 mmol/L (ref 3.5–5.1)
Sodium: 134 mmol/L — ABNORMAL LOW (ref 135–145)

## 2018-11-23 LAB — CBC
HCT: 45.7 % (ref 39.0–52.0)
Hemoglobin: 15.7 g/dL (ref 13.0–17.0)
MCH: 33 pg (ref 26.0–34.0)
MCHC: 34.4 g/dL (ref 30.0–36.0)
MCV: 96 fL (ref 80.0–100.0)
Platelets: 206 10*3/uL (ref 150–400)
RBC: 4.76 MIL/uL (ref 4.22–5.81)
RDW: 13.6 % (ref 11.5–15.5)
WBC: 10.8 10*3/uL — ABNORMAL HIGH (ref 4.0–10.5)
nRBC: 0 % (ref 0.0–0.2)

## 2018-11-23 LAB — TROPONIN I (HIGH SENSITIVITY): Troponin I (High Sensitivity): 4 ng/L (ref <18)

## 2018-11-23 MED ORDER — SODIUM CHLORIDE 0.9% FLUSH: 3.0000 mL | Freq: Once | INTRAVENOUS | Status: DC

## 2018-11-23 NOTE — ED Notes (Signed)
Pt stated he was going home and was seen walking across the parking lot towards parked cars

## 2018-11-23 NOTE — ED Notes (Signed)
Spoke with pt in lobby.  Pt having left sided chest pain since this morning, that has been radiating into his left shoulder, and down to his hand.  Pt also had an episode of diaphoresis in [employer] that occurred with the pain.  Pt reported difficulty with breathing, but could be due to the mask.  Pt elected to go to the ED instead of here.

## 2018-11-23 NOTE — ED Notes (Signed)
Pt is leaving AMA. Pt stated he isn't willing to wait 8-9 hours.

## 2018-11-23 NOTE — ED Triage Notes (Signed)
Pt reports chest pain that started around 7am , pain then radiated to his left shoulder and hand. Pt reports it as a tearing pain in his left hand that shoots up to his arm . Chest pain worse with deep inspiration. Pt a.o, nad noted

## 2018-11-26 ENCOUNTER — Other Ambulatory Visit: Payer: Self-pay | Admitting: Family Medicine

## 2018-11-26 DIAGNOSIS — I10 Essential (primary) hypertension: Secondary | ICD-10-CM

## 2018-11-27 NOTE — Telephone Encounter (Signed)
Do you know if he has received his diabetic supplies

## 2018-11-29 ENCOUNTER — Telehealth: Payer: Self-pay | Admitting: Family Medicine

## 2018-11-29 NOTE — Telephone Encounter (Signed)
Patient came in stating that he needs refills for his test strips. Patient was told by the pharmacy to contact PCP. States that the insurance needs documentation from PCP in order to cover refills, Please follow up.

## 2018-12-04 NOTE — Telephone Encounter (Signed)
The documentation the pharmacy wants has been sent three separate times now. They continue to have issues filling the prescription, I do not have any new documentation from them to complete.

## 2018-12-06 ENCOUNTER — Emergency Department (HOSPITAL_COMMUNITY)
Admission: EM | Admit: 2018-12-06 | Discharge: 2018-12-07 | Disposition: A | Discharge status: Home / Self Care | Payer: Medicare Other | Attending: Emergency Medicine | Admitting: Emergency Medicine

## 2018-12-06 DIAGNOSIS — F1290 Cannabis use, unspecified, uncomplicated: Secondary | ICD-10-CM | POA: Insufficient documentation

## 2018-12-06 DIAGNOSIS — Z046 Encounter for general psychiatric examination, requested by authority: Secondary | ICD-10-CM | POA: Diagnosis present

## 2018-12-06 DIAGNOSIS — Z79899 Other long term (current) drug therapy: Secondary | ICD-10-CM | POA: Insufficient documentation

## 2018-12-06 DIAGNOSIS — F17210 Nicotine dependence, cigarettes, uncomplicated: Secondary | ICD-10-CM | POA: Insufficient documentation

## 2018-12-06 DIAGNOSIS — F319 Bipolar disorder, unspecified: Secondary | ICD-10-CM | POA: Diagnosis not present

## 2018-12-06 DIAGNOSIS — I10 Essential (primary) hypertension: Secondary | ICD-10-CM | POA: Diagnosis not present

## 2018-12-06 DIAGNOSIS — F250 Schizoaffective disorder, bipolar type: Secondary | ICD-10-CM | POA: Diagnosis not present

## 2018-12-06 DIAGNOSIS — R4689 Other symptoms and signs involving appearance and behavior: Secondary | ICD-10-CM | POA: Insufficient documentation

## 2018-12-06 DIAGNOSIS — E119 Type 2 diabetes mellitus without complications: Secondary | ICD-10-CM | POA: Diagnosis not present

## 2018-12-06 LAB — COMPREHENSIVE METABOLIC PANEL
ALT: 15 U/L (ref 0–44)
AST: 17 U/L (ref 15–41)
Albumin: 3.5 g/dL (ref 3.5–5.0)
Alkaline Phosphatase: 80 U/L (ref 38–126)
Anion gap: 10 (ref 5–15)
BUN: 10 mg/dL (ref 6–20)
CO2: 22 mmol/L (ref 22–32)
Calcium: 9 mg/dL (ref 8.9–10.3)
Chloride: 100 mmol/L (ref 98–111)
Creatinine, Ser: 1.39 mg/dL — ABNORMAL HIGH (ref 0.61–1.24)
GFR calc Af Amer: >60 mL/min (ref >60)
GFR calc non Af Amer: 55 mL/min — ABNORMAL LOW (ref >60)
Glucose, Bld: 177 mg/dL — ABNORMAL HIGH (ref 70–99)
Potassium: 5 mmol/L (ref 3.5–5.1)
Sodium: 132 mmol/L — ABNORMAL LOW (ref 135–145)
Total Bilirubin: 0.6 mg/dL (ref 0.3–1.2)
Total Protein: 6.1 g/dL — ABNORMAL LOW (ref 6.5–8.1)

## 2018-12-06 LAB — CBC
HCT: 44.2 % (ref 39.0–52.0)
Hemoglobin: 15.3 g/dL (ref 13.0–17.0)
MCH: 32.8 pg (ref 26.0–34.0)
MCHC: 34.6 g/dL (ref 30.0–36.0)
MCV: 94.6 fL (ref 80.0–100.0)
Platelets: 154 10*3/uL (ref 150–400)
RBC: 4.67 MIL/uL (ref 4.22–5.81)
RDW: 13.1 % (ref 11.5–15.5)
WBC: 6.2 10*3/uL (ref 4.0–10.5)
nRBC: 0 % (ref 0.0–0.2)

## 2018-12-06 LAB — ACETAMINOPHEN LEVEL: Acetaminophen (Tylenol), Serum: <10 ug/mL — ABNORMAL LOW (ref 10–30)

## 2018-12-06 LAB — RAPID URINE DRUG SCREEN, HOSP PERFORMED
Amphetamines: NOT DETECTED
Barbiturates: NOT DETECTED
Benzodiazepines: NOT DETECTED
Cocaine: NOT DETECTED
Opiates: NOT DETECTED
Tetrahydrocannabinol: POSITIVE — AB

## 2018-12-06 LAB — SALICYLATE LEVEL: Salicylate Lvl: <7 mg/dL (ref 2.8–30.0)

## 2018-12-06 LAB — ETHANOL: Alcohol, Ethyl (B): <10 mg/dL (ref <10)

## 2018-12-06 MED ORDER — LORAZEPAM 1 MG PO TABS
1.0000 mg | ORAL_TABLET | Freq: Once | ORAL | Status: DC
  Filled 2018-12-06: qty 1

## 2018-12-06 MED ORDER — TRAMADOL HCL 50 MG PO TABS
50.0000 mg | ORAL_TABLET | Freq: Four times a day (QID) | ORAL | Status: DC | PRN
  Administered 2018-12-06: 50 mg via ORAL
  Filled 2018-12-06: qty 1

## 2018-12-06 NOTE — ED Notes (Signed)
Pt completing TTS

## 2018-12-06 NOTE — BH Assessment (Addendum)
Tele Assessment Note   Patient Name: Joshua [name] MRN: [id number] Referring Physician: Dr. Quintella Reichert Location of Patient: [facility] Location of Provider: [place] is an 63 y.o. male  presenting to the ED under IVC. IVC was completed by the patient's son. Patient reported "I told my daughter I was not leaving the house to her and everyone got mad, then my son took out IVC papers on me, all I did was a little cursing, I didn't hurt or threaten nobody". Per the IVC has not being caring for himself, not eating and has been aggressive towards others. Wife denied any physical aggressive behaviors.  IVC states that he sees people turning to walls and is hyper religious. Patient reported smoking marijuana daily. Patient reported he is ready to go home. Patient reported becoming irritated as he can't wear mask due to breathing condition and long beard. Patient irritated due to being exposed to germs in the hospital and not being at home. Patient reported he is not a threat to himself or anybody else and wants to go home. Patient denied SI, HI and psychosis. Patient mental health inpatient 06/2018. Patient denied prior suicide attempts and self-harming behaviors. Patient currently being seen at [facility] for medication management and outpatient therapy.   Collateral Contact: Abigail Butts, wife, [phone] We don't think he is a threat to self or others. He has been aggressively arguing thinking everybody is out to get him. Patient reported bipolar. Patient consistently takes pill medication. Patient reported that the [place] shot makes him more aggressive. Medications are may not be working. Abigail Butts just gave patient shot 2 days ago. Abigail Butts reported patient is fine when he takes his shot and that he has missed a couple of shots. Abigail Butts denied that patient was SI, HI or physically aggressive. Abigail Butts had no concerns regarding patient being discharged, stating that he would not hurt  himself or anyone else. Abigail Butts reported patient is addicted to smoking marijuana and needs to stop.   Diagnosis: BIpolar  Past Medical History:  Past Medical History:  Diagnosis Date  . Back pain   . Bipolar disorder ([place])   . Cannabis use disorder, moderate, dependence ([place])   . Chronic pain   . Diabetes mellitus without complication ([place])   . Generalized anxiety disorder   . Headache   . Hypertension   . Schizoaffective disorder [facility])     Past Surgical History:  Procedure Laterality Date  . APPENDECTOMY  1970    Family History:  Family History  Problem Relation Age of Onset  . Diabetes Mother   . Cancer Mother        unsure what type  . Heart disease Mother   . Hypertension Mother   . Dementia Father   . Diabetes Brother   . Dementia Maternal Grandfather   . Cancer Paternal Grandmother 12       breast  . Mental illness Neg Hx     Social History:  reports that he has been smoking cigarettes. He has been smoking about 2.00 packs per day. He has never used smokeless tobacco. He reports previous alcohol use. He reports current drug use. Drug: Marijuana.  Additional Social History:  Alcohol / Drug Use Pain Medications: see MAR Prescriptions: see MAR Over the Counter: see MAR  CIWA: CIWA-Ar BP: (!) 157/87 Pulse Rate: 100 COWS:    Allergies:  Allergies  Allergen Reactions  . Imitrex [Sumatriptan] Other (See Comments)    mania  Home Medications: (Not in a hospital admission)   OB/GYN Status:  No LMP for male patient.  General Assessment Data Location of Assessment: [facility] ED TTS Assessment: In system Is this a Tele or Face-to-Face Assessment?: Tele Assessment Is this an Initial Assessment or a Re-assessment for this encounter?: Initial Assessment Patient Accompanied by:: N/A Language Other than English: No Living Arrangements: (family home) What gender do you identify as?: Male Marital status: Married Living Arrangements: Spouse/significant other,  Children Can pt return to current living arrangement?: Yes Admission Status: Involuntary Petitioner: Family member Is patient capable of signing voluntary admission?: (IVC) Referral Source: Self/Family/Friend     Crisis Care Plan Living Arrangements: Spouse/significant other, Children Legal Guardian: (self) Name of Psychiatrist: Consulting civil engineer) Name of Therapist: Consulting civil engineer)  Education Status Is patient currently in school?: No Is the patient employed, unemployed or receiving disability?: Receiving disability income  Risk to self with the past 6 months Suicidal Ideation: No Has patient been a risk to self within the past 6 months prior to admission? : No Suicidal Intent: No Has patient had any suicidal intent within the past 6 months prior to admission? : No Is patient at risk for suicide?: No Suicidal Plan?: No Has patient had any suicidal plan within the past 6 months prior to admission? : No Access to Means: No What has been your use of drugs/alcohol within the last 12 months?: (marijuana) Previous Attempts/Gestures: No How many times?: (0) Other Self Harm Risks: (none reported) Triggers for Past Attempts: (n/la) Intentional Self Injurious Behavior: None Family Suicide History: Yes(uncle) Recent stressful life event(s): (family discord) Persecutory voices/beliefs?: No Depression: Yes Depression Symptoms: Feeling angry/irritable, Fatigue, Isolating, Guilt Substance abuse history and/or treatment for substance abuse?: No Suicide prevention information given to non-admitted patients: Not applicable  Risk to Others within the past 6 months Homicidal Ideation: No Does patient have any lifetime risk of violence toward others beyond the six months prior to admission? : No Thoughts of Harm to Others: No Current Homicidal Intent: No Current Homicidal Plan: No Access to Homicidal Means: No Identified Victim: (none) History of harm to others?: No Assessment of Violence: None  Noted Violent Behavior Description: (denied) Does patient have access to weapons?: No Criminal Charges Pending?: No Does patient have a court date: No Is patient on probation?: No  Psychosis Hallucinations: None noted Delusions: None noted  Mental Status Report Appearance/Hygiene: In scrubs Eye Contact: Good Motor Activity: Freedom of movement Speech: Logical/coherent Level of Consciousness: Alert Mood: Depressed Affect: Depressed Anxiety Level: Minimal Thought Processes: Relevant Judgement: Partial Orientation: Person, Time, Situation, Place Obsessive Compulsive Thoughts/Behaviors: None  Cognitive Functioning Concentration: Normal Memory: Recent Intact Is patient IDD: No Insight: Poor Impulse Control: Poor Appetite: Poor Have you had any weight changes? : No Change Sleep: No Change Total Hours of Sleep: (8-10) Vegetative Symptoms: None  ADLScreening [facility] Assessment Services) Patient's cognitive ability adequate to safely complete daily activities?: Yes Patient able to express need for assistance with ADLs?: Yes Independently performs ADLs?: Yes (appropriate for developmental age)  Prior Inpatient Therapy Prior Inpatient Therapy: Yes Prior Therapy Dates: (06/2018) Prior Therapy Facilty/Provider(s): ([facility]) Reason for Treatment: (unknown)  Prior Outpatient Therapy Prior Outpatient Therapy: Yes Prior Therapy Dates: (present) Prior Therapy Facilty/Provider(s): ([facility] Reason for Treatment: (Unknown) Does patient have an ACCT team?: No Does patient have Intensive In-House Services?  : No Does patient have [facility] services? : Yes Does patient have P4CC services?: No  ADL Screening (condition at time of admission) Patient's cognitive ability adequate  to safely complete daily activities?: Yes Patient able to express need for assistance with ADLs?: Yes Independently performs ADLs?: Yes (appropriate for developmental age)  Disposition:  Disposition Initial  Assessment Completed for this Encounter: Yes  Anette Riedel, NP, patient does not meet inpatient criteria. Patient to follow-up with [facility].  This service was provided via telemedicine using a 2-way, interactive audio and video technology.  Names of all persons participating in this telemedicine service and their role in this encounter. Name: Joshua [name] Role: Patient  Name: Kirtland Bouchard Role: TTS Clinician  Name:  Role:   Name:  Role:     Venora Maples 12/06/2018 9:38 PM
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
## 2018-12-06 NOTE — ED Notes (Signed)
Meal given

## 2018-12-06 NOTE — ED Notes (Signed)
Pt complaining of back pain and headache. Dr. Ralene Bathe notified

## 2018-12-06 NOTE — ED Notes (Signed)
TTS done. Pt refused to have EKG done. Pt stated that "I didn't come up here for that. My heart is fine." Pt moved back into hallway

## 2018-12-06 NOTE — ED Notes (Signed)
Dr. Ralene Bathe at bedside talking to patient and patient's wife. Pt talking to wife at this time.

## 2018-12-07 NOTE — ED Provider Notes (Signed)
Patient has been deemed appropriate for discharge.  Seen by TTS and with shared decision making with family/wife he is appropriate for discharge.  IVC be rescinded Labs reviewed    Ripley Fraise, MD 12/07/18 0011

## 2018-12-07 NOTE — Discharge Instructions (Signed)
Substance Abuse Treatment Programs ° °Intensive Outpatient Programs °[facility]     °[address]      °[place], [place]                   °[phone]      ° °[facility] °[address] °[place], [place] °[phone] ° ° Behavioral Health Outpatient     °(Inpatient and outpatient)     °[address]           °[phone]   ° °[facility] °[phone] (Suboxone and Methadone) ° °[address]      °[place], [place] [address]      °[phone]      ° °[address] °[place], [place] °852-3033 ° °[facility] (Outpatient/Inpatient, Chemical)    °(insurance only) [phone]      °       °[facility] (Groups & Residential) °[place], [place] °[phone] ° °   °[facility]     °[address]     °[place], [place]      °[phone]      ° °[facility] (for caregivers and family) °[address] °[place], [place] °336-299-4655 ° ° ° ° ° °Residential Treatment Programs °[facility]      °[address], [place], [place] [address]  °[phone]      ° °[facility] °[address] [place], [place] [address] °[phone] ° °[facility]        °[phone]      ° °[facility] °[phone] ° °[facility]             °[address]                                         °[place], [place]                                                °[phone] or [phone]                              ° °[facility] [place] °[address] °[place], [address] °[phone] ° °[facility]    °[address]      °[place], [place]     °[phone]      ° °[facility] °[address] °, [place] °[phone] ° °[facility]   °[address]     °[place], [place] [address]     °[phone]      °Admissions: 8am-3pm M-F ° °[facility] °[address] °[place],  [place] °[phone] ° °[facility]: Residential Program (90 Days)   °[place], [place]      °336-725-8389 or [phone]    ° °[facility] °[place], [place] °(Walk in Hours over the weekend or by referral) ° °[facility] °[address], [place], [place] [address] °([phone] ° °Crisis Mobile: [facility]:  1-877-626-1772 (for crisis response 24 hours a day) °[facility] Hotline:      1-800-256-2452 °Outpatient Psychiatry and Counseling ° °[facility]: Mobile Crisis   Management 24 hours:  1-877-626-1772 ° °[facility] sliding scale fee and walk in schedule: M-F 8am-12pm/1pm-3pm °[address]  °[place], [place] [address] °[phone] ° °[facility] °[address] °[place], [place] [address] °[phone] ° °[facility] (Formerly known [facility])- new patient walk-in appointments available Monday - Friday 8am -3pm.          °[address] °[place], [place] [address] °[phone] or crisis line- 336-676-6905 ° °[facility] Intensive Outpatient Therapy Program °[address] °[place], [place] [address] °336-832-9804 ° °[facility]                  °Crisis Services      °[phone]      °[address]     °[place], [place] [address]                ° °[facility]   °[facility] °[phone] °[address] °[place], [place] [address] ° ° °[facility]          °[address]  °[place], [place] [address]       °[phone] ° °[facility] °[address] °[place], [place] [address] °336-292-1510 ° °[facility]    °[address]    °[place], [place] [address]     °[phone]      ° °Parish McKinney, MD     °[address]     °[place] [address]     °[phone]     °  °[facility] °[address] °[place] [address] ° °[facility]     °[address]     °[place], [place]      °336-542-2076      ° °[facility] °Shamsher Ahluwalia, MD °[address] °[place], [place] [address] °[phone] ° °[facility]     °[address]     °[place], [place] [address]     °336-274-1237      ° °[facility] °[address] °[place], [place] [address] °336-854-4450 °Resources for Temporary Residential Assistance/Crisis Centers ° °DAY CENTERS °[facility] °M-F 8am-3pm   °[address] [place], [place] [address]   336-332-0824 °Services include: laundry, barbering, support groups, case management, phone  & computer access, showers, AA/NA mtgs, mental health/substance abuse nurse, job skills class, disability information, VA assistance, spiritual classes, etc.  ° °HOMELESS SHELTERS ° °[facility]     °[facility]   °[address], [place]     °[phone]       °       °[facility] (women and children)       °[address] °[place], [place] [address] °336-275-0820 °[email or web address] for application and process °Application Required ° °[facility]   °[address]    °[place] [address]     °[phone]       °             °[facility] °[address] °[place],  [address] °[phone] °336-235-0363(schedule application appt.) °Application Required ° °[facility] (women only)    °[address]     °[place],  [address]     °336-884-1039      °  Intake starts 6pm daily °Need valid ID, SSC, & Police report °[facility] [place] °[address] °[place], [place] °336-881-5420 °Application Required ° °[facility] (men only)     °[address]      °[place], [place]     °[phone]      ° °[facility] °(Pregnant women only) °[address] °[place], [place] °336-275-0206 ° °[facility]      °[address]      °[place], [place] [address]     °[phone]      °       °[facility] °[address] °[place], [place] °336-723-1848 °90 day commitment/SA/Application process ° °[facility](men only)     °[address]     °[place], [place]     °[phone]       °Check-in at 7pm     °       °[facility] °[address] °[place], [place] [address] °336-248-6684 °Men/Women/Women and Children must be there by 7 pm ° °[facility] °[place], [place] °336-722-8721                ° °

## 2018-12-13 ENCOUNTER — Telehealth: Payer: Self-pay

## 2018-12-13 NOTE — Telephone Encounter (Signed)
Spoke to patient's spouse. Unclear as to what she is concerned over. Encouraged her to call back with any concerns over medications or side effects. Per pt, he is tolerating the Trulicity well.

## 2018-12-13 NOTE — Telephone Encounter (Signed)
Pt spouse called states pt has been taking insulin for a while and recently added Trulicity.  Pt has a strong body odor like rotten eggs.  Is that normal?  Pt spouse Abigail [place]ts [phone].

## 2018-12-14 DIAGNOSIS — F250 Schizoaffective disorder, bipolar type: Secondary | ICD-10-CM | POA: Diagnosis not present

## 2019-01-08 ENCOUNTER — Other Ambulatory Visit: Payer: Self-pay | Admitting: Pharmacist

## 2019-01-08 DIAGNOSIS — E1165 Type 2 diabetes mellitus with hyperglycemia: Secondary | ICD-10-CM

## 2019-01-08 MED ORDER — ACCU-CHEK FASTCLIX LANCETS MISC: 11 refills | Status: DC

## 2019-01-08 MED ORDER — ACCU-CHEK GUIDE VI STRP: ORAL_STRIP | 11 refills | Status: DC

## 2019-01-08 NOTE — Telephone Encounter (Signed)
Thank you so much, I do not know why this took so long. I should have just called pharmacy myself when this all started

## 2019-01-08 NOTE — Telephone Encounter (Signed)
Joshua [name], I hate to bother you with this but could you contact patient's pharmacy and see if he was able to get refills of his strips/monitoring supplies? In office he gave me incorrect information on type of monitoring device/strips that he was using so the RX that I printed and had patient take to pharmacy was not correct and I not received paperwork from pharmacy that indicated what his exact need was and then this never got fully resolved

## 2019-01-08 NOTE — Telephone Encounter (Signed)
Called pt's pharmacy. They have not filled supplies since July and requested Accu Chek Guide test strips and fastclix lancets to be sent. I sent those in for him.

## 2019-01-24 ENCOUNTER — Other Ambulatory Visit: Payer: Self-pay

## 2019-01-24 ENCOUNTER — Ambulatory Visit (HOSPITAL_BASED_OUTPATIENT_CLINIC_OR_DEPARTMENT_OTHER): Payer: Medicare Other

## 2019-02-02 ENCOUNTER — Other Ambulatory Visit: Payer: Self-pay | Admitting: Family Medicine

## 2019-02-02 ENCOUNTER — Encounter: Payer: Self-pay | Admitting: Family Medicine

## 2019-02-02 ENCOUNTER — Other Ambulatory Visit: Payer: Self-pay

## 2019-02-02 ENCOUNTER — Ambulatory Visit: Payer: Medicare Other | Attending: Family Medicine | Admitting: Family Medicine

## 2019-02-02 DIAGNOSIS — G43909 Migraine, unspecified, not intractable, without status migrainosus: Secondary | ICD-10-CM

## 2019-02-02 DIAGNOSIS — J441 Chronic obstructive pulmonary disease with (acute) exacerbation: Secondary | ICD-10-CM

## 2019-02-02 DIAGNOSIS — E1165 Type 2 diabetes mellitus with hyperglycemia: Secondary | ICD-10-CM | POA: Diagnosis not present

## 2019-02-02 DIAGNOSIS — M5442 Lumbago with sciatica, left side: Secondary | ICD-10-CM

## 2019-02-02 DIAGNOSIS — F17210 Nicotine dependence, cigarettes, uncomplicated: Secondary | ICD-10-CM | POA: Diagnosis not present

## 2019-02-02 DIAGNOSIS — F250 Schizoaffective disorder, bipolar type: Secondary | ICD-10-CM

## 2019-02-02 DIAGNOSIS — G8929 Other chronic pain: Secondary | ICD-10-CM | POA: Diagnosis not present

## 2019-02-02 DIAGNOSIS — J9801 Acute bronchospasm: Secondary | ICD-10-CM

## 2019-02-02 MED ORDER — DOXYCYCLINE HYCLATE 100 MG PO TABS: 100.0000 mg | ORAL_TABLET | Freq: Two times a day (BID) | ORAL | 0 refills | Status: DC

## 2019-02-02 MED ORDER — VENTOLIN HFA 108 (90 BASE) MCG/ACT IN AERS: 2.0000 | INHALATION_SPRAY | RESPIRATORY_TRACT | 11 refills | Status: DC | PRN

## 2019-02-02 MED ORDER — RIZATRIPTAN BENZOATE 10 MG PO TABS: 10.0000 mg | ORAL_TABLET | ORAL | 2 refills | Status: DC | PRN

## 2019-02-02 NOTE — Progress Notes (Signed)
Virtual Visit via Telephone Note  I connected with Joshua [name] on 02/02/19 at  9:10 AM EDT by telephone and verified that I am speaking with the correct person using two identifiers.   I discussed the limitations, risks, security and privacy concerns of performing an evaluation and management service by telephone and the availability of in person appointments. I also discussed with the patient that there may be a patient responsible charge related to this service. The patient expressed understanding and agreed to proceed.  Patient Location: Home Provider Location: Home Office Others participating in call: none   History of Present Illness:       62 yo male seen in follow-up of chronic medical issues including Type 2 diabetes and patient with complaint of recent increase in cough, chest congestion related to COPD. Cough is non-productive at this time.  No fever or chills, no loss of sense of taste or smell.  No known exposure to anyone with COVID-19.  He also needs refill of albuterol inhaler.        He reports that he is compliant with his medicines for treatment of diabetes but he does not monitor his blood sugars very often.  He thinks that his blood sugars are controlled.  He has followed up with the clinical pharmacist and would like to continue regular follow-up with clinical pharmacist regarding his diabetes.  He denies current increased thirst or urinary frequency.  He does need refill of medication for treatment of migraine type headaches.  Migraine headaches usually occur at either temple/eye area and he does have sensitivity to light and noise during his headaches.  He has had no increase in frequency or intensity of his migraines but needs refill of medication for treatment.  He also continues to have issues with chronic low back pain which radiates down the left leg.  He continues to take gabapentin which does help to decrease the burning sensation associated with the pain.  Past  Medical History:  Diagnosis Date  . Back pain   . Bipolar disorder ([place])   . Cannabis use disorder, moderate, dependence ([place])   . Chronic pain   . Diabetes mellitus without complication ([place])   . Generalized anxiety disorder   . Headache   . Hypertension   . Schizoaffective disorder [facility])     Past Surgical History:  Procedure Laterality Date  . APPENDECTOMY  1970    Family History  Problem Relation Age of Onset  . Diabetes Mother   . Cancer Mother        unsure what type  . Heart disease Mother   . Hypertension Mother   . Dementia Father   . Diabetes Brother   . Dementia Maternal Grandfather   . Cancer Paternal Grandmother 89       breast  . Mental illness Neg Hx     Social History   Tobacco Use  . Smoking status: Current Every Day Smoker    Packs/day: 2.00    Types: Cigarettes  . Smokeless tobacco: Never Used  Substance Use Topics  . Alcohol use: Not Currently  . Drug use: Yes    Types: Marijuana    Comment: "have changed to hemp now" 09/17/17     Allergies  Allergen Reactions  . Imitrex [Sumatriptan] Other (See Comments)    mania       Observations/Objective: No vital signs or physical exam conducted as visit was done via telephone  Assessment and Plan: 2. Migraine without status migrainosus, not  intractable, unspecified migraine type Refill provided of Maxalt for continued treatment of migraine type headaches. - rizatriptan (MAXALT) 10 MG tablet; Take 1 tablet (10 mg total) by mouth as needed for migraine. May repeat in 2 hours if needed; max 4 pills in 24 hours  Dispense: 10 tablet; Refill: 2  1. COPD with acute exacerbation ([place]) Patient with COPD exacerbation and will be treated with doxycycline and refill provided of albuterol inhaler to use as needed for shortness of breath, cough or wheezing.  Patient is aware that he should go to the ED if he has worsening of shortness of breath or any concerns.  Will not prescribe prednisone taper at this time as  patient is also diabetic and has had issues with elevated blood sugars. - VENTOLIN HFA 108 (90 Base) MCG/ACT inhaler; Inhale 2 puffs into the lungs every 4 (four) hours as needed.  Dispense: 18 g; Refill: 11 - doxycycline (VIBRA-TABS) 100 MG tablet; Take 1 tablet (100 mg total) by mouth 2 (two) times daily.  Dispense: 20 tablt; Refill: 0  3. Type 2 diabetes mellitus with hyperglycemia, unspecified whether long term insulin use ([place]) Patient was able to improve his hemoglobin A1c to 7.6 when it was checked at his visit on 11/02/2018.  He reports that he has followed up with the clinical pharmacist.  He would like to have an additional visit with the clinical pharmacist in follow-up of his diabetes and patient needs to have repeat hemoglobin A1c. - Amb Referral to Clinical Pharmacist  4. Chronic low back pain with left-sided sciatica, unspecified back pain laterality He has been asked to consider referral back to orthopedics regarding his continued issues with low back pain with radiation.  Continue use of gabapentin for neuropathic pain.  5. Schizoaffective disorder, bipolar type ([place]) He is encouraged to remain compliant with mental health follow-up and medications for treatment of his schizoaffective disorder/bipolar disorder.  Follow Up Instructions:Return for Chronic issues; next week if breathing not improved, ED if breathing worsens.   I discussed the assessment and treatment plan with the patient. The patient was provided an opportunity to ask questions and all were answered. The patient agreed with the plan and demonstrated an understanding of the instructions.   The patient was advised to call back or seek an in-person evaluation if the symptoms worsen or if the condition fails to improve as anticipated.  I provided 12 minutes of non-face-to-face time during this encounter.   Antony Blackbird, MD

## 2019-02-05 ENCOUNTER — Other Ambulatory Visit: Payer: Self-pay | Admitting: Family Medicine

## 2019-02-05 DIAGNOSIS — F250 Schizoaffective disorder, bipolar type: Secondary | ICD-10-CM | POA: Diagnosis not present

## 2019-02-06 ENCOUNTER — Other Ambulatory Visit: Payer: Self-pay | Admitting: Family Medicine

## 2019-02-06 DIAGNOSIS — J449 Chronic obstructive pulmonary disease, unspecified: Secondary | ICD-10-CM

## 2019-02-06 DIAGNOSIS — J9801 Acute bronchospasm: Secondary | ICD-10-CM

## 2019-02-06 DIAGNOSIS — J441 Chronic obstructive pulmonary disease with (acute) exacerbation: Secondary | ICD-10-CM

## 2019-02-06 MED ORDER — BUDESONIDE-FORMOTEROL FUMARATE 160-4.5 MCG/ACT IN AERO: 2.0000 | INHALATION_SPRAY | Freq: Two times a day (BID) | RESPIRATORY_TRACT | 3 refills | Status: DC

## 2019-02-06 MED ORDER — PREDNISONE 20 MG PO TABS: ORAL_TABLET | ORAL | 0 refills | Status: DC

## 2019-02-06 NOTE — Telephone Encounter (Signed)
Please let patient know that Symbicort was refilled along with a short course of prednisone.  Please see if there is an appointment available this week with myself or Dr. Joya Gaskins.  Please also let patient know that he may want to go to urgent care if his symptoms are not improving or if they acutely worsen

## 2019-02-06 NOTE — Progress Notes (Signed)
Patient ID: Joshua [name], male   DOB: [birth year], 63 y.o.   MRN: [id number]   Patient left phone message stating that he continues to have discolored phlegm and is not feeling better.  Patient also requests refill of Symbicort.  We will also send in prescription for short course of prednisone.  Prednisone taper was not initially sent and due to patient's history of psychiatric disorders as prednisone may trigger psychosis in some people.  Patient however continues to be symptomatic despite treatment with doxycycline.

## 2019-02-06 NOTE — Telephone Encounter (Signed)
1) Medication(s) Requested (by name): symbicort  2) Pharmacy of Choice:  [facility] [place], [place] he flame is a yellow color and does not feel any better. Please follow up.

## 2019-02-21 ENCOUNTER — Ambulatory Visit: Payer: Medicare Other | Admitting: Critical Care Medicine

## 2019-02-28 ENCOUNTER — Ambulatory Visit: Payer: Medicare Other | Admitting: Critical Care Medicine

## 2019-03-12 ENCOUNTER — Other Ambulatory Visit: Payer: Self-pay | Admitting: Family Medicine

## 2019-03-12 DIAGNOSIS — F250 Schizoaffective disorder, bipolar type: Secondary | ICD-10-CM | POA: Diagnosis not present

## 2019-03-18 ENCOUNTER — Ambulatory Visit (HOSPITAL_COMMUNITY)
Admission: EM | Admit: 2019-03-18 | Discharge: 2019-03-18 | Disposition: A | Discharge status: Home / Self Care | Payer: Medicare Other | Attending: Family Medicine | Admitting: Family Medicine

## 2019-03-18 ENCOUNTER — Encounter (HOSPITAL_COMMUNITY): Payer: Self-pay

## 2019-03-18 ENCOUNTER — Other Ambulatory Visit: Payer: Self-pay

## 2019-03-18 DIAGNOSIS — E119 Type 2 diabetes mellitus without complications: Secondary | ICD-10-CM | POA: Insufficient documentation

## 2019-03-18 DIAGNOSIS — Z803 Family history of malignant neoplasm of breast: Secondary | ICD-10-CM | POA: Diagnosis not present

## 2019-03-18 DIAGNOSIS — G8929 Other chronic pain: Secondary | ICD-10-CM | POA: Diagnosis not present

## 2019-03-18 DIAGNOSIS — F1221 Cannabis dependence, in remission: Secondary | ICD-10-CM | POA: Insufficient documentation

## 2019-03-18 DIAGNOSIS — W540XXA Bitten by dog, initial encounter: Secondary | ICD-10-CM | POA: Insufficient documentation

## 2019-03-18 DIAGNOSIS — Z8249 Family history of ischemic heart disease and other diseases of the circulatory system: Secondary | ICD-10-CM | POA: Insufficient documentation

## 2019-03-18 DIAGNOSIS — I10 Essential (primary) hypertension: Secondary | ICD-10-CM | POA: Insufficient documentation

## 2019-03-18 DIAGNOSIS — Z79899 Other long term (current) drug therapy: Secondary | ICD-10-CM | POA: Insufficient documentation

## 2019-03-18 DIAGNOSIS — M549 Dorsalgia, unspecified: Secondary | ICD-10-CM

## 2019-03-18 DIAGNOSIS — Z7951 Long term (current) use of inhaled steroids: Secondary | ICD-10-CM | POA: Diagnosis not present

## 2019-03-18 DIAGNOSIS — Z809 Family history of malignant neoplasm, unspecified: Secondary | ICD-10-CM | POA: Diagnosis not present

## 2019-03-18 DIAGNOSIS — Z833 Family history of diabetes mellitus: Secondary | ICD-10-CM | POA: Insufficient documentation

## 2019-03-18 DIAGNOSIS — Z20828 Contact with and (suspected) exposure to other viral communicable diseases: Secondary | ICD-10-CM

## 2019-03-18 DIAGNOSIS — Z7952 Long term (current) use of systemic steroids: Secondary | ICD-10-CM | POA: Insufficient documentation

## 2019-03-18 DIAGNOSIS — Z881 Allergy status to other antibiotic agents status: Secondary | ICD-10-CM | POA: Insufficient documentation

## 2019-03-18 DIAGNOSIS — F17210 Nicotine dependence, cigarettes, uncomplicated: Secondary | ICD-10-CM | POA: Diagnosis not present

## 2019-03-18 DIAGNOSIS — S51832A Puncture wound without foreign body of left forearm, initial encounter: Secondary | ICD-10-CM | POA: Diagnosis not present

## 2019-03-18 DIAGNOSIS — J441 Chronic obstructive pulmonary disease with (acute) exacerbation: Secondary | ICD-10-CM | POA: Diagnosis not present

## 2019-03-18 DIAGNOSIS — Z818 Family history of other mental and behavioral disorders: Secondary | ICD-10-CM | POA: Diagnosis not present

## 2019-03-18 DIAGNOSIS — M1990 Unspecified osteoarthritis, unspecified site: Secondary | ICD-10-CM | POA: Diagnosis not present

## 2019-03-18 DIAGNOSIS — G894 Chronic pain syndrome: Secondary | ICD-10-CM | POA: Diagnosis not present

## 2019-03-18 DIAGNOSIS — Z20822 Contact with and (suspected) exposure to covid-19: Secondary | ICD-10-CM

## 2019-03-18 DIAGNOSIS — F250 Schizoaffective disorder, bipolar type: Secondary | ICD-10-CM | POA: Diagnosis not present

## 2019-03-18 DIAGNOSIS — Z794 Long term (current) use of insulin: Secondary | ICD-10-CM | POA: Diagnosis not present

## 2019-03-18 MED ORDER — DOXYCYCLINE HYCLATE 100 MG PO CAPS: 100.0000 mg | ORAL_CAPSULE | Freq: Two times a day (BID) | ORAL | 0 refills | Status: DC

## 2019-03-18 MED ORDER — TRAMADOL HCL 50 MG PO TABS: 50.0000 mg | ORAL_TABLET | Freq: Four times a day (QID) | ORAL | 0 refills | Status: DC | PRN

## 2019-03-18 MED ORDER — PREDNISONE 20 MG PO TABS: 20.0000 mg | ORAL_TABLET | Freq: Two times a day (BID) | ORAL | 0 refills | Status: DC

## 2019-03-18 NOTE — ED Provider Notes (Signed)
[place]    CSN: [id number] Arrival date & time: 03/18/19  1248      History   Chief Complaint Chief Complaint  Patient presents with  . Back Pain  . Animal Bite    HPI Joshua [name] is a 63 y.o. male.   HPI  Patient is here with 3 issues.  First he has a dog bite on his left forearm.  Last time he had a dog bite became infected.  He would like to have this looked at.  This is his own dog.  Dog has its shots. Second problem is COPD exacerbation.  He states he is coughing up "buckets" of phlegm.  He states when he gets like this he needs antibiotics and prednisone.  No fever.  Moderate shortness of breath.  No known exposure to coronavirus.  He states he has been staying home, and wearing a mask when he goes out. His third problem is chronic back pain.  He states that he was previously under the care of a pain specialist and receives tramadol.  He quit going to this doctor about a year ago.  He is here requesting a refill of tramadol.  He states his primary care doctor promised to refer him to a new pain clinic, but this has not happened.  He states that he does not get improvement from anti-inflammatories.  He does not get improvement with gabapentin.  He does not get improvement from Robaxin.  Pain is in his central low back.  No radiation.  No numbness or weakness into the legs.  No trauma or fall.  Past Medical History:  Diagnosis Date  . Back pain   . Bipolar disorder ([place])   . Cannabis use disorder, moderate, dependence ([place])   . Chronic pain   . Diabetes mellitus without complication ([place])   . Generalized anxiety disorder   . Headache   . Hypertension   . Schizoaffective disorder [facility])     Patient Active Problem List   Diagnosis Date Noted  . Schizoaffective disorder ([place]) 07/24/2018  . Blister of right foot 10/29/2016  . Chronic pain syndrome 10/14/2016  . Osteoarthritis 10/14/2016  . T2DM (type 2 diabetes mellitus) ([place]) 10/14/2016  . HTN  (hypertension) 10/14/2016  . Schizoaffective disorder, bipolar type ([place]) 05/10/2016  . Generalized anxiety disorder 05/04/2016    Past Surgical History:  Procedure Laterality Date  . APPENDECTOMY  1970       Home Medications    Prior to Admission medications   Medication Sig Start Date End Date Taking? Authorizing Provider  Accu-Chek FastClix Lancets MISC Use to check blood sugar daily. 01/08/19   Fulp, Cammie, MD  albuterol (PROVENTIL) (2.5 MG/3ML) 0.083% nebulizer solution Take 3 mLs (2.5 mg total) by nebulization every 6 (six) hours as needed for wheezing or shortness of breath. 09/12/18   Fulp, Cammie, MD  atorvastatin (LIPITOR) 40 MG tablet Take 1 tablet (40 mg total) by mouth daily. 10/13/18   Fulp, Cammie, MD  benztropine (COGENTIN) 0.5 MG tablet Take 1 tablet (0.5 mg total) by mouth 2 (two) times daily. 07/27/18   [name], MD  Blood Glucose Monitoring Suppl (TRUE METRIX METER) w/Device KIT Check blood sugars fasting and at bedtime 07/27/18   [name], MD  budesonide-formoterol [facility]) 160-4.5 MCG/ACT inhaler Inhale 2 puffs into the lungs 2 (two) times daily. 02/06/19   Fulp, Cammie, MD  diclofenac (CATAFLAM) 50 MG tablet One pill twice daily as needed for pain; take after eating  Patient taking differently: Take 50 mg by mouth 2 (two) times daily as needed (pain).  11/02/18   Fulp, Cammie, MD  divalproex (DEPAKOTE ER) 500 MG 24 hr tablet Take 3 tablets (1,500 mg total) by mouth at bedtime. 07/27/18   [name], MD  doxycycline (VIBRAMYCIN) 100 MG capsule Take 1 capsule (100 mg total) by mouth 2 (two) times daily. 03/18/19   Raylene Everts, MD  gabapentin (NEURONTIN) 300 MG capsule Take one pill twice per day and two at bedtime 11/02/18   Fulp, Cammie, MD  glucose blood (ACCU-CHEK GUIDE) test strip Use as instructed to check blood sugar three times daily 01/08/19   Fulp, Cammie, MD  Insulin Glargine (BASAGLAR KWIKPEN) 100 UNIT/ML SOPN INJECT 30 UNITS INTO THE SKIN  DAILY 03/12/19   Fulp, Cammie, MD  Insulin Pen Needle (TRUEPLUS PEN NEEDLES) 32G X 4 MM MISC Use as directed to inject insulin 11/02/18   Fulp, Cammie, MD  ipratropium (ATROVENT) 0.02 % nebulizer solution Take 2.5 mLs (0.5 mg total) by nebulization 4 (four) times daily. With or instead of the Albuterol 07/17/18   Rodriguez-Southworth, Sunday Spillers, PA-C  losartan (COZAAR) 100 MG tablet TAKE 1 TABLET BY MOUTH EVERY DAY TO LOWER BLOOD PRESSURE(STOP LISINOPRIL) 11/27/18   Fulp, Cammie, MD  methocarbamol (ROBAXIN) 500 MG tablet Take 1 tablet (500 mg total) by mouth every 8 (eight) hours as needed for muscle spasms. 11/02/18   Fulp, Cammie, MD  nicotine (NICODERM CQ - DOSED IN MG/24 HOURS) 21 mg/24hr patch Place 1 patch (21 mg total) onto the skin daily. 07/28/18   [name], MD  paliperidone (INVEGA SUSTENNA) 156 MG/ML SUSY injection Inject 1 mL (156 mg total) into the muscle once for 1 dose. Due 5/1 07/27/18 02/02/19  [name], MD  prazosin (MINIPRESS) 2 MG capsule Take 1 capsule (2 mg total) by mouth at bedtime. 07/27/18   [name], MD  predniSONE (DELTASONE) 20 MG tablet Take 1 tablet (20 mg total) by mouth 2 (two) times daily with a meal. 03/18/19   Raylene Everts, MD  propranolol (INDERAL) 20 MG tablet Take 1 tablet (20 mg total) by mouth 2 (two) times daily. 07/27/18   [name], MD  rizatriptan (MAXALT) 10 MG tablet Take 1 tablet (10 mg total) by mouth as needed for migraine. May repeat in 2 hours if needed; max 4 pills in 24 hours 02/02/19   Fulp, Cammie, MD  traMADol (ULTRAM) 50 MG tablet Take 1 tablet (50 mg total) by mouth every 6 (six) hours as needed. 03/18/19   Raylene Everts, MD  TRULICITY 6.96 EX/5.2WU SOPN ADMINISTER 0.5 ML UNDER THE SKIN 1 TIME A WEEK 02/06/19   Fulp, Cammie, MD  VENTOLIN HFA 108 (90 Base) MCG/ACT inhaler Inhale 2 puffs into the lungs every 4 (four) hours as needed. 02/02/19   Antony Blackbird, MD    Family History Family History  Problem Relation Age of Onset   . Diabetes Mother   . Cancer Mother        unsure what type  . Heart disease Mother   . Hypertension Mother   . Dementia Father   . Diabetes Brother   . Dementia Maternal Grandfather   . Cancer Paternal Grandmother 44       breast  . Mental illness Neg Hx     Social History Social History   Tobacco Use  . Smoking status: Current Every Day Smoker    Packs/day: 2.00    Types: Cigarettes  .  Smokeless tobacco: Never Used  Substance Use Topics  . Alcohol use: Not Currently  . Drug use: Yes    Types: Marijuana    Comment: "have changed to hemp now" 09/17/17     Allergies   Imitrex [sumatriptan]   Review of Systems Review of Systems  Constitutional: Negative for chills and fever.  HENT: Negative for ear pain and sore throat.   Eyes: Negative for pain and visual disturbance.  Respiratory: Positive for cough, shortness of breath and wheezing.   Cardiovascular: Negative for chest pain and palpitations.  Gastrointestinal: Negative for abdominal pain and vomiting.  Genitourinary: Negative for dysuria and hematuria.  Musculoskeletal: Positive for back pain. Negative for arthralgias.  Skin: Positive for wound. Negative for color change and rash.  Neurological: Negative for seizures and syncope.  All other systems reviewed and are negative.    Physical Exam Triage Vital Signs ED Triage Vitals  Enc Vitals Group     BP 03/18/19 1305 (!) 186/90     Pulse --      Resp 03/18/19 1305 19     Temp 03/18/19 1305 98.4 F (36.9 C)     Temp Source 03/18/19 1305 Oral     SpO2 03/18/19 1305 95 %     Weight --      Height --      Head Circumference --      Peak Flow --      Pain Score 03/18/19 1303 9     Pain Loc --      Pain Edu? --      Excl. in [place]? --    No data found.  Updated Vital Signs BP (!) 186/90 (BP Location: Left Arm)   Temp 98.4 F (36.9 C) (Oral)   Resp 19   SpO2 95%     Physical Exam Constitutional:      General: He is not in acute distress.     Appearance: He is well-developed. He is obese. He is not ill-appearing.  HENT:     Head: Normocephalic and atraumatic.     Nose: Congestion present.     Mouth/Throat:     Mouth: Mucous membranes are moist.  Eyes:     Conjunctiva/sclera: Conjunctivae normal.     Pupils: Pupils are equal, round, and reactive to light.  Neck:     Musculoskeletal: Normal range of motion.  Cardiovascular:     Rate and Rhythm: Normal rate and regular rhythm.     Heart sounds: Normal heart sounds.  Pulmonary:     Effort: Pulmonary effort is normal. No respiratory distress.     Breath sounds: Wheezing and rhonchi present.     Comments: Few wheezes with inspiration.  Central rhonchi. Abdominal:     General: There is no distension.     Palpations: Abdomen is soft.     Comments: Protuberant  Musculoskeletal: Normal range of motion.     Comments: Moderate tenderness in the middle lumbar region centrally and over the muscles.  Very limited range of motion.  Strength sensation range of motion and reflexes are intact in the lower extremities  Lymphadenopathy:     Cervical: No cervical adenopathy.  Skin:    General: Skin is warm and dry.     Comments: The left forearm has 2 puncture wounds that are a couple days old, healing, some bruising around the wounds but no erythema drainage or sign of infection  Neurological:     General: No focal deficit present.  Mental Status: He is alert.  Psychiatric:        Mood and Affect: Mood normal.        Behavior: Behavior normal.      UC Treatments / Results  Labs (all labs ordered are listed, but only abnormal results are displayed) Labs Reviewed  NOVEL CORONAVIRUS, NAA (HOSP ORDER, SEND-OUT TO REF LAB; TAT 18-24 HRS)    EKG   Radiology No results found.  Procedures Procedures (including critical care time)  Medications Ordered in UC Medications - No data to display  Initial Impression / Assessment and Plan / UC Course  I have reviewed the triage  vital signs and the nursing notes.  Pertinent labs & imaging results that were available during my care of the patient were reviewed by me and considered in my medical decision making (see chart for details).     At discharge patient request coronavirus testing.  Unclear etiology.  Specimen was obtained by the nurses. Final Clinical Impressions(s) / UC Diagnoses   Final diagnoses:  Dog bite, initial encounter  Exacerbation of chronic back pain  COPD exacerbation ([place])  Encounter for laboratory testing for COVID-19 virus     Discharge Instructions     You have been prescribed Vibramycin for your chest infection.  Take 2 times a day You have been prescribed prednisone to take 2 times a day.  This will help your lungs and your back Take tramadol as needed for pain.  Future refills of tramadol will need to come from your pain or back provider Watch the dog bite for infection.  Return for redness pain swelling or drainage     ED Prescriptions    Medication Sig Dispense Auth. Provider   doxycycline (VIBRAMYCIN) 100 MG capsule Take 1 capsule (100 mg total) by mouth 2 (two) times daily. 20 capsule Raylene Everts, MD   predniSONE (DELTASONE) 20 MG tablet Take 1 tablet (20 mg total) by mouth 2 (two) times daily with a meal. 10 tablet Raylene Everts, MD   traMADol (ULTRAM) 50 MG tablet Take 1 tablet (50 mg total) by mouth every 6 (six) hours as needed. 50 tablet Raylene Everts, MD     I have reviewed the PDMP during this encounter.   Raylene Everts, MD 03/18/19 [phone]

## 2019-03-18 NOTE — Discharge Instructions (Addendum)
You have been prescribed Vibramycin for your chest infection.  Take 2 times a day You have been prescribed prednisone to take 2 times a day.  This will help your lungs and your back Take tramadol as needed for pain.  Future refills of tramadol will need to come from your pain or back provider Watch the dog bite for infection.  Return for redness pain swelling or drainage

## 2019-03-18 NOTE — ED Triage Notes (Signed)
Patient presents to Urgent Care with complaints of acute on chronic back pain. Patient reports he has had MRIs in the past that show his chronic damage, was given pain medication by his otho DR recently, was not given any additional pain medication by his pain management clinic. Pt endorses dog bite to left forearm, states it is up to date on rabies.

## 2019-03-19 LAB — NOVEL CORONAVIRUS, NAA (HOSP ORDER, SEND-OUT TO REF LAB; TAT 18-24 HRS): SARS-CoV-2, NAA: NOT DETECTED

## 2019-03-27 ENCOUNTER — Encounter: Payer: Self-pay | Admitting: Critical Care Medicine

## 2019-03-27 ENCOUNTER — Telehealth (HOSPITAL_BASED_OUTPATIENT_CLINIC_OR_DEPARTMENT_OTHER): Payer: Medicare Other | Admitting: Critical Care Medicine

## 2019-03-27 DIAGNOSIS — G894 Chronic pain syndrome: Secondary | ICD-10-CM

## 2019-03-27 DIAGNOSIS — J209 Acute bronchitis, unspecified: Secondary | ICD-10-CM | POA: Diagnosis not present

## 2019-03-27 DIAGNOSIS — M1990 Unspecified osteoarthritis, unspecified site: Secondary | ICD-10-CM | POA: Diagnosis not present

## 2019-03-27 DIAGNOSIS — J449 Chronic obstructive pulmonary disease, unspecified: Secondary | ICD-10-CM

## 2019-03-27 MED ORDER — PREDNISONE 10 MG PO TABS: ORAL_TABLET | ORAL | 0 refills | Status: DC

## 2019-03-27 MED ORDER — AEROCHAMBER MV MISC: 0 refills | Status: AC

## 2019-03-27 MED ORDER — FLUTTER DEVI: 0 refills | Status: AC

## 2019-03-27 MED ORDER — LOSARTAN POTASSIUM-HCTZ 100-25 MG PO TABS: 1.0000 | ORAL_TABLET | Freq: Every day | ORAL | 3 refills | Status: DC

## 2019-03-27 NOTE — Progress Notes (Signed)
Patient ID: [name], male   DOB: [birth year], 63 y.o.   MRN: [id number]  Virtual Visit via Video Note  I connected with@ on 03/27/19 at 2pm by a video enabled telemedicine application and verified that I am speaking with the correct person using two identifiers.   Consent:  I discussed the limitations, risks, security and privacy concerns of performing an evaluation and management service by video visit and the availability of in person appointments. I also discussed with the patient that there may be a patient responsible charge related to this service. The patient expressed understanding and agreed to proceed.  Location of patient: The patient was at home  Location of provider: I was in my office  Persons participating in call the patient's spouse was also on the video visit  History of present illness This is a video telehealth visit for a patient who has underlying COPD and was needing further follow-up upon referral from the patient's primary care provider.  The patient's been on several rounds of prednisone antibiotics with only improvement while on prednisone.  He states he is short of breath at rest and with exertion.  He has had problems with breathing for many years got worse over the past year.  He also has underlying diabetes on Trulicity and insulin long-acting.  He also states he has chronic low back pain and has difficulty ambulating this affects his breathing as well.  His weight is gone up as well which is aggravated his chronic pain.  He had a pain management doctor but has not seen that doctor since November 2019.  The patient is on tramadol from an urgent care visit he states this is helped to some degree.  He states nonsteroidals and gabapentin have not been of use in the past.  He also states that he has increased fluid retention and is questioning whether his losartan can be adjusted to accommodate reduction in fluid retention.  He still smokes 2 pack a day of  cigarettes and previous smoked as much as 4 packs a day.  He has not had a pulmonary function test.  Chest x-ray in July of this year showed no active disease.  The patient does have a Symbicort inhaler and is using this however only on an as needed basis along with albuterol which is as needed  Note when I tried to do a medication reconciliation the patient was not clear on which medicines he was taking and which ones he had stopped and he has a list of nearly 40 medicines in his E HR.  Constitutional:   No  weight loss, night sweats,  Fevers, chills, fatigue, lassitude. HEENT:   No headaches,  Difficulty swallowing,  Tooth/dental problems,  Sore throat,                No sneezing, itching, ear ache, nasal congestion, post nasal drip,   CV:  No chest pain,  Orthopnea, PND, swelling in lower extremities, anasarca, dizziness, palpitations  GI  No heartburn, indigestion, abdominal pain, nausea, vomiting, diarrhea, change in bowel habits, loss of appetite  Resp:  shortness of breath with exertion or at rest.   excess mucus,  productive cough,  No non-productive cough,  No coughing up of blood.  No change in color of mucus.  No wheezing.  No chest wall deformity  Skin: no rash or lesions.  GU: no dysuria, change in color of urine, no urgency or frequency.  No flank pain.  MS:  No joint  pain or swelling.  No decreased range of motion.   back pain.  Psych:  No change in mood or affect. No depression or anxiety.  No memory loss.  Observations/Objective: I observe the patient on video he was in no acute distress sitting in his kitchen.  I did asked the patient to demonstrate his inhaler technique and it was quite poor.  He had very poor timing.  Also I uncovered that he was not using his Symbicort on a regular basis.  Assessment and Plan: #1 COPD with acute exacerbation finishing current course of doxycycline  At some point this patient will need full pulmonary function studies, I went over with  the patient through the video system proper technique of the [facility] device Symbicort and to take this on a regular basis.  He had improved insight after I went through this educational process and did use teach back  Also will plan to give pulse prednisone 40 mg a day with a slow taper over 16 days  The patient will finish his current course of doxycycline  Because of timing techniques with his HFA will prescribe also an AeroChamber and also obtain for the patient a flutter valve for mucus clearance  #2 chronic pain of the lower back.  For this will refer to pain management [facility] and have instructed the patient to use the tramadol he was given at urgent care.  He was given 50 tablets on November 23 and he states he has plenty of medicine left therefore I did not refill this medication at this time.  He was being given tramadol 170  To 180 at a time on a monthly basis but this appeared to and after a April 05, 2018 prescription from I did review the [facility] and it shows that he has had very few tramadol prescriptions since the first of this year however he was going to a pain clinic from Dr. Bethena Roys  #3 hypertension we will plan to switch losartan to losartan HCT 100 mg/12.5 daily and a prescription for this was sent in as well  Follow Up Instructions: The patient knows a follow-up visit will occur in the next 3 weeks   I discussed the assessment and treatment plan with the patient. The patient was provided an opportunity to ask questions and all were answered. The patient agreed with the plan and demonstrated an understanding of the instructions.   The patient was advised to call back or seek an in-person evaluation if the symptoms worsen or if the condition fails to improve as anticipated.  I provided 30 minutes of non-face-to-face time during this encounter  including  median intraservice time , review of notes, labs, imaging, medications  and explaining  diagnosis and management to the patient .    Asencion Noble, MD

## 2019-04-02 ENCOUNTER — Telehealth: Payer: Self-pay | Admitting: Family Medicine

## 2019-04-02 MED ORDER — TRAMADOL HCL 50 MG PO TABS: 50.0000 mg | ORAL_TABLET | Freq: Four times a day (QID) | ORAL | 0 refills | Status: DC | PRN

## 2019-04-02 NOTE — Telephone Encounter (Signed)
1) Medication(s) Requested (by name): Tramadol  2) Pharmacy of Choice: [facility], [address] AT [place]  3) Special Requests:   Approved medications will be sent to the pharmacy, we will reach out if there is an issue.  Requests made after 3pm may not be addressed until the following business day!  If a patient is unsure of the name of the medication(s) please note and ask patient to call back when they are able to provide all info, do not send to responsible party until all information is available!

## 2019-04-02 NOTE — Telephone Encounter (Signed)
Tramadol refill requested  And authorized one fill until he gets [facility] referral

## 2019-04-09 ENCOUNTER — Ambulatory Visit (HOSPITAL_BASED_OUTPATIENT_CLINIC_OR_DEPARTMENT_OTHER): Payer: Medicare Other

## 2019-04-09 ENCOUNTER — Encounter (HOSPITAL_BASED_OUTPATIENT_CLINIC_OR_DEPARTMENT_OTHER): Payer: Self-pay

## 2019-04-09 ENCOUNTER — Other Ambulatory Visit: Payer: Self-pay

## 2019-04-09 NOTE — Progress Notes (Signed)
Subjective:    Patient ID: Joshua [name], male    DOB: 01/28/1956, 63 y.o.   MRN: [id number]  This is a video telehealth visit for a patient who has underlying COPD and was needing further follow-up upon referral from the patient's primary care provider.  The patient's been on several rounds of prednisone antibiotics with only improvement while on prednisone.  He states he is short of breath at rest and with exertion.  He has had problems with breathing for many years got worse over the past year.  He also has underlying diabetes on Trulicity and insulin long-acting.  He also states he has chronic low back pain and has difficulty ambulating this affects his breathing as well.  His weight is gone up as well which is aggravated his chronic pain.  He had a pain management doctor but has not seen that doctor since November 2019.  The patient is on tramadol from an urgent care visit he states this is helped to some degree.  He states nonsteroidals and gabapentin have not been of use in the past.  He also states that he has increased fluid retention and is questioning whether his losartan can be adjusted to accommodate reduction in fluid retention.  He still smokes 2 pack a day of cigarettes and previous smoked as much as 4 packs a day.  He has not had a pulmonary function test.  Chest x-ray in July of this year showed no active disease.  The patient does have a Symbicort inhaler and is using this however only on an as needed basis along with albuterol which is as needed  Note when I tried to do a medication reconciliation the patient was not clear on which medicines he was taking and which ones he had stopped and he has a list of nearly 40 medicines in his E HR.  04/10/2019 This is a follow-up visit from a video visit that occurred recently.  The patient was given doxycycline and pulse prednisone for COPD exacerbation and is maintaining Symbicort daily.  The patient was referred for the COPD  evaluation.  I also refill the patient's tramadol at the last visit.  He has pending pulmonary function test which have yet to be obtained.  He does state that his back is improved on the tramadol.  He also states that he has broken teeth and they will bite into his gum and mouth causing pain in the mouth.  The tramadol helps to some degree but is requesting a mouthwash.  He has not seen a dentist recently.  Patient is still smoking 2 packs a day of cigarettes.  The patient did bring all his current active medications in with him today and I did a complete medication reconciliation see is current new medication list   Past Medical History:  Diagnosis Date  . Back pain   . Bipolar disorder ([place])   . Cannabis use disorder, moderate, dependence ([place])   . Chronic pain   . Diabetes mellitus without complication ([place])   . Generalized anxiety disorder   . Headache   . Hypertension   . Schizoaffective disorder ([place])      Family History  Problem Relation Age of Onset  . Diabetes Mother   . Cancer Mother        unsure what type  . Heart disease Mother   . Hypertension Mother   . Dementia Father   . Diabetes Brother   . Dementia Maternal Grandfather   . Cancer  Paternal Grandmother 17       breast  . Mental illness Neg Hx      Social History   Socioeconomic History  . Marital status: Married    Spouse name: Not on file  . Number of children: Not on file  . Years of education: Not on file  . Highest education level: Not on file  Occupational History  . Not on file  Tobacco Use  . Smoking status: Current Every Day Smoker    Packs/day: 2.00    Types: Cigarettes  . Smokeless tobacco: Never Used  Substance and Sexual Activity  . Alcohol use: Not Currently  . Drug use: Yes    Types: Marijuana    Comment: "have changed to hemp now" 09/17/17  . Sexual activity: Not on file  Other Topics Concern  . Not on file  Social History Narrative  . Not on file   Social Determinants of  Health   Financial Resource Strain:   . Difficulty of Paying Living Expenses: Not on file  Food Insecurity:   . Worried About Charity fundraiser in the Last Year: Not on file  . Ran Out of Food in the Last Year: Not on file  Transportation Needs:   . Lack of Transportation (Medical): Not on file  . Lack of Transportation (Non-Medical): Not on file  Physical Activity:   . Days of Exercise per Week: Not on file  . Minutes of Exercise per Session: Not on file  Stress:   . Feeling of Stress : Not on file  Social Connections:   . Frequency of Communication with Friends and Family: Not on file  . Frequency of Social Gatherings with Friends and Family: Not on file  . Attends Religious Services: Not on file  . Active Member of Clubs or Organizations: Not on file  . Attends Archivist Meetings: Not on file  . Marital Status: Not on file  Intimate Partner Violence:   . Fear of Current or Ex-Partner: Not on file  . Emotionally Abused: Not on file  . Physically Abused: Not on file  . Sexually Abused: Not on file     Allergies  Allergen Reactions  . Imitrex [Sumatriptan] Other (See Comments)    mania     Outpatient Medications Prior to Visit  Medication Sig Dispense Refill  . Accu-Chek FastClix Lancets MISC Use to check blood sugar daily. 102 each 11  . albuterol (PROVENTIL) (2.5 MG/3ML) 0.083% nebulizer solution Take 3 mLs (2.5 mg total) by nebulization every 6 (six) hours as needed for wheezing or shortness of breath. 75 mL 2  . Blood Glucose Monitoring Suppl (TRUE METRIX METER) w/Device KIT Check blood sugars fasting and at bedtime 1 kit 0  . budesonide-formoterol (SYMBICORT) 160-4.5 MCG/ACT inhaler Inhale 2 puffs into the lungs 2 (two) times daily. 1 Inhaler 3  . diclofenac (CATAFLAM) 50 MG tablet One pill twice daily as needed for pain; take after eating (Patient taking differently: Take 50 mg by mouth 2 (two) times daily as needed (pain). ) 60 tablet 4  . divalproex  (DEPAKOTE ER) 500 MG 24 hr tablet Take 3 tablets (1,500 mg total) by mouth at bedtime. 90 tablet 11  . gabapentin (NEURONTIN) 300 MG capsule Take one pill twice per day and two at bedtime 120 capsule 5  . glucose blood (ACCU-CHEK GUIDE) test strip Use as instructed to check blood sugar three times daily 100 each 11  . Insulin Glargine (BASAGLAR KWIKPEN) 100 UNIT/ML  SOPN INJECT 30 UNITS INTO THE SKIN DAILY (Patient taking differently: 34 Units daily. ) 15 mL 2  . Insulin Pen Needle (TRUEPLUS PEN NEEDLES) 32G X 4 MM MISC Use as directed to inject insulin 100 each 11  . ipratropium (ATROVENT) 0.02 % nebulizer solution Take 2.5 mLs (0.5 mg total) by nebulization 4 (four) times daily. With or instead of the Albuterol 75 mL 0  . losartan-hydrochlorothiazide (HYZAAR) 100-25 MG tablet Take 1 tablet by mouth daily. 90 tablet 3  . Respiratory Therapy Supplies (FLUTTER) DEVI Use with 4 times daily 1 each 0  . Spacer/Aero-Holding Chambers (AEROCHAMBER MV) inhaler Use as instructed 1 each 0  . traMADol (ULTRAM) 50 MG tablet Take 1 tablet (50 mg total) by mouth every 6 (six) hours as needed. 50 tablet 0  . TRULICITY 2.35 TI/1.4ER SOPN ADMINISTER 0.5 ML UNDER THE SKIN 1 TIME A WEEK 2 mL 2  . VENTOLIN HFA 108 (90 Base) MCG/ACT inhaler Inhale 2 puffs into the lungs every 4 (four) hours as needed. 18 g 11  . predniSONE (DELTASONE) 10 MG tablet Take 4 for four days 3 for four days 2 for four days 1 for four days 40 tablet 0  . methocarbamol (ROBAXIN) 500 MG tablet Take 1 tablet (500 mg total) by mouth every 8 (eight) hours as needed for muscle spasms. (Patient not taking: Reported on 04/10/2019) 90 tablet 5  . nicotine (NICODERM CQ - DOSED IN MG/24 HOURS) 21 mg/24hr patch Place 1 patch (21 mg total) onto the skin daily. 28 patch 0  . paliperidone (INVEGA SUSTENNA) 156 MG/ML SUSY injection Inject 1 mL (156 mg total) into the muscle once for 1 dose. Due 5/1 1 mL 11  . atorvastatin (LIPITOR) 40 MG tablet Take 1 tablet (40  mg total) by mouth daily. (Patient not taking: Reported on 04/10/2019) 90 tablet 0  . benztropine (COGENTIN) 0.5 MG tablet Take 1 tablet (0.5 mg total) by mouth 2 (two) times daily. (Patient not taking: Reported on 04/10/2019) 60 tablet 2  . doxycycline (VIBRAMYCIN) 100 MG capsule Take 1 capsule (100 mg total) by mouth 2 (two) times daily. (Patient not taking: Reported on 04/10/2019) 20 capsule 0  . hydrOXYzine (ATARAX/VISTARIL) 50 MG tablet hydroxyzine HCl 50 mg tablet    . propranolol (INDERAL) 20 MG tablet Take 1 tablet (20 mg total) by mouth 2 (two) times daily. (Patient not taking: Reported on 04/10/2019) 60 tablet 0  . rizatriptan (MAXALT) 10 MG tablet Take 1 tablet (10 mg total) by mouth as needed for migraine. May repeat in 2 hours if needed; max 4 pills in 24 hours (Patient not taking: Reported on 04/10/2019) 10 tablet 2  . traZODone (DESYREL) 100 MG tablet Take 200 mg by mouth at bedtime as needed.     No facility-administered medications prior to visit.    Review of Systems Constitutional:   No  weight loss, night sweats,  Fevers, chills, fatigue, lassitude. HEENT:    headaches,  Difficulty swallowing,  Tooth/dental problems,  Sore throat,                No sneezing, itching, ear ache, nasal congestion, post nasal drip,   CV:  No chest pain,  Orthopnea, PND, swelling in lower extremities, anasarca, dizziness, palpitations  GI  No heartburn, indigestion, abdominal pain, nausea, vomiting, diarrhea, change in bowel habits, loss of appetite  Resp:  shortness of breath with exertion or at rest.  No excess mucus,  productive cough,  No non-productive cough,  No coughing up of blood.  No change in color of mucus.  No wheezing.  No chest wall deformity  Skin: no rash or lesions.  GU: no dysuria, change in color of urine, no urgency or frequency.  No flank pain.  MS:  No joint pain or swelling.  No decreased range of motion.  No back pain.  Psych:  No change in mood or affect. No  depression or anxiety.  No memory loss.      Objective:   Physical Exam   Vitals:   04/10/19 1057  BP: (!) 162/113  Pulse: 89  Resp: 16  SpO2: 97%  Weight: 255 lb (115.7 kg)    Gen: Pleasant, obese, in no distress,  normal affect  ENT: No lesions,  Mouth ulceration in the cheek mucosa on the left and also in the lateral portion of the tongue with multiple carious teeth some are fractured significant periodontal disease oropharynx clear, no postnasal drip  Neck: No JVD, no TMG, no carotid bruits  Lungs: No use of accessory muscles, no dullness to percussion, distant breath sounds with prolonged spire Tory phase  Cardiovascular: RRR, heart sounds normal, no murmur or gallops, no peripheral edema  Abdomen: soft and NT, no HSM,  BS normal  Musculoskeletal: No deformities, no cyanosis or clubbing  Neuro: alert, non focal  Skin: Warm, no lesions or rashes       Assessment & Plan:  I personally reviewed all images and lab data in the [facility] system as well as any outside material available during this office visit and agree with the  radiology impressions.   HTN (hypertension) Hypertension not under good control but currently on losartan HCT 100/25 we will continue this daily  The patient needs to pursue smoking cessation  COPD with chronic bronchitis ([place]) COPD with recurrent exacerbation we will now order pulmonary function studies and ensure this is completed  No additional steroids or antibiotics indicated  We will continue Symbicort and albuterol as needed and obtain for the patient a spacer device for his HFA inhalers and a flutter valve  T2DM (type 2 diabetes mellitus) ([place]) No changes in the patient's current dosing of insulin  Follow-up per primary care  Chronic pain syndrome Chronic pain syndrome  I had refilled tramadol with a count of 50 at the last visit in November the patient does not read refills at this time and we have placed a referral to pain  medicine  Schizoaffective disorder, bipolar type [facility]) The patient does have a psychiatrist but apparently has not been in for his Invega injection recently and he needs to follow-up with the psychiatry   Zack was seen today for follow-up.  Diagnoses and all orders for this visit:  COPD with chronic bronchitis ([place]) -     Pulmonary function test; Future  Type 2 diabetes mellitus without complication, with long-term current use of insulin ([facility]) -     Glucose (CBG) -     HgB A1c  Aphthous ulcer  Migraine without status migrainosus, not intractable, unspecified migraine type -     rizatriptan (MAXALT) 10 MG tablet; Take 1 tablet (10 mg total) by mouth as needed for migraine. May repeat in 2 hours if needed; max 4 pills in 24 hours  Essential hypertension  Chronic pain syndrome  Schizoaffective disorder, bipolar type ([place])  Other orders -     Discontinue: sodium bicarbonate/sodium chloride SOLN; 1 application by Mouth Rinse route as needed for dry mouth or mouth pain. -  magic mouthwash w/lidocaine SOLN; Take 5 mLs by mouth 4 (four) times daily as needed for mouth pain.

## 2019-04-10 ENCOUNTER — Ambulatory Visit: Payer: Medicare Other | Attending: Critical Care Medicine | Admitting: Critical Care Medicine

## 2019-04-10 ENCOUNTER — Encounter: Payer: Self-pay | Admitting: Critical Care Medicine

## 2019-04-10 VITALS — BP 162/113 | HR 89 | Resp 16 | Wt 255.0 lb

## 2019-04-10 DIAGNOSIS — E119 Type 2 diabetes mellitus without complications: Secondary | ICD-10-CM

## 2019-04-10 DIAGNOSIS — Z794 Long term (current) use of insulin: Secondary | ICD-10-CM

## 2019-04-10 DIAGNOSIS — F250 Schizoaffective disorder, bipolar type: Secondary | ICD-10-CM

## 2019-04-10 DIAGNOSIS — K120 Recurrent oral aphthae: Secondary | ICD-10-CM | POA: Diagnosis not present

## 2019-04-10 DIAGNOSIS — J449 Chronic obstructive pulmonary disease, unspecified: Secondary | ICD-10-CM | POA: Diagnosis not present

## 2019-04-10 DIAGNOSIS — G43909 Migraine, unspecified, not intractable, without status migrainosus: Secondary | ICD-10-CM

## 2019-04-10 DIAGNOSIS — I10 Essential (primary) hypertension: Secondary | ICD-10-CM

## 2019-04-10 DIAGNOSIS — J4489 Other specified chronic obstructive pulmonary disease: Secondary | ICD-10-CM

## 2019-04-10 DIAGNOSIS — G894 Chronic pain syndrome: Secondary | ICD-10-CM

## 2019-04-10 LAB — GLUCOSE, POCT (MANUAL RESULT ENTRY): POC Glucose: 263 mg/dl — AB (ref 70–99)

## 2019-04-10 LAB — POCT GLYCOSYLATED HEMOGLOBIN (HGB A1C): HbA1c, POC (controlled diabetic range): 8.4 % — AB (ref 0.0–7.0)

## 2019-04-10 MED ORDER — MAGIC MOUTHWASH W/LIDOCAINE: 5.0000 mL | Freq: Four times a day (QID) | ORAL | Status: DC | PRN

## 2019-04-10 MED ORDER — RIZATRIPTAN BENZOATE 10 MG PO TABS: 10.0000 mg | ORAL_TABLET | ORAL | 2 refills | Status: DC | PRN

## 2019-04-10 MED ORDER — SODIUM BICARBONATE/SODIUM CHLORIDE MOUTHWASH: 1.0000 "application " | OROMUCOSAL | Status: DC | PRN

## 2019-04-10 NOTE — Assessment & Plan Note (Signed)
I did refill the Maxalt today for the patient

## 2019-04-10 NOTE — Patient Instructions (Signed)
Use flutter valve 3 times daily  Use AeroChamber with your inhalers  Mouthwash prescription was printed you need to take this to your pharmacy to see what they have on hand for your mouthwash  No change in inhaled medications at this time  Breathing test will be scheduled along with this she need to have a Covid test prior to the breathing test this will be scheduled as well  Return to see Dr. Joya Gaskins 2 months

## 2019-04-10 NOTE — Assessment & Plan Note (Signed)
The patient does have a psychiatrist but apparently has not been in for his Invega injection recently and he needs to follow-up with the psychiatry

## 2019-04-10 NOTE — Assessment & Plan Note (Signed)
Hypertension not under good control but currently on losartan HCT 100/25 we will continue this daily  The patient needs to pursue smoking cessation

## 2019-04-10 NOTE — Assessment & Plan Note (Signed)
Chronic pain syndrome  I had refilled tramadol with a count of 50 at the last visit in November the patient does not read refills at this time and we have placed a referral to pain medicine

## 2019-04-10 NOTE — Assessment & Plan Note (Signed)
COPD with recurrent exacerbation we will now order pulmonary function studies and ensure this is completed  No additional steroids or antibiotics indicated  We will continue Symbicort and albuterol as needed and obtain for the patient a spacer device for his HFA inhalers and a flutter valve

## 2019-04-10 NOTE — Assessment & Plan Note (Addendum)
No changes in the patient's current dosing of insulin Note hemoglobin A1c was 8.4 at this visit  Follow-up per primary care

## 2019-04-11 ENCOUNTER — Other Ambulatory Visit: Payer: Medicare Other

## 2019-04-13 ENCOUNTER — Other Ambulatory Visit (HOSPITAL_COMMUNITY)
Admission: RE | Admit: 2019-04-13 | Discharge: 2019-04-13 | Disposition: A | Discharge status: Home / Self Care | Payer: Medicare Other | Source: Ambulatory Visit | Attending: Critical Care Medicine | Admitting: Critical Care Medicine

## 2019-04-13 DIAGNOSIS — Z01812 Encounter for preprocedural laboratory examination: Secondary | ICD-10-CM | POA: Diagnosis present

## 2019-04-13 DIAGNOSIS — Z20828 Contact with and (suspected) exposure to other viral communicable diseases: Secondary | ICD-10-CM | POA: Diagnosis not present

## 2019-04-14 LAB — NOVEL CORONAVIRUS, NAA (HOSP ORDER, SEND-OUT TO REF LAB; TAT 18-24 HRS): SARS-CoV-2, NAA: NOT DETECTED

## 2019-04-17 ENCOUNTER — Ambulatory Visit: Payer: Medicare Other | Attending: Critical Care Medicine | Admitting: Critical Care Medicine

## 2019-04-17 DIAGNOSIS — R0602 Shortness of breath: Secondary | ICD-10-CM | POA: Insufficient documentation

## 2019-04-17 DIAGNOSIS — Z794 Long term (current) use of insulin: Secondary | ICD-10-CM | POA: Diagnosis not present

## 2019-04-17 DIAGNOSIS — J449 Chronic obstructive pulmonary disease, unspecified: Secondary | ICD-10-CM | POA: Diagnosis not present

## 2019-04-17 DIAGNOSIS — F17210 Nicotine dependence, cigarettes, uncomplicated: Secondary | ICD-10-CM | POA: Insufficient documentation

## 2019-04-17 DIAGNOSIS — J209 Acute bronchitis, unspecified: Secondary | ICD-10-CM | POA: Diagnosis not present

## 2019-04-17 DIAGNOSIS — G8929 Other chronic pain: Secondary | ICD-10-CM | POA: Insufficient documentation

## 2019-04-17 DIAGNOSIS — G894 Chronic pain syndrome: Secondary | ICD-10-CM

## 2019-04-17 DIAGNOSIS — J440 Chronic obstructive pulmonary disease with acute lower respiratory infection: Secondary | ICD-10-CM | POA: Diagnosis not present

## 2019-04-17 DIAGNOSIS — M545 Low back pain: Secondary | ICD-10-CM | POA: Diagnosis not present

## 2019-04-17 DIAGNOSIS — K120 Recurrent oral aphthae: Secondary | ICD-10-CM | POA: Diagnosis not present

## 2019-04-17 DIAGNOSIS — I10 Essential (primary) hypertension: Secondary | ICD-10-CM | POA: Diagnosis not present

## 2019-04-17 DIAGNOSIS — R05 Cough: Secondary | ICD-10-CM | POA: Insufficient documentation

## 2019-04-17 DIAGNOSIS — Z79899 Other long term (current) drug therapy: Secondary | ICD-10-CM | POA: Diagnosis not present

## 2019-04-17 MED ORDER — MAGIC MOUTHWASH W/LIDOCAINE: 5.0000 mL | Freq: Four times a day (QID) | ORAL | 0 refills | Status: DC | PRN

## 2019-04-17 MED ORDER — TRAMADOL HCL 50 MG PO TABS: 50.0000 mg | ORAL_TABLET | Freq: Four times a day (QID) | ORAL | 0 refills | Status: DC | PRN

## 2019-04-17 MED ORDER — LEVOFLOXACIN 500 MG PO TABS: 500.0000 mg | ORAL_TABLET | Freq: Every day | ORAL | 0 refills | Status: DC

## 2019-04-17 MED ORDER — PREDNISONE 10 MG PO TABS: ORAL_TABLET | ORAL | 0 refills | Status: DC

## 2019-04-17 MED ORDER — PROMETHAZINE-DM 6.25-15 MG/5ML PO SYRP: 5.0000 mL | ORAL_SOLUTION | Freq: Four times a day (QID) | ORAL | 0 refills | Status: DC | PRN

## 2019-04-17 NOTE — Progress Notes (Signed)
Subjective:    Patient ID: Joshua [name], male    DOB: April 03, 1956, 63 y.o.   MRN: [id number] Virtual Visit via Video Note  I connected with@ on 04/18/19 at 330PM by a video enabled telemedicine application and verified that I am speaking with the correct person using two identifiers.   Consent:  I discussed the limitations, risks, security and privacy concerns of performing an evaluation and management service by video visit and the availability of in person appointments. I also discussed with the patient that there may be a patient responsible charge related to this service. The patient expressed understanding and agreed to proceed.  Location of patient: The patient was at home  Location of provider: I was in the office  Persons participating in the televisit with the patient.   No one else on the visit    History of Present Illness: This is a video telehealth visit for a patient who has underlying COPD and was needing further follow-up upon referral from the patient's primary care provider.  The patient's been on several rounds of prednisone antibiotics with only improvement while on prednisone.  He states he is short of breath at rest and with exertion.  He has had problems with breathing for many years got worse over the past year.  He also has underlying diabetes on Trulicity and insulin long-acting.  He also states he has chronic low back pain and has difficulty ambulating this affects his breathing as well.  His weight is gone up as well which is aggravated his chronic pain.  He had a pain management doctor but has not seen that doctor since November 2019.  The patient is on tramadol from an urgent care visit he states this is helped to some degree.  He states nonsteroidals and gabapentin have not been of use in the past.  He also states that he has increased fluid retention and is questioning whether his losartan can be adjusted to accommodate reduction in fluid retention.  He  still smokes 2 pack a day of cigarettes and previous smoked as much as 4 packs a day.  He has not had a pulmonary function test.  Chest x-ray in July of this year showed no active disease.  The patient does have a Symbicort inhaler and is using this however only on an as needed basis along with albuterol which is as needed  Note when I tried to do a medication reconciliation the patient was not clear on which medicines he was taking and which ones he had stopped and he has a list of nearly 40 medicines in his E HR.  04/10/2019 This is a follow-up visit from a video visit that occurred recently.  The patient was given doxycycline and pulse prednisone for COPD exacerbation and is maintaining Symbicort daily.  The patient was referred for the COPD evaluation.  I also refill the patient's tramadol at the last visit.  He has pending pulmonary function test which have yet to be obtained.  He does state that his back is improved on the tramadol.  He also states that he has broken teeth and they will bite into his gum and mouth causing pain in the mouth.  The tramadol helps to some degree but is requesting a mouthwash.  He has not seen a dentist recently.  Patient is still smoking 2 packs a day of cigarettes.  The patient did bring all his current active medications in with him today and I did a complete medication  reconciliation see is current new medication list  04/17/2019 Video visit request re dyspnea Copd  This patient was last seen in person on December 15 and since that time the patient has had recurrent exacerbation of COPD.  He has been using his Symbicort daily.  He states he awoke with acute shortness of breath and lung congestion.  This occurred 2 days ago.  He also had increased pain in the right side.  He has been coughing up profuse thick yellow mucus.  He is using the flutter valve and the nebulizer which helps to remove some of the mucus.  His mouth pain is improved with the Dukes  mouthwash and would like a refill.  He is also out of his tramadol and is yet to achieve a chronic pain management appointment.  There is no fever.  Note he just had a negative Covid test December 18 in preparation for pulmonary function studies which have yet to be scheduled  Past Medical History:  Diagnosis Date  . Back pain   . Bipolar disorder ([place])   . Cannabis use disorder, moderate, dependence ([place])   . Chronic pain   . Diabetes mellitus without complication ([place])   . Generalized anxiety disorder   . Headache   . Hypertension   . Schizoaffective disorder ([place])      Family History  Problem Relation Age of Onset  . Diabetes Mother   . Cancer Mother        unsure what type  . Heart disease Mother   . Hypertension Mother   . Dementia Father   . Diabetes Brother   . Dementia Maternal Grandfather   . Cancer Paternal Grandmother 43       breast  . Mental illness Neg Hx      Social History   Socioeconomic History  . Marital status: Married    Spouse name: Not on file  . Number of children: Not on file  . Years of education: Not on file  . Highest education level: Not on file  Occupational History  . Not on file  Tobacco Use  . Smoking status: Current Every Day Smoker    Packs/day: 2.00    Types: Cigarettes  . Smokeless tobacco: Never Used  Substance and Sexual Activity  . Alcohol use: Not Currently  . Drug use: Yes    Types: Marijuana    Comment: "have changed to hemp now" 09/17/17  . Sexual activity: Not on file  Other Topics Concern  . Not on file  Social History Narrative  . Not on file   Social Determinants of Health   Financial Resource Strain:   . Difficulty of Paying Living Expenses: Not on file  Food Insecurity:   . Worried About Charity fundraiser in the Last Year: Not on file  . Ran Out of Food in the Last Year: Not on file  Transportation Needs:   . Lack of Transportation (Medical): Not on file  . Lack of Transportation (Non-Medical): Not  on file  Physical Activity:   . Days of Exercise per Week: Not on file  . Minutes of Exercise per Session: Not on file  Stress:   . Feeling of Stress : Not on file  Social Connections:   . Frequency of Communication with Friends and Family: Not on file  . Frequency of Social Gatherings with Friends and Family: Not on file  . Attends Religious Services: Not on file  . Active Member of Clubs or Organizations: Not  on file  . Attends Archivist Meetings: Not on file  . Marital Status: Not on file  Intimate Partner Violence:   . Fear of Current or Ex-Partner: Not on file  . Emotionally Abused: Not on file  . Physically Abused: Not on file  . Sexually Abused: Not on file     Allergies  Allergen Reactions  . Imitrex [Sumatriptan] Other (See Comments)    mania     Outpatient Medications Prior to Visit  Medication Sig Dispense Refill  . Accu-Chek FastClix Lancets MISC Use to check blood sugar daily. 102 each 11  . albuterol (PROVENTIL) (2.5 MG/3ML) 0.083% nebulizer solution Take 3 mLs (2.5 mg total) by nebulization every 6 (six) hours as needed for wheezing or shortness of breath. 75 mL 2  . Blood Glucose Monitoring Suppl (TRUE METRIX METER) w/Device KIT Check blood sugars fasting and at bedtime 1 kit 0  . budesonide-formoterol (SYMBICORT) 160-4.5 MCG/ACT inhaler Inhale 2 puffs into the lungs 2 (two) times daily. 1 Inhaler 3  . diclofenac (CATAFLAM) 50 MG tablet One pill twice daily as needed for pain; take after eating (Patient taking differently: Take 50 mg by mouth 2 (two) times daily as needed (pain). ) 60 tablet 4  . divalproex (DEPAKOTE ER) 500 MG 24 hr tablet Take 3 tablets (1,500 mg total) by mouth at bedtime. 90 tablet 11  . gabapentin (NEURONTIN) 300 MG capsule Take one pill twice per day and two at bedtime 120 capsule 5  . glucose blood (ACCU-CHEK GUIDE) test strip Use as instructed to check blood sugar three times daily 100 each 11  . Insulin Glargine (BASAGLAR  KWIKPEN) 100 UNIT/ML SOPN INJECT 30 UNITS INTO THE SKIN DAILY (Patient taking differently: 34 Units daily. ) 15 mL 2  . Insulin Pen Needle (TRUEPLUS PEN NEEDLES) 32G X 4 MM MISC Use as directed to inject insulin 100 each 11  . ipratropium (ATROVENT) 0.02 % nebulizer solution Take 2.5 mLs (0.5 mg total) by nebulization 4 (four) times daily. With or instead of the Albuterol 75 mL 0  . losartan-hydrochlorothiazide (HYZAAR) 100-25 MG tablet Take 1 tablet by mouth daily. 90 tablet 3  . methocarbamol (ROBAXIN) 500 MG tablet Take 1 tablet (500 mg total) by mouth every 8 (eight) hours as needed for muscle spasms. (Patient not taking: Reported on 04/10/2019) 90 tablet 5  . nicotine (NICODERM CQ - DOSED IN MG/24 HOURS) 21 mg/24hr patch Place 1 patch (21 mg total) onto the skin daily. 28 patch 0  . paliperidone (INVEGA SUSTENNA) 156 MG/ML SUSY injection Inject 1 mL (156 mg total) into the muscle once for 1 dose. Due 5/1 1 mL 11  . Respiratory Therapy Supplies (FLUTTER) DEVI Use with 4 times daily 1 each 0  . rizatriptan (MAXALT) 10 MG tablet Take 1 tablet (10 mg total) by mouth as needed for migraine. May repeat in 2 hours if needed; max 4 pills in 24 hours 10 tablet 2  . Spacer/Aero-Holding Chambers (AEROCHAMBER MV) inhaler Use as instructed 1 each 0  . TRULICITY 8.10 FB/5.1WC SOPN ADMINISTER 0.5 ML UNDER THE SKIN 1 TIME A WEEK 2 mL 2  . VENTOLIN HFA 108 (90 Base) MCG/ACT inhaler Inhale 2 puffs into the lungs every 4 (four) hours as needed. 18 g 11  . magic mouthwash w/lidocaine SOLN Take 5 mLs by mouth 4 (four) times daily as needed for mouth pain. 150 mL ml  . magic mouthwash w/lidocaine SOLN TAKE 5MLS BY MOUTH FOUR TIMES DAILY AS  NEEDED FOR MOUTH PAIN AS DIRECTED    . traMADol (ULTRAM) 50 MG tablet Take 1 tablet (50 mg total) by mouth every 6 (six) hours as needed. 50 tablet 0   No facility-administered medications prior to visit.    Review of Systems Constitutional:   No  weight loss, night sweats,   Fevers, chills, fatigue, lassitude. HEENT:    headaches,  Difficulty swallowing,  Tooth/dental problems,  Sore throat,                No sneezing, itching, ear ache, nasal congestion, post nasal drip,   CV:  No chest pain,  Orthopnea, PND, swelling in lower extremities, anasarca, dizziness, palpitations  GI  No heartburn, indigestion, abdominal pain, nausea, vomiting, diarrhea, change in bowel habits, loss of appetite  Resp:  shortness of breath with exertion or at rest.  No excess mucus,  productive cough,  No non-productive cough,  No coughing up of blood.  No change in color of mucus.  No wheezing.  No chest wall deformity  Skin: no rash or lesions.  GU: no dysuria, change in color of urine, no urgency or frequency.  No flank pain.  MS:  No joint pain or swelling.  No decreased range of motion.  No back pain.  Psych:  No change in mood or affect. No depression or anxiety.  No memory loss.      Objective:   Physical Exam   There were no vitals filed for this visit. There is no physical exam as this was a video visit and I was able to view the patient and he was not in any acute distress on the video last      Assessment & Plan:  I personally reviewed all images and lab data in the [facility] system as well as any outside material available during this office visit and agree with the  radiology impressions.   Acute bronchitis Chronic obstructive lung disease with acute exacerbation recurrent  Plan will be to continue Symbicort twice daily and give pulse prednisone along with a 5-day course of Levaquin  We will endeavor to get pulmonary functions in this patient stabilizes in January  Chronic pain syndrome This patient is yet to achieve a pain management clinic appointment  The patient has low back pain and also is having mouth pain from aphthous ulcer of mouth we will refill the Dukes mouthwash  I gave this patient another 50 count of tramadol to take 1 every 6 hours as needed  for pain.  I told the patient he need to sign a pain management contract when he comes back in the clinic in January and he is agreeable to this and also drug screen will be obtained  I reviewed the [place] drug database and there are no other providers prescribing tramadol other than myself recently except for the hospital visit in November   Diagnoses and all orders for this visit:  Acute bronchitis, unspecified organism  COPD with chronic bronchitis (HCC)  Chronic pain syndrome  Other orders -     magic mouthwash w/lidocaine SOLN; Take 5 mLs by mouth 4 (four) times daily as needed for mouth pain. -     predniSONE (DELTASONE) 10 MG tablet; Take 4 tablets daily for 5 days then stop -     levofloxacin (LEVAQUIN) 500 MG tablet; Take 1 tablet (500 mg total) by mouth daily. -     traMADol (ULTRAM) 50 MG tablet; Take 1 tablet (50 mg total) by mouth  every 6 (six) hours as needed. -     promethazine-dextromethorphan (PROMETHAZINE-DM) 6.25-15 MG/5ML syrup; Take 5 mLs by mouth 4 (four) times daily as needed for cough.   Follow Up Instructions: The patient knows a follow-up office exam will be obtained in January   I discussed the assessment and treatment plan with the patient. The patient was provided an opportunity to ask questions and all were answered. The patient agreed with the plan and demonstrated an understanding of the instructions.   The patient was advised to call back or seek an in-person evaluation if the symptoms worsen or if the condition fails to improve as anticipated.  I provided 30 minutes of non-face-to-face time during this encounter  including  median intraservice time , review of notes, labs, imaging, medications  and explaining diagnosis and management to the patient .    Asencion Noble, MD

## 2019-04-18 ENCOUNTER — Encounter: Payer: Self-pay | Admitting: Critical Care Medicine

## 2019-04-18 NOTE — Assessment & Plan Note (Signed)
Chronic obstructive lung disease with acute exacerbation recurrent  Plan will be to continue Symbicort twice daily and give pulse prednisone along with a 5-day course of Levaquin  We will endeavor to get pulmonary functions in this patient stabilizes in January

## 2019-04-18 NOTE — Assessment & Plan Note (Addendum)
This patient is yet to achieve a pain management clinic appointment  The patient has low back pain and also is having mouth pain from aphthous ulcer of mouth we will refill the Dukes mouthwash  I gave this patient another 50 count of tramadol to take 1 every 6 hours as needed for pain.  I told the patient he need to sign a pain management contract when he comes back in the clinic in January and he is agreeable to this and also drug screen will be obtained  I reviewed the [place] drug database and there are no other providers prescribing tramadol other than myself recently except for the hospital visit in November

## 2019-04-23 ENCOUNTER — Other Ambulatory Visit: Payer: Self-pay | Admitting: Family Medicine

## 2019-04-24 ENCOUNTER — Encounter (HOSPITAL_COMMUNITY): Payer: Medicare Other

## 2019-05-02 ENCOUNTER — Telehealth: Payer: Self-pay | Admitting: Family Medicine

## 2019-05-02 MED ORDER — MAGIC MOUTHWASH W/LIDOCAINE: 5.0000 mL | Freq: Four times a day (QID) | ORAL | 0 refills | Status: DC | PRN

## 2019-05-02 MED ORDER — PREDNISONE 10 MG PO TABS: ORAL_TABLET | ORAL | 0 refills | Status: DC

## 2019-05-02 MED ORDER — LEVOFLOXACIN 500 MG PO TABS: 500.0000 mg | ORAL_TABLET | Freq: Every day | ORAL | 0 refills | Status: DC

## 2019-05-02 MED ORDER — TRAMADOL HCL 50 MG PO TABS: 50.0000 mg | ORAL_TABLET | Freq: Four times a day (QID) | ORAL | 0 refills | Status: DC | PRN

## 2019-05-02 MED FILL — GLIMEPIRIDE 4 MG TABS: 4 | 90 days supply | Qty: 90 | Fill #0

## 2019-05-02 NOTE — Telephone Encounter (Signed)
Patient called requesting to speak with Dr. Joya Gaskins because he states that his breathing has not been getting better. Patient states that when he walks across the room and sits down he has a hard time catching his breath. Patient would like for Dr. Joya Gaskins to call in more antibiotic to see if his breathing improves. Patient also states that he would like a refill on his Tramadol until he can get in to pain management. He would also like a refill on the mouthwash. If medications are prescribed patient would like Rx sent to [facility] pharmacy.

## 2019-05-02 NOTE — Telephone Encounter (Signed)
Will refill tramadol x 1,  Sending levaquin for bronchitis and prednisone pulse.   The mouthwash would not go by e prescribe.  I will need to sign from [facility] tomorrow    Needs video visit next week

## 2019-05-10 ENCOUNTER — Telehealth: Payer: Self-pay | Admitting: Family Medicine

## 2019-05-10 ENCOUNTER — Telehealth: Payer: Self-pay | Admitting: *Deleted

## 2019-05-10 DIAGNOSIS — J9801 Acute bronchospasm: Secondary | ICD-10-CM

## 2019-05-10 MED ORDER — ALBUTEROL SULFATE (2.5 MG/3ML) 0.083% IN NEBU: 2.5000 mg | INHALATION_SOLUTION | Freq: Four times a day (QID) | RESPIRATORY_TRACT | 1 refills | Status: DC | PRN

## 2019-05-10 NOTE — Telephone Encounter (Signed)
Left message on voicemail to return call.   Needs f/u with Dr. Joya Gaskins next week.

## 2019-05-10 NOTE — Telephone Encounter (Signed)
Informed of next appt. May 14, 2019.   Out of insulin- Trulicity but unable to afford d/t new year. Can cheaper insulin be sent to pharmacy.  He has appt. On Monday January 18th to f/u with Dr. Joya Gaskins for pulmonary concerns.   Please advise.

## 2019-05-11 NOTE — Telephone Encounter (Signed)
We do not have samples to give to the patient. His deductible restarted and copays for prescriptions will continue to be high until he meets his deductible. Of note, he was able to obtain his Basaglar. Will address possible need for insulin titration during visit with Dr. Joya Gaskins Monday.

## 2019-05-11 NOTE — Telephone Encounter (Signed)
What is the actual patient phone call dialogue

## 2019-05-11 NOTE — Telephone Encounter (Signed)
Can you address this and send in alternate medication or find out is samples available short term for patient

## 2019-05-13 NOTE — Progress Notes (Signed)
Subjective:    Patient ID: Joshua [name], male    DOB: 10-23-1955, 64 y.o.   MRN: [id number] History of Present Illness: This is a video telehealth visit for a patient who has underlying COPD and was needing further follow-up upon referral from the patient's primary care provider.  The patient's been on several rounds of prednisone antibiotics with only improvement while on prednisone.  He states he is short of breath at rest and with exertion.  He has had problems with breathing for many years got worse over the past year.  He also has underlying diabetes on Trulicity and insulin long-acting.  He also states he has chronic low back pain and has difficulty ambulating this affects his breathing as well.  His weight is gone up as well which is aggravated his chronic pain.  He had a pain management doctor but has not seen that doctor since November 2019.  The patient is on tramadol from an urgent care visit he states this is helped to some degree.  He states nonsteroidals and gabapentin have not been of use in the past.  He also states that he has increased fluid retention and is questioning whether his losartan can be adjusted to accommodate reduction in fluid retention.  He still smokes 2 pack a day of cigarettes and previous smoked as much as 4 packs a day.  He has not had a pulmonary function test.  Chest x-ray in July of this year showed no active disease.  The patient does have a Symbicort inhaler and is using this however only on an as needed basis along with albuterol which is as needed  Note when I tried to do a medication reconciliation the patient was not clear on which medicines he was taking and which ones he had stopped and he has a list of nearly 40 medicines in his E HR.  04/10/2019 This is a follow-up visit from a video visit that occurred recently.  The patient was given doxycycline and pulse prednisone for COPD exacerbation and is maintaining Symbicort daily.  The patient was  referred for the COPD evaluation.  I also refill the patient's tramadol at the last visit.  He has pending pulmonary function test which have yet to be obtained.  He does state that his back is improved on the tramadol.  He also states that he has broken teeth and they will bite into his gum and mouth causing pain in the mouth.  The tramadol helps to some degree but is requesting a mouthwash.  He has not seen a dentist recently.  Patient is still smoking 2 packs a day of cigarettes.  The patient did bring all his current active medications in with him today and I did a complete medication reconciliation see is current new medication list  04/17/2019 Video visit request re dyspnea Copd  This patient was last seen in person on December 15 and since that time the patient has had recurrent exacerbation of COPD.  He has been using his Symbicort daily.  He states he awoke with acute shortness of breath and lung congestion.  This occurred 2 days ago.  He also had increased pain in the right side.  He has been coughing up profuse thick yellow mucus.  He is using the flutter valve and the nebulizer which helps to remove some of the mucus.  His mouth pain is improved with the [facility] mouthwash and would like a refill.  He is also out of his tramadol  and is yet to achieve a chronic pain management appointment.  There is no fever.  Note he just had a negative Covid test December 18 in preparation for pulmonary function studies which have yet to be scheduled  1/18: This is a follow-up from previous visit in December that was a video visit.  The patient did receive a round of Levaquin and prednisone and he improved to some degree.  He only has albuterol for his nebulizer and is about to run out.  The patient worsened since he ran out of prednisone and the antibiotic.  He still has a lot of phlegm that is difficult to raise.  He is not using his flutter valve every day.  He continues to smoke 1 pack a day of cigarettes  but previously was at 2 packs a day The patient notes pressure in the frontal and sinus area and he denies any ear pain  Note his GAD-7 score is 6 and PHQ-9 score is 9 today  He states he cannot afford the co-pay at this time of the year with his insurance.  He has run out of insulin for the past 10 days and would prefer to have his medications filled at our pharmacy  The patient also had a referral to [facility] pain center but they declined his case.  Patient's been taking 50 mg 3 times daily of the tramadol  Past Medical History:  Diagnosis Date  . Back pain   . Bipolar disorder ([place])   . Cannabis use disorder, moderate, dependence ([place])   . Chronic pain   . Diabetes mellitus without complication ([place])   . Generalized anxiety disorder   . Headache   . Hypertension   . Schizoaffective disorder ([place])      Family History  Problem Relation Age of Onset  . Diabetes Mother   . Cancer Mother        unsure what type  . Heart disease Mother   . Hypertension Mother   . Dementia Father   . Diabetes Brother   . Dementia Maternal Grandfather   . Cancer Paternal Grandmother 96       breast  . Mental illness Neg Hx      Social History   Socioeconomic History  . Marital status: Married    Spouse name: Not on file  . Number of children: Not on file  . Years of education: Not on file  . Highest education level: Not on file  Occupational History  . Not on file  Tobacco Use  . Smoking status: Current Every Day Smoker    Packs/day: 2.00    Types: Cigarettes  . Smokeless tobacco: Never Used  Substance and Sexual Activity  . Alcohol use: Not Currently  . Drug use: Yes    Types: Marijuana    Comment: "have changed to hemp now" 09/17/17  . Sexual activity: Not on file  Other Topics Concern  . Not on file  Social History Narrative  . Not on file   Social Determinants of Health   Financial Resource Strain:   . Difficulty of Paying Living Expenses: Not on file  Food Insecurity:    . Worried About Charity fundraiser in the Last Year: Not on file  . Ran Out of Food in the Last Year: Not on file  Transportation Needs:   . Lack of Transportation (Medical): Not on file  . Lack of Transportation (Non-Medical): Not on file  Physical Activity:   . Days of Exercise  per Week: Not on file  . Minutes of Exercise per Session: Not on file  Stress:   . Feeling of Stress : Not on file  Social Connections:   . Frequency of Communication with Friends and Family: Not on file  . Frequency of Social Gatherings with Friends and Family: Not on file  . Attends Religious Services: Not on file  . Active Member of Clubs or Organizations: Not on file  . Attends Archivist Meetings: Not on file  . Marital Status: Not on file  Intimate Partner Violence:   . Fear of Current or Ex-Partner: Not on file  . Emotionally Abused: Not on file  . Physically Abused: Not on file  . Sexually Abused: Not on file     Allergies  Allergen Reactions  . Imitrex [Sumatriptan] Other (See Comments)    mania     Outpatient Medications Prior to Visit  Medication Sig Dispense Refill  . albuterol (PROVENTIL) (2.5 MG/3ML) 0.083% nebulizer solution Take 3 mLs (2.5 mg total) by nebulization every 6 (six) hours as needed for wheezing or shortness of breath. 75 mL 1  . divalproex (DEPAKOTE ER) 500 MG 24 hr tablet Take 3 tablets (1,500 mg total) by mouth at bedtime. 90 tablet 11  . losartan-hydrochlorothiazide (HYZAAR) 100-25 MG tablet Take 1 tablet by mouth daily. 90 tablet 3  . magic mouthwash w/lidocaine SOLN Take 5 mLs by mouth 4 (four) times daily as needed for mouth pain. 240 mL 0  . VENTOLIN HFA 108 (90 Base) MCG/ACT inhaler Inhale 2 puffs into the lungs every 4 (four) hours as needed. 18 g 11  . Accu-Chek FastClix Lancets MISC Use to check blood sugar daily. 102 each 11  . Blood Glucose Monitoring Suppl (TRUE METRIX METER) w/Device KIT Check blood sugars fasting and at bedtime 1 kit 0  .  budesonide-formoterol (SYMBICORT) 160-4.5 MCG/ACT inhaler Inhale 2 puffs into the lungs 2 (two) times daily. 1 Inhaler 3  . diclofenac (CATAFLAM) 50 MG tablet One pill twice daily as needed for pain; take after eating (Patient taking differently: Take 50 mg by mouth 2 (two) times daily as needed (pain). ) 60 tablet 4  . glucose blood (ACCU-CHEK GUIDE) test strip Use as instructed to check blood sugar three times daily 100 each 11  . Insulin Glargine (BASAGLAR KWIKPEN) 100 UNIT/ML SOPN INJECT 30 UNITS INTO THE SKIN DAILY (Patient taking differently: 34 Units daily. ) 15 mL 2  . Insulin Pen Needle (TRUEPLUS PEN NEEDLES) 32G X 4 MM MISC Use as directed to inject insulin 100 each 11  . ipratropium (ATROVENT) 0.02 % nebulizer solution Take 2.5 mLs (0.5 mg total) by nebulization 4 (four) times daily. With or instead of the Albuterol 75 mL 0  . traMADol (ULTRAM) 50 MG tablet Take 1 tablet (50 mg total) by mouth every 6 (six) hours as needed. 50 tablet 0  . TRULICITY 1.61 WR/6.0AV SOPN ADMINISTER 0.5 ML UNDER THE SKIN 1 TIME A WEEK 2 mL 2  . gabapentin (NEURONTIN) 300 MG capsule Take one pill twice per day and two at bedtime 120 capsule 5  . levofloxacin (LEVAQUIN) 500 MG tablet Take 1 tablet (500 mg total) by mouth daily. (Patient not taking: Reported on 05/14/2019) 5 tablet 0  . paliperidone (INVEGA SUSTENNA) 156 MG/ML SUSY injection Inject 1 mL (156 mg total) into the muscle once for 1 dose. Due 5/1 1 mL 11  . Respiratory Therapy Supplies (FLUTTER) DEVI Use with 4 times daily 1 each  0  . Spacer/Aero-Holding Chambers (AEROCHAMBER MV) inhaler Use as instructed 1 each 0  . traZODone (DESYREL) 100 MG tablet Take 200 mg by mouth at bedtime as needed.    . methocarbamol (ROBAXIN) 500 MG tablet Take 1 tablet (500 mg total) by mouth every 8 (eight) hours as needed for muscle spasms. (Patient not taking: Reported on 04/10/2019) 90 tablet 5  . nicotine (NICODERM CQ - DOSED IN MG/24 HOURS) 21 mg/24hr patch Place 1  patch (21 mg total) onto the skin daily. (Patient not taking: Reported on 05/14/2019) 28 patch 0  . predniSONE (DELTASONE) 10 MG tablet Take 4 tablets daily for 5 days then stop (Patient not taking: Reported on 05/14/2019) 20 tablet 0  . promethazine-dextromethorphan (PROMETHAZINE-DM) 6.25-15 MG/5ML syrup Take 5 mLs by mouth 4 (four) times daily as needed for cough. (Patient not taking: Reported on 05/14/2019) 240 mL 0  . rizatriptan (MAXALT) 10 MG tablet Take 1 tablet (10 mg total) by mouth as needed for migraine. May repeat in 2 hours if needed; max 4 pills in 24 hours (Patient not taking: Reported on 05/14/2019) 10 tablet 2   No facility-administered medications prior to visit.    Review of Systems Constitutional:   No  weight loss, night sweats,  Fevers, chills, fatigue, lassitude. HEENT:    headaches,  Difficulty swallowing,  Tooth/dental problems,  Sore throat,                No sneezing, itching, ear ache, nasal congestion, post nasal drip,   CV:  No chest pain,  Orthopnea, PND, swelling in lower extremities, anasarca, dizziness, palpitations  GI  No heartburn, indigestion, abdominal pain, nausea, vomiting, diarrhea, change in bowel habits, loss of appetite  Resp:  shortness of breath with exertion or at rest.  No excess mucus,  productive cough,  No non-productive cough,  No coughing up of blood.  No change in color of mucus.  No wheezing.  No chest wall deformity  Skin: no rash or lesions.  GU: no dysuria, change in color of urine, no urgency or frequency.  No flank pain.  MS:   joint pain or swelling.  No decreased range of motion.   back pain.  Psych:  No change in mood or affect. No depression or anxiety.  No memory loss.      Objective:   Physical Exam   Vitals:   05/14/19 1006  BP: (!) 142/93  Pulse: 90  Resp: 18  Temp: 98.8 F (37.1 C)  SpO2: 96%  Weight: 248 lb (112.5 kg)  Height: 5' 9.69" (1.77 m)   Vitals:   05/14/19 1006  BP: (!) 142/93  Pulse: 90  Resp:  18  Temp: 98.8 F (37.1 C)  SpO2: 96%  Weight: 248 lb (112.5 kg)  Height: 5' 9.69" (1.77 m)    Gen: Pleasant, well-nourished, in no distress,  normal affect  ENT: No lesions,  mouth clear,  oropharynx clear, no postnasal drip  Neck: No JVD, no TMG, no carotid bruits  Lungs: No use of accessory muscles, no dullness to percussion, there is distant breath sounds with expired wheezes right lower lung zone  Cardiovascular: RRR, heart sounds normal, no murmur or gallops, no peripheral edema  Abdomen: soft and NT, no HSM,  BS normal  Musculoskeletal: No deformities, no cyanosis or clubbing, lower back is tender in the paraspinal area and lumbar region  Neuro: alert, non focal  Skin: Warm, no lesions or rashes   Opioid Risk  05/14/2019  Alcohol 0  Illegal Drugs 0  Rx Drugs 4  Alcohol 0  Illegal Drugs 0  Rx Drugs 0  Age between 16-45 years  0  History of Preadolescent Sexual Abuse 0  Psychological Disease 2  Opioid Risk Tool Scoring 6  Opioid Risk Interpretation Moderate Risk   CBG 381     Assessment & Plan:  I personally reviewed all images and lab data in the [facility] system as well as any outside material available during this office visit and agree with the  radiology impressions.   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  Patient is well managed on tramadol 50 mg 3 times daily and I will go ahead and issue another 50 tablets today and have the patient sign a chronic pain management contract we will also  obtain a urine drug screen at this visit  I checked the [place] controlled substance database and the patient was negative for other prescribers of opiates or other schedule II medications  The patient was declined to be seen at the [facility] pain management clinic  Chronic low back pain with left-sided sciatica See chronic pain assessment  Type 2 diabetes mellitus with hyperglycemia ([place]) Type 2 diabetes with hyperglycemia  The patient's not been keeping insulin filled because of cost constraints  Will transition the patient's prescriptions to our pharmacy and the patient will continue Basaglar 35 units daily and Trulicity at a dose of 75 mcg weekly  I also gave the patient a prescription for a new glucose testing kit and supplies  Schizoaffective disorder, bipolar type ([place]) The patient continues with his mental health follow-up utilizing Invega and Depakote  HTN (hypertension) Hypertension not on great control but will continue current dose of losartan HCT daily  Tobacco use I spent about 5 minutes time at this visit discussing need for smoking cessation note patient previously had been given a prescription for nicotine but did not follow through on this     . Current smoking consumption amount: 1PPD  . Dicsussion on advise to quit smoking and smoking impacts:  Cardio vascular and lung disease impact  . Patient's willingness to quit:  Not engaged to quit at this time   . Methods to quit smoking discussed:  Failed nicotine replacement  . Medication management of smoking session drugs discussed: to follow up with mental health provider   . Setting quit date  Not yet arranged   Time spent counseling the patient:  5 min     Manson was seen today for follow-up.  Diagnoses and all orders for this visit:  Chronic obstructive pulmonary disease, unspecified COPD type ([place]) -     budesonide-formoterol (SYMBICORT) 160-4.5 MCG/ACT inhaler; Inhale 2 puffs into the  lungs 2 (two) times daily.  Schizoaffective disorder, bipolar type ([place])  Cannabis use disorder, severe, dependence ([place]) -     Cancel: Drug Screen, Urine  Type 2 diabetes mellitus with hyperglycemia, unspecified whether long term insulin use ([facility]) -     Insulin Pen Needle (TRUEPLUS PEN NEEDLES) 32G X  4 MM MISC; Use as directed to inject insulin -     Discontinue: Blood Glucose Monitoring Suppl (TRUE METRIX METER) w/Device KIT; Check blood sugars fasting and at bedtime -     Blood Glucose Monitoring Suppl (TRUE METRIX METER) w/Device KIT; Check blood sugars fasting and at bedtime -     Glucose (CBG)  Peripheral vascular disease due to secondary diabetes (HCC)  Thrombocytopenia (H[facility])  Type 2 diabetes mellitus with hyperglycemia, unspecified whether long term insulin use ([facility]) -     Insulin Pen Needle (TRUEPLUS PEN NEEDLES) 32G X 4 MM MISC; Use as directed to inject insulin -     Discontinue: Blood Glucose Monitoring Suppl (TRUE METRIX METER) w/Device KIT; Check blood sugars fasting and at bedtime -     Blood Glucose Monitoring Suppl (TRUE METRIX METER) w/Device KIT; Check blood sugars fasting and at bedtime -     Glucose (CBG)  Chronic low back pain with left-sided sciatica, unspecified back pain laterality -     Drug Screen, Urine  Acute bronchitis, unspecified organism  Chronic pain syndrome  Essential hypertension  Tobacco use  Other orders -     Insulin Glargine (BASAGLAR KWIKPEN) 100 UNIT/ML SOPN; Inject 0.35 mLs (35 Units total) into the skin daily. -     ipratropium (ATROVENT) 0.02 % nebulizer solution; Take 2.5 mLs (0.5 mg total) by nebulization 4 (four) times daily. -     traMADol (ULTRAM) 50 MG tablet; Take 1 tablet (50 mg total) by mouth every 6 (six) hours as needed. -     Dulaglutide (TRULICITY) 2.76 BO/4.8TT SOPN; ADMINISTER 0.5 ML UNDER THE SKIN 1 TIME A WEEK -     amoxicillin-clavulanate (AUGMENTIN) 875-125 MG tablet; Take 1 tablet by mouth 2 (two) times  daily. -     TRUEplus Lancets 28G MISC; Use to measure blood sugar twice a day -     glucose blood (TRUE METRIX BLOOD GLUCOSE TEST) test strip; Use as instructed  The patient will need a hepatitis C screen along with foot exam and pneumococcal vaccine note the patient declined a flu shot

## 2019-05-14 ENCOUNTER — Ambulatory Visit: Payer: Medicare Other | Attending: Critical Care Medicine | Admitting: Critical Care Medicine

## 2019-05-14 ENCOUNTER — Telehealth: Payer: Self-pay

## 2019-05-14 ENCOUNTER — Encounter: Payer: Self-pay | Admitting: Critical Care Medicine

## 2019-05-14 ENCOUNTER — Other Ambulatory Visit: Payer: Self-pay

## 2019-05-14 VITALS — BP 142/93 | HR 90 | Temp 98.8°F | Resp 18 | Ht 69.69 in | Wt 248.0 lb

## 2019-05-14 DIAGNOSIS — J449 Chronic obstructive pulmonary disease, unspecified: Secondary | ICD-10-CM

## 2019-05-14 DIAGNOSIS — J209 Acute bronchitis, unspecified: Secondary | ICD-10-CM | POA: Diagnosis not present

## 2019-05-14 DIAGNOSIS — Z79899 Other long term (current) drug therapy: Secondary | ICD-10-CM | POA: Insufficient documentation

## 2019-05-14 DIAGNOSIS — G8929 Other chronic pain: Secondary | ICD-10-CM | POA: Insufficient documentation

## 2019-05-14 DIAGNOSIS — J329 Chronic sinusitis, unspecified: Secondary | ICD-10-CM | POA: Diagnosis not present

## 2019-05-14 DIAGNOSIS — E1165 Type 2 diabetes mellitus with hyperglycemia: Secondary | ICD-10-CM | POA: Diagnosis not present

## 2019-05-14 DIAGNOSIS — Z833 Family history of diabetes mellitus: Secondary | ICD-10-CM | POA: Diagnosis not present

## 2019-05-14 DIAGNOSIS — F250 Schizoaffective disorder, bipolar type: Secondary | ICD-10-CM | POA: Diagnosis not present

## 2019-05-14 DIAGNOSIS — E1151 Type 2 diabetes mellitus with diabetic peripheral angiopathy without gangrene: Secondary | ICD-10-CM | POA: Diagnosis not present

## 2019-05-14 DIAGNOSIS — Z794 Long term (current) use of insulin: Secondary | ICD-10-CM | POA: Diagnosis not present

## 2019-05-14 DIAGNOSIS — Z720 Tobacco use: Secondary | ICD-10-CM | POA: Insufficient documentation

## 2019-05-14 DIAGNOSIS — M5416 Radiculopathy, lumbar region: Secondary | ICD-10-CM | POA: Diagnosis not present

## 2019-05-14 DIAGNOSIS — Z7951 Long term (current) use of inhaled steroids: Secondary | ICD-10-CM | POA: Insufficient documentation

## 2019-05-14 DIAGNOSIS — J440 Chronic obstructive pulmonary disease with acute lower respiratory infection: Secondary | ICD-10-CM | POA: Insufficient documentation

## 2019-05-14 DIAGNOSIS — M5442 Lumbago with sciatica, left side: Secondary | ICD-10-CM | POA: Diagnosis not present

## 2019-05-14 DIAGNOSIS — D696 Thrombocytopenia, unspecified: Secondary | ICD-10-CM | POA: Insufficient documentation

## 2019-05-14 DIAGNOSIS — G894 Chronic pain syndrome: Secondary | ICD-10-CM | POA: Diagnosis not present

## 2019-05-14 DIAGNOSIS — Z8249 Family history of ischemic heart disease and other diseases of the circulatory system: Secondary | ICD-10-CM | POA: Insufficient documentation

## 2019-05-14 DIAGNOSIS — F17210 Nicotine dependence, cigarettes, uncomplicated: Secondary | ICD-10-CM

## 2019-05-14 DIAGNOSIS — Z888 Allergy status to other drugs, medicaments and biological substances status: Secondary | ICD-10-CM | POA: Insufficient documentation

## 2019-05-14 DIAGNOSIS — I10 Essential (primary) hypertension: Secondary | ICD-10-CM | POA: Insufficient documentation

## 2019-05-14 DIAGNOSIS — F1220 Cannabis dependence, uncomplicated: Secondary | ICD-10-CM | POA: Diagnosis not present

## 2019-05-14 DIAGNOSIS — E1351 Other specified diabetes mellitus with diabetic peripheral angiopathy without gangrene: Secondary | ICD-10-CM | POA: Insufficient documentation

## 2019-05-14 LAB — GLUCOSE, POCT (MANUAL RESULT ENTRY): POC Glucose: 381 mg/dl — AB (ref 70–99)

## 2019-05-14 MED ORDER — AMOXICILLIN-POT CLAVULANATE 875-125 MG PO TABS: 1.0000 | ORAL_TABLET | Freq: Two times a day (BID) | ORAL | 0 refills | Status: DC

## 2019-05-14 MED ORDER — TRUE METRIX METER W/DEVICE KIT: PACK | 0 refills | Status: DC

## 2019-05-14 MED ORDER — TRUEPLUS LANCETS 28G MISC: 1 refills | Status: DC

## 2019-05-14 MED ORDER — TRULICITY 0.75 MG/0.5ML ~~LOC~~ SOAJ: SUBCUTANEOUS | 2 refills | Status: DC

## 2019-05-14 MED ORDER — TRUE METRIX BLOOD GLUCOSE TEST VI STRP: ORAL_STRIP | 12 refills | Status: DC

## 2019-05-14 MED ORDER — TRUEPLUS PEN NEEDLES 32G X 4 MM MISC: 11 refills | Status: DC

## 2019-05-14 MED ORDER — TRAMADOL HCL 50 MG PO TABS: 50.0000 mg | ORAL_TABLET | Freq: Four times a day (QID) | ORAL | 0 refills | Status: DC | PRN

## 2019-05-14 MED ORDER — IPRATROPIUM BROMIDE 0.02 % IN SOLN: 0.5000 mg | Freq: Four times a day (QID) | RESPIRATORY_TRACT | 2 refills | Status: DC

## 2019-05-14 MED ORDER — BUDESONIDE-FORMOTEROL FUMARATE 160-4.5 MCG/ACT IN AERO: 2.0000 | INHALATION_SPRAY | Freq: Two times a day (BID) | RESPIRATORY_TRACT | 3 refills | Status: DC

## 2019-05-14 MED ORDER — BASAGLAR KWIKPEN 100 UNIT/ML ~~LOC~~ SOPN: 35.0000 [IU] | PEN_INJECTOR | Freq: Every day | SUBCUTANEOUS | 2 refills | Status: DC

## 2019-05-14 MED FILL — traMADol HCL 50 MG TABS: 50 | 12 days supply | Qty: 50 | Fill #0

## 2019-05-14 MED FILL — BASAGLAR 100 UNIT/ML KWIKPE: 100 | 25 days supply | Qty: 9 | Fill #0

## 2019-05-14 MED FILL — TRUEPLUS PEN NDL 32GX5/32: 32G X 4 MM | 100 days supply | Qty: 100 | Fill #0

## 2019-05-14 MED FILL — AMOX-CLAV 875-125 MG TABLET: 875-125 | 10 days supply | Qty: 20 | Fill #0

## 2019-05-14 MED FILL — TRUEPLUS PEN NDL 32GX5/32": 32G X 4 MM | 100 days supply | Qty: 100 | Fill #0

## 2019-05-14 NOTE — Progress Notes (Signed)
Lower back pain relieved with prescribed meds. SOB when long walks  CBG 381

## 2019-05-14 NOTE — Assessment & Plan Note (Addendum)
The patient continues with his mental health follow-up utilizing Invega and Depakote

## 2019-05-14 NOTE — Patient Instructions (Signed)
Refills on your medications were sent to our pharmacy  Begin Augmentin 1 twice daily for 10 days for sinusitis  Begin Atrovent ipratropium and nebulizer 4 times daily and stay on Symbicort twice daily  Focus on smoking cessation or reduction in tobacco use  We had you sign a pain management contract today and I gave you refills on the tramadol   Return to see Dr. Joya Gaskins in 6 weeks and keep follow-up visits with your primary care provider

## 2019-05-14 NOTE — Telephone Encounter (Signed)
Patient was seen at [facility] for f/u with Dr Joya Gaskins. [facility] Patient also seeing if he can qualify for discount with insurance at [facility].

## 2019-05-14 NOTE — Assessment & Plan Note (Signed)
Hypertension not on great control but will continue current dose of losartan HCT daily

## 2019-05-14 NOTE — Assessment & Plan Note (Signed)
See chronic pain assessment

## 2019-05-14 NOTE — Telephone Encounter (Signed)
Call placed to [facility]/[place]. Spoke to John/Pharmacist regarding the order for glucometer /test strips. Jenny Reichmann will fax CMN to this clinic for provider to sign. He explained that the meter and supplies are covered by medicare part B.  He is not sure of the co-pay/ but stated that it might be about $3.33/month/   This information was then shared with the patient.

## 2019-05-14 NOTE — Assessment & Plan Note (Signed)
I spent about 5 minutes time at this visit discussing need for smoking cessation note patient previously had been given a prescription for nicotine but did not follow through on this     . Current smoking consumption amount: 1PPD  . Dicsussion on advise to quit smoking and smoking impacts:  Cardio vascular and lung disease impact  . Patient's willingness to quit:  Not engaged to quit at this time   . Methods to quit smoking discussed:  Failed nicotine replacement  . Medication management of smoking session drugs discussed: to follow up with mental health provider   . Setting quit date  Not yet arranged   Time spent counseling the patient:  5 min

## 2019-05-14 NOTE — Assessment & Plan Note (Signed)
Chronic pain syndrome with associated lumbar radiculopathy  Patient is well managed on tramadol 50 mg 3 times daily and I will go ahead and issue another 50 tablets today and have the patient sign a chronic pain management contract we will also obtain a urine drug screen at this visit  I checked the [place] controlled substance database and the patient was negative for other prescribers of opiates or other schedule II medications  The patient was declined to be seen at the [facility] pain management clinic

## 2019-05-14 NOTE — Assessment & Plan Note (Addendum)
Acute bronchitis with chronic obstructive lung disease reexacerbation and associated sinusitis  Plan to be for the patient to begin Augmentin 875 1 twice daily for 10 days  We will also prescribe ipratropium by nebulizer 4 times daily to go with the Symbicort twice daily 2 puffs  The patient also was counseled to use his flutter valve with each nebulizer treatment and at least 2 5-6 repetitions each time

## 2019-05-14 NOTE — Telephone Encounter (Signed)
His Trulicity is not actually insulin. Does he have his actual insulin? He may want to contact [place] and see if he is eligible for an assistance program through the manufacturer

## 2019-05-14 NOTE — Assessment & Plan Note (Signed)
Type 2 diabetes with hyperglycemia  The patient's not been keeping insulin filled because of cost constraints  Will transition the patient's prescriptions to our pharmacy and the patient will continue Basaglar 35 units daily and Trulicity at a dose of 75 mcg weekly  I also gave the patient a prescription for a new glucose testing kit and supplies

## 2019-05-15 LAB — DRUG SCREEN, URINE
Amphetamines, Urine: NEGATIVE ng/mL
Barbiturate screen, urine: NEGATIVE ng/mL
Benzodiazepine Quant, Ur: NEGATIVE ng/mL
Cannabinoid Quant, Ur: POSITIVE ng/mL — AB
Cocaine (Metab.): NEGATIVE ng/mL
Opiate Quant, Ur: NEGATIVE ng/mL
PCP Quant, Ur: NEGATIVE ng/mL

## 2019-05-18 ENCOUNTER — Telehealth: Payer: Self-pay

## 2019-05-18 NOTE — Telephone Encounter (Signed)
Call placed to [facility]/[place] to check on status of CMN for glucometer. Spoke to Kathy/Pharmacy who said that she would refax the CM[facility] to [facility]

## 2019-05-28 ENCOUNTER — Telehealth: Payer: Self-pay | Admitting: Family Medicine

## 2019-05-28 ENCOUNTER — Other Ambulatory Visit: Payer: Self-pay | Admitting: Critical Care Medicine

## 2019-05-28 DIAGNOSIS — M5442 Lumbago with sciatica, left side: Secondary | ICD-10-CM

## 2019-05-28 DIAGNOSIS — G8929 Other chronic pain: Secondary | ICD-10-CM

## 2019-05-28 NOTE — Telephone Encounter (Signed)
Patient wants to know how they know that they are getting better from all the medication Dr Joya Gaskins put them on. [phone] is the call the call back

## 2019-05-28 NOTE — Telephone Encounter (Signed)
Pt called requesting a refill on his tramadol and gabapentin.

## 2019-05-29 ENCOUNTER — Ambulatory Visit: Payer: Medicare Other | Admitting: Critical Care Medicine

## 2019-05-29 MED ORDER — GABAPENTIN 300 MG PO CAPS: ORAL_CAPSULE | ORAL | 5 refills | Status: DC

## 2019-05-29 MED FILL — traMADol HCL 50 MG TABS: 50 | 12 days supply | Qty: 50 | Fill #0

## 2019-05-29 NOTE — Telephone Encounter (Signed)
pls get ms banda an in office exam appt with any PCP here to establish in March.

## 2019-05-29 NOTE — Telephone Encounter (Signed)
I have sent refills on gabapentin and tramadol to our pharmacy, pls let the patient know

## 2019-05-29 NOTE — Telephone Encounter (Signed)
Patient is calling requesting a refill on Tramadol. Please f/u

## 2019-05-30 MED FILL — GABAPENTIN 300 MG CAPSULE: 300 | 30 days supply | Qty: 120 | Fill #0

## 2019-06-01 NOTE — Telephone Encounter (Signed)
Able to contact patient. Name and DOB verified. Patient aware that medications were sent to pharmacy.

## 2019-06-04 DIAGNOSIS — F250 Schizoaffective disorder, bipolar type: Secondary | ICD-10-CM | POA: Diagnosis not present

## 2019-06-11 ENCOUNTER — Other Ambulatory Visit: Payer: Self-pay | Admitting: Critical Care Medicine

## 2019-06-11 NOTE — Telephone Encounter (Signed)
1) Medication(s) Requested (by name): -traMADol (ULTRAM) 50 MG tablet   2) Pharmacy of Choice: [facility] pharmacy

## 2019-06-12 ENCOUNTER — Ambulatory Visit: Payer: Medicare Other | Admitting: Critical Care Medicine

## 2019-06-12 MED FILL — traMADol HCL 50 MG TABS: 50 | 12 days supply | Qty: 50 | Fill #0

## 2019-06-12 MED FILL — GABAPENTIN 300 MG CAPSULE: 300 | 30 days supply | Qty: 120 | Fill #0

## 2019-06-25 ENCOUNTER — Telehealth: Payer: Self-pay | Admitting: Family Medicine

## 2019-06-25 NOTE — Telephone Encounter (Signed)
Patient called requesting a refill on Tramadol. Please f/u

## 2019-06-26 MED ORDER — TRAMADOL HCL 50 MG PO TABS: 50.0000 mg | ORAL_TABLET | Freq: Four times a day (QID) | ORAL | 0 refills | Status: DC | PRN

## 2019-06-26 MED FILL — traMADol HCL 50 MG TABS: 50 | 12 days supply | Qty: 50 | Fill #0

## 2019-06-26 NOTE — Telephone Encounter (Signed)
I sent tramadol refill to our pharmacy.  He has a pain contract in place

## 2019-06-28 ENCOUNTER — Other Ambulatory Visit: Payer: Self-pay

## 2019-06-28 ENCOUNTER — Encounter: Payer: Self-pay | Admitting: Critical Care Medicine

## 2019-06-28 ENCOUNTER — Ambulatory Visit: Payer: Medicare Other | Attending: Critical Care Medicine | Admitting: Critical Care Medicine

## 2019-06-28 DIAGNOSIS — I10 Essential (primary) hypertension: Secondary | ICD-10-CM | POA: Diagnosis not present

## 2019-06-28 DIAGNOSIS — F17210 Nicotine dependence, cigarettes, uncomplicated: Secondary | ICD-10-CM | POA: Diagnosis not present

## 2019-06-28 DIAGNOSIS — E1165 Type 2 diabetes mellitus with hyperglycemia: Secondary | ICD-10-CM | POA: Diagnosis not present

## 2019-06-28 DIAGNOSIS — G43009 Migraine without aura, not intractable, without status migrainosus: Secondary | ICD-10-CM | POA: Diagnosis not present

## 2019-06-28 DIAGNOSIS — Z79899 Other long term (current) drug therapy: Secondary | ICD-10-CM | POA: Diagnosis not present

## 2019-06-28 DIAGNOSIS — G894 Chronic pain syndrome: Secondary | ICD-10-CM | POA: Diagnosis not present

## 2019-06-28 DIAGNOSIS — F411 Generalized anxiety disorder: Secondary | ICD-10-CM | POA: Insufficient documentation

## 2019-06-28 DIAGNOSIS — Z720 Tobacco use: Secondary | ICD-10-CM | POA: Diagnosis not present

## 2019-06-28 DIAGNOSIS — Z833 Family history of diabetes mellitus: Secondary | ICD-10-CM | POA: Insufficient documentation

## 2019-06-28 DIAGNOSIS — Z888 Allergy status to other drugs, medicaments and biological substances status: Secondary | ICD-10-CM | POA: Insufficient documentation

## 2019-06-28 DIAGNOSIS — J449 Chronic obstructive pulmonary disease, unspecified: Secondary | ICD-10-CM | POA: Diagnosis present

## 2019-06-28 DIAGNOSIS — Z8249 Family history of ischemic heart disease and other diseases of the circulatory system: Secondary | ICD-10-CM | POA: Insufficient documentation

## 2019-06-28 DIAGNOSIS — Z794 Long term (current) use of insulin: Secondary | ICD-10-CM | POA: Insufficient documentation

## 2019-06-28 DIAGNOSIS — Z7951 Long term (current) use of inhaled steroids: Secondary | ICD-10-CM | POA: Diagnosis not present

## 2019-06-28 DIAGNOSIS — F250 Schizoaffective disorder, bipolar type: Secondary | ICD-10-CM | POA: Diagnosis not present

## 2019-06-28 MED ORDER — RIZATRIPTAN BENZOATE 10 MG PO TABS: 10.0000 mg | ORAL_TABLET | ORAL | 0 refills | Status: DC | PRN

## 2019-06-28 NOTE — Progress Notes (Signed)
Patient has been called and DOB has been verified. Patient has been screened and transferred to PCP to start phone visit.     

## 2019-06-28 NOTE — Progress Notes (Signed)
Subjective:    Patient ID: Joshua [name], male    DOB: [birth year], 63 y.o.   MRN: [id number]  Virtual Visit via Telephone Note  I connected with Joshua [name] on 06/29/19 at  2:00 PM EST by telephone and verified that I am speaking with the correct person using two identifiers.   Consent:  I discussed the limitations, risks, security and privacy concerns of performing an evaluation and management service by telephone and the availability of in person appointments. I also discussed with the patient that there may be a patient responsible charge related to this service. The patient expressed understanding and agreed to proceed.  Location of patient: The patient was at home  Location of provider: I was in my office  Persons participating in the televisit with the patient.  No one else on the call    History of Present Illness: This is a video telehealth visit for a patient who has underlying COPD and was needing further follow-up upon referral from the patient's primary care provider.  The patient's been on several rounds of prednisone antibiotics with only improvement while on prednisone.  He states he is short of breath at rest and with exertion.  He has had problems with breathing for many years got worse over the past year.  He also has underlying diabetes on Trulicity and insulin long-acting.  He also states he has chronic low back pain and has difficulty ambulating this affects his breathing as well.  His weight is gone up as well which is aggravated his chronic pain.  He had a pain management doctor but has not seen that doctor since November 2019.  The patient is on tramadol from an urgent care visit he states this is helped to some degree.  He states nonsteroidals and gabapentin have not been of use in the past.  He also states that he has increased fluid retention and is questioning whether his losartan can be adjusted to accommodate reduction in fluid retention.  He still  smokes 2 pack a day of cigarettes and previous smoked as much as 4 packs a day.  He has not had a pulmonary function test.  Chest x-ray in July of this year showed no active disease.  The patient does have a Symbicort inhaler and is using this however only on an as needed basis along with albuterol which is as needed  Note when I tried to do a medication reconciliation the patient was not clear on which medicines he was taking and which ones he had stopped and he has a list of nearly 40 medicines in his E HR.  04/10/2019 This is a follow-up visit from a video visit that occurred recently.  The patient was given doxycycline and pulse prednisone for COPD exacerbation and is maintaining Symbicort daily.  The patient was referred for the COPD evaluation.  I also refill the patient's tramadol at the last visit.  He has pending pulmonary function test which have yet to be obtained.  He does state that his back is improved on the tramadol.  He also states that he has broken teeth and they will bite into his gum and mouth causing pain in the mouth.  The tramadol helps to some degree but is requesting a mouthwash.  He has not seen a dentist recently.  Patient is still smoking 2 packs a day of cigarettes.  The patient did bring all his current active medications in with him today and I did a complete  medication reconciliation see is current new medication list  04/17/2019 Video visit request re dyspnea Copd  This patient was last seen in person on December 15 and since that time the patient has had recurrent exacerbation of COPD.  He has been using his Symbicort daily.  He states he awoke with acute shortness of breath and lung congestion.  This occurred 2 days ago.  He also had increased pain in the right side.  He has been coughing up profuse thick yellow mucus.  He is using the flutter valve and the nebulizer which helps to remove some of the mucus.  His mouth pain is improved with the [facility] mouthwash and  would like a refill.  He is also out of his tramadol and is yet to achieve a chronic pain management appointment.  There is no fever.  Note he just had a negative Covid test December 18 in preparation for pulmonary function studies which have yet to be scheduled  1/18: This is a follow-up from previous visit in December that was a video visit.  The patient did receive a round of Levaquin and prednisone and he improved to some degree.  He only has albuterol for his nebulizer and is about to run out.  The patient worsened since he ran out of prednisone and the antibiotic.  He still has a lot of phlegm that is difficult to raise.  He is not using his flutter valve every day.  He continues to smoke 1 pack a day of cigarettes but previously was at 2 packs a day The patient notes pressure in the frontal and sinus area and he denies any ear pain  Note his GAD-7 score is 6 and PHQ-9 score is 9 today  He states he cannot afford the co-pay at this time of the year with his insurance.  He has run out of insulin for the past 10 days and would prefer to have his medications filled at our pharmacy  The patient also had a referral to [facility] pain center but they declined his case.  Patient's been taking 50 mg 3 times daily of the tramadol  06/28/2019 Telephone visit: Since the last visit the patient states he still having shortness of breath but his cough and mucus production as well as breathing are better since taking the course of antibiotics and prednisone.  He is now down to 1 pack a day from 3 packs a day of cigarettes.  He states he still has no energy.  He states his lower back pain is under reasonable control with tramadol.  The patient is on Trulicity and insulin glargine for his diabetes but has run out of testing strips.  He also wishes a refill on his Maxalt for his migraines.  The patient maintains his inhaled medications without changes.   COPD He complains of chest tightness, cough, difficulty  breathing, shortness of breath and sputum production. There is no frequent throat clearing, hemoptysis, hoarse voice or wheezing. This is a chronic problem. The current episode started more than 1 year ago. The problem occurs every several days. The problem has been gradually improving. The cough is productive of sputum. Associated symptoms include dyspnea on exertion, malaise/fatigue and orthopnea. Pertinent negatives include no appetite change, chest pain, ear congestion, fever, headaches, heartburn, myalgias, nasal congestion, PND, postnasal drip, rhinorrhea, sneezing, sore throat, sweats or trouble swallowing. His symptoms are aggravated by any activity, change in weather, climbing stairs, lying down, exposure to fumes, strenuous activity, emotional stress and minimal  activity. His symptoms are alleviated by beta-agonist, oral steroids and steroid inhaler. Risk factors for lung disease include smoking/tobacco exposure. His past medical history is significant for COPD.   Past Medical History:  Diagnosis Date  . Back pain   . Bipolar disorder ([place])   . Cannabis use disorder, moderate, dependence ([place])   . Chronic pain   . Diabetes mellitus without complication ([place])   . Generalized anxiety disorder   . Headache   . Hypertension   . Schizoaffective disorder ([place])      Family History  Problem Relation Age of Onset  . Diabetes Mother   . Cancer Mother        unsure what type  . Heart disease Mother   . Hypertension Mother   . Dementia Father   . Diabetes Brother   . Dementia Maternal Grandfather   . Cancer Paternal Grandmother 36       breast  . Mental illness Neg Hx      Social History   Socioeconomic History  . Marital status: Married    Spouse name: Not on file  . Number of children: Not on file  . Years of education: Not on file  . Highest education level: Not on file  Occupational History  . Not on file  Tobacco Use  . Smoking status: Current Every Day Smoker     Packs/day: 2.00    Types: Cigarettes  . Smokeless tobacco: Never Used  Substance and Sexual Activity  . Alcohol use: Not Currently  . Drug use: Yes    Types: Marijuana    Comment: "have changed to hemp now" 09/17/17  . Sexual activity: Not on file  Other Topics Concern  . Not on file  Social History Narrative  . Not on file   Social Determinants of Health   Financial Resource Strain:   . Difficulty of Paying Living Expenses: Not on file  Food Insecurity:   . Worried About Charity fundraiser in the Last Year: Not on file  . Ran Out of Food in the Last Year: Not on file  Transportation Needs:   . Lack of Transportation (Medical): Not on file  . Lack of Transportation (Non-Medical): Not on file  Physical Activity:   . Days of Exercise per Week: Not on file  . Minutes of Exercise per Session: Not on file  Stress:   . Feeling of Stress : Not on file  Social Connections:   . Frequency of Communication with Friends and Family: Not on file  . Frequency of Social Gatherings with Friends and Family: Not on file  . Attends Religious Services: Not on file  . Active Member of Clubs or Organizations: Not on file  . Attends Archivist Meetings: Not on file  . Marital Status: Not on file  Intimate Partner Violence:   . Fear of Current or Ex-Partner: Not on file  . Emotionally Abused: Not on file  . Physically Abused: Not on file  . Sexually Abused: Not on file     Allergies  Allergen Reactions  . Imitrex [Sumatriptan] Other (See Comments)    mania     Outpatient Medications Prior to Visit  Medication Sig Dispense Refill  . albuterol (PROVENTIL) (2.5 MG/3ML) 0.083% nebulizer solution Take 3 mLs (2.5 mg total) by nebulization every 6 (six) hours as needed for wheezing or shortness of breath. 75 mL 1  . Blood Glucose Monitoring Suppl (TRUE METRIX METER) w/Device KIT Check blood sugars fasting and  at bedtime 1 kit 0  . budesonide-formoterol (SYMBICORT) 160-4.5 MCG/ACT  inhaler Inhale 2 puffs into the lungs 2 (two) times daily. 1 Inhaler 3  . divalproex (DEPAKOTE ER) 500 MG 24 hr tablet Take 3 tablets (1,500 mg total) by mouth at bedtime. 90 tablet 11  . Dulaglutide (TRULICITY) 6.78 LF/8.1OF SOPN ADMINISTER 0.5 ML UNDER THE SKIN 1 TIME A WEEK 2 mL 2  . gabapentin (NEURONTIN) 300 MG capsule Take one pill twice per day and two at bedtime 120 capsule 5  . glucose blood (TRUE METRIX BLOOD GLUCOSE TEST) test strip Use as instructed 100 each 12  . Insulin Glargine (BASAGLAR KWIKPEN) 100 UNIT/ML SOPN Inject 0.35 mLs (35 Units total) into the skin daily. 15 mL 2  . Insulin Pen Needle (TRUEPLUS PEN NEEDLES) 32G X 4 MM MISC Use as directed to inject insulin 100 each 11  . losartan-hydrochlorothiazide (HYZAAR) 100-25 MG tablet Take 1 tablet by mouth daily. 90 tablet 3  . magic mouthwash w/lidocaine SOLN Take 5 mLs by mouth 4 (four) times daily as needed for mouth pain. 240 mL 0  . Respiratory Therapy Supplies (FLUTTER) DEVI Use with 4 times daily 1 each 0  . Spacer/Aero-Holding Chambers (AEROCHAMBER MV) inhaler Use as instructed 1 each 0  . traMADol (ULTRAM) 50 MG tablet Take 1 tablet (50 mg total) by mouth every 6 (six) hours as needed. 50 tablet 0  . TRUEplus Lancets 28G MISC Use to measure blood sugar twice a day 100 each 1  . VENTOLIN HFA 108 (90 Base) MCG/ACT inhaler Inhale 2 puffs into the lungs every 4 (four) hours as needed. 18 g 11  . ipratropium (ATROVENT) 0.02 % nebulizer solution Take 2.5 mLs (0.5 mg total) by nebulization 4 (four) times daily. 300 mL 2  . levofloxacin (LEVAQUIN) 500 MG tablet Take 1 tablet (500 mg total) by mouth daily. (Patient not taking: Reported on 05/14/2019) 5 tablet 0  . paliperidone (INVEGA SUSTENNA) 156 MG/ML SUSY injection Inject 1 mL (156 mg total) into the muscle once for 1 dose. Due 5/1 1 mL 11  . amoxicillin-clavulanate (AUGMENTIN) 875-125 MG tablet Take 1 tablet by mouth 2 (two) times daily. 20 tablet 0  . traZODone (DESYREL) 100  MG tablet Take 200 mg by mouth at bedtime as needed.     No facility-administered medications prior to visit.    Review of Systems  Constitutional: Positive for malaise/fatigue. Negative for appetite change and fever.  HENT: Negative for hoarse voice, postnasal drip, rhinorrhea, sneezing, sore throat and trouble swallowing.   Respiratory: Positive for cough, sputum production and shortness of breath. Negative for hemoptysis and wheezing.   Cardiovascular: Positive for dyspnea on exertion. Negative for chest pain and PND.  Gastrointestinal: Negative for heartburn.  Musculoskeletal: Negative for myalgias.  Neurological: Negative for headaches.   Constitutional:   No  weight loss, night sweats,  Fevers, chills, fatigue, lassitude. HEENT:    headaches,  Difficulty swallowing,  Tooth/dental problems,  Sore throat,                No sneezing, itching, ear ache, nasal congestion, post nasal drip,   CV:  No chest pain,  Orthopnea, PND, swelling in lower extremities, anasarca, dizziness, palpitations  GI  No heartburn, indigestion, abdominal pain, nausea, vomiting, diarrhea, change in bowel habits, loss of appetite  Resp:  shortness of breath with exertion or at rest.  No excess mucus,  productive cough,  No non-productive cough,  No coughing up of blood.  No change in color of mucus.  No wheezing.  No chest wall deformity  Skin: no rash or lesions.  GU: no dysuria, change in color of urine, no urgency or frequency.  No flank pain.  MS:   joint pain or swelling.  No decreased range of motion.   back pain.  Psych:  No change in mood or affect. No depression or anxiety.  No memory loss.      Objective:   Physical Exam    There were no vitals filed for this visit. There were no vitals filed for this visit.  Gen: Pleasant, well-nourished, in no distress,  normal affect  ENT: No lesions,  mouth clear,  oropharynx clear, no postnasal drip  Neck: No JVD, no TMG, no carotid  bruits  Lungs: No use of accessory muscles, no dullness to percussion, there is distant breath sounds with expired wheezes right lower lung zone  Cardiovascular: RRR, heart sounds normal, no murmur or gallops, no peripheral edema  Abdomen: soft and NT, no HSM,  BS normal  Musculoskeletal: No deformities, no cyanosis or clubbing, lower back is tender in the paraspinal area and lumbar region  Neuro: alert, non focal  Skin: Warm, no lesions or rashes   Opioid Risk  05/14/2019  Alcohol 0  Illegal Drugs 0  Rx Drugs 4  Alcohol 0  Illegal Drugs 0  Rx Drugs 0  Age between 16-45 years  0  History of Preadolescent Sexual Abuse 0  Psychological Disease 2  Opioid Risk Tool Scoring 6  Opioid Risk Interpretation Moderate Risk   CBG 381     Assessment & Plan:  I personally reviewed all images and lab data in the [facility] system as well as any outside material available during this office visit and agree with the  radiology impressions.   COPD with chronic bronchitis ([place]) Obstructive lung disease with minimal mucus production possibly not using flutter valve well at this time  Plan is to maintain flutter valve with nebulizer treatment with ipratropium and continue Symbicort 2 inhalations twice daily  No additional antibiotics or prednisone are indicated  Smoking cessation further as advised  Migraine headache without aura History of migraine headaches without aura plan will be to refill the Maxalt  Tobacco use I spent about 5 minutes time at this visit discussing need for smoking cessation note patient previously had been given a prescription for nicotine but did not follow through on this     . Current smoking consumption amount: 1PPD  . Dicsussion on advise to quit smoking and smoking impacts:  Cardio vascular and lung disease impact  . Patient's willingness to quit:  Not engaged to quit at this time   . Methods to quit smoking discussed:  Failed nicotine  replacement  . Medication management of smoking session drugs discussed: to follow up with mental health provider   . Setting quit date  Not yet arranged   Time spent counseling the patient:  5 min     [facility] was seen today for copd.  Diagnoses and all orders for this visit:  COPD with chronic bronchitis ([place])  Chronic pain syndrome  Tobacco use  Type 2 diabetes mellitus with hyperglycemia, with long-term current use of insulin ([facility])  Essential hypertension  Migraine without aura and without status migrainosus, not intractable  Other orders -     Discontinue: rizatriptan (MAXALT) 10 MG tablet; Take 1 tablet (10 mg total) by mouth as needed for migraine. May repeat in 2 hours if  needed -     rizatriptan (MAXALT) 10 MG tablet; Take 1 tablet (10 mg total) by mouth as needed for migraine. May repeat in 2 hours if needed     Follow Up Instructions: The patient knows a follow-up visit will be made face-to-face end of April   I discussed the assessment and treatment plan with the patient. The patient was provided an opportunity to ask questions and all were answered. The patient agreed with the plan and demonstrated an understanding of the instructions.   The patient was advised to call back or seek an in-person evaluation if the symptoms worsen or if the condition fails to improve as anticipated.  I provided 30 minutes of non-face-to-face time during this encounter  including  median intraservice time , review of notes, labs, imaging, medications  and explaining diagnosis and management to the patient .    Asencion Noble, MD

## 2019-06-29 NOTE — Assessment & Plan Note (Signed)
History of migraine headaches without aura plan will be to refill the Maxalt

## 2019-06-29 NOTE — Assessment & Plan Note (Signed)
Obstructive lung disease with minimal mucus production possibly not using flutter valve well at this time  Plan is to maintain flutter valve with nebulizer treatment with ipratropium and continue Symbicort 2 inhalations twice daily  No additional antibiotics or prednisone are indicated  Smoking cessation further as advised

## 2019-06-29 NOTE — Assessment & Plan Note (Signed)
I spent about 5 minutes time at this visit discussing need for smoking cessation note patient previously had been given a prescription for nicotine but did not follow through on this     . Current smoking consumption amount: 1PPD  . Dicsussion on advise to quit smoking and smoking impacts:  Cardio vascular and lung disease impact  . Patient's willingness to quit:  Not engaged to quit at this time   . Methods to quit smoking discussed:  Failed nicotine replacement  . Medication management of smoking session drugs discussed: to follow up with mental health provider   . Setting quit date  Not yet arranged   Time spent counseling the patient:  5 min

## 2019-07-09 ENCOUNTER — Telehealth: Payer: Self-pay | Admitting: Family Medicine

## 2019-07-09 NOTE — Telephone Encounter (Signed)
1) Medication(s) Requested (by name): Tramadol   2) Pharmacy of Choice: [facility] pharmacy   3) Special Requests:   Approved medications will be sent to the pharmacy, we will reach out if there is an issue.  Requests made after 3pm may not be addressed until the following business day!  If a patient is unsure of the name of the medication(s) please note and ask patient to call back when they are able to provide all info, do not send to responsible party until all information is available!

## 2019-07-10 MED ORDER — TRAMADOL HCL 50 MG PO TABS: 50.0000 mg | ORAL_TABLET | Freq: Four times a day (QID) | ORAL | 0 refills | Status: DC | PRN

## 2019-07-10 MED FILL — traMADol HCL 50 MG TABS: 50 | 12 days supply | Qty: 50 | Fill #0

## 2019-07-10 NOTE — Telephone Encounter (Signed)
Pt with pain contract.  [place] database checked, no other Rx seen.  Tramadol Refilled to our pharmacy

## 2019-07-20 ENCOUNTER — Telehealth: Payer: Self-pay | Admitting: Family Medicine

## 2019-07-20 ENCOUNTER — Other Ambulatory Visit: Payer: Self-pay | Admitting: Pharmacist

## 2019-07-20 ENCOUNTER — Encounter: Payer: Self-pay | Admitting: Family Medicine

## 2019-07-20 ENCOUNTER — Other Ambulatory Visit: Payer: Self-pay

## 2019-07-20 ENCOUNTER — Ambulatory Visit: Payer: Medicare Other | Admitting: Family Medicine

## 2019-07-20 ENCOUNTER — Ambulatory Visit: Payer: Medicare Other | Attending: Family Medicine | Admitting: Family Medicine

## 2019-07-20 VITALS — BP 124/85 | HR 83 | Temp 97.2°F | Resp 16 | Wt 249.6 lb

## 2019-07-20 DIAGNOSIS — G43909 Migraine, unspecified, not intractable, without status migrainosus: Secondary | ICD-10-CM | POA: Insufficient documentation

## 2019-07-20 DIAGNOSIS — I739 Peripheral vascular disease, unspecified: Secondary | ICD-10-CM | POA: Diagnosis not present

## 2019-07-20 DIAGNOSIS — Z79899 Other long term (current) drug therapy: Secondary | ICD-10-CM

## 2019-07-20 DIAGNOSIS — G4452 New daily persistent headache (NDPH): Secondary | ICD-10-CM

## 2019-07-20 DIAGNOSIS — Z8249 Family history of ischemic heart disease and other diseases of the circulatory system: Secondary | ICD-10-CM | POA: Insufficient documentation

## 2019-07-20 DIAGNOSIS — M62838 Other muscle spasm: Secondary | ICD-10-CM | POA: Insufficient documentation

## 2019-07-20 DIAGNOSIS — M5416 Radiculopathy, lumbar region: Secondary | ICD-10-CM | POA: Insufficient documentation

## 2019-07-20 DIAGNOSIS — M5442 Lumbago with sciatica, left side: Secondary | ICD-10-CM

## 2019-07-20 DIAGNOSIS — E1169 Type 2 diabetes mellitus with other specified complication: Secondary | ICD-10-CM | POA: Diagnosis not present

## 2019-07-20 DIAGNOSIS — Z720 Tobacco use: Secondary | ICD-10-CM

## 2019-07-20 DIAGNOSIS — G8929 Other chronic pain: Secondary | ICD-10-CM

## 2019-07-20 DIAGNOSIS — E1165 Type 2 diabetes mellitus with hyperglycemia: Secondary | ICD-10-CM | POA: Diagnosis present

## 2019-07-20 DIAGNOSIS — I10 Essential (primary) hypertension: Secondary | ICD-10-CM | POA: Diagnosis not present

## 2019-07-20 DIAGNOSIS — Z833 Family history of diabetes mellitus: Secondary | ICD-10-CM | POA: Insufficient documentation

## 2019-07-20 DIAGNOSIS — F17210 Nicotine dependence, cigarettes, uncomplicated: Secondary | ICD-10-CM

## 2019-07-20 DIAGNOSIS — E785 Hyperlipidemia, unspecified: Secondary | ICD-10-CM

## 2019-07-20 DIAGNOSIS — Z794 Long term (current) use of insulin: Secondary | ICD-10-CM

## 2019-07-20 DIAGNOSIS — E1351 Other specified diabetes mellitus with diabetic peripheral angiopathy without gangrene: Secondary | ICD-10-CM | POA: Diagnosis not present

## 2019-07-20 DIAGNOSIS — J449 Chronic obstructive pulmonary disease, unspecified: Secondary | ICD-10-CM

## 2019-07-20 LAB — POCT GLYCOSYLATED HEMOGLOBIN (HGB A1C): HbA1c, POC (controlled diabetic range): 7.1 % — AB (ref 0.0–7.0)

## 2019-07-20 LAB — GLUCOSE, POCT (MANUAL RESULT ENTRY): POC Glucose: 216 mg/dL — AB (ref 70–99)

## 2019-07-20 MED ORDER — GABAPENTIN 300 MG PO CAPS: ORAL_CAPSULE | ORAL | 5 refills | Status: DC

## 2019-07-20 MED ORDER — BLOOD GLUCOSE METER KIT: PACK | 0 refills | Status: DC

## 2019-07-20 MED ORDER — BLOOD GLUCOSE MONITOR KIT: PACK | 0 refills | Status: DC

## 2019-07-20 MED ORDER — DICLOFENAC SODIUM 50 MG PO TBEC: 50.0000 mg | DELAYED_RELEASE_TABLET | Freq: Two times a day (BID) | ORAL | 1 refills | Status: DC | PRN

## 2019-07-20 MED ORDER — TIZANIDINE HCL 2 MG PO CAPS: ORAL_CAPSULE | ORAL | 1 refills | Status: DC

## 2019-07-20 MED ORDER — BLOOD GLUCOSE MONITOR KIT: PACK | 0 refills | Status: AC
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
## 2019-07-20 NOTE — Patient Instructions (Signed)
Stop Trulicity for now since it is making you feel bad. Please take your insulin-Basaglar either before breakfast or lunch. Bring your glucometer with you when you follow-up with Luke-clinical pharmacist. Also make a follow-up appointment if your headache is not better in the next 1-2 weeks.

## 2019-07-20 NOTE — Progress Notes (Signed)
Pt states he think he is allergic to the insulin.

## 2019-07-20 NOTE — Telephone Encounter (Signed)
1) Medication(s) Requested (by name): DICLOFENAC     2) Pharmacy of Choice: [facility]   3) Special Requests:   Approved medications will be sent to the pharmacy, we will reach out if there is an issue.  Requests made after 3pm may not be addressed until the following business day!  If a patient is unsure of the name of the medication(s) please note and ask patient to call back when they are able to provide all info, do not send to responsible party until all information is available!

## 2019-07-21 LAB — CBC WITH DIFFERENTIAL/PLATELET
Basophils Absolute: 0.1 x10E3/uL (ref 0.0–0.2)
Basos: 1 %
EOS (ABSOLUTE): 0.2 x10E3/uL (ref 0.0–0.4)
Eos: 2 %
Hematocrit: 46.8 % (ref 37.5–51.0)
Hemoglobin: 16 g/dL (ref 13.0–17.7)
Immature Grans (Abs): 0 x10E3/uL (ref 0.0–0.1)
Immature Granulocytes: 0 %
Lymphocytes Absolute: 2.4 x10E3/uL (ref 0.7–3.1)
Lymphs: 29 %
MCH: 31.9 pg (ref 26.6–33.0)
MCHC: 34.2 g/dL (ref 31.5–35.7)
MCV: 93 fL (ref 79–97)
Monocytes Absolute: 0.9 x10E3/uL (ref 0.1–0.9)
Monocytes: 10 %
Neutrophils Absolute: 4.6 x10E3/uL (ref 1.4–7.0)
Neutrophils: 58 %
Platelets: 175 x10E3/uL (ref 150–450)
RBC: 5.01 x10E6/uL (ref 4.14–5.80)
RDW: 14 % (ref 11.6–15.4)
WBC: 8.2 x10E3/uL (ref 3.4–10.8)

## 2019-07-21 LAB — LIPID PANEL
Chol/HDL Ratio: 4.5 ratio (ref 0.0–5.0)
Cholesterol, Total: 144 mg/dL (ref 100–199)
HDL: 32 mg/dL — ABNORMAL LOW
LDL Chol Calc (NIH): 72 mg/dL (ref 0–99)
Triglycerides: 246 mg/dL — ABNORMAL HIGH (ref 0–149)
VLDL Cholesterol Cal: 40 mg/dL (ref 5–40)

## 2019-07-21 LAB — MICROALBUMIN / CREATININE URINE RATIO
Creatinine, Urine: 265.3 mg/dL
Microalb/Creat Ratio: 9 mg/g{creat} (ref 0–29)
Microalbumin, Urine: 22.7 ug/mL

## 2019-07-21 LAB — LIPASE: Lipase: 17 U/L (ref 13–78)

## 2019-07-22 ENCOUNTER — Encounter: Payer: Self-pay | Admitting: Family Medicine

## 2019-07-23 ENCOUNTER — Other Ambulatory Visit: Payer: Self-pay | Admitting: Family Medicine

## 2019-07-23 ENCOUNTER — Telehealth: Payer: Self-pay | Admitting: Family Medicine

## 2019-07-23 DIAGNOSIS — G894 Chronic pain syndrome: Secondary | ICD-10-CM

## 2019-07-23 NOTE — Telephone Encounter (Signed)
1) Medication(s) Requested (by name): traMADol (ULTRAM) 50 MG tablet    2) Pharmacy of Choice: [facility]   3) Special Requests: Please, call to pick it up     Approved medications will be sent to the pharmacy, we will reach out if there is an issue.  Requests made after 3pm may not be addressed until the following business day!  If a patient is unsure of the name of the medication(s) please note and ask patient to call back when they are able to provide all info, do not send to responsible party until all information is available!

## 2019-07-23 NOTE — Progress Notes (Signed)
Patient ID: Joshua [name], male   DOB: [birth year], 64 y.o.   MRN: [id number]   Patient was recently seen in the office but left message today that he needs a refill of his tramadol.  On PDMR review, patient had last tramadol refill on 07/10/2019 per Dr. Joya Gaskins to take one pill every 6 hours as needed #50. Per PDMR reivew, this is considered a 12 day supply. Will forward refill request to Dr. Joya Gaskins. Tramadol can also cause headaches in some patient's and patient is currently reporting issues with recurrent headaches so I am not sure if tramadol may be a contributing factor.

## 2019-07-23 NOTE — Telephone Encounter (Signed)
Please contact patient and let him know that he is not due for tramadol refill yet

## 2019-07-24 MED ORDER — TRAMADOL HCL 50 MG PO TABS: 50.0000 mg | ORAL_TABLET | Freq: Four times a day (QID) | ORAL | 0 refills | Status: DC | PRN

## 2019-07-24 MED FILL — traMADol HCL 50 MG TABS: 50 | 12 days supply | Qty: 50 | Fill #0

## 2019-07-24 NOTE — Telephone Encounter (Signed)
I will refill the tramadol and I do need him to come in for a drug screen as per our pain contract.  Please obtain for the pt a lab appt.  [place] PDMP reviewed.

## 2019-07-24 NOTE — Telephone Encounter (Signed)
Note   1) Medication(s) Requested (by name): traMADol (ULTRAM) 50 MG tablet    2) Pharmacy of Choice: [facility]   3) Special Requests: Please, call to pick it up     Approved medications will be sent to the pharmacy, we will reach out if there is an issue.  Requests made after 3pm may not be addressed until the following business day!  If a patient is unsure of the name of the medication(s) please note and ask patient to call back when they are able to provide all info, do not send to responsible party until all information is ava

## 2019-07-24 NOTE — Telephone Encounter (Signed)
Spoke with patient and informed him with what provider stated about his Tramadol. Per pt he was instructed to call after 12.5 days.   Per pt he wants provider to call him due to the pain med contract and wants to talk to the provider.

## 2019-07-25 NOTE — Telephone Encounter (Signed)
Schedule patient for urine drug screen to receive refill.

## 2019-08-03 ENCOUNTER — Ambulatory Visit: Payer: Medicare Other | Admitting: Pharmacist

## 2019-08-04 ENCOUNTER — Emergency Department (HOSPITAL_COMMUNITY)
Admission: EM | Admit: 2019-08-04 | Discharge: 2019-08-04 | Disposition: A | Discharge status: Other Acute Inpatient Hospital | Payer: Medicare Other | Attending: Emergency Medicine | Admitting: Emergency Medicine

## 2019-08-04 ENCOUNTER — Encounter (HOSPITAL_COMMUNITY): Payer: Self-pay

## 2019-08-04 DIAGNOSIS — Z20822 Contact with and (suspected) exposure to covid-19: Secondary | ICD-10-CM | POA: Insufficient documentation

## 2019-08-04 DIAGNOSIS — R000 Tachycardia, unspecified: Secondary | ICD-10-CM | POA: Diagnosis not present

## 2019-08-04 DIAGNOSIS — F29 Unspecified psychosis not due to a substance or known physiological condition: Secondary | ICD-10-CM | POA: Diagnosis not present

## 2019-08-04 DIAGNOSIS — I10 Essential (primary) hypertension: Secondary | ICD-10-CM | POA: Insufficient documentation

## 2019-08-04 DIAGNOSIS — F17210 Nicotine dependence, cigarettes, uncomplicated: Secondary | ICD-10-CM | POA: Diagnosis not present

## 2019-08-04 DIAGNOSIS — F250 Schizoaffective disorder, bipolar type: Secondary | ICD-10-CM | POA: Insufficient documentation

## 2019-08-04 DIAGNOSIS — E1169 Type 2 diabetes mellitus with other specified complication: Secondary | ICD-10-CM | POA: Diagnosis not present

## 2019-08-04 DIAGNOSIS — Z794 Long term (current) use of insulin: Secondary | ICD-10-CM | POA: Insufficient documentation

## 2019-08-04 DIAGNOSIS — G894 Chronic pain syndrome: Secondary | ICD-10-CM | POA: Diagnosis not present

## 2019-08-04 DIAGNOSIS — F22 Delusional disorders: Secondary | ICD-10-CM | POA: Diagnosis present

## 2019-08-04 DIAGNOSIS — F17200 Nicotine dependence, unspecified, uncomplicated: Secondary | ICD-10-CM | POA: Diagnosis present

## 2019-08-04 DIAGNOSIS — Z79899 Other long term (current) drug therapy: Secondary | ICD-10-CM | POA: Diagnosis not present

## 2019-08-04 DIAGNOSIS — E119 Type 2 diabetes mellitus without complications: Secondary | ICD-10-CM | POA: Diagnosis present

## 2019-08-04 DIAGNOSIS — Z720 Tobacco use: Secondary | ICD-10-CM | POA: Diagnosis not present

## 2019-08-04 LAB — CBC WITH DIFFERENTIAL/PLATELET
Abs Immature Granulocytes: 0.02 10*3/uL (ref 0.00–0.07)
Basophils Absolute: 0 10*3/uL (ref 0.0–0.1)
Basophils Relative: 1 %
Eosinophils Absolute: 0.1 10*3/uL (ref 0.0–0.5)
Eosinophils Relative: 1 %
HCT: 41.8 % (ref 39.0–52.0)
Hemoglobin: 14.1 g/dL (ref 13.0–17.0)
Immature Granulocytes: 0 %
Lymphocytes Relative: 22 %
Lymphs Abs: 1.4 10*3/uL (ref 0.7–4.0)
MCH: 32.4 pg (ref 26.0–34.0)
MCHC: 33.7 g/dL (ref 30.0–36.0)
MCV: 96.1 fL (ref 80.0–100.0)
Monocytes Absolute: 0.7 10*3/uL (ref 0.1–1.0)
Monocytes Relative: 12 %
Neutro Abs: 4 10*3/uL (ref 1.7–7.7)
Neutrophils Relative %: 64 %
Platelets: 151 10*3/uL (ref 150–400)
RBC: 4.35 MIL/uL (ref 4.22–5.81)
RDW: 13.3 % (ref 11.5–15.5)
WBC: 6.2 10*3/uL (ref 4.0–10.5)
nRBC: 0 % (ref 0.0–0.2)

## 2019-08-04 LAB — COMPREHENSIVE METABOLIC PANEL
ALT: 18 U/L (ref 0–44)
AST: 25 U/L (ref 15–41)
Albumin: 3.7 g/dL (ref 3.5–5.0)
Alkaline Phosphatase: 76 U/L (ref 38–126)
Anion gap: 12 (ref 5–15)
BUN: 7 mg/dL — ABNORMAL LOW (ref 8–23)
CO2: 22 mmol/L (ref 22–32)
Calcium: 8.7 mg/dL — ABNORMAL LOW (ref 8.9–10.3)
Chloride: 97 mmol/L — ABNORMAL LOW (ref 98–111)
Creatinine, Ser: 1.07 mg/dL (ref 0.61–1.24)
GFR calc Af Amer: >60 mL/min (ref >60)
GFR calc non Af Amer: >60 mL/min (ref >60)
Glucose, Bld: 278 mg/dL — ABNORMAL HIGH (ref 70–99)
Potassium: 4.8 mmol/L (ref 3.5–5.1)
Sodium: 131 mmol/L — ABNORMAL LOW (ref 135–145)
Total Bilirubin: 1.8 mg/dL — ABNORMAL HIGH (ref 0.3–1.2)
Total Protein: 6.6 g/dL (ref 6.5–8.1)

## 2019-08-04 LAB — URINALYSIS, ROUTINE W REFLEX MICROSCOPIC
Bilirubin Urine: NEGATIVE
Glucose, UA: 150 mg/dL — AB
Hgb urine dipstick: NEGATIVE
Ketones, ur: NEGATIVE mg/dL
Leukocytes,Ua: NEGATIVE
Nitrite: NEGATIVE
Protein, ur: NEGATIVE mg/dL
Specific Gravity, Urine: 1.005 (ref 1.005–1.030)
pH: 7 (ref 5.0–8.0)

## 2019-08-04 LAB — RESPIRATORY PANEL BY RT PCR (FLU A&B, COVID)
Influenza A by PCR: NEGATIVE
Influenza B by PCR: NEGATIVE
SARS Coronavirus 2 by RT PCR: NEGATIVE

## 2019-08-04 LAB — CBG MONITORING, ED
Glucose-Capillary: 279 mg/dL — ABNORMAL HIGH (ref 70–99)
Glucose-Capillary: 308 mg/dL — ABNORMAL HIGH (ref 70–99)
Glucose-Capillary: 130 mg/dL — ABNORMAL HIGH (ref 70–99)

## 2019-08-04 LAB — RAPID URINE DRUG SCREEN, HOSP PERFORMED
Amphetamines: NOT DETECTED
Barbiturates: NOT DETECTED
Benzodiazepines: NOT DETECTED
Cocaine: NOT DETECTED
Opiates: NOT DETECTED
Tetrahydrocannabinol: POSITIVE — AB

## 2019-08-04 LAB — ETHANOL: Alcohol, Ethyl (B): <10 mg/dL (ref <10)

## 2019-08-04 MED ORDER — NICOTINE 21 MG/24HR TD PT24
21.0000 mg | MEDICATED_PATCH | Freq: Every day | TRANSDERMAL | Status: DC
  Administered 2019-08-04: 21 mg via TRANSDERMAL
  Filled 2019-08-04: qty 1

## 2019-08-04 MED ORDER — HYDROCHLOROTHIAZIDE 25 MG PO TABS
25.0000 mg | ORAL_TABLET | Freq: Every day | ORAL | Status: DC
  Administered 2019-08-04: 25 mg via ORAL
  Filled 2019-08-04 (×2): qty 1

## 2019-08-04 MED ORDER — GABAPENTIN 300 MG PO CAPS
300.0000 mg | ORAL_CAPSULE | Freq: Three times a day (TID) | ORAL | Status: DC
  Administered 2019-08-04: 300 mg via ORAL
  Filled 2019-08-04: qty 1

## 2019-08-04 MED ORDER — INSULIN GLARGINE 100 UNIT/ML ~~LOC~~ SOLN
35.0000 [IU] | Freq: Every day | SUBCUTANEOUS | Status: DC
  Administered 2019-08-04: 35 [IU] via SUBCUTANEOUS
  Filled 2019-08-04: qty 0.35

## 2019-08-04 MED ORDER — LORAZEPAM 1 MG PO TABS
1.0000 mg | ORAL_TABLET | Freq: Once | ORAL | Status: AC
  Administered 2019-08-04: 1 mg via ORAL
  Filled 2019-08-04: qty 1

## 2019-08-04 MED ORDER — INSULIN ASPART 100 UNIT/ML ~~LOC~~ SOLN
0.0000 [IU] | Freq: Three times a day (TID) | SUBCUTANEOUS | Status: DC
  Administered 2019-08-04: 2 [IU] via SUBCUTANEOUS
  Administered 2019-08-04: 11 [IU] via SUBCUTANEOUS
  Filled 2019-08-04: qty 0.15

## 2019-08-04 MED ORDER — ALBUTEROL SULFATE (2.5 MG/3ML) 0.083% IN NEBU: 2.5000 mg | INHALATION_SOLUTION | Freq: Four times a day (QID) | RESPIRATORY_TRACT | Status: DC | PRN

## 2019-08-04 MED ORDER — DIVALPROEX SODIUM ER 500 MG PO TB24: 1500.0000 mg | ORAL_TABLET | Freq: Every day | ORAL | Status: DC

## 2019-08-04 MED ORDER — LOSARTAN POTASSIUM-HCTZ 100-25 MG PO TABS: 1.0000 | ORAL_TABLET | Freq: Every day | ORAL | Status: DC

## 2019-08-04 MED ORDER — LOSARTAN POTASSIUM 50 MG PO TABS
100.0000 mg | ORAL_TABLET | Freq: Every day | ORAL | Status: DC
  Administered 2019-08-04: 100 mg via ORAL
  Filled 2019-08-04 (×2): qty 2

## 2019-08-04 NOTE — Progress Notes (Signed)
08/04/2019  1006  Report given to Baxter Flattery at [place].

## 2019-08-04 NOTE — ED Notes (Signed)
Pt ambulated to restroom. 

## 2019-08-04 NOTE — ED Provider Notes (Signed)
[facility] DEPT Provider Note   CSN: [id number] Arrival date & time: 08/04/19  0149     History No chief complaint on file.   Joshua [name] is a 64 y.o. male.  Patient brought to the emergency department by police after family has initiated involuntary commitment.  Patient has a history of schizophrenia and bipolar disorder, has be paranoid, delusional.  He is hyper religious, thinks he is [place].        Past Medical History:  Diagnosis Date  . Back pain   . Bipolar disorder ([place])   . Cannabis use disorder, moderate, dependence ([place])   . Chronic pain   . Diabetes mellitus without complication ([place])   . Generalized anxiety disorder   . Headache   . Hypertension   . Schizoaffective disorder [facility])     Patient Active Problem List   Diagnosis Date Noted  . Type 2 diabetes mellitus with hyperglycemia ([place]) 05/14/2019  . Peripheral vascular disease due to secondary diabetes ([place]) 05/14/2019  . Thrombocytopenia ([place]) 05/14/2019  . Chronic low back pain with left-sided sciatica 05/14/2019  . Tobacco use 05/14/2019  . COPD with chronic bronchitis ([place]) 03/27/2019  . Schizoaffective disorder ([place]) 07/24/2018  . Blister of right foot 10/29/2016  . Chronic pain syndrome 10/14/2016  . Osteoarthritis 10/14/2016  . HTN (hypertension) 10/14/2016  . Migraine headache without aura 10/14/2016  . Schizoaffective disorder, bipolar type ([place]) 05/10/2016  . Cannabis use disorder, severe, dependence ([place]) 05/10/2016  . Generalized anxiety disorder 05/04/2016    Past Surgical History:  Procedure Laterality Date  . APPENDECTOMY  1970       Family History  Problem Relation Age of Onset  . Diabetes Mother   . Cancer Mother        unsure what type  . Heart disease Mother   . Hypertension Mother   . Dementia Father   . Diabetes Brother   . Dementia Maternal Grandfather   . Cancer Paternal Grandmother 54       breast  . Mental illness  Neg Hx     Social History   Tobacco Use  . Smoking status: Current Every Day Smoker    Packs/day: 2.00    Types: Cigarettes  . Smokeless tobacco: Never Used  Substance Use Topics  . Alcohol use: Not Currently  . Drug use: Yes    Types: Marijuana    Comment: "have changed to hemp now" 09/17/17    Home Medications Prior to Admission medications   Medication Sig Start Date End Date Taking? Authorizing Provider  albuterol (PROVENTIL) (2.5 MG/3ML) 0.083% nebulizer solution Take 3 mLs (2.5 mg total) by nebulization every 6 (six) hours as needed for wheezing or shortness of breath. 05/10/19   Fulp, Cammie, MD  blood glucose meter kit and supplies KIT Dispense based on patient and insurance preference. Use up to four times daily as directed. ICD-10 E11.65  Z79.4 07/20/19   Antony Blackbird, MD  blood glucose meter kit and supplies Dispense based on patient and insurance preference. Use up to four times daily as directed. (FOR ICD-10 E10.9, E11.9). 07/20/19   Fulp, Cammie, MD  Blood Glucose Monitoring Suppl (TRUE METRIX METER) w/Device KIT Check blood sugars fasting and at bedtime 05/14/19   Elsie Stain, MD  budesonide-formoterol [facility]) 160-4.5 MCG/ACT inhaler Inhale 2 puffs into the lungs 2 (two) times daily. 05/14/19   Elsie Stain, MD  diclofenac (VOLTAREN) 50 MG EC tablet Take 1 tablet (50 mg total)  by mouth 2 (two) times daily as needed for moderate pain. Eat before taking medication 07/20/19   Fulp, Cammie, MD  divalproex (DEPAKOTE ER) 500 MG 24 hr tablet Take 3 tablets (1,500 mg total) by mouth at bedtime. 07/27/18   [name], MD  Dulaglutide (TRULICITY) 1.61 WR/6.0AV SOPN ADMINISTER 0.5 ML UNDER THE SKIN 1 TIME A WEEK 05/14/19   Elsie Stain, MD  gabapentin (NEURONTIN) 300 MG capsule Take one pill twice per day and two at bedtime 07/20/19   Fulp, Cammie, MD  glucose blood (TRUE METRIX BLOOD GLUCOSE TEST) test strip Use as instructed 05/14/19   Elsie Stain, MD  Insulin  Glargine (BASAGLAR KWIKPEN) 100 UNIT/ML SOPN Inject 0.35 mLs (35 Units total) into the skin daily. 05/14/19   Elsie Stain, MD  Insulin Pen Needle (TRUEPLUS PEN NEEDLES) 32G X 4 MM MISC Use as directed to inject insulin 05/14/19   Elsie Stain, MD  ipratropium (ATROVENT) 0.02 % nebulizer solution Take 2.5 mLs (0.5 mg total) by nebulization 4 (four) times daily. 05/14/19 06/13/19  Elsie Stain, MD  losartan-hydrochlorothiazide (HYZAAR) 100-25 MG tablet Take 1 tablet by mouth daily. 03/27/19   Elsie Stain, MD  magic mouthwash w/lidocaine SOLN Take 5 mLs by mouth 4 (four) times daily as needed for mouth pain. 05/02/19   Elsie Stain, MD  paliperidone (INVEGA SUSTENNA) 156 MG/ML SUSY injection Inject 1 mL (156 mg total) into the muscle once for 1 dose. Due 5/1 07/27/18 02/02/19  [name], MD  Respiratory Therapy Supplies (FLUTTER) DEVI Use with 4 times daily 03/27/19   Elsie Stain, MD  rizatriptan (MAXALT) 10 MG tablet Take 1 tablet (10 mg total) by mouth as needed for migraine. May repeat in 2 hours if needed 06/28/19   Elsie Stain, MD  Spacer/Aero-Holding Chambers (AEROCHAMBER MV) inhaler Use as instructed 03/27/19   Elsie Stain, MD  tizanidine (ZANAFLEX) 2 MG capsule One or two at bedtime to help with headache/muscle spasm 07/20/19   Fulp, Cammie, MD  traMADol (ULTRAM) 50 MG tablet Take 1 tablet (50 mg total) by mouth every 6 (six) hours as needed. 07/24/19   Elsie Stain, MD  TRUEplus Lancets 28G MISC Use to measure blood sugar twice a day 05/14/19   Elsie Stain, MD  VENTOLIN HFA 108 [phone] Base) MCG/ACT inhaler Inhale 2 puffs into the lungs every 4 (four) hours as needed. 02/02/19   Fulp, Ander Gaster, MD    Allergies    Imitrex [sumatriptan]  Review of Systems   Review of Systems  Psychiatric/Behavioral: Positive for behavioral problems.  All other systems reviewed and are negative.   Physical Exam Updated Vital Signs There were no vitals taken for this  visit.  Physical Exam Vitals and nursing note reviewed.  Constitutional:      General: He is not in acute distress.    Appearance: He is well-developed.  HENT:     Head: Normocephalic and atraumatic.     Right Ear: Hearing normal.     Left Ear: Hearing normal.     Nose: Nose normal.  Eyes:     Conjunctiva/sclera: Conjunctivae normal.     Pupils: Pupils are equal, round, and reactive to light.  Cardiovascular:     Rate and Rhythm: Regular rhythm.     Heart sounds: S1 normal and S2 normal. No murmur. No friction rub. No gallop.   Pulmonary:     Effort: Pulmonary effort is normal. No respiratory distress.  Breath sounds: Normal breath sounds.  Chest:     Chest wall: No tenderness.  Abdominal:     General: Bowel sounds are normal.     Palpations: Abdomen is soft.     Tenderness: There is no abdominal tenderness. There is no guarding or rebound. Negative signs include Murphy's sign and McBurney's sign.     Hernia: No hernia is present.  Musculoskeletal:        General: Normal range of motion.     Cervical back: Normal range of motion and neck supple.  Skin:    General: Skin is warm and dry.     Findings: No rash.  Neurological:     Mental Status: He is alert and oriented to person, place, and time.     GCS: GCS eye subscore is 4. GCS verbal subscore is 5. GCS motor subscore is 6.     Cranial Nerves: No cranial nerve deficit.     Sensory: No sensory deficit.     Coordination: Coordination normal.  Psychiatric:        Thought Content: Thought content is paranoid and delusional.     ED Results / Procedures / Treatments   Labs (all labs ordered are listed, but only abnormal results are displayed) Labs Reviewed  CBG MONITORING, ED - Abnormal; Notable for the following components:      Result Value   Glucose-Capillary 279 (*)    All other components within normal limits  RESPIRATORY PANEL BY RT PCR (FLU A&B, COVID)  COMPREHENSIVE METABOLIC PANEL  ETHANOL  RAPID URINE  DRUG SCREEN, HOSP PERFORMED  CBC WITH DIFFERENTIAL/PLATELET  URINALYSIS, ROUTINE W REFLEX MICROSCOPIC    EKG EKG Interpretation  Date/Time:  Saturday August 04 2019 02:09:36 EDT Ventricular Rate:  118 PR Interval:    QRS Duration: 123 QT Interval:  350 QTC Calculation: 491 R Axis:   73 Text Interpretation: Sinus tachycardia Multiple ventricular premature complexes Nonspecific intraventricular conduction delay Anterior infarct, old Confirmed by Orpah Greek [phone]) on 08/04/2019 2:14:20 AM   Radiology No results found.  Procedures Procedures (including critical care time)  Medications Ordered in ED Medications - No data to display  ED Course  I have reviewed the triage vital signs and the nursing notes.  Pertinent labs & imaging results that were available during my care of the patient were reviewed by me and considered in my medical decision making (see chart for details).    MDM Rules/Calculators/A&P                      Patient brought to the emergency department under involuntary commitment.  He has a long history of mental health issues.  Patient is acutely paranoid and delusional with hyperreligiosity.  He will require behavioral health evaluation and likely placement.  Final Clinical Impression(s) / ED Diagnoses Final diagnoses:  Psychosis, unspecified psychosis type [facility])    Rx / DC Orders ED Discharge Orders    None       S[place] Oran, Gwenyth Allegra, MD 08/04/19 [phone]

## 2019-08-04 NOTE — ED Notes (Signed)
Pt sitting in chair on top of blanket saying that helps with his magnetic field.

## 2019-08-04 NOTE — Progress Notes (Signed)
This patient has been [facility]. Patient's bed is available now and the facility is ready to accept the patient.  Bed: 151-1 Bed on [facility] [address]  Accepting Provider: Dr.Venkta Chivukula  RN Call for Report: [phone]  Patient is under IVC and will require sheriff transportation.  Stephanie Acre, [place] Social Worker [phone]

## 2019-08-04 NOTE — ED Notes (Signed)
08/04/2019  1200  Not able to find patient's belongings prior to transfer to [facility]. Looked in locker #32, Radio broadcast assistant to ask prior RN Ladona Mow if patient had belongings in room #18. Per Agricultural consultant no belongings were in Room#18. Please call patient's wife if belongings are found.

## 2019-08-04 NOTE — ED Notes (Signed)
Pt given cherry popcicle per request

## 2019-08-04 NOTE — ED Notes (Signed)
Pt given lunch tray.

## 2019-08-04 NOTE — ED Notes (Signed)
Pt wife in room. Pt's 4 rings and necklace given to wife to take home.

## 2019-08-04 NOTE — ED Notes (Signed)
Pt given a blanket, a pillow, and a chair for room per request. Pt requested marijuana, pt made aware that isn't available at the hospital. Pt also requested medication for back pain, RN aware.  Pt ambulated to restroom.

## 2019-08-04 NOTE — Progress Notes (Signed)
08/04/2019  0924 Call Sheriff [phone] for transport to [place]. Left message on machine for transport.

## 2019-08-04 NOTE — ED Notes (Signed)
Pt given phone to call wife.

## 2019-08-04 NOTE — ED Notes (Signed)
Pt taking his 4 rings off at sink to give to his wife. Pt provided a specimen cup to place rings.

## 2019-08-04 NOTE — BH Assessment (Signed)
Tele Assessment Note   Patient Name: Joshua [name] MRN: [id number] Referring Physician: Joseph Berkshire, MD Location of Patient: [facility], [phone] Location of Provider: [place] is an 64 y.o. married male who presents unaccompanied to [facility] ED after being petitioned for involuntary commitment by his son, Joshua [name] [phone]. Affidavit and petition states: "My father has been diagnosed with Schizophrenia and Bipolar Disorder. He is on Invega, Depakote and Risperidone. He has been in an episode for approximately two weeks. He refuses to take his medications including the ones for his diabetes and hypertension using the reason they will harm him. He does not eat, sleep or bathe. Everything he does is part of a ritual in his mind where he is protecting the family from evil. Tonight he believes he is [employer]. We tried to get him to commit himself but he would not do so. He is so paranoid that he will believe I am part of the plat to do him harm but I had to risk this because his behaviors are escalating to the point that he is risking his health and our safety."  Pt states his family is afraid of him "because I'm the atom bomb." Pt's medical record indicates he is diagnosed with schizoaffective disorder, bipolar type and has a history of delusions. Pt is a poor historian due to grandiose and tangential thought process. Pt enthusiastically tells stories about being [name], various shapes that are meaningful, space aliens and interactions with Event organiser. He is briefly redirectable but then continues to an unrelated topic. When asked if he was suicidal, Pt says he used to die when he went to sleep but now is dead all the time. Pt says he needs to stick holes in his toes because he is diabetic. He denies thoughts of harming others. He says he does take medication but also says be believes some of his medications are poisoning him.  Pt does not give appropriate answer when asked about substance use. Pt's urine drug screen is positive for cannabis.  TTS contacted petitioner/Pt's son Joshua [name] [phone] for collateral information. He confirms the information in the [place] and petition. He says Pt has not been taking his insulin or blood pressure medications. Petitioner states his father has not taken Invega in several months because it reduces manic episodes and "he doesn't like feeling normal." Petitioner knows of no suicidal ideation. He says Pt has not been physically aggressive. He cannot identify any particular stressors that may be bothering Pt.   Petitioner says Pt receives medications from [facility]. He confirms that Pt's last psychiatric hospitalization was in March 2020 at [facility]. He requests Pt be hospitalized at [facility].  Pt is dressed in hospital scrubs, alert and oriented x4. Pt speaks in a clear tone, at moderate volume and normal pace. Motor behavior appears normal. Eye contact is good. Pt's mood is euphoric and suspicious; affect is congruent with mood. Thought process is tangential with flight of ideas. Pt thought process is delusional. Pt says he wants to go home and doesn't want to be admitted to a psychiatric facility.     Diagnosis: F25.0 Schizoaffective disorder, Bipolar type  Past Medical History:  Past Medical History:  Diagnosis Date  . Back pain   . Bipolar disorder ([place])   . Cannabis use disorder, moderate, dependence ([place])   . Chronic pain   . Diabetes mellitus without complication ([place])   . Generalized anxiety  disorder   . Headache   . Hypertension   . Schizoaffective disorder [facility])     Past Surgical History:  Procedure Laterality Date  . APPENDECTOMY  1970    Family History:  Family History  Problem Relation Age of Onset  . Diabetes Mother   . Cancer Mother        unsure what type  . Heart disease Mother   . Hypertension Mother   . Dementia Father   . Diabetes  Brother   . Dementia Maternal Grandfather   . Cancer Paternal Grandmother 40       breast  . Mental illness Neg Hx     Social History:  reports that he has been smoking cigarettes. He has been smoking about 2.00 packs per day. He has never used smokeless tobacco. He reports previous alcohol use. He reports current drug use. Drug: Marijuana.  Additional Social History:  Alcohol / Drug Use Pain Medications: Pt denies abusing medication Prescriptions: Pt denies abusing medication Over the Counter: Denies abusing medication History of alcohol / drug use?: Yes(Per medical record, Pt has a history of cannabis use.) Longest period of sobriety (when/how long): Unknown  CIWA:   COWS:    Allergies:  Allergies  Allergen Reactions  . Imitrex [Sumatriptan] Other (See Comments)    mania    Home Medications: (Not in a hospital admission)   OB/GYN Status:  No LMP for male patient.  General Assessment Data [facility] of Assessment: [facility] TTS Assessment: In system Is this a Tele or Face-to-Face Assessment?: Tele Assessment Is this an Initial Assessment or a Re-assessment for this encounter?: Initial Assessment Patient Accompanied by:: N/A Language Other than English: No Living Arrangements: Other (Comment)(Lives with wife) What gender do you identify as?: Male Marital status: Married [place] name: NA Pregnancy Status: No Living Arrangements: Spouse/significant other, Children Can pt return to current living arrangement?: Yes Admission Status: Involuntary Petitioner: Family member Is patient capable of signing voluntary admission?: No Referral Source: Self/Family/Friend Insurance type: Medicare     Crisis Care Plan Living Arrangements: Spouse/significant other, Children Legal Guardian: Other:(Self) Name of Psychiatrist: Warden/ranger Name of Therapist: None  Education Status Is patient currently in school?: No Is the patient employed, unemployed or receiving disability?: Receiving  disability income  Risk to self with the past 6 months Suicidal Ideation: No Has patient been a risk to self within the past 6 months prior to admission? : No Suicidal Intent: No Has patient had any suicidal intent within the past 6 months prior to admission? : No Is patient at risk for suicide?: No Suicidal Plan?: No Has patient had any suicidal plan within the past 6 months prior to admission? : No Access to Means: No What has been your use of drugs/alcohol within the last 12 months?: Pt has history of cannabis use Previous Attempts/Gestures: No How many times?: 0 Other Self Harm Risks: Pt talks about putting holes in his toes Triggers for Past Attempts: None known Intentional Self Injurious Behavior: None Family Suicide History: Unable to assess Recent stressful life event(s): Other (Comment)(Unknown) Persecutory voices/beliefs?: Yes Depression: Yes Depression Symptoms: Insomnia, Loss of interest in usual pleasures, Feeling angry/irritable Substance abuse history and/or treatment for substance abuse?: No Suicide prevention information given to non-admitted patients: Not applicable  Risk to Others within the past 6 months Homicidal Ideation: No Does patient have any lifetime risk of violence toward others beyond the six months prior to admission? : No Thoughts of Harm to Others: No Current Homicidal  Intent: No Current Homicidal Plan: No Access to Homicidal Means: No Identified Victim: None History of harm to others?: No Assessment of Violence: None Noted Violent Behavior Description: No know history of violence Does patient have access to weapons?: No Criminal Charges Pending?: No Does patient have a court date: No Is patient on probation?: No  Psychosis Hallucinations: None noted Delusions: Grandiose, Persecutory  Mental Status Report Appearance/Hygiene: Disheveled Eye Contact: Good Motor Activity: Freedom of movement Speech: Tangential Level of Consciousness:  Alert Mood: Pleasant, Euphoric, Suspicious Affect: Euphoric Anxiety Level: Minimal Thought Processes: Tangential, Flight of Ideas Judgement: Impaired Orientation: Person, Place, Situation Obsessive Compulsive Thoughts/Behaviors: None  Cognitive Functioning Concentration: Decreased Memory: Unable to Assess Is patient IDD: No Insight: Poor Impulse Control: Poor Appetite: Poor Have you had any weight changes? : Loss Amount of the weight change? (lbs): (Unknown) Sleep: Decreased Total Hours of Sleep: 0 Vegetative Symptoms: Decreased grooming, Not bathing  ADLScreening [facility] Assessment Services) Patient's cognitive ability adequate to safely complete daily activities?: Yes Patient able to express need for assistance with ADLs?: Yes Independently performs ADLs?: Yes (appropriate for developmental age)  Prior Inpatient Therapy Prior Inpatient Therapy: Yes Prior Therapy Dates: 06/2018, multiple admits Prior Therapy Facilty/Provider(s): [facility], other facilities Reason for Treatment: Schizoaffective disorder, bipolar type  Prior Outpatient Therapy Prior Outpatient Therapy: Yes Prior Therapy Dates: Current Prior Therapy Facilty/Provider(s): [facility] Reason for Treatment: Medication management Does patient have an ACCT team?: No Does patient have Intensive In-House Services?  : No Does patient have [facility] services? : Yes Does patient have P4CC services?: No  ADL Screening (condition at time of admission) Patient's cognitive ability adequate to safely complete daily activities?: Yes Is the patient deaf or have difficulty hearing?: No Does the patient have difficulty seeing, even when wearing glasses/contacts?: No Does the patient have difficulty concentrating, remembering, or making decisions?: Yes Patient able to express need for assistance with ADLs?: Yes Does the patient have difficulty dressing or bathing?: No Independently performs ADLs?: Yes (appropriate for developmental  age) Does the patient have difficulty walking or climbing stairs?: No Weakness of Legs: None Weakness of Arms/Hands: None       Abuse/Neglect Assessment (Assessment to be complete while patient is alone) Abuse/Neglect Assessment Can Be Completed: Unable to assess, patient is non-responsive or altered mental status     Advance Directives (For Healthcare) Does Patient Have a Medical Advance Directive?: Unable to assess, patient is non-responsive or altered mental status          Disposition: Gave clinical report to Caroline Sauger, NP who said Pt meets criteria for inpatient psychiatric treatment. Lavell Luster, [facility], states [address]hall is currently at capacity. TTS will contact other facilities for placement. Notified Dr. Joseph [name] and Jobe Marker, RN of disposition.  Disposition Initial Assessment Completed for this Encounter: Yes  This service was provided via telemedicine using a 2-way, interactive audio and video technology.  Names of all persons participating in this telemedicine service and their role in this encounter. Name: Joshua [name] Role: Patient  Name: Angelina Sheriff Role: Petitioner/Pt's son  Name: Storm Frisk, [facility] Role: TTS counselor      Orpah Greek Anson Fret, [facility], [facility] Triage Specialist [phone]  Anson Fret, Orpah Greek 08/04/2019 3:21 AM

## 2019-08-04 NOTE — ED Notes (Signed)
Pt wife leaving her number in case they locate pt belongings bag that includes bibbed overalls, deer pajama pants, and shoes. [phone]
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
## 2019-08-04 NOTE — Discharge Instructions (Signed)
To [facility]

## 2019-08-04 NOTE — Progress Notes (Signed)
08/04/2019  0920  Glynis Smiles to give report. Per Shirlean Mylar she can not take report until they receive IVC papers. Will notify [place].

## 2019-08-04 NOTE — ED Triage Notes (Signed)
BIB Police due to being IVC. Family IVC's pt due to pt thinking he was God and not bathing and taking care of himself. Family was afraid pt was going to hurt himself or them. PT denies SI/HI at this time and is corporative.

## 2019-08-04 NOTE — Progress Notes (Addendum)
This patient continues to meet inpatient criteria. CSW faxed information to the following facilities:   [facility]- under review 04/10 [facility] does not have an appropriate bed for patient at this time.  Stephanie Acre, [place] Social Worker [phone]

## 2019-08-05 DIAGNOSIS — Z720 Tobacco use: Secondary | ICD-10-CM | POA: Insufficient documentation

## 2019-08-05 DIAGNOSIS — F17200 Nicotine dependence, unspecified, uncomplicated: Secondary | ICD-10-CM | POA: Insufficient documentation

## 2019-08-14 ENCOUNTER — Other Ambulatory Visit: Payer: Self-pay | Admitting: Critical Care Medicine

## 2019-08-15 MED FILL — traMADol HCL 50 MG TABS: 50 | 12 days supply | Qty: 50 | Fill #0

## 2019-08-22 ENCOUNTER — Telehealth: Payer: Self-pay | Admitting: Family Medicine

## 2019-08-22 NOTE — Telephone Encounter (Signed)
The patient will have to be seen ,  Ok to do video visit add on tomorrow

## 2019-08-22 NOTE — Telephone Encounter (Signed)
Patient called saying that the budesonide-formoterol (SYMBICORT) 160-4.5 MCG/ACT inhaler makes him feel weird and would like something else called in. Patient also states that his gums are staring to hurt and would like to have a Rx for the magic mouth wash called in. Please f/u

## 2019-08-23 ENCOUNTER — Encounter: Payer: Self-pay | Admitting: Critical Care Medicine

## 2019-08-23 ENCOUNTER — Ambulatory Visit: Payer: Medicare Other | Attending: Critical Care Medicine | Admitting: Critical Care Medicine

## 2019-08-23 ENCOUNTER — Other Ambulatory Visit: Payer: Self-pay

## 2019-08-23 DIAGNOSIS — Z720 Tobacco use: Secondary | ICD-10-CM

## 2019-08-23 DIAGNOSIS — J441 Chronic obstructive pulmonary disease with (acute) exacerbation: Secondary | ICD-10-CM

## 2019-08-23 DIAGNOSIS — G894 Chronic pain syndrome: Secondary | ICD-10-CM | POA: Diagnosis not present

## 2019-08-23 DIAGNOSIS — J449 Chronic obstructive pulmonary disease, unspecified: Secondary | ICD-10-CM | POA: Diagnosis not present

## 2019-08-23 DIAGNOSIS — J9801 Acute bronchospasm: Secondary | ICD-10-CM

## 2019-08-23 MED ORDER — MAGIC MOUTHWASH W/LIDOCAINE: 5.0000 mL | Freq: Four times a day (QID) | ORAL | 0 refills | Status: DC | PRN

## 2019-08-23 MED ORDER — VENTOLIN HFA 108 (90 BASE) MCG/ACT IN AERS: 2.0000 | INHALATION_SPRAY | RESPIRATORY_TRACT | 3 refills | Status: DC | PRN

## 2019-08-23 MED ORDER — FLUTICASONE-SALMETEROL 250-50 MCG/DOSE IN AEPB: 1.0000 | INHALATION_SPRAY | Freq: Two times a day (BID) | RESPIRATORY_TRACT | 6 refills | Status: DC

## 2019-08-23 NOTE — Assessment & Plan Note (Signed)
Smoking cessation counseling given at this visit

## 2019-08-23 NOTE — Progress Notes (Signed)
Symbicort gives him a weird feeling. He has stopped the medication and only taking ventolin.

## 2019-08-23 NOTE — Assessment & Plan Note (Signed)
Plan will be to change to Advair 251 inhalation twice daily discontinue Symbicort and renew Ventolin inhaler

## 2019-08-23 NOTE — Assessment & Plan Note (Signed)
Patient will obtain a urine drug screen to continue the pain contract and tramadol was refilled

## 2019-08-23 NOTE — Progress Notes (Signed)
Subjective:    Patient ID: Joshua [name], male    DOB: 11/27/1955, 64 y.o.   MRN: [id number]  Virtual Visit via Video Note  I connected with [facility] on 08/23/19 at  9:00 AM EDT by video  and verified that I am speaking with the correct person using two identifiers.   Consent:  I discussed the limitations, risks, security and privacy concerns of performing an evaluation and management service by video and the availability of in person appointments. I also discussed with the patient that there may be a patient responsible charge related to this service. The patient expressed understanding and agreed to proceed.  Location of patient: The patient was at home  Location of provider: I was in my office  Persons participating in the televisit with the patient.  No one else on the call    History of Present Illness: This is a video telehealth visit for a patient who has underlying COPD and was needing further follow-up upon referral from the patient's primary care provider.  The patient's been on several rounds of prednisone antibiotics with only improvement while on prednisone.  He states he is short of breath at rest and with exertion.  He has had problems with breathing for many years got worse over the past year.  He also has underlying diabetes on Trulicity and insulin long-acting.  He also states he has chronic low back pain and has difficulty ambulating this affects his breathing as well.  His weight is gone up as well which is aggravated his chronic pain.  He had a pain management doctor but has not seen that doctor since November 2019.  The patient is on tramadol from an urgent care visit he states this is helped to some degree.  He states nonsteroidals and gabapentin have not been of use in the past.  He also states that he has increased fluid retention and is questioning whether his losartan can be adjusted to accommodate reduction in fluid retention.  He still smokes 2 pack a  day of cigarettes and previous smoked as much as 4 packs a day.  He has not had a pulmonary function test.  Chest x-ray in July of this year showed no active disease.  The patient does have a Symbicort inhaler and is using this however only on an as needed basis along with albuterol which is as needed  Note when I tried to do a medication reconciliation the patient was not clear on which medicines he was taking and which ones he had stopped and he has a list of nearly 40 medicines in his E HR.  04/10/2019 This is a follow-up visit from a video visit that occurred recently.  The patient was given doxycycline and pulse prednisone for COPD exacerbation and is maintaining Symbicort daily.  The patient was referred for the COPD evaluation.  I also refill the patient's tramadol at the last visit.  He has pending pulmonary function test which have yet to be obtained.  He does state that his back is improved on the tramadol.  He also states that he has broken teeth and they will bite into his gum and mouth causing pain in the mouth.  The tramadol helps to some degree but is requesting a mouthwash.  He has not seen a dentist recently.  Patient is still smoking 2 packs a day of cigarettes.  The patient did bring all his current active medications in with him today and I did a  complete medication reconciliation see is current new medication list  04/17/2019 Video visit request re dyspnea Copd  This patient was last seen in person on December 15 and since that time the patient has had recurrent exacerbation of COPD.  He has been using his Symbicort daily.  He states he awoke with acute shortness of breath and lung congestion.  This occurred 2 days ago.  He also had increased pain in the right side.  He has been coughing up profuse thick yellow mucus.  He is using the flutter valve and the nebulizer which helps to remove some of the mucus.  His mouth pain is improved with the Dukes mouthwash and would like a  refill.  He is also out of his tramadol and is yet to achieve a chronic pain management appointment.  There is no fever.  Note he just had a negative Covid test December 18 in preparation for pulmonary function studies which have yet to be scheduled  1/18: This is a follow-up from previous visit in December that was a video visit.  The patient did receive a round of Levaquin and prednisone and he improved to some degree.  He only has albuterol for his nebulizer and is about to run out.  The patient worsened since he ran out of prednisone and the antibiotic.  He still has a lot of phlegm that is difficult to raise.  He is not using his flutter valve every day.  He continues to smoke 1 pack a day of cigarettes but previously was at 2 packs a day The patient notes pressure in the frontal and sinus area and he denies any ear pain  Note his GAD-7 score is 6 and PHQ-9 score is 9 today  He states he cannot afford the co-pay at this time of the year with his insurance.  He has run out of insulin for the past 10 days and would prefer to have his medications filled at our pharmacy  The patient also had a referral to [facility] pain center but they declined his case.  Patient's been taking 50 mg 3 times daily of the tramadol  06/28/2019 Telephone visit: Since the last visit the patient states he still having shortness of breath but his cough and mucus production as well as breathing are better since taking the course of antibiotics and prednisone.  He is now down to 1 pack a day from 3 packs a day of cigarettes.  He states he still has no energy.  He states his lower back pain is under reasonable control with tramadol.  The patient is on Trulicity and insulin glargine for his diabetes but has run out of testing strips.  He also wishes a refill on his Maxalt for his migraines.  The patient maintains his inhaled medications without changes.  08/23/2019 Video visit:   This patient is seen by way of a video visit  in follow-up and notes he recently was hospitalized in [facility] for bipolar exacerbation.  The patient continues to complain of some headaches.  He was started on Invega injections monthly and is maintaining this with improved control of bipolar.  He does state when he uses a Symbicort inhaler it causes his bipolar to be triggered.  We discussed the issue of high-dose steroids and the inhaler may be a trigger for him.  He states his cough and shortness of breath are at baseline.  He states when he was on Advair in the past it did not cause the  same symptom complex.  He is coughing up some slight yellow mucus but denies any wheezing.  He is not taking his Basaglar insulin but is maintaining Trulicity weekly.  He states his fasting sugars are 100-90 and his postprandial are in the 160 range  He knows he is due a drug screen with the pain contract he signed with me to provide him the tramadol and he has an upcoming primary care visit in the next week  COPD He complains of cough, shortness of breath and sputum production. There is no chest tightness, difficulty breathing, frequent throat clearing, hemoptysis, hoarse voice or wheezing. This is a chronic problem. The current episode started more than 1 year ago. The problem occurs every several days. The problem has been rapidly improving. The cough is productive of sputum. Associated symptoms include dyspnea on exertion, malaise/fatigue and orthopnea. Pertinent negatives include no appetite change, chest pain, ear congestion, fever, headaches, heartburn, myalgias, nasal congestion, PND, postnasal drip, rhinorrhea, sneezing, sore throat, sweats or trouble swallowing. His symptoms are aggravated by any activity, change in weather, climbing stairs, lying down, exposure to fumes, strenuous activity, emotional stress and minimal activity. His symptoms are alleviated by beta-agonist, oral steroids and steroid inhaler. Risk factors for lung disease include  smoking/tobacco exposure. His past medical history is significant for COPD.   Past Medical History:  Diagnosis Date  . Back pain   . Bipolar disorder ([place])   . Cannabis use disorder, moderate, dependence ([place])   . Chronic pain   . Diabetes mellitus without complication ([place])   . Generalized anxiety disorder   . Headache   . Hypertension   . Schizoaffective disorder ([place])      Family History  Problem Relation Age of Onset  . Diabetes Mother   . Cancer Mother        unsure what type  . Heart disease Mother   . Hypertension Mother   . Dementia Father   . Diabetes Brother   . Dementia Maternal Grandfather   . Cancer Paternal Grandmother 16       breast  . Mental illness Neg Hx      Social History   Socioeconomic History  . Marital status: Married    Spouse name: Not on file  . Number of children: Not on file  . Years of education: Not on file  . Highest education level: Not on file  Occupational History  . Not on file  Tobacco Use  . Smoking status: Current Every Day Smoker    Packs/day: 2.00    Types: Cigarettes  . Smokeless tobacco: Never Used  Substance and Sexual Activity  . Alcohol use: Not Currently  . Drug use: Yes    Types: Marijuana    Comment: "have changed to hemp now" 09/17/17  . Sexual activity: Not on file  Other Topics Concern  . Not on file  Social History Narrative  . Not on file   Social Determinants of Health   Financial Resource Strain:   . Difficulty of Paying Living Expenses:   Food Insecurity:   . Worried About Charity fundraiser in the Last Year:   . Arboriculturist in the Last Year:   Transportation Needs:   . Film/video editor (Medical):   Marland Kitchen Lack of Transportation (Non-Medical):   Physical Activity:   . Days of Exercise per Week:   . Minutes of Exercise per Session:   Stress:   . Feeling of Stress :  Social Connections:   . Frequency of Communication with Friends and Family:   . Frequency of Social Gatherings with  Friends and Family:   . Attends Religious Services:   . Active Member of Clubs or Organizations:   . Attends Archivist Meetings:   Marland Kitchen Marital Status:   Intimate Partner Violence:   . Fear of Current or Ex-Partner:   . Emotionally Abused:   Marland Kitchen Physically Abused:   . Sexually Abused:      Allergies  Allergen Reactions  . Imitrex [Sumatriptan] Other (See Comments)    mania     Outpatient Medications Prior to Visit  Medication Sig Dispense Refill  . albuterol (PROVENTIL) (2.5 MG/3ML) 0.083% nebulizer solution Take 3 mLs (2.5 mg total) by nebulization every 6 (six) hours as needed for wheezing or shortness of breath. 75 mL 1  . blood glucose meter kit and supplies KIT Dispense based on patient and insurance preference. Use up to four times daily as directed. ICD-10 E11.65  Z79.4 1 each 0  . blood glucose meter kit and supplies Dispense based on patient and insurance preference. Use up to four times daily as directed. (FOR ICD-10 E10.9, E11.9). 1 each 0  . Blood Glucose Monitoring Suppl (TRUE METRIX METER) w/Device KIT Check blood sugars fasting and at bedtime 1 kit 0  . diclofenac (VOLTAREN) 50 MG EC tablet Take 1 tablet (50 mg total) by mouth 2 (two) times daily as needed for moderate pain. Eat before taking medication 60 tablet 1  . divalproex (DEPAKOTE ER) 500 MG 24 hr tablet Take 3 tablets (1,500 mg total) by mouth at bedtime. 90 tablet 11  . Dulaglutide (TRULICITY) 3.54 TG/2.5WL SOPN ADMINISTER 0.5 ML UNDER THE SKIN 1 TIME A WEEK 2 mL 2  . gabapentin (NEURONTIN) 300 MG capsule Take one pill twice per day and two at bedtime (Patient taking differently: Take 300-600 mg by mouth See admin instructions. Take one pill twice per day and two at bedtime) 120 capsule 5  . glucose blood (TRUE METRIX BLOOD GLUCOSE TEST) test strip Use as instructed 100 each 12  . Insulin Glargine (BASAGLAR KWIKPEN) 100 UNIT/ML SOPN Inject 0.35 mLs (35 Units total) into the skin daily. 15 mL 2  . Insulin  Pen Needle (TRUEPLUS PEN NEEDLES) 32G X 4 MM MISC Use as directed to inject insulin 100 each 11  . losartan-hydrochlorothiazide (HYZAAR) 100-25 MG tablet Take 1 tablet by mouth daily. 90 tablet 3  . Respiratory Therapy Supplies (FLUTTER) DEVI Use with 4 times daily 1 each 0  . rizatriptan (MAXALT) 10 MG tablet Take 1 tablet (10 mg total) by mouth as needed for migraine. May repeat in 2 hours if needed 10 tablet 0  . Spacer/Aero-Holding Chambers (AEROCHAMBER MV) inhaler Use as instructed 1 each 0  . tizanidine (ZANAFLEX) 2 MG capsule One or two at bedtime to help with headache/muscle spasm (Patient taking differently: Take 2-4 mg by mouth at bedtime as needed for muscle spasms. ) 60 capsule 1  . traMADol (ULTRAM) 50 MG tablet Take 1 tablet (50 mg total) by mouth every 6 (six) hours as needed for moderate pain or severe pain. 50 tablet 0  . TRUEplus Lancets 28G MISC Use to measure blood sugar twice a day 100 each 1  . magic mouthwash w/lidocaine SOLN Take 5 mLs by mouth 4 (four) times daily as needed for mouth pain. 240 mL 0  . VENTOLIN HFA 108 (90 Base) MCG/ACT inhaler Inhale 2 puffs into the lungs  every 4 (four) hours as needed. (Patient taking differently: Inhale 2 puffs into the lungs every 4 (four) hours as needed for wheezing or shortness of breath. ) 18 g 11  . paliperidone (INVEGA SUSTENNA) 156 MG/ML SUSY injection Inject 1 mL (156 mg total) into the muscle once for 1 dose. Due 5/1 1 mL 11  . budesonide-formoterol (SYMBICORT) 160-4.5 MCG/ACT inhaler Inhale 2 puffs into the lungs 2 (two) times daily. (Patient not taking: Reported on 08/23/2019) 1 Inhaler 3   No facility-administered medications prior to visit.    Review of Systems  Constitutional: Positive for malaise/fatigue. Negative for appetite change and fever.  HENT: Negative for hoarse voice, postnasal drip, rhinorrhea, sneezing, sore throat and trouble swallowing.   Respiratory: Positive for cough, sputum production and shortness of  breath. Negative for hemoptysis and wheezing.   Cardiovascular: Positive for dyspnea on exertion. Negative for chest pain and PND.  Gastrointestinal: Negative for heartburn.  Musculoskeletal: Negative for myalgias.  Neurological: Negative for headaches.   Constitutional:   No  weight loss, night sweats,  Fevers, chills, fatigue, lassitude. HEENT:    headaches,  Difficulty swallowing,  Tooth/dental problems,  Sore throat,                No sneezing, itching, ear ache, nasal congestion, post nasal drip,   CV:  No chest pain,  Orthopnea, PND, swelling in lower extremities, anasarca, dizziness, palpitations  GI  No heartburn, indigestion, abdominal pain, nausea, vomiting, diarrhea, change in bowel habits, loss of appetite  Resp:  shortness of breath with exertion or at rest.  No excess mucus,  productive cough,  No non-productive cough,  No coughing up of blood.  No change in color of mucus.  No wheezing.  No chest wall deformity  Skin: no rash or lesions.  GU: no dysuria, change in color of urine, no urgency or frequency.  No flank pain.  MS:   joint pain or swelling.  No decreased range of motion.   back pain.  Psych:  No change in mood or affect. No depression or anxiety.  No memory loss.      Objective:   Physical Exam  There is no physical exam however on video the patient is calm awake and alert and in no acute distress  Opioid Risk  08/23/2019  Alcohol 0  Illegal Drugs 0  Rx Drugs 0  Alcohol 0  Illegal Drugs 0  Rx Drugs 5  Age between 16-45 years  0  History of Preadolescent Sexual Abuse 0  Psychological Disease 2  Opioid Risk Tool Scoring 7  Opioid Risk Interpretation Moderate Risk       Assessment & Plan:  I personally reviewed all images and lab data in the [facility] system as well as any outside material available during this office visit and agree with the  radiology impressions.   COPD with chronic bronchitis ([place]) Plan will be to change to Advair 251 inhalation  twice daily discontinue Symbicort and renew Ventolin inhaler  Chronic pain syndrome Patient will obtain a urine drug screen to continue the pain contract and tramadol was refilled  Tobacco use Smoking cessation counseling given at this visit   Nicholad was seen today for copd.  Diagnoses and all orders for this visit:  Chronic pain syndrome -     [id number] 11+Oxyco+Alc+Crt-Bund; Future  Bronchospasm -     VENTOLIN HFA 108 (90 Base) MCG/ACT inhaler; Inhale 2 puffs into the lungs every 4 (four) hours as needed for  wheezing or shortness of breath.  COPD with acute exacerbation (HCC) -     VENTOLIN HFA 108 (90 Base) MCG/ACT inhaler; Inhale 2 puffs into the lungs every 4 (four) hours as needed for wheezing or shortness of breath.  COPD with chronic bronchitis ([place])  Tobacco use  Other orders -     Fluticasone-Salmeterol (ADVAIR) 250-50 MCG/DOSE AEPB; Inhale 1 puff into the lungs 2 (two) times daily. -     magic mouthwash w/lidocaine SOLN; Take 5 mLs by mouth 4 (four) times daily as needed for mouth pain.  Will also obtain Magic wash refill for oral issues  Follow Up Instructions: The patient knows a follow-up visit will be made face-to-face end of April   I discussed the assessment and treatment plan with the patient. The patient was provided an opportunity to ask questions and all were answered. The patient agreed with the plan and demonstrated an understanding of the instructions.   The patient was advised to call back or seek an in-person evaluation if the symptoms worsen or if the condition fails to improve as anticipated.  I provided 30 minutes of non-face-to-face time during this encounter  including  median intraservice time , review of notes, labs, imaging, medications  and explaining diagnosis and management to the patient .    Asencion Noble, MD

## 2019-08-30 ENCOUNTER — Ambulatory Visit: Payer: Medicare Other | Attending: Family Medicine | Admitting: Family Medicine

## 2019-08-30 ENCOUNTER — Encounter: Payer: Self-pay | Admitting: Family Medicine

## 2019-08-30 ENCOUNTER — Other Ambulatory Visit: Payer: Self-pay

## 2019-08-30 VITALS — BP 155/85 | HR 89 | Temp 97.9°F | Ht 69.7 in | Wt 257.6 lb

## 2019-08-30 DIAGNOSIS — Z79899 Other long term (current) drug therapy: Secondary | ICD-10-CM | POA: Diagnosis not present

## 2019-08-30 DIAGNOSIS — E119 Type 2 diabetes mellitus without complications: Secondary | ICD-10-CM | POA: Diagnosis present

## 2019-08-30 DIAGNOSIS — F209 Schizophrenia, unspecified: Secondary | ICD-10-CM | POA: Insufficient documentation

## 2019-08-30 DIAGNOSIS — M545 Low back pain, unspecified: Secondary | ICD-10-CM

## 2019-08-30 DIAGNOSIS — J449 Chronic obstructive pulmonary disease, unspecified: Secondary | ICD-10-CM | POA: Diagnosis not present

## 2019-08-30 DIAGNOSIS — F17210 Nicotine dependence, cigarettes, uncomplicated: Secondary | ICD-10-CM | POA: Insufficient documentation

## 2019-08-30 DIAGNOSIS — Z794 Long term (current) use of insulin: Secondary | ICD-10-CM | POA: Diagnosis not present

## 2019-08-30 DIAGNOSIS — G894 Chronic pain syndrome: Secondary | ICD-10-CM | POA: Diagnosis not present

## 2019-08-30 DIAGNOSIS — Z09 Encounter for follow-up examination after completed treatment for conditions other than malignant neoplasm: Secondary | ICD-10-CM

## 2019-08-30 DIAGNOSIS — I10 Essential (primary) hypertension: Secondary | ICD-10-CM | POA: Diagnosis not present

## 2019-08-30 DIAGNOSIS — F1290 Cannabis use, unspecified, uncomplicated: Secondary | ICD-10-CM | POA: Diagnosis not present

## 2019-08-30 DIAGNOSIS — F319 Bipolar disorder, unspecified: Secondary | ICD-10-CM | POA: Diagnosis not present

## 2019-08-30 MED ORDER — GLIMEPIRIDE 2 MG PO TABS: ORAL_TABLET | ORAL | 3 refills | Status: DC

## 2019-08-30 MED ORDER — TRAMADOL HCL 50 MG PO TABS: 50.0000 mg | ORAL_TABLET | Freq: Four times a day (QID) | ORAL | 0 refills | Status: DC | PRN

## 2019-08-30 NOTE — Progress Notes (Signed)
Established Patient Office Visit  Subjective:  Patient ID: NAITIK HERMANN, male    DOB: 02-19-1956  Age: 64 y.o. MRN: [id number]  CC: No chief complaint on file.   HPI YAZEN ROSKO, 64 year old male being seen in follow-up of type 2 diabetes, chronic low back pain with radiation/chronic pain syndrome and patient is status post hospitalization on 08/04/2019 at [facility].  Patient had telemedicine visit on 08/23/2019 in follow-up of his COPD with pulmonologist at this office, Dr. Joya Gaskins.  Patient reports that he was told that he was not yet due for refill of tramadol and would need to follow-up with his primary care provider.  Patient reports he continues to have issues with chronic low back pain with radiation.  Pain is sharp and stabbing and is about an eight on a 0-to-10 scale.  He reports that he is not able to perform his usual activities such as walking or standing for long periods due to his pain.  He reports the tramadol does decrease his pain and allows him to be more active.         He reports that his diabetes medications make him feel sick.  Patient reports that he would like to stop his diabetes medications.  He states that his home blood sugars are normally in the low 100s or less fasting.  He does not feel that he has had significant hypoglycemic episodes but does not feel well after taking his current diabetes medications.  He believes that he took glimepiride in the past at a low dose which controlled his blood sugars without causing him to feel bad.  Past Medical History:  Diagnosis Date  . Back pain   . Bipolar disorder ([place])   . Cannabis use disorder, moderate, dependence ([place])   . Chronic pain   . Diabetes mellitus without complication ([place])   . Generalized anxiety disorder   . Headache   . Hypertension   . Schizoaffective disorder [facility])     Past Surgical History:  Procedure Laterality Date  . APPENDECTOMY  1970    Family  History  Problem Relation Age of Onset  . Diabetes Mother   . Cancer Mother        unsure what type  . Heart disease Mother   . Hypertension Mother   . Dementia Father   . Diabetes Brother   . Dementia Maternal Grandfather   . Cancer Paternal Grandmother 11       breast  . Mental illness Neg Hx     Social History   Socioeconomic History  . Marital status: Married    Spouse name: Not on file  . Number of children: Not on file  . Years of education: Not on file  . Highest education level: Not on file  Occupational History  . Not on file  Tobacco Use  . Smoking status: Current Every Day Smoker    Packs/day: 2.00    Types: Cigarettes  . Smokeless tobacco: Never Used  Substance and Sexual Activity  . Alcohol use: Not Currently  . Drug use: Yes    Types: Marijuana    Comment: "have changed to hemp now" 09/17/17  . Sexual activity: Not on file  Other Topics Concern  . Not on file  Social History Narrative  . Not on file   Social Determinants of Health   Financial Resource Strain:   . Difficulty of Paying Living Expenses:   Food Insecurity:   . Worried  About Running Out of Food in the Last Year:   . [place] in the Last Year:   Transportation Needs:   . Lack of Transportation (Medical):   Marland Kitchen Lack of Transportation (Non-Medical):   Physical Activity:   . Days of Exercise per Week:   . Minutes of Exercise per Session:   Stress:   . Feeling of Stress :   Social Connections:   . Frequency of Communication with Friends and Family:   . Frequency of Social Gatherings with Friends and Family:   . Attends Religious Services:   . Active Member of Clubs or Organizations:   . Attends Archivist Meetings:   Marland Kitchen Marital Status:   Intimate Partner Violence:   . Fear of Current or Ex-Partner:   . Emotionally Abused:   Marland Kitchen Physically Abused:   . Sexually Abused:     Outpatient Medications Prior to Visit  Medication Sig Dispense Refill  . albuterol (PROVENTIL)  (2.5 MG/3ML) 0.083% nebulizer solution Take 3 mLs (2.5 mg total) by nebulization every 6 (six) hours as needed for wheezing or shortness of breath. 75 mL 1  . blood glucose meter kit and supplies KIT Dispense based on patient and insurance preference. Use up to four times daily as directed. ICD-10 E11.65  Z79.4 1 each 0  . blood glucose meter kit and supplies Dispense based on patient and insurance preference. Use up to four times daily as directed. (FOR ICD-10 E10.9, E11.9). 1 each 0  . Blood Glucose Monitoring Suppl (TRUE METRIX METER) w/Device KIT Check blood sugars fasting and at bedtime 1 kit 0  . diclofenac (VOLTAREN) 50 MG EC tablet Take 1 tablet (50 mg total) by mouth 2 (two) times daily as needed for moderate pain. Eat before taking medication 60 tablet 1  . divalproex (DEPAKOTE ER) 500 MG 24 hr tablet Take 3 tablets (1,500 mg total) by mouth at bedtime. 90 tablet 11  . Dulaglutide (TRULICITY) 1.95 KD/3.2IZ SOPN ADMINISTER 0.5 ML UNDER THE SKIN 1 TIME A WEEK 2 mL 2  . Fluticasone-Salmeterol (ADVAIR) 250-50 MCG/DOSE AEPB Inhale 1 puff into the lungs 2 (two) times daily. 1 each 6  . gabapentin (NEURONTIN) 300 MG capsule Take one pill twice per day and two at bedtime (Patient taking differently: Take 300-600 mg by mouth See admin instructions. Take one pill twice per day and two at bedtime) 120 capsule 5  . glucose blood (TRUE METRIX BLOOD GLUCOSE TEST) test strip Use as instructed 100 each 12  . Insulin Glargine (BASAGLAR KWIKPEN) 100 UNIT/ML SOPN Inject 0.35 mLs (35 Units total) into the skin daily. 15 mL 2  . Insulin Pen Needle (TRUEPLUS PEN NEEDLES) 32G X 4 MM MISC Use as directed to inject insulin 100 each 11  . losartan-hydrochlorothiazide (HYZAAR) 100-25 MG tablet Take 1 tablet by mouth daily. 90 tablet 3  . magic mouthwash w/lidocaine SOLN Take 5 mLs by mouth 4 (four) times daily as needed for mouth pain. 240 mL 0  . Respiratory Therapy Supplies (FLUTTER) DEVI Use with 4 times daily 1  each 0  . rizatriptan (MAXALT) 10 MG tablet Take 1 tablet (10 mg total) by mouth as needed for migraine. May repeat in 2 hours if needed 10 tablet 0  . Spacer/Aero-Holding Chambers (AEROCHAMBER MV) inhaler Use as instructed 1 each 0  . tizanidine (ZANAFLEX) 2 MG capsule One or two at bedtime to help with headache/muscle spasm (Patient taking differently: Take 2-4 mg by mouth at bedtime as  needed for muscle spasms. ) 60 capsule 1  . TRUEplus Lancets 28G MISC Use to measure blood sugar twice a day 100 each 1  . VENTOLIN HFA 108 (90 Base) MCG/ACT inhaler Inhale 2 puffs into the lungs every 4 (four) hours as needed for wheezing or shortness of breath. 18 g 3  . traMADol (ULTRAM) 50 MG tablet Take 1 tablet (50 mg total) by mouth every 6 (six) hours as needed for moderate pain or severe pain. 50 tablet 0  . paliperidone (INVEGA SUSTENNA) 156 MG/ML SUSY injection Inject 1 mL (156 mg total) into the muscle once for 1 dose. Due 5/1 1 mL 11   No facility-administered medications prior to visit.    Allergies  Allergen Reactions  . Imitrex [Sumatriptan] Other (See Comments)    mania    ROS Review of Systems  Constitutional: Positive for fatigue. Negative for chills and fever.  HENT: Negative for sore throat and trouble swallowing.   Respiratory: Positive for shortness of breath (at times). Negative for cough.   Cardiovascular: Negative for chest pain and palpitations.  Gastrointestinal: Negative for abdominal pain, constipation, diarrhea and nausea.  Endocrine: Negative for polydipsia, polyphagia and polyuria.  Genitourinary: Negative for dysuria and frequency.  Musculoskeletal: Positive for back pain and gait problem.  Skin: Negative for rash and wound.  Neurological: Negative for dizziness and headaches.  Hematological: Negative for adenopathy. Does not bruise/bleed easily.  Psychiatric/Behavioral: Negative for self-injury.      Objective:    Physical Exam  Constitutional: He is oriented  to person, place, and time. He appears well-developed and well-nourished.  WNWD obese older male in NAD sitting on a chair in the exam room  Neck: No JVD present.  Pulmonary/Chest: Effort normal and breath sounds normal.  Abdominal: Soft. There is no abdominal tenderness. There is no rebound and no guarding.  Musculoskeletal:        General: Tenderness (lumbosacral tenderness to palpation) and edema (mild distal LE edema) present.     Cervical back: Normal range of motion and neck supple.  Lymphadenopathy:    He has no cervical adenopathy.  Neurological: He is alert and oriented to person, place, and time.  Skin: Skin is warm and dry.  No active skin breakdown of the feet  Psychiatric: He has a normal mood and affect. His behavior is normal.  At baseline for patient  Nursing note and vitals reviewed.   BP (!) 155/85   Pulse 89   Temp 97.9 F (36.6 C) (Temporal)   Ht 5' 9.7" (1.77 m)   Wt 257 lb 9.6 oz (116.8 kg)   SpO2 98%   BMI 37.28 kg/m  Wt Readings from Last 3 Encounters:  08/30/19 257 lb 9.6 oz (116.8 kg)  07/20/19 249 lb 9.6 oz (113.2 kg)  05/14/19 248 lb (112.5 kg)     Health Maintenance Due  Topic Date Due  . Hepatitis C Screening  Never done  . PNEUMOCOCCAL POLYSACCHARIDE VACCINE AGE 21-64 HIGH RISK  Never done  . FOOT EXAM  Never done  . OPHTHALMOLOGY EXAM  Never done  . COVID-19 Vaccine (1) Never done  . COLONOSCOPY  Never done     Lab Results  Component Value Date   TSH 1.140 08/28/2018   Lab Results  Component Value Date   WBC 6.2 08/04/2019   HGB 14.1 08/04/2019   HCT 41.8 08/04/2019   MCV 96.1 08/04/2019   PLT 151 08/04/2019   Lab Results  Component Value Date  NA 131 (L) 08/04/2019   K 4.8 08/04/2019   CO2 22 08/04/2019   GLUCOSE 278 (H) 08/04/2019   BUN 7 (L) 08/04/2019   CREATININE 1.07 08/04/2019   BILITOT 1.8 (H) 08/04/2019   ALKPHOS 76 08/04/2019   AST 25 08/04/2019   ALT 18 08/04/2019   PROT 6.6 08/04/2019   ALBUMIN 3.7  08/04/2019   CALCIUM 8.7 (L) 08/04/2019   ANIONGAP 12 08/04/2019   Lab Results  Component Value Date   CHOL 144 07/20/2019   Lab Results  Component Value Date   HDL 32 (L) 07/20/2019   Lab Results  Component Value Date   LDLCALC 72 07/20/2019   Lab Results  Component Value Date   TRIG 246 (H) 07/20/2019   Lab Results  Component Value Date   CHOLHDL 4.5 07/20/2019   Lab Results  Component Value Date   HGBA1C 7.1 (A) 07/20/2019      Assessment & Plan:  1. Type 2 diabetes mellitus without complication, with long-term current use of insulin [facility]) Patient with complaint of his diabetes medication causing him to not feel well.  He reports that his blood sugars have been fairly well controlled.  He reports he was on glimepiride/Amaryl in the past and reviewed with the patient that this medication can cause hypoglycemia if he does not eat soon after taking the medication.  He is willing to try Amaryl at 1 mg before the first meal of the day and to continue monitoring his blood sugars but notify me if he feels better after taking the medication or has hypoglycemic type symptoms such as feeling shaky/sweaty or confused. - glimepiride (AMARYL) 2 MG tablet; 1/2 pill (1 mg) once per day before the first meal of the day to control blood sugar  Dispense: 30 tablet; Refill: 3  2. Chronic pain syndrome 3. Low back pain with radiation He has issues with chronic low back pain with radiation. He will have urine drug screen in follow-up of his long time use of tramadol for treatment of chronic pain.  - Drug Screen, Urine *It does not appear that patient performed urine drug test at today's visit therefore prior to future refills, he may need to come into the office and have serum drug screen.  4. Hospital discharge follow-up He is status post hospitalization, on 08/04/2019 for psychosis.  He is to continue follow-up with psychiatry regarding his schizophrenia. He will be receiving  Invega  injections which per the discharge summary he has received in the past and done well with prior use. Patient also with chronic marijuana use.    An After Visit Summary was printed and given to the patient.   Follow-up: Return for COPD/DM- keep upcoming appt with Dr. Joya Gaskins.  More than 30 minutes of face-to-face time was spent with the patient at today's visit.  Additional 11 minutes spent on review of chart and creation of today's visit note.  Antony Blackbird, MD

## 2019-08-30 NOTE — Progress Notes (Signed)
HFU  257.6lb  Per pt he cant take insulin because it's making him sick  Per pt his insulin is making him sick  cbg is 233

## 2019-09-11 ENCOUNTER — Other Ambulatory Visit: Payer: Self-pay | Admitting: Family Medicine

## 2019-09-18 ENCOUNTER — Encounter: Payer: Self-pay | Admitting: Critical Care Medicine

## 2019-09-18 ENCOUNTER — Other Ambulatory Visit: Payer: Self-pay

## 2019-09-18 ENCOUNTER — Ambulatory Visit: Payer: Medicare Other | Attending: Critical Care Medicine | Admitting: Critical Care Medicine

## 2019-09-18 VITALS — BP 158/84 | HR 83 | Ht 69.0 in | Wt 254.4 lb

## 2019-09-18 DIAGNOSIS — G894 Chronic pain syndrome: Secondary | ICD-10-CM | POA: Insufficient documentation

## 2019-09-18 DIAGNOSIS — F411 Generalized anxiety disorder: Secondary | ICD-10-CM | POA: Diagnosis not present

## 2019-09-18 DIAGNOSIS — Z79899 Other long term (current) drug therapy: Secondary | ICD-10-CM | POA: Insufficient documentation

## 2019-09-18 DIAGNOSIS — Z8249 Family history of ischemic heart disease and other diseases of the circulatory system: Secondary | ICD-10-CM | POA: Diagnosis not present

## 2019-09-18 DIAGNOSIS — Z833 Family history of diabetes mellitus: Secondary | ICD-10-CM | POA: Diagnosis not present

## 2019-09-18 DIAGNOSIS — F319 Bipolar disorder, unspecified: Secondary | ICD-10-CM | POA: Diagnosis not present

## 2019-09-18 DIAGNOSIS — F17210 Nicotine dependence, cigarettes, uncomplicated: Secondary | ICD-10-CM | POA: Insufficient documentation

## 2019-09-18 DIAGNOSIS — F259 Schizoaffective disorder, unspecified: Secondary | ICD-10-CM | POA: Diagnosis not present

## 2019-09-18 DIAGNOSIS — Z794 Long term (current) use of insulin: Secondary | ICD-10-CM | POA: Diagnosis not present

## 2019-09-18 DIAGNOSIS — M5442 Lumbago with sciatica, left side: Secondary | ICD-10-CM | POA: Diagnosis not present

## 2019-09-18 DIAGNOSIS — Z7951 Long term (current) use of inhaled steroids: Secondary | ICD-10-CM | POA: Diagnosis not present

## 2019-09-18 DIAGNOSIS — Z791 Long term (current) use of non-steroidal anti-inflammatories (NSAID): Secondary | ICD-10-CM | POA: Insufficient documentation

## 2019-09-18 DIAGNOSIS — I10 Essential (primary) hypertension: Secondary | ICD-10-CM | POA: Insufficient documentation

## 2019-09-18 DIAGNOSIS — J449 Chronic obstructive pulmonary disease, unspecified: Secondary | ICD-10-CM | POA: Diagnosis present

## 2019-09-18 DIAGNOSIS — E119 Type 2 diabetes mellitus without complications: Secondary | ICD-10-CM | POA: Insufficient documentation

## 2019-09-18 LAB — GLUCOSE, POCT (MANUAL RESULT ENTRY): POC Glucose: 211 mg/dl — AB (ref 70–99)

## 2019-09-18 LAB — POCT GLYCOSYLATED HEMOGLOBIN (HGB A1C): HbA1c, POC (controlled diabetic range): 7.3 % — AB (ref 0.0–7.0)

## 2019-09-18 MED ORDER — TRAMADOL HCL 50 MG PO TABS: 50.0000 mg | ORAL_TABLET | Freq: Four times a day (QID) | ORAL | 0 refills | Status: DC | PRN

## 2019-09-18 NOTE — Patient Instructions (Signed)
No change in medications  Refill on tramadol sent to [facility]  Urine Drug screen obtained  Video visit follow up with Dr Joya Gaskins in 2 months

## 2019-09-18 NOTE — Progress Notes (Signed)
Subjective:    Patient ID: Joshua [name], male    DOB: 1955/08/13, 64 y.o.   MRN: [id number]   History of Present Illness: This is a video telehealth visit for a patient who has underlying COPD and was needing further follow-up upon referral from the patient's primary care provider.  The patient's been on several rounds of prednisone antibiotics with only improvement while on prednisone.  He states he is short of breath at rest and with exertion.  He has had problems with breathing for many years got worse over the past year.  He also has underlying diabetes on Trulicity and insulin long-acting.  He also states he has chronic low back pain and has difficulty ambulating this affects his breathing as well.  His weight is gone up as well which is aggravated his chronic pain.  He had a pain management doctor but has not seen that doctor since November 2019.  The patient is on tramadol from an urgent care visit he states this is helped to some degree.  He states nonsteroidals and gabapentin have not been of use in the past.  He also states that he has increased fluid retention and is questioning whether his losartan can be adjusted to accommodate reduction in fluid retention.  He still smokes 2 pack a day of cigarettes and previous smoked as much as 4 packs a day.  He has not had a pulmonary function test.  Chest x-ray in July of this year showed no active disease.  The patient does have a Symbicort inhaler and is using this however only on an as needed basis along with albuterol which is as needed  Note when I tried to do a medication reconciliation the patient was not clear on which medicines he was taking and which ones he had stopped and he has a list of nearly 40 medicines in his E HR.  04/10/2019 This is a follow-up visit from a video visit that occurred recently.  The patient was given doxycycline and pulse prednisone for COPD exacerbation and is maintaining Symbicort daily.  The patient was  referred for the COPD evaluation.  I also refill the patient's tramadol at the last visit.  He has pending pulmonary function test which have yet to be obtained.  He does state that his back is improved on the tramadol.  He also states that he has broken teeth and they will bite into his gum and mouth causing pain in the mouth.  The tramadol helps to some degree but is requesting a mouthwash.  He has not seen a dentist recently.  Patient is still smoking 2 packs a day of cigarettes.  The patient did bring all his current active medications in with him today and I did a complete medication reconciliation see is current new medication list  04/17/2019 Video visit request re dyspnea Copd  This patient was last seen in person on December 15 and since that time the patient has had recurrent exacerbation of COPD.  He has been using his Symbicort daily.  He states he awoke with acute shortness of breath and lung congestion.  This occurred 2 days ago.  He also had increased pain in the right side.  He has been coughing up profuse thick yellow mucus.  He is using the flutter valve and the nebulizer which helps to remove some of the mucus.  His mouth pain is improved with the [facility]ukes mouthwash and would like a refill.  He is also out of  his tramadol and is yet to achieve a chronic pain management appointment.  There is no fever.  Note he just had a negative Covid test December 18 in preparation for pulmonary function studies which have yet to be scheduled  1/18: This is a follow-up from previous visit in December that was a video visit.  The patient did receive a round of Levaquin and prednisone and he improved to some degree.  He only has albuterol for his nebulizer and is about to run out.  The patient worsened since he ran out of prednisone and the antibiotic.  He still has a lot of phlegm that is difficult to raise.  He is not using his flutter valve every day.  He continues to smoke 1 pack a day of cigarettes  but previously was at 2 packs a day The patient notes pressure in the frontal and sinus area and he denies any ear pain  Note his GAD-7 score is 6 and PHQ-9 score is 9 today  He states he cannot afford the co-pay at this time of the year with his insurance.  He has run out of insulin for the past 10 days and would prefer to have his medications filled at our pharmacy  The patient also had a referral to [facility] pain center but they declined his case.  Patient's been taking 50 mg 3 times daily of the tramadol  06/28/2019 Telephone visit: Since the last visit the patient states he still having shortness of breath but his cough and mucus production as well as breathing are better since taking the course of antibiotics and prednisone.  He is now down to 1 pack a day from 3 packs a day of cigarettes.  He states he still has no energy.  He states his lower back pain is under reasonable control with tramadol.  The patient is on Trulicity and insulin glargine for his diabetes but has run out of testing strips.  He also wishes a refill on his Maxalt for his migraines.  The patient maintains his inhaled medications without changes.  09/18/2019 Patient seen in return follow-up and his shortness of breath is much improved.  He is coughing less.  He is smoking less.  He has no new complaints other than chronic back pain   COPD He complains of chest tightness, cough, difficulty breathing, shortness of breath and sputum production. There is no frequent throat clearing, hemoptysis, hoarse voice or wheezing. This is a chronic problem. The current episode started more than 1 year ago. The problem occurs every several days. The problem has been gradually improving. The cough is productive of sputum. Associated symptoms include dyspnea on exertion, malaise/fatigue and orthopnea. Pertinent negatives include no appetite change, chest pain, ear congestion, fever, headaches, heartburn, myalgias, nasal congestion, PND,  postnasal drip, rhinorrhea, sneezing, sore throat, sweats or trouble swallowing. His symptoms are aggravated by any activity, change in weather, climbing stairs, lying down, exposure to fumes, strenuous activity, emotional stress and minimal activity. His symptoms are alleviated by beta-agonist, oral steroids and steroid inhaler. Risk factors for lung disease include smoking/tobacco exposure. His past medical history is significant for COPD.   Past Medical History:  Diagnosis Date  . Back pain   . Bipolar disorder ([place])   . Cannabis use disorder, moderate, dependence ([place])   . Chronic pain   . Diabetes mellitus without complication ([place])   . Generalized anxiety disorder   . Headache   . Hypertension   . Schizoaffective disorder ([place])  Family History  Problem Relation Age of Onset  . Diabetes Mother   . Cancer Mother        unsure what type  . Heart disease Mother   . Hypertension Mother   . Dementia Father   . Diabetes Brother   . Dementia Maternal Grandfather   . Cancer Paternal Grandmother 10       breast  . Mental illness Neg Hx      Social History   Socioeconomic History  . Marital status: Married    Spouse name: Not on file  . Number of children: Not on file  . Years of education: Not on file  . Highest education level: Not on file  Occupational History  . Not on file  Tobacco Use  . Smoking status: Current Every Day Smoker    Packs/day: 2.00    Types: Cigarettes  . Smokeless tobacco: Never Used  Substance and Sexual Activity  . Alcohol use: Not Currently  . Drug use: Yes    Types: Marijuana    Comment: "have changed to hemp now" 09/17/17  . Sexual activity: Not on file  Other Topics Concern  . Not on file  Social History Narrative  . Not on file   Social Determinants of Health   Financial Resource Strain:   . Difficulty of Paying Living Expenses:   Food Insecurity:   . Worried About Charity fundraiser in the Last Year:   . Arboriculturist in the  Last Year:   Transportation Needs:   . Film/video editor (Medical):   Marland Kitchen Lack of Transportation (Non-Medical):   Physical Activity:   . Days of Exercise per Week:   . Minutes of Exercise per Session:   Stress:   . Feeling of Stress :   Social Connections:   . Frequency of Communication with Friends and Family:   . Frequency of Social Gatherings with Friends and Family:   . Attends Religious Services:   . Active Member of Clubs or Organizations:   . Attends Archivist Meetings:   Marland Kitchen Marital Status:   Intimate Partner Violence:   . Fear of Current or Ex-Partner:   . Emotionally Abused:   Marland Kitchen Physically Abused:   . Sexually Abused:      Allergies  Allergen Reactions  . Imitrex [Sumatriptan] Other (See Comments)    mania     Outpatient Medications Prior to Visit  Medication Sig Dispense Refill  . albuterol (PROVENTIL) (2.5 MG/3ML) 0.083% nebulizer solution Take 3 mLs (2.5 mg total) by nebulization every 6 (six) hours as needed for wheezing or shortness of breath. 75 mL 1  . blood glucose meter kit and supplies KIT Dispense based on patient and insurance preference. Use up to four times daily as directed. ICD-10 E11.65  Z79.4 1 each 0  . blood glucose meter kit and supplies Dispense based on patient and insurance preference. Use up to four times daily as directed. (FOR ICD-10 E10.9, E11.9). 1 each 0  . Blood Glucose Monitoring Suppl (TRUE METRIX METER) w/Device KIT Check blood sugars fasting and at bedtime 1 kit 0  . diclofenac (VOLTAREN) 50 MG EC tablet Take 1 tablet (50 mg total) by mouth 2 (two) times daily as needed for moderate pain. Eat before taking medication 60 tablet 1  . divalproex (DEPAKOTE ER) 500 MG 24 hr tablet Take 3 tablets (1,500 mg total) by mouth at bedtime. 90 tablet 11  . Fluticasone-Salmeterol (ADVAIR) 250-50 MCG/DOSE AEPB Inhale  1 puff into the lungs 2 (two) times daily. 1 each 6  . gabapentin (NEURONTIN) 300 MG capsule Take one pill twice per day  and two at bedtime (Patient taking differently: Take 300-600 mg by mouth See admin instructions. Take one pill twice per day and two at bedtime) 120 capsule 5  . glimepiride (AMARYL) 2 MG tablet 1/2 pill (1 mg) once per day before the first meal of the day to control blood sugar 30 tablet 3  . glucose blood (TRUE METRIX BLOOD GLUCOSE TEST) test strip Use as instructed 100 each 12  . Insulin Glargine (BASAGLAR KWIKPEN) 100 UNIT/ML SOPN Inject 0.35 mLs (35 Units total) into the skin daily. 15 mL 2  . Insulin Pen Needle (TRUEPLUS PEN NEEDLES) 32G X 4 MM MISC Use as directed to inject insulin 100 each 11  . losartan-hydrochlorothiazide (HYZAAR) 100-25 MG tablet Take 1 tablet by mouth daily. 90 tablet 3  . magic mouthwash w/lidocaine SOLN Take 5 mLs by mouth 4 (four) times daily as needed for mouth pain. 240 mL 0  . Respiratory Therapy Supplies (FLUTTER) DEVI Use with 4 times daily 1 each 0  . rizatriptan (MAXALT) 10 MG tablet Take 1 tablet (10 mg total) by mouth as needed for migraine. May repeat in 2 hours if needed 10 tablet 0  . Spacer/Aero-Holding Chambers (AEROCHAMBER MV) inhaler Use as instructed 1 each 0  . tizanidine (ZANAFLEX) 2 MG capsule One or two at bedtime to help with headache/muscle spasm (Patient taking differently: Take 2-4 mg by mouth at bedtime as needed for muscle spasms. ) 60 capsule 1  . TRUEplus Lancets 28G MISC Use to measure blood sugar twice a day 355 each 1  . TRULICITY 7.32 KG/2.5KY SOPN ADMINISTER 0.5 ML UNDER THE SKIN 1 TIME A WEEK 2 mL 2  . VENTOLIN HFA 108 (90 Base) MCG/ACT inhaler Inhale 2 puffs into the lungs every 4 (four) hours as needed for wheezing or shortness of breath. 18 g 3  . traMADol (ULTRAM) 50 MG tablet Take 1 tablet (50 mg total) by mouth every 6 (six) hours as needed for moderate pain or severe pain. 50 tablet 0  . lidocaine (XYLOCAINE) 2 % solution     . paliperidone (INVEGA SUSTENNA) 156 MG/ML SUSY injection Inject 1 mL (156 mg total) into the muscle  once for 1 dose. Due 5/1 1 mL 11   No facility-administered medications prior to visit.    Review of Systems  Constitutional: Positive for malaise/fatigue. Negative for appetite change and fever.  HENT: Negative for hoarse voice, postnasal drip, rhinorrhea, sneezing, sore throat and trouble swallowing.   Respiratory: Positive for cough, sputum production and shortness of breath. Negative for hemoptysis and wheezing.   Cardiovascular: Positive for dyspnea on exertion. Negative for chest pain and PND.  Gastrointestinal: Negative for heartburn.  Musculoskeletal: Negative for myalgias.  Neurological: Negative for headaches.   Constitutional:   No  weight loss, night sweats,  Fevers, chills, fatigue, lassitude. HEENT:    headaches,  Difficulty swallowing,  Tooth/dental problems,  Sore throat,                No sneezing, itching, ear ache, nasal congestion, post nasal drip,   CV:  No chest pain,  Orthopnea, PND, swelling in lower extremities, anasarca, dizziness, palpitations  GI  No heartburn, indigestion, abdominal pain, nausea, vomiting, diarrhea, change in bowel habits, loss of appetite  Resp:  shortness of breath with exertion or at rest.  No excess mucus,  productive cough,  No non-productive cough,  No coughing up of blood.  No change in color of mucus.  No wheezing.  No chest wall deformity  Skin: no rash or lesions.  GU: no dysuria, change in color of urine, no urgency or frequency.  No flank pain.  MS:   joint pain or swelling.  No decreased range of motion.   back pain.  Psych:  No change in mood or affect. No depression or anxiety.  No memory loss.      Objective:   Physical Exam   Vitals:   09/18/19 1018  BP: (!) 158/84  Pulse: 83  SpO2: 96%  Weight: 254 lb 6.4 oz (115.4 kg)  Height: '5\' 9"'$  (1.753 m)   Vitals:   09/18/19 1018  BP: (!) 158/84  Pulse: 83  SpO2: 96%  Weight: 254 lb 6.4 oz (115.4 kg)  Height: '5\' 9"'$  (1.753 m)    Gen: Pleasant, well-nourished, in  no distress,  normal affect  ENT: No lesions,  mouth clear,  oropharynx clear, no postnasal drip  Neck: No JVD, no TMG, no carotid bruits  Lungs: No use of accessory muscles, no dullness to percussion, there is distant breath sounds with resolved wheezes  Cardiovascular: RRR, heart sounds normal, no murmur or gallops, no peripheral edema  Abdomen: soft and NT, no HSM,  BS normal  Musculoskeletal: No deformities, no cyanosis or clubbing, lower back is tender in the paraspinal area and lumbar region  Neuro: alert, non focal  Skin: Warm, no lesions or rashes   Opioid Risk  08/23/2019  Alcohol 0  Illegal Drugs 0  Rx Drugs 0  Alcohol 0  Illegal Drugs 0  Rx Drugs 5  Age between 16-45 years  0  History of Preadolescent Sexual Abuse 0  Psychological Disease 2  Opioid Risk Tool Scoring 7  Opioid Risk Interpretation Moderate Risk   CBG 381     Assessment & Plan:  I personally reviewed all images and lab data in the [facility] system as well as any outside material available during this office visit and agree with the  radiology impressions.   COPD with chronic bronchitis (HCC) Chronic obstructive lung disease stable at this time we will continue inhaled medications as prescribed  Chronic low back pain with left-sided sciatica Chronic back pain with chronic pain syndrome  Patient has a controlled substance agreement we will obtain drug screen today and refill the tramadol for 1 month course  Chronic pain syndrome Chronic pain syndrome has checked [facility] drug database no other prescribers and the patient will have tramadol refilled   Joshua [name] was seen today for diabetes and back pain.  Diagnoses and all orders for this visit:  Type 2 diabetes mellitus without complication, with long-term current use of insulin (HCC) -     POCT glucose (manual entry) -     POCT glycosylated hemoglobin (Hb A1C)  Chronic pain syndrome -     [id number] 11+Oxyco+Alc+Crt-Bund  COPD with chronic  bronchitis (HCC)  Other orders -     traMADol (ULTRAM) 50 MG tablet; Take 1 tablet (50 mg total) by mouth every 6 (six) hours as needed for moderate pain or severe pain.

## 2019-09-19 NOTE — Assessment & Plan Note (Signed)
Chronic obstructive lung disease stable at this time we will continue inhaled medications as prescribed

## 2019-09-19 NOTE — Assessment & Plan Note (Signed)
Chronic back pain with chronic pain syndrome  Patient has a controlled substance agreement we will obtain drug screen today and refill the tramadol for 1 month course

## 2019-09-19 NOTE — Assessment & Plan Note (Signed)
Chronic pain syndrome has checked [facility] drug database no other prescribers and the patient will have tramadol refilled

## 2019-09-24 LAB — TRAMADOL GC/MS, URINE
Tramadol gc/ms Conf: 33795 ng/mL
Tramadol: POSITIVE — AB

## 2019-09-24 LAB — DRUG SCREEN 764883 11+OXYCO+ALC+CRT-BUND
Amphetamines, Urine: NEGATIVE ng/mL
BENZODIAZ UR QL: NEGATIVE ng/mL
Barbiturate: NEGATIVE ng/mL
Cocaine (Metabolite): NEGATIVE ng/mL
Creatinine: 119.5 mg/dL (ref 20.0–300.0)
Ethanol: NEGATIVE %
Meperidine: NEGATIVE ng/mL
Methadone Screen, Urine: NEGATIVE ng/mL
OPIATE SCREEN URINE: NEGATIVE ng/mL
Oxycodone/Oxymorphone, Urine: NEGATIVE ng/mL
Phencyclidine: NEGATIVE ng/mL
Propoxyphene: NEGATIVE ng/mL
pH, Urine: 5.9 (ref 4.5–8.9)

## 2019-09-24 LAB — CANNABINOID CONFIRMATION, UR
CANNABINOIDS: POSITIVE — AB
Carboxy THC GC/MS Conf: 297 ng/mL

## 2019-10-22 ENCOUNTER — Telehealth: Payer: Self-pay

## 2019-10-22 DIAGNOSIS — G894 Chronic pain syndrome: Secondary | ICD-10-CM

## 2019-10-22 NOTE — Telephone Encounter (Signed)
1) Medication(s) Requested (by name): TRAMADOL  2) Pharmacy of Choice: [facility] unless provider wants [facility]  3) Special Requests:pt request refill states no remaining   Approved medications will be sent to the pharmacy, we will reach out if there is an issue.  Requests made after 3pm may not be addressed until the following business day!  If a patient is unsure of the name of the medication(s) please note and ask patient to call back when they are able to provide all info, do not send to responsible party until all information is available!

## 2019-10-22 NOTE — Telephone Encounter (Signed)
He received #100 tabs from Dr Joya Gaskins on 09/18/19 - too early for refill.

## 2019-10-24 MED ORDER — TRAMADOL HCL 50 MG PO TABS: 50.0000 mg | ORAL_TABLET | Freq: Four times a day (QID) | ORAL | 0 refills | Status: DC | PRN

## 2019-10-24 NOTE — Telephone Encounter (Signed)
Patient checking on the status of message below, patient informed and states he would like a follow up call from the nurse advising him when rx will be sent in.   traMADol (ULTRAM) 50 MG tablet   [facility] [address]  [place], [place] [address]  Phone:  [phone] Fax:  [phone]

## 2019-10-24 NOTE — Telephone Encounter (Signed)
I have sent the Rx in for the patient

## 2019-11-20 ENCOUNTER — Other Ambulatory Visit: Payer: Self-pay

## 2019-11-20 ENCOUNTER — Telehealth: Payer: Self-pay | Admitting: Infectious Diseases

## 2019-11-20 ENCOUNTER — Encounter: Payer: Self-pay | Admitting: Critical Care Medicine

## 2019-11-20 ENCOUNTER — Ambulatory Visit: Payer: Medicare Other | Attending: Critical Care Medicine | Admitting: Critical Care Medicine

## 2019-11-20 DIAGNOSIS — J9801 Acute bronchospasm: Secondary | ICD-10-CM

## 2019-11-20 DIAGNOSIS — Z794 Long term (current) use of insulin: Secondary | ICD-10-CM

## 2019-11-20 DIAGNOSIS — M5442 Lumbago with sciatica, left side: Secondary | ICD-10-CM | POA: Diagnosis not present

## 2019-11-20 DIAGNOSIS — G43009 Migraine without aura, not intractable, without status migrainosus: Secondary | ICD-10-CM | POA: Diagnosis not present

## 2019-11-20 DIAGNOSIS — J449 Chronic obstructive pulmonary disease, unspecified: Secondary | ICD-10-CM

## 2019-11-20 DIAGNOSIS — J441 Chronic obstructive pulmonary disease with (acute) exacerbation: Secondary | ICD-10-CM

## 2019-11-20 DIAGNOSIS — E1165 Type 2 diabetes mellitus with hyperglycemia: Secondary | ICD-10-CM

## 2019-11-20 DIAGNOSIS — G8929 Other chronic pain: Secondary | ICD-10-CM | POA: Diagnosis not present

## 2019-11-20 MED ORDER — RIZATRIPTAN BENZOATE 10 MG PO TABS: 10.0000 mg | ORAL_TABLET | ORAL | 0 refills | Status: DC | PRN

## 2019-11-20 MED ORDER — MAGIC MOUTHWASH W/LIDOCAINE: 5.0000 mL | Freq: Four times a day (QID) | ORAL | 0 refills | Status: DC | PRN

## 2019-11-20 MED ORDER — LOSARTAN POTASSIUM-HCTZ 100-25 MG PO TABS: 1.0000 | ORAL_TABLET | Freq: Every day | ORAL | 3 refills | Status: DC

## 2019-11-20 MED ORDER — VENTOLIN HFA 108 (90 BASE) MCG/ACT IN AERS: 2.0000 | INHALATION_SPRAY | RESPIRATORY_TRACT | 3 refills | Status: DC | PRN

## 2019-11-20 MED ORDER — TRUE METRIX BLOOD GLUCOSE TEST VI STRP: ORAL_STRIP | 12 refills | Status: DC

## 2019-11-20 NOTE — Assessment & Plan Note (Signed)
Type 2 diabetes with recent glycemic control adequate we will continue Trulicity and Amaryl for now and see the patient back for a face-to-face visit soon

## 2019-11-20 NOTE — Assessment & Plan Note (Signed)
Recent COPD exacerbation stable this time  Asked patient to use Advair on a regular basis as he is only taking it as needed

## 2019-11-20 NOTE — Assessment & Plan Note (Signed)
I will go ahead and refill the Maxalt at this time

## 2019-11-20 NOTE — Assessment & Plan Note (Signed)
Chronic pain syndrome now on tramadol he is not been overusing this medication and the National drug database is checked and is showing no frequent prescribers  He is not yet ready for tramadol refill will need to wait for another week and then will refill this

## 2019-11-20 NOTE — Telephone Encounter (Signed)
Attempted to reach Mr. Salvetti to screen for homebound Moderna vaccine administration.   LVM requesting call back.  MyChart sent as well.

## 2019-11-20 NOTE — Progress Notes (Signed)
Subjective:    Patient ID: Joshua [name], male    DOB: 1955/10/19, 64 y.o.   MRN: [id number]  Virtual Visit via Video Note  I connected with@ on 11/20/19 at@ by a video enabled telemedicine application and verified that I am speaking with the correct person using two identifiers.   Consent:    I discussed the limitations, risks, security and privacy concerns of performing an evaluation and management service by video visit and the availability of in person appointments. I also discussed with the patient that there may be a patient responsible charge related to this service. The patient expressed understanding and agreed to proceed.  Location of patient: Patient is at home  Location of provider: I am in my office  Persons participating in the televisit with the patient.  Patient spouse and son are with him on the visit    This is a video telehealth visit for a patient who has underlying COPD and was needing further follow-up upon referral from the patient's primary care provider.  The patient's been on several rounds of prednisone antibiotics with only improvement while on prednisone.  He states he is short of breath at rest and with exertion.  He has had problems with breathing for many years got worse over the past year.  He also has underlying diabetes on Trulicity and insulin long-acting.  He also states he has chronic low back pain and has difficulty ambulating this affects his breathing as well.  His weight is gone up as well which is aggravated his chronic pain.  He had a pain management doctor but has not seen that doctor since November 2019.  The patient is on tramadol from an urgent care visit he states this is helped to some degree.  He states nonsteroidals and gabapentin have not been of use in the past.  He also states that he has increased fluid retention and is questioning whether his losartan can be adjusted to accommodate reduction in fluid retention.  He still smokes 2  pack a day of cigarettes and previous smoked as much as 4 packs a day.  He has not had a pulmonary function test.  Chest x-ray in July of this year showed no active disease.  The patient does have a Symbicort inhaler and is using this however only on an as needed basis along with albuterol which is as needed  Note when I tried to do a medication reconciliation the patient was not clear on which medicines he was taking and which ones he had stopped and he has a list of nearly 40 medicines in his E HR.  04/10/2019 This is a follow-up visit from a video visit that occurred recently.  The patient was given doxycycline and pulse prednisone for COPD exacerbation and is maintaining Symbicort daily.  The patient was referred for the COPD evaluation.  I also refill the patient's tramadol at the last visit.  He has pending pulmonary function test which have yet to be obtained.  He does state that his back is improved on the tramadol.  He also states that he has broken teeth and they will bite into his gum and mouth causing pain in the mouth.  The tramadol helps to some degree but is requesting a mouthwash.  He has not seen a dentist recently.  Patient is still smoking 2 packs a day of cigarettes.  The patient did bring all his current active medications in with him today and I did a complete medication  reconciliation see is current new medication list  04/17/2019 Video visit request re dyspnea Copd  This patient was last seen in person on December 15 and since that time the patient has had recurrent exacerbation of COPD.  He has been using his Symbicort daily.  He states he awoke with acute shortness of breath and lung congestion.  This occurred 2 days ago.  He also had increased pain in the right side.  He has been coughing up profuse thick yellow mucus.  He is using the flutter valve and the nebulizer which helps to remove some of the mucus.  His mouth pain is improved with the [place]ukes mouthwash and would  like a refill.  He is also out of his tramadol and is yet to achieve a chronic pain management appointment.  There is no fever.  Note he just had a negative Covid test December 18 in preparation for pulmonary function studies which have yet to be scheduled  1/18: This is a follow-up from previous visit in December that was a video visit.  The patient did receive a round of Levaquin and prednisone and he improved to some degree.  He only has albuterol for his nebulizer and is about to run out.  The patient worsened since he ran out of prednisone and the antibiotic.  He still has a lot of phlegm that is difficult to raise.  He is not using his flutter valve every day.  He continues to smoke 1 pack a day of cigarettes but previously was at 2 packs a day The patient notes pressure in the frontal and sinus area and he denies any ear pain  Note his GAD-7 score is 6 and PHQ-9 score is 9 today  He states he cannot afford the co-pay at this time of the year with his insurance.  He has run out of insulin for the past 10 days and would prefer to have his medications filled at our pharmacy  The patient also had a referral to [facility] pain center but they declined his case.  Patient's been taking 50 mg 3 times daily of the tramadol  06/28/2019 Telephone visit: Since the last visit the patient states he still having shortness of breath but his cough and mucus production as well as breathing are better since taking the course of antibiotics and prednisone.  He is now down to 1 pack a day from 3 packs a day of cigarettes.  He states he still has no energy.  He states his lower back pain is under reasonable control with tramadol.  The patient is on Trulicity and insulin glargine for his diabetes but has run out of testing strips.  He also wishes a refill on his Maxalt for his migraines.  The patient maintains his inhaled medications without changes.  09/18/2019 Patient seen in return follow-up and his shortness of  breath is much improved.  He is coughing less.  He is smoking less.  He has no new complaints other than chronic back pain  11/20/2019 This visit is a video telehealth visit for this patient who has chronic lung disease, hypertension, peripheral vascular disease, type 2 diabetes, chronic low back pain.  The patient states last several weeks and been difficult with change in weather being very hot and humid.  The patient notes he has had chest congestion cough and more shortness of breath.  Patient's not been using his Advair on a regular basis.  He is about out of tramadol will need refills soon.  He is requesting a refill on his Maxalt.  He also would like mouthwash because his throat is irritated again.  He is yet to receive the Covid vaccine and is interested in this.  Patient states he is no longer on Basaglar insulin he is on Trulicity and Amaryl  The patient needs more glucose testing supplies  COPD He complains of cough and shortness of breath. There is no chest tightness, difficulty breathing, frequent throat clearing, hemoptysis, hoarse voice, sputum production or wheezing. This is a chronic problem. The current episode started more than 1 year ago. The problem occurs every several days. The problem has been unchanged. The cough is productive of sputum. Associated symptoms include dyspnea on exertion and orthopnea. Pertinent negatives include no appetite change, chest pain, ear congestion, fever, headaches, heartburn, malaise/fatigue, myalgias, nasal congestion, PND, postnasal drip, rhinorrhea, sneezing, sore throat, sweats or trouble swallowing. His symptoms are aggravated by any activity, change in weather, climbing stairs, lying down, exposure to fumes, strenuous activity, emotional stress and minimal activity. His symptoms are alleviated by beta-agonist, oral steroids and steroid inhaler. Risk factors for lung disease include smoking/tobacco exposure. His past medical history is significant  for COPD.   Past Medical History:  Diagnosis Date  . Back pain   . Bipolar disorder ([place])   . Cannabis use disorder, moderate, dependence ([place])   . Chronic pain   . Diabetes mellitus without complication ([place])   . Generalized anxiety disorder   . Headache   . Hypertension   . Schizoaffective disorder ([place])      Family History  Problem Relation Age of Onset  . Diabetes Mother   . Cancer Mother        unsure what type  . Heart disease Mother   . Hypertension Mother   . Dementia Father   . Diabetes Brother   . Dementia Maternal Grandfather   . Cancer Paternal Grandmother 15       breast  . Mental illness Neg Hx      Social History   Socioeconomic History  . Marital status: Married    Spouse name: Not on file  . Number of children: Not on file  . Years of education: Not on file  . Highest education level: Not on file  Occupational History  . Not on file  Tobacco Use  . Smoking status: Current Every Day Smoker    Packs/day: 2.00    Types: Cigarettes  . Smokeless tobacco: Never Used  Vaping Use  . Vaping Use: Never used  Substance and Sexual Activity  . Alcohol use: Not Currently  . Drug use: Yes    Types: Marijuana    Comment: "have changed to hemp now" 09/17/17  . Sexual activity: Not on file  Other Topics Concern  . Not on file  Social History Narrative  . Not on file   Social Determinants of Health   Financial Resource Strain:   . Difficulty of Paying Living Expenses:   Food Insecurity:   . Worried About Charity fundraiser in the Last Year:   . Arboriculturist in the Last Year:   Transportation Needs:   . Film/video editor (Medical):   Marland Kitchen Lack of Transportation (Non-Medical):   Physical Activity:   . Days of Exercise per Week:   . Minutes of Exercise per Session:   Stress:   . Feeling of Stress :   Social Connections:   . Frequency of Communication with Friends and Family:   .  Frequency of Social Gatherings with Friends and Family:   .  Attends Religious Services:   . Active Member of Clubs or Organizations:   . Attends Archivist Meetings:   Marland Kitchen Marital Status:   Intimate Partner Violence:   . Fear of Current or Ex-Partner:   . Emotionally Abused:   Marland Kitchen Physically Abused:   . Sexually Abused:      Allergies  Allergen Reactions  . Imitrex [Sumatriptan] Other (See Comments)    mania     Outpatient Medications Prior to Visit  Medication Sig Dispense Refill  . albuterol (PROVENTIL) (2.5 MG/3ML) 0.083% nebulizer solution Take 3 mLs (2.5 mg total) by nebulization every 6 (six) hours as needed for wheezing or shortness of breath. 75 mL 1  . blood glucose meter kit and supplies KIT Dispense based on patient and insurance preference. Use up to four times daily as directed. ICD-10 E11.65  Z79.4 1 each 0  . blood glucose meter kit and supplies Dispense based on patient and insurance preference. Use up to four times daily as directed. (FOR ICD-10 E10.9, E11.9). 1 each 0  . Blood Glucose Monitoring Suppl (TRUE METRIX METER) w/Device KIT Check blood sugars fasting and at bedtime 1 kit 0  . diclofenac (VOLTAREN) 50 MG EC tablet Take 1 tablet (50 mg total) by mouth 2 (two) times daily as needed for moderate pain. Eat before taking medication 60 tablet 1  . divalproex (DEPAKOTE ER) 500 MG 24 hr tablet Take 3 tablets (1,500 mg total) by mouth at bedtime. 90 tablet 11  . Fluticasone-Salmeterol (ADVAIR) 250-50 MCG/DOSE AEPB Inhale 1 puff into the lungs 2 (two) times daily. 1 each 6  . gabapentin (NEURONTIN) 300 MG capsule Take one pill twice per day and two at bedtime (Patient taking differently: Take 300-600 mg by mouth See admin instructions. Take one pill twice per day and two at bedtime) 120 capsule 5  . glimepiride (AMARYL) 2 MG tablet 1/2 pill (1 mg) once per day before the first meal of the day to control blood sugar 30 tablet 3  . Insulin Pen Needle (TRUEPLUS PEN NEEDLES) 32G X 4 MM MISC Use as directed to inject insulin 100  each 11  . lidocaine (XYLOCAINE) 2 % solution     . Respiratory Therapy Supplies (FLUTTER) DEVI Use with 4 times daily 1 each 0  . Spacer/Aero-Holding Chambers (AEROCHAMBER MV) inhaler Use as instructed 1 each 0  . tizanidine (ZANAFLEX) 2 MG capsule One or two at bedtime to help with headache/muscle spasm (Patient taking differently: Take 2-4 mg by mouth at bedtime as needed for muscle spasms. ) 60 capsule 1  . traMADol (ULTRAM) 50 MG tablet Take 1 tablet (50 mg total) by mouth every 6 (six) hours as needed for moderate pain or severe pain. 100 tablet 0  . TRUEplus Lancets 28G MISC Use to measure blood sugar twice a day 818 each 1  . TRULICITY 2.99 BZ/1.6RC SOPN ADMINISTER 0.5 ML UNDER THE SKIN 1 TIME A WEEK 2 mL 2  . glucose blood (TRUE METRIX BLOOD GLUCOSE TEST) test strip Use as instructed 100 each 12  . losartan-hydrochlorothiazide (HYZAAR) 100-25 MG tablet Take 1 tablet by mouth daily. 90 tablet 3  . magic mouthwash w/lidocaine SOLN Take 5 mLs by mouth 4 (four) times daily as needed for mouth pain. 240 mL 0  . rizatriptan (MAXALT) 10 MG tablet Take 1 tablet (10 mg total) by mouth as needed for migraine. May repeat in 2 hours  if needed 10 tablet 0  . VENTOLIN HFA 108 (90 Base) MCG/ACT inhaler Inhale 2 puffs into the lungs every 4 (four) hours as needed for wheezing or shortness of breath. 18 g 3  . paliperidone (INVEGA SUSTENNA) 156 MG/ML SUSY injection Inject 1 mL (156 mg total) into the muscle once for 1 dose. Due 5/1 1 mL 11  . Insulin Glargine (BASAGLAR KWIKPEN) 100 UNIT/ML SOPN Inject 0.35 mLs (35 Units total) into the skin daily. (Patient not taking: Reported on 11/20/2019) 15 mL 2   No facility-administered medications prior to visit.    Review of Systems  Constitutional: Negative for appetite change, fever and malaise/fatigue.  HENT: Negative for hoarse voice, postnasal drip, rhinorrhea, sneezing, sore throat and trouble swallowing.   Respiratory: Positive for cough and shortness of  breath. Negative for hemoptysis, sputum production and wheezing.   Cardiovascular: Positive for dyspnea on exertion. Negative for chest pain and PND.  Gastrointestinal: Negative for heartburn.  Musculoskeletal: Negative for myalgias.  Neurological: Negative for headaches.   Constitutional:   No  weight loss, night sweats,  Fevers, chills, fatigue, lassitude. HEENT:    headaches,  Difficulty swallowing,  Tooth/dental problems,  Sore throat,                No sneezing, itching, ear ache, nasal congestion, post nasal drip,   CV:  No chest pain,  Orthopnea, PND, swelling in lower extremities, anasarca, dizziness, palpitations  GI  No heartburn, indigestion, abdominal pain, nausea, vomiting, diarrhea, change in bowel habits, loss of appetite  Resp:  shortness of breath with exertion or at rest.  No excess mucus,  productive cough,  No non-productive cough,  No coughing up of blood.  No change in color of mucus.  No wheezing.  No chest wall deformity  Skin: no rash or lesions.  GU: no dysuria, change in color of urine, no urgency or frequency.  No flank pain.  MS:   joint pain or swelling.  No decreased range of motion.   back pain.  Psych:  No change in mood or affect. No depression or anxiety.  No memory loss.      Objective:   Physical Exam   There were no vitals filed for this visit. There were no vitals filed for this visit. This a video visit the patient seen on the video and is in no distress  Opioid Risk  08/23/2019  Alcohol 0  Illegal Drugs 0  Rx Drugs 0  Alcohol 0  Illegal Drugs 0  Rx Drugs 5  Age between 16-45 years  0  History of Preadolescent Sexual Abuse 0  Psychological Disease 2  Opioid Risk Tool Scoring 7  Opioid Risk Interpretation Moderate Risk       Assessment & Plan:  I personally reviewed all images and lab data in the [facility] system as well as any outside material available during this office visit and agree with the  radiology impressions.   COPD with  chronic bronchitis ([place]) Recent COPD exacerbation stable this time  Asked patient to use Advair on a regular basis as he is only taking it as needed  Type 2 diabetes mellitus with hyperglycemia ([place]) Type 2 diabetes with recent glycemic control adequate we will continue Trulicity and Amaryl for now and see the patient back for a face-to-face visit soon  Chronic low back pain with left-sided sciatica Chronic pain syndrome now on tramadol he is not been overusing this medication and the National drug database is checked  and is showing no frequent prescribers  He is not yet ready for tramadol refill will need to wait for another week and then will refill this  Migraine headache without aura I will go ahead and refill the Maxalt at this time   Diagnoses and all orders for this visit:  Bronchospasm -     VENTOLIN HFA 108 (90 Base) MCG/ACT inhaler; Inhale 2 puffs into the lungs every 4 (four) hours as needed for wheezing or shortness of breath.  COPD with acute exacerbation (HCC) -     VENTOLIN HFA 108 (90 Base) MCG/ACT inhaler; Inhale 2 puffs into the lungs every 4 (four) hours as needed for wheezing or shortness of breath.  COPD with chronic bronchitis (HCC)  Type 2 diabetes mellitus with hyperglycemia, with long-term current use of insulin (HCC)  Chronic low back pain with left-sided sciatica, unspecified back pain laterality  Migraine without aura and without status migrainosus, not intractable  Other orders -     glucose blood (TRUE METRIX BLOOD GLUCOSE TEST) test strip; Use as instructed -     losartan-hydrochlorothiazide (HYZAAR) 100-25 MG tablet; Take 1 tablet by mouth daily. -     rizatriptan (MAXALT) 10 MG tablet; Take 1 tablet (10 mg total) by mouth as needed for migraine. May repeat in 2 hours if needed -     magic mouthwash w/lidocaine SOLN; Take 5 mLs by mouth 4 (four) times daily as needed for mouth pain.     Follow Up Instructions:   Patient knows an in office  exam will be scheduled in the next 6 weeks I discussed the assessment and treatment plan with the patient. The patient was provided an opportunity to ask questions and all were answered. The patient agreed with the plan and demonstrated an understanding of the instructions.   The patient was advised to call back or seek an in-person evaluation if the symptoms worsen or if the condition fails to improve as anticipated.  I provided 30 minutes of non-face-to-face time during this encounter  including  median intraservice time , review of notes, labs, imaging, medications  and explaining diagnosis and management to the patient .    Asencion Noble, MD

## 2019-11-21 ENCOUNTER — Ambulatory Visit: Payer: Medicare Other | Attending: Critical Care Medicine

## 2019-11-21 DIAGNOSIS — Z23 Encounter for immunization: Secondary | ICD-10-CM

## 2019-11-21 NOTE — Progress Notes (Signed)
   Covid-19 Vaccination Clinic  Name:  Joshua [name]    MRN: [id number] DOB: Nov 20, 1955  11/21/2019  [name] was observed post Covid-19 immunization for 15 minutes without incident. He was provided with Vaccine Information Sheet and instruction to access the V-Safe system.   Mr. Cast was instructed to call 911 with any severe reactions post vaccine: Marland Kitchen Difficulty breathing  . Swelling of face and throat  . A fast heartbeat  . A bad rash all over body  . Dizziness and weakness   Immunizations Administered    Name Date Dose VIS Date Route   Moderna COVID-19 Vaccine 11/21/2019 12:36 PM 0.5 mL 03/2019 Intramuscular   Manufacturer: Moderna   Lot: [id number]   [place]: [id number]

## 2019-11-30 ENCOUNTER — Other Ambulatory Visit: Payer: Self-pay | Admitting: Critical Care Medicine

## 2019-11-30 ENCOUNTER — Other Ambulatory Visit: Payer: Self-pay | Admitting: Family Medicine

## 2019-11-30 DIAGNOSIS — G894 Chronic pain syndrome: Secondary | ICD-10-CM

## 2019-11-30 DIAGNOSIS — M5442 Lumbago with sciatica, left side: Secondary | ICD-10-CM

## 2019-11-30 DIAGNOSIS — G8929 Other chronic pain: Secondary | ICD-10-CM

## 2019-11-30 MED ORDER — TRAMADOL HCL 50 MG PO TABS: 50.0000 mg | ORAL_TABLET | Freq: Four times a day (QID) | ORAL | 0 refills | Status: DC | PRN

## 2019-11-30 NOTE — Telephone Encounter (Signed)
traMADol (ULTRAM) 50 MG tablet     Patient is requesting refill.    Pharmacy:  [facility] [place], [address] [place] Phone:  [phone]  Fax:  [phone]

## 2019-12-19 ENCOUNTER — Ambulatory Visit: Payer: Medicare Other | Admitting: Family Medicine

## 2019-12-19 ENCOUNTER — Ambulatory Visit: Payer: Medicare Other | Attending: Critical Care Medicine

## 2019-12-19 DIAGNOSIS — Z23 Encounter for immunization: Secondary | ICD-10-CM

## 2019-12-19 NOTE — Progress Notes (Signed)
   Covid-19 Vaccination Clinic  Name:  Joshua [name]    MRN: [id number] DOB: 1956-02-15  12/19/2019  [name] was observed post Covid-19 immunization for  15 mins without incident. He was provided with Vaccine Information Sheet and instruction to access the V-Safe system.   [name] was instructed to call 911 with any severe reactions post vaccine: Marland Kitchen Difficulty breathing  . Swelling of face and throat  . A fast heartbeat  . A bad rash all over body  . Dizziness and weakness   Immunizations Administered    Name Date Dose VIS Date Route   Moderna COVID-19 Vaccine 12/19/2019  3:01 PM 0.5 mL 03/2019 Intramuscular   Manufacturer: Moderna   Lot: [id number]   [place]: [phone]

## 2019-12-22 ENCOUNTER — Other Ambulatory Visit: Payer: Self-pay | Admitting: Family Medicine

## 2019-12-22 NOTE — Telephone Encounter (Signed)
Requested medication (s) are due for refill today: yes  Requested medication (s) are on the active medication list: yes  Last refill:  09/12/19  Future visit scheduled: no  Notes to clinic:  labs overdue   Requested Prescriptions  Pending Prescriptions Disp Refills   TRULICITY 4.09 WJ/1.9JY SOPN [Pharmacy Med Name: TRULICITY 0.75MG /0.5ML SDP 0.5ML] 2 mL 2    Sig: ADMINISTER 0.5 ML UNDER THE SKIN 1 TIME A WEEK      Endocrinology:  Diabetes - GLP-1 Receptor Agonists Passed - 12/22/2019  9:30 AM      Passed - HBA1C is between 0 and 7.9 and within 180 days    HbA1c, POC (controlled diabetic range)  Date Value Ref Range Status  09/18/2019 7.3 (A) 0.0 - 7.0 % Final          Passed - Valid encounter within last 6 months    Recent Outpatient Visits           1 month ago COPD with chronic bronchitis ([place])   [place] Elsie Stain, MD   3 months ago Type 2 diabetes mellitus without complication, with long-term current use of insulin ([place])   [place] Elsie Stain, MD   3 months ago Type 2 diabetes mellitus without complication, with long-term current use of insulin ([place])   [place] Fulp, [place], MD   4 months ago Chronic pain syndrome   [place], Patrick E, MD   5 months ago Type 2 diabetes mellitus with hyperglycemia, with long-term current use of insulin [facility])   [place] [facility] Antony Blackbird, MD

## 2019-12-26 ENCOUNTER — Telehealth: Payer: Medicare Other | Admitting: Family Medicine

## 2019-12-27 ENCOUNTER — Encounter: Payer: Self-pay | Admitting: Family Medicine

## 2019-12-27 ENCOUNTER — Ambulatory Visit: Payer: Medicare Other | Attending: Family Medicine | Admitting: Family Medicine

## 2019-12-27 DIAGNOSIS — F17200 Nicotine dependence, unspecified, uncomplicated: Secondary | ICD-10-CM | POA: Diagnosis not present

## 2019-12-27 DIAGNOSIS — R05 Cough: Secondary | ICD-10-CM

## 2019-12-27 DIAGNOSIS — R058 Other specified cough: Secondary | ICD-10-CM

## 2019-12-27 DIAGNOSIS — J4489 Other specified chronic obstructive pulmonary disease: Secondary | ICD-10-CM

## 2019-12-27 DIAGNOSIS — M545 Low back pain, unspecified: Secondary | ICD-10-CM

## 2019-12-27 DIAGNOSIS — G894 Chronic pain syndrome: Secondary | ICD-10-CM

## 2019-12-27 DIAGNOSIS — J449 Chronic obstructive pulmonary disease, unspecified: Secondary | ICD-10-CM

## 2019-12-27 DIAGNOSIS — G43709 Chronic migraine without aura, not intractable, without status migrainosus: Secondary | ICD-10-CM

## 2019-12-27 MED ORDER — PROMETHAZINE-DM 6.25-15 MG/5ML PO SYRP: 5.0000 mL | ORAL_SOLUTION | Freq: Four times a day (QID) | ORAL | 0 refills | Status: DC | PRN

## 2019-12-27 MED ORDER — TRAMADOL HCL 50 MG PO TABS: 50.0000 mg | ORAL_TABLET | Freq: Four times a day (QID) | ORAL | 3 refills | Status: DC | PRN

## 2019-12-27 MED ORDER — RIZATRIPTAN BENZOATE 10 MG PO TABS: 10.0000 mg | ORAL_TABLET | ORAL | 3 refills | Status: DC | PRN

## 2019-12-27 NOTE — Progress Notes (Signed)
Virtual Visit via telephone Note  I connected with Joshua [name] on 12/27/19 at  1:30 PM EDT by a telemedicine application and verified that I am speaking with the correct person using two identifiers.  Location: Patient: Home Provider: [facility] office   I discussed the limitations of evaluation and management by telemedicine and the availability of in person appointments. The patient expressed understanding and agreed to proceed.  History of Present Illness:        64 year old diabetic male who presents secondary to complaint of bilateral low back pain with radiation down both legs.  He reports increased difficulty with ambulation secondary to leg pain.  He does continue to take tramadolWith last prescription given on 11/30/2019 for 25-day supply per review of chart and [facility] drug registry.  He reports that his wife dispenses the tramadol therefore he is not sure how many pills he has left but feels that he will likely need a refill.  He reports that he has had longstanding issues with his back and has had MRI x2 in the past but cannot recall how long ago his last MRI was done.  He reports no recent interventions to help with his chronic back pain.         Patient request cough medication to help with recurrent nonproductive cough.  He does have COPD and reports that he has been adherent with medication prescribed for COPD.  He denies any recent increase in shortness of breath and cough has remained nonproductive.  He does admit to continued smoking.  He denies any fever or chills, no chest pain or palpitations.  He has had some recurrent headaches which he believes are migrainous in nature.  He does need refill of Maxalt for treatment of chronic migraines.    Observations/Objective: Patient was able to talk without stopping due to shortness of breath or cough.  No coughing noted during telemedicine visit but patient did have some recurrent clearing of his throat.  Assessment and Plan: 1.  Low back pain with radiation Patient with chronic issues with difficulty with ambulation secondary to below back pain with radiation for which she agrees to be referred for further evaluation and treatment.  Referral placed to orthopedic surgery.  No recent imaging was available on review of patient's chart.  Refill provided of patient's tramadol for chronic pain. - Ambulatory referral to Orthopedic Surgery - traMADol (ULTRAM) 50 MG tablet; Take 1 tablet (50 mg total) by mouth every 6 (six) hours as needed for moderate pain or severe pain.  Dispense: 120 tablet; Refill: 3  2. COPD with chronic bronchitis ([place]) He reports compliance with current medications for treatment of COPD but continues to smoke.  Complete smoking cessation advised as well as continuation of his current medications for COPD.  Prescription for promethazine DM provided to help with patient's complaint of chronic cough and request for cough medication.  He is to follow-up with his pulmonologist, Dr. Joya Gaskins. - promethazine-dextromethorphan (PROMETHAZINE-DM) 6.25-15 MG/5ML syrup; Take 5 mLs by mouth 4 (four) times daily as needed for cough.  Dispense: 118 mL; Refill: 0  3. Chronic pain syndrome Patient with history of lumbar degenerative disc disease with sciatica causing issues of chronic pain.  He did agree to referral to orthopedics for further evaluation and treatment and refill provided of tramadol 50 mg to take 1 every 6 hours as needed #120 with 3 refills sent to patient's pharmacy. - traMADol (ULTRAM) 50 MG tablet; Take 1 tablet (50 mg total) by mouth  every 6 (six) hours as needed for moderate pain or severe pain.  Dispense: 120 tablet; Refill: 3  4. Chronic migraine without aura without status migrainosus, not intractable Patient request refill of his migraine medication and prescription provided for refill of Maxalt which he reports is effective for treatment of migraines and he denies any side effects associated with use of  Maxalt. - rizatriptan (MAXALT) 10 MG tablet; Take 1 tablet (10 mg total) by mouth as needed for migraine. May repeat in 2 hours if needed  Dispense: 10 tablet; Refill: 3  5. Recurrent cough; 6.  Tobacco dependence Patient reports recurrent cough and request prescription cough medication.  Upon questioning, patient also continues to smoke.  Again discussed and advised complete smoking cessation.  Prescription provided for promethazine DM and patient is encouraged to continue follow-up with pulmonologist, Dr. Joya Gaskins regarding his COPD and recurrent cough. - promethazine-dextromethorphan (PROMETHAZINE-DM) 6.25-15 MG/5ML syrup; Take 5 mLs by mouth 4 (four) times daily as needed for cough.  Dispense: 118 mL; Refill: 0    Follow Up Instructions:Return for chronic issues; keep upcoming appt but schedule sooner if needed.    I discussed the assessment and treatment plan with the patient. The patient was provided an opportunity to ask questions and all were answered. The patient agreed with the plan and demonstrated an understanding of the instructions.   The patient was advised to call back or seek an in-person evaluation if the symptoms worsen or if the condition fails to improve as anticipated.  I provided 12 minutes of non-face-to-face time during this encounter.  An additional 10 minutes spent on review of chart, formulation of treatment plan and completion of today's visit note   Antony Blackbird, MD

## 2020-01-01 ENCOUNTER — Ambulatory Visit: Payer: Medicare Other | Admitting: Critical Care Medicine

## 2020-01-08 ENCOUNTER — Other Ambulatory Visit: Payer: Self-pay | Admitting: Family Medicine

## 2020-01-08 DIAGNOSIS — G8929 Other chronic pain: Secondary | ICD-10-CM

## 2020-01-08 DIAGNOSIS — M5442 Lumbago with sciatica, left side: Secondary | ICD-10-CM

## 2020-01-16 ENCOUNTER — Telehealth: Payer: Self-pay | Admitting: Orthopaedic Surgery

## 2020-01-16 NOTE — Telephone Encounter (Signed)
Faxed auth to [facility], asking to please send 2017 MRI report & corresponding records for patients appt 9/24 with Dr. Lorin Mercy.

## 2020-01-18 ENCOUNTER — Ambulatory Visit: Payer: Self-pay

## 2020-01-18 ENCOUNTER — Ambulatory Visit (INDEPENDENT_AMBULATORY_CARE_PROVIDER_SITE_OTHER): Payer: Medicare Other | Admitting: Orthopaedic Surgery

## 2020-01-18 VITALS — BP 155/89 | HR 95 | Ht 69.0 in | Wt 260.0 lb

## 2020-01-18 DIAGNOSIS — M545 Low back pain, unspecified: Secondary | ICD-10-CM

## 2020-01-18 DIAGNOSIS — G8929 Other chronic pain: Secondary | ICD-10-CM

## 2020-01-18 NOTE — Telephone Encounter (Signed)
Received fax from [facility]. Stated they were uable to fulfill our request for records. Stated they did not MRI from 2017. Sent no additional records.
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
## 2020-01-28 ENCOUNTER — Other Ambulatory Visit: Payer: Self-pay

## 2020-01-28 ENCOUNTER — Ambulatory Visit: Payer: Self-pay | Admitting: *Deleted

## 2020-01-28 ENCOUNTER — Ambulatory Visit: Payer: Medicare Other | Attending: Family Medicine | Admitting: Family Medicine

## 2020-01-28 VITALS — BP 136/80 | HR 100 | Ht 69.0 in | Wt 268.0 lb

## 2020-01-28 DIAGNOSIS — Z760 Encounter for issue of repeat prescription: Secondary | ICD-10-CM | POA: Insufficient documentation

## 2020-01-28 DIAGNOSIS — I10 Essential (primary) hypertension: Secondary | ICD-10-CM | POA: Diagnosis not present

## 2020-01-28 DIAGNOSIS — M5416 Radiculopathy, lumbar region: Secondary | ICD-10-CM | POA: Diagnosis present

## 2020-01-28 DIAGNOSIS — G894 Chronic pain syndrome: Secondary | ICD-10-CM | POA: Diagnosis not present

## 2020-01-28 DIAGNOSIS — E1151 Type 2 diabetes mellitus with diabetic peripheral angiopathy without gangrene: Secondary | ICD-10-CM | POA: Diagnosis not present

## 2020-01-28 DIAGNOSIS — M545 Low back pain, unspecified: Secondary | ICD-10-CM

## 2020-01-28 DIAGNOSIS — Z794 Long term (current) use of insulin: Secondary | ICD-10-CM | POA: Insufficient documentation

## 2020-01-28 DIAGNOSIS — Z7951 Long term (current) use of inhaled steroids: Secondary | ICD-10-CM | POA: Insufficient documentation

## 2020-01-28 DIAGNOSIS — F250 Schizoaffective disorder, bipolar type: Secondary | ICD-10-CM | POA: Insufficient documentation

## 2020-01-28 DIAGNOSIS — F17210 Nicotine dependence, cigarettes, uncomplicated: Secondary | ICD-10-CM | POA: Insufficient documentation

## 2020-01-28 DIAGNOSIS — J449 Chronic obstructive pulmonary disease, unspecified: Secondary | ICD-10-CM | POA: Diagnosis not present

## 2020-01-28 DIAGNOSIS — Z79899 Other long term (current) drug therapy: Secondary | ICD-10-CM | POA: Insufficient documentation

## 2020-01-28 MED ORDER — TRAMADOL HCL 50 MG PO TABS: 50.0000 mg | ORAL_TABLET | Freq: Four times a day (QID) | ORAL | 3 refills | Status: DC | PRN

## 2020-01-28 NOTE — Telephone Encounter (Signed)
Pt scheduled for 3:50p w/ Dr. Chapman Fitch today

## 2020-01-28 NOTE — Telephone Encounter (Signed)
Routed to incorrect practice; will re-route to [place]; the pt is seen by Dr Ander Gaster fulp.

## 2020-01-28 NOTE — Telephone Encounter (Signed)
Sorry, not a patient here at [facility]

## 2020-01-28 NOTE — Telephone Encounter (Signed)
Patient called with complaints of left hand numbness and tingling and not feeling well over the weekend; the pt says he had neck pain that caused his head to hurt; shutting his eyes made him better; he was having problems concentrating; these symptoms started on 01/26/20; the pt states he has been having right hand numbness since 01/26/20; the pt says he was also having chest pain on 01/26/20 rated his pain as mild and he felt his heart fluttering; he denies discomfort now; his BP was in the 150s; recommendations made per nurse triage protocol; he verbalized understanding but would like to speak with Dr Chapman Fitch at [facility] instead; he can be contacted at [phone]; will route to office for final disposition.  Reason for Disposition  Headache  (and neurologic deficit)  Answer Assessment - Initial Assessment Questions 1. SYMPTOM: "What is the main symptom you are concerned about?" (e.g., weakness, numbness)     Bilateral hand numbness/tingling 2. ONSET: "When did this start?" (minutes, hours, days; while sleeping)    01/26/20 3. LAST NORMAL: "When was the last time you were normal (no symptoms)?"     01/26/20 4. PATTERN "Does this come and go, or has it been constant since it started?"  "Is it present now?"     Constant; still present 5. CARDIAC SYMPTOMS: "Have you had any of the following symptoms: chest pain, difficulty breathing, palpitations?"     Chest pain on 01/26/20 6. NEUROLOGIC SYMPTOMS: "Have you had any of the following symptoms: headache, dizziness, vision loss, double vision, changes in speech, unsteady on your feet?"     Headache, neck pain 7. OTHER SYMPTOMS: "Do you have any other symptoms?"     Heat fluttering; problems concentrating 8. PREGNANCY: "Is there any chance you are pregnant?" "When was your last menstrual period?"    n/a  Protocols used: NEUROLOGIC DEFICIT-A-AH

## 2020-01-28 NOTE — Telephone Encounter (Signed)
Pls read notes below, pt was given advice by triage RN but requested to speak w/ PCP. Pt has appt on 10/13 w/ Dr. Joya Gaskins

## 2020-01-28 NOTE — Progress Notes (Signed)
Established Patient Office Visit  Subjective:  Patient ID: Joshua [name], male    DOB: 1956/02/05  Age: 64 y.o. MRN: [id number]  CC:  Chief Complaint  Patient presents with  . Numbness    HPI Joshua [name], 64 year old male with multiple chronic medical issues including diabetes, COPD, schizoaffective disorder-bipolar type, hypertension and peripheral vascular disease who is seen for medication refill/follow-up of chronic low back pain with radiation.  He reports that he has had an improvement in his back pain since being seen by orthopedics.  He feels that he can walk better with less pain.  He however still needs refill of tramadol as he states that this medication allows him to have increased mobility without being in constant pain.  He reports no falls since his last visit.  He continues to use a walking stick/cane to help with ambulation. (Patient started talking about being dared to eat some seeds that made him sick. Patient had what appeared to be a pod from [place] tree with red seeds in the pocket of his overalls which he took out at the end of the visit and stated that this was the type of see that he had eaten and made him sick.  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  Patient was encouraged to throw away the seed pod and not eat these seeds in the future).    Past Medical History:  Diagnosis Date  . Back pain   . Bipolar disorder ([place])   . Cannabis use disorder, moderate, dependence ([place])   . Chronic pain   . Diabetes mellitus without complication ([place])   . Generalized anxiety disorder   . Headache   . Hypertension   . Schizoaffective disorder [facility])     Past Surgical History:  Procedure Laterality Date  . APPENDECTOMY  1970    Family History  Problem Relation Age of Onset  . Diabetes  Mother   . Cancer Mother        unsure what type  . Heart disease Mother   . Hypertension Mother   . Dementia Father   . Diabetes Brother   . Dementia Maternal Grandfather   . Cancer Paternal Grandmother 61       breast  . Mental illness Neg Hx     Social History   Socioeconomic History  . Marital status: Married    Spouse name: Not on file  . Number of children: Not on file  . Years of education: Not on file  . Highest education level: Not on file  Occupational History  . Not on file  Tobacco Use  . Smoking status: Current Every Day Smoker    Packs/day: 2.00    Types: Cigarettes  . Smokeless tobacco: Never Used  Vaping Use  . Vaping Use: Never used  Substance and Sexual Activity  . Alcohol use: Not Currently  . Drug use: Yes    Types: Marijuana    Comment: "have changed to hemp now" 09/17/17  . Sexual activity: Not on file  Other Topics Concern  . Not on file  Social History Narrative  . Not on file   Social Determinants of Health   Financial Resource Strain:   . Difficulty of Paying Living Expenses: Not on file  Food Insecurity:   .  Worried About Programme researcher, broadcasting/film/video in the Last Year: Not on file  . Ran Out of Food in the Last Year: Not on file  Transportation Needs:   . Lack of Transportation (Medical): Not on file  . Lack of Transportation (Non-Medical): Not on file  Physical Activity:   . Days of Exercise per Week: Not on file  . Minutes of Exercise per Session: Not on file  Stress:   . Feeling of Stress : Not on file  Social Connections:   . Frequency of Communication with Friends and Family: Not on file  . Frequency of Social Gatherings with Friends and Family: Not on file  . Attends Religious Services: Not on file  . Active Member of Clubs or Organizations: Not on file  . Attends Banker Meetings: Not on file  . Marital Status: Not on file  Intimate Partner Violence:   . Fear of Current or Ex-Partner: Not on file  . Emotionally  Abused: Not on file  . Physically Abused: Not on file  . Sexually Abused: Not on file    Outpatient Medications Prior to Visit  Medication Sig Dispense Refill  . albuterol (PROVENTIL) (2.5 MG/3ML) 0.083% nebulizer solution Take 3 mLs (2.5 mg total) by nebulization every 6 (six) hours as needed for wheezing or shortness of breath. 75 mL 1  . blood glucose meter kit and supplies KIT Dispense based on patient and insurance preference. Use up to four times daily as directed. ICD-10 E11.65  Z79.4 1 each 0  . blood glucose meter kit and supplies Dispense based on patient and insurance preference. Use up to four times daily as directed. (FOR ICD-10 E10.9, E11.9). 1 each 0  . Blood Glucose Monitoring Suppl (TRUE METRIX METER) w/Device KIT Check blood sugars fasting and at bedtime 1 kit 0  . diclofenac (VOLTAREN) 50 MG EC tablet TAKE 1 TABLET( 50 MG TOTAL) BY MOUTH 2 TIMES DAILY AS NEEDED FOR MODERATE PAIN, EAT BEFORE TAKING MEDICATION 60 tablet 1  . divalproex (DEPAKOTE ER) 500 MG 24 hr tablet Take 3 tablets (1,500 mg total) by mouth at bedtime. 90 tablet 11  . Fluticasone-Salmeterol (ADVAIR) 250-50 MCG/DOSE AEPB Inhale 1 puff into the lungs 2 (two) times daily. 1 each 6  . gabapentin (NEURONTIN) 300 MG capsule Take one pill twice per day and two at bedtime (Patient taking differently: Take 300-600 mg by mouth See admin instructions. Take one pill twice per day and two at bedtime) 120 capsule 5  . glimepiride (AMARYL) 2 MG tablet 1/2 pill (1 mg) once per day before the first meal of the day to control blood sugar 30 tablet 3  . glucose blood (TRUE METRIX BLOOD GLUCOSE TEST) test strip Use as instructed 100 each 12  . Insulin Pen Needle (TRUEPLUS PEN NEEDLES) 32G X 4 MM MISC Use as directed to inject insulin 100 each 11  . losartan-hydrochlorothiazide (HYZAAR) 100-25 MG tablet Take 1 tablet by mouth daily. 90 tablet 3  . magic mouthwash w/lidocaine SOLN Take 5 mLs by mouth 4 (four) times daily as needed  for mouth pain. 240 mL 0  . Respiratory Therapy Supplies (FLUTTER) DEVI Use with 4 times daily 1 each 0  . rizatriptan (MAXALT) 10 MG tablet Take 1 tablet (10 mg total) by mouth as needed for migraine. May repeat in 2 hours if needed 10 tablet 3  . Spacer/Aero-Holding Chambers (AEROCHAMBER MV) inhaler Use as instructed 1 each 0  . traMADol (ULTRAM) 50 MG tablet Take  1 tablet (50 mg total) by mouth every 6 (six) hours as needed for moderate pain or severe pain. 120 tablet 3  . TRUEplus Lancets 28G MISC Use to measure blood sugar twice a day 100 each 1  . TRULICITY 0.75 MG/0.5ML SOPN ADMINISTER 0.5 ML UNDER THE SKIN 1 TIME A WEEK 2 mL 2  . VENTOLIN HFA 108 (90 Base) MCG/ACT inhaler Inhale 2 puffs into the lungs every 4 (four) hours as needed for wheezing or shortness of breath. 18 g 3  . lidocaine (XYLOCAINE) 2 % solution     . paliperidone (INVEGA SUSTENNA) 156 MG/ML SUSY injection Inject 1 mL (156 mg total) into the muscle once for 1 dose. Due 5/1 1 mL 11  . promethazine-dextromethorphan (PROMETHAZINE-DM) 6.25-15 MG/5ML syrup Take 5 mLs by mouth 4 (four) times daily as needed for cough. (Patient not taking: Reported on 01/28/2020) 118 mL 0  . tizanidine (ZANAFLEX) 2 MG capsule One or two at bedtime to help with headache/muscle spasm (Patient not taking: Reported on 01/28/2020) 60 capsule 1   No facility-administered medications prior to visit.    Allergies  Allergen Reactions  . Imitrex [Sumatriptan] Other (See Comments)    mania    ROS Review of Systems  Constitutional: Negative for chills and fever.  HENT: Negative for sore throat and trouble swallowing.   Respiratory: Negative for cough and shortness of breath.   Cardiovascular: Negative for chest pain and palpitations.  Gastrointestinal: Negative for abdominal pain, constipation, diarrhea and nausea.  Endocrine: Negative for polydipsia, polyphagia and polyuria.  Genitourinary: Negative for dysuria and frequency.  Musculoskeletal:  Positive for back pain and gait problem.  Skin: Negative for rash and wound.  Neurological: Positive for numbness and headaches. Negative for dizziness.  Hematological: Negative for adenopathy. Does not bruise/bleed easily.  Psychiatric/Behavioral: Negative for self-injury and suicidal ideas.      Objective:    Physical Exam Vitals and nursing note reviewed.  Constitutional:      Appearance: Normal appearance. He is obese.  Cardiovascular:     Rate and Rhythm: Normal rate and regular rhythm.  Pulmonary:     Effort: Pulmonary effort is normal.     Breath sounds: Normal breath sounds.  Abdominal:     Palpations: Abdomen is soft.     Tenderness: There is no abdominal tenderness. There is no right CVA tenderness, left CVA tenderness, guarding or rebound.  Musculoskeletal:        General: Tenderness (Lumbosacral tenderness palpation and bilateral thoracolumbar paraspinous spasm) present.     Right lower leg: No edema.     Left lower leg: No edema.  Skin:    General: Skin is warm and dry.  Neurological:     Mental Status: He is alert.  Psychiatric:     Comments: Patient seems more tangential/less focused at today's visit and occasionally makes some nonsensical statements.  Patient has to be redirected in an attempt to get questions answered     BP 136/80 (BP Location: Left Arm, Patient Position: Sitting)   Pulse 100   Ht 5\' 9"  (1.753 m)   Wt 268 lb (121.6 kg)   SpO2 96%   BMI 39.58 kg/m  Wt Readings from Last 3 Encounters:  01/28/20 268 lb (121.6 kg)  01/18/20 260 lb (117.9 kg)  09/18/19 254 lb 6.4 oz (115.4 kg)     Health Maintenance Due  Topic Date Due  . Hepatitis C Screening  Never done  . PNEUMOCOCCAL POLYSACCHARIDE VACCINE AGE 45-64 HIGH  RISK  Never done  . FOOT EXAM  Never done  . OPHTHALMOLOGY EXAM  Never done  . COLONOSCOPY  Never done  . INFLUENZA VACCINE  11/25/2019      Lab Results  Component Value Date   TSH 1.140 08/28/2018   Lab Results    Component Value Date   WBC 6.2 08/04/2019   HGB 14.1 08/04/2019   HCT 41.8 08/04/2019   MCV 96.1 08/04/2019   PLT 151 08/04/2019   Lab Results  Component Value Date   NA 131 (L) 08/04/2019   K 4.8 08/04/2019   CO2 22 08/04/2019   GLUCOSE 278 (H) 08/04/2019   BUN 7 (L) 08/04/2019   CREATININE 1.07 08/04/2019   BILITOT 1.8 (H) 08/04/2019   ALKPHOS 76 08/04/2019   AST 25 08/04/2019   ALT 18 08/04/2019   PROT 6.6 08/04/2019   ALBUMIN 3.7 08/04/2019   CALCIUM 8.7 (L) 08/04/2019   ANIONGAP 12 08/04/2019   Lab Results  Component Value Date   CHOL 144 07/20/2019   Lab Results  Component Value Date   HDL 32 (L) 07/20/2019   Lab Results  Component Value Date   LDLCALC 72 07/20/2019   Lab Results  Component Value Date   TRIG 246 (H) 07/20/2019   Lab Results  Component Value Date   CHOLHDL 4.5 07/20/2019   Lab Results  Component Value Date   HGBA1C 7.3 (A) 09/18/2019      Assessment & Plan:  1. Lumbar radiculopathy 2. Low back pain with radiation 3.  Chronic pain syndrome Patient's recent visit with orthopedics was reviewed and discussed with patient.  He is scheduled for upcoming MRI of the lumbar spine on October 17 and he is suspected to have lumbar spinal stenosis as a cause of his chronic low back pain with radicular symptoms.  He is encouraged to continue using his cane/walking stick to help with ambulation and prevent falls.  He is provided with refill of tramadol which she states his wife will pick up from pharmacy and that his wife manages his pain medication.  Patient has also previously been referred to pain management.  State drug registry was reviewed at today's visit prior to providing refill of tramadol. - traMADol (ULTRAM) 50 MG tablet; Take 1 tablet (50 mg total) by mouth every 6 (six) hours as needed for moderate pain or severe pain.  Dispense: 120 tablet; Refill: 3     Follow-up: Return in about 6 weeks (around 03/10/2020) for DM bloodwork.     Antony Blackbird, MD

## 2020-01-30 DIAGNOSIS — F17200 Nicotine dependence, unspecified, uncomplicated: Secondary | ICD-10-CM | POA: Diagnosis not present

## 2020-01-30 DIAGNOSIS — F250 Schizoaffective disorder, bipolar type: Secondary | ICD-10-CM | POA: Diagnosis not present

## 2020-02-03 ENCOUNTER — Encounter: Payer: Self-pay | Admitting: Family Medicine

## 2020-02-05 ENCOUNTER — Telehealth: Payer: Self-pay

## 2020-02-05 DIAGNOSIS — F250 Schizoaffective disorder, bipolar type: Secondary | ICD-10-CM | POA: Diagnosis not present

## 2020-02-05 NOTE — Telephone Encounter (Signed)
ATC pt for appt reminder and to info provider wishes for visit to be virtual instead of in-clinic, no answer, LMOM

## 2020-02-06 ENCOUNTER — Ambulatory Visit: Payer: Medicare Other | Attending: Critical Care Medicine | Admitting: Critical Care Medicine

## 2020-02-06 ENCOUNTER — Other Ambulatory Visit: Payer: Self-pay

## 2020-02-06 ENCOUNTER — Encounter: Payer: Self-pay | Admitting: Critical Care Medicine

## 2020-02-06 DIAGNOSIS — J449 Chronic obstructive pulmonary disease, unspecified: Secondary | ICD-10-CM | POA: Diagnosis not present

## 2020-02-06 NOTE — Progress Notes (Signed)
Subjective:    Patient ID: Joshua [name], male    DOB: 09/10/1955, 64 y.o.   MRN: [id number]  Virtual Visit via telephone note  I connected with@ on 02/06/20 at@ by a telephone enabled telemedicine application and verified that I am speaking with the correct person using two identifiers.   Consent:    I discussed the limitations, risks, security and privacy concerns of performing an evaluation and management service by video visit and the availability of in person appointments. I also discussed with the patient that there may be a patient responsible charge related to this service. The patient expressed understanding and agreed to proceed.  Location of patient: Patient is at home  Location of provider: I am in my office  Persons participating in the televisit with the patient.  No one else on the call   03/27/19 This is a video telehealth visit for a patient who has underlying COPD and was needing further follow-up upon referral from the patient's primary care provider.  The patient's been on several rounds of prednisone antibiotics with only improvement while on prednisone.  He states he is short of breath at rest and with exertion.  He has had problems with breathing for many years got worse over the past year.  He also has underlying diabetes on Trulicity and insulin long-acting.  He also states he has chronic low back pain and has difficulty ambulating this affects his breathing as well.  His weight is gone up as well which is aggravated his chronic pain.  He had a pain management doctor but has not seen that doctor since November 2019.  The patient is on tramadol from an urgent care visit he states this is helped to some degree.  He states nonsteroidals and gabapentin have not been of use in the past.  He also states that he has increased fluid retention and is questioning whether his losartan can be adjusted to accommodate reduction in fluid retention.  He still smokes 2 pack a day  of cigarettes and previous smoked as much as 4 packs a day.  He has not had a pulmonary function test.  Chest x-ray in July of this year showed no active disease.  The patient does have a Symbicort inhaler and is using this however only on an as needed basis along with albuterol which is as needed  Note when I tried to do a medication reconciliation the patient was not clear on which medicines he was taking and which ones he had stopped and he has a list of nearly 40 medicines in his E HR.  04/10/2019 This is a follow-up visit from a video visit that occurred recently.  The patient was given doxycycline and pulse prednisone for COPD exacerbation and is maintaining Symbicort daily.  The patient was referred for the COPD evaluation.  I also refill the patient's tramadol at the last visit.  He has pending pulmonary function test which have yet to be obtained.  He does state that his back is improved on the tramadol.  He also states that he has broken teeth and they will bite into his gum and mouth causing pain in the mouth.  The tramadol helps to some degree but is requesting a mouthwash.  He has not seen a dentist recently.  Patient is still smoking 2 packs a day of cigarettes.  The patient did bring all his current active medications in with him today and I did a complete medication reconciliation see is current  new medication list  04/17/2019 Video visit request re dyspnea Copd  This patient was last seen in person on December 15 and since that time the patient has had recurrent exacerbation of COPD.  He has been using his Symbicort daily.  He states he awoke with acute shortness of breath and lung congestion.  This occurred 2 days ago.  He also had increased pain in the right side.  He has been coughing up profuse thick yellow mucus.  He is using the flutter valve and the nebulizer which helps to remove some of the mucus.  His mouth pain is improved with the [facility]ukes mouthwash and would like a refill.   He is also out of his tramadol and is yet to achieve a chronic pain management appointment.  There is no fever.  Note he just had a negative Covid test December 18 in preparation for pulmonary function studies which have yet to be scheduled  1/18: This is a follow-up from previous visit in December that was a video visit.  The patient did receive a round of Levaquin and prednisone and he improved to some degree.  He only has albuterol for his nebulizer and is about to run out.  The patient worsened since he ran out of prednisone and the antibiotic.  He still has a lot of phlegm that is difficult to raise.  He is not using his flutter valve every day.  He continues to smoke 1 pack a day of cigarettes but previously was at 2 packs a day The patient notes pressure in the frontal and sinus area and he denies any ear pain  Note his GAD-7 score is 6 and PHQ-9 score is 9 today  He states he cannot afford the co-pay at this time of the year with his insurance.  He has run out of insulin for the past 10 days and would prefer to have his medications filled at our pharmacy  The patient also had a referral to [facility] pain center but they declined his case.  Patient's been taking 50 mg 3 times daily of the tramadol  06/28/2019 Telephone visit: Since the last visit the patient states he still having shortness of breath but his cough and mucus production as well as breathing are better since taking the course of antibiotics and prednisone.  He is now down to 1 pack a day from 3 packs a day of cigarettes.  He states he still has no energy.  He states his lower back pain is under reasonable control with tramadol.  The patient is on Trulicity and insulin glargine for his diabetes but has run out of testing strips.  He also wishes a refill on his Maxalt for his migraines.  The patient maintains his inhaled medications without changes.  09/18/2019 Patient seen in return follow-up and his shortness of breath is much  improved.  He is coughing less.  He is smoking less.  He has no new complaints other than chronic back pain  11/20/2019 This visit is a video telehealth visit for this patient who has chronic lung disease, hypertension, peripheral vascular disease, type 2 diabetes, chronic low back pain.  The patient states last several weeks and been difficult with change in weather being very hot and humid.  The patient notes he has had chest congestion cough and more shortness of breath.  Patient's not been using his Advair on a regular basis.  He is about out of tramadol will need refills soon.  He is requesting  a refill on his Maxalt.  He also would like mouthwash because his throat is irritated again.  He is yet to receive the Covid vaccine and is interested in this.  Patient states he is no longer on Basaglar insulin he is on Trulicity and Amaryl  The patient needs more glucose testing supplies  02/06/2020 telehealth visit for copd The patient can only connect by phone today.  He states he is having increased wheezing productive cough.  He is still smoking 1 to 2 cigarettes a day is trying to reduce this.  He would like a flu vaccine.  He did receive his Covid vaccine series.  He has some headaches and the mucus is yellow.  See a COPD assessment below   COPD He complains of cough and shortness of breath. There is no chest tightness, difficulty breathing, frequent throat clearing, hemoptysis, hoarse voice, sputum production or wheezing. This is a chronic problem. The current episode started more than 1 year ago. The problem occurs daily. The problem has been rapidly worsening. The cough is productive of sputum. Associated symptoms include dyspnea on exertion, headaches, nasal congestion, orthopnea and rhinorrhea. Pertinent negatives include no appetite change, chest pain, ear congestion, fever, heartburn, malaise/fatigue, myalgias, PND, postnasal drip, sneezing, sore throat, sweats or trouble swallowing. His  symptoms are aggravated by any activity, change in weather, climbing stairs, lying down, exposure to fumes, strenuous activity, emotional stress and minimal activity. His symptoms are alleviated by beta-agonist, oral steroids and steroid inhaler. Risk factors for lung disease include smoking/tobacco exposure. His past medical history is significant for COPD.   Past Medical History:  Diagnosis Date  . Back pain   . Bipolar disorder ([place])   . Cannabis use disorder, moderate, dependence ([place])   . Chronic pain   . Diabetes mellitus without complication ([place])   . Generalized anxiety disorder   . Headache   . Hypertension   . Schizoaffective disorder ([place])      Family History  Problem Relation Age of Onset  . Diabetes Mother   . Cancer Mother        unsure what type  . Heart disease Mother   . Hypertension Mother   . Dementia Father   . Diabetes Brother   . Dementia Maternal Grandfather   . Cancer Paternal Grandmother 52       breast  . Mental illness Neg Hx      Social History   Socioeconomic History  . Marital status: Married    Spouse name: Not on file  . Number of children: Not on file  . Years of education: Not on file  . Highest education level: Not on file  Occupational History  . Not on file  Tobacco Use  . Smoking status: Current Every Day Smoker    Packs/day: 2.00    Types: Cigarettes  . Smokeless tobacco: Never Used  Vaping Use  . Vaping Use: Never used  Substance and Sexual Activity  . Alcohol use: Not Currently  . Drug use: Yes    Types: Marijuana    Comment: "have changed to hemp now" 09/17/17  . Sexual activity: Not on file  Other Topics Concern  . Not on file  Social History Narrative  . Not on file   Social Determinants of Health   Financial Resource Strain:   . Difficulty of Paying Living Expenses: Not on file  Food Insecurity:   . Worried About Charity fundraiser in the Last Year: Not on file  .  Ran Out of Food in the Last Year: Not on file   Transportation Needs:   . Lack of Transportation (Medical): Not on file  . Lack of Transportation (Non-Medical): Not on file  Physical Activity:   . Days of Exercise per Week: Not on file  . Minutes of Exercise per Session: Not on file  Stress:   . Feeling of Stress : Not on file  Social Connections:   . Frequency of Communication with Friends and Family: Not on file  . Frequency of Social Gatherings with Friends and Family: Not on file  . Attends Religious Services: Not on file  . Active Member of Clubs or Organizations: Not on file  . Attends Archivist Meetings: Not on file  . Marital Status: Not on file  Intimate Partner Violence:   . Fear of Current or Ex-Partner: Not on file  . Emotionally Abused: Not on file  . Physically Abused: Not on file  . Sexually Abused: Not on file     Allergies  Allergen Reactions  . Imitrex [Sumatriptan] Other (See Comments)    mania     Outpatient Medications Prior to Visit  Medication Sig Dispense Refill  . albuterol (PROVENTIL) (2.5 MG/3ML) 0.083% nebulizer solution Take 3 mLs (2.5 mg total) by nebulization every 6 (six) hours as needed for wheezing or shortness of breath. 75 mL 1  . blood glucose meter kit and supplies KIT Dispense based on patient and insurance preference. Use up to four times daily as directed. ICD-10 E11.65  Z79.4 1 each 0  . blood glucose meter kit and supplies Dispense based on patient and insurance preference. Use up to four times daily as directed. (FOR ICD-10 E10.9, E11.9). 1 each 0  . Blood Glucose Monitoring Suppl (TRUE METRIX METER) w/Device KIT Check blood sugars fasting and at bedtime 1 kit 0  . diclofenac (VOLTAREN) 50 MG EC tablet TAKE 1 TABLET( 50 MG TOTAL) BY MOUTH 2 TIMES DAILY AS NEEDED FOR MODERATE PAIN, EAT BEFORE TAKING MEDICATION 60 tablet 1  . divalproex (DEPAKOTE ER) 500 MG 24 hr tablet Take 3 tablets (1,500 mg total) by mouth at bedtime. 90 tablet 11  . Fluticasone-Salmeterol (ADVAIR)  250-50 MCG/DOSE AEPB Inhale 1 puff into the lungs 2 (two) times daily. 1 each 6  . gabapentin (NEURONTIN) 300 MG capsule Take one pill twice per day and two at bedtime (Patient taking differently: Take 300-600 mg by mouth See admin instructions. Take one pill twice per day and two at bedtime) 120 capsule 5  . glimepiride (AMARYL) 2 MG tablet 1/2 pill (1 mg) once per day before the first meal of the day to control blood sugar 30 tablet 3  . glucose blood (TRUE METRIX BLOOD GLUCOSE TEST) test strip Use as instructed 100 each 12  . Insulin Pen Needle (TRUEPLUS PEN NEEDLES) 32G X 4 MM MISC Use as directed to inject insulin 100 each 11  . losartan-hydrochlorothiazide (HYZAAR) 100-25 MG tablet Take 1 tablet by mouth daily. 90 tablet 3  . Respiratory Therapy Supplies (FLUTTER) DEVI Use with 4 times daily 1 each 0  . rizatriptan (MAXALT) 10 MG tablet Take 1 tablet (10 mg total) by mouth as needed for migraine. May repeat in 2 hours if needed 10 tablet 3  . Spacer/Aero-Holding Chambers (AEROCHAMBER MV) inhaler Use as instructed 1 each 0  . traMADol (ULTRAM) 50 MG tablet Take 1 tablet (50 mg total) by mouth every 6 (six) hours as needed for moderate pain or severe  pain. 120 tablet 3  . TRUEplus Lancets 28G MISC Use to measure blood sugar twice a day 711 each 1  . TRULICITY 6.57 XU/3.8BF SOPN ADMINISTER 0.5 ML UNDER THE SKIN 1 TIME A WEEK 2 mL 2  . VENTOLIN HFA 108 (90 Base) MCG/ACT inhaler Inhale 2 puffs into the lungs every 4 (four) hours as needed for wheezing or shortness of breath. 18 g 3  . paliperidone (INVEGA SUSTENNA) 156 MG/ML SUSY injection Inject 1 mL (156 mg total) into the muscle once for 1 dose. Due 5/1 1 mL 11  . lidocaine (XYLOCAINE) 2 % solution     . magic mouthwash w/lidocaine SOLN Take 5 mLs by mouth 4 (four) times daily as needed for mouth pain. 240 mL 0  . promethazine-dextromethorphan (PROMETHAZINE-DM) 6.25-15 MG/5ML syrup Take 5 mLs by mouth 4 (four) times daily as needed for cough.  (Patient not taking: Reported on 01/28/2020) 118 mL 0  . tizanidine (ZANAFLEX) 2 MG capsule One or two at bedtime to help with headache/muscle spasm (Patient not taking: Reported on 01/28/2020) 60 capsule 1   No facility-administered medications prior to visit.    Review of Systems  Constitutional: Negative for appetite change, fever and malaise/fatigue.  HENT: Positive for rhinorrhea. Negative for hoarse voice, postnasal drip, sneezing, sore throat and trouble swallowing.   Respiratory: Positive for cough and shortness of breath. Negative for hemoptysis, sputum production and wheezing.   Cardiovascular: Positive for dyspnea on exertion. Negative for chest pain and PND.  Gastrointestinal: Negative for heartburn.  Musculoskeletal: Negative for myalgias.  Neurological: Positive for headaches.       Objective:   Physical Exam   There were no vitals filed for this visit. This was a telephone visit no exam     Assessment & Plan:  I personally reviewed all images and lab data in the [facility] system as well as any outside material available during this office visit and agree with the  radiology impressions.   COPD with chronic bronchitis ([place]) COPD with chronic bronchitis appears to be stable at this time  The patient's not using his Advair on a regular basis I indicated he must take it daily 1 inhalation twice a day  I do not believe steroids are indicated at this time  Patient will return in follow-up   Kazuo was seen today for copd.  Diagnoses and all orders for this visit:  COPD with chronic bronchitis ([place])     Follow Up Instructions: A follow-up in person visit will be obtained in November  I discussed the assessment and treatment plan with the patient. The patient was provided an opportunity to ask questions and all were answered. The patient agreed with the plan and demonstrated an understanding of the instructions.   The patient was advised to call back or seek an  in-person evaluation if the symptoms worsen or if the condition fails to improve as anticipated.  I provided 30 minutes of non-face-to-face time during this encounter  including  median intraservice time , review of notes, labs, imaging, medications  and explaining diagnosis and management to the patient .    Asencion Noble, MD

## 2020-02-06 NOTE — Assessment & Plan Note (Signed)
COPD with chronic bronchitis appears to be stable at this time  The patient's not using his Advair on a regular basis I indicated he must take it daily 1 inhalation twice a day  I do not believe steroids are indicated at this time  Patient will return in follow-up

## 2020-02-08 ENCOUNTER — Telehealth: Payer: Self-pay | Admitting: Family Medicine

## 2020-02-08 ENCOUNTER — Other Ambulatory Visit: Payer: Self-pay | Admitting: Family Medicine

## 2020-02-08 DIAGNOSIS — R058 Other specified cough: Secondary | ICD-10-CM

## 2020-02-08 DIAGNOSIS — M5442 Lumbago with sciatica, left side: Secondary | ICD-10-CM

## 2020-02-08 DIAGNOSIS — J4489 Other specified chronic obstructive pulmonary disease: Secondary | ICD-10-CM

## 2020-02-08 DIAGNOSIS — J449 Chronic obstructive pulmonary disease, unspecified: Secondary | ICD-10-CM

## 2020-02-08 DIAGNOSIS — G8929 Other chronic pain: Secondary | ICD-10-CM

## 2020-02-08 MED ORDER — PROMETHAZINE-DM 6.25-15 MG/5ML PO SYRP: 5.0000 mL | ORAL_SOLUTION | Freq: Four times a day (QID) | ORAL | 0 refills | Status: DC | PRN

## 2020-02-08 MED ORDER — GABAPENTIN 300 MG PO CAPS: ORAL_CAPSULE | ORAL | 1 refills | Status: DC

## 2020-02-08 NOTE — Telephone Encounter (Signed)
Medication: gabapentin (NEURONTIN) 300 MG capsule [[id number]]   Has the patient contacted their pharmacy? YES  (Agent: If no, request that the patient contact the pharmacy for the refill.) (Agent: If yes, when and what did the pharmacy advise?)  Preferred Pharmacy (with phone number or street name): [facility] [place], [place] [address] Phone: [phone] Fax: [phone] Hours: Not open 24 hours    Agent: Please be advised that RX refills may take up to 3 business days. We ask that you follow-up with your pharmacy.

## 2020-02-08 NOTE — Telephone Encounter (Signed)
Left message on voicemail.  Informed patient that his message and request has been received. Dr. Joya Gaskins is not in the office today but routed the message to Dr. Chapman Fitch to see if she could address the request.  Would have to wait for Dr. Joya Gaskins to address.

## 2020-02-08 NOTE — Telephone Encounter (Signed)
Requested Prescriptions  Pending Prescriptions Disp Refills   gabapentin (NEURONTIN) 300 MG capsule 360 capsule 1    Sig: Take one pill twice per day and two at bedtime     Neurology: Anticonvulsants - gabapentin Passed - 02/08/2020  2:51 PM      Passed - Valid encounter within last 12 months    Recent Outpatient Visits          2 days ago COPD with chronic bronchitis ([place])   [place] Elsie Stain, MD   1 week ago Lumbar radiculopathy   [place] Fulp, [place], MD   1 month ago Low back pain with radiation   [place], [place], MD   2 months ago COPD with chronic bronchitis [facility])   [place] Elsie Stain, MD   4 months ago Type 2 diabetes mellitus without complication, with long-term current use of insulin [facility])   [place], MD      Future Appointments            In 1 week Marybelle Killings, MD [place]   In 3 weeks Antony Blackbird, MD [place]   In 3 weeks Joya Gaskins Burnett Harry, MD [place]

## 2020-02-08 NOTE — Progress Notes (Signed)
Patient ID: Joshua [name], male   DOB: 06-04-1955, 64 y.o.   MRN: [id number]   Patient left message to request refill of cough medication that was previously prescribed by Dr. Joya Gaskins. Per note from 02/06/2020 visit with Dr. Joya Gaskins, new RX given for promethazine-DM.

## 2020-02-08 NOTE — Telephone Encounter (Signed)
Patient is calling back to check on the request. Preferred [place]

## 2020-02-08 NOTE — Telephone Encounter (Signed)
Based on patient's 02/06/2020 visit note, RX was sent into patient's pharmacy for promethazine-DM

## 2020-02-08 NOTE — Telephone Encounter (Signed)
Copied from [place] [phone]. Topic: General - Other >> Feb 08, 2020  8:41 AM Leward Quan A wrote: Reason for CRM:  Patient called in to inquire of Dr Joya Gaskins that per conversation on 02/06/20 he was to send an Rx to the pharmacy for cough medicine but it was never received. Asking if docter can please send Rx to pharmacy for this cough medicine and if nurse can call him to inform of the decision. Can be reached at  Ph# [phone]

## 2020-02-10 ENCOUNTER — Other Ambulatory Visit: Payer: Self-pay

## 2020-02-10 ENCOUNTER — Ambulatory Visit
Admission: RE | Admit: 2020-02-10 | Discharge: 2020-02-10 | Disposition: A | Discharge status: Home / Self Care | Payer: Medicare Other | Source: Ambulatory Visit | Attending: Orthopaedic Surgery | Admitting: Orthopaedic Surgery

## 2020-02-10 DIAGNOSIS — M545 Low back pain, unspecified: Secondary | ICD-10-CM

## 2020-02-10 DIAGNOSIS — M48061 Spinal stenosis, lumbar region without neurogenic claudication: Secondary | ICD-10-CM | POA: Diagnosis not present

## 2020-02-10 DIAGNOSIS — G8929 Other chronic pain: Secondary | ICD-10-CM

## 2020-02-13 NOTE — Telephone Encounter (Signed)
Called pt to f /u, unable to reach left VM to call back.

## 2020-02-14 ENCOUNTER — Ambulatory Visit: Payer: Medicare Other | Admitting: Family Medicine

## 2020-02-14 ENCOUNTER — Other Ambulatory Visit: Payer: Self-pay

## 2020-02-14 ENCOUNTER — Ambulatory Visit: Payer: Self-pay | Admitting: Family Medicine

## 2020-02-14 NOTE — Telephone Encounter (Signed)
Per initial encounter, "  FU with pt Pt had appt this am cancelled with Fulp, office offers no solution as not knowing when will return, pt uses Magic mouthwash and wants a cb as it is expired but thinks can still use. Pt seems a little thrown off as office has no other solution for resch with another provider not even for Nov appt just Urgent Care but pt is being extremely understanding cb on mouthwash [phone] "    Contacted pt regarding his concerns; he states: 1. Can he use medication after expiration date? Advised pt to not use medication after expiration date 2. He would like to have prescrpition sent in. Pt notified prescription was d/c'd 02/06/20. The pt says he does not have mouth sores at this time 3. He would like to have a script for medication that he saw on TV that heals your skin from the inside out  The pt would like to be informed about rescheduling his appt with Dr Antony Blackbird, [place]; the pt says his appt for 02/14/20 was cancelled because Dr Chapman Fitch had an emergency; he would like a callback; the pt can be contacted at [phone]. He also uses [facility] [address] [place]; will route to office for final disposition  Requested medication (s) are due for refill today: magic mouthwash with lidocaine, yes  Requested medication (s) are on the active medication list:no  Last refill: 11/20/19  Future visit scheduled: pt appt with Dr Chapman Fitch cancelled  Notes to clinic:  No assigned protocol     Reason for Disposition  [1] Prescription refill request for NON-ESSENTIAL medicine (i.e., no harm to patient if med not taken) AND [2] triager unable to refill per department policy  Answer Assessment - Initial Assessment Questions 1. DRUG NAME: "What medicine do you need to have refilled?"     Magic mouthwash with Lidocaine 2. REFILLS REMAINING: "How many refills are remaining?" (Note: The label on the medicine or pill bottle will show how many  refills are remaining. If there are no refills remaining, then a renewal may be needed.)    0 3. EXPIRATION DATE: "What is the expiration date?" (Note: The label states when the prescription will expire, and thus can no longer be refilled.)     d/c'd 02/06/20 4. PRESCRIBING HCP: "Who prescribed it?" Reason: If prescribed by specialist, call should be referred to that group.     *Dr Asencion Noble 5. SYMPTOMS: "Do you have any symptoms?"     no 6. PREGNANCY: "Is there any chance that you are pregnant?" "When was your last menstrual period?" n/a  Protocols used: MEDICATION REFILL AND RENEWAL CALL-A-AH

## 2020-02-15 ENCOUNTER — Telehealth: Payer: Self-pay | Admitting: Family Medicine

## 2020-02-15 ENCOUNTER — Ambulatory Visit: Payer: Medicare Other | Admitting: Orthopaedic Surgery

## 2020-02-15 NOTE — Telephone Encounter (Signed)
Copied from [place] [phone]. Topic: General - Other >> Feb 13, 2020 10:11 AM Rainey Pines A wrote: Patient wants to know if Dr. Chapman Fitch can send in a different migraine medication before his appt tomorrow. Patient feels that the  current migraine medication is not assisting him, Please adivse

## 2020-02-15 NOTE — Telephone Encounter (Signed)
Please have patient keep his upcoming appt to discuss a change in medication for his headaches

## 2020-02-18 ENCOUNTER — Emergency Department (HOSPITAL_COMMUNITY)
Admission: EM | Admit: 2020-02-18 | Discharge: 2020-02-18 | Disposition: A | Discharge status: Home / Self Care | Payer: Medicare Other | Attending: Emergency Medicine | Admitting: Emergency Medicine

## 2020-02-18 ENCOUNTER — Encounter (HOSPITAL_COMMUNITY): Payer: Self-pay | Admitting: Emergency Medicine

## 2020-02-18 ENCOUNTER — Emergency Department (HOSPITAL_COMMUNITY): Payer: Medicare Other

## 2020-02-18 ENCOUNTER — Other Ambulatory Visit: Payer: Self-pay

## 2020-02-18 ENCOUNTER — Telehealth: Payer: Self-pay

## 2020-02-18 DIAGNOSIS — R531 Weakness: Secondary | ICD-10-CM | POA: Diagnosis not present

## 2020-02-18 DIAGNOSIS — R29818 Other symptoms and signs involving the nervous system: Secondary | ICD-10-CM | POA: Diagnosis not present

## 2020-02-18 DIAGNOSIS — R200 Anesthesia of skin: Secondary | ICD-10-CM | POA: Diagnosis not present

## 2020-02-18 DIAGNOSIS — Z5321 Procedure and treatment not carried out due to patient leaving prior to being seen by health care provider: Secondary | ICD-10-CM | POA: Insufficient documentation

## 2020-02-18 LAB — CBC
HCT: 41.4 % (ref 39.0–52.0)
Hemoglobin: 13.8 g/dL (ref 13.0–17.0)
MCH: 32.7 pg (ref 26.0–34.0)
MCHC: 33.3 g/dL (ref 30.0–36.0)
MCV: 98.1 fL (ref 80.0–100.0)
Platelets: 110 10*3/uL — ABNORMAL LOW (ref 150–400)
RBC: 4.22 MIL/uL (ref 4.22–5.81)
RDW: 13.3 % (ref 11.5–15.5)
WBC: 5.5 10*3/uL (ref 4.0–10.5)
nRBC: 0 % (ref 0.0–0.2)

## 2020-02-18 LAB — COMPREHENSIVE METABOLIC PANEL
ALT: 30 U/L (ref 0–44)
AST: 26 U/L (ref 15–41)
Albumin: 3.4 g/dL — ABNORMAL LOW (ref 3.5–5.0)
Alkaline Phosphatase: 66 U/L (ref 38–126)
Anion gap: 10 (ref 5–15)
BUN: 11 mg/dL (ref 8–23)
CO2: 28 mmol/L (ref 22–32)
Calcium: 9.6 mg/dL (ref 8.9–10.3)
Chloride: 90 mmol/L — ABNORMAL LOW (ref 98–111)
Creatinine, Ser: 1.24 mg/dL (ref 0.61–1.24)
GFR, Estimated: >60 mL/min (ref >60)
Glucose, Bld: 182 mg/dL — ABNORMAL HIGH (ref 70–99)
Potassium: 4.3 mmol/L (ref 3.5–5.1)
Sodium: 128 mmol/L — ABNORMAL LOW (ref 135–145)
Total Bilirubin: 0.6 mg/dL (ref 0.3–1.2)
Total Protein: 6 g/dL — ABNORMAL LOW (ref 6.5–8.1)

## 2020-02-18 LAB — APTT: aPTT: 29 seconds (ref 24–36)

## 2020-02-18 LAB — DIFFERENTIAL
Abs Immature Granulocytes: 0.02 10*3/uL (ref 0.00–0.07)
Basophils Absolute: 0 10*3/uL (ref 0.0–0.1)
Basophils Relative: 1 %
Eosinophils Absolute: 0.1 10*3/uL (ref 0.0–0.5)
Eosinophils Relative: 2 %
Immature Granulocytes: 0 %
Lymphocytes Relative: 21 %
Lymphs Abs: 1.2 10*3/uL (ref 0.7–4.0)
Monocytes Absolute: 0.7 10*3/uL (ref 0.1–1.0)
Monocytes Relative: 12 %
Neutro Abs: 3.5 10*3/uL (ref 1.7–7.7)
Neutrophils Relative %: 64 %

## 2020-02-18 LAB — PROTIME-INR
INR: 1 (ref 0.8–1.2)
Prothrombin Time: 13.1 seconds (ref 11.4–15.2)

## 2020-02-18 NOTE — ED Triage Notes (Addendum)
Patient arrives to ED with complaints of weakness and numbness in all four extremities x3 days.States he stayed in bed all weekend because he felt paralyzed or like all extremities were "asleep". Denies CP but has mild SOB since.Pt states the symptoms resolved yesterday spontaneously. Pt currently has no weakness, speech, or vision deficits.

## 2020-02-18 NOTE — Telephone Encounter (Signed)
Please advise 

## 2020-02-18 NOTE — ED Notes (Signed)
Pt states that he leaving and will follow up with his doctor.

## 2020-02-18 NOTE — Telephone Encounter (Signed)
Called discussed. Has appt tomorrow to go over. Had weakness and chewing problem lasted short time. Discussed could be TIA . He will get seen tomorrow.

## 2020-02-18 NOTE — Telephone Encounter (Signed)
patient called he stated friday he was having pain in his back, he wasn't able to talk, arm and legs and hands felt tingly he stated hands still feel tingly.He stated he feels fine right now. He stated he felt like he was "paralyzed" he is requesting a phone call to go over MRI Results. Call back:[phone]

## 2020-02-19 ENCOUNTER — Encounter: Payer: Self-pay | Admitting: Orthopaedic Surgery

## 2020-02-19 ENCOUNTER — Ambulatory Visit (INDEPENDENT_AMBULATORY_CARE_PROVIDER_SITE_OTHER): Payer: Medicare Other | Admitting: Orthopaedic Surgery

## 2020-02-19 DIAGNOSIS — M48061 Spinal stenosis, lumbar region without neurogenic claudication: Secondary | ICD-10-CM | POA: Insufficient documentation

## 2020-02-19 DIAGNOSIS — M48062 Spinal stenosis, lumbar region with neurogenic claudication: Secondary | ICD-10-CM | POA: Diagnosis not present
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
## 2020-02-22 ENCOUNTER — Ambulatory Visit: Payer: Self-pay

## 2020-02-22 ENCOUNTER — Telehealth: Payer: Self-pay | Admitting: Family Medicine

## 2020-02-22 NOTE — Telephone Encounter (Signed)
Attempt to call patient.  LVM asking to return call.   Does he need refills?  Is he taking medication for breathing?  Smoking?    Will attempt to return call patient back today.

## 2020-02-22 NOTE — Telephone Encounter (Signed)
  Pt. Reports he has had increasing shortness of breath and productive cough x 1 week. Mucus is yellow. Shortness of breath mainly with exertion. No availability. Refuses ED/UC. Please advise pt. Reason for Disposition . [1] MODERATE longstanding difficulty breathing (e.g., speaks in phrases, SOB even at rest, pulse 100-120) AND [2] SAME as normal  Answer Assessment - Initial Assessment Questions 1. RESPIRATORY STATUS: "Describe your breathing?" (e.g., wheezing, shortness of breath, unable to speak, severe coughing)      Shortness of breath 2. ONSET: "When did this breathing problem begin?"      1 week ago 3. PATTERN "Does the difficult breathing come and go, or has it been constant since it started?"      Constant 4. SEVERITY: "How bad is your breathing?" (e.g., mild, moderate, severe)    - MILD: No SOB at rest, mild SOB with walking, speaks normally in sentences, can lay down, no retractions, pulse < 100.    - MODERATE: SOB at rest, SOB with minimal exertion and prefers to sit, cannot lie down flat, speaks in phrases, mild retractions, audible wheezing, pulse 100-120.    - SEVERE: Very SOB at rest, speaks in single words, struggling to breathe, sitting hunched forward, retractions, pulse > 120      Moderate 5. RECURRENT SYMPTOM: "Have you had difficulty breathing before?" If Yes, ask: "When was the last time?" and "What happened that time?"      Yes 6. CARDIAC HISTORY: "Do you have any history of heart disease?" (e.g., heart attack, angina, bypass surgery, angioplasty)      No 7. LUNG HISTORY: "Do you have any history of lung disease?"  (e.g., pulmonary embolus, asthma, emphysema)     COPD 8. CAUSE: "What do you think is causing the breathing problem?"      Unsure 9. OTHER SYMPTOMS: "Do you have any other symptoms? (e.g., dizziness, runny nose, cough, chest pain, fever)     Cough- yellow mucus 10. PREGNANCY: "Is there any chance you are pregnant?" "When was your last menstrual period?"        n/a 11. TRAVEL: "Have you traveled out of the country in the last month?" (e.g., travel history, exposures)       No  Protocols used: BREATHING DIFFICULTY-A-AH

## 2020-02-22 NOTE — Telephone Encounter (Signed)
Patient missed call from [place] Regarding message from NT regarding SOB and cough. [place]- [phone]

## 2020-02-24 ENCOUNTER — Other Ambulatory Visit: Payer: Self-pay | Admitting: Family Medicine

## 2020-02-24 DIAGNOSIS — R930 Abnormal findings on diagnostic imaging of skull and head, not elsewhere classified: Secondary | ICD-10-CM

## 2020-02-24 DIAGNOSIS — Z01818 Encounter for other preprocedural examination: Secondary | ICD-10-CM

## 2020-02-24 DIAGNOSIS — G8929 Other chronic pain: Secondary | ICD-10-CM

## 2020-02-24 DIAGNOSIS — R519 Headache, unspecified: Secondary | ICD-10-CM

## 2020-02-24 DIAGNOSIS — M545 Low back pain, unspecified: Secondary | ICD-10-CM

## 2020-02-24 NOTE — Progress Notes (Signed)
Patient ID: Joshua [name], male   DOB: 1956-04-06, 64 y.o.   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  No associated mass-effect.  Patient is also being followed by orthopedics due to chronic back pain from lumbar spinal stenosis with potential plan for surgery.  Patient will be referred to neurology for further follow-up of his headaches and abnormality on CT scan prior to planned back surgery.

## 2020-02-24 NOTE — Progress Notes (Signed)
Patient ID: Joshua [name], male   DOB: [birth year], 64 y.o.   MRN: [id number]   Patient has been followed by Dr. Rodell Perna in orthopedics regarding lumbar spinal stenosis.  Dr. Lorin Mercy would like for patient to have preoperative cardiovascular visit for examination to determine if patient will be a candidate for surgical intervention for treatment of his spinal stenosis and chronic back pain.  Cardiology referral is being placed and patient's wife will be notified.

## 2020-02-25 NOTE — Telephone Encounter (Signed)
-----   Message from Antony Blackbird, MD sent at 02/24/2020 10:31 PM EDT ----- Regarding: FW: [facility] patient with upcoming appt Can you contact patient's wife who should be listed as patient's contact as patient has cognitive issues/impaired memory due to mental health disorder.  Let patient's wife know that patient has been referred to neurology due to his recurrent headaches and recent emergency department visit.  Also, patient's orthopedic doctor would like for patient to see a cardiologist for a preoperative screening to see if patient is a candidate for spinal surgery to help with his chronic back pain.  Referrals have been placed and please have wife contact this office if she has not heard anything regarding neurology and cardiology referrals within the next 2 weeks. ----- Message ----- From: Elsie Stain, MD Sent: 02/21/2020   6:23 AM EDT To: Antony Blackbird, MD Subject: RE: [facility] patient with upcoming appt          Cammie  will you order these.   I covered him while you were out, do you want me to ordre this or you?  thnks ----- Message ----- From: Antony Blackbird, MD Sent: 02/20/2020   9:14 PM EDT To: Elsie Stain, MD Subject: [facility] patient with upcoming appt              See Ortho note; patient may need Neurology eval regarding abnormal imaging in ED and may need cardiovascular clearance as well to see if he can have surgery for spinal stenosis ----- Message ----- From: Marybelle Killings, MD Sent: 02/19/2020   3:55 PM EDT To: Antony Blackbird, MD

## 2020-02-25 NOTE — Telephone Encounter (Signed)
Att to contact spouse [phone] to advise of provider note..No ans lvm to call ofc

## 2020-02-28 ENCOUNTER — Ambulatory Visit: Payer: Medicare Other | Admitting: Neurology

## 2020-03-03 ENCOUNTER — Encounter: Payer: Self-pay | Admitting: Family Medicine

## 2020-03-03 ENCOUNTER — Ambulatory Visit: Payer: Medicare Other | Admitting: Family Medicine

## 2020-03-03 ENCOUNTER — Ambulatory Visit: Payer: Medicare Other | Attending: Family Medicine | Admitting: Family Medicine

## 2020-03-03 ENCOUNTER — Other Ambulatory Visit: Payer: Self-pay

## 2020-03-03 VITALS — BP 135/85 | HR 94 | Wt 250.2 lb

## 2020-03-03 DIAGNOSIS — Z7951 Long term (current) use of inhaled steroids: Secondary | ICD-10-CM | POA: Insufficient documentation

## 2020-03-03 DIAGNOSIS — G8929 Other chronic pain: Secondary | ICD-10-CM

## 2020-03-03 DIAGNOSIS — Z7984 Long term (current) use of oral hypoglycemic drugs: Secondary | ICD-10-CM | POA: Insufficient documentation

## 2020-03-03 DIAGNOSIS — G43909 Migraine, unspecified, not intractable, without status migrainosus: Secondary | ICD-10-CM | POA: Diagnosis not present

## 2020-03-03 DIAGNOSIS — M79604 Pain in right leg: Secondary | ICD-10-CM | POA: Insufficient documentation

## 2020-03-03 DIAGNOSIS — R519 Headache, unspecified: Secondary | ICD-10-CM

## 2020-03-03 DIAGNOSIS — S0990XS Unspecified injury of head, sequela: Secondary | ICD-10-CM | POA: Diagnosis not present

## 2020-03-03 DIAGNOSIS — J441 Chronic obstructive pulmonary disease with (acute) exacerbation: Secondary | ICD-10-CM | POA: Diagnosis not present

## 2020-03-03 DIAGNOSIS — Z79899 Other long term (current) drug therapy: Secondary | ICD-10-CM | POA: Insufficient documentation

## 2020-03-03 DIAGNOSIS — M545 Low back pain, unspecified: Secondary | ICD-10-CM | POA: Diagnosis not present

## 2020-03-03 DIAGNOSIS — F17210 Nicotine dependence, cigarettes, uncomplicated: Secondary | ICD-10-CM | POA: Diagnosis not present

## 2020-03-03 DIAGNOSIS — M79605 Pain in left leg: Secondary | ICD-10-CM | POA: Diagnosis not present

## 2020-03-03 DIAGNOSIS — E1165 Type 2 diabetes mellitus with hyperglycemia: Secondary | ICD-10-CM

## 2020-03-03 DIAGNOSIS — Z791 Long term (current) use of non-steroidal anti-inflammatories (NSAID): Secondary | ICD-10-CM | POA: Insufficient documentation

## 2020-03-03 DIAGNOSIS — E871 Hypo-osmolality and hyponatremia: Secondary | ICD-10-CM | POA: Diagnosis not present

## 2020-03-03 DIAGNOSIS — Z794 Long term (current) use of insulin: Secondary | ICD-10-CM

## 2020-03-03 DIAGNOSIS — R930 Abnormal findings on diagnostic imaging of skull and head, not elsewhere classified: Secondary | ICD-10-CM

## 2020-03-03 DIAGNOSIS — Z09 Encounter for follow-up examination after completed treatment for conditions other than malignant neoplasm: Secondary | ICD-10-CM

## 2020-03-03 DIAGNOSIS — X58XXXS Exposure to other specified factors, sequela: Secondary | ICD-10-CM | POA: Diagnosis not present

## 2020-03-03 DIAGNOSIS — R051 Acute cough: Secondary | ICD-10-CM | POA: Diagnosis not present

## 2020-03-03 DIAGNOSIS — R9402 Abnormal brain scan: Secondary | ICD-10-CM | POA: Insufficient documentation

## 2020-03-03 MED ORDER — GUAIFENESIN-CODEINE 100-10 MG/5ML PO SOLN: 5.0000 mL | Freq: Three times a day (TID) | ORAL | 0 refills | Status: DC | PRN

## 2020-03-03 MED ORDER — PREDNISONE 10 MG PO TABS: ORAL_TABLET | ORAL | 0 refills | Status: DC

## 2020-03-03 MED ORDER — ACCU-CHEK GUIDE VI STRP: ORAL_STRIP | 11 refills | Status: DC

## 2020-03-03 MED ORDER — ACCU-CHEK FASTCLIX LANCETS MISC: 11 refills | Status: DC

## 2020-03-03 MED ORDER — ACCU-CHEK FASTCLIX LANCET KIT: PACK | 11 refills | Status: AC

## 2020-03-03 MED ORDER — DOXYCYCLINE HYCLATE 100 MG PO TABS: 100.0000 mg | ORAL_TABLET | Freq: Two times a day (BID) | ORAL | 0 refills | Status: DC

## 2020-03-03 MED ORDER — ACCU-CHEK GUIDE ME W/DEVICE KIT: PACK | 0 refills | Status: AC
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
## 2020-03-03 NOTE — Patient Instructions (Signed)
PLEASE RESCHEDULE YOUR APPOINTMENT WITH NEUROLOGY AS SOON AS POSSIBLE

## 2020-03-03 NOTE — Progress Notes (Signed)
SOB  WANTS CT SCAN ON HEAD FROM 10/25

## 2020-03-04 LAB — BASIC METABOLIC PANEL WITH GFR
BUN/Creatinine Ratio: 19 (ref 10–24)
BUN: 24 mg/dL (ref 8–27)
CO2: 24 mmol/L (ref 20–29)
Calcium: 9.7 mg/dL (ref 8.6–10.2)
Chloride: 95 mmol/L — ABNORMAL LOW (ref 96–106)
Creatinine, Ser: 1.26 mg/dL (ref 0.76–1.27)
GFR calc Af Amer: 70 mL/min/1.73
GFR calc non Af Amer: 61 mL/min/1.73
Glucose: 178 mg/dL — ABNORMAL HIGH (ref 65–99)
Potassium: 5.2 mmol/L (ref 3.5–5.2)
Sodium: 134 mmol/L (ref 134–144)

## 2020-03-04 LAB — HEMOGLOBIN A1C
Est. average glucose Bld gHb Est-mCnc: 169 mg/dL
Hgb A1c MFr Bld: 7.5 % — ABNORMAL HIGH (ref 4.8–5.6)

## 2020-03-06 ENCOUNTER — Encounter: Payer: Self-pay | Admitting: Critical Care Medicine

## 2020-03-06 ENCOUNTER — Other Ambulatory Visit: Payer: Self-pay | Admitting: Critical Care Medicine

## 2020-03-06 ENCOUNTER — Other Ambulatory Visit: Payer: Self-pay

## 2020-03-06 ENCOUNTER — Ambulatory Visit: Payer: Medicare Other | Attending: Critical Care Medicine | Admitting: Critical Care Medicine

## 2020-03-06 VITALS — BP 135/84 | HR 97 | Temp 98.2°F | Resp 20 | Ht 69.0 in | Wt 252.0 lb

## 2020-03-06 DIAGNOSIS — Z7952 Long term (current) use of systemic steroids: Secondary | ICD-10-CM | POA: Insufficient documentation

## 2020-03-06 DIAGNOSIS — Z791 Long term (current) use of non-steroidal anti-inflammatories (NSAID): Secondary | ICD-10-CM | POA: Insufficient documentation

## 2020-03-06 DIAGNOSIS — M545 Low back pain, unspecified: Secondary | ICD-10-CM | POA: Diagnosis not present

## 2020-03-06 DIAGNOSIS — Z794 Long term (current) use of insulin: Secondary | ICD-10-CM | POA: Diagnosis not present

## 2020-03-06 DIAGNOSIS — G8929 Other chronic pain: Secondary | ICD-10-CM | POA: Insufficient documentation

## 2020-03-06 DIAGNOSIS — I10 Essential (primary) hypertension: Secondary | ICD-10-CM | POA: Diagnosis not present

## 2020-03-06 DIAGNOSIS — Z8249 Family history of ischemic heart disease and other diseases of the circulatory system: Secondary | ICD-10-CM | POA: Insufficient documentation

## 2020-03-06 DIAGNOSIS — E1151 Type 2 diabetes mellitus with diabetic peripheral angiopathy without gangrene: Secondary | ICD-10-CM | POA: Insufficient documentation

## 2020-03-06 DIAGNOSIS — Z7951 Long term (current) use of inhaled steroids: Secondary | ICD-10-CM | POA: Insufficient documentation

## 2020-03-06 DIAGNOSIS — Z760 Encounter for issue of repeat prescription: Secondary | ICD-10-CM | POA: Diagnosis not present

## 2020-03-06 DIAGNOSIS — J449 Chronic obstructive pulmonary disease, unspecified: Secondary | ICD-10-CM | POA: Diagnosis present

## 2020-03-06 DIAGNOSIS — F259 Schizoaffective disorder, unspecified: Secondary | ICD-10-CM | POA: Diagnosis not present

## 2020-03-06 DIAGNOSIS — Z79899 Other long term (current) drug therapy: Secondary | ICD-10-CM | POA: Diagnosis not present

## 2020-03-06 DIAGNOSIS — Z833 Family history of diabetes mellitus: Secondary | ICD-10-CM | POA: Insufficient documentation

## 2020-03-06 DIAGNOSIS — F17210 Nicotine dependence, cigarettes, uncomplicated: Secondary | ICD-10-CM | POA: Diagnosis not present

## 2020-03-06 DIAGNOSIS — Z720 Tobacco use: Secondary | ICD-10-CM

## 2020-03-06 DIAGNOSIS — R609 Edema, unspecified: Secondary | ICD-10-CM | POA: Diagnosis not present

## 2020-03-06 DIAGNOSIS — R262 Difficulty in walking, not elsewhere classified: Secondary | ICD-10-CM | POA: Diagnosis not present

## 2020-03-06 DIAGNOSIS — E1165 Type 2 diabetes mellitus with hyperglycemia: Secondary | ICD-10-CM | POA: Insufficient documentation

## 2020-03-06 DIAGNOSIS — F319 Bipolar disorder, unspecified: Secondary | ICD-10-CM | POA: Insufficient documentation

## 2020-03-06 LAB — GLUCOSE, POCT (MANUAL RESULT ENTRY): POC Glucose: 225 mg/dl — AB (ref 70–99)

## 2020-03-06 MED ORDER — IPRATROPIUM-ALBUTEROL 0.5-2.5 (3) MG/3ML IN SOLN
3.0000 mL | Freq: Four times a day (QID) | RESPIRATORY_TRACT | 0 refills | Status: DC
Start: 2020-03-06 — End: 2020-04-02

## 2020-03-06 MED ORDER — CIPROFLOXACIN HCL 500 MG PO TABS: 500.0000 mg | ORAL_TABLET | Freq: Two times a day (BID) | ORAL | 0 refills | Status: DC

## 2020-03-06 MED ORDER — IPRATROPIUM-ALBUTEROL 0.5-2.5 (3) MG/3ML IN SOLN
3.0000 mL | Freq: Four times a day (QID) | RESPIRATORY_TRACT | 1 refills | Status: DC
Start: 2020-03-06 — End: 2020-03-06

## 2020-03-06 MED FILL — IPRAT-ALBUT 0.5-3(2.5) MG/3: 0.5-2.5 (3) | 30 days supply | Qty: 360 | Fill #0

## 2020-03-06 NOTE — Assessment & Plan Note (Signed)
  .   Current smoking consumption amount: 1 pack a day  . Dicsussion on advise to quit smoking and smoking impacts: Impacts on lung health  . Patient's willingness to quit: Not willing to completely quit  . Methods to quit smoking discussed: Behavioral modification  . Medication management of smoking session drugs discussed: Has tried and failed nicotine replacement  . Resources provided:  AVS   . Setting quit date not yet established  . Follow-up arranged 3 weeks   Time spent counseling the patient: 5 minutes   

## 2020-03-06 NOTE — Patient Instructions (Addendum)
Finish the current antibiotic and when finished begin Cipro 1 twice daily for 5 days for your infection and was sent to the [facility] which is a combination albuterol and ipratropium and use it in your nebulizer 4 times daily on schedule, Medicaid will cover this medication and was sent to the [facility]  Please take your Amaryl medication every morning and continue the Trulicity weekly  Obtain a Chest xray  Reduce alcohol consumption   Return to Dr Joya Gaskins in 3 weeks   Tobacco Use Disorder Tobacco use disorder (TUD) occurs when a person craves, seeks, and uses tobacco, regardless of the consequences. This disorder can cause problems with mental and physical health. It can affect your ability to have healthy relationships, and it can keep you from meeting your responsibilities at work, home, or school. Tobacco may be:  Smoked as a cigarette or cigar.  Inhaled using e-cigarettes.  Smoked in a pipe or hookah.  Chewed as smokeless tobacco.  Inhaled into the nostrils as snuff. Tobacco products contain a dangerous chemical called nicotine, which is very addictive. Nicotine triggers hormones that make the body feel stimulated and works on areas of the brain that make you feel good. These effects can make it hard for people to quit nicotine. Tobacco contains many other unsafe chemicals that can damage almost every organ in the body. Smoking tobacco also puts others in danger due to fire risk and possible health problems caused by breathing in secondhand smoke. What are the signs or symptoms? Symptoms of TUD may include:  Being unable to slow down or stop your tobacco use.  Spending an abnormal amount of time getting or using tobacco.  Craving tobacco. Cravings may last for up to 6 months after quitting.  Tobacco use that: ? Interferes with your work, school, or home life. ? Interferes with your personal and social relationships. ? Makes you give up  activities that you once enjoyed or found important.  Using tobacco even though you know that it is: ? Dangerous or bad for your health or someone else's health. ? Causing problems in your life.  Needing more and more of the substance to get the same effect (developing tolerance).  Experiencing unpleasant symptoms if you do not use the substance (withdrawal). Withdrawal symptoms may include: ? Depressed, anxious, or irritable mood. ? Difficulty concentrating. ? Increased appetite. ? Restlessness or trouble sleeping.  Using the substance to avoid withdrawal. How is this diagnosed? This condition may be diagnosed based on:  Your current and past tobacco use. Your health care provider may ask questions about how your tobacco use affects your life.  A physical exam. You may be diagnosed with TUD if you have at least two symptoms within a 8-month period. How is this treated? This condition is treated by stopping tobacco use. Many people are unable to quit on their own and need help. Treatment may include:  Nicotine replacement therapy (NRT). NRT provides nicotine without the other harmful chemicals in tobacco. NRT gradually lowers the dosage of nicotine in the body and reduces withdrawal symptoms. NRT is available as: ? Over-the-counter gums, lozenges, and skin patches. ? Prescription mouth inhalers and nasal sprays.  Medicine that acts on the brain to reduce cravings and withdrawal symptoms.  A type of talk therapy that examines your triggers for tobacco use, how to avoid them, and how to cope with cravings (behavioral therapy).  Hypnosis. This may help with withdrawal symptoms.  Joining a support group  for others coping with TUD. The best treatment for TUD is usually a combination of medicine, talk therapy, and support groups. Recovery can be a long process. Many people start using tobacco again after stopping (relapse). If you relapse, it does not mean that treatment will not  work. Follow these instructions at home:  Lifestyle  Do not use any products that contain nicotine or tobacco, such as cigarettes and e-cigarettes.  Avoid things that trigger tobacco use as much as you can. Triggers include people and situations that usually cause you to use tobacco.  Avoid drinks that contain caffeine, including coffee. These may worsen some withdrawal symptoms.  Find ways to manage stress. Wanting to smoke may cause stress, and stress can make you want to smoke. Relaxation techniques such as deep breathing, meditation, and yoga may help.  Attend support groups as needed. These groups are an important part of long-term recovery for many people. General instructions  Take over-the-counter and prescription medicines only as told by your health care provider.  Check with your health care provider before taking any new prescription or over-the-counter medicines.  Decide on a friend, family member, or smoking quit-line (such as 1-800-QUIT-NOW in the [place] that you can call or text when you feel the urge to smoke or when you need help coping with cravings.  Keep all follow-up visits as told by your health care provider and therapist. This is important. Contact a health care provider if:  You are not able to take your medicines as prescribed.  Your symptoms get worse, even with treatment. Summary  Tobacco use disorder (TUD) occurs when a person craves, seeks, and uses tobacco regardless of the consequences.  This condition may be diagnosed based on your current and past tobacco use and a physical exam.  Many people are unable to quit on their own and need help. Recovery can be a long process.  The most effective treatment for TUD is usually a combination of medicine, talk therapy, and support groups. This information is not intended to replace advice given to you by your health care provider. Make sure you discuss any questions you have with your health care  provider. Document Revised: 03/30/2017 Document Reviewed: 03/30/2017 Elsevier Patient Education  2020 [employer].

## 2020-03-06 NOTE — Progress Notes (Signed)
Subjective:    Patient ID: Joshua [name], male    DOB: [birth year], 64 y.o.   MRN: [id number]   03/27/19 This is a video telehealth visit for a patient who has underlying COPD and was needing further follow-up upon referral from the patient's primary care provider.  The patient's been on several rounds of prednisone antibiotics with only improvement while on prednisone.  He states he is short of breath at rest and with exertion.  He has had problems with breathing for many years got worse over the past year.  He also has underlying diabetes on Trulicity and insulin long-acting.  He also states he has chronic low back pain and has difficulty ambulating this affects his breathing as well.  His weight is gone up as well which is aggravated his chronic pain.  He had a pain management doctor but has not seen that doctor since November 2019.  The patient is on tramadol from an urgent care visit he states this is helped to some degree.  He states nonsteroidals and gabapentin have not been of use in the past.  He also states that he has increased fluid retention and is questioning whether his losartan can be adjusted to accommodate reduction in fluid retention.  He still smokes 2 pack a day of cigarettes and previous smoked as much as 4 packs a day.  He has not had a pulmonary function test.  Chest x-ray in July of this year showed no active disease.  The patient does have a Symbicort inhaler and is using this however only on an as needed basis along with albuterol which is as needed  Note when I tried to do a medication reconciliation the patient was not clear on which medicines he was taking and which ones he had stopped and he has a list of nearly 40 medicines in his E HR.  04/10/2019 This is a follow-up visit from a video visit that occurred recently.  The patient was given doxycycline and pulse prednisone for COPD exacerbation and is maintaining Symbicort daily.  The patient was referred for the  COPD evaluation.  I also refill the patient's tramadol at the last visit.  He has pending pulmonary function test which have yet to be obtained.  He does state that his back is improved on the tramadol.  He also states that he has broken teeth and they will bite into his gum and mouth causing pain in the mouth.  The tramadol helps to some degree but is requesting a mouthwash.  He has not seen a dentist recently.  Patient is still smoking 2 packs a day of cigarettes.  The patient did bring all his current active medications in with him today and I did a complete medication reconciliation see is current new medication list  04/17/2019 Video visit request re dyspnea Copd  This patient was last seen in person on December 15 and since that time the patient has had recurrent exacerbation of COPD.  He has been using his Symbicort daily.  He states he awoke with acute shortness of breath and lung congestion.  This occurred 2 days ago.  He also had increased pain in the right side.  He has been coughing up profuse thick yellow mucus.  He is using the flutter valve and the nebulizer which helps to remove some of the mucus.  His mouth pain is improved with the [facility]ukes mouthwash and would like a refill.  He is also out of his tramadol and  is yet to achieve a chronic pain management appointment.  There is no fever.  Note he just had a negative Covid test December 18 in preparation for pulmonary function studies which have yet to be scheduled  1/18: This is a follow-up from previous visit in December that was a video visit.  The patient did receive a round of Levaquin and prednisone and he improved to some degree.  He only has albuterol for his nebulizer and is about to run out.  The patient worsened since he ran out of prednisone and the antibiotic.  He still has a lot of phlegm that is difficult to raise.  He is not using his flutter valve every day.  He continues to smoke 1 pack a day of cigarettes but previously  was at 2 packs a day The patient notes pressure in the frontal and sinus area and he denies any ear pain  Note his GAD-7 score is 6 and PHQ-9 score is 9 today  He states he cannot afford the co-pay at this time of the year with his insurance.  He has run out of insulin for the past 10 days and would prefer to have his medications filled at our pharmacy  The patient also had a referral to [facility] pain center but they declined his case.  Patient's been taking 50 mg 3 times daily of the tramadol  06/28/2019 Telephone visit: Since the last visit the patient states he still having shortness of breath but his cough and mucus production as well as breathing are better since taking the course of antibiotics and prednisone.  He is now down to 1 pack a day from 3 packs a day of cigarettes.  He states he still has no energy.  He states his lower back pain is under reasonable control with tramadol.  The patient is on Trulicity and insulin glargine for his diabetes but has run out of testing strips.  He also wishes a refill on his Maxalt for his migraines.  The patient maintains his inhaled medications without changes.  09/18/2019 Patient seen in return follow-up and his shortness of breath is much improved.  He is coughing less.  He is smoking less.  He has no new complaints other than chronic back pain  11/20/2019 This visit is a video telehealth visit for this patient who has chronic lung disease, hypertension, peripheral vascular disease, type 2 diabetes, chronic low back pain.  The patient states last several weeks and been difficult with change in weather being very hot and humid.  The patient notes he ha s had chest congestion cough and more shortness of breath.  Patient's not been using his Advair on a regular basis.  He is about out of tramadol will need refills soon.  He is requesting a refill on his Maxalt.  He also would like mouthwash because his throat is irritated again.  He is yet to receive the  Covid vaccine and is interested in this.  Patient states he is no longer on Basaglar insulin he is on Trulicity and Amaryl  The patient needs more glucose testing supplies  02/06/2020 telehealth visit for copd The patient can only connect by phone today.  He states he is having increased wheezing productive cough.  He is still smoking 1 to 2 cigarettes a day is trying to reduce this.  He would like a flu vaccine.  He did receive his Covid vaccine series.  He has some headaches and the mucus is yellow.  See  a COPD assessment below  03/06/2020 PCP patient of dr fulp   Here for Copd f/u Patient states his shortness of breath continues.  He was just seen by primary care and given a course of doxycycline he just started on the ninth.  He had yellow-green mucus notes clear but thick foamy.  He is still short of breath minimal exertion.  He states prednisone causes his sugars to go up and he is 275 on arrival today with his glucose he has not been taking his Amaryl regularly.  He states prednisone causes side effects.  He still smoking a pack a day of cigarettes.  See shortness of breath assessment below  The patient states he is unable to take a deep breath with the Advair inhaler at this time      COPD He complains of cough, shortness of breath, sputum production and wheezing. There is no chest tightness, difficulty breathing, frequent throat clearing, hemoptysis or hoarse voice. This is a chronic problem. The current episode started more than 1 year ago. The problem occurs daily. The problem has been rapidly worsening. The cough is productive of sputum and productive of purulent sputum. Associated symptoms include dyspnea on exertion, headaches, nasal congestion, orthopnea and rhinorrhea. Pertinent negatives include no appetite change, chest pain, ear congestion, fever, heartburn, malaise/fatigue, myalgias, PND, postnasal drip, sneezing, sore throat, sweats or trouble swallowing. His symptoms are  aggravated by any activity, change in weather, climbing stairs, lying down, exposure to fumes, strenuous activity, emotional stress and minimal activity. His symptoms are alleviated by beta-agonist, oral steroids and steroid inhaler. Risk factors for lung disease include smoking/tobacco exposure. His past medical history is significant for COPD.   Past Medical History:  Diagnosis Date  . Back pain   . Bipolar disorder ([place])   . Cannabis use disorder, moderate, dependence ([place])   . Chronic pain   . Diabetes mellitus without complication ([place])   . Generalized anxiety disorder   . Headache   . Hypertension   . Schizoaffective disorder ([place])      Family History  Problem Relation Age of Onset  . Diabetes Mother   . Cancer Mother        unsure what type  . Heart disease Mother   . Hypertension Mother   . Dementia Father   . Diabetes Brother   . Dementia Maternal Grandfather   . Cancer Paternal Grandmother 67       breast  . Mental illness Neg Hx      Social History   Socioeconomic History  . Marital status: Married    Spouse name: Not on file  . Number of children: Not on file  . Years of education: Not on file  . Highest education level: Not on file  Occupational History  . Not on file  Tobacco Use  . Smoking status: Current Every Day Smoker    Packs/day: 2.00    Types: Cigarettes  . Smokeless tobacco: Never Used  Vaping Use  . Vaping Use: Never used  Substance and Sexual Activity  . Alcohol use: Not Currently  . Drug use: Yes    Types: Marijuana    Comment: "have changed to hemp now" 09/17/17  . Sexual activity: Not on file  Other Topics Concern  . Not on file  Social History Narrative  . Not on file   Social Determinants of Health   Financial Resource Strain:   . Difficulty of Paying Living Expenses: Not on file  Food Insecurity:   .  Worried About Charity fundraiser in the Last Year: Not on file  . Ran Out of Food in the Last Year: Not on file   Transportation Needs:   . Lack of Transportation (Medical): Not on file  . Lack of Transportation (Non-Medical): Not on file  Physical Activity:   . Days of Exercise per Week: Not on file  . Minutes of Exercise per Session: Not on file  Stress:   . Feeling of Stress : Not on file  Social Connections:   . Frequency of Communication with Friends and Family: Not on file  . Frequency of Social Gatherings with Friends and Family: Not on file  . Attends Religious Services: Not on file  . Active Member of Clubs or Organizations: Not on file  . Attends Archivist Meetings: Not on file  . Marital Status: Not on file  Intimate Partner Violence:   . Fear of Current or Ex-Partner: Not on file  . Emotionally Abused: Not on file  . Physically Abused: Not on file  . Sexually Abused: Not on file     No Active Allergies   Outpatient Medications Prior to Visit  Medication Sig Dispense Refill  . Accu-Chek FastClix Lancets MISC Use to check blood sugars three times per day 100 each 11  . blood glucose meter kit and supplies KIT Dispense based on patient and insurance preference. Use up to four times daily as directed. ICD-10 E11.65  Z79.4 1 each 0  . blood glucose meter kit and supplies Dispense based on patient and insurance preference. Use up to four times daily as directed. (FOR ICD-10 E10.9, E11.9). 1 each 0  . Blood Glucose Monitoring Suppl (ACCU-CHEK GUIDE ME) w/Device KIT Use to check blood sugars up to 3 times per day. ICD-10 E11.65, Z79.4 1 kit 0  . diclofenac (VOLTAREN) 50 MG EC tablet TAKE 1 TABLET( 50 MG TOTAL) BY MOUTH 2 TIMES DAILY AS NEEDED FOR MODERATE PAIN, EAT BEFORE TAKING MEDICATION 60 tablet 1  . divalproex (DEPAKOTE ER) 500 MG 24 hr tablet Take 3 tablets (1,500 mg total) by mouth at bedtime. 90 tablet 11  . doxycycline (VIBRA-TABS) 100 MG tablet Take 1 tablet (100 mg total) by mouth 2 (two) times daily. 20 tablet 0  . gabapentin (NEURONTIN) 300 MG capsule Take one pill  twice per day and two at bedtime 360 capsule 1  . glimepiride (AMARYL) 2 MG tablet 1/2 pill (1 mg) once per day before the first meal of the day to control blood sugar 30 tablet 3  . glucose blood (ACCU-CHEK GUIDE) test strip Use as instructed to check blood sugars three times per day. ICD-10 E11.65 and Z79.4 100 each 11  . guaiFENesin-codeine 100-10 MG/5ML syrup Take 5 mLs by mouth 3 (three) times daily as needed for cough. 120 mL 0  . Insulin Pen Needle (TRUEPLUS PEN NEEDLES) 32G X 4 MM MISC Use as directed to inject insulin 100 each 11  . Lancets Misc. (ACCU-CHEK FASTCLIX LANCET) KIT Use to check blood sugars 3 times per day 1 kit 11  . losartan-hydrochlorothiazide (HYZAAR) 100-25 MG tablet Take 1 tablet by mouth daily. 90 tablet 3  . Respiratory Therapy Supplies (FLUTTER) DEVI Use with 4 times daily 1 each 0  . rizatriptan (MAXALT) 10 MG tablet Take 1 tablet (10 mg total) by mouth as needed for migraine. May repeat in 2 hours if needed 10 tablet 3  . Spacer/Aero-Holding Chambers (AEROCHAMBER MV) inhaler Use as instructed 1 each 0  .  traMADol (ULTRAM) 50 MG tablet Take 1 tablet (50 mg total) by mouth every 6 (six) hours as needed for moderate pain or severe pain. 120 tablet 3  . TRULICITY 3.84 YK/5.9DJ SOPN ADMINISTER 0.5 ML UNDER THE SKIN 1 TIME A WEEK 2 mL 2  . VENTOLIN HFA 108 (90 Base) MCG/ACT inhaler Inhale 2 puffs into the lungs every 4 (four) hours as needed for wheezing or shortness of breath. 18 g 3  . albuterol (PROVENTIL) (2.5 MG/3ML) 0.083% nebulizer solution Take 3 mLs (2.5 mg total) by nebulization every 6 (six) hours as needed for wheezing or shortness of breath. 75 mL 1  . Fluticasone-Salmeterol (ADVAIR) 250-50 MCG/DOSE AEPB Inhale 1 puff into the lungs 2 (two) times daily. 1 each 6  . paliperidone (INVEGA SUSTENNA) 156 MG/ML SUSY injection Inject 1 mL (156 mg total) into the muscle once for 1 dose. Due 5/1 1 mL 11  . predniSONE (DELTASONE) 10 MG tablet Take 2 pills once per day x  5 days; take after eating (Patient not taking: Reported on 03/06/2020) 10 tablet 0   No facility-administered medications prior to visit.    Review of Systems  Constitutional: Negative for appetite change, fever and malaise/fatigue.  HENT: Positive for rhinorrhea. Negative for hoarse voice, postnasal drip, sneezing, sore throat and trouble swallowing.   Respiratory: Positive for cough, sputum production, shortness of breath and wheezing. Negative for hemoptysis.   Cardiovascular: Positive for dyspnea on exertion. Negative for chest pain and PND.  Gastrointestinal: Negative for heartburn.  Musculoskeletal: Negative for myalgias.  Neurological: Positive for headaches.       Objective:   Physical Exam   Vitals:   03/06/20 1055  BP: 135/84  Pulse: 97  Resp: 20  Temp: 98.2 F (36.8 C)  TempSrc: Temporal  SpO2: 94%  Weight: 252 lb (114.3 kg)  Height: _0  (1.753 m)    Gen: Pleasant, well-nourished, in no distress,  normal affect  ENT: No lesions,  mouth clear,  oropharynx clear, no postnasal drip  Neck: No JVD, no TMG, no carotid bruits  Lungs: No use of accessory muscles, no dullness to percussion, inspiratory expiratory wheeze poor airflow scattered rhonchi  Cardiovascular: RRR, heart sounds normal, no murmur or gallops, no peripheral edema  Abdomen: soft and NT, no HSM,  BS normal  Musculoskeletal: No deformities, no cyanosis or clubbing  Neuro: alert, non focal  Skin: Warm, no lesions or rashes  No results found.     Assessment & Plan:  I personally reviewed all images and lab data in the [facility] system as well as any outside material available during this office visit and agree with the  radiology impressions.   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  The  patient's insurance requires a greater than $200 co-pay which this patient cannot afford at this time  I will send this patient to our patient assistance find with the DuoNeb 4 times daily  When the doxycycline finishes a course of Cipro was sent to his local pharmacy  Will obtain a chest x-ray  Tobacco use    . Current smoking consumption amount: 1 pack a day  . Dicsussion on advise to quit smoking and smoking impacts: Impacts on lung health  . Patient's willingness to quit: Not willing to completely quit  . Methods to quit  smoking discussed: Behavioral modification  . Medication management of smoking session drugs discussed: Has tried and failed nicotine replacement  . Resources provided:  AVS   . Setting quit date not yet established  . Follow-up arranged 3 weeks   Time spent counseling the patient: 5 minutes     Japhet was seen today for follow-up.  Diagnoses and all orders for this visit:  Type 2 diabetes mellitus with hyperglycemia, with long-term current use of insulin (HCC) -     Glucose (CBG)  COPD with chronic bronchitis (HCC) -     DG Chest 2 View; Future  Tobacco use  Other orders -     Discontinue: ipratropium-albuterol (DUONEB) 0.5-2.5 (3) MG/3ML SOLN; Take 3 mLs by nebulization every 6 (six) hours. -     ciprofloxacin (CIPRO) 500 MG tablet; Take 1 tablet (500 mg total) by mouth 2 (two) times daily. -     ipratropium-albuterol (DUONEB) 0.5-2.5 (3) MG/3ML SOLN; Take 3 mLs by nebulization every 6 (six) hours.

## 2020-03-06 NOTE — Assessment & Plan Note (Addendum)
COPD with chronic bronchitis  Recurrent acute exacerbation  Inadequate breath-hold prohibiting use of Advair dry powdered inhaler  Plan is to discontinue Advair and begin DuoNeb 4 times daily on schedule.  The patient's insurance requires a greater than $200 co-pay which this patient cannot afford at this time  I will send this patient to our patient assistance find with the DuoNeb 4 times daily  When the doxycycline finishes a course of Cipro was sent to his local pharmacy  Will obtain a chest x-ray

## 2020-03-06 NOTE — Telephone Encounter (Signed)
Pharmacy has requested changes to original Rx written today- sent for review of request

## 2020-03-06 NOTE — Progress Notes (Signed)
Reports change to phlegm consistency and color x 1 month, concerned about bronchitis   Needs glucometer/supplies, too expensive at [facility]

## 2020-03-10 ENCOUNTER — Encounter: Payer: Self-pay | Admitting: General Practice

## 2020-03-17 ENCOUNTER — Telehealth: Payer: Self-pay | Admitting: Family Medicine

## 2020-03-17 NOTE — Telephone Encounter (Signed)
Pt request refill   guaiFENesin-codeine 100-10 MG/5ML syrup  Pt states he has started coughing again, and this is the only thing that helps.  [place], [place] - [address]. Phone:  [phone]  Fax:  [phone]

## 2020-03-18 NOTE — Telephone Encounter (Signed)
Patient called in to ask Dr Joya Gaskins for a refill on the cough medicine that he had been given also say that he may need some more antibiotics also. Patient asking for a call back please Ph# [phone]

## 2020-03-21 ENCOUNTER — Other Ambulatory Visit: Payer: Self-pay | Admitting: Family Medicine

## 2020-03-21 DIAGNOSIS — G8929 Other chronic pain: Secondary | ICD-10-CM

## 2020-03-21 DIAGNOSIS — M5442 Lumbago with sciatica, left side: Secondary | ICD-10-CM

## 2020-03-26 ENCOUNTER — Telehealth: Payer: Self-pay | Admitting: *Deleted

## 2020-03-26 DIAGNOSIS — J449 Chronic obstructive pulmonary disease, unspecified: Secondary | ICD-10-CM

## 2020-03-26 NOTE — Telephone Encounter (Signed)
Copied from [place] [id number]. Topic: General - Other >> Mar 26, 2020 10:16 AM Rainey Pines A wrote: Patient would like an order placed nebulizer machine. Patient stated that he has been using a family members but he has to give it back and needs one of his own. Patient would like to speak with nurse about this today.

## 2020-03-27 ENCOUNTER — Ambulatory Visit: Payer: Medicare Other | Admitting: Pharmacist

## 2020-03-27 NOTE — Telephone Encounter (Signed)
Yes, this ok I will order one

## 2020-03-28 NOTE — Telephone Encounter (Signed)
Pt is calling checking on status  Of  Nebulizer machine and would like to know where it will be sent to also pt would like to thank dr Joya Gaskins that treatment is working

## 2020-03-28 NOTE — Telephone Encounter (Signed)
Patient aware to pick nebulizer machine on Monday.

## 2020-03-31 ENCOUNTER — Telehealth: Payer: Self-pay

## 2020-03-31 NOTE — Telephone Encounter (Signed)
Patient's spouse picked up nebulizer for patient.

## 2020-04-02 ENCOUNTER — Other Ambulatory Visit: Payer: Self-pay | Admitting: Critical Care Medicine

## 2020-04-02 ENCOUNTER — Ambulatory Visit: Payer: Medicare Other | Attending: Critical Care Medicine | Admitting: Critical Care Medicine

## 2020-04-02 ENCOUNTER — Encounter: Payer: Self-pay | Admitting: Critical Care Medicine

## 2020-04-02 ENCOUNTER — Other Ambulatory Visit: Payer: Self-pay

## 2020-04-02 DIAGNOSIS — J449 Chronic obstructive pulmonary disease, unspecified: Secondary | ICD-10-CM | POA: Diagnosis not present

## 2020-04-02 MED ORDER — IPRATROPIUM-ALBUTEROL 0.5-2.5 (3) MG/3ML IN SOLN
3.0000 mL | Freq: Four times a day (QID) | RESPIRATORY_TRACT | 0 refills | Status: DC
Start: 2020-04-02 — End: 2020-06-02

## 2020-04-02 MED ORDER — AZITHROMYCIN 250 MG PO TABS: ORAL_TABLET | ORAL | 0 refills | Status: DC

## 2020-04-02 MED FILL — IPRAT-ALBUT 0.5-3(2.5) MG/3: 0.5-2.5 (3) | 30 days supply | Qty: 360 | Fill #0

## 2020-04-02 NOTE — Progress Notes (Signed)
Refill cough medication Discuss medication: another dose of ATB -Coughs every time he goes outside.   Has not taken XR

## 2020-04-02 NOTE — Progress Notes (Signed)
Subjective:    Patient ID: Joshua [name], male    DOB: Apr 10, 1956, 64 y.o.   MRN: [id number]   Virtual Visit via Telephone Note  I connected with Joshua [name] on 04/02/20 at 11:00 AM EST by telephone and verified that I am speaking with the correct person using two identifiers.   Consent:  I discussed the limitations, risks, security and privacy concerns of performing an evaluation and management service by telephone and the availability of in person appointments. I also discussed with the patient that there may be a patient responsible charge related to this service. The patient expressed understanding and agreed to proceed.  Location of patient: Patient is at home  Location of provider: I am in my office  Persons participating in the televisit with the patient.   No one else on the call    History of Present Illness:  03/27/19 This is a video telehealth visit for a patient who has underlying COPD and was needing further follow-up upon referral from the patient's primary care provider.  The patient's been on several rounds of prednisone antibiotics with only improvement while on prednisone.  He states he is short of breath at rest and with exertion.  He has had problems with breathing for many years got worse over the past year.  He also has underlying diabetes on Trulicity and insulin long-acting.  He also states he has chronic low back pain and has difficulty ambulating this affects his breathing as well.  His weight is gone up as well which is aggravated his chronic pain.  He had a pain management doctor but has not seen that doctor since November 2019.  The patient is on tramadol from an urgent care visit he states this is helped to some degree.  He states nonsteroidals and gabapentin have not been of use in the past.  He also states that he has increased fluid retention and is questioning whether his losartan can be adjusted to accommodate reduction in fluid retention.  He  still smokes 2 pack a day of cigarettes and previous smoked as much as 4 packs a day.  He has not had a pulmonary function test.  Chest x-ray in July of this year showed no active disease.  The patient does have a Symbicort inhaler and is using this however only on an as needed basis along with albuterol which is as needed  Note when I tried to do a medication reconciliation the patient was not clear on which medicines he was taking and which ones he had stopped and he has a list of nearly 40 medicines in his E HR.  04/10/2019 This is a follow-up visit from a video visit that occurred recently.  The patient was given doxycycline and pulse prednisone for COPD exacerbation and is maintaining Symbicort daily.  The patient was referred for the COPD evaluation.  I also refill the patient's tramadol at the last visit.  He has pending pulmonary function test which have yet to be obtained.  He does state that his back is improved on the tramadol.  He also states that he has broken teeth and they will bite into his gum and mouth causing pain in the mouth.  The tramadol helps to some degree but is requesting a mouthwash.  He has not seen a dentist recently.  Patient is still smoking 2 packs a day of cigarettes.  The patient did bring all his current active medications in with him today and I did  a complete medication reconciliation see is current new medication list  04/17/2019 Video visit request re dyspnea Copd  This patient was last seen in person on December 15 and since that time the patient has had recurrent exacerbation of COPD.  He has been using his Symbicort daily.  He states he awoke with acute shortness of breath and lung congestion.  This occurred 2 days ago.  He also had increased pain in the right side.  He has been coughing up profuse thick yellow mucus.  He is using the flutter valve and the nebulizer which helps to remove some of the mucus.  His mouth pain is improved with the [facility]  mouthwash and would like a refill.  He is also out of his tramadol and is yet to achieve a chronic pain management appointment.  There is no fever.  Note he just had a negative Covid test December 18 in preparation for pulmonary function studies which have yet to be scheduled  1/18: This is a follow-up from previous visit in December that was a video visit.  The patient did receive a round of Levaquin and prednisone and he improved to some degree.  He only has albuterol for his nebulizer and is about to run out.  The patient worsened since he ran out of prednisone and the antibiotic.  He still has a lot of phlegm that is difficult to raise.  He is not using his flutter valve every day.  He continues to smoke 1 pack a day of cigarettes but previously was at 2 packs a day The patient notes pressure in the frontal and sinus area and he denies any ear pain  Note his GAD-7 score is 6 and PHQ-9 score is 9 today  He states he cannot afford the co-pay at this time of the year with his insurance.  He has run out of insulin for the past 10 days and would prefer to have his medications filled at our pharmacy  The patient also had a referral to [facility] pain center but they declined his case.  Patient's been taking 50 mg 3 times daily of the tramadol  06/28/2019 Telephone visit: Since the last visit the patient states he still having shortness of breath but his cough and mucus production as well as breathing are better since taking the course of antibiotics and prednisone.  He is now down to 1 pack a day from 3 packs a day of cigarettes.  He states he still has no energy.  He states his lower back pain is under reasonable control with tramadol.  The patient is on Trulicity and insulin glargine for his diabetes but has run out of testing strips.  He also wishes a refill on his Maxalt for his migraines.  The patient maintains his inhaled medications without changes.  09/18/2019 Patient seen in return follow-up  and his shortness of breath is much improved.  He is coughing less.  He is smoking less.  He has no new complaints other than chronic back pain  11/20/2019 This visit is a video telehealth visit for this patient who has chronic lung disease, hypertension, peripheral vascular disease, type 2 diabetes, chronic low back pain.  The patient states last several weeks and been difficult with change in weather being very hot and humid.  The patient notes he ha s had chest congestion cough and more shortness of breath.  Patient's not been using his Advair on a regular basis.  He is about out of tramadol  will need refills soon.  He is requesting a refill on his Maxalt.  He also would like mouthwash because his throat is irritated again.  He is yet to receive the Covid vaccine and is interested in this.  Patient states he is no longer on Basaglar insulin he is on Trulicity and Amaryl  The patient needs more glucose testing supplies  02/06/2020 telehealth visit for copd The patient can only connect by phone today.  He states he is having increased wheezing productive cough.  He is still smoking 1 to 2 cigarettes a day is trying to reduce this.  He would like a flu vaccine.  He did receive his Covid vaccine series.  He has some headaches and the mucus is yellow.  See a COPD assessment below  03/06/2020 PCP patient of dr fulp   Here for Copd f/u Patient states his shortness of breath continues.  He was just seen by primary care and given a course of doxycycline he just started on the ninth.  He had yellow-green mucus notes clear but thick foamy.  He is still short of breath minimal exertion.  He states prednisone causes his sugars to go up and he is 275 on arrival today with his glucose he has not been taking his Amaryl regularly.  He states prednisone causes side effects.  He still smoking a pack a day of cigarettes.  See shortness of breath assessment below  The patient states he is unable to take a deep  breath with the Advair inhaler at this time    04/02/2020 Copd f/u and needs new PCP former [facility].  HTN, Copd, Migraine, PAD, chronic pain, T2DM, DJD, GAD, Schizoaffective, tobacco, spinal stenosis.   On Tramadol chronically , has pain contract  This is a telephone visit for this patient with underlying COPD chronic pain here for COPD follow-up.  He states he now has a home nebulizer doing well on the DuoNeb 4 times daily he is still however coughing up some yellow to white mucus with increased wheeze  His chronic pain is being well managed he has no other complaints  COPD He complains of cough, shortness of breath, sputum production and wheezing. There is no chest tightness, difficulty breathing, frequent throat clearing, hemoptysis or hoarse voice. This is a chronic problem. The current episode started more than 1 year ago. The problem occurs constantly. The problem has been unchanged. The cough is productive of sputum and productive of purulent sputum. Associated symptoms include dyspnea on exertion, headaches, nasal congestion, orthopnea and rhinorrhea. Pertinent negatives include no appetite change, chest pain, ear congestion, fever, heartburn, malaise/fatigue, myalgias, PND, postnasal drip, sneezing, sore throat, sweats or trouble swallowing. His symptoms are aggravated by any activity, change in weather, climbing stairs, lying down, exposure to fumes, strenuous activity, emotional stress and minimal activity. His symptoms are alleviated by beta-agonist, oral steroids and steroid inhaler. Risk factors for lung disease include smoking/tobacco exposure. His past medical history is significant for COPD.   Past Medical History:  Diagnosis Date  . Back pain   . Bipolar disorder ([place])   . Cannabis use disorder, moderate, dependence ([place])   . Chronic pain   . Diabetes mellitus without complication ([place])   . Generalized anxiety disorder   . Headache   . Hypertension   . Schizoaffective  disorder ([place])      Family History  Problem Relation Age of Onset  . Diabetes Mother   . Cancer Mother  unsure what type  . Heart disease Mother   . Hypertension Mother   . Dementia Father   . Diabetes Brother   . Dementia Maternal Grandfather   . Cancer Paternal Grandmother 39       breast  . Mental illness Neg Hx      Social History   Socioeconomic History  . Marital status: Married    Spouse name: Not on file  . Number of children: Not on file  . Years of education: Not on file  . Highest education level: Not on file  Occupational History  . Not on file  Tobacco Use  . Smoking status: Current Every Day Smoker    Packs/day: 2.00    Types: Cigarettes  . Smokeless tobacco: Never Used  Vaping Use  . Vaping Use: Never used  Substance and Sexual Activity  . Alcohol use: Not Currently  . Drug use: Yes    Types: Marijuana    Comment: "have changed to hemp now" 09/17/17  . Sexual activity: Not on file  Other Topics Concern  . Not on file  Social History Narrative  . Not on file   Social Determinants of Health   Financial Resource Strain:   . Difficulty of Paying Living Expenses: Not on file  Food Insecurity:   . Worried About Charity fundraiser in the Last Year: Not on file  . Ran Out of Food in the Last Year: Not on file  Transportation Needs:   . Lack of Transportation (Medical): Not on file  . Lack of Transportation (Non-Medical): Not on file  Physical Activity:   . Days of Exercise per Week: Not on file  . Minutes of Exercise per Session: Not on file  Stress:   . Feeling of Stress : Not on file  Social Connections:   . Frequency of Communication with Friends and Family: Not on file  . Frequency of Social Gatherings with Friends and Family: Not on file  . Attends Religious Services: Not on file  . Active Member of Clubs or Organizations: Not on file  . Attends Archivist Meetings: Not on file  . Marital Status: Not on file  Intimate  Partner Violence:   . Fear of Current or Ex-Partner: Not on file  . Emotionally Abused: Not on file  . Physically Abused: Not on file  . Sexually Abused: Not on file     No Known Allergies   Outpatient Medications Prior to Visit  Medication Sig Dispense Refill  . Accu-Chek FastClix Lancets MISC Use to check blood sugars three times per day 100 each 11  . blood glucose meter kit and supplies KIT Dispense based on patient and insurance preference. Use up to four times daily as directed. ICD-10 E11.65  Z79.4 1 each 0  . blood glucose meter kit and supplies Dispense based on patient and insurance preference. Use up to four times daily as directed. (FOR ICD-10 E10.9, E11.9). 1 each 0  . Blood Glucose Monitoring Suppl (ACCU-CHEK GUIDE ME) w/Device KIT Use to check blood sugars up to 3 times per day. ICD-10 E11.65, Z79.4 1 kit 0  . diclofenac (VOLTAREN) 50 MG EC tablet TAKE 1 TABLET( 50 MG TOTAL) BY MOUTH 2 TIMES DAILY AS NEEDED FOR MODERATE PAIN, EAT BEFORE TAKING MEDICATION 60 tablet 1  . divalproex (DEPAKOTE ER) 500 MG 24 hr tablet Take 3 tablets (1,500 mg total) by mouth at bedtime. 90 tablet 11  . gabapentin (NEURONTIN) 300 MG capsule Take one  pill twice per day and two at bedtime 360 capsule 1  . glimepiride (AMARYL) 2 MG tablet 1/2 pill (1 mg) once per day before the first meal of the day to control blood sugar 30 tablet 3  . glucose blood (ACCU-CHEK GUIDE) test strip Use as instructed to check blood sugars three times per day. ICD-10 E11.65 and Z79.4 100 each 11  . Insulin Pen Needle (TRUEPLUS PEN NEEDLES) 32G X 4 MM MISC Use as directed to inject insulin 100 each 11  . Lancets Misc. (ACCU-CHEK FASTCLIX LANCET) KIT Use to check blood sugars 3 times per day 1 kit 11  . losartan-hydrochlorothiazide (HYZAAR) 100-25 MG tablet Take 1 tablet by mouth daily. 90 tablet 3  . Respiratory Therapy Supplies (FLUTTER) DEVI Use with 4 times daily 1 each 0  . rizatriptan (MAXALT) 10 MG tablet Take 1 tablet  (10 mg total) by mouth as needed for migraine. May repeat in 2 hours if needed 10 tablet 3  . Spacer/Aero-Holding Chambers (AEROCHAMBER MV) inhaler Use as instructed 1 each 0  . traMADol (ULTRAM) 50 MG tablet Take 1 tablet (50 mg total) by mouth every 6 (six) hours as needed for moderate pain or severe pain. 120 tablet 3  . TRULICITY 6.06 TK/1.6WF SOPN ADMINISTER 0.5 ML UNDER THE SKIN 1 TIME A WEEK 2 mL 2  . VENTOLIN HFA 108 (90 Base) MCG/ACT inhaler Inhale 2 puffs into the lungs every 4 (four) hours as needed for wheezing or shortness of breath. 18 g 3  . ipratropium-albuterol (DUONEB) 0.5-2.5 (3) MG/3ML SOLN Take 3 mLs by nebulization every 6 (six) hours. 360 mL 0  . guaiFENesin-codeine 100-10 MG/5ML syrup Take 5 mLs by mouth 3 (three) times daily as needed for cough. (Patient not taking: Reported on 04/02/2020) 120 mL 0  . paliperidone (INVEGA SUSTENNA) 156 MG/ML SUSY injection Inject 1 mL (156 mg total) into the muscle once for 1 dose. Due 5/1 1 mL 11  . ciprofloxacin (CIPRO) 500 MG tablet Take 1 tablet (500 mg total) by mouth 2 (two) times daily. (Patient not taking: Reported on 04/02/2020) 10 tablet 0  . doxycycline (VIBRA-TABS) 100 MG tablet Take 1 tablet (100 mg total) by mouth 2 (two) times daily. (Patient not taking: Reported on 04/02/2020) 20 tablet 0   No facility-administered medications prior to visit.    Review of Systems  Constitutional: Negative for appetite change, fever and malaise/fatigue.  HENT: Positive for rhinorrhea. Negative for hoarse voice, postnasal drip, sneezing, sore throat and trouble swallowing.   Respiratory: Positive for cough, sputum production, shortness of breath and wheezing. Negative for hemoptysis.   Cardiovascular: Positive for dyspnea on exertion. Negative for chest pain and PND.  Gastrointestinal: Negative for heartburn.  Musculoskeletal: Negative for myalgias.  Neurological: Positive for headaches.       Objective:   Physical Exam   There were no  vitals filed for this visit.  Gen: Pleasant, well-nourished, in no distress,  normal affect  ENT: No lesions,  mouth clear,  oropharynx clear, no postnasal drip  Neck: No JVD, no TMG, no carotid bruits  Lungs: No use of accessory muscles, no dullness to percussion, inspiratory expiratory wheeze poor airflow scattered rhonchi  Cardiovascular: RRR, heart sounds normal, no murmur or gallops, no peripheral edema  Abdomen: soft and NT, no HSM,  BS normal  Musculoskeletal: No deformities, no cyanosis or clubbing  Neuro: alert, non focal  Skin: Warm, no lesions or rashes  No results found.     Assessment &  Plan:  I personally reviewed all images and lab data in the [facility] system as well as any outside material available during this office visit and agree with the  radiology impressions.   COPD with chronic bronchitis ([place]) COPD with chronic bronchitis we will refill the DuoNeb continue 4 times daily under patient assistance and will give a 5-day course of azithromycin   Finbar was seen today for cough.  Diagnoses and all orders for this visit:  COPD with chronic bronchitis ([place])  Other orders -     ipratropium-albuterol (DUONEB) 0.5-2.5 (3) MG/3ML SOLN; Take 3 mLs by nebulization every 6 (six) hours. -     azithromycin (ZITHROMAX) 250 MG tablet; Take two once then one daily until gone      Follow Up Instructions: Patient knows a return visit will be made in the next few weeks in person   I discussed the assessment and treatment plan with the patient. The patient was provided an opportunity to ask questions and all were answered. The patient agreed with the plan and demonstrated an understanding of the instructions.   The patient was advised to call back or seek an in-person evaluation if the symptoms worsen or if the condition fails to improve as anticipated.  I provided 20 minutes of non-face-to-face time during this encounter  including  median intraservice time , review of  notes, labs, imaging, medications  and explaining diagnosis and management to the patient .    Asencion Noble, MD

## 2020-04-02 NOTE — Assessment & Plan Note (Signed)
COPD with chronic bronchitis we will refill the DuoNeb continue 4 times daily under patient assistance and will give a 5-day course of azithromycin

## 2020-05-06 ENCOUNTER — Other Ambulatory Visit: Payer: Self-pay

## 2020-05-06 ENCOUNTER — Ambulatory Visit: Payer: Medicare Other | Attending: Family Medicine | Admitting: Family Medicine

## 2020-05-06 DIAGNOSIS — G8929 Other chronic pain: Secondary | ICD-10-CM

## 2020-05-06 DIAGNOSIS — G43709 Chronic migraine without aura, not intractable, without status migrainosus: Secondary | ICD-10-CM | POA: Diagnosis not present

## 2020-05-06 DIAGNOSIS — M5442 Lumbago with sciatica, left side: Secondary | ICD-10-CM | POA: Diagnosis not present

## 2020-05-06 MED ORDER — DICLOFENAC SODIUM 50 MG PO TBEC: DELAYED_RELEASE_TABLET | ORAL | 1 refills | Status: DC

## 2020-05-06 MED ORDER — RIZATRIPTAN BENZOATE 10 MG PO TABS: 10.0000 mg | ORAL_TABLET | ORAL | 3 refills | Status: DC | PRN

## 2020-05-06 NOTE — Progress Notes (Signed)
Virtual Visit via Telephone Note  I connected with Joshua [name], on 05/06/2020 at 4:16 PM by telephone due to the COVID-19 pandemic and verified that I am speaking with the correct person using two identifiers.   Consent: I discussed the limitations, risks, security and privacy concerns of performing an evaluation and management service by telephone and the availability of in person appointments. I also discussed with the patient that there may be a patient responsible charge related to this service. The patient expressed understanding and agreed to proceed.   Location of Patient: Home  Location of Provider: Home   Persons participating in Telemedicine visit: Tarris W Gadsden Alicia Farrington-CMA Dr.      History of Present Illness: 65 year old male with a history of HTN, Type 2 DM, Bipolar disorder, chronic low back pain seen for an acute visit. He has a visit with Dr Wright last month.  He needs Diclofenac refilled which he takes for his low back pain. Also requests refill of Maxalt for his migraine headaches. States he has declined surgical option for management of his back pain.  Past Medical History:  Diagnosis Date  . Back pain   . Bipolar disorder ([facility])   . Cannabis use disorder, moderate, dependence ([facility])   . Chronic pain   . Diabetes mellitus without complication ([facility])   . Generalized anxiety disorder   . Headache   . Hypertension   . Schizoaffective disorder ([facility])    No Known Allergies  Current Outpatient Medications on File Prior to Visit  Medication Sig Dispense Refill  . Accu-Chek FastClix Lancets MISC Use to check blood sugars three times per day 100 each 11  . azithromycin (ZITHROMAX) 250 MG tablet Take two once then one daily until gone 6 tablet 0  . blood glucose meter kit and supplies KIT Dispense based on patient and insurance preference. Use up to four times daily as directed. ICD-10 E11.65  Z79.4 1 each 0  . blood glucose meter kit and  supplies Dispense based on patient and insurance preference. Use up to four times daily as directed. (FOR ICD-10 E10.9, E11.9). 1 each 0  . Blood Glucose Monitoring Suppl (ACCU-CHEK GUIDE ME) w/Device KIT Use to check blood sugars up to 3 times per day. ICD-10 E11.65, Z79.4 1 kit 0  . diclofenac (VOLTAREN) 50 MG EC tablet TAKE 1 TABLET( 50 MG TOTAL) BY MOUTH 2 TIMES DAILY AS NEEDED FOR MODERATE PAIN, EAT BEFORE TAKING MEDICATION 60 tablet 1  . divalproex (DEPAKOTE ER) 500 MG 24 hr tablet Take 3 tablets (1,500 mg total) by mouth at bedtime. 90 tablet 11  . gabapentin (NEURONTIN) 300 MG capsule Take one pill twice per day and two at bedtime 360 capsule 1  . glimepiride (AMARYL) 2 MG tablet 1/2 pill (1 mg) once per day before the first meal of the day to control blood sugar 30 tablet 3  . glucose blood (ACCU-CHEK GUIDE) test strip Use as instructed to check blood sugars three times per day. ICD-10 E11.65 and Z79.4 100 each 11  . Insulin Pen Needle (TRUEPLUS PEN NEEDLES) 32G X 4 MM MISC Use as directed to inject insulin 100 each 11  . ipratropium-albuterol (DUONEB) 0.5-2.5 (3) MG/3ML SOLN Take 3 mLs by nebulization every 6 (six) hours. 360 mL 0  . Lancets Misc. (ACCU-CHEK FASTCLIX LANCET) KIT Use to check blood sugars 3 times per day 1 kit 11  . losartan-hydrochlorothiazide (HYZAAR) 100-25 MG tablet Take 1 tablet by mouth daily. 90 tablet   3  . Respiratory Therapy Supplies (FLUTTER) DEVI Use with 4 times daily 1 each 0  . rizatriptan (MAXALT) 10 MG tablet Take 1 tablet (10 mg total) by mouth as needed for migraine. May repeat in 2 hours if needed 10 tablet 3  . Spacer/Aero-Holding Chambers (AEROCHAMBER MV) inhaler Use as instructed 1 each 0  . traMADol (ULTRAM) 50 MG tablet Take 1 tablet (50 mg total) by mouth every 6 (six) hours as needed for moderate pain or severe pain. 120 tablet 3  . TRULICITY 4.15 AX/0.9MM SOPN ADMINISTER 0.5 ML UNDER THE SKIN 1 TIME A WEEK 2 mL 2  . VENTOLIN HFA 108 (90 Base)  MCG/ACT inhaler Inhale 2 puffs into the lungs every 4 (four) hours as needed for wheezing or shortness of breath. 18 g 3  . guaiFENesin-codeine 100-10 MG/5ML syrup Take 5 mLs by mouth 3 (three) times daily as needed for cough. (Patient not taking: No sig reported) 120 mL 0  . paliperidone (INVEGA SUSTENNA) 156 MG/ML SUSY injection Inject 1 mL (156 mg total) into the muscle once for 1 dose. Due 5/1 1 mL 11   No current facility-administered medications on file prior to visit.    Observations/Objective: Awake, alert, oriented x3 Not in acute distress  Assessment and Plan: 1. Chronic migraine without aura without status migrainosus, not intractable Stable - rizatriptan (MAXALT) 10 MG tablet; Take 1 tablet (10 mg total) by mouth as needed for migraine. May repeat in 2 hours if needed  Dispense: 10 tablet; Refill: 3  2. Chronic low back pain with left-sided sciatica, unspecified back pain laterality Uncontrolled Discussed the use of OTC Lidoderm patches in addition - diclofenac (VOLTAREN) 50 MG EC tablet; TAKE 1 TABLET( 50 MG TOTAL) BY MOUTH 2 TIMES DAILY AS NEEDED FOR MODERATE PAIN, EAT BEFORE TAKING MEDICATION  Dispense: 60 tablet; Refill: 1   Follow Up Instructions: Keep previously scheduled appointment   I discussed the assessment and treatment plan with the patient. The patient was provided an opportunity to ask questions and all were answered. The patient agreed with the plan and demonstrated an understanding of the instructions.   The patient was advised to call back or seek an in-person evaluation if the symptoms worsen or if the condition fails to improve as anticipated.     I provided 11 minutes total of non-face-to-face time during this encounter including median intraservice time, reviewing previous notes, investigations, ordering medications, medical decision making, coordinating care and patient verbalized understanding at the end of the visit.     Charlott Rakes, MD,  FAAFP. [facility] and [place], [place]   05/06/2020, 4:16 PM

## 2020-05-06 NOTE — Progress Notes (Signed)
Needs refill on medication for headaches and diclofenac.

## 2020-05-14 ENCOUNTER — Ambulatory Visit: Payer: Medicare Other | Admitting: Neurology

## 2020-06-01 NOTE — Progress Notes (Signed)
Subjective:    Patient ID: Joshua [name], male    DOB: [birth year], 65 y.o.   MRN: [id number]   History of Present Illness:  03/27/19 This is a video telehealth visit for a patient who has underlying COPD and was needing further follow-up upon referral from the patient's primary care provider.  The patient's been on several rounds of prednisone antibiotics with only improvement while on prednisone.  He states he is short of breath at rest and with exertion.  He has had problems with breathing for many years got worse over the past year.  He also has underlying diabetes on Trulicity and insulin long-acting.  He also states he has chronic low back pain and has difficulty ambulating this affects his breathing as well.  His weight is gone up as well which is aggravated his chronic pain.  He had a pain management doctor but has not seen that doctor since November 2019.  The patient is on tramadol from an urgent care visit he states this is helped to some degree.  He states nonsteroidals and gabapentin have not been of use in the past.  He also states that he has increased fluid retention and is questioning whether his losartan can be adjusted to accommodate reduction in fluid retention.  He still smokes 2 pack a day of cigarettes and previous smoked as much as 4 packs a day.  He has not had a pulmonary function test.  Chest x-ray in July of this year showed no active disease.  The patient does have a Symbicort inhaler and is using this however only on an as needed basis along with albuterol which is as needed  Note when I tried to do a medication reconciliation the patient was not clear on which medicines he was taking and which ones he had stopped and he has a list of nearly 40 medicines in his E HR.  04/10/2019 This is a follow-up visit from a video visit that occurred recently.  The patient was given doxycycline and pulse prednisone for COPD exacerbation and is maintaining Symbicort daily.  The  patient was referred for the COPD evaluation.  I also refill the patient's tramadol at the last visit.  He has pending pulmonary function test which have yet to be obtained.  He does state that his back is improved on the tramadol.  He also states that he has broken teeth and they will bite into his gum and mouth causing pain in the mouth.  The tramadol helps to some degree but is requesting a mouthwash.  He has not seen a dentist recently.  Patient is still smoking 2 packs a day of cigarettes.  The patient did bring all his current active medications in with him today and I did a complete medication reconciliation see is current new medication list  04/17/2019 Video visit request re dyspnea Copd  This patient was last seen in person on December 15 and since that time the patient has had recurrent exacerbation of COPD.  He has been using his Symbicort daily.  He states he awoke with acute shortness of breath and lung congestion.  This occurred 2 days ago.  He also had increased pain in the right side.  He has been coughing up profuse thick yellow mucus.  He is using the flutter valve and the nebulizer which helps to remove some of the mucus.  His mouth pain is improved with the [place]ukes mouthwash and would like a refill.  He is also  out of his tramadol and is yet to achieve a chronic pain management appointment.  There is no fever.  Note he just had a negative Covid test December 18 in preparation for pulmonary function studies which have yet to be scheduled  1/18: This is a follow-up from previous visit in December that was a video visit.  The patient did receive a round of Levaquin and prednisone and he improved to some degree.  He only has albuterol for his nebulizer and is about to run out.  The patient worsened since he ran out of prednisone and the antibiotic.  He still has a lot of phlegm that is difficult to raise.  He is not using his flutter valve every day.  He continues to smoke 1 pack a day of  cigarettes but previously was at 2 packs a day The patient notes pressure in the frontal and sinus area and he denies any ear pain  Note his GAD-7 score is 6 and PHQ-9 score is 9 today  He states he cannot afford the co-pay at this time of the year with his insurance.  He has run out of insulin for the past 10 days and would prefer to have his medications filled at our pharmacy  The patient also had a referral to [facility] pain center but they declined his case.  Patient's been taking 50 mg 3 times daily of the tramadol  06/28/2019 Telephone visit: Since the last visit the patient states he still having shortness of breath but his cough and mucus production as well as breathing are better since taking the course of antibiotics and prednisone.  He is now down to 1 pack a day from 3 packs a day of cigarettes.  He states he still has no energy.  He states his lower back pain is under reasonable control with tramadol.  The patient is on Trulicity and insulin glargine for his diabetes but has run out of testing strips.  He also wishes a refill on his Maxalt for his migraines.  The patient maintains his inhaled medications without changes.  09/18/2019 Patient seen in return follow-up and his shortness of breath is much improved.  He is coughing less.  He is smoking less.  He has no new complaints other than chronic back pain  11/20/2019 This visit is a video telehealth visit for this patient who has chronic lung disease, hypertension, peripheral vascular disease, type 2 diabetes, chronic low back pain.  The patient states last several weeks and been difficult with change in weather being very hot and humid.  The patient notes he ha s had chest congestion cough and more shortness of breath.  Patient's not been using his Advair on a regular basis.  He is about out of tramadol will need refills soon.  He is requesting a refill on his Maxalt.  He also would like mouthwash because his throat is irritated again.   He is yet to receive the Covid vaccine and is interested in this.  Patient states he is no longer on Basaglar insulin he is on Trulicity and Amaryl  The patient needs more glucose testing supplies  02/06/2020 telehealth visit for copd The patient can only connect by phone today.  He states he is having increased wheezing productive cough.  He is still smoking 1 to 2 cigarettes a day is trying to reduce this.  He would like a flu vaccine.  He did receive his Covid vaccine series.  He has some headaches and the  mucus is yellow.  See a COPD assessment below  03/06/2020 PCP patient of dr fulp   Here for Copd f/u Patient states his shortness of breath continues.  He was just seen by primary care and given a course of doxycycline he just started on the ninth.  He had yellow-green mucus notes clear but thick foamy.  He is still short of breath minimal exertion.  He states prednisone causes his sugars to go up and he is 275 on arrival today with his glucose he has not been taking his Amaryl regularly.  He states prednisone causes side effects.  He still smoking a pack a day of cigarettes.  See shortness of breath assessment below  The patient states he is unable to take a deep breath with the Advair inhaler at this time    04/02/2020 Copd f/u and needs new PCP former [facility].  HTN, Copd, Migraine, PAD, chronic pain, T2DM, DJD, GAD, Schizoaffective, tobacco, spinal stenosis.   On Tramadol chronically , has pain contract  This is a telephone visit for this patient with underlying COPD chronic pain here for COPD follow-up.  He states he now has a home nebulizer doing well on the DuoNeb 4 times daily he is still however coughing up some yellow to white mucus with increased wheeze  His chronic pain is being well managed he has no other complaints  06/02/2020 Patient seen in return follow-up states his breathing is at baseline feels somewhat weak has not been taking in fluids much on arrival blood  pressure is 81/60 pulse 120 blood glucose 403  Patient has been skipping some of his doses of medications for his diabetes  No other complaints  COPD He complains of cough, shortness of breath, sputum production and wheezing. There is no chest tightness, difficulty breathing, frequent throat clearing, hemoptysis or hoarse voice. This is a chronic problem. The current episode started more than 1 year ago. The problem occurs constantly. The problem has been unchanged. The cough is productive of sputum and productive of purulent sputum. Associated symptoms include dyspnea on exertion, headaches, nasal congestion, orthopnea and rhinorrhea. Pertinent negatives include no appetite change, chest pain, ear congestion, fever, heartburn, malaise/fatigue, myalgias, PND, postnasal drip, sneezing, sore throat, sweats or trouble swallowing. His symptoms are aggravated by any activity, change in weather, climbing stairs, lying down, exposure to fumes, strenuous activity, emotional stress and minimal activity. His symptoms are alleviated by beta-agonist, oral steroids and steroid inhaler. Risk factors for lung disease include smoking/tobacco exposure. His past medical history is significant for COPD.   Past Medical History:  Diagnosis Date  . Back pain   . Bipolar disorder ([place])   . Cannabis use disorder, moderate, dependence ([place])   . Chronic pain   . Diabetes mellitus without complication ([place])   . Generalized anxiety disorder   . Headache   . Hypertension   . Schizoaffective disorder ([place])      Family History  Problem Relation Age of Onset  . Diabetes Mother   . Cancer Mother        unsure what type  . Heart disease Mother   . Hypertension Mother   . Dementia Father   . Diabetes Brother   . Dementia Maternal Grandfather   . Cancer Paternal Grandmother 65       breast  . Mental illness Neg Hx      Social History   Socioeconomic History  . Marital status: Married    Spouse name: Not on file   .  Number of children: Not on file  . Years of education: Not on file  . Highest education level: Not on file  Occupational History  . Not on file  Tobacco Use  . Smoking status: Current Every Day Smoker    Packs/day: 2.00    Types: Cigarettes  . Smokeless tobacco: Never Used  Vaping Use  . Vaping Use: Never used  Substance and Sexual Activity  . Alcohol use: Not Currently  . Drug use: Yes    Types: Marijuana    Comment: "have changed to hemp now" 09/17/17  . Sexual activity: Not on file  Other Topics Concern  . Not on file  Social History Narrative  . Not on file   Social Determinants of Health   Financial Resource Strain: Not on file  Food Insecurity: Not on file  Transportation Needs: Not on file  Physical Activity: Not on file  Stress: Not on file  Social Connections: Not on file  Intimate Partner Violence: Not on file     No Known Allergies   Outpatient Medications Prior to Visit  Medication Sig Dispense Refill  . Accu-Chek FastClix Lancets MISC Use to check blood sugars three times per day 100 each 11  . blood glucose meter kit and supplies KIT Dispense based on patient and insurance preference. Use up to four times daily as directed. ICD-10 E11.65  Z79.4 1 each 0  . blood glucose meter kit and supplies Dispense based on patient and insurance preference. Use up to four times daily as directed. (FOR ICD-10 E10.9, E11.9). 1 each 0  . Blood Glucose Monitoring Suppl (ACCU-CHEK GUIDE ME) w/Device KIT Use to check blood sugars up to 3 times per day. ICD-10 E11.65, Z79.4 1 kit 0  . diclofenac (VOLTAREN) 50 MG EC tablet TAKE 1 TABLET( 50 MG TOTAL) BY MOUTH 2 TIMES DAILY AS NEEDED FOR MODERATE PAIN, EAT BEFORE TAKING MEDICATION 60 tablet 1  . divalproex (DEPAKOTE ER) 500 MG 24 hr tablet Take 3 tablets (1,500 mg total) by mouth at bedtime. 90 tablet 11  . gabapentin (NEURONTIN) 300 MG capsule Take one pill twice per day and two at bedtime 360 capsule 1  . glucose blood  (ACCU-CHEK GUIDE) test strip Use as instructed to check blood sugars three times per day. ICD-10 E11.65 and Z79.4 100 each 11  . Insulin Pen Needle (TRUEPLUS PEN NEEDLES) 32G X 4 MM MISC Use as directed to inject insulin 100 each 11  . Lancets Misc. (ACCU-CHEK FASTCLIX LANCET) KIT Use to check blood sugars 3 times per day 1 kit 11  . Respiratory Therapy Supplies (FLUTTER) DEVI Use with 4 times daily 1 each 0  . rizatriptan (MAXALT) 10 MG tablet Take 1 tablet (10 mg total) by mouth as needed for migraine. May repeat in 2 hours if needed 10 tablet 3  . Spacer/Aero-Holding Chambers (AEROCHAMBER MV) inhaler Use as instructed 1 each 0  . traMADol (ULTRAM) 50 MG tablet Take 1 tablet (50 mg total) by mouth every 6 (six) hours as needed for moderate pain or severe pain. 120 tablet 3  . TRULICITY 7.41 SE/3.9RV SOPN ADMINISTER 0.5 ML UNDER THE SKIN 1 TIME A WEEK 2 mL 2  . VENTOLIN HFA 108 (90 Base) MCG/ACT inhaler Inhale 2 puffs into the lungs every 4 (four) hours as needed for wheezing or shortness of breath. 18 g 3  . glimepiride (AMARYL) 2 MG tablet 1/2 pill (1 mg) once per day before the first meal of the day to control  blood sugar 30 tablet 3  . losartan-hydrochlorothiazide (HYZAAR) 100-25 MG tablet Take 1 tablet by mouth daily. 90 tablet 3  . guaiFENesin-codeine 100-10 MG/5ML syrup Take 5 mLs by mouth 3 (three) times daily as needed for cough. (Patient not taking: No sig reported) 120 mL 0  . paliperidone (INVEGA SUSTENNA) 156 MG/ML SUSY injection Inject 1 mL (156 mg total) into the muscle once for 1 dose. Due 5/1 1 mL 11  . azithromycin (ZITHROMAX) 250 MG tablet Take two once then one daily until gone (Patient not taking: Reported on 06/02/2020) 6 tablet 0  . ipratropium-albuterol (DUONEB) 0.5-2.5 (3) MG/3ML SOLN Take 3 mLs by nebulization every 6 (six) hours. 360 mL 0   No facility-administered medications prior to visit.    Review of Systems  Constitutional: Negative for appetite change, fever and  malaise/fatigue.  HENT: Positive for rhinorrhea. Negative for hoarse voice, postnasal drip, sneezing, sore throat and trouble swallowing.   Respiratory: Positive for cough, sputum production, shortness of breath and wheezing. Negative for hemoptysis.   Cardiovascular: Positive for dyspnea on exertion. Negative for chest pain and PND.  Gastrointestinal: Negative for heartburn.  Musculoskeletal: Negative for myalgias.  Neurological: Positive for headaches.       Objective:   Physical Exam   Vitals:   06/02/20 1421  BP: (!) 84/61  Pulse: (!) 123  SpO2: 93%  Height: _0  (1.753 m)    Gen: Pleasant, well-nourished, in no distress,  normal affect  ENT: No lesions,  mouth clear,  oropharynx clear, no postnasal drip  Neck: No JVD, no TMG, no carotid bruits  Lungs: No use of accessory muscles, no dullness to percussion, i distant breath sounds   cardiovascular: RRR, heart sounds normal, no murmur or gallops, no peripheral edema  Abdomen: soft and NT, no HSM,  BS normal  Musculoskeletal: No deformities, no cyanosis or clubbing  Neuro: alert, non focal  Skin: Warm, no lesions or rashes  foot exam normal    8 units of NovoLog given of insulin and 45 minutes later blood sugar checked at 370 No results found.     Assessment & Plan:  I personally reviewed all images and lab data in the [facility] system as well as any outside material available during this office visit and agree with the  radiology impressions.   COPD with chronic bronchitis ([place]) COPD at baseline no change in inhalers refills on nebulizer medicine given  Type 2 diabetes mellitus with hyperglycemia ([place]) Poorly controlled type 2 diabetes plan is to begin with insulin glargine with an increase of dose to 20 units daily patient will hold losartan HCT given volume depletion Continue Trulicity weekly Discontinue Amaryl Return in short-term follow-up with clinical pharmacist   Salvator was seen today for  diabetes.  Diagnoses and all orders for this visit:  Type 2 diabetes mellitus with hyperglycemia, with long-term current use of insulin (HCC) -     POCT glucose (manual entry) -     POCT glycosylated hemoglobin (Hb A1C) -     insulin aspart (novoLOG) injection 8 Units -     POCT glucose (manual entry)  COPD with chronic bronchitis ([facility]CC)  Colon cancer screening -     Cologuard  Other orders -     ipratropium-albuterol (DUONEB) 0.5-2.5 (3) MG/3ML SOLN; Take 3 mLs by nebulization every 6 (six) hours. -     Discontinue: empagliflozin (JARDIANCE) 25 MG TABS tablet; Take 1 tablet (25 mg total) by mouth daily before breakfast. -  Insulin Glargine (BASAGLAR KWIKPEN) 100 UNIT/ML; Inject 20 Units into the skin daily. -     losartan-hydrochlorothiazide (HYZAAR) 100-25 MG tablet; HOLD  We held off drawing any labs at this visit  Cologuard test was ordered to be mailed to the patient's home  Home COVID booster vaccine was ordered  Recommended the patient get an eye exam  Focus on hydration

## 2020-06-02 ENCOUNTER — Encounter: Payer: Self-pay | Admitting: Critical Care Medicine

## 2020-06-02 ENCOUNTER — Ambulatory Visit: Payer: Medicare Other | Attending: Critical Care Medicine | Admitting: Critical Care Medicine

## 2020-06-02 ENCOUNTER — Other Ambulatory Visit: Payer: Self-pay | Admitting: Critical Care Medicine

## 2020-06-02 ENCOUNTER — Other Ambulatory Visit: Payer: Self-pay

## 2020-06-02 VITALS — BP 84/61 | HR 123 | Ht 69.0 in

## 2020-06-02 DIAGNOSIS — F411 Generalized anxiety disorder: Secondary | ICD-10-CM | POA: Insufficient documentation

## 2020-06-02 DIAGNOSIS — J449 Chronic obstructive pulmonary disease, unspecified: Secondary | ICD-10-CM

## 2020-06-02 DIAGNOSIS — G43909 Migraine, unspecified, not intractable, without status migrainosus: Secondary | ICD-10-CM | POA: Insufficient documentation

## 2020-06-02 DIAGNOSIS — M549 Dorsalgia, unspecified: Secondary | ICD-10-CM | POA: Insufficient documentation

## 2020-06-02 DIAGNOSIS — R609 Edema, unspecified: Secondary | ICD-10-CM | POA: Diagnosis not present

## 2020-06-02 DIAGNOSIS — Z794 Long term (current) use of insulin: Secondary | ICD-10-CM

## 2020-06-02 DIAGNOSIS — Z760 Encounter for issue of repeat prescription: Secondary | ICD-10-CM | POA: Diagnosis not present

## 2020-06-02 DIAGNOSIS — E119 Type 2 diabetes mellitus without complications: Secondary | ICD-10-CM | POA: Diagnosis not present

## 2020-06-02 DIAGNOSIS — Z7984 Long term (current) use of oral hypoglycemic drugs: Secondary | ICD-10-CM | POA: Diagnosis not present

## 2020-06-02 DIAGNOSIS — R262 Difficulty in walking, not elsewhere classified: Secondary | ICD-10-CM | POA: Diagnosis not present

## 2020-06-02 DIAGNOSIS — M1990 Unspecified osteoarthritis, unspecified site: Secondary | ICD-10-CM | POA: Diagnosis not present

## 2020-06-02 DIAGNOSIS — F259 Schizoaffective disorder, unspecified: Secondary | ICD-10-CM | POA: Insufficient documentation

## 2020-06-02 DIAGNOSIS — F17210 Nicotine dependence, cigarettes, uncomplicated: Secondary | ICD-10-CM | POA: Diagnosis not present

## 2020-06-02 DIAGNOSIS — E1165 Type 2 diabetes mellitus with hyperglycemia: Secondary | ICD-10-CM

## 2020-06-02 DIAGNOSIS — M47819 Spondylosis without myelopathy or radiculopathy, site unspecified: Secondary | ICD-10-CM | POA: Insufficient documentation

## 2020-06-02 DIAGNOSIS — I10 Essential (primary) hypertension: Secondary | ICD-10-CM

## 2020-06-02 DIAGNOSIS — Z79899 Other long term (current) drug therapy: Secondary | ICD-10-CM | POA: Insufficient documentation

## 2020-06-02 DIAGNOSIS — J42 Unspecified chronic bronchitis: Secondary | ICD-10-CM | POA: Diagnosis not present

## 2020-06-02 DIAGNOSIS — E1151 Type 2 diabetes mellitus with diabetic peripheral angiopathy without gangrene: Secondary | ICD-10-CM | POA: Insufficient documentation

## 2020-06-02 DIAGNOSIS — Z1211 Encounter for screening for malignant neoplasm of colon: Secondary | ICD-10-CM

## 2020-06-02 DIAGNOSIS — G8929 Other chronic pain: Secondary | ICD-10-CM | POA: Insufficient documentation

## 2020-06-02 DIAGNOSIS — J4489 Other specified chronic obstructive pulmonary disease: Secondary | ICD-10-CM

## 2020-06-02 DIAGNOSIS — Z7951 Long term (current) use of inhaled steroids: Secondary | ICD-10-CM | POA: Diagnosis not present

## 2020-06-02 DIAGNOSIS — Z9114 Patient's other noncompliance with medication regimen: Secondary | ICD-10-CM | POA: Insufficient documentation

## 2020-06-02 LAB — POCT GLYCOSYLATED HEMOGLOBIN (HGB A1C): HbA1c, POC (controlled diabetic range): 10.4 % — AB (ref 0.0–7.0)

## 2020-06-02 LAB — GLUCOSE, POCT (MANUAL RESULT ENTRY)
POC Glucose: 376 mg/dl — AB (ref 70–99)
POC Glucose: 403 mg/dl — AB (ref 70–99)

## 2020-06-02 MED ORDER — BASAGLAR KWIKPEN 100 UNIT/ML ~~LOC~~ SOPN: 20.0000 [IU] | PEN_INJECTOR | Freq: Every day | SUBCUTANEOUS | 2 refills | Status: DC

## 2020-06-02 MED ORDER — EMPAGLIFLOZIN 25 MG PO TABS
25.0000 mg | ORAL_TABLET | Freq: Every day | ORAL | 6 refills | Status: DC
Start: 2020-06-02 — End: 2020-06-02

## 2020-06-02 MED ORDER — IPRATROPIUM-ALBUTEROL 0.5-2.5 (3) MG/3ML IN SOLN
3.0000 mL | Freq: Four times a day (QID) | RESPIRATORY_TRACT | 0 refills | Status: DC
Start: 2020-06-02 — End: 2020-07-15

## 2020-06-02 MED ORDER — LOSARTAN POTASSIUM-HCTZ 100-25 MG PO TABS
ORAL_TABLET | ORAL | 3 refills | Status: DC
Start: 2020-06-02 — End: 2020-06-19

## 2020-06-02 MED ORDER — INSULIN ASPART 100 UNIT/ML ~~LOC~~ SOLN
8.0000 [IU] | Freq: Once | SUBCUTANEOUS | Status: AC
  Administered 2020-06-02: 8 [IU] via SUBCUTANEOUS

## 2020-06-02 MED FILL — BASAGLAR 100 UNIT/ML KWIKPE: 100 | 30 days supply | Qty: 6 | Fill #0

## 2020-06-02 MED FILL — JARDIANCE 25 MG TABLET: 25 | 30 days supply | Qty: 30 | Fill #0

## 2020-06-02 MED FILL — IPRAT-ALBUT 0.5-3(2.5) MG/3: 0.5-2.5 (3) | 28 days supply | Qty: 360 | Fill #0

## 2020-06-02 NOTE — Assessment & Plan Note (Addendum)
Poorly controlled type 2 diabetes plan is to begin with insulin glargine with an increase of dose to 20 units daily patient will hold losartan HCT given volume depletion Continue Trulicity weekly Discontinue Amaryl Return in short-term follow-up with clinical pharmacist

## 2020-06-02 NOTE — Patient Instructions (Addendum)
Begin insulin glargine 20 units nightly injection Stop Amaryl glimepiride Continue Trulicity once a week  We gave you 8 units of insulin at this visit  Stop losartan hydrochlorthiazide for now because of low blood pressure  Refills on all your medications were sent to our pharmacy  A Cologuard kit will be mailed to your house please provide a stool follow the directions and mail it back  Please get an eye exam this year ophthalmology resources given to you that take Medicare   Please keep yourself well-hydrated  Return 1 week for recheck with clinical pharmacist  See Dr. Joya Gaskins in 6 weeks

## 2020-06-02 NOTE — Assessment & Plan Note (Signed)
COPD at baseline no change in inhalers refills on nebulizer medicine given

## 2020-06-10 ENCOUNTER — Ambulatory Visit: Payer: Medicare Other | Admitting: Pharmacist

## 2020-06-18 ENCOUNTER — Other Ambulatory Visit: Payer: Self-pay

## 2020-06-18 ENCOUNTER — Ambulatory Visit: Payer: Medicare Other | Attending: Critical Care Medicine

## 2020-06-18 DIAGNOSIS — Z23 Encounter for immunization: Secondary | ICD-10-CM

## 2020-06-18 NOTE — Progress Notes (Signed)
   Covid-19 Vaccination Clinic  Name:  Joshua [name]    MRN: [id number] DOB: [birth year]  06/18/2020  Joshua [name] was observed post Covid-19 immunization for 15 minutes without incident. He was provided with Vaccine Information Sheet and instruction to access the V-Safe system.   Joshua [name] was instructed to call 911 with any severe reactions post vaccine: Marland Kitchen Difficulty breathing  . Swelling of face and throat  . A fast heartbeat  . A bad rash all over body  . Dizziness and weakness   Immunizations Administered    Name Date Dose VIS Date Route   Moderna Covid-19 Booster Vaccine 06/18/2020  4:42 PM 0.25 mL 02/13/2020 Intramuscular   Manufacturer: Levan Hurst   Lot: [id number]   [place]: [id number]

## 2020-06-19 ENCOUNTER — Ambulatory Visit: Payer: Self-pay

## 2020-06-19 ENCOUNTER — Encounter: Payer: Self-pay | Admitting: Pharmacist

## 2020-06-19 ENCOUNTER — Other Ambulatory Visit: Payer: Self-pay

## 2020-06-19 ENCOUNTER — Ambulatory Visit: Payer: Medicare Other | Attending: Critical Care Medicine | Admitting: Pharmacist

## 2020-06-19 VITALS — BP 150/99 | HR 74

## 2020-06-19 DIAGNOSIS — Z794 Long term (current) use of insulin: Secondary | ICD-10-CM | POA: Diagnosis not present

## 2020-06-19 DIAGNOSIS — E1165 Type 2 diabetes mellitus with hyperglycemia: Secondary | ICD-10-CM

## 2020-06-19 MED ORDER — VALSARTAN 80 MG PO TABS: 80.0000 mg | ORAL_TABLET | Freq: Every day | ORAL | 0 refills | Status: DC

## 2020-06-19 NOTE — Patient Instructions (Signed)
Thank you for coming to see me today. Please do the following:  1. Continue 20 units of insulin. Take once a day.  2. Continue Trulicity once a week.  3. Start a new blood pressure medication called valsartan. Take 1 tablet once a day.  4. Continue checking blood sugars at home. 5. Continue making the lifestyle changes we've discussed together during our visit. Diet and exercise play a significant role in improving your blood sugars.  6. Follow-up with me in 2-3 weeks.   Hypoglycemia or low blood sugar:   Low blood sugar can happen quickly and may become an emergency if not treated right away.   While this shouldn't happen often, it can be brought upon if you skip a meal or do not eat enough. Also, if your insulin or other diabetes medications are dosed too high, this can cause your blood sugar to go to low.   Warning signs of low blood sugar include: 1. Feeling shaky or dizzy 2. Feeling weak or tired  3. Excessive hunger 4. Feeling anxious or upset  5. Sweating even when you aren't exercising  What to do if I experience low blood sugar? 1. Check your blood sugar with your meter. If lower than 70, proceed to step 2.  2. Treat with 3-4 glucose tablets or 3 packets of regular sugar. If these aren't around, you can try hard candy. Yet another option would be to drink 4 ounces of fruit juice or 6 ounces of REGULAR soda.  3. Re-check your sugar in 15 minutes. If it is still below 70, do what you did in step 2 again. If has come back up, go ahead and eat a snack or small meal at this time.

## 2020-06-19 NOTE — Progress Notes (Signed)
    S:    PCP: Dr. Joya Gaskins   No chief complaint on file.  Patient arrives in good spirits. Presents for diabetes management at the request of Dr. Joya Gaskins. Patient was referred on 06/02/2020. At that visit, Nancee Liter was started. Glimepiride was stopped. Of note, pt's BP was low. He was instructed to hold his Hyzaar.   Today, he presents with complaints of HA that started shortly after holding his Hyzaar. He brings his glucometer and reports that his glycemic control at home is improving.  Family/Social History:  - FHx: DM (mother, brother), heart disease (father), HTN (father) - Tobacco: current every day smoker (2 packs per day) - Alcohol: denies  - Other substances: endorses daily use of marijuana   Insurance coverage/medication affordability:  - Medicare   Patient reports adherence with medications.   Current diabetes medications include:  - Basaglar 20 units daily - Trulicity 6.81 mg weekly   Current hypertension medications include:  - Losartan-HCTZ (holding)  Patient denies hypoglycemic events.  Patient reported dietary habits:  - Eats 1 meal/day (supper) secondary to poor appetite throughout the day - Reports eating a large supper later at night before bed - Non-compliant with diet: endorses high intake of carbs at supper (lists potato chips as an example)  Patient-reported exercise habits:  - None   Patient denies polyuria, polydipsia.  Patient reports baseline neuropathy. Patient denies visual changes. Patient reports self foot exams.   O:  Glucose range over the past 2 weeks:  Fasting CBGs: 116 - 177 Post-prandial CBGs: 155 - 299  Lab Results  Component Value Date   HGBA1C 10.4 (A) 06/02/2020   Vitals:   06/19/20 1511  BP: (!) 150/99  Pulse: 74   Lipid Panel     Component Value Date/Time   CHOL 144 07/20/2019 1116   TRIG 246 (H) 07/20/2019 1116   HDL 32 (L) 07/20/2019 1116   CHOLHDL 4.5 07/20/2019 1116   CHOLHDL 3.3 09/30/2016 0651   VLDL 35  09/30/2016 0651   LDLCALC 72 07/20/2019 1116   Clinical ASCVD: No  The ASCVD Risk score Mikey Bussing DC Jr., et al., 2013) failed to calculate for the following reasons:   The valid total cholesterol range is 130 to 320 mg/dL    A/P: Diabetes longstanding currently uncontrolled but sugars are improving. Patient is able to verbalize appropriate hypoglycemia management plan. Patient is adherent with medication. Control is suboptimal due to dietary indiscretion and physical inactivity. Given his improvement, will hold off on additional changes, however, he is still above goal. Will have him return in 2-3 weeks for insulin titration. -Continue Basaglar 20 units daily. -ContinueTrulicity 1.57 mg weekly on Mondays. -Extensively discussed pathophysiology of DM, recommended lifestyle interventions, dietary effects on glycemic control -Counseled on s/sx of and management of hypoglycemia -Next A1C anticipated 08/2020  HTN:  HTN longstanding currently above goal. BP < 130/80 mmHg. I believe his increased BP is contributing to his HA. Will restart therapy. Combo ARB/thiazide therapy caused hypotension/volume depletion. Will avoid diuresis at this time.  -Start valsartan 80 mg daily  Written patient instructions provided. Total time in face to face counseling 30 minutes.   Follow up w/ me in 2-3.      Benard Halsted, PharmD, [place] [name] [phone]

## 2020-06-26 ENCOUNTER — Telehealth: Payer: Self-pay | Admitting: Critical Care Medicine

## 2020-06-26 DIAGNOSIS — G894 Chronic pain syndrome: Secondary | ICD-10-CM

## 2020-06-26 DIAGNOSIS — M545 Low back pain, unspecified: Secondary | ICD-10-CM

## 2020-06-26 MED ORDER — TRAMADOL HCL 50 MG PO TABS: 50.0000 mg | ORAL_TABLET | Freq: Four times a day (QID) | ORAL | 3 refills | Status: DC | PRN

## 2020-06-26 NOTE — Telephone Encounter (Signed)
Copied from [place] [id number]. Topic: General - Other >> Jun 26, 2020 11:30 AM Tessa Lerner A wrote: Reason for CRM: Patient is requesting contact from staff  Patient would like to provide additional updates on their health Please contact to advise

## 2020-06-26 NOTE — Telephone Encounter (Signed)
Requested medication (s) are due for refill today: yes  Requested medication (s) are on the active medication list: yes  Last refill:  01/28/20 #120 3 refills  Future visit scheduled: yes in 2 weeks   Notes to clinic:  not delegated per protocol     Requested Prescriptions  Pending Prescriptions Disp Refills   traMADol (ULTRAM) 50 MG tablet 120 tablet 3    Sig: Take 1 tablet (50 mg total) by mouth every 6 (six) hours as needed for moderate pain or severe pain.      Not Delegated - Analgesics:  Opioid Agonists Failed - 06/26/2020 11:33 AM      Failed - This refill cannot be delegated      Passed - Urine Drug Screen completed in last 360 days      Passed - Valid encounter within last 6 months    Recent Outpatient Visits           1 week ago Type 2 diabetes mellitus with hyperglycemia, with long-term current use of insulin [facility])   [place], Annie Main L, RPH-CPP   3 weeks ago Type 2 diabetes mellitus with hyperglycemia, with long-term current use of insulin [facility])   [place] Elsie Stain, MD   1 month ago Chronic migraine without aura without status migrainosus, not intractable   [place], Charlane Ferretti, MD   2 months ago COPD with chronic bronchitis [facility])   [place] Elsie Stain, MD   3 months ago Type 2 diabetes mellitus with hyperglycemia, with long-term current use of insulin [facility])   [place] Elsie Stain, MD       Future Appointments             In 1 week Daisy Blossom, Jarome Matin, [place]   In 2 weeks Joya Gaskins Burnett Harry, MD [place]

## 2020-06-26 NOTE — Telephone Encounter (Signed)
Copied from [place] [id number]. Topic: Quick Communication - Rx Refill/Question >> Jun 26, 2020 11:28 AM Tessa Lerner A wrote: Medication: traMADol (ULTRAM) 50 MG tablet   Has the patient contacted their pharmacy? No. Patient was directed by PCP to contact office before they contacted pharmacy   Preferred Pharmacy (with phone number or street name): [id number] - [address], [address] [place]  Phone:  [phone]  Agent: Please be advised that RX refills may take up to 3 business days. We ask that you follow-up with your pharmacy.

## 2020-06-30 ENCOUNTER — Other Ambulatory Visit: Payer: Self-pay | Admitting: Critical Care Medicine

## 2020-06-30 DIAGNOSIS — M545 Low back pain, unspecified: Secondary | ICD-10-CM

## 2020-06-30 DIAGNOSIS — G894 Chronic pain syndrome: Secondary | ICD-10-CM

## 2020-06-30 MED ORDER — TRAMADOL HCL 50 MG PO TABS: 50.0000 mg | ORAL_TABLET | Freq: Four times a day (QID) | ORAL | 3 refills | Status: DC | PRN

## 2020-06-30 NOTE — Telephone Encounter (Signed)
Refill was sent for traMADol (ULTRAM) 50 MG tablet On 3.3.22 but the class says "print" so the pharmacy didn't receive RX/ please resend and advise

## 2020-06-30 NOTE — Telephone Encounter (Signed)
Please see note about prescription being sent as class "print". Please follow up.

## 2020-06-30 NOTE — Telephone Encounter (Signed)
Requested medication (s) are due for refill today  Yes  Requested medication (s) are on the active medication list Yes  Future visit scheduled Yes in 2 weeks.  Note to clinic-This was refilled on 06/26/20 but was not received by pharmacy to to labeled PRINT. Pharmacy on file,please.    Requested Prescriptions  Signed Prescriptions Disp Refills   traMADol (ULTRAM) 50 MG tablet 120 tablet 3    Sig: Take 1 tablet (50 mg total) by mouth every 6 (six) hours as needed for moderate pain or severe pain.      Not Delegated - Analgesics:  Opioid Agonists Failed - 06/26/2020 11:33 AM      Failed - This refill cannot be delegated      Passed - Urine Drug Screen completed in last 360 days      Passed - Valid encounter within last 6 months    Recent Outpatient Visits           1 week ago Type 2 diabetes mellitus with hyperglycemia, with long-term current use of insulin [facility])   [place], Annie Main L, RPH-CPP   4 weeks ago Type 2 diabetes mellitus with hyperglycemia, with long-term current use of insulin [facility])   [place] Elsie Stain, MD   1 month ago Chronic migraine without aura without status migrainosus, not intractable   [place], Charlane Ferretti, MD   2 months ago COPD with chronic bronchitis [facility])   [place] Elsie Stain, MD   3 months ago Type 2 diabetes mellitus with hyperglycemia, with long-term current use of insulin [facility])   [place] Elsie Stain, MD       Future Appointments             In 1 week Daisy Blossom, Jarome Matin, [place]   In 2 weeks Joya Gaskins Burnett Harry, MD [place]

## 2020-06-30 NOTE — Telephone Encounter (Signed)
Left message on voicemail to return call.  Nurse call to f/u with updates he wishes to share.

## 2020-06-30 NOTE — Progress Notes (Signed)
Tramadol refill

## 2020-06-30 NOTE — Telephone Encounter (Signed)
I already did this Rx this AM.   It shows it went electronically to his [facility]

## 2020-07-01 ENCOUNTER — Other Ambulatory Visit: Payer: Self-pay | Admitting: Family Medicine

## 2020-07-01 ENCOUNTER — Other Ambulatory Visit: Payer: Self-pay | Admitting: *Deleted

## 2020-07-01 ENCOUNTER — Telehealth: Payer: Self-pay | Admitting: Critical Care Medicine

## 2020-07-01 MED FILL — JARDIANCE 25 MG TABLET: 25 | 30 days supply | Qty: 30 | Fill #1

## 2020-07-01 NOTE — Telephone Encounter (Signed)
Copied from [place] [id number]. Topic: Quick Communication - Rx Refill/Question >> Jul 01, 2020  9:08 AM [place] wrote: Medication:TRULICITY 1.01 BP/1.0CH SOPN 2 mL 2 03/21/2020   Sig: ADMINISTER 0.5 ML UNDER THE SKIN 1 TIME A WEEK     Has the patient contacted their pharmacy? Yes  (Agent: If no, request that the patient contact the pharmacy   for the refill.) (Agent: If yes, when and what did the pharmacy advise?) call dr  Preferred Pharmacy (with phone number or street name):[facility] [place], [address] AT [address]  Phone:  [phone] Fax:  [phone]   Agent: Please be advised that RX refills may take up to 3 business days. We ask that you follow-up with your pharmacy. Last appt 2/7/ nxt  3/15/and 3/22

## 2020-07-01 NOTE — Telephone Encounter (Signed)
Requested Prescriptions  Pending Prescriptions Disp Refills  . Dulaglutide (TRULICITY) 9.45 OP/9.2TW SOPN [Pharmacy Med Name: TRULICITY 0.75MG /0.5ML SDP 0.5ML] 2 mL 2    Sig: ADMINISTER 0.75 MG UNDER THE SKIN 1 TIME A WEEK     Endocrinology:  Diabetes - GLP-1 Receptor Agonists Failed - 07/01/2020  8:01 PM      Failed - HBA1C is between 0 and 7.9 and within 180 days    HbA1c, POC (controlled diabetic range)  Date Value Ref Range Status  06/02/2020 10.4 (A) 0.0 - 7.0 % Final         Passed - Valid encounter within last 6 months    Recent Outpatient Visits          1 week ago Type 2 diabetes mellitus with hyperglycemia, with long-term current use of insulin ([place])   [place], Annie Main L, RPH-CPP   4 weeks ago Type 2 diabetes mellitus with hyperglycemia, with long-term current use of insulin [facility])   [place] Elsie Stain, MD   1 month ago Chronic migraine without aura without status migrainosus, not intractable   [place], Charlane Ferretti, MD   3 months ago COPD with chronic bronchitis [facility])   [place] Elsie Stain, MD   3 months ago Type 2 diabetes mellitus with hyperglycemia, with long-term current use of insulin [facility])   [place] Elsie Stain, MD      Future Appointments            In 1 week Daisy Blossom, Jarome Matin, [place]   In 2 weeks Joya Gaskins Burnett Harry, MD [place]

## 2020-07-02 NOTE — Telephone Encounter (Signed)
Left message on voicemail to return call if he still has questions. 2nd attempt.

## 2020-07-08 ENCOUNTER — Other Ambulatory Visit: Payer: Self-pay

## 2020-07-08 ENCOUNTER — Ambulatory Visit: Payer: Medicare Other | Attending: Critical Care Medicine | Admitting: Pharmacist

## 2020-07-08 ENCOUNTER — Other Ambulatory Visit: Payer: Self-pay | Admitting: Pharmacist

## 2020-07-08 VITALS — BP 131/88

## 2020-07-08 DIAGNOSIS — Z794 Long term (current) use of insulin: Secondary | ICD-10-CM | POA: Diagnosis not present

## 2020-07-08 DIAGNOSIS — E1165 Type 2 diabetes mellitus with hyperglycemia: Secondary | ICD-10-CM

## 2020-07-08 LAB — GLUCOSE, POCT (MANUAL RESULT ENTRY): POC Glucose: 134 mg/dl — AB (ref 70–99)

## 2020-07-08 MED ORDER — TRULICITY 1.5 MG/0.5ML ~~LOC~~ SOAJ: 1.5000 mg | SUBCUTANEOUS | 2 refills | Status: DC

## 2020-07-08 NOTE — Progress Notes (Signed)
    S:    PCP: Dr. Joya Gaskins   No chief complaint on file.  Patient arrives in good spirits. Presents for diabetes management at the request of Dr. Joya Gaskins. Patient was referred on 06/02/2020. CPP last saw him 06/19/2020. Basaglar and Trulicity were continued and valsartan was started for high BP at this visit. Complained of HA at CPP visit but reports today that it is improving.  Family/Social History:  - FHx: DM (mother, brother), heart disease (father), HTN (father) - Tobacco: current every day smoker (2 packs per day) - Alcohol: denies  - Other substances: endorses daily use of marijuana   Insurance coverage/medication affordability:  - Medicare   Patient reports adherence with medications.   Current diabetes medications include:  - Basaglar 20 units daily - Trulicity 9.83 mg weekly   Current hypertension medications include:  - Valsartan 80 mg daily  Patient denies hypoglycemic events.  Patient reported dietary habits:  - Eats 1 meal/day (supper) secondary to poor appetite throughout the day - Reports eating a large supper later at night before bed - Non-compliant with diet: endorses high intake of carbs at supper (lists potato chips as an example)  Patient-reported exercise habits:  - None   Patient denies polyuria, polydipsia.  Patient reports baseline neuropathy. Patient denies visual changes. Patient reports self foot exams.   O:  POCT: 134  CBGs: pt brings his meter today glucose averages are as follows - 7 day avg: 118 - 14 day avg: 125 - 30 day avg: 151  Lab Results  Component Value Date   HGBA1C 10.4 (A) 06/02/2020   Vitals:   07/08/20 1509  BP: 131/88   Lipid Panel     Component Value Date/Time   CHOL 144 07/20/2019 1116   TRIG 246 (H) 07/20/2019 1116   HDL 32 (L) 07/20/2019 1116   CHOLHDL 4.5 07/20/2019 1116   CHOLHDL 3.3 09/30/2016 0651   VLDL 35 09/30/2016 0651   LDLCALC 72 07/20/2019 1116   Clinical ASCVD: No  The ASCVD Risk score  Mikey Bussing DC Jr., et al., 2013) failed to calculate for the following reasons:   The valid total cholesterol range is 130 to 320 mg/dL    A/P: Diabetes longstanding currently uncontrolled but sugars are improving. Patient is able to verbalize appropriate hypoglycemia management plan. Patient is adherent with medication. Control is suboptimal due to dietary indiscretion and physical inactivity. BGs are greatly improved. Only change today is increasing Trulicity to 1.5 mg weekly for maximum CV benefit with hopes to decrease insulin in the future. -Continue Basaglar 20 units daily. -Increase Trulicity 1.5 mg weekly on Mondays. -Extensively discussed pathophysiology of DM, recommended lifestyle interventions, dietary effects on glycemic control -Counseled on s/sx of and management of hypoglycemia -Next A1C anticipated 08/2020  HTN:  HTN longstanding currently above goal. BP < 130/80 mmHg. HA has improved likely due to new BP lowering therapy. BP was near goal so no therapy changes today, but we did inform him that we may have to increase valsartan dose in the future. Continued to counsel on lifestyle and diet modifications. -Continued valsartan 80 mg daily  Written patient instructions provided. Total time in face to face counseling 30 minutes.    Benard Halsted, PharmD, [place], [place], PharmD Candidate [employer] Class of 2024

## 2020-07-15 ENCOUNTER — Other Ambulatory Visit: Payer: Self-pay

## 2020-07-15 ENCOUNTER — Other Ambulatory Visit: Payer: Self-pay | Admitting: Critical Care Medicine

## 2020-07-15 ENCOUNTER — Ambulatory Visit: Payer: Medicare Other | Attending: Critical Care Medicine | Admitting: Critical Care Medicine

## 2020-07-15 ENCOUNTER — Ambulatory Visit: Payer: Self-pay | Admitting: *Deleted

## 2020-07-15 ENCOUNTER — Encounter: Payer: Self-pay | Admitting: Critical Care Medicine

## 2020-07-15 VITALS — BP 107/64 | HR 84 | Resp 16 | Wt 246.8 lb

## 2020-07-15 DIAGNOSIS — E1165 Type 2 diabetes mellitus with hyperglycemia: Secondary | ICD-10-CM | POA: Insufficient documentation

## 2020-07-15 DIAGNOSIS — Z791 Long term (current) use of non-steroidal anti-inflammatories (NSAID): Secondary | ICD-10-CM | POA: Diagnosis not present

## 2020-07-15 DIAGNOSIS — J42 Unspecified chronic bronchitis: Secondary | ICD-10-CM | POA: Diagnosis not present

## 2020-07-15 DIAGNOSIS — J449 Chronic obstructive pulmonary disease, unspecified: Secondary | ICD-10-CM | POA: Diagnosis not present

## 2020-07-15 DIAGNOSIS — G43009 Migraine without aura, not intractable, without status migrainosus: Secondary | ICD-10-CM | POA: Insufficient documentation

## 2020-07-15 DIAGNOSIS — M545 Low back pain, unspecified: Secondary | ICD-10-CM | POA: Insufficient documentation

## 2020-07-15 DIAGNOSIS — Z23 Encounter for immunization: Secondary | ICD-10-CM | POA: Diagnosis not present

## 2020-07-15 DIAGNOSIS — I10 Essential (primary) hypertension: Secondary | ICD-10-CM | POA: Insufficient documentation

## 2020-07-15 DIAGNOSIS — F250 Schizoaffective disorder, bipolar type: Secondary | ICD-10-CM | POA: Insufficient documentation

## 2020-07-15 DIAGNOSIS — Z79899 Other long term (current) drug therapy: Secondary | ICD-10-CM | POA: Insufficient documentation

## 2020-07-15 DIAGNOSIS — I739 Peripheral vascular disease, unspecified: Secondary | ICD-10-CM | POA: Diagnosis not present

## 2020-07-15 DIAGNOSIS — Z720 Tobacco use: Secondary | ICD-10-CM | POA: Diagnosis not present

## 2020-07-15 DIAGNOSIS — Z7984 Long term (current) use of oral hypoglycemic drugs: Secondary | ICD-10-CM | POA: Insufficient documentation

## 2020-07-15 DIAGNOSIS — Z794 Long term (current) use of insulin: Secondary | ICD-10-CM | POA: Insufficient documentation

## 2020-07-15 DIAGNOSIS — Z716 Tobacco abuse counseling: Secondary | ICD-10-CM | POA: Diagnosis not present

## 2020-07-15 DIAGNOSIS — G894 Chronic pain syndrome: Secondary | ICD-10-CM | POA: Insufficient documentation

## 2020-07-15 DIAGNOSIS — F17210 Nicotine dependence, cigarettes, uncomplicated: Secondary | ICD-10-CM | POA: Insufficient documentation

## 2020-07-15 DIAGNOSIS — Z888 Allergy status to other drugs, medicaments and biological substances status: Secondary | ICD-10-CM | POA: Insufficient documentation

## 2020-07-15 LAB — GLUCOSE, POCT (MANUAL RESULT ENTRY): POC Glucose: 175 mg/dl — AB (ref 70–99)

## 2020-07-15 MED ORDER — SUMATRIPTAN SUCCINATE 50 MG PO TABS
50.0000 mg | ORAL_TABLET | ORAL | 0 refills | Status: DC | PRN
Start: 2020-07-15 — End: 2020-09-15

## 2020-07-15 MED ORDER — VALSARTAN 80 MG PO TABS: 80.0000 mg | ORAL_TABLET | Freq: Every day | ORAL | 2 refills | Status: DC

## 2020-07-15 MED ORDER — IPRATROPIUM-ALBUTEROL 0.5-2.5 (3) MG/3ML IN SOLN: 3.0000 mL | Freq: Four times a day (QID) | RESPIRATORY_TRACT | 0 refills | Status: DC

## 2020-07-15 MED FILL — IPRAT-ALBUT 0.5-3(2.5) MG/3: 0.5-2.5 (3) | 30 days supply | Qty: 360 | Fill #0

## 2020-07-15 NOTE — Assessment & Plan Note (Signed)
Has pain contract will refill tramadol [place] drug database has been checked frequently

## 2020-07-15 NOTE — Assessment & Plan Note (Signed)
Switch to Imitrex 50 mg every 2 hours as needed for migraine may repeat with twice

## 2020-07-15 NOTE — Assessment & Plan Note (Signed)
  .   Current smoking consumption amount: 1 pack a day  . Dicsussion on advise to quit smoking and smoking impacts: Impacts on lung health  . Patient's willingness to quit: Not willing to completely quit  . Methods to quit smoking discussed: Behavioral modification  . Medication management of smoking session drugs discussed: Has tried and failed nicotine replacement  . Resources provided:  AVS   . Setting quit date not yet established  . Follow-up arranged 3 weeks   Time spent counseling the patient: 5 minutes

## 2020-07-15 NOTE — Telephone Encounter (Addendum)
Patient called, left VM to return the call to the office to discuss with a nurse questions about BP medication. 2 attempts to reach patient by PEC NT, will route to office for resolution.  Summary: Med advice   Pts wife was confused about the Pts blood pressure meds and had some questions.  Caller was asking about valsartan (DIOVAN) 80 MG tablet and what it is used for. Also had some questions regarding JARDIANCE 25 MG TABS tablet.

## 2020-07-15 NOTE — Assessment & Plan Note (Signed)
Blood pressure at goal no change in medications continue the valsartan 80 mg dose

## 2020-07-15 NOTE — Assessment & Plan Note (Signed)
Continue inhaled medications as prescribed

## 2020-07-15 NOTE — Progress Notes (Signed)
Subjective:    Patient ID: Joshua [name], male    DOB: [birth year], 65 y.o.   MRN: [id number]   History of Present Illness:  03/27/19 This is a video telehealth visit for a patient who has underlying COPD and was needing further follow-up upon referral from the patient's primary care provider.  The patient's been on several rounds of prednisone antibiotics with only improvement while on prednisone.  He states he is short of breath at rest and with exertion.  He has had problems with breathing for many years got worse over the past year.  He also has underlying diabetes on Trulicity and insulin long-acting.  He also states he has chronic low back pain and has difficulty ambulating this affects his breathing as well.  His weight is gone up as well which is aggravated his chronic pain.  He had a pain management doctor but has not seen that doctor since November 2019.  The patient is on tramadol from an urgent care visit he states this is helped to some degree.  He states nonsteroidals and gabapentin have not been of use in the past.  He also states that he has increased fluid retention and is questioning whether his losartan can be adjusted to accommodate reduction in fluid retention.  He still smokes 2 pack a day of cigarettes and previous smoked as much as 4 packs a day.  He has not had a pulmonary function test.  Chest x-ray in July of this year showed no active disease.  The patient does have a Symbicort inhaler and is using this however only on an as needed basis along with albuterol which is as needed  Note when I tried to do a medication reconciliation the patient was not clear on which medicines he was taking and which ones he had stopped and he has a list of nearly 40 medicines in his E HR.  04/10/2019 This is a follow-up visit from a video visit that occurred recently.  The patient was given doxycycline and pulse prednisone for COPD exacerbation and is maintaining Symbicort daily.  The  patient was referred for the COPD evaluation.  I also refill the patient's tramadol at the last visit.  He has pending pulmonary function test which have yet to be obtained.  He does state that his back is improved on the tramadol.  He also states that he has broken teeth and they will bite into his gum and mouth causing pain in the mouth.  The tramadol helps to some degree but is requesting a mouthwash.  He has not seen a dentist recently.  Patient is still smoking 2 packs a day of cigarettes.  The patient did bring all his current active medications in with him today and I did a complete medication reconciliation see is current new medication list  04/17/2019 Video visit request re dyspnea Copd  This patient was last seen in person on December 15 and since that time the patient has had recurrent exacerbation of COPD.  He has been using his Symbicort daily.  He states he awoke with acute shortness of breath and lung congestion.  This occurred 2 days ago.  He also had increased pain in the right side.  He has been coughing up profuse thick yellow mucus.  He is using the flutter valve and the nebulizer which helps to remove some of the mucus.  His mouth pain is improved with the [place]ukes mouthwash and would like a refill.  He is also  out of his tramadol and is yet to achieve a chronic pain management appointment.  There is no fever.  Note he just had a negative Covid test December 18 in preparation for pulmonary function studies which have yet to be scheduled  1/18: This is a follow-up from previous visit in December that was a video visit.  The patient did receive a round of Levaquin and prednisone and he improved to some degree.  He only has albuterol for his nebulizer and is about to run out.  The patient worsened since he ran out of prednisone and the antibiotic.  He still has a lot of phlegm that is difficult to raise.  He is not using his flutter valve every day.  He continues to smoke 1 pack a day of  cigarettes but previously was at 2 packs a day The patient notes pressure in the frontal and sinus area and he denies any ear pain  Note his GAD-7 score is 6 and PHQ-9 score is 9 today  He states he cannot afford the co-pay at this time of the year with his insurance.  He has run out of insulin for the past 10 days and would prefer to have his medications filled at our pharmacy  The patient also had a referral to [facility] pain center but they declined his case.  Patient's been taking 50 mg 3 times daily of the tramadol  06/28/2019 Telephone visit: Since the last visit the patient states he still having shortness of breath but his cough and mucus production as well as breathing are better since taking the course of antibiotics and prednisone.  He is now down to 1 pack a day from 3 packs a day of cigarettes.  He states he still has no energy.  He states his lower back pain is under reasonable control with tramadol.  The patient is on Trulicity and insulin glargine for his diabetes but has run out of testing strips.  He also wishes a refill on his Maxalt for his migraines.  The patient maintains his inhaled medications without changes.  09/18/2019 Patient seen in return follow-up and his shortness of breath is much improved.  He is coughing less.  He is smoking less.  He has no new complaints other than chronic back pain  11/20/2019 This visit is a video telehealth visit for this patient who has chronic lung disease, hypertension, peripheral vascular disease, type 2 diabetes, chronic low back pain.  The patient states last several weeks and been difficult with change in weather being very hot and humid.  The patient notes he ha s had chest congestion cough and more shortness of breath.  Patient's not been using his Advair on a regular basis.  He is about out of tramadol will need refills soon.  He is requesting a refill on his Maxalt.  He also would like mouthwash because his throat is irritated again.   He is yet to receive the Covid vaccine and is interested in this.  Patient states he is no longer on Basaglar insulin he is on Trulicity and Amaryl  The patient needs more glucose testing supplies  02/06/2020 telehealth visit for copd The patient can only connect by phone today.  He states he is having increased wheezing productive cough.  He is still smoking 1 to 2 cigarettes a day is trying to reduce this.  He would like a flu vaccine.  He did receive his Covid vaccine series.  He has some headaches and the  mucus is yellow.  See a COPD assessment below  03/06/2020 PCP patient of dr fulp   Here for Copd f/u Patient states his shortness of breath continues.  He was just seen by primary care and given a course of doxycycline he just started on the ninth.  He had yellow-green mucus notes clear but thick foamy.  He is still short of breath minimal exertion.  He states prednisone causes his sugars to go up and he is 275 on arrival today with his glucose he has not been taking his Amaryl regularly.  He states prednisone causes side effects.  He still smoking a pack a day of cigarettes.  See shortness of breath assessment below  The patient states he is unable to take a deep breath with the Advair inhaler at this time    04/02/2020 Copd f/u and needs new PCP former [facility].  HTN, Copd, Migraine, PAD, chronic pain, T2DM, DJD, GAD, Schizoaffective, tobacco, spinal stenosis.   On Tramadol chronically , has pain contract  This is a telephone visit for this patient with underlying COPD chronic pain here for COPD follow-up.  He states he now has a home nebulizer doing well on the DuoNeb 4 times daily he is still however coughing up some yellow to white mucus with increased wheeze  His chronic pain is being well managed he has no other complaints  06/02/2020 Patient seen in return follow-up states his breathing is at baseline feels somewhat weak has not been taking in fluids much on arrival blood  pressure is 81/60 pulse 120 blood glucose 403  Patient has been skipping some of his doses of medications for his diabetes  No other complaints  07/15/20 Patient seen return follow-up complains of ongoing migraine headache states current migraine medicine ineffective  His headaches were worse than when he was on the insulin glargine his blood sugar got down to 85-100 and with this though it was normal cause headaches when he stopped the insulin and his sugars ran in the 1 20-1 30 range the headaches went away  Patient is maintaining his Trulicity Patient needs refills on his nebulized medication   COPD He complains of cough, shortness of breath, sputum production and wheezing. There is no chest tightness, difficulty breathing, frequent throat clearing, hemoptysis or hoarse voice. This is a chronic problem. The current episode started more than 1 year ago. The problem occurs constantly. The problem has been unchanged. The cough is productive of sputum and productive of purulent sputum. Associated symptoms include dyspnea on exertion, headaches, nasal congestion, orthopnea and rhinorrhea. Pertinent negatives include no appetite change, chest pain, ear congestion, fever, heartburn, malaise/fatigue, myalgias, PND, postnasal drip, sneezing, sore throat, sweats or trouble swallowing. His symptoms are aggravated by any activity, change in weather, climbing stairs, lying down, exposure to fumes, strenuous activity, emotional stress and minimal activity. His symptoms are alleviated by beta-agonist, oral steroids and steroid inhaler. Risk factors for lung disease include smoking/tobacco exposure. His past medical history is significant for COPD.   Past Medical History:  Diagnosis Date  . Back pain   . Bipolar disorder ([place])   . Cannabis use disorder, moderate, dependence ([place])   . Chronic pain   . Diabetes mellitus without complication ([place])   . Generalized anxiety disorder   . Headache   .  Hypertension   . Schizoaffective disorder ([place])      Family History  Problem Relation Age of Onset  . Diabetes Mother   . Cancer Mother  unsure what type  . Heart disease Mother   . Hypertension Mother   . Dementia Father   . Diabetes Brother   . Dementia Maternal Grandfather   . Cancer Paternal Grandmother 14       breast  . Mental illness Neg Hx      Social History   Socioeconomic History  . Marital status: Married    Spouse name: Not on file  . Number of children: Not on file  . Years of education: Not on file  . Highest education level: Not on file  Occupational History  . Not on file  Tobacco Use  . Smoking status: Current Every Day Smoker    Packs/day: 2.00    Types: Cigarettes  . Smokeless tobacco: Never Used  Vaping Use  . Vaping Use: Never used  Substance and Sexual Activity  . Alcohol use: Not Currently  . Drug use: Yes    Types: Marijuana    Comment: "have changed to hemp now" 09/17/17  . Sexual activity: Not on file  Other Topics Concern  . Not on file  Social History Narrative  . Not on file   Social Determinants of Health   Financial Resource Strain: Not on file  Food Insecurity: Not on file  Transportation Needs: Not on file  Physical Activity: Not on file  Stress: Not on file  Social Connections: Not on file  Intimate Partner Violence: Not on file     Allergies  Allergen Reactions  . Metformin And Related Diarrhea and Nausea And Vomiting     Outpatient Medications Prior to Visit  Medication Sig Dispense Refill  . Accu-Chek FastClix Lancets MISC Use to check blood sugars three times per day 100 each 11  . blood glucose meter kit and supplies KIT Dispense based on patient and insurance preference. Use up to four times daily as directed. ICD-10 E11.65  Z79.4 1 each 0  . blood glucose meter kit and supplies Dispense based on patient and insurance preference. Use up to four times daily as directed. (FOR ICD-10 E10.9, E11.9). 1 each 0   . Blood Glucose Monitoring Suppl (ACCU-CHEK GUIDE ME) w/Device KIT Use to check blood sugars up to 3 times per day. ICD-10 E11.65, Z79.4 1 kit 0  . diclofenac (VOLTAREN) 50 MG EC tablet TAKE 1 TABLET( 50 MG TOTAL) BY MOUTH 2 TIMES DAILY AS NEEDED FOR MODERATE PAIN, EAT BEFORE TAKING MEDICATION 60 tablet 1  . divalproex (DEPAKOTE ER) 500 MG 24 hr tablet Take 3 tablets (1,500 mg total) by mouth at bedtime. 90 tablet 11  . Dulaglutide (TRULICITY) 1.5 QT/6.2UQ SOPN Inject 1.5 mg into the skin once a week. 2 mL 2  . gabapentin (NEURONTIN) 300 MG capsule Take one pill twice per day and two at bedtime 360 capsule 1  . glucose blood (ACCU-CHEK GUIDE) test strip Use as instructed to check blood sugars three times per day. ICD-10 E11.65 and Z79.4 100 each 11  . guaiFENesin-codeine 100-10 MG/5ML syrup Take 5 mLs by mouth 3 (three) times daily as needed for cough. 120 mL 0  . JARDIANCE 25 MG TABS tablet Take 25 mg by mouth daily.    . Lancets Misc. (ACCU-CHEK FASTCLIX LANCET) KIT Use to check blood sugars 3 times per day 1 kit 11  . paliperidone (INVEGA SUSTENNA) 156 MG/ML SUSY injection Inject 1 mL (156 mg total) into the muscle once for 1 dose. Due 5/1 1 mL 11  . Respiratory Therapy Supplies (FLUTTER) DEVI Use with 4  times daily 1 each 0  . Spacer/Aero-Holding Chambers (AEROCHAMBER MV) inhaler Use as instructed 1 each 0  . traMADol (ULTRAM) 50 MG tablet Take 1 tablet (50 mg total) by mouth every 6 (six) hours as needed for moderate pain or severe pain. 120 tablet 3  . VENTOLIN HFA 108 (90 Base) MCG/ACT inhaler Inhale 2 puffs into the lungs every 4 (four) hours as needed for wheezing or shortness of breath. 18 g 3  . ipratropium-albuterol (DUONEB) 0.5-2.5 (3) MG/3ML SOLN Take 3 mLs by nebulization every 6 (six) hours. 360 mL 0  . valsartan (DIOVAN) 80 MG tablet Take 1 tablet (80 mg total) by mouth daily. 90 tablet 0  . Insulin Glargine (BASAGLAR KWIKPEN) 100 UNIT/ML Inject 20 Units into the skin daily.  (Patient not taking: Reported on 07/15/2020) 6 mL 2  . Insulin Pen Needle (TRUEPLUS PEN NEEDLES) 32G X 4 MM MISC Use as directed to inject insulin (Patient not taking: Reported on 07/15/2020) 100 each 11  . rizatriptan (MAXALT) 10 MG tablet Take 1 tablet (10 mg total) by mouth as needed for migraine. May repeat in 2 hours if needed (Patient not taking: Reported on 07/15/2020) 10 tablet 3   No facility-administered medications prior to visit.    Review of Systems  Constitutional: Negative for appetite change, fever and malaise/fatigue.  HENT: Positive for rhinorrhea. Negative for hoarse voice, postnasal drip, sneezing, sore throat and trouble swallowing.   Respiratory: Positive for cough, sputum production, shortness of breath and wheezing. Negative for hemoptysis.   Cardiovascular: Positive for dyspnea on exertion. Negative for chest pain and PND.  Gastrointestinal: Negative for heartburn.  Musculoskeletal: Negative for myalgias.  Neurological: Positive for headaches.       Objective:   Physical Exam   Vitals:   07/15/20 1347  BP: 107/64  Pulse: 84  Resp: 16  SpO2: 93%  Weight: 246 lb 12.8 oz (111.9 kg)    Gen: Pleasant, well-nourished, in no distress,  normal affect  ENT: No lesions,  mouth clear,  oropharynx clear, no postnasal drip  Neck: No JVD, no TMG, no carotid bruits  Lungs: No use of accessory muscles, no dullness to percussion, i distant breath sounds   cardiovascular: RRR, heart sounds normal, no murmur or gallops, no peripheral edema  Abdomen: soft and NT, no HSM,  BS normal  Musculoskeletal: No deformities, no cyanosis or clubbing  Neuro: alert, non focal  Skin: Warm, no lesions or rashes      Assessment & Plan:  I personally reviewed all images and lab data in the [facility] system as well as any outside material available during this office visit and agree with the  radiology impressions.   HTN (hypertension) Blood pressure at goal no change in medications  continue the valsartan 80 mg dose  Migraine headache without aura Switch to Imitrex 50 mg every 2 hours as needed for migraine may repeat with twice  COPD with chronic bronchitis ([place]) Continue inhaled medications as prescribed  Type 2 diabetes mellitus with hyperglycemia ([place]) Hold insulin glargine for now and continue Trulicity and Jardiance  Chronic pain syndrome Has pain contract will refill tramadol [place] drug database has been checked frequently  Tobacco use    . Current smoking consumption amount: 1 pack a day  . Dicsussion on advise to quit smoking and smoking impacts: Impacts on lung health  . Patient's willingness to quit: Not willing to completely quit  . Methods to quit smoking discussed: Behavioral modification  . Medication  management of smoking session drugs discussed: Has tried and failed nicotine replacement  . Resources provided:  AVS   . Setting quit date not yet established  . Follow-up arranged 3 weeks   Time spent counseling the patient: 5 minutes    Schizoaffective disorder ([place]) Continue Dequincy Born was seen today for diabetes and hypertension.  Diagnoses and all orders for this visit:  Type 2 diabetes mellitus with hyperglycemia, with long-term current use of insulin (HCC) -     POCT glucose (manual entry)  Primary hypertension  Migraine without aura and without status migrainosus, not intractable  COPD with chronic bronchitis (HCC)  Chronic pain syndrome  Tobacco use  Schizoaffective disorder, bipolar type ([place])  Other orders -     valsartan (DIOVAN) 80 MG tablet; Take 1 tablet (80 mg total) by mouth daily. -     ipratropium-albuterol (DUONEB) 0.5-2.5 (3) MG/3ML SOLN; Take 3 mLs by nebulization every 6 (six) hours. -     SUMAtriptan (IMITREX) 50 MG tablet; Take 1 tablet (50 mg total) by mouth every 2 (two) hours as needed for migraine. May repeat in 2 hours if headache persists or recurs. -     Pneumococcal  polysaccharide vaccine 23-valent greater than or equal to 2yo subcutaneous/IM  Patient received his third Covid vaccine in the home environment Pneumovax 23 valent given this visit

## 2020-07-15 NOTE — Patient Instructions (Addendum)
Stop insulin Basaglar Lantus Stay on Trulicity and Jardiance  Begin Imitrex take as needed for migraines  A Pneumovax 23 valent vaccine was given  Please process your Cologuard kit and turn it in  Please obtain an eye exam this year for your diabetes  Your nebulizer medicine refill was [facility] to [facility]  Please minimize the amount of tobacco use you are taking in at this time  Return to Dr. Joya Gaskins 2 months, the next visit will be a video virtual visit MyCHART   Pneumococcal Polysaccharide Vaccine (PPSV23): What You Need to Know 1. Why get vaccinated? Pneumococcal polysaccharide vaccine (PPSV23) can prevent pneumococcal disease. Pneumococcal disease refers to any illness caused by pneumococcal bacteria. These bacteria can cause many types of illnesses, including pneumonia, which is an infection of the lungs. Pneumococcal bacteria are one of the most common causes of pneumonia. Besides pneumonia, pneumococcal bacteria can also cause:  Ear infections  Sinus infections  Meningitis (infection of the tissue covering the brain and spinal cord)  Bacteremia (bloodstream infection) Anyone can get pneumococcal disease, but children under 68 years of age, people with certain medical conditions, adults 61 years or older, and cigarette smokers are at the highest risk. Most pneumococcal infections are mild. However, some can result in long-term problems, such as brain damage or hearing loss. Meningitis, bacteremia, and pneumonia caused by pneumococcal disease can be fatal. 2. PPSV23 PPSV23 protects against 23 types of bacteria that cause pneumococcal disease. PPSV23 is recommended for:  All adults 56 years or older,  Anyone 2 years or older with certain medical conditions that can lead to an increased risk for pneumococcal disease. Most people need only one dose of PPSV23. A second dose of PPSV23, and another type of pneumococcal vaccine called PCV13, are recommended for  certain high-risk groups. Your health care provider can give you more information. People 65 years or older should get a dose of PPSV23 even if they have already gotten one or more doses of the vaccine before they turned 53. 3. Talk with your health care provider Tell your vaccine provider if the person getting the vaccine:  Has had an allergic reaction after a previous dose of PPSV23, or has any severe, life-threatening allergies. In some cases, your health care provider may decide to postpone PPSV23 vaccination to a future visit. People with minor illnesses, such as a cold, may be vaccinated. People who are moderately or severely ill should usually wait until they recover before getting PPSV23. Your health care provider can give you more information. 4. Risks of a vaccine reaction  Redness or pain where the shot is given, feeling tired, fever, or muscle aches can happen after PPSV23. People sometimes faint after medical procedures, including vaccination. Tell your provider if you feel dizzy or have vision changes or ringing in the ears. As with any medicine, there is a very remote chance of a vaccine causing a severe allergic reaction, other serious injury, or death. 5. What if there is a serious problem? An allergic reaction could occur after the vaccinated person leaves the clinic. If you see signs of a severe allergic reaction (hives, swelling of the face and throat, difficulty breathing, a fast heartbeat, dizziness, or weakness), call 9-1-1 and get the person to the nearest hospital. For other signs that concern you, call your health care provider. Adverse reactions should be reported to the Vaccine Adverse Event Reporting System (VAERS). Your health care provider will usually file this report, or you can do  it yourself. Visit the VAERS website at www.vaers.SamedayNews.es or call (781)048-4364. VAERS is only for reporting reactions, and VAERS staff do not give medical advice. 6. How can I learn  more?  Ask your health care provider.  Call your local or state health department.  Contact the Centers for Disease Control and Prevention (CDC): ? Call 608-595-8607 (1-800-CDC-INFO) or ? Visit CDC's website at http://hunter.com/ Vaccine Information Statement PPSV23 Vaccine (02/22/2018) This information is not intended to replace advice given to you by your health care provider. Make sure you discuss any questions you have with your health care provider. Document Revised: 12/14/2019 Document Reviewed: 12/14/2019 Elsevier Patient Education  [employer].

## 2020-07-15 NOTE — Assessment & Plan Note (Signed)
Continue Joshua [name]

## 2020-07-15 NOTE — Telephone Encounter (Signed)
Pts wife was confused about the Pts blood pressure meds and had some questions.  Caller was asking about valsartan (DIOVAN) 80 MG tablet and what it is used for. Also had some questions regarding JARDIANCE 25 MG TABS tablet     Called patient and wife. No answer , left message on voicemail to call back to clinic.

## 2020-07-15 NOTE — Assessment & Plan Note (Addendum)
Hold insulin glargine for now and continue Trulicity and [place]

## 2020-07-16 ENCOUNTER — Ambulatory Visit: Payer: Medicare Other | Admitting: Pharmacist

## 2020-07-17 ENCOUNTER — Telehealth: Payer: Self-pay | Admitting: Pharmacist

## 2020-07-17 NOTE — Telephone Encounter (Signed)
Call returned to patient with no answer. Left HIPAA-compliant VM with instructions to return call.

## 2020-07-24 ENCOUNTER — Other Ambulatory Visit: Payer: Self-pay

## 2020-07-24 ENCOUNTER — Ambulatory Visit: Payer: Medicare Other | Admitting: Pharmacist

## 2020-07-24 ENCOUNTER — Ambulatory Visit: Payer: Medicare Other | Attending: Critical Care Medicine | Admitting: Pharmacist

## 2020-07-24 DIAGNOSIS — E1165 Type 2 diabetes mellitus with hyperglycemia: Secondary | ICD-10-CM

## 2020-07-24 DIAGNOSIS — Z794 Long term (current) use of insulin: Secondary | ICD-10-CM

## 2020-07-24 NOTE — Progress Notes (Signed)
Virtual Visit via Telephone Note  I connected withDonald [name] on 07/24/20 at 2:30 PM EDT by telephoneand verified that I am speaking with the correct person using two identifiers.  Location: Patient: home Provider: [facility] office Seen with Joshua [name], PharmD Candidate  I discussed the limitations, risks, security and privacy concerns of performing an evaluation and management service by telephone and the availability of in person appointments. I also discussed with the patient that there may be a patient responsible charge related to this service. The patient expressed understanding and agreed to proceed.  S:    PCP: Dr. Joya Gaskins   Patient arrives in good spirits. Presents for diabetes management at the request of Dr. Joya Gaskins. Patient was referred on 06/02/2020. Last seen by CPP on 07/08/20 and at that visit Trulicity was increased to 1.5 mg weekly. Last seen by Dr. Joya Gaskins on 07/15/20, at that visit Basaglar was discontinued and Jardiance was added.  Today, medication adherence reported having not missed any doses since last seeing CPP. Pt reports that his HA has resolved since Dr. Joya Gaskins discontinued his insulin and said he was "feeling pretty good" today, but reports he had a dizzy spell a few days ago. Reports that he is tolerating new Trulicity dose well. Denies N/V, GI upset.  Family/Social History:  - FHx: DM (mother, brother), heart disease (father), HTN (father) - Tobacco: current every day smoker (2 packs per day) - Alcohol: denies  - Other substances: endorses daily use of marijuana   Insurance coverage/medication affordability:  - Medicare   Patient reports adherence with medications.   Current diabetes medications include:  - Jardiance 25 mg daily - Trulicity 1.5 mg weekly   Current hypertension medications include:  - Valsartan 80 mg daily  Patient denies hypoglycemic events.  Patient reported dietary habits:  - Eats 1 meal/day (supper) secondary to poor  appetite throughout the day - Reports eating a large supper later at night before bed - Non-compliant with diet: endorses high intake of carbs at supper (lists potato chips as an example)  Patient-reported exercise habits:  - None   Patient denies polyuria, polydipsia.  Patient denies baseline neuropathy. Patient reports visual changes. Patient reports self foot exams.   O:  CBGs: 110s-120s (fasting AM readings) 160s, 170s, 180s at night (fasting - does not eat consistently)  Lab Results  Component Value Date   HGBA1C 10.4 (A) 06/02/2020   There were no vitals filed for this visit. Lipid Panel     Component Value Date/Time   CHOL 144 07/20/2019 1116   TRIG 246 (H) 07/20/2019 1116   HDL 32 (L) 07/20/2019 1116   CHOLHDL 4.5 07/20/2019 1116   CHOLHDL 3.3 09/30/2016 0651   VLDL 35 09/30/2016 0651   LDLCALC 72 07/20/2019 1116   Clinical ASCVD: No  The ASCVD Risk score Mikey Bussing DC Jr., et al., 2013) failed to calculate for the following reasons:   The valid total cholesterol range is 130 to 320 mg/dL   A/P: Diabetes longstanding currently uncontrolled but sugars are improving. Patient is able to verbalize appropriate hypoglycemia management plan. Patient is adherent with medication. Control is suboptimal due to dietary indiscretion and physical inactivity. BGs are greatly improved. No changes today since home readings are near goal. Pt appears to be tolerating insulin discontinuation well since BGs are near goal. -Continue Jardiance 25 mg daily -Continue Trulicity 1.5 mg weekly on Mondays -Extensively discussed pathophysiology of DM, recommended lifestyle interventions, dietary effects on glycemic control -Counseled on s/sx of and  management of hypoglycemia -Next A1C anticipated 08/2020  HTN:  HTN longstanding currently above goal. BP < 130/80 mmHg. No BP taken today due to televisit. Continued to counsel on lifestyle and diet modifications. -Continued valsartan 80 mg  daily  Written patient instructions provided. Total time in face to face counseling 30 minutes.    Benard Halsted, PharmD, Para [place], [place], PharmD Candidate [employer] Class of 2024

## 2020-07-26 ENCOUNTER — Other Ambulatory Visit: Payer: Self-pay

## 2020-07-27 ENCOUNTER — Other Ambulatory Visit: Payer: Self-pay | Admitting: Family Medicine

## 2020-07-27 DIAGNOSIS — G8929 Other chronic pain: Secondary | ICD-10-CM

## 2020-07-27 DIAGNOSIS — M5442 Lumbago with sciatica, left side: Secondary | ICD-10-CM

## 2020-07-28 ENCOUNTER — Other Ambulatory Visit: Payer: Self-pay

## 2020-07-28 MED FILL — Empagliflozin Tab 25 MG: ORAL | 30 days supply | Qty: 30 | Fill #0 | Status: AC

## 2020-08-01 ENCOUNTER — Other Ambulatory Visit: Payer: Self-pay

## 2020-08-04 DIAGNOSIS — F17200 Nicotine dependence, unspecified, uncomplicated: Secondary | ICD-10-CM | POA: Diagnosis not present

## 2020-08-04 DIAGNOSIS — F250 Schizoaffective disorder, bipolar type: Secondary | ICD-10-CM | POA: Diagnosis not present

## 2020-08-05 ENCOUNTER — Other Ambulatory Visit: Payer: Self-pay | Admitting: Critical Care Medicine

## 2020-08-05 DIAGNOSIS — G8929 Other chronic pain: Secondary | ICD-10-CM

## 2020-08-05 DIAGNOSIS — M5442 Lumbago with sciatica, left side: Secondary | ICD-10-CM

## 2020-08-05 MED ORDER — GABAPENTIN 300 MG PO CAPS: ORAL_CAPSULE | ORAL | 1 refills | Status: DC

## 2020-08-05 NOTE — Telephone Encounter (Signed)
Medication: gabapentin (NEURONTIN) 300 MG capsule Has the pt contacted their pharmacy? Yes but this last written by Dr Chapman Fitch  Preferred pharmacy: [facility] [phone] - [place], [place] AT [address]  Please be advised refills may take up to 3 business days.  We ask that you follow up with your pharmacy.

## 2020-08-05 NOTE — Telephone Encounter (Signed)
Future visit in 1 month  

## 2020-08-29 ENCOUNTER — Other Ambulatory Visit: Payer: Self-pay | Admitting: Critical Care Medicine

## 2020-08-29 MED ORDER — JARDIANCE 25 MG PO TABS: 25.0000 mg | ORAL_TABLET | Freq: Every day | ORAL | 2 refills | Status: DC

## 2020-08-29 NOTE — Telephone Encounter (Signed)
Requested medication (s) are due for refill today: yes  Requested medication (s) are on the active medication list: yes  Last refill:  07/15/20  Future visit scheduled: yes  Notes to clinic:  historical provider   Requested Prescriptions  Pending Prescriptions Disp Refills   JARDIANCE 25 MG TABS tablet 30 tablet 0    Sig: Take 1 tablet (25 mg total) by mouth daily.      Endocrinology:  Diabetes - SGLT2 Inhibitors Failed - 08/29/2020 10:40 AM      Failed - LDL in normal range and within 360 days    LDL Chol Calc (NIH)  Date Value Ref Range Status  07/20/2019 72 0 - 99 mg/dL Final          Failed - HBA1C is between 0 and 7.9 and within 180 days    HbA1c, POC (controlled diabetic range)  Date Value Ref Range Status  06/02/2020 10.4 (A) 0.0 - 7.0 % Final          Passed - Cr in normal range and within 360 days    Creatinine  Date Value Ref Range Status  09/18/2019 119.5 20.0 - 300.0 mg/dL Final   Creatinine, Ser  Date Value Ref Range Status  03/03/2020 1.26 0.76 - 1.27 mg/dL Final          Passed - eGFR in normal range and within 360 days    GFR calc Af Amer  Date Value Ref Range Status  03/03/2020 70 >59 mL/min/1.73 Final    Comment:    **In accordance with recommendations from the NKF-ASN Task force,**   [facility] is in the process of updating its eGFR calculation to the   2021 CKD-EPI creatinine equation that estimates kidney function   without a race variable.    GFR, Estimated  Date Value Ref Range Status  02/18/2020 >60 >60 mL/min Final    Comment:    (NOTE) Calculated using the CKD-EPI Creatinine Equation (2021)    GFR calc non Af Amer  Date Value Ref Range Status  03/03/2020 61 >59 mL/min/1.73 Final          Passed - Valid encounter within last 6 months    Recent Outpatient Visits           1 month ago Type 2 diabetes mellitus with hyperglycemia, with long-term current use of insulin [facility])   [place],  Annie Main L, RPH-CPP   1 month ago Type 2 diabetes mellitus with hyperglycemia, with long-term current use of insulin ([place] [facility])   [place] Elsie Stain, MD   1 month ago Type 2 diabetes mellitus with hyperglycemia, with long-term current use of insulin [facility])   [place], Annie Main L, RPH-CPP   2 months ago Type 2 diabetes mellitus with hyperglycemia, with long-term current use of insulin [facility])   [place], Jarome Matin, RPH-CPP   2 months ago Type 2 diabetes mellitus with hyperglycemia, with long-term current use of insulin [facility])   [place], MD       Future Appointments             In 2 weeks Elsie Stain, MD [place]

## 2020-08-29 NOTE — Telephone Encounter (Signed)
Pt's wife called and requested for the pt's Rx to be rerouted because they are unable to speak to the [facility] pharmacy.   Pt is completely out today, took his last pill this morning.   Requesting empagliflozin (JARDIANCE) 25 MG TABS tablet   [id number] - [place], [address]  [place] [address] [place] [address]  Phone: [phone] Fax: [phone]

## 2020-09-04 DIAGNOSIS — H31002 Unspecified chorioretinal scars, left eye: Secondary | ICD-10-CM | POA: Diagnosis not present

## 2020-09-04 DIAGNOSIS — E119 Type 2 diabetes mellitus without complications: Secondary | ICD-10-CM | POA: Diagnosis not present

## 2020-09-04 DIAGNOSIS — H52203 Unspecified astigmatism, bilateral: Secondary | ICD-10-CM | POA: Diagnosis not present

## 2020-09-04 DIAGNOSIS — H2513 Age-related nuclear cataract, bilateral: Secondary | ICD-10-CM | POA: Diagnosis not present

## 2020-09-04 LAB — HM DIABETES EYE EXAM

## 2020-09-08 ENCOUNTER — Other Ambulatory Visit: Payer: Self-pay | Admitting: Critical Care Medicine

## 2020-09-14 NOTE — Progress Notes (Signed)
Subjective:    Patient ID: Joshua [name], male    DOB: October 14, 1955, 65 y.o.   MRN: [id number]   Virtual Visit via Video Note  I connected with@ on 09/15/20 at@ by a video enabled telemedicine application and verified that I am speaking with the correct person using two identifiers.   Consent:  I discussed the limitations, risks, security and privacy concerns of performing an evaluation and management service by video visit and the availability of in person appointments. I also discussed with the patient that there may be a patient responsible charge related to this service. The patient expressed understanding and agreed to proceed.  Location of patient: Patient's at home  Location of provider: I am in my office  Persons participating in the televisit with the patient.   No one else on the call  History of Present Illness: Today12/1/20 This is a video telehealth visit for a patient who has underlying COPD and was needing further follow-up upon referral from the patient's primary care provider.  The patient's been on several rounds of prednisone antibiotics with only improvement while on prednisone.  He states he is short of breath at rest and with exertion.  He has had problems with breathing for many years got worse over the past year.  He also has underlying diabetes on Trulicity and insulin long-acting.  He also states he has chronic low back pain and has difficulty ambulating this affects his breathing as well.  His weight is gone up as well which is aggravated his chronic pain.  He had a pain management doctor but has not seen that doctor since November 2019.  The patient is on tramadol from an urgent care visit he states this is helped to some degree.  He states nonsteroidals and gabapentin have not been of use in the past.  He also states that he has increased fluid retention and is questioning whether his losartan can be adjusted to accommodate reduction in fluid retention.  He  still smokes 2 pack a day of cigarettes and previous smoked as much as 4 packs a day.  He has not had a pulmonary function test.  Chest x-ray in July of this year showed no active disease.  The patient does have a Symbicort inhaler and is using this however only on an as needed basis along with albuterol which is as needed  Note when I tried to do a medication reconciliation the patient was not clear on which medicines he was taking and which ones he had stopped and he has a list of nearly 40 medicines in his E HR.  04/10/2019 This is a follow-up visit from a video visit that occurred recently.  The patient was given doxycycline and pulse prednisone for COPD exacerbation and is maintaining Symbicort daily.  The patient was referred for the COPD evaluation.  I also refill the patient's tramadol at the last visit.  He has pending pulmonary function test which have yet to be obtained.  He does state that his back is improved on the tramadol.  He also states that he has broken teeth and they will bite into his gum and mouth causing pain in the mouth.  The tramadol helps to some degree but is requesting a mouthwash.  He has not seen a dentist recently.  Patient is still smoking 2 packs a day of cigarettes.  The patient did bring all his current active medications in with him today and I did a complete medication reconciliation see  is current new medication list  04/17/2019 Video visit request re dyspnea Copd  This patient was last seen in person on December 15 and since that time the patient has had recurrent exacerbation of COPD.  He has been using his Symbicort daily.  He states he awoke with acute shortness of breath and lung congestion.  This occurred 2 days ago.  He also had increased pain in the right side.  He has been coughing up profuse thick yellow mucus.  He is using the flutter valve and the nebulizer which helps to remove some of the mucus.  His mouth pain is improved with the [facility]  mouthwash and would like a refill.  He is also out of his tramadol and is yet to achieve a chronic pain management appointment.  There is no fever.  Note he just had a negative Covid test December 18 in preparation for pulmonary function studies which have yet to be scheduled  1/18: This is a follow-up from previous visit in December that was a video visit.  The patient did receive a round of Levaquin and prednisone and he improved to some degree.  He only has albuterol for his nebulizer and is about to run out.  The patient worsened since he ran out of prednisone and the antibiotic.  He still has a lot of phlegm that is difficult to raise.  He is not using his flutter valve every day.  He continues to smoke 1 pack a day of cigarettes but previously was at 2 packs a day The patient notes pressure in the frontal and sinus area and he denies any ear pain  Note his GAD-7 score is 6 and PHQ-9 score is 9 today  He states he cannot afford the co-pay at this time of the year with his insurance.  He has run out of insulin for the past 10 days and would prefer to have his medications filled at our pharmacy  The patient also had a referral to [facility] center but they declined his case.  Patient's been taking 50 mg 3 times daily of the tramadol  06/28/2019 Telephone visit: Since the last visit the patient states he still having shortness of breath but his cough and mucus production as well as breathing are better since taking the course of antibiotics and prednisone.  He is now down to 1 pack a day from 3 packs a day of cigarettes.  He states he still has no energy.  He states his lower back pain is under reasonable control with tramadol.  The patient is on Trulicity and insulin glargine for his diabetes but has run out of testing strips.  He also wishes a refill on his Maxalt for his migraines.  The patient maintains his inhaled medications without changes.  09/18/2019 Patient seen in return follow-up  and his shortness of breath is much improved.  He is coughing less.  He is smoking less.  He has no new complaints other than chronic back pain  11/20/2019 This visit is a video telehealth visit for this patient who has chronic lung disease, hypertension, peripheral vascular disease, type 2 diabetes, chronic low back pain.  The patient states last several weeks and been difficult with change in weather being very hot and humid.  The patient notes he ha s had chest congestion cough and more shortness of breath.  Patient's not been using his Advair on a regular basis.  He is about out of tramadol will need refills soon.  He is requesting a refill on his Maxalt.  He also would like mouthwash because his throat is irritated again.  He is yet to receive the Covid vaccine and is interested in this.  Patient states he is no longer on Basaglar insulin he is on Trulicity and Amaryl  The patient needs more glucose testing supplies  02/06/2020 telehealth visit for copd The patient can only connect by phone today.  He states he is having increased wheezing productive cough.  He is still smoking 1 to 2 cigarettes a day is trying to reduce this.  He would like a flu vaccine.  He did receive his Covid vaccine series.  He has some headaches and the mucus is yellow.  See a COPD assessment below  03/06/2020 PCP patient of dr fulp   Here for Copd f/u Patient states his shortness of breath continues.  He was just seen by primary care and given a course of doxycycline he just started on the ninth.  He had yellow-green mucus notes clear but thick foamy.  He is still short of breath minimal exertion.  He states prednisone causes his sugars to go up and he is 275 on arrival today with his glucose he has not been taking his Amaryl regularly.  He states prednisone causes side effects.  He still smoking a pack a day of cigarettes.  See shortness of breath assessment below  The patient states he is unable to take a deep  breath with the Advair inhaler at this time    04/02/2020 Copd f/u and needs new PCP former [facility].  HTN, Copd, Migraine, PAD, chronic pain, T2DM, DJD, GAD, Schizoaffective, tobacco, spinal stenosis.   On Tramadol chronically , has pain contract  This is a telephone visit for this patient with underlying COPD chronic pain here for COPD follow-up.  He states he now has a home nebulizer doing well on the DuoNeb 4 times daily he is still however coughing up some yellow to white mucus with increased wheeze  His chronic pain is being well managed he has no other complaints  06/02/2020 Patient seen in return follow-up states his breathing is at baseline feels somewhat weak has not been taking in fluids much on arrival blood pressure is 81/60 pulse 120 blood glucose 403  Patient has been skipping some of his doses of medications for his diabetes  No other complaints  07/15/20 Patient seen return follow-up complains of ongoing migraine headache states current migraine medicine ineffective  His headaches were worse than when he was on the insulin glargine his blood sugar got down to 85-100 and with this though it was normal cause headaches when he stopped the insulin and his sugars ran in the 1 20-1 30 range the headaches went away  Patient is maintaining his Trulicity Patient needs refills on his nebulized medication  5/23 Patient states overall he is doing better.  His migraine headaches are under good control his breathing is improved although he simply needs his refills on his medications. Patient is taking his migraine medicines he is compliant with his diabetic control and inhalers and nebulized therapy.  His low back pain is stable as well at this time.  COPD He complains of sputum production. There is no chest tightness, cough, difficulty breathing, frequent throat clearing, hemoptysis, hoarse voice, shortness of breath or wheezing. This is a chronic problem. The current episode started  more than 1 year ago. The problem occurs constantly. The problem has been rapidly improving. The cough  is productive of sputum and productive of purulent sputum. Associated symptoms include dyspnea on exertion, nasal congestion and orthopnea. Pertinent negatives include no appetite change, chest pain, ear congestion, fever, headaches, heartburn, malaise/fatigue, myalgias, PND, postnasal drip, rhinorrhea, sneezing, sore throat, sweats or trouble swallowing. His symptoms are aggravated by any activity, change in weather, climbing stairs, lying down, exposure to fumes, strenuous activity, emotional stress and minimal activity. His symptoms are alleviated by beta-agonist, oral steroids and steroid inhaler. Risk factors for lung disease include smoking/tobacco exposure. His past medical history is significant for COPD.   Past Medical History:  Diagnosis Date  . Back pain   . Bipolar disorder ([place])   . Cannabis use disorder, moderate, dependence ([place])   . Chronic pain   . Diabetes mellitus without complication ([place])   . Generalized anxiety disorder   . Headache   . Hypertension   . Schizoaffective disorder ([place])      Family History  Problem Relation Age of Onset  . Diabetes Mother   . Cancer Mother        unsure what type  . Heart disease Mother   . Hypertension Mother   . Dementia Father   . Diabetes Brother   . Dementia Maternal Grandfather   . Cancer Paternal Grandmother 75       breast  . Mental illness Neg Hx      Social History   Socioeconomic History  . Marital status: Married    Spouse name: Not on file  . Number of children: Not on file  . Years of education: Not on file  . Highest education level: Not on file  Occupational History  . Not on file  Tobacco Use  . Smoking status: Current Every Day Smoker    Packs/day: 2.00    Types: Cigarettes  . Smokeless tobacco: Never Used  Vaping Use  . Vaping Use: Never used  Substance and Sexual Activity  . Alcohol use: Not  Currently  . Drug use: Yes    Types: Marijuana    Comment: "have changed to hemp now" 09/17/17  . Sexual activity: Not on file  Other Topics Concern  . Not on file  Social History Narrative  . Not on file   Social Determinants of Health   Financial Resource Strain: Not on file  Food Insecurity: Not on file  Transportation Needs: Not on file  Physical Activity: Not on file  Stress: Not on file  Social Connections: Not on file  Intimate Partner Violence: Not on file     Allergies  Allergen Reactions  . Metformin And Related Diarrhea and Nausea And Vomiting     Outpatient Medications Prior to Visit  Medication Sig Dispense Refill  . Accu-Chek FastClix Lancets MISC Use to check blood sugars three times per day 100 each 11  . blood glucose meter kit and supplies KIT Dispense based on patient and insurance preference. Use up to four times daily as directed. ICD-10 E11.65  Z79.4 1 each 0  . blood glucose meter kit and supplies Dispense based on patient and insurance preference. Use up to four times daily as directed. (FOR ICD-10 E10.9, E11.9). 1 each 0  . Blood Glucose Monitoring Suppl (ACCU-CHEK GUIDE ME) w/Device KIT Use to check blood sugars up to 3 times per day. ICD-10 E11.65, Z79.4 1 kit 0  . divalproex (DEPAKOTE ER) 500 MG 24 hr tablet Take 3 tablets (1,500 mg total) by mouth at bedtime. 90 tablet 11  . glucose blood (ACCU-CHEK  GUIDE) test strip Use as instructed to check blood sugars three times per day. ICD-10 E11.65 and Z79.4 100 each 11  . Lancets Misc. (ACCU-CHEK FASTCLIX LANCET) KIT Use to check blood sugars 3 times per day 1 kit 11  . Respiratory Therapy Supplies (FLUTTER) DEVI Use with 4 times daily 1 each 0  . Spacer/Aero-Holding Chambers (AEROCHAMBER MV) inhaler Use as instructed 1 each 0  . VENTOLIN HFA 108 (90 Base) MCG/ACT inhaler Inhale 2 puffs into the lungs every 4 (four) hours as needed for wheezing or shortness of breath. 18 g 3  . diclofenac (VOLTAREN) 50 MG EC  tablet TAKE 1 TABLET BY MOUTH TWICE DAILY AS NEEDED FOR MODERATE PAIN. EAT BEFORE TAKING MEDICATION 60 tablet 1  . Dulaglutide (TRULICITY) 1.5 HU/7.6LY SOPN Inject 1.5 mg into the skin once a week. 2 mL 2  . gabapentin (NEURONTIN) 300 MG capsule Take one pill twice per day and two at bedtime 360 capsule 1  . guaiFENesin-codeine 100-10 MG/5ML syrup Take 5 mLs by mouth 3 (three) times daily as needed for cough. 120 mL 0  . ipratropium-albuterol (DUONEB) 0.5-2.5 (3) MG/3ML SOLN TAKE 3 MLS BY NEBULIZATION EVERY 6 (SIX) HOURS. 360 mL 0  . JARDIANCE 25 MG TABS tablet Take 1 tablet (25 mg total) by mouth daily. 30 tablet 2  . SUMAtriptan (IMITREX) 50 MG tablet Take 1 tablet (50 mg total) by mouth every 2 (two) hours as needed for migraine. May repeat in 2 hours if headache persists or recurs. 20 tablet 0  . traMADol (ULTRAM) 50 MG tablet Take 1 tablet (50 mg total) by mouth every 6 (six) hours as needed for moderate pain or severe pain. 120 tablet 3  . valsartan (DIOVAN) 80 MG tablet Take 1 tablet (80 mg total) by mouth daily. 90 tablet 2  . paliperidone (INVEGA SUSTENNA) 156 MG/ML SUSY injection Inject 1 mL (156 mg total) into the muscle once for 1 dose. Due 5/1 1 mL 11  . rizatriptan (MAXALT) 10 MG tablet Take by mouth. (Patient not taking: Reported on 09/15/2020)     No facility-administered medications prior to visit.    Review of Systems  Constitutional: Negative for appetite change, fever and malaise/fatigue.  HENT: Negative for hoarse voice, postnasal drip, rhinorrhea, sneezing, sore throat and trouble swallowing.   Respiratory: Positive for sputum production. Negative for cough, hemoptysis, shortness of breath and wheezing.   Cardiovascular: Positive for dyspnea on exertion. Negative for chest pain and PND.  Gastrointestinal: Negative for heartburn.  Musculoskeletal: Negative for myalgias.  Neurological: Negative for headaches.       Objective:   Physical Exam   There were no vitals  filed for this visit. Patient was seen on video he was in no acute distress     Assessment & Plan:  I personally reviewed all images and lab data in the [facility] system as well as any outside material available during this office visit and agree with the  radiology impressions.   HTN (hypertension) No change in blood pressure medications refill sent  Migraine headache without aura Improved with change to Imitrex  COPD with chronic bronchitis (HCC) Continue scheduled nebulized therapy  Type 2 diabetes mellitus with hyperglycemia ([place]) Continue current diabetic program  Tobacco use Patient no longer smoking  Spinal stenosis of lumbar region Tramadol refilled   Diagnoses and all orders for this visit:  Chronic low back pain with left-sided sciatica, unspecified back pain laterality -     diclofenac (VOLTAREN) 50 MG  EC tablet; TAKE 1 TABLET BY MOUTH TWICE DAILY AS NEEDED FOR MODERATE PAIN. EAT BEFORE TAKING MEDICATION -     gabapentin (NEURONTIN) 300 MG capsule; Take one pill twice per day and two at bedtime  Low back pain with radiation -     traMADol (ULTRAM) 50 MG tablet; Take 1 tablet (50 mg total) by mouth every 6 (six) hours as needed for moderate pain or severe pain.  Chronic pain syndrome -     traMADol (ULTRAM) 50 MG tablet; Take 1 tablet (50 mg total) by mouth every 6 (six) hours as needed for moderate pain or severe pain.  Primary hypertension  Migraine without aura and without status migrainosus, not intractable  COPD with chronic bronchitis (HCC)  Type 2 diabetes mellitus with hyperglycemia, with long-term current use of insulin (HCC)  Tobacco use  Spinal stenosis of lumbar region with neurogenic claudication  Other orders -     Dulaglutide (TRULICITY) 1.5 OK/5.9XH SOPN; Inject 1.5 mg into the skin once a week. -     ipratropium-albuterol (DUONEB) 0.5-2.5 (3) MG/3ML SOLN; Take 3 mLs by nebulization every 6 (six) hours. -     JARDIANCE 25 MG TABS tablet; Take 1  tablet (25 mg total) by mouth daily. -     SUMAtriptan (IMITREX) 50 MG tablet; Take 1 tablet (50 mg total) by mouth every 2 (two) hours as needed for migraine. May repeat in 2 hours if headache persists or recurs. -     valsartan (DIOVAN) 80 MG tablet; Take 1 tablet (80 mg total) by mouth daily.     I spent 26 minutes on this visit through the video system reviewing records formulating complex plan refilling all medications Follow Up Instructions: Patient knows a face-to-face visit will occur in July Refills on all medications sent to his local pharmacy   I discussed the assessment and treatment plan with the patient. The patient was provided an opportunity to ask questions and all were answered. The patient agreed with the plan and demonstrated an understanding of the instructions.   The patient was advised to call back or seek an in-person evaluation if the symptoms worsen or if the condition fails to improve as anticipated.  I provided  minutes of non-face-to-face time during this encounter  including  median intraservice time , review of notes, labs, imaging, medications  and explaining diagnosis and management to the patient .    Asencion Noble, MD

## 2020-09-15 ENCOUNTER — Encounter: Payer: Self-pay | Admitting: Critical Care Medicine

## 2020-09-15 ENCOUNTER — Other Ambulatory Visit (HOSPITAL_COMMUNITY): Payer: Self-pay

## 2020-09-15 ENCOUNTER — Other Ambulatory Visit: Payer: Self-pay

## 2020-09-15 ENCOUNTER — Ambulatory Visit: Payer: Medicare Other | Attending: Critical Care Medicine | Admitting: Critical Care Medicine

## 2020-09-15 DIAGNOSIS — I10 Essential (primary) hypertension: Secondary | ICD-10-CM

## 2020-09-15 DIAGNOSIS — G894 Chronic pain syndrome: Secondary | ICD-10-CM

## 2020-09-15 DIAGNOSIS — E1165 Type 2 diabetes mellitus with hyperglycemia: Secondary | ICD-10-CM

## 2020-09-15 DIAGNOSIS — M5442 Lumbago with sciatica, left side: Secondary | ICD-10-CM | POA: Diagnosis not present

## 2020-09-15 DIAGNOSIS — J449 Chronic obstructive pulmonary disease, unspecified: Secondary | ICD-10-CM

## 2020-09-15 DIAGNOSIS — Z794 Long term (current) use of insulin: Secondary | ICD-10-CM

## 2020-09-15 DIAGNOSIS — F17210 Nicotine dependence, cigarettes, uncomplicated: Secondary | ICD-10-CM | POA: Diagnosis not present

## 2020-09-15 DIAGNOSIS — Z720 Tobacco use: Secondary | ICD-10-CM | POA: Diagnosis not present

## 2020-09-15 DIAGNOSIS — G8929 Other chronic pain: Secondary | ICD-10-CM

## 2020-09-15 DIAGNOSIS — Z7951 Long term (current) use of inhaled steroids: Secondary | ICD-10-CM | POA: Diagnosis not present

## 2020-09-15 DIAGNOSIS — Z79899 Other long term (current) drug therapy: Secondary | ICD-10-CM | POA: Insufficient documentation

## 2020-09-15 DIAGNOSIS — G43009 Migraine without aura, not intractable, without status migrainosus: Secondary | ICD-10-CM

## 2020-09-15 DIAGNOSIS — J4489 Other specified chronic obstructive pulmonary disease: Secondary | ICD-10-CM

## 2020-09-15 DIAGNOSIS — M545 Low back pain, unspecified: Secondary | ICD-10-CM

## 2020-09-15 DIAGNOSIS — M48062 Spinal stenosis, lumbar region with neurogenic claudication: Secondary | ICD-10-CM | POA: Diagnosis not present

## 2020-09-15 MED ORDER — SUMATRIPTAN SUCCINATE 50 MG PO TABS: 50.0000 mg | ORAL_TABLET | ORAL | 0 refills | Status: DC | PRN

## 2020-09-15 MED ORDER — GABAPENTIN 300 MG PO CAPS: ORAL_CAPSULE | ORAL | 1 refills | Status: DC

## 2020-09-15 MED ORDER — VALSARTAN 80 MG PO TABS: 80.0000 mg | ORAL_TABLET | Freq: Every day | ORAL | 2 refills | Status: DC

## 2020-09-15 MED ORDER — TRULICITY 1.5 MG/0.5ML ~~LOC~~ SOAJ: 1.5000 mg | SUBCUTANEOUS | 2 refills | Status: DC

## 2020-09-15 MED ORDER — TRAMADOL HCL 50 MG PO TABS: 50.0000 mg | ORAL_TABLET | Freq: Four times a day (QID) | ORAL | 1 refills | Status: DC | PRN

## 2020-09-15 MED ORDER — DICLOFENAC SODIUM 50 MG PO TBEC: DELAYED_RELEASE_TABLET | ORAL | 1 refills | Status: DC

## 2020-09-15 MED ORDER — JARDIANCE 25 MG PO TABS: 25.0000 mg | ORAL_TABLET | Freq: Every day | ORAL | 2 refills | Status: DC

## 2020-09-15 MED ORDER — IPRATROPIUM-ALBUTEROL 0.5-2.5 (3) MG/3ML IN SOLN
3.0000 mL | Freq: Four times a day (QID) | RESPIRATORY_TRACT | 0 refills | Status: DC
  Filled 2020-09-15: qty 360, 30d supply, fill #0

## 2020-09-15 NOTE — Assessment & Plan Note (Signed)
Tramadol refilled.

## 2020-09-15 NOTE — Assessment & Plan Note (Signed)
Patient no longer smoking

## 2020-09-15 NOTE — Assessment & Plan Note (Signed)
Continue scheduled nebulized therapy

## 2020-09-15 NOTE — Assessment & Plan Note (Signed)
Improved with change to Imitrex

## 2020-09-15 NOTE — Assessment & Plan Note (Signed)
No change in blood pressure medications refill sent

## 2020-09-15 NOTE — Assessment & Plan Note (Signed)
Continue current diabetic program

## 2020-09-18 ENCOUNTER — Other Ambulatory Visit (HOSPITAL_COMMUNITY): Payer: Self-pay

## 2020-10-21 ENCOUNTER — Telehealth: Payer: Self-pay | Admitting: Critical Care Medicine

## 2020-10-21 NOTE — Telephone Encounter (Signed)
Copied from [place] [id number]. Topic: General - Inquiry >> Oct 20, 2020 12:29 PM Loma [place] wrote: Reason for CRM: pt wants to get a message to dr Joya Gaskins to call him, states very important and Dr Joya Gaskins was expecting him to call, evasive fu [phone]

## 2020-10-21 NOTE — Telephone Encounter (Signed)
No answer after three tries this PM

## 2020-10-24 ENCOUNTER — Ambulatory Visit: Payer: Self-pay | Admitting: *Deleted

## 2020-10-24 NOTE — Telephone Encounter (Signed)
Reason for Disposition  [1] Face is swollen AND [2] no fever  Answer Assessment - Initial Assessment Questions 1. LOCATION: "Which tooth is hurting?"  (e.g., right-side/left-side, upper/lower, front/back)     Right side  2. ONSET: "When did the toothache start?"  (e.g., hours, days)      4 days ago  3. SEVERITY: "How bad is the toothache?"  (Scale 1-10; mild, moderate or severe)   - MILD (1-3): doesn't interfere with chewing    - MODERATE (4-7): interferes with chewing, interferes with normal activities, awakens from sleep     - SEVERE (8-10): unable to eat, unable to do any normal activities, excruciating pain        Unable to eat, interferes with sleep  4. SWELLING: "Is there any visible swelling of your face?"     Yes right side of face and now right eye  5. OTHER SYMPTOMS: "Do you have any other symptoms?" (e.g., fever)     Temp. 98.1. does c/o feeling hot at night and sweats when house is 60 degrees. 6. PREGNANCY: "Is there any chance you are pregnant?" "When was your last menstrual period?"     na  Protocols used: [facility]

## 2020-10-24 NOTE — Telephone Encounter (Signed)
Patient's wife and patient called to report right facial swelling and right eye swelling from abscessed teeth. C/o pain and swelling x 4 days. Patient has been in bed x 3 days due to pain and noted sweating and feeling hot. Temp today 98.1 per wife . Patient reports "terrible taste in mouth". No available appt today . Patient would like to hear from PCP because he "knows him better". Advised to go to [facility] or ED for facial swelling. Care advise given. Patient verbalized understanding of care advise and to call back or go to [facility] or ED. Patient would like to hear from PCP 1 st. Please advise . Called [facility] no answer and phone rang unable to leave message.

## 2020-10-25 ENCOUNTER — Ambulatory Visit (HOSPITAL_COMMUNITY)
Admission: EM | Admit: 2020-10-25 | Discharge: 2020-10-25 | Disposition: A | Discharge status: Home / Self Care | Payer: Medicare Other | Attending: Physician Assistant | Admitting: Physician Assistant

## 2020-10-25 ENCOUNTER — Other Ambulatory Visit: Payer: Self-pay

## 2020-10-25 ENCOUNTER — Encounter (HOSPITAL_COMMUNITY): Payer: Self-pay

## 2020-10-25 DIAGNOSIS — K0889 Other specified disorders of teeth and supporting structures: Secondary | ICD-10-CM | POA: Diagnosis not present

## 2020-10-25 DIAGNOSIS — K047 Periapical abscess without sinus: Secondary | ICD-10-CM

## 2020-10-25 MED ORDER — AMOXICILLIN-POT CLAVULANATE 875-125 MG PO TABS: 1.0000 | ORAL_TABLET | Freq: Two times a day (BID) | ORAL | 0 refills | Status: DC

## 2020-10-25 NOTE — ED Provider Notes (Signed)
[place]    CSN: [id number] Arrival date & time: 10/25/20  1717      History   Chief Complaint Chief Complaint  Patient presents with   Dental Pain    HPI Joshua [name] is a 65 y.o. male.   Patient presents today with a several day history of worsening dental pain.  He has a history of broken teeth and often gets recurrent dental infections.  He reports pain is rated 10 on a 0-10 pain scale, localized to right upper molars, described as throbbing, no aggravating relieving factors identified.  He has been taking tramadol prescribed for chronic back pain without improvement of symptoms.  He has been able to eat but only soft foods such as soup as result of pain.  He has not seen a dentist recently.  He is interested in establishing with a dentist as he knows that he needs extraction as well as other significant dental work.  He denies any swelling of his throat/mouth, shortness of breath, inability to swallow.   Past Medical History:  Diagnosis Date   Back pain    Bipolar disorder ([place])    Cannabis use disorder, moderate, dependence (HCC)    Chronic pain    Diabetes mellitus without complication ([place])    Generalized anxiety disorder    Headache    Hypertension    Schizoaffective disorder [facility])     Patient Active Problem List   Diagnosis Date Noted   Spinal stenosis of lumbar region 02/19/2020   Nicotine abuse 08/05/2019   Type 2 diabetes mellitus with hyperglycemia ([place]) 05/14/2019   Peripheral vascular disease due to secondary diabetes ([place]) 05/14/2019   Thrombocytopenia ([place]) 05/14/2019   Chronic low back pain with left-sided sciatica 05/14/2019   Tobacco use 05/14/2019   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 Authorizing Provider  amoxicillin-clavulanate (AUGMENTIN) 875-125 MG tablet Take 1 tablet by mouth every 12 (twelve) hours for 10 days. 10/25/20 11/04/20 Yes Malya Cirillo, Derry Skill, PA-C  Accu-Chek FastClix Lancets MISC Use to check blood sugars three times per day 03/03/20   Antony Blackbird, MD  blood glucose meter kit and supplies KIT Dispense based on patient and insurance preference. Use up to four times daily as directed. ICD-10 E11.65  Z79.4 07/20/19   Antony Blackbird, MD  blood glucose meter kit and supplies Dispense based on patient and insurance preference. Use up to four times daily as directed. (FOR ICD-10 E10.9, E11.9). 07/20/19   Fulp, Cammie, MD  Blood Glucose Monitoring Suppl (ACCU-CHEK GUIDE ME) w/Device KIT Use to check blood sugars up to 3 times per day. ICD-10 E11.65, Z79.4 03/03/20   Fulp, Cammie, MD  diclofenac (VOLTAREN) 50 MG EC tablet TAKE 1 TABLET BY MOUTH TWICE DAILY AS NEEDED FOR MODERATE PAIN. EAT BEFORE TAKING MEDICATION 09/15/20   Elsie Stain, MD  divalproex (DEPAKOTE ER) 500 MG 24 hr tablet Take 3 tablets (1,500 mg total) by mouth at bedtime. 07/27/18   [name],  MD  Dulaglutide (TRULICITY) 1.5 VO/5.3GU SOPN Inject 1.5 mg into the skin once a week. 09/15/20   Elsie Stain, MD  gabapentin (NEURONTIN) 300 MG capsule Take one pill twice per day and two at bedtime 09/15/20   Elsie Stain, MD  glucose blood (ACCU-CHEK GUIDE) test strip Use as instructed to check blood sugars three times per day. ICD-10 E11.65 and Z79.4 03/03/20   Fulp, Cammie, MD  ipratropium-albuterol (DUONEB) 0.5-2.5 (3) MG/3ML SOLN Take 3 mLs by nebulization every 6 (six) hours. 09/15/20   Elsie Stain, MD   JARDIANCE 25 MG TABS tablet Take 1 tablet (25 mg total) by mouth daily. 09/15/20   Elsie Stain, MD  Lancets Misc. (ACCU-CHEK FASTCLIX LANCET) KIT Use to check blood sugars 3 times per day 03/03/20   Fulp, Ander Gaster, MD  paliperidone (INVEGA SUSTENNA) 156 MG/ML SUSY injection Inject 1 mL (156 mg total) into the muscle once for 1 dose. Due 5/1 07/27/18 08/04/19  [name], MD  Respiratory Therapy Supplies (FLUTTER) DEVI Use with 4 times daily 03/27/19   Elsie Stain, MD  Spacer/Aero-Holding Chambers (AEROCHAMBER MV) inhaler Use as instructed 03/27/19   Elsie Stain, MD  SUMAtriptan (IMITREX) 50 MG tablet Take 1 tablet (50 mg total) by mouth every 2 (two) hours as needed for migraine. May repeat in 2 hours if headache persists or recurs. 09/15/20   Elsie Stain, MD  traMADol (ULTRAM) 50 MG tablet Take 1 tablet (50 mg total) by mouth every 6 (six) hours as needed for moderate pain or severe pain. 09/15/20   Elsie Stain, MD  valsartan (DIOVAN) 80 MG tablet Take 1 tablet (80 mg total) by mouth daily. 09/15/20   Elsie Stain, MD  VENTOLIN HFA 108 [phone] Base) MCG/ACT inhaler Inhale 2 puffs into the lungs every 4 (four) hours as needed for wheezing or shortness of breath. 11/20/19   Elsie Stain, MD  Insulin Glargine [facility] KWIKPEN) 100 UNIT/ML Inject 20 Units into the skin daily. Patient not taking: Reported on 07/15/2020 06/02/20 07/15/20  Elsie Stain, MD    Family History Family History  Problem Relation Age of Onset   Diabetes Mother    Cancer Mother        unsure what type   Heart disease Mother    Hypertension Mother    Dementia Father    Diabetes Brother    Dementia Maternal Grandfather    Cancer Paternal Grandmother 71       breast   Mental illness Neg Hx     Social History Social History   Tobacco Use   Smoking status: Every Day    Packs/day: 2.00    Pack years: 0.00    Types: Cigarettes   Smokeless tobacco: Never  Vaping Use   Vaping Use: Never  used  Substance Use Topics   Alcohol use: Not Currently   Drug use: Yes    Types: Marijuana    Comment: "have changed to hemp now" 09/17/17     Allergies   Metformin and related   Review of Systems Review of Systems  Constitutional:  Negative for activity change, appetite change, fatigue and fever.  HENT:  Positive for dental problem and facial swelling. Negative for congestion, sinus pressure, sneezing and sore throat.   Respiratory:  Negative for cough and shortness of breath.   Cardiovascular:  Negative for chest pain.  Gastrointestinal:  Negative for abdominal pain, diarrhea, nausea and vomiting.  Musculoskeletal:  Negative for  arthralgias and myalgias.  Neurological:  Negative for dizziness, light-headedness and headaches.    Physical Exam Triage Vital Signs ED Triage Vitals  Enc Vitals Group     BP 10/25/20 1754 122/74     Pulse Rate 10/25/20 1754 91     Resp 10/25/20 1754 (!) 22     Temp 10/25/20 1754 98.3 F (36.8 C)     Temp Source 10/25/20 1754 Oral     SpO2 10/25/20 1754 94 %     Weight --      Height --      Head Circumference --      Peak Flow --      Pain Score 10/25/20 1752 10     Pain Loc --      Pain Edu? --      Excl. in [place]? --    No data found.  Updated Vital Signs BP 122/74 (BP Location: Right Arm)   Pulse 91   Temp 98.3 F (36.8 C) (Oral)   Resp (!) 22   SpO2 94%   Visual Acuity Right Eye Distance:   Left Eye Distance:   Bilateral Distance:    Right Eye Near:   Left Eye Near:    Bilateral Near:     Physical Exam Vitals reviewed.  Constitutional:      General: He is awake.     Appearance: Normal appearance. He is normal weight. He is not ill-appearing.     Comments: Very pleasant male appears stated age sitting comfortably in exam room in no acute distress  HENT:     Head: Normocephalic and atraumatic.     Right Ear: External ear normal.     Left Ear: External ear normal.     Nose: Nose normal.     Mouth/Throat:      Dentition: Abnormal dentition. Dental tenderness, gingival swelling, dental caries and dental abscesses present.     Pharynx: Uvula midline. No oropharyngeal exudate or posterior oropharyngeal erythema.      Comments: No evidence of [place] angina Cardiovascular:     Rate and Rhythm: Normal rate and regular rhythm.     Heart sounds: Normal heart sounds, S1 normal and S2 normal. No murmur heard. Pulmonary:     Effort: Pulmonary effort is normal. No accessory muscle usage or respiratory distress.     Breath sounds: No stridor. Wheezing present. No rhonchi or rales.     Comments: Scattered wheezes throughout lung fields Abdominal:     General: Bowel sounds are normal.     Palpations: Abdomen is soft.     Tenderness: There is no abdominal tenderness.  Lymphadenopathy:     Head:     Right side of head: No submental, submandibular or tonsillar adenopathy.     Left side of head: No submental, submandibular or tonsillar adenopathy.     Cervical: No cervical adenopathy.  Neurological:     Mental Status: He is alert.  Psychiatric:        Behavior: Behavior is cooperative.     UC Treatments / Results  Labs (all labs ordered are listed, but only abnormal results are displayed) Labs Reviewed - No data to display  EKG   Radiology No results found.  Procedures Procedures (including critical care time)  Medications Ordered in UC Medications - No data to display  Initial Impression / Assessment and Plan / UC Course  I have reviewed the triage vital signs and the nursing notes.  Pertinent labs & imaging results that  were available during my care of the patient were reviewed by me and considered in my medical decision making (see chart for details).     Dental infection noted on exam.  Patient started on Augmentin.  He did request stronger pain medication but is prescribed tramadol by pain clinic so we discussed that we cannot increase opioid medication.  Recommended that he use  over-the-counter medications.  He was given contact information for local dentist as he will likely need dental work to prevent recurrent infections.  Discussed alarm symptoms that warrant emergent evaluation.  Strict return precautions given to which patient expressed understanding.   Final Clinical Impressions(s) / UC Diagnoses   Final diagnoses:  Dental infection  Dentalgia     Discharge Instructions      Take Augmentin twice a day for 10 days to treat infection.  Follow-up with dentist as we discussed.  Continue using your tramadol as well as over-the-counter medications for pain relief.  If you have any swelling of your face/tongue, shortness of breath, inability to swallow you need to go to the emergency room.     ED Prescriptions     Medication Sig Dispense Auth. Provider   amoxicillin-clavulanate (AUGMENTIN) 875-125 MG tablet Take 1 tablet by mouth every 12 (twelve) hours for 10 days. 20 tablet Sheretta Grumbine, Derry Skill, PA-C      PDMP not reviewed this encounter.   Terrilee Croak, PA-C 10/25/20 1828

## 2020-10-25 NOTE — Discharge Instructions (Addendum)
Take Augmentin twice a day for 10 days to treat infection.  Follow-up with dentist as we discussed.  Continue using your tramadol as well as over-the-counter medications for pain relief.  If you have any swelling of your face/tongue, shortness of breath, inability to swallow you need to go to the emergency room.

## 2020-10-25 NOTE — ED Triage Notes (Signed)
Pt presents with dental pain x 4 days. Pain is worse at night.

## 2020-10-26 ENCOUNTER — Ambulatory Visit (HOSPITAL_COMMUNITY): Payer: Self-pay

## 2020-10-28 NOTE — Telephone Encounter (Signed)
Patient has upcoming appointment.

## 2020-10-29 ENCOUNTER — Telehealth: Payer: Self-pay | Admitting: Critical Care Medicine

## 2020-10-29 DIAGNOSIS — K047 Periapical abscess without sinus: Secondary | ICD-10-CM

## 2020-10-29 NOTE — Telephone Encounter (Signed)
  PT is in [facility] asking to speak w/ a nurse regarding this medication.   amoxicillin-clavulanate (AUGMENTIN) 875-125 MG tablet [[id number]] Pt states he is not feeling well and is urgent please.

## 2020-10-29 NOTE — Telephone Encounter (Signed)
Pt was seen in ED on 10/25/2020 for dental pain was given antibiotic and now he states that he is having an allergic reaction to the medication.  Pt was instructed to go to the ED.

## 2020-10-29 NOTE — Telephone Encounter (Signed)
Pt needs to speak to Dr. Joya Gaskins about   amoxicillin-clavulanate (AUGMENTIN) 875-125 MG tablet PT states he cannot sleep. Pt was advised to go to UC but said he won't go.

## 2020-10-30 ENCOUNTER — Encounter: Payer: Self-pay | Admitting: Critical Care Medicine

## 2020-10-30 DIAGNOSIS — K047 Periapical abscess without sinus: Secondary | ICD-10-CM | POA: Insufficient documentation

## 2020-10-30 DIAGNOSIS — F17200 Nicotine dependence, unspecified, uncomplicated: Secondary | ICD-10-CM | POA: Diagnosis not present

## 2020-10-30 DIAGNOSIS — F250 Schizoaffective disorder, bipolar type: Secondary | ICD-10-CM | POA: Diagnosis not present

## 2020-10-30 MED ORDER — CLINDAMYCIN HCL 150 MG PO CAPS: 150.0000 mg | ORAL_CAPSULE | Freq: Three times a day (TID) | ORAL | 0 refills | Status: AC

## 2020-10-30 NOTE — Telephone Encounter (Signed)
I spoke to the patient , having reaction to Augmentin  Will DC and start Cleocin and get him to dental referral

## 2020-11-04 ENCOUNTER — Emergency Department (EMERGENCY_DEPARTMENT_HOSPITAL)
Admission: EM | Admit: 2020-11-04 | Discharge: 2020-11-05 | Disposition: A | Discharge status: Other Acute Inpatient Hospital | Payer: Medicare Other | Source: Home / Self Care | Attending: Emergency Medicine | Admitting: Emergency Medicine

## 2020-11-04 ENCOUNTER — Ambulatory Visit: Payer: Medicare Other | Admitting: Critical Care Medicine

## 2020-11-04 ENCOUNTER — Emergency Department (HOSPITAL_COMMUNITY)
Admission: EM | Admit: 2020-11-04 | Discharge: 2020-11-04 | Discharge status: Against Medical Advice | Payer: Medicare Other

## 2020-11-04 ENCOUNTER — Other Ambulatory Visit: Payer: Self-pay

## 2020-11-04 ENCOUNTER — Emergency Department (HOSPITAL_COMMUNITY)
Admission: EM | Admit: 2020-11-04 | Discharge: 2020-11-04 | Disposition: A | Discharge status: Home / Self Care | Payer: Medicare Other | Attending: Emergency Medicine | Admitting: Emergency Medicine

## 2020-11-04 ENCOUNTER — Ambulatory Visit: Payer: Self-pay | Admitting: *Deleted

## 2020-11-04 DIAGNOSIS — J449 Chronic obstructive pulmonary disease, unspecified: Secondary | ICD-10-CM | POA: Diagnosis not present

## 2020-11-04 DIAGNOSIS — L0231 Cutaneous abscess of buttock: Secondary | ICD-10-CM | POA: Insufficient documentation

## 2020-11-04 DIAGNOSIS — Z7951 Long term (current) use of inhaled steroids: Secondary | ICD-10-CM | POA: Insufficient documentation

## 2020-11-04 DIAGNOSIS — R443 Hallucinations, unspecified: Secondary | ICD-10-CM | POA: Insufficient documentation

## 2020-11-04 DIAGNOSIS — F1220 Cannabis dependence, uncomplicated: Secondary | ICD-10-CM | POA: Diagnosis present

## 2020-11-04 DIAGNOSIS — Z046 Encounter for general psychiatric examination, requested by authority: Secondary | ICD-10-CM | POA: Insufficient documentation

## 2020-11-04 DIAGNOSIS — E119 Type 2 diabetes mellitus without complications: Secondary | ICD-10-CM | POA: Insufficient documentation

## 2020-11-04 DIAGNOSIS — F250 Schizoaffective disorder, bipolar type: Secondary | ICD-10-CM | POA: Diagnosis present

## 2020-11-04 DIAGNOSIS — F12951 Cannabis use, unspecified with psychotic disorder with hallucinations: Secondary | ICD-10-CM | POA: Diagnosis present

## 2020-11-04 DIAGNOSIS — F411 Generalized anxiety disorder: Secondary | ICD-10-CM | POA: Diagnosis present

## 2020-11-04 DIAGNOSIS — Z20822 Contact with and (suspected) exposure to covid-19: Secondary | ICD-10-CM | POA: Insufficient documentation

## 2020-11-04 DIAGNOSIS — I10 Essential (primary) hypertension: Secondary | ICD-10-CM | POA: Insufficient documentation

## 2020-11-04 DIAGNOSIS — F17210 Nicotine dependence, cigarettes, uncomplicated: Secondary | ICD-10-CM | POA: Insufficient documentation

## 2020-11-04 DIAGNOSIS — Y900 Blood alcohol level of less than 20 mg/100 ml: Secondary | ICD-10-CM | POA: Insufficient documentation

## 2020-11-04 LAB — COMPREHENSIVE METABOLIC PANEL
ALT: 31 U/L (ref 0–44)
AST: 31 U/L (ref 15–41)
Albumin: 3.6 g/dL (ref 3.5–5.0)
Alkaline Phosphatase: 62 U/L (ref 38–126)
Anion gap: 11 (ref 5–15)
BUN: 27 mg/dL — ABNORMAL HIGH (ref 8–23)
CO2: 22 mmol/L (ref 22–32)
Calcium: 9.5 mg/dL (ref 8.9–10.3)
Chloride: 102 mmol/L (ref 98–111)
Creatinine, Ser: 1.9 mg/dL — ABNORMAL HIGH (ref 0.61–1.24)
GFR, Estimated: 39 mL/min — ABNORMAL LOW (ref >60)
Glucose, Bld: 125 mg/dL — ABNORMAL HIGH (ref 70–99)
Potassium: 5 mmol/L (ref 3.5–5.1)
Sodium: 135 mmol/L (ref 135–145)
Total Bilirubin: 0.7 mg/dL (ref 0.3–1.2)
Total Protein: 6.5 g/dL (ref 6.5–8.1)

## 2020-11-04 LAB — CBC
HCT: 45.3 % (ref 39.0–52.0)
Hemoglobin: 15.1 g/dL (ref 13.0–17.0)
MCH: 33.3 pg (ref 26.0–34.0)
MCHC: 33.3 g/dL (ref 30.0–36.0)
MCV: 99.8 fL (ref 80.0–100.0)
Platelets: 136 10*3/uL — ABNORMAL LOW (ref 150–400)
RBC: 4.54 MIL/uL (ref 4.22–5.81)
RDW: 14 % (ref 11.5–15.5)
WBC: 9.8 10*3/uL (ref 4.0–10.5)
nRBC: 0 % (ref 0.0–0.2)

## 2020-11-04 LAB — RAPID URINE DRUG SCREEN, HOSP PERFORMED
Amphetamines: NOT DETECTED
Barbiturates: NOT DETECTED
Benzodiazepines: NOT DETECTED
Cocaine: NOT DETECTED
Opiates: NOT DETECTED
Tetrahydrocannabinol: POSITIVE — AB

## 2020-11-04 LAB — ETHANOL: Alcohol, Ethyl (B): <10 mg/dL (ref <10)

## 2020-11-04 MED ORDER — DIVALPROEX SODIUM ER 500 MG PO TB24
1500.0000 mg | ORAL_TABLET | Freq: Every day | ORAL | Status: DC
  Administered 2020-11-04: 1500 mg via ORAL
  Filled 2020-11-04 (×2): qty 3

## 2020-11-04 MED ORDER — IRBESARTAN 75 MG PO TABS
75.0000 mg | ORAL_TABLET | Freq: Every day | ORAL | Status: DC
  Administered 2020-11-05: 75 mg via ORAL
  Filled 2020-11-04: qty 1

## 2020-11-04 MED ORDER — EMPAGLIFLOZIN 25 MG PO TABS
25.0000 mg | ORAL_TABLET | Freq: Every day | ORAL | Status: DC
  Administered 2020-11-05: 25 mg via ORAL
  Filled 2020-11-04: qty 1

## 2020-11-04 MED ORDER — GABAPENTIN 100 MG PO CAPS
200.0000 mg | ORAL_CAPSULE | Freq: Two times a day (BID) | ORAL | Status: DC
Start: 2020-11-04 — End: 2020-11-04

## 2020-11-04 MED ORDER — SUMATRIPTAN SUCCINATE 50 MG PO TABS: 50.0000 mg | ORAL_TABLET | Freq: Two times a day (BID) | ORAL | Status: DC | PRN

## 2020-11-04 MED ORDER — TRAMADOL HCL 50 MG PO TABS: 50.0000 mg | ORAL_TABLET | Freq: Four times a day (QID) | ORAL | Status: DC | PRN

## 2020-11-04 MED ORDER — GABAPENTIN 300 MG PO CAPS
300.0000 mg | ORAL_CAPSULE | Freq: Every day | ORAL | Status: DC
  Administered 2020-11-04: 300 mg via ORAL
  Filled 2020-11-04: qty 1

## 2020-11-04 MED ORDER — GABAPENTIN 300 MG PO CAPS
300.0000 mg | ORAL_CAPSULE | Freq: Two times a day (BID) | ORAL | Status: DC
  Administered 2020-11-05: 300 mg via ORAL
  Filled 2020-11-04: qty 1

## 2020-11-04 NOTE — Telephone Encounter (Signed)
It's 11:10 so I called [facility] to see if pt had arrived for his appt.   He had not.   "He is at the 10 minute window where he may need to reschedule".    She double checked and he was not there yet.   I thanked her for checking and told her why I was calling.   (Wife had requested medication for pt's nerves earlier this morning for this appt).

## 2020-11-04 NOTE — ED Provider Notes (Signed)
Emergency Medicine Provider Triage Evaluation Note  Joshua [name] , a 65 y.o. male  was evaluated in triage.  Pt complains of patient presents IVC by family's they endorse he is having hallucinations and delusions has not been taking his medications, known history of bipolar, schizophrenia,.  Patient endorses hallucinations during my exam, he denies suicidal or homicidal ideations.  Has no other complaints at this time.  Review of Systems  Positive: Hallucinations and delusions Negative: Denies homicidal or suicidal ideations  Physical Exam  BP 108/75 (BP Location: Right Arm)   Pulse 81   Temp 98 F (36.7 C) (Oral)   Resp 20   Ht 5\' 9"  (1.753 m)   Wt 111.6 kg   SpO2 91%   BMI 36.33 kg/m  Gen:   Awake, no distress   Resp:  Normal effort  MSK:   Moves extremities without difficulty  Other:    Medical Decision Making  Medically screening exam initiated at 7:59 PM.  Appropriate orders placed.  Joshua [name] was informed that the remainder of the evaluation will be completed by another provider, this initial triage assessment does not replace that evaluation, and the importance of remaining in the ED until their evaluation is complete.  Patient presents with hallucination delusions IVC by family lab work has been ordered patient need further work-up in the emergency department.   Marcello Fennel, PA-C 11/04/20 2001    Deno Etienne, DO 11/04/20 2322

## 2020-11-04 NOTE — Telephone Encounter (Signed)
FYI

## 2020-11-04 NOTE — Telephone Encounter (Signed)
Patients wife called in asking if patient can be giving s shot to calm him, like lymvaga, because won;t be able to make appt if not. Please call back. [phone]  Called patient to review request. No answer, left message to call clinic back. Patient is to be scheduled for appt at 1100 today. Patient should be arriving at office. Please review wife's request upon arrival for appt.

## 2020-11-04 NOTE — ED Notes (Signed)
Patient does not want treatment

## 2020-11-04 NOTE — ED Triage Notes (Signed)
PT IVC by Earnie Larsson Blaker, d/t concerns for pt increasing hallucinations, increased aggression, and noncompliance with medications. HX bipolar and schizophrenia.   Pt states he has been drinking every days for past couple of days. Denies SI/HI. Denies hallucinations. [employer] with pt

## 2020-11-04 NOTE — ED Notes (Signed)
Pt not found in lobby 

## 2020-11-04 NOTE — ED Notes (Signed)
ED Provider at bedside. 

## 2020-11-04 NOTE — ED Provider Notes (Addendum)
[facility] EMERGENCY DEPARTMENT Provider Note   CSN: [id number] Arrival date & time: 11/04/20  1830     History Chief Complaint  Patient presents with   IVC    Joshua [name] is a 65 y.o. male.  65 yo M with a chief complaints of hallucinations.  Patient tells me that he has been doing actually fine today but has been hallucinating especially when he drinks alcohol or smokes weed.  States that all of a sudden the police showed up and took him here.  He is not really sure why.  He does think that people have been irritating him somewhat today.  He denies SI or HI.  Had a spot on his buttock that started draining and is gotten a little bit better.  No fevers or chills.  Has had some chronic lower extremity edema that he is unhappy with.  Some chronic abdominal distention.  The history is provided by the patient.  Illness Severity:  Moderate Onset quality:  Gradual Duration:  2 days Timing:  Constant Progression:  Worsening Chronicity:  New Associated symptoms: no abdominal pain, no chest pain, no congestion, no diarrhea, no fever, no headaches, no myalgias, no rash, no shortness of breath and no vomiting       Past Medical History:  Diagnosis Date   Back pain    Bipolar disorder ([place])    Cannabis use disorder, moderate, dependence (HCC)    Chronic pain    Diabetes mellitus without complication (HCC)    Generalized anxiety disorder    Headache    Hypertension    Schizoaffective disorder [facility])     Patient Active Problem List   Diagnosis Date Noted   Dental abscess 10/30/2020   Spinal stenosis of lumbar region 02/19/2020   Nicotine abuse 08/05/2019   Type 2 diabetes mellitus with hyperglycemia ([place]) 05/14/2019   Peripheral vascular disease due to secondary diabetes ([place]) 05/14/2019   Thrombocytopenia ([place]) 05/14/2019   Chronic low back pain with left-sided sciatica 05/14/2019   Tobacco use 05/14/2019   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    Schizoaffective disorder ([place]) 07/24/2018   Chronic pain syndrome 10/14/2016   Osteoarthritis 10/14/2016   HTN (hypertension) 10/14/2016   Migraine headache without aura 10/14/2016   Schizoaffective disorder, bipolar type ([place]) 05/10/2016   Cannabis use disorder, severe, dependence ([place]) 05/10/2016   Generalized anxiety disorder 05/04/2016    Past Surgical History:  Procedure Laterality Date   APPENDECTOMY  1970       Family History  Problem Relation Age of Onset   Diabetes Mother    Cancer Mother        unsure what type   Heart disease Mother    Hypertension Mother    Dementia Father    Diabetes Brother    Dementia Maternal Grandfather    Cancer Paternal Grandmother 71       breast   Mental illness Neg Hx     Social History   Tobacco Use   Smoking status: Every Day    Packs/day: 2.00    Pack years: 0.00    Types: Cigarettes   Smokeless tobacco: Never  Vaping Use   Vaping Use: Never used  Substance Use Topics   Alcohol use: Not Currently   Drug use: Yes    Types: Marijuana    Comment: "have changed to hemp now" 09/17/17    Home Medications Prior to Admission medications   Medication Sig Start Date End Date Taking? Authorizing  Provider  Accu-Chek FastClix Lancets MISC Use to check blood sugars three times per day 03/03/20   Antony Blackbird, MD  blood glucose meter kit and supplies KIT Dispense based on patient and insurance preference. Use up to four times daily as directed. ICD-10 E11.65  Z79.4 07/20/19   Antony Blackbird, MD  blood glucose meter kit and supplies Dispense based on patient and insurance preference. Use up to four times daily as directed. (FOR ICD-10 E10.9, E11.9). 07/20/19   Fulp, Cammie, MD  Blood Glucose Monitoring Suppl (ACCU-CHEK GUIDE ME) w/Device KIT Use to check blood sugars up to 3 times per day. ICD-10 E11.65, Z79.4 03/03/20   Fulp, Cammie, MD  clindamycin (CLEOCIN) 150 MG capsule Take 1 capsule (150 mg total) by mouth 3 (three) times daily for  5 days. 10/30/20 11/04/20  Elsie Stain, MD  diclofenac (VOLTAREN) 50 MG EC tablet TAKE 1 TABLET BY MOUTH TWICE DAILY AS NEEDED FOR MODERATE PAIN. EAT BEFORE TAKING MEDICATION 09/15/20   Elsie Stain, MD  divalproex (DEPAKOTE ER) 500 MG 24 hr tablet Take 3 tablets (1,500 mg total) by mouth at bedtime. 07/27/18   [name], MD  Dulaglutide (TRULICITY) 1.5 EU/2.3NT SOPN Inject 1.5 mg into the skin once a week. 09/15/20   Elsie Stain, MD  gabapentin (NEURONTIN) 300 MG capsule Take one pill twice per day and two at bedtime 09/15/20   Elsie Stain, MD  glucose blood (ACCU-CHEK GUIDE) test strip Use as instructed to check blood sugars three times per day. ICD-10 E11.65 and Z79.4 03/03/20   Fulp, Cammie, MD  ipratropium-albuterol (DUONEB) 0.5-2.5 (3) MG/3ML SOLN Take 3 mLs by nebulization every 6 (six) hours. 09/15/20   Elsie Stain, MD  JARDIANCE 25 MG TABS tablet Take 1 tablet (25 mg total) by mouth daily. 09/15/20   Elsie Stain, MD  Lancets Misc. (ACCU-CHEK FASTCLIX LANCET) KIT Use to check blood sugars 3 times per day 03/03/20   Fulp, Ander Gaster, MD  paliperidone (INVEGA SUSTENNA) 156 MG/ML SUSY injection Inject 1 mL (156 mg total) into the muscle once for 1 dose. Due 5/1 07/27/18 08/04/19  [name], MD  Respiratory Therapy Supplies (FLUTTER) DEVI Use with 4 times daily 03/27/19   Elsie Stain, MD  Spacer/Aero-Holding Chambers (AEROCHAMBER MV) inhaler Use as instructed 03/27/19   Elsie Stain, MD  SUMAtriptan (IMITREX) 50 MG tablet Take 1 tablet (50 mg total) by mouth every 2 (two) hours as needed for migraine. May repeat in 2 hours if headache persists or recurs. 09/15/20   Elsie Stain, MD  traMADol (ULTRAM) 50 MG tablet Take 1 tablet (50 mg total) by mouth every 6 (six) hours as needed for moderate pain or severe pain. 09/15/20   Elsie Stain, MD  valsartan (DIOVAN) 80 MG tablet Take 1 tablet (80 mg total) by mouth daily. 09/15/20   Elsie Stain, MD  VENTOLIN  HFA 108 [phone] Base) MCG/ACT inhaler Inhale 2 puffs into the lungs every 4 (four) hours as needed for wheezing or shortness of breath. 11/20/19   Elsie Stain, MD  Insulin Glargine [facility] KWIKPEN) 100 UNIT/ML Inject 20 Units into the skin daily. Patient not taking: Reported on 07/15/2020 06/02/20 07/15/20  Elsie Stain, MD    Allergies    Metformin and related  Review of Systems   Review of Systems  Constitutional:  Negative for chills and fever.  HENT:  Negative for congestion and facial swelling.   Eyes:  Negative for  discharge and visual disturbance.  Respiratory:  Negative for shortness of breath.   Cardiovascular:  Negative for chest pain and palpitations.  Gastrointestinal:  Negative for abdominal pain, diarrhea and vomiting.  Musculoskeletal:  Negative for arthralgias and myalgias.  Skin:  Negative for color change and rash.  Neurological:  Negative for tremors, syncope and headaches.  Psychiatric/Behavioral:  Positive for hallucinations. Negative for confusion and dysphoric mood.    Physical Exam Updated Vital Signs BP 108/75 (BP Location: Right Arm)   Pulse 81   Temp 98 F (36.7 C) (Oral)   Resp 20   Ht $R'5\' 9"'Eu$  (1.753 m)   Wt 111.6 kg   SpO2 91%   BMI 36.33 kg/m   Physical Exam Vitals and nursing note reviewed.  Constitutional:      Appearance: He is well-developed.  HENT:     Head: Normocephalic and atraumatic.  Eyes:     Pupils: Pupils are equal, round, and reactive to light.  Neck:     Vascular: No JVD.  Cardiovascular:     Rate and Rhythm: Normal rate and regular rhythm.     Heart sounds: No murmur heard.   No friction rub. No gallop.  Pulmonary:     Effort: No respiratory distress.     Breath sounds: No wheezing.  Abdominal:     General: There is no distension.     Tenderness: There is no abdominal tenderness. There is no guarding or rebound.  Genitourinary:    Comments: Area of erythema or induration or fluctuance to the left  buttock. Musculoskeletal:        General: Normal range of motion.     Cervical back: Normal range of motion and neck supple.  Skin:    Coloration: Skin is not pale.     Findings: No rash.  Neurological:     Mental Status: He is alert and oriented to person, place, and time.  Psychiatric:        Behavior: Behavior normal.    ED Results / Procedures / Treatments   Labs (all labs ordered are listed, but only abnormal results are displayed) Labs Reviewed  COMPREHENSIVE METABOLIC PANEL - Abnormal; Notable for the following components:      Result Value   Glucose, Bld 125 (*)    BUN 27 (*)    Creatinine, Ser 1.90 (*)    GFR, Estimated 39 (*)    All other components within normal limits  CBC - Abnormal; Notable for the following components:   Platelets 136 (*)    All other components within normal limits  RESP PANEL BY RT-PCR (FLU A&B, COVID) ARPGX2  ETHANOL  RAPID URINE DRUG SCREEN, HOSP PERFORMED    EKG None  Radiology No results found.  Procedures Procedures   Medications Ordered in ED Medications  divalproex (DEPAKOTE ER) 24 hr tablet 1,500 mg (has no administration in time range)  empagliflozin (JARDIANCE) tablet 25 mg (has no administration in time range)  SUMAtriptan (IMITREX) tablet 50 mg (has no administration in time range)  traMADol (ULTRAM) tablet 50 mg (has no administration in time range)  irbesartan (AVAPRO) tablet 75 mg (has no administration in time range)  gabapentin (NEURONTIN) capsule 300 mg (has no administration in time range)  gabapentin (NEURONTIN) capsule 300 mg (has no administration in time range)    ED Course  I have reviewed the triage vital signs and the nursing notes.  Pertinent labs & imaging results that were available during my care of the patient were  reviewed by me and considered in my medical decision making (see chart for details).    MDM Rules/Calculators/A&P                          65 yo M with a chief complaints of  hallucinations.  Is been going on for a few days to a week or so.  Usually happens when he does illegal drugs or drinks alcohol.  He is unsure exactly why he was IVC.  Apparently was IVC by his family.  I feel medically clear.  TTS evaluation.  The patients results and plan were reviewed and discussed.   Any x-rays performed were independently reviewed by myself.   Differential diagnosis were considered with the presenting HPI.  Medications  divalproex (DEPAKOTE ER) 24 hr tablet 1,500 mg (has no administration in time range)  empagliflozin (JARDIANCE) tablet 25 mg (has no administration in time range)  SUMAtriptan (IMITREX) tablet 50 mg (has no administration in time range)  traMADol (ULTRAM) tablet 50 mg (has no administration in time range)  irbesartan (AVAPRO) tablet 75 mg (has no administration in time range)  gabapentin (NEURONTIN) capsule 300 mg (has no administration in time range)  gabapentin (NEURONTIN) capsule 300 mg (has no administration in time range)    Vitals:   11/04/20 1936 11/04/20 1944  BP: 108/75   Pulse: 81   Resp: 20   Temp: 98 F (36.7 C)   TempSrc: Oral   SpO2: 91%   Weight:  111.6 kg  Height:  $Remove'5\' 9"'MTvBoDx$  (1.753 m)    Final diagnoses:  Hallucinations  Left buttock abscess     Final Clinical Impression(s) / ED Diagnoses Final diagnoses:  Hallucinations  Left buttock abscess    Rx / DC Orders ED Discharge Orders     None        Deno Etienne, DO 11/04/20 2312    Deno Etienne, DO 11/04/20 2322

## 2020-11-05 DIAGNOSIS — R059 Cough, unspecified: Secondary | ICD-10-CM | POA: Diagnosis not present

## 2020-11-05 DIAGNOSIS — E559 Vitamin D deficiency, unspecified: Secondary | ICD-10-CM | POA: Diagnosis not present

## 2020-11-05 DIAGNOSIS — M545 Low back pain, unspecified: Secondary | ICD-10-CM | POA: Diagnosis not present

## 2020-11-05 DIAGNOSIS — D519 Vitamin B12 deficiency anemia, unspecified: Secondary | ICD-10-CM | POA: Diagnosis not present

## 2020-11-05 DIAGNOSIS — E785 Hyperlipidemia, unspecified: Secondary | ICD-10-CM | POA: Diagnosis not present

## 2020-11-05 DIAGNOSIS — I10 Essential (primary) hypertension: Secondary | ICD-10-CM | POA: Diagnosis present

## 2020-11-05 DIAGNOSIS — N189 Chronic kidney disease, unspecified: Secondary | ICD-10-CM | POA: Diagnosis present

## 2020-11-05 DIAGNOSIS — F12951 Cannabis use, unspecified with psychotic disorder with hallucinations: Secondary | ICD-10-CM

## 2020-11-05 DIAGNOSIS — A6920 Lyme disease, unspecified: Secondary | ICD-10-CM | POA: Diagnosis not present

## 2020-11-05 DIAGNOSIS — B159 Hepatitis A without hepatic coma: Secondary | ICD-10-CM | POA: Diagnosis not present

## 2020-11-05 DIAGNOSIS — N390 Urinary tract infection, site not specified: Secondary | ICD-10-CM | POA: Diagnosis not present

## 2020-11-05 DIAGNOSIS — E119 Type 2 diabetes mellitus without complications: Secondary | ICD-10-CM | POA: Diagnosis present

## 2020-11-05 DIAGNOSIS — F411 Generalized anxiety disorder: Secondary | ICD-10-CM | POA: Diagnosis not present

## 2020-11-05 DIAGNOSIS — G43909 Migraine, unspecified, not intractable, without status migrainosus: Secondary | ICD-10-CM | POA: Diagnosis present

## 2020-11-05 DIAGNOSIS — J441 Chronic obstructive pulmonary disease with (acute) exacerbation: Secondary | ICD-10-CM | POA: Diagnosis not present

## 2020-11-05 DIAGNOSIS — Z79899 Other long term (current) drug therapy: Secondary | ICD-10-CM | POA: Diagnosis not present

## 2020-11-05 DIAGNOSIS — F1220 Cannabis dependence, uncomplicated: Secondary | ICD-10-CM

## 2020-11-05 DIAGNOSIS — R6889 Other general symptoms and signs: Secondary | ICD-10-CM | POA: Diagnosis not present

## 2020-11-05 DIAGNOSIS — G609 Hereditary and idiopathic neuropathy, unspecified: Secondary | ICD-10-CM | POA: Diagnosis present

## 2020-11-05 DIAGNOSIS — M4804 Spinal stenosis, thoracic region: Secondary | ICD-10-CM | POA: Diagnosis present

## 2020-11-05 DIAGNOSIS — E039 Hypothyroidism, unspecified: Secondary | ICD-10-CM | POA: Diagnosis not present

## 2020-11-05 DIAGNOSIS — F413 Other mixed anxiety disorders: Secondary | ICD-10-CM | POA: Diagnosis not present

## 2020-11-05 DIAGNOSIS — K219 Gastro-esophageal reflux disease without esophagitis: Secondary | ICD-10-CM | POA: Diagnosis present

## 2020-11-05 DIAGNOSIS — G894 Chronic pain syndrome: Secondary | ICD-10-CM | POA: Diagnosis not present

## 2020-11-05 DIAGNOSIS — F250 Schizoaffective disorder, bipolar type: Secondary | ICD-10-CM | POA: Diagnosis present

## 2020-11-05 DIAGNOSIS — Z712 Person consulting for explanation of examination or test findings: Secondary | ICD-10-CM | POA: Diagnosis not present

## 2020-11-05 DIAGNOSIS — Z046 Encounter for general psychiatric examination, requested by authority: Secondary | ICD-10-CM | POA: Diagnosis not present

## 2020-11-05 DIAGNOSIS — E1140 Type 2 diabetes mellitus with diabetic neuropathy, unspecified: Secondary | ICD-10-CM | POA: Diagnosis not present

## 2020-11-05 DIAGNOSIS — J449 Chronic obstructive pulmonary disease, unspecified: Secondary | ICD-10-CM | POA: Diagnosis present

## 2020-11-05 DIAGNOSIS — F259 Schizoaffective disorder, unspecified: Secondary | ICD-10-CM | POA: Diagnosis not present

## 2020-11-05 DIAGNOSIS — G8929 Other chronic pain: Secondary | ICD-10-CM | POA: Diagnosis not present

## 2020-11-05 HISTORY — DX: Cannabis use, unspecified with psychotic disorder with hallucinations: F12.951

## 2020-11-05 LAB — RESP PANEL BY RT-PCR (FLU A&B, COVID) ARPGX2
Influenza A by PCR: NEGATIVE
Influenza B by PCR: NEGATIVE
SARS Coronavirus 2 by RT PCR: NEGATIVE

## 2020-11-05 MED ORDER — HYDROCERIN EX CREA
TOPICAL_CREAM | Freq: Two times a day (BID) | CUTANEOUS | Status: DC
  Filled 2020-11-05: qty 113

## 2020-11-05 NOTE — ED Notes (Signed)
[facility] called for patient

## 2020-11-05 NOTE — ED Provider Notes (Signed)
Emergency Medicine Observation Re-evaluation Note  Joshua [name] is a 65 y.o. male, seen on rounds today.  Pt initially presented to the ED for complaints of IVC and Schizophrenia Currently, the patient is stable.  Physical Exam  BP 122/71 (BP Location: Left Arm)   Pulse 82   Temp (!) 97.4 F (36.3 C)   Resp 18   Ht 5\' 9"  (1.753 m)   Wt 111.6 kg   SpO2 91%   BMI 36.33 kg/m  Physical Exam General: awake and alert Cardiac: rrr Lungs: cta Psych: not actively hallucinating  ED Course / MDM  EKG:   I have reviewed the labs performed to date as well as medications administered while in observation.  Recent changes in the last 24 hours include none.  Plan  Current plan is for inpatient psych. Patient is under full IVC at this time.  Pt has been accepted at [facility] by Dr. Gerrit Halls.  He remains stable for transfer.   Joshua Pence, MD 11/05/20 1134

## 2020-11-05 NOTE — ED Notes (Signed)
Patient was given a Media planner and Drink.

## 2020-11-05 NOTE — ED Notes (Signed)
TTS in process 

## 2020-11-05 NOTE — Progress Notes (Signed)
Patient has been denied by [facility] and has been recommended for a geri psych inpatient placement. Patient meets inpatient criteria per Lindon Romp, PA-C. Patient referred to the following facilities:  [facility]  [address] [place] [address] [phone]  [facility]  [address] [place] [address] [phone] [place]  [place], [place] [address] [phone]  [facility]  [address] [place] [address] [phone] [facility]  [address] [place], [place] [address] [phone] [facility]  [address] [place] [address] [phone] [facility]  [address] [place] [address] [phone]  [facility]  [address], [place] [address] [phone] [place]  [address] [place] [address] [phone] [place], [place] [address] [phone]  [facility]  [address], [place] [address] [phone] [place]  [address] [place] [address] [phone] [facility]  [place], [place] [address] [phone]  [facility]  [address] [place]  [address] [phone]  [facility]  [address], [place] [address] [phone] [facility]  [address], [place] [address] [phone]    CSW will continue to monitor disposition.    Mariea Clonts, MSW,  LCSW-A  9:52 AM 11/05/2020

## 2020-11-05 NOTE — ED Notes (Signed)
Patient was given a Cup of ice water.

## 2020-11-05 NOTE — ED Notes (Signed)
Transport is here to get patient now

## 2020-11-05 NOTE — Progress Notes (Signed)
Pt accepted to [facility]     Patient meets inpatient criteria per Lindon Romp, PA-C  Dr.Rano Juliane Lack is the attending provider.    Call report to [place], RN @ [facility] ED notified.     Pt scheduled  to arrive at [facility] by 1700.    Mariea Clonts, MSW, LCSW-A  10:56 AM 11/05/2020

## 2020-11-05 NOTE — BH Assessment (Signed)
Comprehensive Clinical Assessment (CCA) Note  11/05/2020 Joshua [name] [id number]  Disposition:  Per Lindon Romp, NP, patient is recommended for inpatient gero-psych  The patient demonstrates the following risk factors for suicide: Chronic risk factors for suicide include: psychiatric disorder of schizoaffective disorder, substance use disorder, chronic pain, and demographic factors (male, >63 y/o). Acute risk factors for suicide include: family or marital conflict, unemployment, and social withdrawal/isolation. Protective factors for this patient include: positive social support, responsibility to others (children, family), and hope for the future. Considering these factors, the overall suicide risk at this point appears to be low. Patient is not appropriate for outpatient follow up due to his active psychosis. AIMS    Flowsheet Row Admission (Discharged) from 07/24/2018 in [place] [facility] Admission (Discharged) from 09/28/2016 in [place] [facility]  AIMS Total Score 0 0      AUDIT    Flowsheet Row Admission (Discharged) from 07/24/2018 in [place] [facility] Admission (Discharged) from 09/28/2016 in [place] [facility]  Alcohol Use Disorder Identification Test Final Score (AUDIT) 0 1      GAD-7    Flowsheet Row Office Visit from 06/02/2020 in [place] Office Visit from 03/06/2020 in [place] Office Visit from 01/28/2020 in [place] Office Visit from 09/18/2019 in [place] Office Visit from 05/14/2019 in [place]  Total GAD-7 Score 0 0 0 0 6      PHQ2-9    Flowsheet Row ED from 11/04/2020 in [place] Office Visit from 06/02/2020 in [place] Office Visit  from 03/06/2020 in [place] Office Visit from 01/28/2020 in [place] Office Visit from 09/18/2019 in [place]  PHQ-2 Total Score 0 3 0 3 2  PHQ-9 Total Score 5 11 1 12 5       Flowsheet Row ED from 11/04/2020 in [place] ED from 10/25/2020 in [facility] ED from 08/04/2019 in [place] DEPT  C-SSRS RISK CATEGORY No Risk No Risk No Risk        Chief Complaint:  Chief Complaint  Patient presents with   IVC   Schizophrenia   Visit Diagnosis: F25 Schizoaffective Disorder Bipolar Type    CCA Screening, Triage and Referral (STR)  Patient Reported Information How did you hear about [place]? Family/Friend (son took out IVC paperwork on patient)  What Is the Reason for Your Visit/Call Today? Per EDP Report: 65 yo M with a chief complaints of hallucinations.  Patient tells me that he has been doing actually fine today but has been hallucinating especially when he drinks alcohol or smokes weed.  States that all of a sudden the police showed up and took him here.  He is not really sure why.  He does think that people have been irritating him somewhat today.  He denies SI or HI.  TTS:  Patient was seen by TTS.  He presents as tangential, circumstantial and very disorganized with his thoughts.  His family reports that patient has not been taking his medication for his schizophrenia.  Patient was prescribed Invega, but states that he has not had it in several months because he states, "it does more harm  than good."  Patient was being seen at [facility] for his schizophrenia, but he stopped going.  His family scheduled an appointment for him today at [place], but patient stated that he was not feeling well, so he did not go.  Patient and his son evidently had a verbal altercation and the son  went out and petitioned him for commitment.  Evidently, patient must have been resistant to coming to the hospital because his arms were badly bruised by the police when they took him into custody.  Patient denies SI/HI.  However, he describes several different hallucinations, mostly visual, recently seen.  He states that in his front ditch of his home the other night that he saw people, coyotes and wolves.  He also spoke about seeing someone get hit by a semi in front of his house and getting up and walking off.  He also shared of going fishing one night and being surrounded by a group of people with snot in their noses.  Patient states that he sleeps okay, but states that he has not been eating that well.  Patient lives with his wife and son and he is on disability and has been since the age of 55 due to his mental health issues.  Patient is alert and oriented, his thoughts disorganized, but his memory is intact.  He admits to experiencing hallucinations.  His judgment, insight and impulse control are impaired.  He is disheveled and looks unkept.  His speech is pressured.  How Long Has This Been Causing You Problems? 1-6 months  What Do You Feel Would Help You the Most Today? Treatment for Depression or other mood problem   Have You Recently Had Any Thoughts About Hurting Yourself? No  Are You Planning to Commit Suicide/Harm Yourself At This time? No   Have you Recently Had Thoughts About [place]? No  Are You Planning to Harm Someone at This Time? No  Explanation: No data recorded  Have You Used Any Alcohol or Drugs in the Past 24 Hours? Yes  How Long Ago Did You Use Drugs or Alcohol? No data recorded What Did You Use and How Much? states that occasionally smokes marijuana and drinks a couple beers   Do You Currently Have a Therapist/Psychiatrist? No  Name of Therapist/Psychiatrist: No data recorded  Have You Been Recently Discharged From Any Office Practice or Programs?  No  Explanation of Discharge From Practice/Program: No data recorded    CCA Screening Triage Referral Assessment Type of Contact: Tele-Assessment  Telemedicine Service Delivery:   Is this Initial or Reassessment? Initial Assessment  Date Telepsych consult ordered in [facility]:  11/04/20  Time Telepsych consult ordered in [facility]:  2307  Location of Assessment: [facility] ED  Provider Location: [facility]   Collateral Involvement: not contacted since patient is going to be recommended for inpatient treatment   Does Patient Have a [place]? No data recorded Name and Contact of Legal Guardian: No data recorded If Minor and Not Living with Parent(s), Who has Custody? No data recorded Is CPS involved or ever been involved? Never  Is APS involved or ever been involved? Never   Patient Determined To Be At Risk for Harm To Self or Others Based on Review of Patient Reported Information or Presenting Complaint? No  Method: No data recorded Availability of Means: No data recorded Intent: No data recorded Notification Required: No data recorded Additional Information for Danger to Others Potential: No  data recorded Additional Comments for Danger to Others Potential: No data recorded Are There Guns or Other Weapons in [place]? No data recorded Types of Guns/Weapons: No data recorded Are These Weapons Safely Secured?                            No data recorded Who Could Verify You Are Able To Have These Secured: No data recorded Do You Have any Outstanding Charges, Pending Court Dates, Parole/Probation? No data recorded Contacted To Inform of Risk of Harm To Self or Others: Other: Comment (family is aware)    Does Patient Present under Involuntary Commitment? 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, theatre/television/film   Mental Health Symptoms Depression:   Increase/decrease in appetite; Change in energy/activity   Duration of Depressive symptoms:    Mania:   None   Anxiety:    None   Psychosis:   Delusions; Hallucinations   Duration of Psychotic symptoms:  Duration of Psychotic Symptoms: Less than six months   Trauma:   None   Obsessions:   None   Compulsions:   None   Inattention:   None   Hyperactivity/Impulsivity:   None   Oppositional/Defiant Behaviors:   None   Emotional Irregularity:   Potentially harmful impulsivity; Mood lability   Other Mood/Personality Symptoms:   tangential, circumstantial, disorganized    Mental Status Exam Appearance and self-care  Stature:   Average   Weight:   Average weight   Clothing:   Disheveled; Casual   Grooming:   Neglected   Cosmetic use:   None   Posture/gait:   Normal   Motor activity:   Not Remarkable   Sensorium  Attention:   Normal   Concentration:   Normal   Orientation:   Object; Person; Place; Time; Situation   Recall/memory:   Normal   Affect and Mood  Affect:   Blunted; Flat   Mood:   Irritable (irritable with family members)   Relating  Eye contact:   Normal   Facial expression:   Responsive   Attitude toward examiner:   Cooperative   Thought and Language  Speech flow:  Pressured; Flight of Ideas   Thought content:   Ideas of Reference; Delusions   Preoccupation:   None   Hallucinations:   Visual   Organization:  No data recorded  Transport planner of Knowledge:   Fair   Intelligence:   Below average   Abstraction:   Functional   Judgement:   Impaired   Reality Testing:   Distorted   Insight:   Lacking   Decision Making:    Impulsive   Social Functioning  Social Maturity:   Impulsive   Social Judgement:   Normal   Stress  Stressors:   Family conflict   Coping Ability:   Normal   Skill Deficits:   Self-care; Decision making   Supports:   Family     Religion: Religion/Spirituality Are You A Religious Person?:  (not assessed) How Might This Affect Treatment?: not assessed  Leisure/Recreation: Leisure / Recreation Do You Have Hobbies?: Yes Leisure and Hobbies:  fish, art work, Scientist, water quality work  Exercise/Diet: Exercise/Diet Do You Exercise?: No Have You Gained or Lost A Significant Amount of Weight in the Past Six Months?: No Do You Follow a Special Diet?: No Do You Have Any Trouble Sleeping?: No   CCA Employment/Education Employment/Work Situation: Employment / Work Technical sales engineer: On disability Why is Patient on Disability: mental health How Long has Patient Been on Disability: since age 15 Patient's Job has Been Impacted by Current Illness: Yes Describe how Patient's Job has Been Impacted: unable to maintain employment Has Patient ever Been in the [employer]?: No  Education: Education Is Patient Currently Attending School?: No Last Grade Completed: 8 Did You Attend College?: No Did You Have Any Difficulty At School?: No Patient's Education Has Been Impacted by Current Illness: No   CCA Family/Childhood History Family and Relationship History: Family history Marital status: Married Number of Years Married:  (not assessed) What types of issues is patient dealing with in the relationship?: "She has put up with me for many years" Does patient have children?: Yes How many children?: 2 How is patient's relationship with their children?: has one adult son living in the home  Childhood History:  Childhood History By whom was/is the patient raised?: Both parents Did patient suffer any verbal/emotional/physical/sexual abuse as a child?: No Did patient suffer from  severe childhood neglect?: No Has patient ever been sexually abused/assaulted/raped as an adolescent or adult?: No Was the patient ever a victim of a crime or a disaster?: No Witnessed domestic violence?: No Has patient been affected by domestic violence as an adult?: No  Child/Adolescent Assessment:     CCA Substance Use Alcohol/Drug Use: Alcohol / Drug Use Pain Medications: Pt denies abusing medication Prescriptions: Pt denies abusing medication Over the Counter: Denies abusing medication History of alcohol / drug use?: Yes Longest period of sobriety (when/how long): Unknown Withdrawal Symptoms: None Substance #1 Name of Substance 1: marijuana 1 - Age of First Use: UTA 1 - Amount (size/oz): UTA 1 - Frequency: daily 1 - Duration: UTA 1 - Last Use / Amount: UTA 1 - Method of Aquiring: UTA 1- Route of Use: smoke Substance #2 Name of Substance 2: alcohol 2 - Age of First Use: UTA 2 - Amount (size/oz): 1-2 beers 2 - Method of Aquiring: store 2 - Route of Substance Use: oral                     ASAM's:  Six Dimensions of Multidimensional Assessment  Dimension 1:  Acute Intoxication and/or Withdrawal Potential:   Dimension 1:  Description of individual's past and current experiences of substance use and withdrawal: Patient has no current withdrawal symptoms  Dimension 2:  Biomedical Conditions and Complications:   Dimension 2:  Description of patient's biomedical conditions and  complications: Patient is diagnosed with diabetes, HTN and chronic back pain issues  Dimension 3:  Emotional, Behavioral, or Cognitive Conditions and Complications:  Dimension 3:  Description of emotional, behavioral, or cognitive conditions and complications: Patient has been diagnosed with schizo-affective disorder and is off his medications and experiencing hallucinations  Dimension 4:  Readiness to Change:  Dimension 4:  Description of Readiness to Change criteria: Patient has little  interest in changing his behaviors  Dimension 5:  Relapse, Continued use, or Continued Problem Potential:  Dimension 5:  Relapse, continued use, or continued problem potential critiera description: Sula Rumple lacks coping mechanisms and is at high risk for relapse  Dimension 6:  Recovery/Living Environment:  Dimension 6:  Recovery/Iiving environment criteria description: Patient lives in a safe and supportive environment, but there is some conflict at times when patient does not treat his mental illness  ASAM Severity Score: ASAM's Severity Rating Score: 13  ASAM Recommended Level of Treatment:     Substance use Disorder (SUD) Substance Use Disorder (SUD)  Checklist Symptoms of Substance Use: Continued use despite having a persistent/recurrent physical/psychological problem caused/exacerbated by use, Continued use despite persistent or recurrent social, interpersonal problems, caused or exacerbated by use, Recurrent use that results in a failure to fulfill major role obligations (work, school, home), Social, occupational, recreational activities given up or reduced due to use  Recommendations for Services/Supports/Treatments: Recommendations for Services/Supports/Treatments Recommendations For Services/Supports/Treatments: SAIOP (Substance Abuse Intensive Outpatient Program)  Discharge Disposition:    DSM5 Diagnoses: Patient Active Problem List   Diagnosis Date Noted   Dental abscess 10/30/2020   Spinal stenosis of lumbar region 02/19/2020   Nicotine abuse 08/05/2019   Type 2 diabetes mellitus with hyperglycemia ([place]) 05/14/2019   Peripheral vascular disease due to secondary diabetes ([place]) 05/14/2019   Thrombocytopenia ([place]) 05/14/2019   Chronic low back pain with left-sided sciatica 05/14/2019   Tobacco use 05/14/2019   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) 05/10/2016   Cannabis use disorder, severe, dependence ([place]) 05/10/2016   Generalized anxiety disorder 05/04/2016     Referrals to Alternative Service(s): Referred to Alternative Service(s):   Place:   Date:   Time:    Referred to Alternative Service(s):   Place:   Date:   Time:    Referred to Alternative Service(s):   Place:   Date:   Time:    Referred to Alternative Service(s):   Place:   Date:   Time:     Kynsley Whitehouse J Haly Feher, LCAS

## 2020-11-05 NOTE — Consult Note (Signed)
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 male patient admitted to [facility] ED after presenting with complaints of hallucinations .  HPI:  Joshua [name], 65 y.o., male patient seen via tele health by this provider, consulted with Dr. Hampton Abbot; and chart reviewed on 11/05/20.  On evaluation [place] reports he is feeling better today.  Patient reports yesterday "I Felt like there was a comment that was going to hit earth coming from another galaxy.  I do not know where this came from all of it was just on my mind.  I feel better now I just want to go home.  I know what happened yesterday I smoked some weed and then all that stuff started happening."  Patient reports he has outpatient psychiatric services with [facility].  Reports that he is compliant with his medications.  Patient denies suicidal/homicidal ideation, psychosis, and paranoia at this time.  Patient states he lives with his wife Sherre Poot and gave permission to speak to her for collateral information.  Patient also denies history of violent behavior. During evaluation Levar Fayson Dickens is sitting on side of bed in no acute distress.  He is alert, oriented x 4, calm and cooperative.  His mood is anxious with congruent affect.  He does not appear to be responding to internal/external stimuli or delusional thoughts.  Patient denies  suicidal/self-harm/homicidal ideation, psychosis, and paranoia.  Patient answered question appropriately.   Past Psychiatric History: See above  Risk to Self:  Denies Risk to Others:  Denies Prior Inpatient Therapy:  Yes Prior Outpatient Therapy:  Yes  Past Medical History:  Past Medical History:  Diagnosis Date   Back pain    Bipolar disorder (HCC)    Cannabis use disorder, moderate, dependence (HCC)    Chronic pain    Diabetes mellitus without complication ([place])    Generalized anxiety disorder    Headache    Hypertension    Schizoaffective disorder ([place])     Past Surgical History:  Procedure Laterality Date   APPENDECTOMY  1970   Family History:  Family History  Problem Relation Age of Onset   Diabetes Mother    Cancer Mother        unsure what type   Heart disease Mother    Hypertension Mother    Dementia Father    Diabetes Brother    Dementia Maternal Grandfather    Cancer Paternal Grandmother 71       breast   Mental illness Neg Hx    Family Psychiatric  History: Unaware Social History:  Social History   Substance and Sexual Activity  Alcohol Use Not Currently     Social History   Substance and Sexual Activity  Drug Use Yes   Types: Marijuana   Comment: "have changed to hemp now" 09/17/17    Social History   Socioeconomic History   Marital status: Married    Spouse name: Not on file   Number of children: Not  on file   Years of education: Not on file   Highest education level: Not on file  Occupational History   Not on file  Tobacco Use   Smoking status: Every Day    Packs/day: 2.00    Pack years: 0.00    Types: Cigarettes   Smokeless tobacco: Never  Vaping Use   Vaping Use: Never used  Substance and Sexual Activity   Alcohol use: Not Currently   Drug use: Yes    Types: Marijuana    Comment: "have changed to hemp now" 09/17/17   Sexual activity: Not on file  Other Topics Concern   Not on file  Social History Narrative   Not on file    Social Determinants of Health   Financial Resource Strain: Not on file  Food Insecurity: Not on file  Transportation Needs: Not on file  Physical Activity: Not on file  Stress: Not on file  Social Connections: Not on file   Additional Social History:    Allergies:   Allergies  Allergen Reactions   Haldol [Haloperidol]    Metformin And Related Diarrhea and Nausea And Vomiting   Trazodone And Nefazodone Itching    Labs:  Results for orders placed or performed during the hospital encounter of 11/04/20 (from the past 48 hour(s))  Rapid urine drug screen (hospital performed)     Status: Abnormal   Collection Time: 11/04/20  7:47 PM  Result Value Ref Range   Opiates NONE DETECTED NONE DETECTED   Cocaine NONE DETECTED NONE DETECTED   Benzodiazepines NONE DETECTED NONE DETECTED   Amphetamines NONE DETECTED NONE DETECTED   Tetrahydrocannabinol POSITIVE (A) NONE DETECTED   Barbiturates NONE DETECTED NONE DETECTED    Comment: (NOTE) DRUG SCREEN FOR MEDICAL PURPOSES ONLY.  IF CONFIRMATION IS NEEDED FOR ANY PURPOSE, NOTIFY LAB WITHIN 5 DAYS.  LOWEST DETECTABLE LIMITS FOR URINE DRUG SCREEN Drug Class                     Cutoff (ng/mL) Amphetamine and metabolites    1000 Barbiturate and metabolites    200 Benzodiazepine                 257 Tricyclics and metabolites     300 Opiates and metabolites        300 Cocaine and metabolites        300 THC                            50 Performed at [facility], [place] [address], [place], [place] [address]   Comprehensive metabolic panel     Status: Abnormal   Collection Time: 11/04/20  7:54 PM  Result Value Ref Range   Sodium 135 135 - 145 mmol/L   Potassium 5.0 3.5 - 5.1 mmol/L   Chloride 102 98 - 111 mmol/L   CO2 22 22 - 32 mmol/L   Glucose, Bld 125 (H) 70 - 99 mg/dL    Comment: Glucose reference range applies only to samples taken after fasting for at least 8 hours.   BUN 27 (H) 8 - 23 mg/dL   Creatinine, Ser 1.90 (H)  0.61 - 1.24 mg/dL   Calcium 9.5 8.9 - 10.3 mg/dL   Total Protein 6.5 6.5 - 8.1 g/dL   Albumin 3.6 3.5 - 5.0 g/dL   AST 31 15 - 41 U/L   ALT 31 0 - 44 U/L   Alkaline  Phosphatase 62 38 - 126 U/L   Total Bilirubin 0.7 0.3 - 1.2 mg/dL   GFR, Estimated 39 (L) >60 mL/min    Comment: (NOTE) Calculated using the CKD-EPI Creatinine Equation (2021)    Anion gap 11 5 - 15    Comment: Performed at [facility] [address], [place], [place] [address]  Ethanol     Status: None   Collection Time: 11/04/20  7:54 PM  Result Value Ref Range   Alcohol, Ethyl (B) <10 <10 mg/dL    Comment: (NOTE) Lowest detectable limit for serum alcohol is 10 mg/dL.  For medical purposes only. Performed at [facility], [place] [address], [place], [place] [address]   cbc     Status: Abnormal   Collection Time: 11/04/20  7:54 PM  Result Value Ref Range   WBC 9.8 4.0 - 10.5 K/uL   RBC 4.54 4.22 - 5.81 MIL/uL   Hemoglobin 15.1 13.0 - 17.0 g/dL   HCT 45.3 39.0 - 52.0 %   MCV 99.8 80.0 - 100.0 fL   MCH 33.3 26.0 - 34.0 pg   MCHC 33.3 30.0 - 36.0 g/dL   RDW 14.0 11.5 - 15.5 %   Platelets 136 (L) 150 - 400 K/uL    Comment: SPECIMEN CHECKED FOR CLOTS REPEATED TO VERIFY    nRBC 0.0 0.0 - 0.2 %    Comment: Performed at [facility], [place] [address] [place], [place] [address]  Resp Panel by RT-PCR (Flu A&B, Covid) Nasopharyngeal Swab     Status: None   Collection Time: 11/04/20 11:10 PM   Specimen: Nasopharyngeal Swab; Nasopharyngeal(NP) swabs in vial transport medium  Result Value Ref Range   SARS Coronavirus 2 by RT PCR NEGATIVE NEGATIVE    Comment: (NOTE) SARS-CoV-2 target nucleic acids are NOT DETECTED.  The SARS-CoV-2 RNA is generally detectable in upper respiratory specimens during the acute phase of infection. The lowest concentration of SARS-CoV-2 viral copies this assay can detect is 138 copies/mL. A negative result does not preclude SARS-Cov-2 infection and should not be used as  the sole basis for treatment or other patient management decisions. A negative result may occur with  improper specimen collection/handling, submission of specimen other than nasopharyngeal swab, presence of viral mutation(s) within the areas targeted by this assay, and inadequate number of viral copies(<138 copies/mL). A negative result must be combined with clinical observations, patient history, and epidemiological information. The expected result is Negative.  Fact Sheet for Patients:  EntrepreneurPulse.com.au  Fact Sheet for Healthcare Providers:  IncredibleEmployment.be  This test is no t yet approved or cleared by the [place] FDA and  has been authorized for detection and/or diagnosis of SARS-CoV-2 by FDA under an Emergency Use Authorization (EUA). This EUA will remain  in effect (meaning this test can be used) for the duration of the COVID-19 declaration under Section 564(b)(1) of the Act, 21 U.S.C.section 360bbb-3(b)(1), unless the authorization is terminated  or revoked sooner.       Influenza A by PCR NEGATIVE NEGATIVE   Influenza B by PCR NEGATIVE NEGATIVE    Comment: (NOTE) The Xpert Xpress SARS-CoV-2/FLU/RSV plus assay is intended as an aid in the diagnosis of influenza from Nasopharyngeal swab specimens and should not be used as a sole basis for treatment. Nasal washings and aspirates are unacceptable for Xpert Xpress SARS-CoV-2/FLU/RSV testing.  Fact Sheet for Patients: EntrepreneurPulse.com.au  Fact Sheet for Healthcare Providers: IncredibleEmployment.be  This test is not yet approved or  cleared by the [place] and has been authorized for detection and/or diagnosis of SARS-CoV-2 by FDA under an Emergency Use Authorization (EUA). This EUA will remain in effect (meaning this test can be used) for the duration of the COVID-19 declaration under Section 564(b)(1) of the Act, 21  U.S.C. section 360bbb-3(b)(1), unless the authorization is terminated or revoked.  Performed at [facility], [place] [address] [place], [place] [address]     Medications:  Current Facility-Administered Medications  Medication Dose Route Frequency Provider Last Rate Last Admin   divalproex (DEPAKOTE ER) 24 hr tablet 1,500 mg  1,500 mg Oral QHS Tyrone Nine, Dan, DO   1,500 mg at 11/04/20 2348   empagliflozin (JARDIANCE) tablet 25 mg  25 mg Oral Daily Deno Etienne, DO   25 mg at 11/05/20 6060   gabapentin (NEURONTIN) capsule 300 mg  300 mg Oral BID Deno Etienne, DO   300 mg at 11/05/20 0459   gabapentin (NEURONTIN) capsule 300 mg  300 mg Oral QHS Deno Etienne, DO   300 mg at 11/04/20 2348   hydrocerin (EUCERIN) cream   Topical BID Isla Pence, MD       irbesartan (AVAPRO) tablet 75 mg  75 mg Oral Daily Deno Etienne, DO   75 mg at 11/05/20 9774   SUMAtriptan (IMITREX) tablet 50 mg  50 mg Oral BID PRN Deno Etienne, DO       traMADol Veatrice Bourbon) tablet 50 mg  50 mg Oral Q6H PRN Deno Etienne, DO       Current Outpatient Medications  Medication Sig Dispense Refill   diclofenac (VOLTAREN) 50 MG EC tablet TAKE 1 TABLET BY MOUTH TWICE DAILY AS NEEDED FOR MODERATE PAIN. EAT BEFORE TAKING MEDICATION (Patient taking differently: Take 50 mg by mouth 2 (two) times daily.) 60 tablet 1   divalproex (DEPAKOTE ER) 500 MG 24 hr tablet Take 3 tablets (1,500 mg total) by mouth at bedtime. 90 tablet 11   Dulaglutide (TRULICITY) 1.5 FS/2.3TR SOPN Inject 1.5 mg into the skin once a week. (Patient taking differently: Inject 1.5 mg into the skin every Sunday.) 2 mL 2   gabapentin (NEURONTIN) 300 MG capsule Take one pill twice per day and two at bedtime (Patient taking differently: Take 600 mg by mouth 2 (two) times daily.) 360 capsule 1   hydrOXYzine (ATARAX/VISTARIL) 50 MG tablet Take 50 mg by mouth 3 (three) times daily.     ipratropium-albuterol (DUONEB) 0.5-2.5 (3) MG/3ML SOLN Take 3 mLs by nebulization every 6 (six) hours.  (Patient taking differently: Take 3 mLs by nebulization every 6 (six) hours as needed (shortness of breath).) 360 mL 0   JARDIANCE 25 MG TABS tablet Take 1 tablet (25 mg total) by mouth daily. 30 tablet 2   SUMAtriptan (IMITREX) 50 MG tablet Take 1 tablet (50 mg total) by mouth every 2 (two) hours as needed for migraine. May repeat in 2 hours if headache persists or recurs. 20 tablet 0   traMADol (ULTRAM) 50 MG tablet Take 1 tablet (50 mg total) by mouth every 6 (six) hours as needed for moderate pain or severe pain. (Patient taking differently: Take 100 mg by mouth every 6 (six) hours as needed for moderate pain or severe pain.) 120 tablet 1   traZODone (DESYREL) 100 MG tablet Take 200 mg by mouth at bedtime as needed for sleep.     valsartan (DIOVAN) 80 MG tablet Take 1 tablet (80 mg total) by mouth daily. 90 tablet 2   VENTOLIN  HFA 108 (90 Base) MCG/ACT inhaler Inhale 2 puffs into the lungs every 4 (four) hours as needed for wheezing or shortness of breath. 18 g 3   Accu-Chek FastClix Lancets MISC Use to check blood sugars three times per day 100 each 11   amoxicillin-clavulanate (AUGMENTIN) 875-125 MG tablet Take 1 tablet by mouth See admin instructions. Bid x 10 days (Patient not taking: Reported on 11/04/2020)     blood glucose meter kit and supplies KIT Dispense based on patient and insurance preference. Use up to four times daily as directed. ICD-10 E11.65  Z79.4 1 each 0   blood glucose meter kit and supplies Dispense based on patient and insurance preference. Use up to four times daily as directed. (FOR ICD-10 E10.9, E11.9). 1 each 0   Blood Glucose Monitoring Suppl (ACCU-CHEK GUIDE ME) w/Device KIT Use to check blood sugars up to 3 times per day. ICD-10 E11.65, Z79.4 1 kit 0   glucose blood (ACCU-CHEK GUIDE) test strip Use as instructed to check blood sugars three times per day. ICD-10 E11.65 and Z79.4 100 each 11   Lancets Misc. (ACCU-CHEK FASTCLIX LANCET) KIT Use to check blood sugars 3  times per day 1 kit 11   paliperidone (INVEGA SUSTENNA) 156 MG/ML SUSY injection Inject 1 mL (156 mg total) into the muscle once for 1 dose. Due 5/1 (Patient not taking: Reported on 11/04/2020) 1 mL 11   Respiratory Therapy Supplies (FLUTTER) DEVI Use with 4 times daily 1 each 0   Spacer/Aero-Holding Chambers (AEROCHAMBER MV) inhaler Use as instructed 1 each 0    Musculoskeletal: Strength & Muscle Tone: within normal limits Gait & Station: unsteady Patient leans: N/A          Psychiatric Specialty Exam:  Presentation  General Appearance:  Appropriate for Environment Eye Contact: Good Speech: Clear and Coherent; Normal Rate Speech Volume: Normal Handedness: Right  Mood and Affect  Mood: Anxious Affect: Congruent  Thought Process  Thought Processes: Coherent; Goal Directed Descriptions of Associations:Intact Orientation:Full (Time, Place and Person) Thought Content:WDL History of Schizophrenia/Schizoaffective disorder:Yes  Duration of Psychotic Symptoms:Greater than six months  Hallucinations:Hallucinations: None Ideas of Reference:None Suicidal Thoughts:Suicidal Thoughts: No Homicidal Thoughts:Homicidal Thoughts: No  Sensorium  Memory: Immediate Good; Recent Good; Remote Fair Judgment: Fair Insight: Present  Executive Functions  Concentration: Fair Attention Span: Fair Recall: Good Fund of Knowledge: Fair Language: Good  Psychomotor Activity  Psychomotor Activity: Psychomotor Activity: Normal  Assets  Assets: Communication Skills; Desire for Improvement; Financial Resources/Insurance; Housing; Social Support  Sleep  Sleep: Sleep: Good   Physical Exam: Physical Exam Vitals and nursing note reviewed. Exam conducted with a chaperone present.  Constitutional:      General: He is not in acute distress.    Appearance: Normal appearance. He is not ill-appearing.  HENT:     Head: Normocephalic.  Cardiovascular:     Rate and Rhythm:  Normal rate.  Pulmonary:     Effort: Pulmonary effort is normal.  Neurological:     Mental Status: He is alert and oriented to person, place, and time.  Psychiatric:        Attention and Perception: Attention and perception normal. He does not perceive auditory or visual hallucinations.        Mood and Affect: Mood and affect normal.        Speech: Speech normal.        Behavior: Behavior normal. Behavior is cooperative.        Thought Content: Thought content is not  paranoid or delusional. Thought content does not include homicidal or suicidal ideation.        Cognition and Memory: Cognition normal.        Judgment: Judgment is impulsive.   Review of Systems  Constitutional: Negative.   HENT: Negative.    Eyes: Negative.   Respiratory: Negative.    Cardiovascular: Negative.   Gastrointestinal: Negative.   Genitourinary: Negative.   Musculoskeletal: Negative.   Skin: Negative.   Neurological: Negative.   Endo/Heme/Allergies: Negative.   Psychiatric/Behavioral:  Depression: Stable. Hallucinations: Denies. Substance abuse: "Weed". Suicidal ideas: Denies. Nervous/anxious: Stable. Insomnia: Denies.        Patient stating he smoked some "weed" yesterday and that is why he was delusional.  Denies today.   Blood pressure (!) 153/90, pulse 75, temperature 98.3 F (36.8 C), resp. rate 20, height $RemoveBe'5\' 9"'[id number]$  (1.753 m), weight 111.6 kg, SpO2 97 %. Body mass index is 36.33 kg/m.  Treatment Plan Summary: Plan Disposition pending collateral information.  Attempted to contact patient's wife Abigail Butts but no answer.  Left HIPPA voice message  Assessment:  Patient reporting that he is feeling better and denies suicidal/homicidal ideation, psychosis, and paranoia.  States he is taking his medications as ordered and wants to go home  Disposition:  Disposition pending collateral information.    This service was provided via telemedicine using a 2-way, interactive audio and video technology.  Names of all  persons participating in this telemedicine service and their role in this encounter. Name: Earleen Newport, NP Role: PMHNP  Name: Dr. Hampton Abbot Role: Psychiatrist  Name: Adin Hector Role: Patient  Name: Duanne Guess  Role: Patient's nurse sent a secure message informing:  Will continue inpatient psychiatric treatment until collateral information.  Please inform MD only default listed.     Tamim Skog, NP 11/05/2020 3:45 PM

## 2020-11-16 DIAGNOSIS — F259 Schizoaffective disorder, unspecified: Secondary | ICD-10-CM | POA: Diagnosis not present

## 2020-12-23 ENCOUNTER — Ambulatory Visit: Payer: Medicare Other | Attending: Critical Care Medicine | Admitting: Critical Care Medicine

## 2020-12-23 ENCOUNTER — Other Ambulatory Visit (HOSPITAL_COMMUNITY): Payer: Self-pay

## 2020-12-23 ENCOUNTER — Encounter: Payer: Self-pay | Admitting: Critical Care Medicine

## 2020-12-23 ENCOUNTER — Other Ambulatory Visit: Payer: Self-pay

## 2020-12-23 VITALS — BP 102/68 | HR 110 | Ht 69.0 in | Wt 225.0 lb

## 2020-12-23 DIAGNOSIS — K047 Periapical abscess without sinus: Secondary | ICD-10-CM

## 2020-12-23 DIAGNOSIS — E1165 Type 2 diabetes mellitus with hyperglycemia: Secondary | ICD-10-CM

## 2020-12-23 DIAGNOSIS — F17200 Nicotine dependence, unspecified, uncomplicated: Secondary | ICD-10-CM | POA: Insufficient documentation

## 2020-12-23 DIAGNOSIS — G894 Chronic pain syndrome: Secondary | ICD-10-CM | POA: Diagnosis not present

## 2020-12-23 DIAGNOSIS — F1290 Cannabis use, unspecified, uncomplicated: Secondary | ICD-10-CM | POA: Insufficient documentation

## 2020-12-23 DIAGNOSIS — G8929 Other chronic pain: Secondary | ICD-10-CM | POA: Diagnosis not present

## 2020-12-23 DIAGNOSIS — M545 Low back pain, unspecified: Secondary | ICD-10-CM | POA: Diagnosis not present

## 2020-12-23 DIAGNOSIS — F12951 Cannabis use, unspecified with psychotic disorder with hallucinations: Secondary | ICD-10-CM

## 2020-12-23 DIAGNOSIS — Z888 Allergy status to other drugs, medicaments and biological substances status: Secondary | ICD-10-CM | POA: Diagnosis not present

## 2020-12-23 DIAGNOSIS — R443 Hallucinations, unspecified: Secondary | ICD-10-CM | POA: Diagnosis present

## 2020-12-23 DIAGNOSIS — M5442 Lumbago with sciatica, left side: Secondary | ICD-10-CM

## 2020-12-23 DIAGNOSIS — F319 Bipolar disorder, unspecified: Secondary | ICD-10-CM | POA: Diagnosis not present

## 2020-12-23 DIAGNOSIS — Z8249 Family history of ischemic heart disease and other diseases of the circulatory system: Secondary | ICD-10-CM | POA: Insufficient documentation

## 2020-12-23 DIAGNOSIS — J449 Chronic obstructive pulmonary disease, unspecified: Secondary | ICD-10-CM | POA: Diagnosis not present

## 2020-12-23 DIAGNOSIS — I10 Essential (primary) hypertension: Secondary | ICD-10-CM

## 2020-12-23 DIAGNOSIS — F1010 Alcohol abuse, uncomplicated: Secondary | ICD-10-CM | POA: Diagnosis not present

## 2020-12-23 DIAGNOSIS — Z7984 Long term (current) use of oral hypoglycemic drugs: Secondary | ICD-10-CM | POA: Insufficient documentation

## 2020-12-23 DIAGNOSIS — Z760 Encounter for issue of repeat prescription: Secondary | ICD-10-CM | POA: Diagnosis not present

## 2020-12-23 DIAGNOSIS — Z79899 Other long term (current) drug therapy: Secondary | ICD-10-CM | POA: Insufficient documentation

## 2020-12-23 DIAGNOSIS — G43009 Migraine without aura, not intractable, without status migrainosus: Secondary | ICD-10-CM

## 2020-12-23 DIAGNOSIS — Z794 Long term (current) use of insulin: Secondary | ICD-10-CM

## 2020-12-23 MED ORDER — AZITHROMYCIN 250 MG PO TABS: ORAL_TABLET | ORAL | 0 refills | Status: DC

## 2020-12-23 MED ORDER — TRAMADOL HCL 50 MG PO TABS: 50.0000 mg | ORAL_TABLET | Freq: Four times a day (QID) | ORAL | 1 refills | Status: DC | PRN

## 2020-12-23 MED ORDER — IPRATROPIUM-ALBUTEROL 0.5-2.5 (3) MG/3ML IN SOLN
3.0000 mL | Freq: Four times a day (QID) | RESPIRATORY_TRACT | 0 refills | Status: DC
  Filled 2020-12-23: qty 360, 30d supply, fill #0

## 2020-12-23 MED ORDER — TRULICITY 1.5 MG/0.5ML ~~LOC~~ SOAJ: 1.5000 mg | SUBCUTANEOUS | 4 refills | Status: DC

## 2020-12-23 MED ORDER — VALSARTAN 80 MG PO TABS: 80.0000 mg | ORAL_TABLET | Freq: Every day | ORAL | 2 refills | Status: DC

## 2020-12-23 MED ORDER — SUMATRIPTAN SUCCINATE 50 MG PO TABS: 50.0000 mg | ORAL_TABLET | ORAL | 0 refills | Status: DC | PRN

## 2020-12-23 MED ORDER — GABAPENTIN 300 MG PO CAPS: 600.0000 mg | ORAL_CAPSULE | Freq: Two times a day (BID) | ORAL | 3 refills | Status: DC

## 2020-12-23 MED ORDER — JARDIANCE 25 MG PO TABS: 25.0000 mg | ORAL_TABLET | Freq: Every day | ORAL | 2 refills | Status: DC

## 2020-12-23 MED ORDER — DICLOFENAC SODIUM 50 MG PO TBEC: DELAYED_RELEASE_TABLET | ORAL | 1 refills | Status: DC

## 2020-12-23 NOTE — Assessment & Plan Note (Signed)
Dental abscess has resolved but he has poor dentition and severe periodontal disease I referred him to urgent tooth for consideration of teeth extractions

## 2020-12-23 NOTE — Progress Notes (Signed)
Established Patient Office Visit  Subjective:  Patient ID: Joshua [name], male    DOB: 11-03-1955  Age: 65 y.o. MRN: [id number]  CC:  Chief Complaint  Patient presents with   Hospitalization Follow-up    HPI Joshua [name] presents for post [facility] f/u  Patient was seen in the [facility] emergency room on 12 July with acute delirium and hallucinations.  He had been drinking excessively alcohol and also increase marijuana use.  He has history of bipolar disorder and had elevated levels of Depakote in the system.  Note his hemoglobin A1c was 7.1.  Patient was transferred to [facility] between 12 July and discharged on 22 July.  Have a copy of all records including lab data.  Patient had normal complete metabolic panel blood counts slightly reduced vitamin D level normal zinc level elevated Depakote levels urinalysis was negative.  The patient was placed on Januvia and Trulicity was held as he did not have access to that medication.  He was given Lunesta for sleep and he was started back on Invega injections and the Depakote was discontinued.  Patient now comes in for post discharge follow-up he is coughing and wheezing more his bit more short of breath as well as round of his nebulizer medicines.  Cough is productive of thick yellow mucus.  Below is a copy of the emergency room visit on July 12  Joshua [name] is a 65 y.o. male patient admitted to [facility] ED after presenting with complaints of hallucinations .   HPI:  Joshua [name], 65 y.o., male patient seen via tele health by this provider, consulted with Dr. Nelly [name]; and chart reviewed on 11/05/20.  On evaluation Joshua [name] reports he is feeling better today.  Patient reports yesterday "I Felt like there was a comment that was going to hit earth coming from another galaxy.  I do not know where this came from all of it was just on my mind.  I feel better now I just want to go home.  I know what  happened yesterday I smoked some weed and then all that stuff started happening."  Patient reports he has outpatient psychiatric services with [facility].  Reports that he is compliant with his medications.  Patient denies suicidal/homicidal ideation, psychosis, and paranoia at this time.  Patient states he lives with his wife Joshua [name] and gave permission to speak to her for collateral information.  Patient also denies history of violent behavior. During evaluation Joshua [name] is sitting on side of bed in no acute distress.  He is alert, oriented x 4, calm and cooperative.  His mood is anxious with congruent affect.  He does not appear to be responding to internal/external stimuli or delusional thoughts.  Patient denies suicidal/self-harm/homicidal ideation, psychosis, and paranoia.  Patient answered question appropriately  Patient on arrival appears appropriate no longer hallucinating and he has been seen by his [facility] psychiatrist as an outpatient.  He is needing refills on his Imitrex Trulicity diclofenac tramadol and gabapentin.  Blood sugar on arrival was 157 Past Medical History:  Diagnosis Date   Back pain    Bipolar disorder (HCC)    Cannabis use disorder, moderate, dependence (HCC)    Chronic pain    Diabetes mellitus without complication (HCC)    Generalized anxiety disorder    Headache    Hypertension    Schizoaffective disorder [facility])     Past Surgical History:  Procedure Laterality Date  APPENDECTOMY  1970    Family History  Problem Relation Age of Onset   Diabetes Mother    Cancer Mother        unsure what type   Heart disease Mother    Hypertension Mother    Dementia Father    Diabetes Brother    Dementia Maternal Grandfather    Cancer Paternal Grandmother 44       breast   Mental illness Neg Hx     Social History   Socioeconomic History   Marital status: Married    Spouse name: Not on file   Number of children: Not on file   Years of education: Not on file    Highest education level: Not on file  Occupational History   Not on file  Tobacco Use   Smoking status: Every Day    Packs/day: 2.00    Types: Cigarettes   Smokeless tobacco: Never  Vaping Use   Vaping Use: Never used  Substance and Sexual Activity   Alcohol use: Not Currently   Drug use: Yes    Types: Marijuana    Comment: "have changed to hemp now" 09/17/17   Sexual activity: Not on file  Other Topics Concern   Not on file  Social History Narrative   Not on file   Social Determinants of Health   Financial Resource Strain: Not on file  Food Insecurity: Not on file  Transportation Needs: Not on file  Physical Activity: Not on file  Stress: Not on file  Social Connections: Not on file  Intimate Partner Violence: Not on file    Outpatient Medications Prior to Visit  Medication Sig Dispense Refill   Accu-Chek FastClix Lancets MISC Use to check blood sugars three times per day 100 each 11   blood glucose meter kit and supplies KIT Dispense based on patient and insurance preference. Use up to four times daily as directed. ICD-10 E11.65  Z79.4 1 each 0   Blood Glucose Monitoring Suppl (ACCU-CHEK GUIDE ME) w/Device KIT Use to check blood sugars up to 3 times per day. ICD-10 E11.65, Z79.4 1 kit 0   glucose blood (ACCU-CHEK GUIDE) test strip Use as instructed to check blood sugars three times per day. ICD-10 E11.65 and Z79.4 100 each 11   hydrOXYzine (VISTARIL) 50 MG capsule Take 50 mg by mouth daily.     INVEGA SUSTENNA injection Inject 117 mg into the muscle every 28 (twenty-eight) days.     lamoTRIgine (LAMICTAL) 25 MG tablet Take 25 mg by mouth daily.     Lancets Misc. (ACCU-CHEK FASTCLIX LANCET) KIT Use to check blood sugars 3 times per day 1 kit 11   nicotine (NICODERM CQ - DOSED IN MG/24 HOURS) 21 mg/24hr patch Place 21 mg onto the skin daily.     Respiratory Therapy Supplies (FLUTTER) DEVI Use with 4 times daily 1 each 0   Spacer/Aero-Holding Chambers (AEROCHAMBER MV)  inhaler Use as instructed 1 each 0   VENTOLIN HFA 108 (90 Base) MCG/ACT inhaler Inhale 2 puffs into the lungs every 4 (four) hours as needed for wheezing or shortness of breath. 18 g 3   blood glucose meter kit and supplies Dispense based on patient and insurance preference. Use up to four times daily as directed. (FOR ICD-10 E10.9, E11.9). 1 each 0   Dulaglutide (TRULICITY) 1.5 MG/0.5ML SOPN Inject 1.5 mg into the skin once a week. (Patient taking differently: Inject 1.5 mg into the skin every Sunday.) 2 mL 2  gabapentin (NEURONTIN) 300 MG capsule Take one pill twice per day and two at bedtime (Patient taking differently: Take 600 mg by mouth 2 (two) times daily.) 360 capsule 1   JARDIANCE 25 MG TABS tablet Take 1 tablet (25 mg total) by mouth daily. 30 tablet 2   traMADol (ULTRAM) 50 MG tablet Take 1 tablet (50 mg total) by mouth every 6 (six) hours as needed for moderate pain or severe pain. 120 tablet 1   valsartan (DIOVAN) 80 MG tablet Take 1 tablet (80 mg total) by mouth daily. 90 tablet 2   amoxicillin-clavulanate (AUGMENTIN) 875-125 MG tablet Take 1 tablet by mouth See admin instructions. Bid x 10 days (Patient not taking: No sig reported)     diclofenac (VOLTAREN) 50 MG EC tablet TAKE 1 TABLET BY MOUTH TWICE DAILY AS NEEDED FOR MODERATE PAIN. EAT BEFORE TAKING MEDICATION (Patient not taking: Reported on 12/23/2020) 60 tablet 1   divalproex (DEPAKOTE ER) 500 MG 24 hr tablet Take 3 tablets (1,500 mg total) by mouth at bedtime. (Patient not taking: Reported on 12/23/2020) 90 tablet 11   Eszopiclone 3 MG TABS Take 3 mg by mouth at bedtime. Take immediately before bedtime (Patient not taking: Reported on 12/23/2020)     hydrOXYzine (ATARAX/VISTARIL) 50 MG tablet Take 50 mg by mouth 3 (three) times daily.     ipratropium-albuterol (DUONEB) 0.5-2.5 (3) MG/3ML SOLN Take 3 mLs by nebulization every 6 (six) hours. (Patient not taking: Reported on 12/23/2020) 360 mL 0   JANUVIA 100 MG tablet Take 100 mg by  mouth daily. (Patient not taking: Reported on 12/23/2020)     losartan (COZAAR) 50 MG tablet Take 75 mg by mouth daily. (Patient not taking: Reported on 12/23/2020)     meloxicam (MOBIC) 7.5 MG tablet Take 7.5 mg by mouth daily. (Patient not taking: Reported on 12/23/2020)     paliperidone (INVEGA SUSTENNA) 156 MG/ML SUSY injection Inject 1 mL (156 mg total) into the muscle once for 1 dose. Due 5/1 (Patient not taking: Reported on 11/04/2020) 1 mL 11   SUMAtriptan (IMITREX) 50 MG tablet Take 1 tablet (50 mg total) by mouth every 2 (two) hours as needed for migraine. May repeat in 2 hours if headache persists or recurs. (Patient not taking: Reported on 12/23/2020) 20 tablet 0   traZODone (DESYREL) 100 MG tablet Take 200 mg by mouth at bedtime as needed for sleep. (Patient not taking: Reported on 12/23/2020)     Vitamin D, Ergocalciferol, (DRISDOL) 1.25 MG (50000 UNIT) CAPS capsule Take 50,000 Units by mouth. Every Monday and Wednesday (Patient not taking: Reported on 12/23/2020)     No facility-administered medications prior to visit.    Allergies  Allergen Reactions   Haldol [Haloperidol]    Metformin And Related Diarrhea and Nausea And Vomiting   Trazodone And Nefazodone Itching    ROS Review of Systems  Constitutional: Negative.   HENT: Negative.    Eyes: Negative.   Respiratory:  Positive for cough, chest tightness, shortness of breath and wheezing.   Cardiovascular:  Positive for chest pain. Negative for palpitations and leg swelling.  Gastrointestinal: Negative.   Genitourinary: Negative.   Musculoskeletal:  Positive for back pain and gait problem.  Skin: Negative.   Neurological:  Negative for tremors, seizures, syncope, facial asymmetry, speech difficulty, weakness, light-headedness, numbness and headaches.  Psychiatric/Behavioral:  Negative for agitation, behavioral problems, confusion, decreased concentration, dysphoric mood, hallucinations, sleep disturbance and suicidal ideas. The  patient is nervous/anxious. The patient is not hyperactive.  Objective:    Physical Exam Constitutional:      Appearance: Normal appearance. He is normal weight.  HENT:     Head: Normocephalic and atraumatic.     Nose: Nose normal.     Mouth/Throat:     Mouth: Mucous membranes are moist.     Pharynx: Oropharynx is clear.     Comments: Poor dentition Eyes:     Extraocular Movements: Extraocular movements intact.     Conjunctiva/sclera: Conjunctivae normal.     Pupils: Pupils are equal, round, and reactive to light.  Cardiovascular:     Rate and Rhythm: Normal rate and regular rhythm.     Pulses: Normal pulses.     Heart sounds: Normal heart sounds.  Pulmonary:     Effort: No respiratory distress.     Breath sounds: No stridor. Wheezing present. No rhonchi or rales.  Chest:     Chest wall: No tenderness.  Abdominal:     General: Abdomen is flat. Bowel sounds are normal. There is no distension.     Palpations: Abdomen is soft.     Tenderness: There is no abdominal tenderness. There is no left CVA tenderness, guarding or rebound.     Hernia: No hernia is present.  Musculoskeletal:        General: Normal range of motion.     Cervical back: Normal range of motion.  Skin:    General: Skin is warm and dry.     Findings: No lesion.  Neurological:     General: No focal deficit present.     Mental Status: He is alert. Mental status is at baseline.  Psychiatric:        Mood and Affect: Mood normal.        Behavior: Behavior normal.        Thought Content: Thought content normal.        Judgment: Judgment normal.    BP 102/68   Pulse (!) 110   Ht $R'5\' 9"'uN$  (1.753 m)   Wt 225 lb (102.1 kg)   SpO2 92%   BMI 33.23 kg/m  Wt Readings from Last 3 Encounters:  12/23/20 225 lb (102.1 kg)  11/04/20 246 lb (111.6 kg)  07/15/20 246 lb 12.8 oz (111.9 kg)     Health Maintenance Due  Topic Date Due   Fecal DNA (Cologuard)  Never done    There are no preventive care reminders  to display for this patient.  Lab Results  Component Value Date   TSH 1.140 08/28/2018   Lab Results  Component Value Date   WBC 9.8 11/04/2020   HGB 15.1 11/04/2020   HCT 45.3 11/04/2020   MCV 99.8 11/04/2020   PLT 136 (L) 11/04/2020   Lab Results  Component Value Date   NA 135 11/04/2020   K 5.0 11/04/2020   CO2 22 11/04/2020   GLUCOSE 125 (H) 11/04/2020   BUN 27 (H) 11/04/2020   CREATININE 1.90 (H) 11/04/2020   BILITOT 0.7 11/04/2020   ALKPHOS 62 11/04/2020   AST 31 11/04/2020   ALT 31 11/04/2020   PROT 6.5 11/04/2020   ALBUMIN 3.6 11/04/2020   CALCIUM 9.5 11/04/2020   ANIONGAP 11 11/04/2020   Lab Results  Component Value Date   CHOL 144 07/20/2019   Lab Results  Component Value Date   HDL 32 (L) 07/20/2019   Lab Results  Component Value Date   LDLCALC 72 07/20/2019   Lab Results  Component Value Date   TRIG 246 (H)  07/20/2019   Lab Results  Component Value Date   CHOLHDL 4.5 07/20/2019   Lab Results  Component Value Date   HGBA1C 10.4 (A) 06/02/2020      Assessment & Plan:   Problem List Items Addressed This Visit       Cardiovascular and Mediastinum   HTN (hypertension) - Primary    Blood pressure in good control will continue with valsartan as previously prescribed 80 mg daily and discontinue losartan      Relevant Medications   valsartan (DIOVAN) 80 MG tablet   Other Relevant Orders   Comprehensive metabolic panel   Migraine headache without aura    We will refill Imitrex      Relevant Medications   lamoTRIgine (LAMICTAL) 25 MG tablet   diclofenac (VOLTAREN) 50 MG EC tablet   gabapentin (NEURONTIN) 300 MG capsule   SUMAtriptan (IMITREX) 50 MG tablet   valsartan (DIOVAN) 80 MG tablet   traMADol (ULTRAM) 50 MG tablet     Respiratory   COPD with chronic bronchitis (HCC)    COPD with acute bronchitis we will repulsed with azithromycin and refill DuoNebs patient encouraged to reduce tobacco content      Relevant Medications    nicotine (NICODERM CQ - DOSED IN MG/24 HOURS) 21 mg/24hr patch   ipratropium-albuterol (DUONEB) 0.5-2.5 (3) MG/3ML SOLN   azithromycin (ZITHROMAX) 250 MG tablet     Digestive   Dental abscess    Dental abscess has resolved but he has poor dentition and severe periodontal disease I referred him to urgent tooth for consideration of teeth extractions        Endocrine   Type 2 diabetes mellitus with hyperglycemia ([place])    He will A1c at 7.1 in July at the [facility] we will continue Trulicity and Jardiance discontinue Januvia discontinue Lantus follow healthy diet      Relevant Medications   Dulaglutide (TRULICITY) 1.5 WL/8.9HT SOPN (Start on 12/28/2020)   JARDIANCE 25 MG TABS tablet   valsartan (DIOVAN) 80 MG tablet     Nervous and Auditory   Chronic low back pain with left-sided sciatica    Chronic low back pain will continue with tramadol P D MP reviewed      Relevant Medications   hydrOXYzine (VISTARIL) 50 MG capsule   lamoTRIgine (LAMICTAL) 25 MG tablet   INVEGA SUSTENNA injection   nicotine (NICODERM CQ - DOSED IN MG/24 HOURS) 21 mg/24hr patch   diclofenac (VOLTAREN) 50 MG EC tablet   gabapentin (NEURONTIN) 300 MG capsule   traMADol (ULTRAM) 50 MG tablet   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 EAT BEFORE TAKING MEDICATION    Dispense:  60  tablet    Refill:  1   Dulaglutide (TRULICITY) 1.5 IF/0.2DX SOPN    Sig: Inject 1.5 mg into the skin every Sunday.    Dispense:  2 mL    Refill:  4   gabapentin (NEURONTIN) 300 MG capsule    Sig: Take 2 capsules (600 mg total) by mouth 2 (two) times daily.    Dispense:  240 capsule    Refill:  3   ipratropium-albuterol (DUONEB) 0.5-2.5 (3) MG/3ML SOLN    Sig: Take 3 mLs by nebulization every 6 (six) hours.    Dispense:  360 mL    Refill:  0    [employer]   JARDIANCE 25 MG TABS tablet    Sig: Take 1 tablet (25 mg total) by mouth daily.    Dispense:  30 tablet    Refill:  2   SUMAtriptan (IMITREX) 50 MG tablet    Sig: Take 1 tablet (50 mg total) by mouth every 2 (two) hours as needed for migraine. May repeat in 2 hours if headache persists or recurs.    Dispense:  20 tablet    Refill:  0   valsartan (DIOVAN) 80 MG tablet    Sig: Take 1 tablet (80 mg total) by mouth daily.    Dispense:  90 tablet    Refill:  2   traMADol (ULTRAM) 50 MG tablet    Sig: Take 1 tablet (50 mg total) by mouth every 6 (six) hours as needed for moderate pain or severe pain.    Dispense:  120 tablet    Refill:  1   azithromycin (ZITHROMAX) 250 MG tablet    Sig: Take two once then one daily until gone    Dispense:  6 tablet    Refill:  0    Follow-up: Return in about 2 months (around 02/22/2021).    Asencion Noble, MD

## 2020-12-23 NOTE — Assessment & Plan Note (Signed)
We will refill Imitrex

## 2020-12-23 NOTE — Patient Instructions (Signed)
Refills on all your medications sent to the [facility] with exception of the nebulizer medicine this was sent to [facility] outpatient pharmacy Discontinue Lantus  An antibiotic was sent to the [place] azithromycin for 5 days Reduce your tobacco intake Stay on Trulicity and stay on Jardiance as prescribed for your diabetes  Avoid alcohol  Keep your appointments with mental health  Return to see Dr. Joya Gaskins in 2 months

## 2020-12-23 NOTE — Assessment & Plan Note (Signed)
Refill tramadol

## 2020-12-23 NOTE — Assessment & Plan Note (Signed)
Patient has reduced marijuana use

## 2020-12-23 NOTE — Assessment & Plan Note (Signed)
Chronic low back pain will continue with tramadol P D MP reviewed

## 2020-12-23 NOTE — Assessment & Plan Note (Signed)
Blood pressure in good control will continue with valsartan as previously prescribed 80 mg daily and discontinue losartan

## 2020-12-23 NOTE — Assessment & Plan Note (Signed)
He will A1c at 7.1 in July at the mental health hospital we will continue Trulicity and Jardiance discontinue Januvia discontinue Lantus follow healthy diet

## 2020-12-23 NOTE — Assessment & Plan Note (Signed)
COPD with acute bronchitis we will repulsed with azithromycin and refill DuoNebs patient encouraged to reduce tobacco content

## 2020-12-24 ENCOUNTER — Telehealth: Payer: Self-pay

## 2020-12-24 LAB — COMPREHENSIVE METABOLIC PANEL
ALT: 26 IU/L (ref 0–44)
AST: 22 IU/L (ref 0–40)
Albumin/Globulin Ratio: 1.8 (ref 1.2–2.2)
Albumin: 4.6 g/dL (ref 3.8–4.8)
Alkaline Phosphatase: 134 IU/L — ABNORMAL HIGH (ref 44–121)
BUN/Creatinine Ratio: 14 (ref 10–24)
BUN: 19 mg/dL (ref 8–27)
Bilirubin Total: 0.3 mg/dL (ref 0.0–1.2)
CO2: 22 mmol/L (ref 20–29)
Calcium: 10.4 mg/dL — ABNORMAL HIGH (ref 8.6–10.2)
Chloride: 94 mmol/L — ABNORMAL LOW (ref 96–106)
Creatinine, Ser: 1.4 mg/dL — ABNORMAL HIGH (ref 0.76–1.27)
Globulin, Total: 2.5 g/dL (ref 1.5–4.5)
Glucose: 168 mg/dL — ABNORMAL HIGH (ref 65–99)
Potassium: 5.1 mmol/L (ref 3.5–5.2)
Sodium: 133 mmol/L — ABNORMAL LOW (ref 134–144)
Total Protein: 7.1 g/dL (ref 6.0–8.5)
eGFR: 57 mL/min/{1.73_m2} — ABNORMAL LOW (ref >59)

## 2020-12-24 NOTE — Telephone Encounter (Signed)
Contacted pt to go over lab results pt didn't answer lvm   Sent a CRM and forward labs to NT to give pt labs when they call back   

## 2020-12-25 ENCOUNTER — Other Ambulatory Visit (HOSPITAL_COMMUNITY): Payer: Self-pay

## 2021-01-01 DIAGNOSIS — F250 Schizoaffective disorder, bipolar type: Secondary | ICD-10-CM | POA: Diagnosis not present

## 2021-01-19 ENCOUNTER — Other Ambulatory Visit: Payer: Self-pay

## 2021-01-19 ENCOUNTER — Ambulatory Visit: Payer: Medicare Other | Attending: Critical Care Medicine | Admitting: Critical Care Medicine

## 2021-01-19 ENCOUNTER — Encounter: Payer: Self-pay | Admitting: Critical Care Medicine

## 2021-01-19 ENCOUNTER — Ambulatory Visit: Payer: Self-pay

## 2021-01-19 DIAGNOSIS — J0101 Acute recurrent maxillary sinusitis: Secondary | ICD-10-CM | POA: Insufficient documentation

## 2021-01-19 DIAGNOSIS — G43009 Migraine without aura, not intractable, without status migrainosus: Secondary | ICD-10-CM | POA: Diagnosis not present

## 2021-01-19 HISTORY — DX: Acute recurrent maxillary sinusitis: J01.01

## 2021-01-19 MED ORDER — CEFDINIR 300 MG PO CAPS: 600.0000 mg | ORAL_CAPSULE | Freq: Every day | ORAL | 0 refills | Status: AC

## 2021-01-19 MED ORDER — CETIRIZINE HCL 10 MG PO TABS: 10.0000 mg | ORAL_TABLET | Freq: Every day | ORAL | 11 refills | Status: DC

## 2021-01-19 NOTE — Telephone Encounter (Signed)
Patient had virtual with Dr. Joya Gaskins today

## 2021-01-19 NOTE — Progress Notes (Signed)
Established Patient Office Visit  Subjective:  Patient ID: Joshua [name], male    DOB: 06-09-1955  Age: 65 y.o. MRN: [id number] Virtual Visit via Video Note  I connected with Konstantin Lehnen Redmondon 01/19/21 at 11am by a video enabled telemedicine application and verified that I am speaking with the correct person using two identifiers.   Consent:  I discussed the limitations, risks, security and privacy concerns of performing an evaluation and management service by video visit and the availability of in person appointments. I also discussed with the patient that there may be a patient responsible charge related to this service. The patient expressed understanding and agreed to proceed.  Location of patient: Patient's at home on his front porch  Location of provider: I am in my office  Persons participating in the televisit with the patient.   No one else on the call    History of Present Illness:  CC: sinus pain   HPI Joshua [name] presents for symptoms of acute sinusitis to the been going on for 2 weeks.  He complains of increased sinus pressure postnasal drainage difficulty raising secretions.  Symptoms have been progressive.  Past Medical History:  Diagnosis Date   Back pain    Bipolar disorder (HCC)    Cannabis use disorder, moderate, dependence (HCC)    Chronic pain    Diabetes mellitus without complication (HCC)    Generalized anxiety disorder    Headache    Hypertension    Schizoaffective disorder (HCC)     Past Surgical History:  Procedure Laterality Date   APPENDECTOMY  1970    Family History  Problem Relation Age of Onset   Diabetes Mother    Cancer Mother        unsure what type   Heart disease Mother    Hypertension Mother    Dementia Father    Diabetes Brother    Dementia Maternal Grandfather    Cancer Paternal Grandmother 39       breast   Mental illness Neg Hx     Social History   Socioeconomic History   Marital status: Married     Spouse name: Not on file   Number of children: Not on file   Years of education: Not on file   Highest education level: Not on file  Occupational History   Not on file  Tobacco Use   Smoking status: Every Day    Packs/day: 2.00    Types: Cigarettes   Smokeless tobacco: Never  Vaping Use   Vaping Use: Never used  Substance and Sexual Activity   Alcohol use: Not Currently   Drug use: Yes    Types: Marijuana    Comment: "have changed to hemp now" 09/17/17   Sexual activity: Not on file  Other Topics Concern   Not on file  Social History Narrative   Not on file   Social Determinants of Health   Financial Resource Strain: Not on file  Food Insecurity: Not on file  Transportation Needs: Not on file  Physical Activity: Not on file  Stress: Not on file  Social Connections: Not on file  Intimate Partner Violence: Not on file    Outpatient Medications Prior to Visit  Medication Sig Dispense Refill   Accu-Chek FastClix Lancets MISC Use to check blood sugars three times per day 100 each 11   blood glucose meter kit and supplies KIT Dispense based on patient and insurance preference. Use up to four times daily as  directed. ICD-10 E11.65  Z79.4 1 each 0   Blood Glucose Monitoring Suppl (ACCU-CHEK GUIDE ME) w/Device KIT Use to check blood sugars up to 3 times per day. ICD-10 E11.65, Z79.4 1 kit 0   diclofenac (VOLTAREN) 50 MG EC tablet TAKE 1 TABLET BY MOUTH TWICE DAILY AS NEEDED FOR MODERATE PAIN. EAT BEFORE TAKING MEDICATION 60 tablet 1   Dulaglutide (TRULICITY) 1.5 BE/6.7JQ SOPN Inject 1.5 mg into the skin every Sunday. 2 mL 4   gabapentin (NEURONTIN) 300 MG capsule Take 2 capsules (600 mg total) by mouth 2 (two) times daily. 240 capsule 3   glucose blood (ACCU-CHEK GUIDE) test strip Use as instructed to check blood sugars three times per day. ICD-10 E11.65 and Z79.4 100 each 11   hydrOXYzine (VISTARIL) 50 MG capsule Take 50 mg by mouth daily.     INVEGA SUSTENNA injection Inject 117  mg into the muscle every 28 (twenty-eight) days.     ipratropium-albuterol (DUONEB) 0.5-2.5 (3) MG/3ML SOLN Take 3 mLs by nebulization every 6 (six) hours. 360 mL 0   JARDIANCE 25 MG TABS tablet Take 1 tablet (25 mg total) by mouth daily. 30 tablet 2   lamoTRIgine (LAMICTAL) 25 MG tablet Take 25 mg by mouth daily.     Lancets Misc. (ACCU-CHEK FASTCLIX LANCET) KIT Use to check blood sugars 3 times per day 1 kit 11   nicotine (NICODERM CQ - DOSED IN MG/24 HOURS) 21 mg/24hr patch Place 21 mg onto the skin daily.     Respiratory Therapy Supplies (FLUTTER) DEVI Use with 4 times daily 1 each 0   Spacer/Aero-Holding Chambers (AEROCHAMBER MV) inhaler Use as instructed 1 each 0   SUMAtriptan (IMITREX) 50 MG tablet Take 1 tablet (50 mg total) by mouth every 2 (two) hours as needed for migraine. May repeat in 2 hours if headache persists or recurs. 20 tablet 0   traMADol (ULTRAM) 50 MG tablet Take 1 tablet (50 mg total) by mouth every 6 (six) hours as needed for moderate pain or severe pain. 120 tablet 1   valsartan (DIOVAN) 80 MG tablet Take 1 tablet (80 mg total) by mouth daily. 90 tablet 2   VENTOLIN HFA 108 (90 Base) MCG/ACT inhaler Inhale 2 puffs into the lungs every 4 (four) hours as needed for wheezing or shortness of breath. 18 g 3   azithromycin (ZITHROMAX) 250 MG tablet Take two once then one daily until gone 6 tablet 0   No facility-administered medications prior to visit.    Allergies  Allergen Reactions   Haldol [Haloperidol]    Metformin And Related Diarrhea and Nausea And Vomiting   Trazodone And Nefazodone Itching    ROS Review of Systems  Constitutional:  Negative for fatigue and fever.  HENT:  Positive for congestion, postnasal drip, rhinorrhea, sinus pressure and sinus pain. Negative for ear discharge, ear pain, nosebleeds, sore throat and trouble swallowing.   Respiratory:  Positive for cough, shortness of breath and wheezing.   Neurological:  Positive for headaches.      Objective:    Physical Exam No exam this is a video visit There were no vitals taken for this visit. Wt Readings from Last 3 Encounters:  12/23/20 225 lb (102.1 kg)  11/04/20 246 lb (111.6 kg)  07/15/20 246 lb 12.8 oz (111.9 kg)     Health Maintenance Due  Topic Date Due   Fecal DNA (Cologuard)  Never done    There are no preventive care reminders to display for this patient.  Lab Results  Component Value Date   TSH 1.140 08/28/2018   Lab Results  Component Value Date   WBC 9.8 11/04/2020   HGB 15.1 11/04/2020   HCT 45.3 11/04/2020   MCV 99.8 11/04/2020   PLT 136 (L) 11/04/2020   Lab Results  Component Value Date   NA 133 (L) 12/23/2020   K 5.1 12/23/2020   CO2 22 12/23/2020   GLUCOSE 168 (H) 12/23/2020   BUN 19 12/23/2020   CREATININE 1.40 (H) 12/23/2020   BILITOT 0.3 12/23/2020   ALKPHOS 134 (H) 12/23/2020   AST 22 12/23/2020   ALT 26 12/23/2020   PROT 7.1 12/23/2020   ALBUMIN 4.6 12/23/2020   CALCIUM 10.4 (H) 12/23/2020   ANIONGAP 11 11/04/2020   EGFR 57 (L) 12/23/2020   Lab Results  Component Value Date   CHOL 144 07/20/2019   Lab Results  Component Value Date   HDL 32 (L) 07/20/2019   Lab Results  Component Value Date   LDLCALC 72 07/20/2019   Lab Results  Component Value Date   TRIG 246 (H) 07/20/2019   Lab Results  Component Value Date   CHOLHDL 4.5 07/20/2019   Lab Results  Component Value Date   HGBA1C 10.4 (A) 06/02/2020      Assessment & Plan:   Problem List Items Addressed This Visit       Cardiovascular and Mediastinum   Migraine headache without aura    Patient with recurrent migraines plan to refer to neurology      Relevant Orders   Ambulatory referral to Neurology     Respiratory   Acute recurrent maxillary sinusitis - Primary    Acute sinusitis plan to prescribe cefdinir 600 mg daily for 5 days and cetirizine 10 mg daily      Relevant Medications   cefdinir (OMNICEF) 300 MG capsule   cetirizine (ZYRTEC)  10 MG tablet    Meds ordered this encounter  Medications   cefdinir (OMNICEF) 300 MG capsule    Sig: Take 2 capsules (600 mg total) by mouth daily for 5 days.    Dispense:  10 capsule    Refill:  0   cetirizine (ZYRTEC) 10 MG tablet    Sig: Take 1 tablet (10 mg total) by mouth daily.    Dispense:  30 tablet    Refill:  11    Follow Up Instructions: Patient knows to keep regular scheduled follow-up visit   I discussed the assessment and treatment plan with the patient. The patient was provided an opportunity to ask questions and all were answered. The patient agreed with the plan and demonstrated an understanding of the instructions.   The patient was advised to call back or seek an in-person evaluation if the symptoms worsen or if the condition fails to improve as anticipated.  I provided 28 minutes of non-face-to-face time during this encounter  including  median intraservice time , review of notes, labs, imaging, medications  and explaining diagnosis and management to the patient .    Asencion Noble, MD    Asencion Noble, MD

## 2021-01-19 NOTE — Assessment & Plan Note (Signed)
Acute sinusitis plan to prescribe cefdinir 600 mg daily for 5 days and cetirizine 10 mg daily

## 2021-01-19 NOTE — Assessment & Plan Note (Signed)
Patient with recurrent migraines plan to refer to neurology

## 2021-01-19 NOTE — Telephone Encounter (Signed)
Pt c/o severe headache, sinus and facial pain. Pt stated started over a week a go. Pt is having yellow green nasal secretions. Denies fever. Pt has tried Nyquil. Pt stated he needs a virtual visit. No availability at [facility]. Notified FC at [facility] [place]- was advised that there are VV openings at [facility]. Advised FC  [facility] is not able to make appt's at that site at this time.  Care advice given and informed pt's wife that she will receive a call back for a VV.         Reason for Disposition  [1] SEVERE pain AND [2] not improved 2 hours after pain medicine  Answer Assessment - Initial Assessment Questions 1. LOCATION: "Where does it hurt?"      face 2. ONSET: "When did the sinus pain start?"  (e.g., hours, days)      Over a week 3. SEVERITY: "How bad is the pain?"   (Scale 1-10; mild, moderate or severe)   - MILD (1-3): doesn't interfere with normal activities    - MODERATE (4-7): interferes with normal activities (e.g., work or school) or awakens from sleep   - SEVERE (8-10): excruciating pain and patient unable to do any normal activities        severe 4. RECURRENT SYMPTOM: "Have you ever had sinus problems before?" If Yes, ask: "When was the last time?" and "What happened that time?"      Yes-season changes October- 5. NASAL CONGESTION: "Is the nose blocked?" If Yes, ask: "Can you open it or must you breathe through your mouth?"     no 6. NASAL DISCHARGE: "Do you have discharge from your nose?" If so ask, "What color?"     Yes- yellow greenish 7. FEVER: "Do you have a fever?" If Yes, ask: "What is it, how was it measured, and when did it start?"      no 8. OTHER SYMPTOMS: "Do you have any other symptoms?" (e.g., sore throat, cough, earache, difficulty breathing)     no 9. PREGNANCY: "Is there any chance you are pregnant?" "When was your last menstrual period?"     N/a  Protocols used: Sinus Pain or Congestion-A-AH

## 2021-01-20 ENCOUNTER — Other Ambulatory Visit: Payer: Self-pay | Admitting: Critical Care Medicine

## 2021-01-20 DIAGNOSIS — G894 Chronic pain syndrome: Secondary | ICD-10-CM

## 2021-01-20 DIAGNOSIS — M545 Low back pain, unspecified: Secondary | ICD-10-CM

## 2021-01-20 MED ORDER — TRAMADOL HCL 50 MG PO TABS: 50.0000 mg | ORAL_TABLET | Freq: Four times a day (QID) | ORAL | 0 refills | Status: DC | PRN

## 2021-01-21 DIAGNOSIS — F250 Schizoaffective disorder, bipolar type: Secondary | ICD-10-CM | POA: Diagnosis not present

## 2021-02-02 DIAGNOSIS — F250 Schizoaffective disorder, bipolar type: Secondary | ICD-10-CM | POA: Diagnosis not present

## 2021-02-09 ENCOUNTER — Telehealth: Payer: Self-pay | Admitting: Neurology

## 2021-02-09 ENCOUNTER — Encounter: Payer: Self-pay | Admitting: Neurology

## 2021-02-09 NOTE — Telephone Encounter (Signed)
Pt called and scheduled appointment.

## 2021-02-18 ENCOUNTER — Other Ambulatory Visit: Payer: Self-pay | Admitting: Critical Care Medicine

## 2021-02-18 DIAGNOSIS — M5442 Lumbago with sciatica, left side: Secondary | ICD-10-CM

## 2021-02-18 NOTE — Telephone Encounter (Signed)
Requested Prescriptions  Pending Prescriptions Disp Refills  . diclofenac (VOLTAREN) 50 MG EC tablet [Pharmacy Med Name: DICLOFENAC SODIUM 50MG  DR TABLETS] 60 tablet 1    Sig: TAKE 1 TABLET BY MOUTH TWICE DAILY AS NEEDED FOR MODERATE PAIN. EAT BEFORE TAKING THIS MEDICATION     Analgesics:  NSAIDS Failed - 02/18/2021  3:31 AM      Failed - Cr in normal range and within 360 days    Creatinine  Date Value Ref Range Status  09/18/2019 119.5 20.0 - 300.0 mg/dL Final   Creatinine, Ser  Date Value Ref Range Status  12/23/2020 1.40 (H) 0.76 - 1.27 mg/dL Final         Passed - HGB in normal range and within 360 days    Hemoglobin  Date Value Ref Range Status  11/04/2020 15.1 13.0 - 17.0 g/dL Final  07/20/2019 16.0 13.0 - 17.7 g/dL Final         Passed - Patient is not pregnant      Passed - Valid encounter within last 12 months    Recent Outpatient Visits          1 month ago Acute recurrent maxillary sinusitis   [place] Elsie Stain, MD   1 month ago COPD with chronic bronchitis [facility])   [place] Elsie Stain, MD   5 months ago Primary hypertension   [place] Elsie Stain, MD   6 months ago Type 2 diabetes mellitus with hyperglycemia, with long-term current use of insulin [facility])   [place], Jarome Matin, RPH-CPP   7 months ago Type 2 diabetes mellitus with hyperglycemia, with long-term current use of insulin [facility])   [place], MD      Future Appointments            In 5 days Elsie Stain, MD [place]   In 2 months Pieter Partridge, DO [facility]rology [facility]

## 2021-02-22 NOTE — Progress Notes (Signed)
Established Patient Office Visit  Subjective:  Patient ID: Joshua [name], male    DOB: 01-25-1956  Age: 65 y.o. MRN: [id number]  CC:  Chief Complaint  Patient presents with   Sinus Problem    HPI Joshua [name] presents for primary care follow-up.  Overall the patient is doing well with improvement in his sinus complaints.  His breathing is stable.  On arrival however his A1c is 9.1 however is down from 10.7 this summer.  Patient is taking Trulicity 1.5 mg weekly and taking Jardiance 25 mg daily  Patient was treated for sinusitis and this has resolved itself.  Patient's chronic pain is stable on low-dose tramadol.  Patient's anxiety disorder and depression is stable at this time.  He is doing better off the Depakote.  Past Medical History:  Diagnosis Date   Acute recurrent maxillary sinusitis 01/19/2021   Back pain    Bipolar disorder (HCC)    Cannabis use disorder, moderate, dependence (HCC)    Chronic pain    Diabetes mellitus without complication (HCC)    Generalized anxiety disorder    Headache    Hypertension    Schizoaffective disorder ([place])     Past Surgical History:  Procedure Laterality Date   APPENDECTOMY  1970    Family History  Problem Relation Age of Onset   Diabetes Mother    Cancer Mother        unsure what type   Heart disease Mother    Hypertension Mother    Dementia Father    Diabetes Brother    Dementia Maternal Grandfather    Cancer Paternal Grandmother 71       breast   Mental illness Neg Hx     Social History   Socioeconomic History   Marital status: Married    Spouse name: Not on file   Number of children: Not on file   Years of education: Not on file   Highest education level: Not on file  Occupational History   Not on file  Tobacco Use   Smoking status: Every Day    Packs/day: 2.00    Types: Cigarettes   Smokeless tobacco: Never  Vaping Use   Vaping Use: Never used  Substance and Sexual Activity   Alcohol use: Not  Currently   Drug use: Yes    Types: Marijuana    Comment: "have changed to hemp now" 09/17/17   Sexual activity: Not on file  Other Topics Concern   Not on file  Social History Narrative   Not on file   Social Determinants of Health   Financial Resource Strain: Not on file  Food Insecurity: Not on file  Transportation Needs: Not on file  Physical Activity: Not on file  Stress: Not on file  Social Connections: Not on file  Intimate Partner Violence: Not on file    Outpatient Medications Prior to Visit  Medication Sig Dispense Refill   Accu-Chek FastClix Lancets MISC Use to check blood sugars three times per day 100 each 11   blood glucose meter kit and supplies KIT Dispense based on patient and insurance preference. Use up to four times daily as directed. ICD-10 E11.65  Z79.4 1 each 0   Blood Glucose Monitoring Suppl (ACCU-CHEK GUIDE ME) w/Device KIT Use to check blood sugars up to 3 times per day. ICD-10 E11.65, Z79.4 1 kit 0   cetirizine (ZYRTEC) 10 MG tablet Take 1 tablet (10 mg total) by mouth daily. 30 tablet 11  Dulaglutide (TRULICITY) 1.5 MG/0.5ML SOPN Inject 1.5 mg into the skin every Sunday. 2 mL 4   glucose blood (ACCU-CHEK GUIDE) test strip Use as instructed to check blood sugars three times per day. ICD-10 E11.65 and Z79.4 100 each 11   hydrOXYzine (VISTARIL) 50 MG capsule Take 50 mg by mouth daily.     INVEGA SUSTENNA injection Inject 117 mg into the muscle every 28 (twenty-eight) days.     lamoTRIgine (LAMICTAL) 25 MG tablet Take 25 mg by mouth daily.     Lancets Misc. (ACCU-CHEK FASTCLIX LANCET) KIT Use to check blood sugars 3 times per day 1 kit 11   nicotine (NICODERM CQ - DOSED IN MG/24 HOURS) 21 mg/24hr patch Place 21 mg onto the skin daily.     Respiratory Therapy Supplies (FLUTTER) DEVI Use with 4 times daily 1 each 0   Spacer/Aero-Holding Chambers (AEROCHAMBER MV) inhaler Use as instructed 1 each 0   VENTOLIN HFA 108 (90 Base) MCG/ACT inhaler Inhale 2 puffs  into the lungs every 4 (four) hours as needed for wheezing or shortness of breath. 18 g 3   diclofenac (VOLTAREN) 50 MG EC tablet TAKE 1 TABLET BY MOUTH TWICE DAILY AS NEEDED FOR MODERATE PAIN. EAT BEFORE TAKING THIS MEDICATION 60 tablet 1   gabapentin (NEURONTIN) 300 MG capsule Take 2 capsules (600 mg total) by mouth 2 (two) times daily. 240 capsule 3   ipratropium-albuterol (DUONEB) 0.5-2.5 (3) MG/3ML SOLN Take 3 mLs by nebulization every 6 (six) hours. 360 mL 0   JARDIANCE 25 MG TABS tablet Take 1 tablet (25 mg total) by mouth daily. 30 tablet 2   SUMAtriptan (IMITREX) 50 MG tablet Take 1 tablet (50 mg total) by mouth every 2 (two) hours as needed for migraine. May repeat in 2 hours if headache persists or recurs. 20 tablet 0   traMADol (ULTRAM) 50 MG tablet Take 1 tablet (50 mg total) by mouth every 6 (six) hours as needed for moderate pain or severe pain. 120 tablet 0   valsartan (DIOVAN) 80 MG tablet Take 1 tablet (80 mg total) by mouth daily. 90 tablet 2   No facility-administered medications prior to visit.    Allergies  Allergen Reactions   Haldol [Haloperidol]    Metformin And Related Diarrhea and Nausea And Vomiting   Trazodone And Nefazodone Itching    ROS Review of Systems  Constitutional: Negative.  Negative for chills, diaphoresis and fever.  HENT: Negative.  Negative for congestion, ear pain, hearing loss, nosebleeds, postnasal drip, rhinorrhea, sinus pressure, sore throat, tinnitus, trouble swallowing and voice change.   Eyes: Negative.  Negative for photophobia and redness.  Respiratory: Negative.  Negative for apnea, cough, choking, chest tightness, shortness of breath, wheezing and stridor.   Cardiovascular: Negative.  Negative for chest pain, palpitations and leg swelling.  Gastrointestinal: Negative.  Negative for abdominal distention, abdominal pain, blood in stool, constipation, diarrhea, nausea and vomiting.  Endocrine: Negative for polydipsia.  Genitourinary:  Negative.  Negative for dysuria, flank pain, frequency, hematuria and urgency.  Musculoskeletal: Negative.  Negative for arthralgias, back pain, myalgias and neck pain.  Skin: Negative.  Negative for rash.  Allergic/Immunologic: Negative.  Negative for environmental allergies and food allergies.  Neurological: Negative.  Negative for dizziness, tremors, seizures, syncope, weakness and headaches.  Hematological: Negative.  Negative for adenopathy. Does not bruise/bleed easily.  Psychiatric/Behavioral: Negative.  Negative for agitation, dysphoric mood, hallucinations, self-injury, sleep disturbance and suicidal ideas. The patient is not nervous/anxious and is not hyperactive.  Objective:    Physical Exam Vitals reviewed.  Constitutional:      Appearance: Normal appearance. He is well-developed. He is not diaphoretic.  HENT:     Head: Normocephalic and atraumatic.     Nose: No nasal deformity, septal deviation, mucosal edema or rhinorrhea.     Right Sinus: No maxillary sinus tenderness or frontal sinus tenderness.     Left Sinus: No maxillary sinus tenderness or frontal sinus tenderness.     Mouth/Throat:     Pharynx: No oropharyngeal exudate.  Eyes:     General: No scleral icterus.    Conjunctiva/sclera: Conjunctivae normal.     Pupils: Pupils are equal, round, and reactive to light.  Neck:     Thyroid: No thyromegaly.     Vascular: No carotid bruit or JVD.     Trachea: Trachea normal. No tracheal tenderness or tracheal deviation.  Cardiovascular:     Rate and Rhythm: Normal rate and regular rhythm.     Chest Wall: PMI is not displaced.     Pulses: Normal pulses. No decreased pulses.     Heart sounds: Normal heart sounds, S1 normal and S2 normal. Heart sounds not distant. No murmur heard. No systolic murmur is present.  No diastolic murmur is present.    No friction rub. No gallop. No S3 or S4 sounds.  Pulmonary:     Effort: No tachypnea, accessory muscle usage or respiratory  distress.     Breath sounds: No stridor. No decreased breath sounds, wheezing, rhonchi or rales.  Chest:     Chest wall: No tenderness.  Abdominal:     General: Bowel sounds are normal. There is no distension.     Palpations: Abdomen is soft. Abdomen is not rigid.     Tenderness: There is no abdominal tenderness. There is no guarding or rebound.  Musculoskeletal:        General: Normal range of motion.     Cervical back: Normal range of motion and neck supple. No edema, erythema or rigidity. No muscular tenderness. Normal range of motion.  Lymphadenopathy:     Head:     Right side of head: No submental or submandibular adenopathy.     Left side of head: No submental or submandibular adenopathy.     Cervical: No cervical adenopathy.  Skin:    General: Skin is warm and dry.     Coloration: Skin is not pale.     Findings: No rash.     Nails: There is no clubbing.  Neurological:     Mental Status: He is alert and oriented to person, place, and time.     Sensory: No sensory deficit.  Psychiatric:        Speech: Speech normal.        Behavior: Behavior normal.    BP 122/82   Pulse 89   Resp 16   Wt 229 lb 3.2 oz (104 kg)   SpO2 98%   BMI 33.85 kg/m  Wt Readings from Last 3 Encounters:  02/23/21 229 lb 3.2 oz (104 kg)  12/23/20 225 lb (102.1 kg)  11/04/20 246 lb (111.6 kg)     Health Maintenance Due  Topic Date Due   Fecal DNA (Cologuard)  Never done    There are no preventive care reminders to display for this patient.  Lab Results  Component Value Date   TSH 1.140 08/28/2018   Lab Results  Component Value Date   WBC 9.8 11/04/2020   HGB 15.1 11/04/2020  HCT 45.3 11/04/2020   MCV 99.8 11/04/2020   PLT 136 (L) 11/04/2020   Lab Results  Component Value Date   NA 133 (L) 12/23/2020   K 5.1 12/23/2020   CO2 22 12/23/2020   GLUCOSE 168 (H) 12/23/2020   BUN 19 12/23/2020   CREATININE 1.40 (H) 12/23/2020   BILITOT 0.3 12/23/2020   ALKPHOS 134 (H) 12/23/2020    AST 22 12/23/2020   ALT 26 12/23/2020   PROT 7.1 12/23/2020   ALBUMIN 4.6 12/23/2020   CALCIUM 10.4 (H) 12/23/2020   ANIONGAP 11 11/04/2020   EGFR 57 (L) 12/23/2020   Lab Results  Component Value Date   CHOL 144 07/20/2019   Lab Results  Component Value Date   HDL 32 (L) 07/20/2019   Lab Results  Component Value Date   LDLCALC 72 07/20/2019   Lab Results  Component Value Date   TRIG 246 (H) 07/20/2019   Lab Results  Component Value Date   CHOLHDL 4.5 07/20/2019   Lab Results  Component Value Date   HGBA1C 9.1 (A) 02/23/2021      Assessment & Plan:   Problem List Items Addressed This Visit       Cardiovascular and Mediastinum   HTN (hypertension)    Blood pressure under good control no changes made      Relevant Medications   valsartan (DIOVAN) 80 MG tablet   Peripheral vascular disease due to secondary diabetes (HCC)   Relevant Medications   valsartan (DIOVAN) 80 MG tablet   JARDIANCE 25 MG TABS tablet     Respiratory   COPD with chronic bronchitis (HCC)    COPD stable at this time continue inhaled medications      Relevant Medications   ipratropium-albuterol (DUONEB) 0.5-2.5 (3) MG/3ML SOLN   RESOLVED: Acute recurrent maxillary sinusitis    Resolved with therapy observe        Endocrine   Type 2 diabetes mellitus with hyperglycemia (HCC) - Primary    Type 2 diabetes with worsening A1c  Plan to begin Lantus 15 units daily and continue Trulicity and Jardiance we will follow-up with [facility] clinical pharmacy in 2 weeks Dr. Joya Gaskins in 2 months      Relevant Medications   valsartan (DIOVAN) 80 MG tablet   JARDIANCE 25 MG TABS tablet   Other Relevant Orders   POCT glycosylated hemoglobin (Hb A1C) (Completed)     Nervous and Auditory   Chronic low back pain with left-sided sciatica   Relevant Medications   diclofenac (VOLTAREN) 50 MG EC tablet   traMADol (ULTRAM) 50 MG tablet   gabapentin (NEURONTIN) 300 MG capsule     Other   Chronic pain  syndrome    Chronic pain syndrome stable at this time continue tramadol      Relevant Medications   diclofenac (VOLTAREN) 50 MG EC tablet   traMADol (ULTRAM) 50 MG tablet   gabapentin (NEURONTIN) 300 MG capsule   Other Visit Diagnoses     Low back pain with radiation       Relevant Medications   diclofenac (VOLTAREN) 50 MG EC tablet   traMADol (ULTRAM) 50 MG tablet   Need for immunization against influenza       Relevant Orders   Flu Vaccine QUAD 90mo+IM (Fluarix, Fluzone & Alfiuria Quad PF) (Completed)       Meds ordered this encounter  Medications   diclofenac (VOLTAREN) 50 MG EC tablet    Sig: TAKE 1 TABLET BY MOUTH TWICE DAILY AS  NEEDED FOR MODERATE PAIN. EAT BEFORE TAKING THIS MEDICATION    Dispense:  60 tablet    Refill:  1   traMADol (ULTRAM) 50 MG tablet    Sig: Take 1 tablet (50 mg total) by mouth every 6 (six) hours as needed for moderate pain or severe pain.    Dispense:  120 tablet    Refill:  0    For future fill   valsartan (DIOVAN) 80 MG tablet    Sig: Take 1 tablet (80 mg total) by mouth daily.    Dispense:  90 tablet    Refill:  2   JARDIANCE 25 MG TABS tablet    Sig: Take 1 tablet (25 mg total) by mouth daily.    Dispense:  30 tablet    Refill:  2   SUMAtriptan (IMITREX) 50 MG tablet    Sig: Take 1 tablet (50 mg total) by mouth every 2 (two) hours as needed for migraine. May repeat in 2 hours if headache persists or recurs.    Dispense:  20 tablet    Refill:  0   ipratropium-albuterol (DUONEB) 0.5-2.5 (3) MG/3ML SOLN    Sig: Take 3 mLs by nebulization every 6 (six) hours.    Dispense:  360 mL    Refill:  0    [facility], for future refill, pt will call when needed   gabapentin (NEURONTIN) 300 MG capsule    Sig: Take 2 capsules (600 mg total) by mouth 2 (two) times daily.    Dispense:  240 capsule    Refill:  3   Flu vaccine given  Follow-up: Return in about 2 months (around 04/25/2021).    Asencion Noble, MD

## 2021-02-23 ENCOUNTER — Ambulatory Visit: Payer: Medicare Other | Attending: Critical Care Medicine | Admitting: Critical Care Medicine

## 2021-02-23 ENCOUNTER — Other Ambulatory Visit: Payer: Self-pay | Admitting: Pharmacist

## 2021-02-23 ENCOUNTER — Encounter: Payer: Self-pay | Admitting: Critical Care Medicine

## 2021-02-23 ENCOUNTER — Other Ambulatory Visit: Payer: Self-pay

## 2021-02-23 VITALS — BP 122/82 | HR 89 | Resp 16 | Wt 229.2 lb

## 2021-02-23 DIAGNOSIS — I10 Essential (primary) hypertension: Secondary | ICD-10-CM | POA: Diagnosis not present

## 2021-02-23 DIAGNOSIS — E1165 Type 2 diabetes mellitus with hyperglycemia: Secondary | ICD-10-CM | POA: Diagnosis not present

## 2021-02-23 DIAGNOSIS — J449 Chronic obstructive pulmonary disease, unspecified: Secondary | ICD-10-CM | POA: Diagnosis not present

## 2021-02-23 DIAGNOSIS — E1351 Other specified diabetes mellitus with diabetic peripheral angiopathy without gangrene: Secondary | ICD-10-CM

## 2021-02-23 DIAGNOSIS — M545 Low back pain, unspecified: Secondary | ICD-10-CM | POA: Diagnosis not present

## 2021-02-23 DIAGNOSIS — M5442 Lumbago with sciatica, left side: Secondary | ICD-10-CM | POA: Diagnosis not present

## 2021-02-23 DIAGNOSIS — Z23 Encounter for immunization: Secondary | ICD-10-CM

## 2021-02-23 DIAGNOSIS — G894 Chronic pain syndrome: Secondary | ICD-10-CM | POA: Diagnosis not present

## 2021-02-23 DIAGNOSIS — J0101 Acute recurrent maxillary sinusitis: Secondary | ICD-10-CM

## 2021-02-23 DIAGNOSIS — Z794 Long term (current) use of insulin: Secondary | ICD-10-CM

## 2021-02-23 DIAGNOSIS — G8929 Other chronic pain: Secondary | ICD-10-CM | POA: Diagnosis not present

## 2021-02-23 LAB — POCT GLYCOSYLATED HEMOGLOBIN (HGB A1C): HbA1c, POC (controlled diabetic range): 9.1 % — AB (ref 0.0–7.0)

## 2021-02-23 MED ORDER — JARDIANCE 25 MG PO TABS: 25.0000 mg | ORAL_TABLET | Freq: Every day | ORAL | 2 refills | Status: DC

## 2021-02-23 MED ORDER — SUMATRIPTAN SUCCINATE 50 MG PO TABS: 50.0000 mg | ORAL_TABLET | ORAL | 0 refills | Status: DC | PRN

## 2021-02-23 MED ORDER — VALSARTAN 80 MG PO TABS: 80.0000 mg | ORAL_TABLET | Freq: Every day | ORAL | 2 refills | Status: DC

## 2021-02-23 MED ORDER — IPRATROPIUM-ALBUTEROL 0.5-2.5 (3) MG/3ML IN SOLN: 3.0000 mL | Freq: Four times a day (QID) | RESPIRATORY_TRACT | 0 refills | Status: DC

## 2021-02-23 MED ORDER — DICLOFENAC SODIUM 50 MG PO TBEC: DELAYED_RELEASE_TABLET | ORAL | 1 refills | Status: DC

## 2021-02-23 MED ORDER — GABAPENTIN 300 MG PO CAPS: 600.0000 mg | ORAL_CAPSULE | Freq: Two times a day (BID) | ORAL | 3 refills | Status: DC

## 2021-02-23 MED ORDER — LANTUS SOLOSTAR 100 UNIT/ML ~~LOC~~ SOPN: 15.0000 [IU] | PEN_INJECTOR | Freq: Every day | SUBCUTANEOUS | 2 refills | Status: DC

## 2021-02-23 MED ORDER — TRUEPLUS PEN NEEDLES 32G X 4 MM MISC: 2 refills | Status: DC

## 2021-02-23 MED ORDER — TRAMADOL HCL 50 MG PO TABS: 50.0000 mg | ORAL_TABLET | Freq: Four times a day (QID) | ORAL | 0 refills | Status: DC | PRN

## 2021-02-23 NOTE — Assessment & Plan Note (Signed)
COPD stable at this time continue inhaled medications

## 2021-02-23 NOTE — Patient Instructions (Signed)
Begin insulin Lantus 15 units daily this will be sent to your [place] on all your other medications were sent to your [facility] pharmacy without any dose changes  No labs are needed today your A1c is 9.1 so we are adding the Lantus to the Jardiance and Trulicity for your diabetes  Please focus on healthy diet as attached  Return to see Dr. Joya Gaskins in 2 months and [name] who is our clinical pharmacist will need to see you in 2 weeks for your diabetes  Please process a Cologuard kit and send it in  Flu vaccine was given

## 2021-02-23 NOTE — Assessment & Plan Note (Signed)
Resolved with therapy observe

## 2021-02-23 NOTE — Assessment & Plan Note (Signed)
Blood pressure under good control no changes made

## 2021-02-23 NOTE — Assessment & Plan Note (Signed)
Chronic pain syndrome stable at this time continue tramadol

## 2021-02-23 NOTE — Assessment & Plan Note (Addendum)
Type 2 diabetes with worsening A1c  Plan to begin Lantus 15 units daily and continue Trulicity and Jardiance we will follow-up with [facility] in 2 weeks Dr. Joya Gaskins in 2 months

## 2021-02-24 NOTE — Progress Notes (Deleted)
NEUROLOGY CONSULTATION NOTE  CHARLESTON HANKIN MRN: [id number] DOB: 04/21/1956  Referring provider: Asencion Noble, MD Primary care provider: Asencion Noble, MD  Reason for consult:  migraine  Assessment/Plan:   ***   Subjective:  Joshua [name] is a 65 year old male with DM II, HTN, Bipolar disorder, generalized anxiety disorder, alcohol and marijuana abuse and chronic pain syndrome who presents for migraines.  History supplemented by referring provider's note.  Onset:  *** Location:  *** Quality:  *** Intensity:  ***.  *** denies new headache, thunderclap headache or severe headache that wakes *** from sleep. Aura:  *** Prodrome:  *** Postdrome:  *** Associated symptoms:  ***.  *** denies associated unilateral numbness or weakness. Duration:  *** Frequency:  *** Frequency of abortive medication: *** Triggers:  *** Relieving factors:  *** Activity:  ***  Current NSAIDS/analgesics:  diclofenac EC 24m, tramadol Current triptans:  sumatriptan 572mCurrent ergotamine:  *** Current anti-emetic:  *** Current muscle relaxants:  *** Current Antihypertensive medications:  valsartan Current Antidepressant medications:  *** Current Anticonvulsant medications:  lamotrigine 2583maily, gabapentin 600m80mD Current anti-CGRP:  *** Current Vitamins/Herbal/Supplements:  *** Current Antihistamines/Decongestants:  Hydroxyzine, Zyrtec Other therapy:  *** Hormone/birth control:  *** Other medications:  ***  Past NSAIDS/analgesics:  Fioricet, hydrocodone-acetaminophen, ibuprofen, meloxicam Past abortive triptans:  rizatriptan 10mg37mt abortive ergotamine:  none Past muscle relaxants:  none Past anti-emetic:  *** Past antihypertensive medications:  lisinopril, losartan, HCTZ, prazosin Past antidepressant medications:  trazodone Past anticonvulsant medications:  Depakote, topiramate Past anti-CGRP:  none Past vitamins/Herbal/Supplements:  none Past  antihistamines/decongestants:  Flonase Other past therapies:  none  Caffeine:  *** Alcohol:  *** Smoker:  *** Diet:  *** Exercise:  *** Depression:  ***; Anxiety:  *** Other pain:  *** Sleep hygiene:  *** Family history of headache:  ***      PAST MEDICAL HISTORY: Past Medical History:  Diagnosis Date   Acute recurrent maxillary sinusitis 01/19/2021   Back pain    Bipolar disorder (HCC)    Cannabis use disorder, moderate, dependence (HCC)    Chronic pain    Diabetes mellitus without complication (HCC)    Generalized anxiety disorder    Headache    Hypertension    Schizoaffective disorder (HCC) [place] PAST SURGICAL HISTORY: Past Surgical History:  Procedure Laterality Date   APPENDECTOMY  1970    MEDICATIONS: Current Outpatient Medications on File Prior to Visit  Medication Sig Dispense Refill   Accu-Chek FastClix Lancets MISC Use to check blood sugars three times per day 100 each 11   blood glucose meter kit and supplies KIT Dispense based on patient and insurance preference. Use up to four times daily as directed. ICD-10 E11.65  Z79.4 1 each 0   Blood Glucose Monitoring Suppl (ACCU-CHEK GUIDE ME) w/Device KIT Use to check blood sugars up to 3 times per day. ICD-10 E11.65, Z79.4 1 kit 0   cetirizine (ZYRTEC) 10 MG tablet Take 1 tablet (10 mg total) by mouth daily. 30 tablet 11   diclofenac (VOLTAREN) 50 MG EC tablet TAKE 1 TABLET BY MOUTH TWICE DAILY AS NEEDED FOR MODERATE PAIN. EAT BEFORE TAKING THIS MEDICATION 60 tablet 1   Dulaglutide (TRULICITY) 1.5 MG/0.VX/7.9TJ Inject 1.5 mg into the skin every Sunday. 2 mL 4   gabapentin (NEURONTIN) 300 MG capsule Take 2 capsules (600 mg total) by mouth 2 (two) times daily. 240 capsule 3   glucose blood (ACCU-CHEK GUIDE) test strip Use  as instructed to check blood sugars three times per day. ICD-10 E11.65 and Z79.4 100 each 11   hydrOXYzine (VISTARIL) 50 MG capsule Take 50 mg by mouth daily.     insulin glargine (LANTUS SOLOSTAR)  100 UNIT/ML Solostar Pen Inject 15 Units into the skin daily. 15 mL 2   Insulin Pen Needle (TRUEPLUS PEN NEEDLES) 32G X 4 MM MISC Use to inject insulin once a day. 100 each 2   INVEGA SUSTENNA injection Inject 117 mg into the muscle every 28 (twenty-eight) days.     ipratropium-albuterol (DUONEB) 0.5-2.5 (3) MG/3ML SOLN Take 3 mLs by nebulization every 6 (six) hours. 360 mL 0   JARDIANCE 25 MG TABS tablet Take 1 tablet (25 mg total) by mouth daily. 30 tablet 2   lamoTRIgine (LAMICTAL) 25 MG tablet Take 25 mg by mouth daily.     Lancets Misc. (ACCU-CHEK FASTCLIX LANCET) KIT Use to check blood sugars 3 times per day 1 kit 11   nicotine (NICODERM CQ - DOSED IN MG/24 HOURS) 21 mg/24hr patch Place 21 mg onto the skin daily.     Respiratory Therapy Supplies (FLUTTER) DEVI Use with 4 times daily 1 each 0   Spacer/Aero-Holding Chambers (AEROCHAMBER MV) inhaler Use as instructed 1 each 0   SUMAtriptan (IMITREX) 50 MG tablet Take 1 tablet (50 mg total) by mouth every 2 (two) hours as needed for migraine. May repeat in 2 hours if headache persists or recurs. 20 tablet 0   traMADol (ULTRAM) 50 MG tablet Take 1 tablet (50 mg total) by mouth every 6 (six) hours as needed for moderate pain or severe pain. 120 tablet 0   valsartan (DIOVAN) 80 MG tablet Take 1 tablet (80 mg total) by mouth daily. 90 tablet 2   VENTOLIN HFA 108 (90 Base) MCG/ACT inhaler Inhale 2 puffs into the lungs every 4 (four) hours as needed for wheezing or shortness of breath. 18 g 3   No current facility-administered medications on file prior to visit.    ALLERGIES: Allergies  Allergen Reactions   Haldol [Haloperidol]    Metformin And Related Diarrhea and Nausea And Vomiting   Trazodone And Nefazodone Itching    FAMILY HISTORY: Family History  Problem Relation Age of Onset   Diabetes Mother    Cancer Mother        unsure what type   Heart disease Mother    Hypertension Mother    Dementia Father    Diabetes Brother     Dementia Maternal Grandfather    Cancer Paternal Grandmother 71       breast   Mental illness Neg Hx     Objective:  *** General: No acute distress.  Patient appears well-groomed.   Head:  Normocephalic/atraumatic Eyes:  fundi examined but not visualized Neck: supple, no paraspinal tenderness, full range of motion Back: No paraspinal tenderness Heart: regular rate and rhythm Lungs: Clear to auscultation bilaterally. Vascular: No carotid bruits. Neurological Exam: Mental status: alert and oriented to person, place, and time, recent and remote memory intact, fund of knowledge intact, attention and concentration intact, speech fluent and not dysarthric, language intact. Cranial nerves: CN I: not tested CN II: pupils equal, round and reactive to light, visual fields intact CN III, IV, VI:  full range of motion, no nystagmus, no ptosis CN V: facial sensation intact. CN VII: upper and lower face symmetric CN VIII: hearing intact CN IX, X: gag intact, uvula midline CN XI: sternocleidomastoid and trapezius muscles intact CN XII:  tongue midline Bulk & Tone: normal, no fasciculations. Motor:  muscle strength 5/5 throughout Sensation:  Pinprick, temperature and vibratory sensation intact. Deep Tendon Reflexes:  2+ throughout,  toes downgoing.   Finger to nose testing:  Without dysmetria.   Heel to shin:  Without dysmetria.   Gait:  Normal station and stride.  Romberg negative.    Thank you for allowing me to take part in the care of this patient.  Metta Clines, DO  CC: ***

## 2021-02-25 ENCOUNTER — Ambulatory Visit: Payer: Medicare Other | Admitting: Neurology

## 2021-02-26 DIAGNOSIS — F250 Schizoaffective disorder, bipolar type: Secondary | ICD-10-CM | POA: Diagnosis not present

## 2021-03-08 NOTE — Progress Notes (Signed)
PCP: Dr. Joya Gaskins  S:     Patient arrives in good spirits.  Presents for diabetes evaluation, education, and management. Patient was initially referred on 06/02/2020.  Patient was last seen by Primary Care Provider and referred again on 02/23/2021. At that visit, Lantus was started.  Patient reports Diabetes is longstanding.   Family/Social History:  -Current smoker  Human resources officer affordability: Medicare/Medicaid. Patient can not pick up test strips at [facility] because Medicare part B can not be billed. Patient is agreeable to picking up medications at [facility].  Medication adherence reported Missed 10-2 doses lantus .   Current diabetes medications include:  -Lantus 15 units daly -Jardiance 25 mg daily -Trulicity 1.5 mg weekly  Current hypertension medications include:  -Valsartan 80 mg daily Current hyperlipidemia medications include:  -None  Patient denies hypoglycemic events.  Patient reported dietary habits: Eats 1 meal per day. Patient reports weight gain if more than one meal is eaten. Encouraged portion control with meals evenly distributed throughout the day. Reports reducing intake of carbohydrates. States : "I lost more weight when I was counting calories at [employer]." Patient was encouraged to continue counting calories, but advised against [employer]. The plate method was used to explain proper diet.  Patient-reported exercise habits: None currently   Patient reports nocturia (nighttime urination).  Patient denies neuropathy (nerve pain). Patient reports occasional blurred vision. Patient reports self foot exams.     O:   Lab Results  Component Value Date   HGBA1C 9.1 (A) 02/23/2021   There were no vitals filed for this visit.  Lipid Panel     Component Value Date/Time   CHOL 144 07/20/2019 1116   TRIG 246 (H) 07/20/2019 1116   HDL 32 (L) 07/20/2019 1116   CHOLHDL 4.5 07/20/2019 1116   CHOLHDL 3.3 09/30/2016 0651   VLDL 35  09/30/2016 0651   LDLCALC 72 07/20/2019 1116   POCT blood glucose in clinic is 247  Patient does not check blood glucose at home. Reports having no strips. Will send in a prescription for more test strips  Clinical Atherosclerotic Cardiovascular Disease (ASCVD): No  The ASCVD Risk score (Arnett DK, et al., 2019) failed to calculate for the following reasons:   The valid total cholesterol range is 130 to 320 mg/dL   A/P: Diabetes longstanding and currently not at goal. Patient is able to verbalize appropriate hypoglycemia management plan. Patient reports 2 missed doses of insulin in the last 2 weeks and adherence to all other medications. Control is suboptimal due to inadequate dosing and diet. -Continued basal insulin Lantus (insulin glargine) 15 units daily -Increased Trulicity (dulaglutide) to 3 mg once weekly -Continued Jardiance 25 mg daily -Return in 2 weeks for reevaluation of glycemic control after new meter and medication changes  -Extensively discussed pathophysiology of diabetes, recommended lifestyle interventions, dietary effects on blood sugar control -Counseled on s/sx of and management of hypoglycemia -Next A1C anticipated 05/26/2020.   ASCVD risk - primary prevention in patient with diabetes. Last LDL is controlled. ASCVD risk score could not be calculated due to no recent lipid panel. Moderate intensity statin indicated.  -Obtain lipid panel next visit and initiate moderate intensity statin for primary prevention with diabetes   Written patient instructions provided.  Total time in face to face counseling 34  minutes. Follow up Pharmacist in clinic on 03/24/2021. Follow up with PCP in clinic on 04/28/2020.  Thank you for allowing pharmacy to participate in this patient's care.  Reatha Harps, PharmD PGY1 Pharmacy  Resident 03/09/2021 2:52 PM

## 2021-03-09 ENCOUNTER — Ambulatory Visit: Payer: Medicare Other | Attending: Critical Care Medicine | Admitting: Pharmacist

## 2021-03-09 ENCOUNTER — Other Ambulatory Visit: Payer: Self-pay

## 2021-03-09 DIAGNOSIS — Z794 Long term (current) use of insulin: Secondary | ICD-10-CM

## 2021-03-09 DIAGNOSIS — E1165 Type 2 diabetes mellitus with hyperglycemia: Secondary | ICD-10-CM

## 2021-03-09 MED ORDER — LANTUS SOLOSTAR 100 UNIT/ML ~~LOC~~ SOPN: 15.0000 [IU] | PEN_INJECTOR | Freq: Every day | SUBCUTANEOUS | 2 refills | Status: DC

## 2021-03-09 MED ORDER — ACCU-CHEK GUIDE VI STRP
ORAL_STRIP | 11 refills | Status: DC
Start: 2021-03-09 — End: 2022-02-22

## 2021-03-09 MED ORDER — TRULICITY 3 MG/0.5ML ~~LOC~~ SOAJ: 3.0000 mg | SUBCUTANEOUS | 2 refills | Status: DC

## 2021-03-10 ENCOUNTER — Telehealth: Payer: Self-pay | Admitting: Critical Care Medicine

## 2021-03-10 MED ORDER — BASAGLAR KWIKPEN 100 UNIT/ML ~~LOC~~ SOPN
15.0000 [IU] | PEN_INJECTOR | Freq: Every day | SUBCUTANEOUS | 2 refills | Status: DC
Start: 2021-03-10 — End: 2021-03-24

## 2021-03-10 NOTE — Telephone Encounter (Signed)
Pts wife, Abigail Butts, calling stating that the pt is not able to receive the prescription for Lantus due to his insurance not covering this prescription. She states that basaglar pens are what was covered for pt before. Please advise .      [id number] - [place] [address], [address]  [place] [address]  Phone: [phone] Fax: [phone]  Hours: Open 24 hours

## 2021-03-10 NOTE — Telephone Encounter (Signed)
Will send to provider for prescription change

## 2021-03-10 NOTE — Telephone Encounter (Signed)
See encounter

## 2021-03-10 NOTE — Telephone Encounter (Signed)
Insulin dose changed to basaglar

## 2021-03-11 NOTE — Telephone Encounter (Signed)
Called pt/wife and let them know that it was changed.

## 2021-03-23 NOTE — Progress Notes (Signed)
    S:     No chief complaint on file.   Patient arrives in good spirits.  Presents for diabetes evaluation, education, and management. Patient was initially referred on 06/02/2020.  Patient was last seen by Primary Care Provider and referred again on 02/23/2021, at which time Lantus was started. Patient  last seen by Pharmacy on 03/09/2021 at which time Trulicity was increased. Patient called after that visit to request swap from Lantus to [employer] for insurance coverage.  Patient reports Diabetes is longstanding.  Family/Social History:  -Current smoker  Human resources officer affordability: Medicare/Medicaid  Medication adherence reported.   Current diabetes medications include:  -Insulin glargine 15 units QD -Trulicity 3 mg weekly -Jardiance 25 mg QD  Patient not taking metformin due to nausea and diarrhea while taking  Current hypertension medications include:  -Valsartan 80 mg QD  Current hyperlipidemia medications include: none  Patient denies hypoglycemic events.  Patient reported dietary habits: Eats 2 meals/day  Patient-reported exercise habits: none   Patient reports nocturia (nighttime urination).  Patient denies neuropathy (nerve pain). Patient reports visual changes. Patient reports self foot exams.     O:  Physical Exam  ROS  Lab Results  Component Value Date   HGBA1C 9.1 (A) 02/23/2021   There were no vitals filed for this visit.  Lipid Panel     Component Value Date/Time   CHOL 144 07/20/2019 1116   TRIG 246 (H) 07/20/2019 1116   HDL 32 (L) 07/20/2019 1116   CHOLHDL 4.5 07/20/2019 1116   CHOLHDL 3.3 09/30/2016 0651   VLDL 35 09/30/2016 0651   LDLCALC 72 07/20/2019 1116   Brings his meter Home fasting blood sugars: 170s - 240s. No readings >260.    Clinical Atherosclerotic Cardiovascular Disease (ASCVD): No  The ASCVD Risk score (Arnett DK, et al., 2019) failed to calculate for the following reasons:   The valid total  cholesterol range is 130 to 320 mg/dL    A/P: Diabetes longstanding and currently not at goal. Patient is able to verbalize appropriate hypoglycemia management plan. Patient reports 2 missed doses of insulin in the last 2 weeks and adherence to all other medications. Control is suboptimal due to inadequate dosing and diet. -Increased basal insulin Lantus (insulin glargine) to 20 units daily. Pt instructed to increase to 25 units after 1 week if fasting CBGs remain >371.  -Continue Trulicity (dulaglutide) 3 mg once weekly -Continued Jardiance 25 mg daily -Return in 2 weeks for reevaluation of glycemic control after new meter and medication changes -Extensively discussed pathophysiology of diabetes, recommended lifestyle interventions, dietary effects on blood sugar control -Counseled on s/sx of and management of hypoglycemia -Next A1C anticipated 04/2021.   Written patient instructions provided.  Total time in face to face counseling 30 minutes.   Follow up Pharmacist Clinic Visit in 3 weeks.     Benard Halsted, PharmD, [facility] [place], [place] [phone]

## 2021-03-24 ENCOUNTER — Ambulatory Visit: Payer: Medicare Other | Attending: Family Medicine | Admitting: Pharmacist

## 2021-03-24 ENCOUNTER — Other Ambulatory Visit: Payer: Self-pay

## 2021-03-24 DIAGNOSIS — E1165 Type 2 diabetes mellitus with hyperglycemia: Secondary | ICD-10-CM | POA: Diagnosis not present

## 2021-03-24 DIAGNOSIS — Z794 Long term (current) use of insulin: Secondary | ICD-10-CM | POA: Diagnosis not present

## 2021-03-24 LAB — GLUCOSE, POCT (MANUAL RESULT ENTRY): POC Glucose: 268 mg/dl — AB (ref 70–99)

## 2021-03-24 MED ORDER — BASAGLAR KWIKPEN 100 UNIT/ML ~~LOC~~ SOPN: 20.0000 [IU] | PEN_INJECTOR | Freq: Every day | SUBCUTANEOUS | 2 refills | Status: DC

## 2021-03-24 NOTE — Patient Instructions (Signed)
Thank you for coming to see me today. Please do the following:  Start taking Basaglar 20 units in the morning starting tomorrow.  After 1 week if your morning sugars are above 130, increase to 25 units every day.  Continue Jardiance.  Continue Trulicity.  Continue checking blood sugars at home. Continue making the lifestyle changes we've discussed together during our visit. Diet and exercise play a significant role in improving your blood sugars.  Follow-up with me 3 weeks.

## 2021-03-26 DIAGNOSIS — F250 Schizoaffective disorder, bipolar type: Secondary | ICD-10-CM | POA: Diagnosis not present

## 2021-04-02 DIAGNOSIS — F250 Schizoaffective disorder, bipolar type: Secondary | ICD-10-CM | POA: Diagnosis not present

## 2021-04-02 DIAGNOSIS — F17200 Nicotine dependence, unspecified, uncomplicated: Secondary | ICD-10-CM | POA: Diagnosis not present

## 2021-04-14 ENCOUNTER — Telehealth: Payer: Self-pay | Admitting: *Deleted

## 2021-04-14 ENCOUNTER — Ambulatory Visit: Payer: Medicare Other | Admitting: Pharmacist

## 2021-04-14 NOTE — Telephone Encounter (Signed)
Patient has an appointment with Dr. Joya Gaskins scheduled on 04/28/2021. Advised him that it is okay to keep that appointment.

## 2021-04-14 NOTE — Telephone Encounter (Signed)
Copied from [place] [id number]. Topic: Quick Communication - Appointment Cancellation >> Apr 14, 2021  8:23 AM Celene Kras wrote: Patient called to cancel appointment scheduled for 04/14/21 due to being sick and having a fever. Patient has not rescheduled their appointment, but pt is requesting to have a call to discuss things with [facility]. Please advise.   Route to department's PEC pool.

## 2021-04-21 ENCOUNTER — Other Ambulatory Visit: Payer: Self-pay | Admitting: Critical Care Medicine

## 2021-04-21 DIAGNOSIS — M5442 Lumbago with sciatica, left side: Secondary | ICD-10-CM

## 2021-04-26 NOTE — Progress Notes (Signed)
Established Patient Office Visit  Subjective:  Patient ID: Joshua [name], male    DOB: 1955/07/26  Age: 66 y.o. MRN: [id number]  CC:  Chief Complaint  Patient presents with   Diabetes    HPI Joshua [name] presents for primary care follow-up for diabetes and hypertension.  Patient continues to smoke 5 to 6 cigarettes daily at this time.  He has not been compliant with his diet occasionally his fasting sugars are 265-220.  When I review his glucometer his blood sugar ranges anywhere from 1 80-2 10 in the morning fasting.  He self increased his insulin glargine to 30 units a day.  He ran out of refills and cannot get an early refill with this medication.  The patient wishes to hold off on his pneumonia vaccine at this visit.  Patient does have a Cologuard kit at home he states he will have this process soon.  Patient is needing a new nebulizer cup and mouthpiece for his nebulizer machine.  He does maintain the Trulicity weekly along with the insulin glargine and the Jardiance 25 mg daily note he cannot tolerate metformin due to side effects.  Patient has a chronic pain management contract that he has been compliant with and needs refills on tramadol.  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   Chronic pain    Diabetes mellitus without complication (HCC)    Generalized anxiety disorder    Headache    Hypertension    Schizoaffective disorder ([place])     Past Surgical History:  Procedure Laterality Date   APPENDECTOMY  1970    Family History  Problem Relation Age of  Onset   Diabetes Mother    Cancer Mother        unsure what type   Heart disease Mother    Hypertension Mother    Dementia Father    Diabetes Brother    Dementia Maternal Grandfather    Cancer Paternal Grandmother 71       breast   Mental illness Neg Hx     Social History   Socioeconomic History   Marital status: Married    Spouse name: Not on file   Number of children: Not on file   Years of education: Not on file   Highest education level: Not on file  Occupational History   Not on file  Tobacco Use   Smoking status: Every Day    Packs/day: 2.00    Types: Cigarettes   Smokeless tobacco: Never  Vaping Use   Vaping Use: Never used  Substance and Sexual Activity   Alcohol use: Not Currently   Drug use: Yes    Types: Marijuana    Comment: "have changed to hemp now" 09/17/17   Sexual activity: Not on file  Other Topics Concern   Not on file  Social History Narrative   Not on file   Social Determinants of Health   Financial Resource Strain: Not on file  Food Insecurity: Not on file  Transportation Needs: Not on file  Physical Activity: Not on file  Stress: Not on  file  Social Connections: Not on file  Intimate Partner Violence: Not on file    Outpatient Medications Prior to Visit  Medication Sig Dispense Refill   Accu-Chek FastClix Lancets MISC Use to check blood sugars three times per day 100 each 11   blood glucose meter kit and supplies KIT Dispense based on patient and insurance preference. Use up to four times daily as directed. ICD-10 E11.65  Z79.4 1 each 0   Blood Glucose Monitoring Suppl (ACCU-CHEK GUIDE ME) w/Device KIT Use to check blood sugars up to 3 times per day. ICD-10 E11.65, Z79.4 1 kit 0   cetirizine (ZYRTEC) 10 MG tablet Take 1 tablet (10 mg total) by mouth daily. 30 tablet 11   gabapentin (NEURONTIN) 300 MG capsule Take 2 capsules (600 mg total) by mouth 2 (two) times daily. 240 capsule 3   glucose blood (ACCU-CHEK GUIDE) test strip Use as  instructed to check blood sugars three times per day. ICD-10 E11.65 and Z79.4 100 each 11   Insulin Pen Needle (TRUEPLUS PEN NEEDLES) 32G X 4 MM MISC Use to inject insulin once a day. 100 each 2   INVEGA SUSTENNA 156 MG/ML SUSY injection Inject into the muscle.     ipratropium-albuterol (DUONEB) 0.5-2.5 (3) MG/3ML SOLN Take 3 mLs by nebulization every 6 (six) hours. 360 mL 0   Lancets Misc. (ACCU-CHEK FASTCLIX LANCET) KIT Use to check blood sugars 3 times per day 1 kit 11   nicotine (NICODERM CQ - DOSED IN MG/24 HOURS) 21 mg/24hr patch Place 21 mg onto the skin daily.     Respiratory Therapy Supplies (FLUTTER) DEVI Use with 4 times daily 1 each 0   Spacer/Aero-Holding Chambers (AEROCHAMBER MV) inhaler Use as instructed 1 each 0   VENTOLIN HFA 108 (90 Base) MCG/ACT inhaler Inhale 2 puffs into the lungs every 4 (four) hours as needed for wheezing or shortness of breath. 18 g 3   diclofenac (VOLTAREN) 50 MG EC tablet TAKE 1 TABLET BY MOUTH TWICE DAILY AS NEEDED FOR MODERATE PAIN. EAT BEFORE TAKING THE MEDICINE 60 tablet 1   Dulaglutide (TRULICITY) 3 CW/2.3JS SOPN Inject 3 mg as directed once a week. 2 mL 2   hydrOXYzine (VISTARIL) 50 MG capsule Take 50 mg by mouth daily.     Insulin Glargine (BASAGLAR KWIKPEN) 100 UNIT/ML Inject 20 Units into the skin daily. May increase to 25 units daily after 1 week if fasting CBGs >130. 12 mL 2   INVEGA SUSTENNA injection Inject 117 mg into the muscle every 28 (twenty-eight) days.     JARDIANCE 25 MG TABS tablet Take 1 tablet (25 mg total) by mouth daily. 30 tablet 2   lamoTRIgine (LAMICTAL) 25 MG tablet Take 25 mg by mouth daily.     SUMAtriptan (IMITREX) 50 MG tablet Take 1 tablet (50 mg total) by mouth every 2 (two) hours as needed for migraine. May repeat in 2 hours if headache persists or recurs. 20 tablet 0   traMADol (ULTRAM) 50 MG tablet Take 1 tablet (50 mg total) by mouth every 6 (six) hours as needed for moderate pain or severe pain. 120 tablet 0    valsartan (DIOVAN) 80 MG tablet Take 1 tablet (80 mg total) by mouth daily. 90 tablet 2   No facility-administered medications prior to visit.    Allergies  Allergen Reactions   Haldol [Haloperidol]    Metformin And Related Diarrhea and Nausea And Vomiting   Trazodone And Nefazodone Itching    ROS Review of  Systems  Constitutional: Negative.   HENT: Negative.  Negative for ear pain, postnasal drip, rhinorrhea, sinus pressure, sore throat, trouble swallowing and voice change.   Eyes: Negative.   Respiratory:  Positive for wheezing. Negative for apnea, cough, choking, chest tightness, shortness of breath and stridor.   Cardiovascular: Negative.  Negative for chest pain, palpitations and leg swelling.  Gastrointestinal: Negative.  Negative for abdominal distention, abdominal pain, nausea and vomiting.  Genitourinary: Negative.   Musculoskeletal:  Positive for back pain. Negative for arthralgias and myalgias.  Skin: Negative.  Negative for rash.  Allergic/Immunologic: Negative.  Negative for environmental allergies and food allergies.  Neurological: Negative.  Negative for dizziness, syncope, weakness and headaches.  Hematological: Negative.  Negative for adenopathy. Does not bruise/bleed easily.  Psychiatric/Behavioral: Negative.  Negative for agitation, dysphoric mood, hallucinations, self-injury, sleep disturbance and suicidal ideas. The patient is not nervous/anxious and is not hyperactive.      Objective:    Physical Exam Vitals reviewed.  Constitutional:      Appearance: Normal appearance. He is well-developed. He is obese. He is not diaphoretic.  HENT:     Head: Normocephalic and atraumatic.     Nose: Nose normal. No nasal deformity, septal deviation, mucosal edema or rhinorrhea.     Right Sinus: No maxillary sinus tenderness or frontal sinus tenderness.     Left Sinus: No maxillary sinus tenderness or frontal sinus tenderness.     Mouth/Throat:     Mouth: Mucous membranes  are moist.     Pharynx: Oropharynx is clear. No oropharyngeal exudate.     Comments: Poor dentition with multiple dental caries Eyes:     General: No scleral icterus.    Conjunctiva/sclera: Conjunctivae normal.     Pupils: Pupils are equal, round, and reactive to light.  Neck:     Thyroid: No thyromegaly.     Vascular: No carotid bruit or JVD.     Trachea: Trachea normal. No tracheal tenderness or tracheal deviation.  Cardiovascular:     Rate and Rhythm: Normal rate and regular rhythm.     Chest Wall: PMI is not displaced.     Pulses: Normal pulses. No decreased pulses.     Heart sounds: Normal heart sounds, S1 normal and S2 normal. Heart sounds not distant. No murmur heard. No systolic murmur is present.  No diastolic murmur is present.    No friction rub. No gallop. No S3 or S4 sounds.  Pulmonary:     Effort: Pulmonary effort is normal. No tachypnea, accessory muscle usage or respiratory distress.     Breath sounds: No stridor. Wheezing present. No decreased breath sounds, rhonchi or rales.  Chest:     Chest wall: No tenderness.  Abdominal:     General: Bowel sounds are normal. There is no distension.     Palpations: Abdomen is soft. Abdomen is not rigid.     Tenderness: There is no abdominal tenderness. There is no guarding or rebound.  Musculoskeletal:        General: Normal range of motion.     Cervical back: Normal range of motion and neck supple. No edema, erythema or rigidity. No muscular tenderness. Normal range of motion.  Lymphadenopathy:     Head:     Right side of head: No submental or submandibular adenopathy.     Left side of head: No submental or submandibular adenopathy.     Cervical: No cervical adenopathy.  Skin:    General: Skin is warm and dry.  Coloration: Skin is not pale.     Findings: No rash.     Nails: There is no clubbing.  Neurological:     Mental Status: He is alert and oriented to person, place, and time.     Sensory: No sensory deficit.   Psychiatric:        Mood and Affect: Mood normal.        Speech: Speech normal.        Behavior: Behavior normal.        Thought Content: Thought content normal.        Judgment: Judgment normal.    BP 126/87    Pulse 90    Resp 16    Wt 235 lb 6.4 oz (106.8 kg)    SpO2 96%    BMI 34.76 kg/m  Wt Readings from Last 3 Encounters:  04/28/21 235 lb 6.4 oz (106.8 kg)  02/23/21 229 lb 3.2 oz (104 kg)  12/23/20 225 lb (102.1 kg)     Health Maintenance Due  Topic Date Due   Fecal DNA (Cologuard)  Never done    There are no preventive care reminders to display for this patient.  Lab Results  Component Value Date   TSH 1.140 08/28/2018   Lab Results  Component Value Date   WBC 9.8 11/04/2020   HGB 15.1 11/04/2020   HCT 45.3 11/04/2020   MCV 99.8 11/04/2020   PLT 136 (L) 11/04/2020   Lab Results  Component Value Date   NA 133 (L) 12/23/2020   K 5.1 12/23/2020   CO2 22 12/23/2020   GLUCOSE 168 (H) 12/23/2020   BUN 19 12/23/2020   CREATININE 1.40 (H) 12/23/2020   BILITOT 0.3 12/23/2020   ALKPHOS 134 (H) 12/23/2020   AST 22 12/23/2020   ALT 26 12/23/2020   PROT 7.1 12/23/2020   ALBUMIN 4.6 12/23/2020   CALCIUM 10.4 (H) 12/23/2020   ANIONGAP 11 11/04/2020   EGFR 57 (L) 12/23/2020   Lab Results  Component Value Date   CHOL 144 07/20/2019   Lab Results  Component Value Date   HDL 32 (L) 07/20/2019   Lab Results  Component Value Date   LDLCALC 72 07/20/2019   Lab Results  Component Value Date   TRIG 246 (H) 07/20/2019   Lab Results  Component Value Date   CHOLHDL 4.5 07/20/2019   Lab Results  Component Value Date   HGBA1C 9.1 (A) 02/23/2021      Assessment & Plan:   Problem List Items Addressed This Visit       Cardiovascular and Mediastinum   HTN (hypertension)    Hypertension well controlled at this time no change in medications refills sent to pharmacy of the valsartan 80 mg daily      Relevant Medications   valsartan (DIOVAN) 80 MG tablet    Migraine headache without aura    Migraine headaches controlled with Imitrex as needed      Relevant Medications   diclofenac (VOLTAREN) 50 MG EC tablet   traMADol (ULTRAM) 50 MG tablet   valsartan (DIOVAN) 80 MG tablet   SUMAtriptan (IMITREX) 50 MG tablet     Respiratory   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   Relevant Orders   For home use only DME Other see comment     Digestive   Dental abscess    Dental abscess has resolved however patient has severe periodontal disease and multiple dental caries patient does not have dental insurance but he does  have Medicaid we will give him resources for dentist that obtain an process Medicaid        Endocrine   Type 2 diabetes mellitus with hyperglycemia ([place])    Type 2 diabetes not yet at goal we discussed nutritional changes for the patient at this visit  Plan is to continue Jardiance 25 mg daily, Trulicity 3 mg weekly, insulin glargine will increase to 30 units daily and have the patient follow-up with clinical pharmacist in 3 weeks      Relevant Medications   Dulaglutide (TRULICITY) 3 NO/6.7EH SOPN   JARDIANCE 25 MG TABS tablet   valsartan (DIOVAN) 80 MG tablet   Insulin Glargine (BASAGLAR KWIKPEN) 100 UNIT/ML   Other Relevant Orders   POCT glucose (manual entry) (Completed)     Nervous and Auditory   Chronic low back pain with left-sided sciatica    Chronic low back pain patient is compliant with pain management contract no changes made      Relevant Medications   INVEGA SUSTENNA 156 MG/ML SUSY injection   diclofenac (VOLTAREN) 50 MG EC tablet   traMADol (ULTRAM) 50 MG tablet     Other   Schizoaffective disorder, bipolar type ([place])    Care per mental health provider no changes      Chronic pain syndrome   Relevant Medications   diclofenac (VOLTAREN) 50 MG EC tablet   traMADol (ULTRAM) 50 MG tablet   Tobacco dependence       Current smoking consumption amount: 5 to 6 cigarettes daily  Dicsussion on  advise to quit smoking and smoking impacts: Cardiovascular lung impacts  Patient's willingness to quit: Does not appear engaged in quitting  Methods to quit smoking discussed: Behavioral modification  Medication management of smoking session drugs discussed: None  Resources provided:  AVS   Setting quit date not established  Follow-up arranged 4 months   Time spent counseling the patient: 3 minutes        Other Visit Diagnoses     Low back pain with radiation       Relevant Medications   diclofenac (VOLTAREN) 50 MG EC tablet   traMADol (ULTRAM) 50 MG tablet   Need for Streptococcus pneumoniae vaccination       Relevant Orders   Pneumococcal conjugate vaccine 20-valent (Completed)       Meds ordered this encounter  Medications   diclofenac (VOLTAREN) 50 MG EC tablet    Sig: TAKE 1 TABLET BY MOUTH TWICE DAILY AS NEEDED FOR MODERATE PAIN. EAT BEFORE TAKING THE MEDICINE    Dispense:  60 tablet    Refill:  1   Dulaglutide (TRULICITY) 3 MC/9.4BS SOPN    Sig: Inject 3 mg as directed once a week.    Dispense:  2 mL    Refill:  2   JARDIANCE 25 MG TABS tablet    Sig: Take 1 tablet (25 mg total) by mouth daily.    Dispense:  30 tablet    Refill:  2   traMADol (ULTRAM) 50 MG tablet    Sig: Take 1 tablet (50 mg total) by mouth every 6 (six) hours as needed for moderate pain or severe pain.    Dispense:  120 tablet    Refill:  0    For future fill   valsartan (DIOVAN) 80 MG tablet    Sig: Take 1 tablet (80 mg total) by mouth daily.    Dispense:  90 tablet    Refill:  2   SUMAtriptan (IMITREX)  50 MG tablet    Sig: Take 1 tablet (50 mg total) by mouth every 2 (two) hours as needed for migraine. May repeat in 2 hours if headache persists or recurs.    Dispense:  20 tablet    Refill:  1   DISCONTD: Insulin Glargine (BASAGLAR KWIKPEN) 100 UNIT/ML    Sig: Inject 30 Units into the skin daily. May increase to 25 units daily after 1 week if fasting CBGs >130.    Dispense:  12  mL    Refill:  2    Fill early dose change   Insulin Glargine (BASAGLAR KWIKPEN) 100 UNIT/ML    Sig: Inject 30 Units into the skin daily.    Dispense:  12 mL    Refill:  2    Fill early dose change    Follow-up: Return in about 3 months (around 07/27/2021).    Asencion Noble, MD

## 2021-04-28 ENCOUNTER — Other Ambulatory Visit: Payer: Self-pay

## 2021-04-28 ENCOUNTER — Ambulatory Visit: Payer: Medicare Other | Attending: Critical Care Medicine | Admitting: Critical Care Medicine

## 2021-04-28 ENCOUNTER — Encounter: Payer: Self-pay | Admitting: Critical Care Medicine

## 2021-04-28 VITALS — BP 126/87 | HR 90 | Resp 16 | Wt 235.4 lb

## 2021-04-28 DIAGNOSIS — F17200 Nicotine dependence, unspecified, uncomplicated: Secondary | ICD-10-CM

## 2021-04-28 DIAGNOSIS — M5442 Lumbago with sciatica, left side: Secondary | ICD-10-CM | POA: Diagnosis not present

## 2021-04-28 DIAGNOSIS — J449 Chronic obstructive pulmonary disease, unspecified: Secondary | ICD-10-CM

## 2021-04-28 DIAGNOSIS — Z23 Encounter for immunization: Secondary | ICD-10-CM

## 2021-04-28 DIAGNOSIS — G894 Chronic pain syndrome: Secondary | ICD-10-CM | POA: Insufficient documentation

## 2021-04-28 DIAGNOSIS — I10 Essential (primary) hypertension: Secondary | ICD-10-CM | POA: Diagnosis not present

## 2021-04-28 DIAGNOSIS — F1290 Cannabis use, unspecified, uncomplicated: Secondary | ICD-10-CM | POA: Diagnosis not present

## 2021-04-28 DIAGNOSIS — Z7984 Long term (current) use of oral hypoglycemic drugs: Secondary | ICD-10-CM | POA: Insufficient documentation

## 2021-04-28 DIAGNOSIS — E1351 Other specified diabetes mellitus with diabetic peripheral angiopathy without gangrene: Secondary | ICD-10-CM | POA: Diagnosis not present

## 2021-04-28 DIAGNOSIS — Z760 Encounter for issue of repeat prescription: Secondary | ICD-10-CM | POA: Insufficient documentation

## 2021-04-28 DIAGNOSIS — Z794 Long term (current) use of insulin: Secondary | ICD-10-CM

## 2021-04-28 DIAGNOSIS — K047 Periapical abscess without sinus: Secondary | ICD-10-CM | POA: Diagnosis not present

## 2021-04-28 DIAGNOSIS — F17210 Nicotine dependence, cigarettes, uncomplicated: Secondary | ICD-10-CM | POA: Insufficient documentation

## 2021-04-28 DIAGNOSIS — G43009 Migraine without aura, not intractable, without status migrainosus: Secondary | ICD-10-CM | POA: Diagnosis not present

## 2021-04-28 DIAGNOSIS — E1165 Type 2 diabetes mellitus with hyperglycemia: Secondary | ICD-10-CM | POA: Diagnosis present

## 2021-04-28 DIAGNOSIS — Z79899 Other long term (current) drug therapy: Secondary | ICD-10-CM | POA: Insufficient documentation

## 2021-04-28 DIAGNOSIS — Z91119 Patient's noncompliance with dietary regimen due to unspecified reason: Secondary | ICD-10-CM | POA: Insufficient documentation

## 2021-04-28 DIAGNOSIS — M545 Low back pain, unspecified: Secondary | ICD-10-CM | POA: Diagnosis not present

## 2021-04-28 DIAGNOSIS — F250 Schizoaffective disorder, bipolar type: Secondary | ICD-10-CM

## 2021-04-28 DIAGNOSIS — G8929 Other chronic pain: Secondary | ICD-10-CM

## 2021-04-28 LAB — GLUCOSE, POCT (MANUAL RESULT ENTRY): POC Glucose: 256 mg/dl — AB (ref 70–99)

## 2021-04-28 MED ORDER — JARDIANCE 25 MG PO TABS: 25.0000 mg | ORAL_TABLET | Freq: Every day | ORAL | 2 refills | Status: DC

## 2021-04-28 MED ORDER — TRAMADOL HCL 50 MG PO TABS: 50.0000 mg | ORAL_TABLET | Freq: Four times a day (QID) | ORAL | 0 refills | Status: DC | PRN

## 2021-04-28 MED ORDER — SUMATRIPTAN SUCCINATE 50 MG PO TABS: 50.0000 mg | ORAL_TABLET | ORAL | 1 refills | Status: DC | PRN

## 2021-04-28 MED ORDER — TRULICITY 3 MG/0.5ML ~~LOC~~ SOAJ: 3.0000 mg | SUBCUTANEOUS | 2 refills | Status: DC

## 2021-04-28 MED ORDER — BASAGLAR KWIKPEN 100 UNIT/ML ~~LOC~~ SOPN: 30.0000 [IU] | PEN_INJECTOR | Freq: Every day | SUBCUTANEOUS | 2 refills | Status: DC

## 2021-04-28 MED ORDER — VALSARTAN 80 MG PO TABS: 80.0000 mg | ORAL_TABLET | Freq: Every day | ORAL | 2 refills | Status: DC

## 2021-04-28 MED ORDER — DICLOFENAC SODIUM 50 MG PO TBEC: DELAYED_RELEASE_TABLET | ORAL | 1 refills | Status: DC

## 2021-04-28 NOTE — Assessment & Plan Note (Signed)
Chronic low back pain patient is compliant with pain management contract no changes made

## 2021-04-28 NOTE — Patient Instructions (Signed)
A resource sheet for dental clinics to take Medicaid was given I recommend 1 of those circled  A pneumonia vaccine was given  Please process your Cologuard kit  Increase insulin glargine to 30 units daily refill sent to your pharmacy  Additional refills on your other medications sent to your pharmacy  We discussed reducing the 5 to 6 cigarettes daily down to lower amounts see attachment  Continue the Trulicity and Jardiance as you are taking  An appointment with [name] our clinical pharmacist will be made in 3 weeks to address your diabetes and your tobacco use and return to see Dr. Joya Gaskins in 3 months  Continue to follow a healthy diet as we discussed

## 2021-04-28 NOTE — Assessment & Plan Note (Signed)
Dental abscess has resolved however patient has severe periodontal disease and multiple dental caries patient does not have dental insurance but he does have Medicaid we will give him resources for dentist that obtain an process Medicaid

## 2021-04-28 NOTE — Assessment & Plan Note (Signed)
Migraine headaches controlled with Imitrex as needed

## 2021-04-28 NOTE — Assessment & Plan Note (Signed)
° ° °•   Current smoking consumption amount: 5 to 6 cigarettes daily   Dicsussion on advise to quit smoking and smoking impacts: Cardiovascular lung impacts   Patient's willingness to quit: Does not appear engaged in quitting   Methods to quit smoking discussed: Behavioral modification   Medication management of smoking session drugs discussed: None   Resources provided:  AVS    Setting quit date not established   Follow-up arranged 4 months   Time spent counseling the patient: 3 minutes

## 2021-04-28 NOTE — Assessment & Plan Note (Signed)
Hypertension well controlled at this time no change in medications refills sent to pharmacy of the valsartan 80 mg daily

## 2021-04-28 NOTE — Assessment & Plan Note (Signed)
Type 2 diabetes not yet at goal we discussed nutritional changes for the patient at this visit  Plan is to continue Jardiance 25 mg daily, Trulicity 3 mg weekly, insulin glargine will increase to 30 units daily and have the patient follow-up with clinical pharmacist in 3 weeks

## 2021-04-28 NOTE — Assessment & Plan Note (Signed)
Care per mental health provider no changes

## 2021-05-12 ENCOUNTER — Ambulatory Visit: Payer: Medicare Other | Admitting: Neurology

## 2021-05-15 ENCOUNTER — Telehealth: Payer: Self-pay | Admitting: Pharmacist

## 2021-05-15 ENCOUNTER — Telehealth: Payer: Self-pay | Admitting: Critical Care Medicine

## 2021-05-15 ENCOUNTER — Ambulatory Visit: Payer: Self-pay | Admitting: *Deleted

## 2021-05-15 MED ORDER — TRULICITY 1.5 MG/0.5ML ~~LOC~~ SOAJ: 1.5000 mg | SUBCUTANEOUS | 2 refills | Status: DC

## 2021-05-15 MED ORDER — BASAGLAR KWIKPEN 100 UNIT/ML ~~LOC~~ SOPN: 36.0000 [IU] | PEN_INJECTOR | Freq: Every day | SUBCUTANEOUS | 2 refills | Status: DC

## 2021-05-15 NOTE — Telephone Encounter (Signed)
I returned pt's call.   He called in earlier today.  [facility] does not have the Dulaglutide (Trulicity) 3mg /0.5 ml SOPN.   It's on backorder at all their locations.   He is due to take it today.  He is wanting to know what to do.  I left a voicemail for him to call back.

## 2021-05-15 NOTE — Telephone Encounter (Signed)
Luke pls handle this , I am off today

## 2021-05-15 NOTE — Telephone Encounter (Signed)
Reason for Disposition  [1] Caller has URGENT medicine question about med that PCP or specialist prescribed AND [2] triager unable to answer question  Answer Assessment - Initial Assessment Questions 1. NAME of MEDICATION: "What medicine are you calling about?"     Trulicity 3mg /0.71ml 2. QUESTION: "What is your question?" (e.g., double dose of medicine, side effect)     Patient states this dose is on backorder at all [place] locations. They have the decreased dose- but not his dose. Patient is inquiring about new rx vs different pharmacy for supply- but he is worried about his insurance coverage at a different pharmacy 3. PRESCRIBING HCP: "Who prescribed it?" Reason: if prescribed by specialist, call should be referred to that group.     PCP  Protocols used: Medication Question Call-A-AH

## 2021-05-15 NOTE — Telephone Encounter (Signed)
Will send in orders for the 1.5 mg weekly dose of Trulicity. Since we will be losing some glycemic control d/t that, I recommend to increase Lantus to 36 units daily. Prescriptions sent.

## 2021-05-15 NOTE — Telephone Encounter (Signed)
error 

## 2021-05-15 NOTE — Addendum Note (Signed)
Addended by: Daisy Blossom, Annie Main L on: 05/15/2021 05:04 PM   Modules accepted: Orders

## 2021-05-15 NOTE — Telephone Encounter (Signed)
Patient's 3mg  Trulicity is on backorder. Will route to provider to see if going back to the 1.5 mg dose and titrating insulin is appropriate until we get the 3mg  dose back in stock.

## 2021-05-20 ENCOUNTER — Ambulatory Visit: Payer: Medicare Other | Attending: Critical Care Medicine | Admitting: Pharmacist

## 2021-05-20 ENCOUNTER — Other Ambulatory Visit: Payer: Self-pay

## 2021-05-20 DIAGNOSIS — Z794 Long term (current) use of insulin: Secondary | ICD-10-CM | POA: Diagnosis not present

## 2021-05-20 DIAGNOSIS — E1165 Type 2 diabetes mellitus with hyperglycemia: Secondary | ICD-10-CM

## 2021-05-20 NOTE — Progress Notes (Signed)
Patient visit virtually in the context of Covid-19 pandemic and d/t lack of clinic visits as our clinic is moving locations this week.  I connected with Joshua [name] on 05/20/2021 at 10:30 AM by video and verified that I am speaking with the correct person using two identifiers.  I discussed the limitations, risks, security and privacy concerns of performing an evaluation and management service by video and the availability of in person appointments. I also discussed with the patient that there may be a patient responsible charge related to this service. The patient expressed understanding and agreed to proceed.     Patient location:  home  My Location:  home office   Persons on the video call:  myself and the patient     S:    PCP: Dr. Joya Gaskins   No chief complaint on file.  Patient arrives in good spirits.  Presents for diabetes evaluation, education, and management. Patient was last seen by Primary Care Provider 04/28/2021. At that visit, he reported self-increasing his Basaglar dose to 30 units without obtaining a new rx. Unfortunately, this left him without insulin for some time.   Today, pt reports Diabetes is longstanding. He was previously on Trulicity 3mg /weekly. There have been backorders/supply issues with the higher doses of Trulicity and he has been without for several weeks. I had a message sent to me last week explaining this and I recommended him to stay on Trulicity 1.5 mg weekly and to increase his Basaglar until the higher dose Trulicity pens become available. He tells me today that he never picked up the 1.5 mg pens and he has been taking 30u of Basaglar instead of the 36u daily that I prescribed for him last week.   Family/Social History:  -Current smoker -Denies any alcohol use  Insurance coverage/medication affordability: Medicare/Medicaid  Medication adherence reported.   Current diabetes medications include:  -Insulin glargine 30 units QD -Trulicity 1.5 mg  weekly (not taking) -Jardiance 25 mg QD  Current hypertension medications include:  -Valsartan 80 mg QD  Current hyperlipidemia medications include: none  Patient denies hypoglycemic events.  Patient reported dietary habits: Eats 2 meals/day  Patient-reported exercise habits: none   Patient reports nocturia (nighttime urination).  Patient reports neuropathy (nerve pain). Patient reports visual changes. Patient reports self foot exams.     O:  Physical Exam  ROS  Lab Results  Component Value Date   HGBA1C 9.1 (A) 02/23/2021   There were no vitals filed for this visit.  Lipid Panel     Component Value Date/Time   CHOL 144 07/20/2019 1116   TRIG 246 (H) 07/20/2019 1116   HDL 32 (L) 07/20/2019 1116   CHOLHDL 4.5 07/20/2019 1116   CHOLHDL 3.3 09/30/2016 0651   VLDL 35 09/30/2016 0651   LDLCALC 72 07/20/2019 1116   No meter with him during the video call. Home fasting blood sugars: Reports 161W-960A while on Trulicity, however, his range has been more in the 170s - 240s since being without it. Tells me his highest level in the past couple of weeks was 249.  Clinical Atherosclerotic Cardiovascular Disease (ASCVD): No  The ASCVD Risk score (Arnett DK, et al., 2019) failed to calculate for the following reasons:   The valid total cholesterol range is 130 to 320 mg/dL    A/P: Diabetes longstanding and currently not at goal. Patient is able to verbalize appropriate hypoglycemia management plan. Will have him resume Trulicity for now at 1.5 mg weekly. Future options could be  to change to Ozempic but we are having supply issues with higher doses of this as well. Additionally, we could use Mounjaro if supply issues with the higher doses of Trulicity become more longstanding. -Increased basal insulin Lantus (insulin glargine) to 36 units daily.  -Resume Trulicity (dulaglutide) 1.5 mg once weekly -Continued Jardiance 25 mg daily -Return in 4 weeks for reevaluation of glycemic  control after new meter and medication changes -Extensively discussed pathophysiology of diabetes, recommended lifestyle interventions, dietary effects on blood sugar control -Counseled on s/sx of and management of hypoglycemia -Next A1C anticipated 04/2021. Will plan to get this when he is in clinic next month.   Written patient instructions provided.  Total time spent in counseling: 20 minutes.   Follow up Pharmacist Clinic Visit in 4 weeks.     Benard Halsted, PharmD, [place], [place] [phone]

## 2021-05-25 ENCOUNTER — Other Ambulatory Visit: Payer: Self-pay | Admitting: Critical Care Medicine

## 2021-05-25 DIAGNOSIS — G894 Chronic pain syndrome: Secondary | ICD-10-CM

## 2021-05-25 DIAGNOSIS — M545 Low back pain, unspecified: Secondary | ICD-10-CM

## 2021-05-25 MED ORDER — TRAMADOL HCL 50 MG PO TABS: 50.0000 mg | ORAL_TABLET | Freq: Four times a day (QID) | ORAL | 0 refills | Status: DC | PRN

## 2021-05-26 ENCOUNTER — Ambulatory Visit: Payer: Medicare Other | Admitting: Neurology

## 2021-06-17 ENCOUNTER — Ambulatory Visit: Payer: Medicare Other | Attending: Critical Care Medicine | Admitting: Pharmacist

## 2021-06-17 DIAGNOSIS — E1165 Type 2 diabetes mellitus with hyperglycemia: Secondary | ICD-10-CM | POA: Insufficient documentation

## 2021-06-17 DIAGNOSIS — Z794 Long term (current) use of insulin: Secondary | ICD-10-CM | POA: Diagnosis not present

## 2021-06-17 LAB — POCT GLYCOSYLATED HEMOGLOBIN (HGB A1C): HbA1c, POC (controlled diabetic range): 10 % — AB (ref 0.0–7.0)

## 2021-06-17 MED ORDER — ACCU-CHEK FASTCLIX LANCETS MISC: 11 refills | Status: DC

## 2021-06-17 MED ORDER — TRUEPLUS PEN NEEDLES 32G X 4 MM MISC: 2 refills | Status: DC

## 2021-06-17 MED ORDER — TRULICITY 1.5 MG/0.5ML ~~LOC~~ SOAJ: 1.5000 mg | SUBCUTANEOUS | 2 refills | Status: DC

## 2021-06-17 MED ORDER — BASAGLAR KWIKPEN 100 UNIT/ML ~~LOC~~ SOPN: 36.0000 [IU] | PEN_INJECTOR | Freq: Every day | SUBCUTANEOUS | 2 refills | Status: DC

## 2021-06-17 NOTE — Progress Notes (Signed)
S:    PCP: Dr. Joya Gaskins   No chief complaint on file.  Patient arrives in good spirits.  Presents for diabetes evaluation, education, and management. Patient was last seen by Primary Care Provider 04/28/2021. I saw him on 05/20/2021 via video visit and had him increase Lantus and resume Trulicity at the 1.5 mg weekly dose.   Today, pt reports diabetes is longstanding. He is back on Trulicity and Lantus but admits to heavy dietary indiscretion. His A1c today is elevated and trending up since checked last year. Additional findings summarized below.   Family/Social History:  -Current smoker -Denies any alcohol use  Insurance coverage/medication affordability: Medicare/Medicaid  Medication adherence reported.   Current diabetes medications include:  -Insulin glargine 36 units QD -Trulicity 1.5 mg weekly -Jardiance 25 mg QD  Current hypertension medications include:  -Valsartan 80 mg QD  Current hyperlipidemia medications include: none  Patient denies hypoglycemic events.  Patient reported dietary habits: Eats 2 meals/day - Heavy consumption of cookies ([employer] cookies), candy bars, oranges and bananas over the last several weeks. His CBG avgs at home correlate with this.   Patient-reported exercise habits: none   Patient reports nocturia (nighttime urination).  Patient reports neuropathy (nerve pain). Patient reports visual changes. Patient reports self foot exams.     O:  Physical Exam  ROS   Lab Results  Component Value Date   HGBA1C 10.0 (A) 06/17/2021   There were no vitals filed for this visit.  Lipid Panel     Component Value Date/Time   CHOL 144 07/20/2019 1116   TRIG 246 (H) 07/20/2019 1116   HDL 32 (L) 07/20/2019 1116   CHOLHDL 4.5 07/20/2019 1116   CHOLHDL 3.3 09/30/2016 0651   VLDL 35 09/30/2016 0651   LDLCALC 72 07/20/2019 1116   Brings his meter with him. 7 day avg: 297 14 day: 263 30 day: 220  90 day: 197   Clinical Atherosclerotic  Cardiovascular Disease (ASCVD): No  The ASCVD Risk score (Arnett DK, et al., 2019) failed to calculate for the following reasons:   The valid total cholesterol range is 130 to 320 mg/dL    A/P: Diabetes longstanding and currently not at goal. A1c today 10%. Patient is able to verbalize appropriate hypoglycemia management plan. He wishes to hold off on changes because he believes he can change his lifestyle to better control his DM. I instructed him to return in 1 month and we must make changes if home CBGs continue above goal. He is amenable to this.  -Continue Lantus 36 units daily.  -Continue Trulicity 1.5 mg once weekly -Continued Jardiance 25 mg dily -Return in 4 weeks for reevaluation of glycemic control after new meter and medication changes -Extensively discussed pathophysiology of diabetes, recommended lifestyle interventions, dietary effects on blood sugar control -Counseled on s/sx of and management of hypoglycemia -Next A1C anticipated 08/2021.  Written patient instructions provided.  Total time spent in counseling: 20 minutes.   Follow up Pharmacist Clinic Visit in 4 weeks.     Benard Halsted, PharmD, [place], [place] [phone]

## 2021-06-19 ENCOUNTER — Other Ambulatory Visit: Payer: Self-pay | Admitting: Critical Care Medicine

## 2021-06-19 DIAGNOSIS — G8929 Other chronic pain: Secondary | ICD-10-CM

## 2021-06-19 DIAGNOSIS — M5442 Lumbago with sciatica, left side: Secondary | ICD-10-CM

## 2021-06-19 NOTE — Telephone Encounter (Signed)
Requested Prescriptions  Pending Prescriptions Disp Refills   diclofenac (VOLTAREN) 50 MG EC tablet [Pharmacy Med Name: DICLOFENAC SODIUM $RemoveBeforeDEI'50MG'[id number]$  DR TABLETS] 60 tablet 1    Sig: TAKE 1 TABLET BY MOUTH TWICE DAILY AS NEEDED FOR MODERATE PAIN. EAT BEFORE TAKING THIS MEDICATION     Analgesics:  NSAIDS Failed - 06/19/2021  4:04 AM      Failed - Manual Review: Labs are only required if the patient has taken medication for more than 8 weeks.      Failed - Cr in normal range and within 360 days    Creatinine  Date Value Ref Range Status  09/18/2019 119.5 20.0 - 300.0 mg/dL Final   Creatinine, Ser  Date Value Ref Range Status  12/23/2020 1.40 (H) 0.76 - 1.27 mg/dL Final         Failed - PLT in normal range and within 360 days    Platelets  Date Value Ref Range Status  11/04/2020 136 (L) 150 - 400 K/uL Final    Comment:    SPECIMEN CHECKED FOR CLOTS REPEATED TO VERIFY   07/20/2019 175 150 - 450 x10E3/uL Final         Passed - HGB in normal range and within 360 days    Hemoglobin  Date Value Ref Range Status  11/04/2020 15.1 13.0 - 17.0 g/dL Final  07/20/2019 16.0 13.0 - 17.7 g/dL Final         Passed - HCT in normal range and within 360 days    HCT  Date Value Ref Range Status  11/04/2020 45.3 39.0 - 52.0 % Final   Hematocrit  Date Value Ref Range Status  07/20/2019 46.8 37.5 - 51.0 % Final         Passed - eGFR is 30 or above and within 360 days    GFR calc Af Amer  Date Value Ref Range Status  03/03/2020 70 >59 mL/min/1.73 Final    Comment:    **In accordance with recommendations from the NKF-ASN Task force,**   [facility] is in the process of updating its eGFR calculation to the   2021 CKD-EPI creatinine equation that estimates kidney function   without a race variable.    GFR, Estimated  Date Value Ref Range Status  11/04/2020 39 (L) >60 mL/min Final    Comment:    (NOTE) Calculated using the CKD-EPI Creatinine Equation (2021)    eGFR  Date Value Ref Range  Status  12/23/2020 57 (L) >59 mL/min/1.73 Final         Passed - Patient is not pregnant      Passed - Valid encounter within last 12 months    Recent Outpatient Visits          2 days ago Type 2 diabetes mellitus with hyperglycemia, with long-term current use of insulin [facility])   [place], Annie Main L, RPH-CPP   1 month ago Type 2 diabetes mellitus with hyperglycemia, with long-term current use of insulin [facility])   [place], Jarome Matin, RPH-CPP   1 month ago COPD with chronic bronchitis [facility])   [place] Elsie Stain, MD   2 months ago Type 2 diabetes mellitus with hyperglycemia, with long-term current use of insulin [facility])   [place], Annie Main L, RPH-CPP   3 months ago Type 2 diabetes mellitus with hyperglycemia, with long-term current use of insulin (  [facility])   [place], RPH-CPP      Future Appointments            In 3 weeks Daisy Blossom, Jarome Matin, [place]   In 1 month Joya Gaskins, Burnett Harry, MD [place]

## 2021-06-24 ENCOUNTER — Other Ambulatory Visit: Payer: Self-pay | Admitting: Critical Care Medicine

## 2021-06-24 DIAGNOSIS — G894 Chronic pain syndrome: Secondary | ICD-10-CM

## 2021-06-24 DIAGNOSIS — M545 Low back pain, unspecified: Secondary | ICD-10-CM

## 2021-06-25 ENCOUNTER — Other Ambulatory Visit: Payer: Self-pay | Admitting: Critical Care Medicine

## 2021-06-25 DIAGNOSIS — G894 Chronic pain syndrome: Secondary | ICD-10-CM

## 2021-06-25 DIAGNOSIS — M545 Low back pain, unspecified: Secondary | ICD-10-CM

## 2021-06-25 MED ORDER — TRAMADOL HCL 50 MG PO TABS: 50.0000 mg | ORAL_TABLET | Freq: Four times a day (QID) | ORAL | 0 refills | Status: DC | PRN

## 2021-06-29 ENCOUNTER — Other Ambulatory Visit: Payer: Self-pay | Admitting: Critical Care Medicine

## 2021-06-29 DIAGNOSIS — M545 Low back pain, unspecified: Secondary | ICD-10-CM

## 2021-06-29 DIAGNOSIS — G894 Chronic pain syndrome: Secondary | ICD-10-CM

## 2021-06-29 NOTE — Telephone Encounter (Signed)
Medication Refill - Medication: traMADol (ULTRAM) 50 MG tablet ?Pt has medication until this week on Wednesday.  ? ?Pt wife stated she reached out to the pharmacy spoke with a live person this morning and they advised her they did not have a refill. ? ?Has the patient contacted their pharmacy? Yes.   No refills.  ? ?(Agent: If yes, when and what did the pharmacy advise?) ?[id number] - [place], [address]  ?[address] [place] [address]  ?Phone: [phone] Fax: [phone]  ?Hours: Open 24 hours  ? ? ?Preferred Pharmacy (with phone number or street name):  ?Has the patient been seen for an appointment in the last year OR does the patient have an upcoming appointment? Yes.   ? ?Agent: Please be advised that RX refills may take up to 3 business days. We ask that you follow-up with your pharmacy.  ?

## 2021-06-30 NOTE — Telephone Encounter (Signed)
Requested medication (s) are due for refill today: signed 06/25/21 ? ?Requested medication (s) are on the active medication list: yes ? ?Last refill:  06/25/21 #120 0 refills ? ?Future visit scheduled: yes in 1 month ? ?Notes to clinic:  not delegated per protocol. Class print noted. Do you want to refill Rx? ? ? ?  ?Requested Prescriptions  ?Pending Prescriptions Disp Refills  ? traMADol (ULTRAM) 50 MG tablet 120 tablet 0  ?  Sig: Take 1 tablet (50 mg total) by mouth every 6 (six) hours as needed for moderate pain or severe pain.  ?  ? Not Delegated - Analgesics:  Opioid Agonists Failed - 06/30/2021 11:30 AM  ?  ?  Failed - This refill cannot be delegated  ?  ?  Passed - Urine Drug Screen completed in last 360 days  ?  ?  Passed - Valid encounter within last 3 months  ?  Recent Outpatient Visits   ? ?      ? 1 week ago Type 2 diabetes mellitus with hyperglycemia, with long-term current use of insulin ([place])  ? [place], RPH-CPP  ? 1 month ago Type 2 diabetes mellitus with hyperglycemia, with long-term current use of insulin ([place])  ? [place], RPH-CPP  ? 2 months ago COPD with chronic bronchitis ([place])  ? [place] Elsie Stain, MD  ? 3 months ago Type 2 diabetes mellitus with hyperglycemia, with long-term current use of insulin ([place])  ? [place], RPH-CPP  ? 3 months ago Type 2 diabetes mellitus with hyperglycemia, with long-term current use of insulin ([place])  ? [place], RPH-CPP  ? ?  ?  ?Future Appointments   ? ?        ? In 2 weeks Daisy Blossom, Jarome Matin, [place]  ? In 1 month Joya Gaskins Burnett Harry, MD [place]  ? ?  ? ?  ?  ?  ? ?

## 2021-07-01 ENCOUNTER — Ambulatory Visit: Payer: Self-pay | Admitting: *Deleted

## 2021-07-01 DIAGNOSIS — G894 Chronic pain syndrome: Secondary | ICD-10-CM

## 2021-07-01 DIAGNOSIS — M545 Low back pain, unspecified: Secondary | ICD-10-CM

## 2021-07-01 MED ORDER — TRAMADOL HCL 50 MG PO TABS: 50.0000 mg | ORAL_TABLET | Freq: Four times a day (QID) | ORAL | 0 refills | Status: DC | PRN

## 2021-07-01 NOTE — Telephone Encounter (Signed)
I returned the call.  Joshua [name], wife called in regarding his Tramadol 50 mg.  The pharmacy never received a refill request.  I t was sent on 06/25/2021 as a "print" instead of electronically.  On 06/25/2021 the refill was denied.   Pt is out of Tramadol and is requesting it be filled today.   [facility] [phone]  [phone];  Fax [phone]. ? ?I left a voicemail to call back. ? ? ?

## 2021-07-01 NOTE — Telephone Encounter (Signed)
Sent again tramadol ?

## 2021-07-01 NOTE — Telephone Encounter (Signed)
Rx sent today. Pls see medication tab.  ? ?

## 2021-07-01 NOTE — Telephone Encounter (Signed)
Request for the Tramadol being forwarded to [place]. ?

## 2021-07-01 NOTE — Telephone Encounter (Signed)
Pt's spouse Abigail Butts called in to follow up on refill request. Advised of current status (awaiting for provider) Spouse says that the pharmacy is apparently having issues with getting Tramadol Rx.  ? ?Spouse would like to pick up instead ?

## 2021-07-01 NOTE — Addendum Note (Signed)
Addended by: Asencion Noble E on: 07/01/2021 12:52 PM ? ? Modules accepted: Orders ? ?

## 2021-07-06 ENCOUNTER — Ambulatory Visit: Payer: Self-pay | Admitting: *Deleted

## 2021-07-06 NOTE — Telephone Encounter (Signed)
?  Chief Complaint: skin injury ?Symptoms: red area ?Frequency: today ?Pertinent Negatives: Patient denies pain, bleeding ?Disposition: '[]'$ ED /'[x]'$ Urgent Care (no appt availability in office) / '[]'$ Appointment(In office/virtual)/ '[]'$  [place] [facility]/ '[]'$ Home Care/ '[]'$ Refused Recommended Disposition /'[]'$ [place] [facility]/ '[]'$  Follow-up with PCP ?Additional Notes: Advised UC- no open appointment in office  ?

## 2021-07-06 NOTE — Telephone Encounter (Signed)
Patient is calling to get referral. Patient got hair wrapped around his penis. Patient jerked hair- it got tight and broke. Patient has red ring and it is infected.  ?Reason for Disposition ? [1] Looks infected (spreading redness, pus) AND [2] no fever ? ?Answer Assessment - Initial Assessment Questions ?1. APPEARANCE of INJURY: "What does the injury look like?"  ?    Red ring around penis ?2. SIZE: "How large is the cut?"  ?    No cut ?3. BLEEDING: "Is it bleeding now?" If Yes, ask: "Is it difficult to stop?"  ?    No bleeding ?4. LOCATION: "Where is the injury located?"  ?    Shaft of penis near head of penis ?5. ONSET: "How long ago did the injury occur?"  ?    today ?6. MECHANISM: "Tell me how it happened."  ?    Long hair wrapped around penis- patient pulled the hair and it got tight before breaking ?7. TETANUS: "When was the last tetanus booster?" ?      ?8. PREGNANCY: "Is there any chance you are pregnant?" "When was your last menstrual period?" ? ?Protocols used: Skin Injury-A-AH ? ?

## 2021-07-07 ENCOUNTER — Ambulatory Visit (HOSPITAL_COMMUNITY): Payer: Medicare Other

## 2021-07-07 NOTE — Telephone Encounter (Signed)
Left message on voicemail to return call.  ?Attempt to schedule patient for appt on 07/08/2021 at [facility] or on [facility].  ?

## 2021-07-09 ENCOUNTER — Encounter: Payer: Self-pay | Admitting: Family

## 2021-07-09 ENCOUNTER — Other Ambulatory Visit: Payer: Self-pay

## 2021-07-09 ENCOUNTER — Ambulatory Visit: Payer: Medicare Other | Attending: Family | Admitting: Family

## 2021-07-09 VITALS — BP 163/95 | HR 109 | Ht 69.0 in | Wt 235.0 lb

## 2021-07-09 DIAGNOSIS — L853 Xerosis cutis: Secondary | ICD-10-CM | POA: Diagnosis not present

## 2021-07-09 DIAGNOSIS — Z789 Other specified health status: Secondary | ICD-10-CM | POA: Insufficient documentation

## 2021-07-09 DIAGNOSIS — I159 Secondary hypertension, unspecified: Secondary | ICD-10-CM | POA: Insufficient documentation

## 2021-07-09 MED ORDER — EUCERIN EX CREA: TOPICAL_CREAM | CUTANEOUS | 0 refills | Status: DC | PRN

## 2021-07-09 NOTE — Telephone Encounter (Signed)
He is high risk pt, prefer he come in or better yet offer him a video visit he does those ?

## 2021-07-09 NOTE — Progress Notes (Signed)
? ?Joshua [name], is a 66 y.o. male ? ?QMG:[id number] ? ?TOI:[id number] ? ?DOB - December 26, 1955 ? ?Subjective:  ?Chief Complaint and HPI: ?Joshua [name] is a 66 y.o. male here today requesting for antibiotics.  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  Patient denies fever, chills, pain, open wound or any other symptom done dry skin.  No chest pain today or headache. ? ?ED/Hospital notes reviewed.   ?Social History: Reviewed ?Family history: Reviewed ? ?ROS:   ?Constitutional:  No f/c, No night sweats, No unexplained weight loss. ?EENT:  No vision changes, No blurry vision, No hearing changes. No mouth, throat, or ear problems.  ?Respiratory: No cough, No SOB ?Cardiac: No CP, no palpitations ?GI:  No abd pain, No N/V/D. ?GU: No Urinary s/sx ?Musculoskeletal: No joint pain ?Neuro: No headache, no dizziness, no motor weakness.  ?Skin: Dryness ?Endocrine:  No polydipsia. No polyuria.  ?Psych: Denies SI/HI ? ?No problems updated. ? ?ALLERGIES: ?Allergies  ?Allergen Reactions  ? Haldol [Haloperidol]   ? Metformin And Related Diarrhea and Nausea And Vomiting  ? Trazodone And Nefazodone Itching  ? ? ?PAST MEDICAL HISTORY: ?Past Medical History:  ?Diagnosis Date  ? Acute recurrent maxillary sinusitis 01/19/2021  ? Back pain   ? Bipolar disorder ([place])   ? Cannabis use disorder, moderate, dependence ([place])   ? 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 Chronic pain   ? Diabetes mellitus without complication ([place])   ? Generalized anxiety disorder   ? Headache   ? Hypertension   ? Schizoaffective disorder ([place])   ? ? ?MEDICATIONS AT HOME: ?Prior to Admission medications   ?Medication Sig Start Date End Date Taking? Authorizing Provider  ?Accu-Chek FastClix Lancets MISC Use to check blood sugars three times per day 06/17/21  Yes Elsie Stain, MD  ?blood glucose meter kit and supplies KIT Dispense based on patient and  insurance preference. Use up to four times daily as directed. ICD-10 E11.65  Z79.4 07/20/19  Yes Fulp, Cammie, MD  ?Blood Glucose Monitoring Suppl (ACCU-CHEK GUIDE ME) w/Device KIT Use to check blood sugars up to 3 times per day. ICD-10 E11.65, Z79.4 03/03/20  Yes Fulp, Cammie, MD  ?cetirizine (ZYRTEC) 10 MG tablet Take 1 tablet (10 mg total) by mouth daily. 01/19/21  Yes Elsie Stain, MD  ?diclofenac (VOLTAREN) 50 MG EC tablet TAKE 1 TABLET BY MOUTH TWICE DAILY AS NEEDED FOR MODERATE PAIN. EAT BEFORE TAKING THIS MEDICATION 06/19/21  Yes Elsie Stain, MD  ?Dulaglutide (TRULICITY) 1.5 IP/3.8SN SOPN Inject 1.5 mg into the skin once a week. 06/17/21  Yes Elsie Stain, MD  ?glucose blood (ACCU-CHEK GUIDE) test strip Use as instructed to check blood sugars three times per day. ICD-10 E11.65 and Z79.4 03/09/21  Yes Elsie Stain, MD  ?Insulin Glargine [facility] KWIKPEN) 100 UNIT/ML Inject 36 Units into the skin daily. 06/17/21  Yes Elsie Stain, MD  ?Insulin Pen Needle (TRUEPLUS PEN NEEDLES) 32G X 4 MM MISC Use to inject insulin once a day. 06/17/21  Yes Elsie Stain, MD  ?[facility] SUSTENNA 156 MG/ML SUSY injection Inject into the muscle. 04/16/21  Yes [provider]  ?ipratropium-albuterol (DUONEB) 0.5-2.5 (3) MG/3ML SOLN Take 3 mLs by nebulization every 6 (six) hours. 02/23/21  Yes Elsie Stain, MD  ?JARDIANCE 25 MG TABS tablet Take 1 tablet (25 mg total) by mouth daily. 04/28/21  Yes Asencion Noble  E, MD  ?Lancets Misc. (ACCU-CHEK FASTCLIX LANCET) KIT Use to check blood sugars 3 times per day 03/03/20  Yes Fulp, Cammie, MD  ?nicotine (NICODERM CQ - DOSED IN MG/24 HOURS) 21 mg/24hr patch Place 21 mg onto the skin daily.   Yes [provider]  ?Respiratory Therapy Supplies (FLUTTER) DEVI Use with 4 times daily 03/27/19  Yes Elsie Stain, MD  ?Spacer/Aero-Holding Chambers (AEROCHAMBER MV) inhaler Use as instructed 03/27/19  Yes Elsie Stain, MD  ?SUMAtriptan (IMITREX) 50  MG tablet Take 1 tablet (50 mg total) by mouth every 2 (two) hours as needed for migraine. May repeat in 2 hours if headache persists or recurs. 04/28/21  Yes Elsie Stain, MD  ?traMADol (ULTRAM) 50 MG tablet Take 1 tablet (50 mg total) by mouth every 6 (six) hours as needed for moderate pain or severe pain. 07/01/21  Yes Elsie Stain, MD  ?valsartan (DIOVAN) 80 MG tablet Take 1 tablet (80 mg total) by mouth daily. 04/28/21  Yes Elsie Stain, MD  ?VENTOLIN HFA 108 (90 Base) MCG/ACT inhaler Inhale 2 puffs into the lungs every 4 (four) hours as needed for wheezing or shortness of breath. 11/20/19  Yes Elsie Stain, MD  ?gabapentin (NEURONTIN) 300 MG capsule Take 2 capsules (600 mg total) by mouth 2 (two) times daily. 02/23/21 04/24/21  Elsie Stain, MD  ? ? ? ?Objective:  ?EXAM:  ? ?Vitals:  ? 07/09/21 1539  ?BP: (!) 163/95  ?Pulse: (!) 109  ?SpO2: 95%  ?Weight: 235 lb (106.6 kg)  ?Height: 5' 9" (1.753 m)  ? ? ?General appearance : A&OX3. NAD. Non-toxic-appearing ?HEENT: Atraumatic and Normocephalic.  PERRLA. EOM intact.  TM clear B. ?Mouth-MMM, post pharynx WNL w/o erythema, No PND. ?Neck: supple, no JVD. No cervical lymphadenopathy. No thyromegaly ?Chest/Lungs:  Breathing-non-labored, Good air entry bilaterally, breath sounds normal without rales, rhonchi, or wheezing  ?CVS: S1 S2 regular, no murmurs, gallops, rubs  ?Abdomen: Bowel sounds present, Non tender and not distended with no gaurding, rigidity or rebound. ?Extremities: Bilateral Lower Ext shows no edema, both legs are warm to touch with = pulse throughout ?Neurology:  CN II-XII grossly intact, Non focal.   ?Psych:  TP linear. J/I WNL. Normal speech. Appropriate eye contact and affect.  ?Skin: Skin is dry throughout, but intact. ? ?Data Review ?Lab Results  ?Component Value Date  ? HGBA1C 10.0 (A) 06/17/2021  ? HGBA1C 9.1 (A) 02/23/2021  ? HGBA1C 10.4 (A) 06/02/2020  ? ? ? ?Assessment & Plan  ? ?1. Dry skin ?- Eucerin cream BID ? ?2.  History of dog bite ?- Labs today ? ?3. Secondary hypertension ?- CBC with Differential ?- C-reactive protein ?- CMP14+EGFR ? ? ? ? ?Patient have been counseled extensively about nutrition and exercise ? ?Return if symptoms worsen or fail to improve. ? ?The patient was given clear instructions to go to ER or return to medical center if symptoms don't improve, worsen or new problems develop. The patient verbalized understanding. The patient was told to call to get lab results if they haven't heard anything in the next week.  ? ? ? ?Feliberto Gottron, APRN, FNP-C ?[place] ?[place], [place] ?[phone]   ?07/09/2021, 4:20 PM  ?

## 2021-07-09 NOTE — Telephone Encounter (Signed)
Scheduled apt today with Provider Ames Dura, NP ?Patient appreciative for the appt.  ?

## 2021-07-09 NOTE — Telephone Encounter (Signed)
Patient states he has not been feeling well.  ?Took ATB from old prescription (amoxicillin) ?He feels better.  ? ?Asked patient if he could come in today for an apt.  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  ?Gives lots of ailments and symptoms but does not commit to an apt.  ? ?Pls advise. Have limited time availability for appts. on schedule today with Joshua [name].  ? ? ? ?

## 2021-07-09 NOTE — Patient Instructions (Addendum)
1) Labs today ?2) Eucerin Cream to apply twice per day ?3) Call authorities if there is a dog bite next time ?

## 2021-07-10 LAB — CMP14+EGFR
ALT: 48 IU/L — ABNORMAL HIGH (ref 0–44)
AST: 30 IU/L (ref 0–40)
Albumin/Globulin Ratio: 2 (ref 1.2–2.2)
Albumin: 5.1 g/dL — ABNORMAL HIGH (ref 3.8–4.8)
Alkaline Phosphatase: 135 IU/L — ABNORMAL HIGH (ref 44–121)
BUN/Creatinine Ratio: 14 (ref 10–24)
BUN: 22 mg/dL (ref 8–27)
Bilirubin Total: 0.4 mg/dL (ref 0.0–1.2)
CO2: 21 mmol/L (ref 20–29)
Calcium: 10.6 mg/dL — ABNORMAL HIGH (ref 8.6–10.2)
Chloride: 98 mmol/L (ref 96–106)
Creatinine, Ser: 1.59 mg/dL — ABNORMAL HIGH (ref 0.76–1.27)
Globulin, Total: 2.6 g/dL (ref 1.5–4.5)
Glucose: 195 mg/dL — ABNORMAL HIGH (ref 70–99)
Potassium: 4.9 mmol/L (ref 3.5–5.2)
Sodium: 139 mmol/L (ref 134–144)
Total Protein: 7.7 g/dL (ref 6.0–8.5)
eGFR: 48 mL/min/{1.73_m2} — ABNORMAL LOW (ref >59)

## 2021-07-10 LAB — CBC WITH DIFFERENTIAL/PLATELET
Basophils Absolute: 0.1 10*3/uL (ref 0.0–0.2)
Basos: 1 %
EOS (ABSOLUTE): 0.1 10*3/uL (ref 0.0–0.4)
Eos: 0 %
Hematocrit: 50.8 % (ref 37.5–51.0)
Hemoglobin: 17.2 g/dL (ref 13.0–17.7)
Immature Grans (Abs): 0.1 10*3/uL (ref 0.0–0.1)
Immature Granulocytes: 1 %
Lymphocytes Absolute: 2.7 10*3/uL (ref 0.7–3.1)
Lymphs: 21 %
MCH: 30.4 pg (ref 26.6–33.0)
MCHC: 33.9 g/dL (ref 31.5–35.7)
MCV: 90 fL (ref 79–97)
Monocytes Absolute: 1.1 10*3/uL — ABNORMAL HIGH (ref 0.1–0.9)
Monocytes: 9 %
Neutrophils Absolute: 8.5 10*3/uL — ABNORMAL HIGH (ref 1.4–7.0)
Neutrophils: 68 %
Platelets: 237 10*3/uL (ref 150–450)
RBC: 5.65 x10E6/uL (ref 4.14–5.80)
RDW: 13.6 % (ref 11.6–15.4)
WBC: 12.6 10*3/uL — ABNORMAL HIGH (ref 3.4–10.8)

## 2021-07-10 LAB — C-REACTIVE PROTEIN: CRP: 5 mg/L (ref 0–10)

## 2021-07-13 ENCOUNTER — Other Ambulatory Visit: Payer: Self-pay

## 2021-07-13 ENCOUNTER — Emergency Department (HOSPITAL_BASED_OUTPATIENT_CLINIC_OR_DEPARTMENT_OTHER): Payer: Medicare Other

## 2021-07-13 ENCOUNTER — Emergency Department (HOSPITAL_BASED_OUTPATIENT_CLINIC_OR_DEPARTMENT_OTHER)
Admission: EM | Admit: 2021-07-13 | Discharge: 2021-07-13 | Disposition: A | Discharge status: Home / Self Care | Payer: Medicare Other | Attending: Emergency Medicine | Admitting: Emergency Medicine

## 2021-07-13 ENCOUNTER — Encounter (HOSPITAL_BASED_OUTPATIENT_CLINIC_OR_DEPARTMENT_OTHER): Payer: Self-pay | Admitting: Emergency Medicine

## 2021-07-13 ENCOUNTER — Ambulatory Visit: Payer: Self-pay

## 2021-07-13 DIAGNOSIS — R81 Glycosuria: Secondary | ICD-10-CM | POA: Insufficient documentation

## 2021-07-13 DIAGNOSIS — Z794 Long term (current) use of insulin: Secondary | ICD-10-CM | POA: Insufficient documentation

## 2021-07-13 DIAGNOSIS — N433 Hydrocele, unspecified: Secondary | ICD-10-CM | POA: Diagnosis not present

## 2021-07-13 DIAGNOSIS — N451 Epididymitis: Secondary | ICD-10-CM | POA: Diagnosis not present

## 2021-07-13 DIAGNOSIS — E1165 Type 2 diabetes mellitus with hyperglycemia: Secondary | ICD-10-CM | POA: Diagnosis not present

## 2021-07-13 DIAGNOSIS — J449 Chronic obstructive pulmonary disease, unspecified: Secondary | ICD-10-CM | POA: Insufficient documentation

## 2021-07-13 DIAGNOSIS — E871 Hypo-osmolality and hyponatremia: Secondary | ICD-10-CM | POA: Insufficient documentation

## 2021-07-13 DIAGNOSIS — N476 Balanoposthitis: Secondary | ICD-10-CM | POA: Diagnosis not present

## 2021-07-13 DIAGNOSIS — N50812 Left testicular pain: Secondary | ICD-10-CM | POA: Diagnosis present

## 2021-07-13 LAB — CBC
HCT: 45 % (ref 39.0–52.0)
Hemoglobin: 15.1 g/dL (ref 13.0–17.0)
MCH: 30.3 pg (ref 26.0–34.0)
MCHC: 33.6 g/dL (ref 30.0–36.0)
MCV: 90.4 fL (ref 80.0–100.0)
Platelets: 195 10*3/uL (ref 150–400)
RBC: 4.98 MIL/uL (ref 4.22–5.81)
RDW: 14 % (ref 11.5–15.5)
WBC: 10.8 10*3/uL — ABNORMAL HIGH (ref 4.0–10.5)
nRBC: 0 % (ref 0.0–0.2)

## 2021-07-13 LAB — URINALYSIS, ROUTINE W REFLEX MICROSCOPIC
Bilirubin Urine: NEGATIVE
Glucose, UA: >1000 mg/dL — AB
Hgb urine dipstick: NEGATIVE
Ketones, ur: NEGATIVE mg/dL
Nitrite: NEGATIVE
Protein, ur: NEGATIVE mg/dL
Specific Gravity, Urine: 1.011 (ref 1.005–1.030)
pH: 6 (ref 5.0–8.0)

## 2021-07-13 LAB — BASIC METABOLIC PANEL
Anion gap: 12 (ref 5–15)
BUN: 21 mg/dL (ref 8–23)
CO2: 19 mmol/L — ABNORMAL LOW (ref 22–32)
Calcium: 9.1 mg/dL (ref 8.9–10.3)
Chloride: 99 mmol/L (ref 98–111)
Creatinine, Ser: 1.17 mg/dL (ref 0.61–1.24)
GFR, Estimated: >60 mL/min (ref >60)
Glucose, Bld: 158 mg/dL — ABNORMAL HIGH (ref 70–99)
Potassium: 3.6 mmol/L (ref 3.5–5.1)
Sodium: 130 mmol/L — ABNORMAL LOW (ref 135–145)

## 2021-07-13 MED ORDER — CLOTRIMAZOLE 1 % EX CREA: TOPICAL_CREAM | CUTANEOUS | 0 refills | Status: DC

## 2021-07-13 MED ORDER — FLUCONAZOLE 150 MG PO TABS
150.0000 mg | ORAL_TABLET | Freq: Once | ORAL | Status: AC
  Administered 2021-07-13: 150 mg via ORAL
  Filled 2021-07-13: qty 1

## 2021-07-13 MED ORDER — LEVOFLOXACIN 500 MG PO TABS: 500.0000 mg | ORAL_TABLET | Freq: Every day | ORAL | 0 refills | Status: DC

## 2021-07-13 MED ORDER — MUPIROCIN 2 % EX OINT: 1.0000 "application " | TOPICAL_OINTMENT | Freq: Every day | CUTANEOUS | 0 refills | Status: DC

## 2021-07-13 NOTE — ED Notes (Signed)
Patient verbalizes understanding of discharge instructions. Opportunity for questioning and answers were provided. Armband removed by staff, pt discharged from ED to home via POV  

## 2021-07-13 NOTE — Discharge Instructions (Addendum)
Your ultrasound did show some swelling of your testicles you will need to see urology in follow up. Please call tomorrow to make an appointment.  Please take antibiotics for the entire course as prescribed.  ? ? ? 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  It is very important for you to control your blood sugars as uncontrolled blood sugar can cause this to worsen.  Please continue to use your insulin and your other blood sugar medications.  Drink plenty of water.  I have written you prescription for a medication that I would like for you to use twice daily morning and evening until your symptoms resolve.  I am also prescribing you an antibiotic ointment that you will use once daily to help keep the area moist and healing.  To prevent bacterial infection of the tip of your penis ?

## 2021-07-13 NOTE — ED Triage Notes (Signed)
Pt arrives to ED with c/o testicle, penis, and bilateral flank pain. Pt reports testicle/penis pain for over 8 months that worsened last night. He denies penile discharge. He reports he has had bilateral flank pain for "many years." Pt denies urinary frequency/urgency, hematuria.  ?

## 2021-07-13 NOTE — Telephone Encounter (Signed)
Patient states he is headed to the ED on [address].  ?

## 2021-07-13 NOTE — Telephone Encounter (Signed)
Patient called, left VM to return the call to the office to discuss symptoms with a nurse. ? ? ? ?Summary: Requesting medication for poddible infection on with pain in the scrotum  ? Patient and his wife called in and stated that the patient woke up and complaining of pain in his scrotum that started last night. Per Mrs Arnett say that labs show that patient have some type of infection and thinks this infection is in the penis since he can not pull the foreskin back its very painful and seem to be swollen and patient had this issue when he went in and saw Dr on 07/09/21 but not sure if he mentioned this to the provider. Asking for medication and a call back at Ph# [phone]   ?  ? ?

## 2021-07-13 NOTE — Telephone Encounter (Signed)
Patient called, left VM to return the call to the office to discuss symptoms with a nurse. ? ?Summary: Requesting medication for poddible infection on with pain in the scrotum  ? Patient and his wife called in and stated that the patient woke up and complaining of pain in his scrotum that started last night. Per Mrs Botting say that labs show that patient have some type of infection and thinks this infection is in the penis since he can not pull the foreskin back its very painful and seem to be swollen and patient had this issue when he went in and saw Dr on 07/09/21 but not sure if he mentioned this to the provider. Asking for medication and a call back at Ph# [phone]   ?  ? ?

## 2021-07-13 NOTE — Telephone Encounter (Signed)
?  Chief Complaint: foreskin swelling unable to pull back , requesting medication and appt.  ?Symptoms: uncircumcised , foreskin swelling unable to pull back. Able to urinate. Pain . Scrotum swelling and "feels like up inside me". "Feels like little round things in scrotum'.  ?Frequency: last night  ?Pertinent Negatives: Patient denies fever, difficulty urinating ?Disposition: '[x]'$ ED /'[]'$ Urgent Care (no appt availability in office) / '[]'$ Appointment(In office/virtual)/ '[]'$  [place] [facility]/ '[]'$ Home Care/ '[]'$ Refused Recommended Disposition /'[]'$ [place] [facility]/ '[]'$  Follow-up with PCP ?Additional Notes:  ? ?Not sure if patient will go to ED. Please advise . Has been seen for similar issues before. Reports medication applied to penis not working.  ? ? Reason for Disposition ? [1] Not circumcised AND [2] foreskin pulled back and stuck ? ?Answer Assessment - Initial Assessment Questions ?1. SCROTAL SWELLING: "What does the scrotum look like?" "How swollen is it?" (mild, moderate severe; compare to other side) ?    End of penis swelling  ?2. LOCATION: "Where is the swelling located?" ?    End of penis foreskin swelling  ?3. ONSET: "When did the swelling start?" ?    Last night  ?4. PATTERN: "Does it come and go, or has it been constant since it started?" ?    Constant  ?5. SCROTAL PAIN: "Is there any pain?" If Yes, ask: "How bad is it?"  (Scale 1-10; or mild, moderate, severe) ?   Feels like "little round things in testicles" ?6. HERNIA: "Has a doctor ever told you that you have a hernia?" ?    na ?7. OTHER SYMPTOMS: "Do you have any other symptoms?" (e.g., fever, abdominal pain, vomiting, difficulty passing urine) ?    Scrotum pain ? ?Answer Assessment - Initial Assessment Questions ?1. SYMPTOM: "What's the main symptom you're concerned about?" (e.g., discharge from penis, rash, pain, itching, swelling) ?    Foreskin swelling and unable to pull back  ?2. LOCATION: "Where is the swelling  located?" ?    End of penis   ?3. ONSET: "When did swelling   start?" ?    Last night  ?4. PAIN: "Is there any pain?" If Yes, ask: "How bad is it?"  (Scale 1-10; or mild, moderate, severe) ?    Yes in penis but no pain in scrotum  ?5. URINE: "Any difficulty passing urine?" If Yes, ask: "When was the last time?" ?    No  ?6. CAUSE: "What do you think is causing the symptoms?" ?    Not sure  ?7. OTHER SYMPTOMS: "Do you have any other symptoms?" (e.g., fever, abdominal pain, blood in urine) ?    Pain in scrotum, feels like up inside me and "little round things" in scrotum ? ?Protocols used: Scrotum Swelling-A-AH, Penis and Scrotum Symptoms-A-AH ? ?

## 2021-07-13 NOTE — ED Provider Notes (Signed)
?[facility] EMERGENC[facility] DEPT ?Provider Note ? ? ?CSN: [id number] ?Arrival date & time: 07/13/21  1341 ? ?  ? ?History ? ?Chief Complaint  ?Patient presents with  ? Testicle Pain  ? Flank Pain  ? ? ?Joshua [name] is a 66 y.o. male. ? ? ?Testicle Pain ? ?Flank Pain ? ?Patient is a 66 year old male with past medical history significant for diabetes, generalized anxiety, schizophrenia, COPD, DM2 on insulin chronic pain ? ?Patient presented emergency room today he mentions multiple symptoms of back pain and testicular pain although on further questioning it seems that these have all been present for many months.  He states that he has had some pain to the tip of his penis for the past couple months. ? ?He is a generally poor historian but able to answer questions when phrased multiple ways. ? ?He denies any difficulty urinating.  He states that he stopped taking his insulin for a few days this past week because he felt that it was making his penis more swollen but began taking his insulin again today.  No fevers no nausea vomiting chest pain difficulty breathing. ? ?  ? ?Home Medications ?Prior to Admission medications   ?Medication Sig Start Date End Date Taking? Authorizing Provider  ?clotrimazole (LOTRIMIN) 1 % cream Apply to affected area 2 times daily 07/13/21  Yes Jersey Espinoza, Daguao S, PA  ?levofloxacin (LEVAQUIN) 500 MG tablet Take 1 tablet (500 mg total) by mouth daily. 07/13/21  Yes Leanah Kolander, Ova Freshwater S, PA  ?mupirocin ointment (BACTROBAN) 2 % Apply 1 application. topically daily. To glans of penis (tip of penis) 07/13/21  Yes Tedd Sias, PA  ?Accu-Chek FastClix Lancets MISC Use to check blood sugars three times per day 06/17/21   Elsie Stain, MD  ?blood glucose meter kit and supplies KIT Dispense based on patient and insurance preference. Use up to four times daily as directed. ICD-10 E11.65  Z79.4 07/20/19   Fulp, Ander Gaster, MD  ?Blood Glucose Monitoring Suppl (ACCU-CHEK GUIDE ME) w/Device KIT Use to  check blood sugars up to 3 times per day. ICD-10 E11.65, Z79.4 03/03/20   Fulp, Ander Gaster, MD  ?cetirizine (ZYRTEC) 10 MG tablet Take 1 tablet (10 mg total) by mouth daily. 01/19/21   Elsie Stain, MD  ?diclofenac (VOLTAREN) 50 MG EC tablet TAKE 1 TABLET BY MOUTH TWICE DAILY AS NEEDED FOR MODERATE PAIN. EAT BEFORE TAKING THIS MEDICATION 06/19/21   Elsie Stain, MD  ?Dulaglutide (TRULICITY) 1.5 PQ/3.3AQ SOPN Inject 1.5 mg into the skin once a week. 06/17/21   Elsie Stain, MD  ?gabapentin (NEURONTIN) 300 MG capsule Take 2 capsules (600 mg total) by mouth 2 (two) times daily. 02/23/21 04/24/21  Elsie Stain, MD  ?glucose blood (ACCU-CHEK GUIDE) test strip Use as instructed to check blood sugars three times per day. ICD-10 E11.65 and Z79.4 03/09/21   Elsie Stain, MD  ?Insulin Glargine [facility]) 100 UNIT/ML Inject 36 Units into the skin daily. 06/17/21   Elsie Stain, MD  ?Insulin Pen Needle (TRUEPLUS PEN NEEDLES) 32G X 4 MM MISC Use to inject insulin once a day. 06/17/21   Elsie Stain, MD  ?[facility] SUSTENNA 156 MG/ML SUSY injection Inject into the muscle. 04/16/21   [provider]  ?ipratropium-albuterol (DUONEB) 0.5-2.5 (3) MG/3ML SOLN Take 3 mLs by nebulization every 6 (six) hours. 02/23/21   Elsie Stain, MD  ?JARDIANCE 25 MG TABS tablet Take 1 tablet (25 mg total) by mouth daily. 04/28/21  Elsie Stain, MD  ?Lancets Misc. (ACCU-CHEK FASTCLIX LANCET) KIT Use to check blood sugars 3 times per day 03/03/20   Fulp, Cammie, MD  ?nicotine (NICODERM CQ - DOSED IN MG/24 HOURS) 21 mg/24hr patch Place 21 mg onto the skin daily.    [provider]  ?Respiratory Therapy Supplies (FLUTTER) DEVI Use with 4 times daily 03/27/19   Elsie Stain, MD  ?Skin Protectants, Misc. (EUCERIN) cream Apply topically as needed for dry skin. 07/09/21   Feliberto Gottron, FNP  ?Spacer/Aero-Holding Chambers (AEROCHAMBER MV) inhaler Use as instructed 03/27/19   Elsie Stain, MD   ?SUMAtriptan (IMITREX) 50 MG tablet Take 1 tablet (50 mg total) by mouth every 2 (two) hours as needed for migraine. May repeat in 2 hours if headache persists or recurs. 04/28/21   Elsie Stain, MD  ?traMADol (ULTRAM) 50 MG tablet Take 1 tablet (50 mg total) by mouth every 6 (six) hours as needed for moderate pain or severe pain. 07/01/21   Elsie Stain, MD  ?valsartan (DIOVAN) 80 MG tablet Take 1 tablet (80 mg total) by mouth daily. 04/28/21   Elsie Stain, MD  ?VENTOLIN HFA 108 (90 Base) MCG/ACT inhaler Inhale 2 puffs into the lungs every 4 (four) hours as needed for wheezing or shortness of breath. 11/20/19   Elsie Stain, MD  ?   ? ?Allergies    ?Haldol [haloperidol], Metformin and related, and Trazodone and nefazodone   ? ?Review of Systems   ?Review of Systems  ?Genitourinary:  Positive for flank pain and testicular pain.  ? ?Physical Exam ?Updated Vital Signs ?BP 132/84 (BP Location: Right Arm)   Pulse 92   Temp 98.1 ?F (36.7 ?C) (Oral)   Resp (!) 22   SpO2 99%  ?Physical Exam ? ?ED Results / Procedures / Treatments   ?Labs ?(all labs ordered are listed, but only abnormal results are displayed) ?Labs Reviewed  ?URINALYSIS, ROUTINE W REFLEX MICROSCOPIC - Abnormal; Notable for the following components:  ?    Result Value  ? Glucose, UA >1,000 (*)   ? Leukocytes,Ua SMALL (*)   ? All other components within normal limits  ?BASIC METABOLIC PANEL - Abnormal; Notable for the following components:  ? Sodium 130 (*)   ? CO2 19 (*)   ? Glucose, Bld 158 (*)   ? All other components within normal limits  ?CBC - Abnormal; Notable for the following components:  ? WBC 10.8 (*)   ? All other components within normal limits  ? ? ?EKG ?None ? ?Radiology ?US SCROTUM W/DOPPLER ? ?Result Date: 07/13/2021 ?CLINICAL DATA:  Testicular pain. EXAM: SCROTAL ULTRASOUND DOPPLER ULTRASOUND OF THE TESTICLES TECHNIQUE: Complete ultrasound examination of the testicles, epididymis, and other scrotal structures was performed.  Color and spectral Doppler ultrasound were also utilized to evaluate blood flow to the testicles. COMPARISON:  None. FINDINGS: Right testicle Measurements: 3.8 x 2.6 x 2.2 cm. No focal mass. Multiple calcifications are present. Left testicle Measurements: 3.5 x 2.8 x 2.5 cm. No focal mass. Multiple calcifications are present. Right epididymis:  Normal in size and appearance. Left epididymis: Normal in size and appearance. 3 mm epididymal head cyst present. Hydrocele: There are large bilateral hydroceles, right greater than left. There is some septations in the inferior right hydrocele. Varicocele:  None visualized. Pulsed Doppler interrogation of both testes demonstrates normal low resistance arterial and venous waveforms bilaterally. IMPRESSION: 1. Large bilateral hydroceles, right greater than left. There are septations in the inferior  right hydrocele which can be seen in the setting of infection. 2. No acute abnormality involving the testicles. 3. Bilateral testicular microlithiasis. Follow-up ultrasound recommended in 12 months. Electronically Signed   By: Ronney Asters M.D.   On: 07/13/2021 19:19   ? ?Procedures ?Procedures  ? ? ?Medications Ordered in ED ?Medications  ?fluconazole (DIFLUCAN) tablet 150 mg (150 mg Oral Given 07/13/21 1956)  ? ? ?ED Course/ Medical Decision Making/ A&P ?Clinical Course as of 07/13/21 1959  ?Mon Jul 13, 2021  ?1741 Months ago patient had a hair wrapped around his penis and it has hurt since. One week ago he took lotion and attempted to clean the area.  [WF]  ?  ?Clinical Course User Index ?[WF] Tedd Sias, [place]  ? ?                        ?Medical Decision Making ?Amount and/or Complexity of Data Reviewed ?Labs: ordered. ?Radiology: ordered. ? ?Risk ?Prescription drug management. ? ? ?This patient presents to the ED for concern of penile/testicular pain, this involves a number of treatment options, and is a complaint that carries with it a moderate to high risk of  complications and morbidity.  The differential diagnosis includes torsion, epididymitis, balanitis, other fungal infection, cellulitis, UTI, STD ? ? ?Co morbidities: ?Discussed in HPI ? ? ?Brief History: ? ?Patient

## 2021-07-14 ENCOUNTER — Telehealth: Payer: Self-pay

## 2021-07-14 NOTE — Telephone Encounter (Signed)
Pls fu back up with pt for asap for availability of appt. [phone]. Leave a message of appt if someway they miss call. Pls Fu ?

## 2021-07-14 NOTE — Telephone Encounter (Signed)
Called pt and left vm about getting him an appointment Monday or Tuesday  ?

## 2021-07-15 NOTE — Telephone Encounter (Signed)
Called pt, states that he will think it over on what day (mon or Tues) he will come  ?

## 2021-07-16 ENCOUNTER — Ambulatory Visit: Payer: Medicare Other | Admitting: Pharmacist

## 2021-07-23 ENCOUNTER — Ambulatory Visit: Payer: Self-pay | Admitting: *Deleted

## 2021-07-23 ENCOUNTER — Emergency Department (HOSPITAL_COMMUNITY): Payer: Medicare Other

## 2021-07-23 ENCOUNTER — Emergency Department (HOSPITAL_COMMUNITY)
Admission: EM | Admit: 2021-07-23 | Discharge: 2021-07-23 | Disposition: A | Discharge status: Home / Self Care | Payer: Medicare Other | Attending: Emergency Medicine | Admitting: Emergency Medicine

## 2021-07-23 ENCOUNTER — Other Ambulatory Visit: Payer: Self-pay

## 2021-07-23 DIAGNOSIS — E119 Type 2 diabetes mellitus without complications: Secondary | ICD-10-CM | POA: Diagnosis not present

## 2021-07-23 DIAGNOSIS — Z79899 Other long term (current) drug therapy: Secondary | ICD-10-CM | POA: Diagnosis not present

## 2021-07-23 DIAGNOSIS — F419 Anxiety disorder, unspecified: Secondary | ICD-10-CM | POA: Diagnosis not present

## 2021-07-23 DIAGNOSIS — F1210 Cannabis abuse, uncomplicated: Secondary | ICD-10-CM | POA: Insufficient documentation

## 2021-07-23 DIAGNOSIS — F259 Schizoaffective disorder, unspecified: Secondary | ICD-10-CM | POA: Diagnosis not present

## 2021-07-23 DIAGNOSIS — Z794 Long term (current) use of insulin: Secondary | ICD-10-CM | POA: Diagnosis not present

## 2021-07-23 DIAGNOSIS — Z703 Counseling related to combined concerns regarding sexual attitude, behavior and orientation: Secondary | ICD-10-CM | POA: Diagnosis not present

## 2021-07-23 DIAGNOSIS — Z20822 Contact with and (suspected) exposure to covid-19: Secondary | ICD-10-CM | POA: Diagnosis not present

## 2021-07-23 DIAGNOSIS — R4689 Other symptoms and signs involving appearance and behavior: Secondary | ICD-10-CM

## 2021-07-23 DIAGNOSIS — I10 Essential (primary) hypertension: Secondary | ICD-10-CM | POA: Diagnosis not present

## 2021-07-23 LAB — CBC WITH DIFFERENTIAL/PLATELET
Abs Immature Granulocytes: 0.02 10*3/uL (ref 0.00–0.07)
Basophils Absolute: 0 10*3/uL (ref 0.0–0.1)
Basophils Relative: 1 %
Eosinophils Absolute: 0 10*3/uL (ref 0.0–0.5)
Eosinophils Relative: 1 %
HCT: 43 % (ref 39.0–52.0)
Hemoglobin: 14.3 g/dL (ref 13.0–17.0)
Immature Granulocytes: 1 %
Lymphocytes Relative: 34 %
Lymphs Abs: 1.5 10*3/uL (ref 0.7–4.0)
MCH: 31 pg (ref 26.0–34.0)
MCHC: 33.3 g/dL (ref 30.0–36.0)
MCV: 93.3 fL (ref 80.0–100.0)
Monocytes Absolute: 0.5 10*3/uL (ref 0.1–1.0)
Monocytes Relative: 12 %
Neutro Abs: 2.3 10*3/uL (ref 1.7–7.7)
Neutrophils Relative %: 51 %
Platelets: 160 10*3/uL (ref 150–400)
RBC: 4.61 MIL/uL (ref 4.22–5.81)
RDW: 13.8 % (ref 11.5–15.5)
WBC: 4.4 10*3/uL (ref 4.0–10.5)
nRBC: 0 % (ref 0.0–0.2)

## 2021-07-23 LAB — COMPREHENSIVE METABOLIC PANEL
ALT: 31 U/L (ref 0–44)
AST: 27 U/L (ref 15–41)
Albumin: 3.3 g/dL — ABNORMAL LOW (ref 3.5–5.0)
Alkaline Phosphatase: 73 U/L (ref 38–126)
Anion gap: 9 (ref 5–15)
BUN: 18 mg/dL (ref 8–23)
CO2: 22 mmol/L (ref 22–32)
Calcium: 8.7 mg/dL — ABNORMAL LOW (ref 8.9–10.3)
Chloride: 105 mmol/L (ref 98–111)
Creatinine, Ser: 1.36 mg/dL — ABNORMAL HIGH (ref 0.61–1.24)
GFR, Estimated: 58 mL/min — ABNORMAL LOW (ref >60)
Glucose, Bld: 108 mg/dL — ABNORMAL HIGH (ref 70–99)
Potassium: 4 mmol/L (ref 3.5–5.1)
Sodium: 136 mmol/L (ref 135–145)
Total Bilirubin: 0.4 mg/dL (ref 0.3–1.2)
Total Protein: 6 g/dL — ABNORMAL LOW (ref 6.5–8.1)

## 2021-07-23 LAB — RAPID URINE DRUG SCREEN, HOSP PERFORMED
Amphetamines: NOT DETECTED
Barbiturates: NOT DETECTED
Benzodiazepines: NOT DETECTED
Cocaine: NOT DETECTED
Opiates: NOT DETECTED
Tetrahydrocannabinol: POSITIVE — AB

## 2021-07-23 LAB — RESP PANEL BY RT-PCR (FLU A&B, COVID) ARPGX2
Influenza A by PCR: NEGATIVE
Influenza B by PCR: NEGATIVE
SARS Coronavirus 2 by RT PCR: NEGATIVE

## 2021-07-23 LAB — ETHANOL: Alcohol, Ethyl (B): <10 mg/dL (ref <10)

## 2021-07-23 MED ORDER — ALBUTEROL SULFATE HFA 108 (90 BASE) MCG/ACT IN AERS
2.0000 | INHALATION_SPRAY | Freq: Once | RESPIRATORY_TRACT | Status: AC
  Administered 2021-07-23: 2 via RESPIRATORY_TRACT
  Filled 2021-07-23: qty 6.7

## 2021-07-23 NOTE — BH Assessment (Signed)
@  1718, requested patient's nurse Lysbeth Galas, RN) to set the TTS machine up for patients initial TTS assessment.  ?

## 2021-07-23 NOTE — ED Triage Notes (Signed)
Pt accompanied by family from home, needing psychiatric evaluation. Pt somewhat defensive in triage, rambling about events of this morning including turning a lever on the washer which spewed water everywhere, a bug running around in his shower while trying to catch it in a mason jar, etc. Regularly takes Invega shots and is requesting one.  ?

## 2021-07-23 NOTE — ED Provider Triage Note (Signed)
Emergency Medicine Provider Triage Evaluation Note ? ?Joshua [name] , a 66 y.o. male  was evaluated in triage. Pt brought in for psychiatric evaluation. States he was at home using a nebulizer breathing treatment when he opened the valve of his water heater to "let the pressure out". ? ?Family member reports he has been refusing Invega injections and other medications and wants him to have psychiatric eval.  ? ?Review of Systems  ?Positive: "need breathing treatment" ?Negative: SI, HI, AVH ? ?Physical Exam  ?BP (!) 150/103 (BP Location: Right Arm)   Pulse (!) 103   Temp 98.5 ?F (36.9 ?C) (Oral)   Resp 20   SpO2 96%  ?Gen:   Awake, no distress   ?Resp:  Normal effort  ?MSK:   Moves extremities without difficulty  ?Other:  Already in maroon scrubs from previous psych hospitilization ? ?Medical Decision Making  ?Medically screening exam initiated at 12:56 PM.  Appropriate orders placed.  Aldwin Micalizzi Dicke was informed that the remainder of the evaluation will be completed by another provider, this initial triage assessment does not replace that evaluation, and the importance of remaining in the ED until their evaluation is complete. ? ?He is here voluntarily. Will obtain medical screening labs ?  ?Kateri Plummer, PA-C ?07/23/21 1258 ? ?

## 2021-07-23 NOTE — ED Notes (Signed)
Patient changed into purple scrubs and all belongings given to wife.  ?

## 2021-07-23 NOTE — Telephone Encounter (Signed)
?  Chief Complaint: wife reports patient is acting more confused requesting to admit to [place] ?Symptoms: wife reports patient doing strange things and feels he may get hurt. Ex letting pressure off of hot water heater and hot steam going everywhere. Requires breathing treatments more often but not reporting difficulty breathing. Refusing medications invega sustenna inj weekly and refusing jardiance. ?Frequency: x 1 week  ?Pertinent Negatives: Patient denies weakness of either side of body no chest pain . Denies hurting self or others  ?Disposition: '[x]'$ ED /'[]'$ Urgent Care (no appt availability in office) / '[]'$ Appointment(In office/virtual)/ '[]'$  [place] [facility]/ '[]'$ Home Care/ '[]'$ Refused Recommended Disposition /'[]'$ [place] [facility]/ '[]'$  Follow-up with PCP ?Additional Notes:  ? ?Patient's wife requesting assistance getting patient admitted to [facility] due to confusion. Recommended ED for evaluation. Gave contact numbers to wife for [facility] and Urgent crisis center.  Reason for Disposition ? [1] Acting confused (e.g., disoriented, slurred speech) AND [2] brief (now gone) ? ?Answer Assessment - Initial Assessment Questions ?1. LEVEL OF CONSCIOUSNESS: "How is he (she, the patient) acting right now?" (e.g., alert-oriented, confused, lethargic, stuporous, comatose) ?    Alert at this time. Per wife, patient having episodes of confusion  ?2. ONSET: "When did the confusion start?"  (minutes, hours, days) ?    1 week ago  ?3. PATTERN "Does this come and go, or has it been constant since it started?"  "Is it present now?" ?    Wife reports happened this am  ?4. ALCOHOL or DRUGS: "Has he been drinking alcohol or taking any drugs?"  ?    denies ?5. NARCOTIC MEDICATIONS: "Has he been receiving any narcotic medications?" (e.g., morphine, Vicodin) ?    na ?6. CAUSE: "What do you think is causing the confusion?"  ?    Not sure hx mental health issues ?7. OTHER SYMPTOMS: "Are there any other  symptoms?" (e.g., difficulty breathing, headache, fever, weakness) ?    Requires more breathing treatments ? ?Protocols used: Confusion - Delirium-A-AH ? ?

## 2021-07-23 NOTE — BH Assessment (Addendum)
Comprehensive Clinical Assessment (CCA) Note ? ?07/23/2021 ?[place] ?[id number] ? ?Disposition: TTS completed. Per [facility] provider, Garrison Columbus, NP, patient is psych cleared. The recommendations is for patient/family to follow up with current provider at [facility] regarding his medication management needs. According to his spouse it's some concern as to whether or not patient should be given a second Invega injection. The [facility] provider would like patient/family to follow up [facility] ASAP to discuss those concerns and follow directive as given. If patient needs his second injection, his son will be able to provide the medication needed.  ? ?[place] [facility] from 07/23/2021 in [place] ED from 07/13/2021 in [facility] ED from 11/04/2020 in [place]  ?C-SSRS RISK CATEGORY No Risk No Risk No Risk  ? ?  ? ? ? ?Chief Complaint:  ?Chief Complaint  ?Patient presents with  ? Psychiatric Evaluation  ? ?Visit Diagnosis: Schizoaffective disorder, Anxiety, and Cannabis use ? ? ?Joshua [name] is a 66 y.o. male with history of schizoaffective disorder, anxiety, and cannabis use. He presented to [facility], voluntarily. He was transported to the Emergency Department by his spouse. Per H&P notes: ?He presents with his wife at the bedside with concern for dangerous behavior in context of schizoaffective disorder with refusal to take his [place] since November.  He is usually on once monthly injections.  He refuses to follow-up at [facility] because he says there is a woman there who is trying to seduced him but he does usually follow-up with them in the outpatient setting. According to the wife the patient has been rambling, today he pulled the emergency release valve on their water Gatorade, spewing boiling water all over the room.  Patient talking about being attacked by multiple python's and having to take out ticks from his  body, rambling.? ? ?Clinician met with patient via teleassessment. On assessment today, patient is disorganized and tangential in speech. He answers some questions appropriately but then continues with her tangent. Patient speaking of carbon monoxide in cars killing people, his hot water heater blowing up, Gabapentin causing the inside of his body to rot, private areas shriveling up, looking at women butts in local stores, foods that are not good for him, eating snacks, and his private area shriveling up. Patient with rambling stories that have hyper religious context. ?Patient denies current suicidal ideations. Denies a hx of suicide attempts and/or gestures. Denies a hx of self-injurious behaviors. Patient denies depressive symptoms. He has no access to firearms. ? ?Patient denies AVH. However, appears to be paranoid. Says that he recently purchased a watch from [facility] and felt afraid that it was really a bomb. Denies symptoms of depressions and anxious. Denies that he has any issues with his appetite. He sleeps 7- 8 hours per night.  ? ?Patient denies homicidal ideations. Denies hx of aggressive and/or assaultive behaviors. He reports THC use. Age of first use is 66 y/o. Average amount of use is a ?Small joint?. He used daily and the last use was 07/21/2021. ? ?He has a provider at [facility] that prescribes him medications. Patient is non-compliant with mediations. Stats that he was taking an Invega injection. He had his injection in November 2022 and then again 1.5 week ago. He does not have therapist. Patient with a hx of inpatient psychiatric admissions. States, ?I've been in the hospital so many times I can't keep up with it?.  ? ?Patient is married 36 years.  Currently lives in a household with spouse and his adult son (62 yrs old). He also has a daughter. Patient receiving disability since the age of 66 yrs old due to his mental illness. Highest level of education is the 8th grade. Patient raised by both  parents. He has a family hx of mental health illnesses. However, unsure of their diagnosis. His support system is his spouse and daughter.  ? ?CCA Screening, Triage and Referral (STR) ? ?Patient Reported Information ?How did you hear about [place]? Family/Friend (son took out IVC paperwork on patient) ? ?What Is the Reason for Your Visit/Call Today? Per EDP Report: 66 yo M with a chief complaints of hallucinations.  Patient tells me that he has been doing actually fine today but has been hallucinating especially when he drinks alcohol or smokes weed.  States that all of a sudden the police showed up and took him here.  He is not really sure why.  He does think that people have been irritating him somewhat today.  He denies SI or HI.  TTS:  Patient was seen by TTS.  He presents as tangential, circumstantial and very disorganized with his thoughts.  His family reports that patient has not been taking his medication for his schizophrenia.  Patient was prescribed Invega, but states that he has not had it in several months because he states, "it does more harm than good."  Patient was being seen at [facility] for his schizophrenia, but he stopped going.  His family scheduled an appointment for him today at [facility], but patient stated that he was not feeling well, so he did not go.  Patient and his son evidently had a verbal altercation and the son went out and petitioned him for commitment.  Evidently, patient must have been resistant to coming to the hospital because his arms were badly bruised by the police when they took him into custody.  Patient denies SI/HI.  However, he describes several different hallucinations, mostly visual, recently seen.  He states that in his front ditch of his home the other night that he saw people, coyotes and wolves.  He also spoke about seeing someone get hit by a semi in front of his house and getting up and walking off.  He also shared of going fishing one  night and being surrounded by a group of people with snot in their noses.  Patient states that he sleeps okay, but states that he has not been eating that well.  Patient lives with his wife and son and he is on disability and has been since the age of 66 due to his mental health issues. ? ?How Long Has This Been Causing You Problems? 1-6 months ? ?What Do You Feel Would Help You the Most Today? Treatment for Depression or other mood problem ? ? ?Have You Recently Had Any Thoughts About Hurting Yourself? No ? ?Are You Planning to Commit Suicide/Harm Yourself At This time? No ? ? ?Have you Recently Had Thoughts About [place]? No ? ?Are You Planning to Harm Someone at This Time? No ? ?Explanation: No data recorded ? ?Have You Used Any Alcohol or Drugs in the Past 24 Hours? Yes ? ?How Long Ago Did You Use Drugs or Alcohol? No data recorded ?What Did You Use and How Much? states that occasionally smokes marijuana and drinks a couple beers ? ? ?Do You Currently Have a Therapist/Psychiatrist? No ? ?Name of Therapist/Psychiatrist: No data recorded ? ?  Have You Been Recently Discharged From Any Office Practice or Programs? No ? ?Explanation of Discharge From Practice/Program: No data recorded ? ?  ?CCA Screening Triage Referral Assessment ?Type of Contact: Tele-Assessment ? ?Telemedicine Service Delivery:   ?Is this Initial or Reassessment? Initial Assessment ? ?Date Telepsych consult ordered in [facility]:  11/04/20 ? ?Time Telepsych consult ordered in [facility]:  2307 ? ?Location of Assessment: [facility] ED ? ?Provider Location: [facility] ? ? ?Collateral Involvement: not contacted since patient is going to be recommended for inpatient treatment ? ? ?Does Patient Have a Stage manager Guardian? No data recorded ?Name and Contact of Legal Guardian: No data recorded ?If Minor and Not Living with Parent(s), Who has Custody? No data recorded ?Is CPS involved or ever been involved? Never ? ?Is APS involved or  ever been involved? Never ? ? ?Patient Determined To Be At Risk for Harm To Self or Others Based on Review of Patient Reported Information or Presenting Complaint? No ? ?Method: No data recorded ?Availability of

## 2021-07-23 NOTE — ED Notes (Signed)
Pt in room in purple scrubs with wife at bedside. Pt AOx4, stating he feels "fine and should not have to be here". When asked why he is her he points over to wife "ask her, she brought me here". He states "I have been laying in bed a lot more than normal and smoking a lot of weed to help me feel better". ?

## 2021-07-23 NOTE — ED Provider Notes (Signed)
?[facility] ?Provider Note ? ? ?CSN: [id number] ?Arrival date & time: 07/23/21  1216 ? ?  ? ?History ? ?Chief Complaint  ?Patient presents with  ? Psychiatric Evaluation  ? ? ?Joshua [name] is a 66 y.o. male who presents with his wife at the bedside with concern for dangerous behavior in context of schizoaffective disorder with refusal to take his [place] since November.  He is usually on once monthly injections.  He refuses to follow-up at [facility] because he says there is a woman there who is trying to seduced him but he does usually follow-up with them in the outpatient setting. ? ?According to the wife the patient has been rambling, today he pulled the emergency release valve on their water Gatorade, spewing boiling water all over the room.  Patient talking about being attacked by multiple python's and having to take out ticks from his body, rambling. ? ?I personally reviewed this patient's medical records.  Has history of schizoaffective disorder, anxiety, cannabis use, type 2 diabetes, hypertension, and chronic back pain.  Level 5 caveat due to patient's underlying psychiatric disorder. ? ?HPI ? ?  ? ?Home Medications ?Prior to Admission medications   ?Medication Sig Start Date End Date Taking? Authorizing Provider  ?Accu-Chek FastClix Lancets MISC Use to check blood sugars three times per day 06/17/21   Elsie Stain, MD  ?blood glucose meter kit and supplies KIT Dispense based on patient and insurance preference. Use up to four times daily as directed. ICD-10 E11.65  Z79.4 07/20/19   Fulp, Ander Gaster, MD  ?Blood Glucose Monitoring Suppl (ACCU-CHEK GUIDE ME) w/Device KIT Use to check blood sugars up to 3 times per day. ICD-10 E11.65, Z79.4 03/03/20   Fulp, Ander Gaster, MD  ?cetirizine (ZYRTEC) 10 MG tablet Take 1 tablet (10 mg total) by mouth daily. 01/19/21   Elsie Stain, MD  ?clotrimazole (LOTRIMIN) 1 % cream Apply to affected area 2 times daily 07/13/21   Pati Gallo  S, PA  ?diclofenac (VOLTAREN) 50 MG EC tablet TAKE 1 TABLET BY MOUTH TWICE DAILY AS NEEDED FOR MODERATE PAIN. EAT BEFORE TAKING THIS MEDICATION 06/19/21   Elsie Stain, MD  ?Dulaglutide (TRULICITY) 1.5 UX/3.2GM SOPN Inject 1.5 mg into the skin once a week. 06/17/21   Elsie Stain, MD  ?gabapentin (NEURONTIN) 300 MG capsule Take 2 capsules (600 mg total) by mouth 2 (two) times daily. 02/23/21 04/24/21  Elsie Stain, MD  ?glucose blood (ACCU-CHEK GUIDE) test strip Use as instructed to check blood sugars three times per day. ICD-10 E11.65 and Z79.4 03/09/21   Elsie Stain, MD  ?Insulin Glargine [facility]) 100 UNIT/ML Inject 36 Units into the skin daily. 06/17/21   Elsie Stain, MD  ?Insulin Pen Needle (TRUEPLUS PEN NEEDLES) 32G X 4 MM MISC Use to inject insulin once a day. 06/17/21   Elsie Stain, MD  ?[facility] SUSTENNA 156 MG/ML SUSY injection Inject into the muscle. 04/16/21   [provider]  ?ipratropium-albuterol (DUONEB) 0.5-2.5 (3) MG/3ML SOLN Take 3 mLs by nebulization every 6 (six) hours. 02/23/21   Elsie Stain, MD  ?JARDIANCE 25 MG TABS tablet Take 1 tablet (25 mg total) by mouth daily. 04/28/21   Elsie Stain, MD  ?Lancets Misc. (ACCU-CHEK FASTCLIX LANCET) KIT Use to check blood sugars 3 times per day 03/03/20   Fulp, Ander Gaster, MD  ?levofloxacin (LEVAQUIN) 500 MG tablet Take 1 tablet (500 mg total) by mouth daily. 07/13/21  Pati Gallo S, PA  ?mupirocin ointment (BACTROBAN) 2 % Apply 1 application. topically daily. To glans of penis (tip of penis) 07/13/21   Tedd Sias, PA  ?nicotine (NICODERM CQ - DOSED IN MG/24 HOURS) 21 mg/24hr patch Place 21 mg onto the skin daily.    [provider]  ?Respiratory Therapy Supplies (FLUTTER) DEVI Use with 4 times daily 03/27/19   Elsie Stain, MD  ?Skin Protectants, Misc. (EUCERIN) cream Apply topically as needed for dry skin. 07/09/21   Feliberto Gottron, FNP  ?Spacer/Aero-Holding Chambers (AEROCHAMBER MV)  inhaler Use as instructed 03/27/19   Elsie Stain, MD  ?SUMAtriptan (IMITREX) 50 MG tablet Take 1 tablet (50 mg total) by mouth every 2 (two) hours as needed for migraine. May repeat in 2 hours if headache persists or recurs. 04/28/21   Elsie Stain, MD  ?traMADol (ULTRAM) 50 MG tablet Take 1 tablet (50 mg total) by mouth every 6 (six) hours as needed for moderate pain or severe pain. 07/01/21   Elsie Stain, MD  ?valsartan (DIOVAN) 80 MG tablet Take 1 tablet (80 mg total) by mouth daily. 04/28/21   Elsie Stain, MD  ?VENTOLIN HFA 108 (90 Base) MCG/ACT inhaler Inhale 2 puffs into the lungs every 4 (four) hours as needed for wheezing or shortness of breath. 11/20/19   Elsie Stain, MD  ?   ? ?Allergies    ?Haldol [haloperidol], Metformin and related, and Trazodone and nefazodone   ? ?Review of Systems   ?Review of Systems  ?Unable to perform ROS: Psychiatric disorder  ?Psychiatric/Behavioral:  Positive for sleep disturbance.   ? ?Physical Exam ?Updated Vital Signs ?BP (!) 143/90   Pulse 95   Temp 98.5 ?F (36.9 ?C) (Oral)   Resp 16   SpO2 97%  ?Physical Exam ?Vitals and nursing note reviewed.  ?Constitutional:   ?   Appearance: He is obese. He is not ill-appearing or toxic-appearing.  ?HENT:  ?   Head: Normocephalic and atraumatic.  ?   Mouth/Throat:  ?   Mouth: Mucous membranes are moist.  ?   Pharynx: No oropharyngeal exudate or posterior oropharyngeal erythema.  ?Eyes:  ?   General:     ?   Right eye: No discharge.     ?   Left eye: No discharge.  ?   Extraocular Movements: Extraocular movements intact.  ?   Conjunctiva/sclera: Conjunctivae normal.  ?   Pupils: Pupils are equal, round, and reactive to light.  ?Cardiovascular:  ?   Rate and Rhythm: Normal rate and regular rhythm.  ?   Pulses: Normal pulses.  ?   Heart sounds: Normal heart sounds.  ?Pulmonary:  ?   Effort: Pulmonary effort is normal. No tachypnea, bradypnea, accessory muscle usage or respiratory distress.  ?   Breath sounds:  Examination of the right-middle field reveals wheezing. Examination of the right-lower field reveals wheezing. Examination of the left-lower field reveals wheezing. Wheezing present. No rales.  ?Abdominal:  ?   General: Bowel sounds are normal. There is no distension.  ?   Palpations: Abdomen is soft.  ?   Tenderness: There is no abdominal tenderness. There is no guarding or rebound.  ?Musculoskeletal:     ?   General: No deformity.  ?   Cervical back: Neck supple.  ?Skin: ?   General: Skin is warm and dry.  ?   Capillary Refill: Capillary refill takes less than 2 seconds.  ?   Comments: Superficial abrasions  that are hemostatic over the anterior lower legs bilaterally, per patient "there were ticks in my skin that I need to take out". ?  ?Neurological:  ?   General: No focal deficit present.  ?   Mental Status: He is alert and oriented to person, place, and time. Mental status is at baseline.  ?Psychiatric:     ?   Mood and Affect: Mood normal.     ?   Speech: Speech is tangential.     ?   Behavior: Behavior is hyperactive.     ?   Thought Content: Thought content is delusional. Thought content does not include homicidal or suicidal ideation.  ?   Comments: Does not appear to be responding to internal stimuli at time of my initial evaluation.   ? ? ?ED Results / Procedures / Treatments   ?Labs ?(all labs ordered are listed, but only abnormal results are displayed) ?Labs Reviewed  ?COMPREHENSIVE METABOLIC PANEL - Abnormal; Notable for the following components:  ?    Result Value  ? Glucose, Bld 108 (*)   ? Creatinine, Ser 1.36 (*)   ? Calcium 8.7 (*)   ? Total Protein 6.0 (*)   ? Albumin 3.3 (*)   ? GFR, Estimated 58 (*)   ? All other components within normal limits  ?RAPID URINE DRUG SCREEN, HOSP PERFORMED - Abnormal; Notable for the following components:  ? Tetrahydrocannabinol POSITIVE (*)   ? All other components within normal limits  ?RESP PANEL BY RT-PCR (FLU A&B, COVID) ARPGX2  ?ETHANOL  ?CBC WITH  DIFFERENTIAL/PLATELET  ?ACETAMINOPHEN LEVEL  ?SALICYLATE LEVEL  ? ? ?EKG ?None ? ?Radiology ?DG Chest Portable 1 View ? ?Result Date: 07/23/2021 ?CLINICAL DATA:  Wheezing, chest pain EXAM: PORTABLE CHEST 1 VIEW COMPARISO

## 2021-07-23 NOTE — ED Notes (Signed)
TTS completed. Per [facility] provider, Joshua Columbus, NP, patient is psych cleared. The recommendations is for patient/family to follow up with current provider at [facility] regarding his medication management needs. According to his spouse it's some concern as to whether or not patient should be given a second Invega injection. The [facility] provider would like patient/family to follow up [facility] ASAP to discuss those concerns and follow directive as given. If patient needs his second injection, his son will be able to provide the medication needed.  ?

## 2021-07-23 NOTE — Discharge Instructions (Addendum)
Please follow-up with outpatient [facility] providers as discussed with the behavioral health team today.  It is important that Joshua [name] take his prescribed medications.  Return to ER if he develops any new severe symptoms. ?

## 2021-07-30 ENCOUNTER — Telehealth: Payer: Self-pay | Admitting: Critical Care Medicine

## 2021-07-30 DIAGNOSIS — M545 Low back pain, unspecified: Secondary | ICD-10-CM

## 2021-07-30 DIAGNOSIS — G894 Chronic pain syndrome: Secondary | ICD-10-CM

## 2021-08-01 ENCOUNTER — Other Ambulatory Visit: Payer: Self-pay | Admitting: Critical Care Medicine

## 2021-08-01 DIAGNOSIS — G894 Chronic pain syndrome: Secondary | ICD-10-CM

## 2021-08-01 DIAGNOSIS — M545 Low back pain, unspecified: Secondary | ICD-10-CM

## 2021-08-02 NOTE — Telephone Encounter (Signed)
Routed to provider covering for Dr. Joya Gaskins. ?

## 2021-08-03 ENCOUNTER — Other Ambulatory Visit: Payer: Self-pay | Admitting: Critical Care Medicine

## 2021-08-03 DIAGNOSIS — G894 Chronic pain syndrome: Secondary | ICD-10-CM

## 2021-08-03 DIAGNOSIS — M545 Low back pain, unspecified: Secondary | ICD-10-CM

## 2021-08-03 MED ORDER — TRAMADOL HCL 50 MG PO TABS: 50.0000 mg | ORAL_TABLET | Freq: Four times a day (QID) | ORAL | 0 refills | Status: DC | PRN

## 2021-08-04 ENCOUNTER — Other Ambulatory Visit: Payer: Self-pay | Admitting: Critical Care Medicine

## 2021-08-04 DIAGNOSIS — M545 Low back pain, unspecified: Secondary | ICD-10-CM

## 2021-08-04 DIAGNOSIS — G894 Chronic pain syndrome: Secondary | ICD-10-CM

## 2021-08-04 NOTE — Telephone Encounter (Signed)
Copied from [place] [id number]. Topic: Quick Communication - Rx Refill/Question ?>> Aug 04, 2021  9:48 AM Tessa Lerner A wrote: ?Medication: traMADol (ULTRAM) 50 MG tablet [[id number] ? ?Has the patient contacted their pharmacy? Yes.   ?(Agent: If no, request that the patient contact the pharmacy for the refill. If patient does not wish to contact the pharmacy document the reason why and proceed with request.) ?(Agent: If yes, when and what did the pharmacy advise?) ? ?Preferred Pharmacy (with phone number or street name): [id number] - [address], [address] ?[place] [address] ?Phone: [phone] Fax: [phone] ?Hours: Open 24 hours ? ?Has the patient been seen for an appointment in the last year OR does the patient have an upcoming appointment? Yes.   ? ?Agent: Please be advised that RX refills may take up to 3 business days. We ask that you follow-up with your pharmacy. ?

## 2021-08-04 NOTE — Telephone Encounter (Signed)
Requested medications are due for refill today.  no ? ?Requested medications are on the active medications list.  yes ? ?Last refill. 08/03/2021 #120 0 refills ? ?Future visit scheduled.   yes ? ?Notes to clinic.  Medication refill or refusal is not delegated. ? ? ? ?Requested Prescriptions  ?Pending Prescriptions Disp Refills  ? traMADol (ULTRAM) 50 MG tablet [Pharmacy Med Name: TRAMADOL '50MG'$  TABLETS] 120 tablet   ?  Sig: TAKE 1 TABLET(50 MG) BY MOUTH EVERY 6 HOURS AS NEEDED FOR MODERATE PAIN OR SEVERE PAIN  ?  ? Not Delegated - Analgesics:  Opioid Agonists Failed - 08/03/2021  8:03 PM  ?  ?  Failed - This refill cannot be delegated  ?  ?  Passed - Urine Drug Screen completed in last 360 days  ?  ?  Passed - Valid encounter within last 3 months  ?  Recent Outpatient Visits   ? ?      ? 3 weeks ago Secondary hypertension  ? [facility] [place], [place], [place]  ? 1 month ago Type 2 diabetes mellitus with hyperglycemia, with long-term current use of insulin ([place])  ? [place], RPH-CPP  ? 2 months ago Type 2 diabetes mellitus with hyperglycemia, with long-term current use of insulin ([place])  ? [place], RPH-CPP  ? 3 months ago COPD with chronic bronchitis ([place])  ? [place] Elsie Stain, MD  ? 4 months ago Type 2 diabetes mellitus with hyperglycemia, with long-term current use of insulin ([place])  ? [place]ill[place], RPH-CPP  ? ?  ?  ?Future Appointments   ? ?        ? In 6 days Elsie Stain, MD [place]  ? ?  ? ?  ?  ?  ?  ?

## 2021-08-05 ENCOUNTER — Other Ambulatory Visit: Payer: Self-pay | Admitting: Physician Assistant

## 2021-08-05 DIAGNOSIS — G894 Chronic pain syndrome: Secondary | ICD-10-CM

## 2021-08-05 DIAGNOSIS — M545 Low back pain, unspecified: Secondary | ICD-10-CM

## 2021-08-05 MED ORDER — TRAMADOL HCL 50 MG PO TABS: 50.0000 mg | ORAL_TABLET | Freq: Four times a day (QID) | ORAL | 0 refills | Status: DC | PRN

## 2021-08-05 NOTE — Telephone Encounter (Signed)
Sending to the provider covering for Dr. Joya Gaskins. He is out of the office this week. ?

## 2021-08-05 NOTE — Progress Notes (Signed)
Refill tramadol, refill in system was set to print instead of electronic.  Check of [place] controlled substance registry appropriate. ? ?Kennieth Rad, PA-C ?Physician Assistant ?[place] ?[email or web address] ? ?

## 2021-08-05 NOTE — Telephone Encounter (Signed)
Pts wife called back in stating the pharmacy still has not received the medication and needs the script re faxed, please advise.   ?

## 2021-08-05 NOTE — Telephone Encounter (Signed)
Patient wife called in to inform Dr Joya Gaskins that [facility] still does not have the Rx for traMADol (ULTRAM) 50 MG tablet  since the pharmacy have not received the Rx sent on 08/03/21. Patient have taken last tab on 08/03/21 and need his refill today please  Call [facility] at Ph# [phone] ?

## 2021-08-05 NOTE — Telephone Encounter (Signed)
Reviewed pt's chart since this medication has been request so many times. Rx was sent for "Print" instead of being electronically send to pharmacy. Will send to provider since this is a non-delegated medication. ? ?Outpatient Medication Detail ? ? Disp Refills Start End   ?traMADol (ULTRAM) 50 MG tablet 120 tablet 0 08/03/2021    ?Sig - Route: Take 1 tablet (50 mg total) by mouth every 6 (six) hours as needed for moderate pain or severe pain. - Oral   ?Class: Print   ?Notes to Pharmacy: For future fill   ? ?

## 2021-08-05 NOTE — Telephone Encounter (Signed)
Requested medication (s) are due for refill today:no ? ?Requested medication (s) are on the active medication list: yes   ? ?Last refill: 08/03/21  #1220  0 refills ? ?Future visit scheduled yes 08/10/21 ? ?Notes to clinic:Not due for refill. Cannot refuse non-delegated meds per protocol. Sent via Interface ? ?Requested Prescriptions  ?Pending Prescriptions Disp Refills  ? traMADol (ULTRAM) 50 MG tablet 120 tablet 0  ?  Sig: Take 1 tablet (50 mg total) by mouth every 6 (six) hours as needed for moderate pain or severe pain.  ?  ? Not Delegated - Analgesics:  Opioid Agonists Failed - 08/04/2021 12:32 PM  ?  ?  Failed - This refill cannot be delegated  ?  ?  Passed - Urine Drug Screen completed in last 360 days  ?  ?  Passed - Valid encounter within last 3 months  ?  Recent Outpatient Visits   ? ?      ? 3 weeks ago Secondary hypertension  ? [facility], [place], [place]  ? 1 month ago Type 2 diabetes mellitus with hyperglycemia, with long-term current use of insulin ([place])  ? [place], RPH-CPP  ? 2 months ago Type 2 diabetes mellitus with hyperglycemia, with long-term current use of insulin ([place])  ? [place], RPH-CPP  ? 3 months ago COPD with chronic bronchitis ([place])  ? [place] Elsie Stain, MD  ? 4 months ago Type 2 diabetes mellitus with hyperglycemia, with long-term current use of insulin ([place])  ? [place], RPH-CPP  ? ?  ?  ?Future Appointments   ? ?        ? In 5 days Elsie Stain, MD [place]  ? ?  ? ?  ?  ?  ? ? ? ? ?

## 2021-08-09 NOTE — Progress Notes (Addendum)
? ?Established Patient Office Visit ? ?Subjective:  ?Patient ID: Joshua [name], male    DOB: 10-23-1955  Age: 66 y.o. MRN: [id number] ? ?CC:  ?Pain in left lower extremity ? ?HPI ?04/28/21 ?Joshua [name] presents for primary care follow-up for diabetes and hypertension.  Patient continues to smoke 5 to 6 cigarettes daily at this time.  He has not been compliant with his diet occasionally his fasting sugars are 265-220.  When I review his glucometer his blood sugar ranges anywhere from 1 80-2 10 in the morning fasting.  He self increased his insulin glargine to 30 units a day.  He ran out of refills and cannot get an early refill with this medication. ? ?The patient wishes to hold off on his pneumonia vaccine at this visit.  Patient does have a Cologuard kit at home he states he will have this process soon. ? ?Patient is needing a new nebulizer cup and mouthpiece for his nebulizer machine.  He does maintain the Trulicity weekly along with the insulin glargine and the Jardiance 25 mg daily note he cannot tolerate metformin due to side effects. ? ?Patient has a chronic pain management contract that he has been compliant with and needs refills on tramadol.  Drug database was checked and showed no frequent multiple prescribers no evidence of drug diversion ? ?Patient's mental health status is stable at this time he is on monthly Invega injections at this time ? ?4/17 ?Patient returns for primary care follow-up with complaints of pain in the right and left lower extremity.   ?Patient with history of significant delusion.  He thinks he has been bit by [employer].  Patient also is concerned about tick infestation. ?Patient is followed by [facility] and receives [place] injections on a monthly basis.  He would like to receive these here but was explained that he needs to stay with his mental health provider for same. ? ?Patient is having increased wheezing and productive cough of green mucus.  He is still actively  smoking. ? ? ?Past Medical History:  ?Diagnosis Date  ? Acute recurrent maxillary sinusitis 01/19/2021  ? Back pain   ? Bipolar disorder ([place])   ? Cannabis use disorder, moderate, dependence ([place])   ? 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 Chronic pain   ? Diabetes mellitus without complication ([place])   ? Generalized anxiety disorder   ? Headache   ? Hypertension   ? Schizoaffective disorder ([place])   ? ? ?Past Surgical History:  ?Procedure Laterality Date  ? APPENDECTOMY  1970  ? ? ?Family History  ?Problem Relation Age of Onset  ? Diabetes Mother   ? Cancer Mother   ?     unsure what type  ? Heart disease Mother   ? Hypertension Mother   ? Dementia Father   ? Diabetes Brother   ? Dementia Maternal Grandfather   ? Cancer Paternal Grandmother 66  ?     breast  ? Mental illness Neg Hx   ? ? ?Social History  ? ?Socioeconomic History  ? Marital status: Married  ?  Spouse name: Not on file  ? Number of children: Not on file  ? Years of education: Not on file  ? Highest education level: Not on file  ?Occupational History  ? Not on file  ?Tobacco Use  ? Smoking status: Every Day  ?  Packs/day: 2.00  ?  Types: Cigarettes  ? Smokeless tobacco: Never  ?Vaping Use  ?  Vaping Use: Never used  ?Substance and Sexual Activity  ? Alcohol use: Not Currently  ? Drug use: Yes  ?  Types: Marijuana  ?  Comment: "have changed to hemp now" 09/17/17  ? Sexual activity: Not on file  ?Other Topics Concern  ? Not on file  ?Social History Narrative  ? Not on file  ? ?Social Determinants of Health  ? ?Financial Resource Strain: Not on file  ?Food Insecurity: Not on file  ?Transportation Needs: Not on file  ?Physical Activity: Not on file  ?Stress: Not on file  ?Social Connections: Not on file  ?Intimate Partner Violence: Not on file  ? ? ?Outpatient Medications Prior to Visit  ?Medication Sig Dispense Refill  ? Dulaglutide (TRULICITY) 1.5 ZO/1.0RU SOPN Inject 1.5 mg into the skin once a week. 2 mL 2  ? Accu-Chek  FastClix Lancets MISC Use to check blood sugars three times per day 102 each 11  ? blood glucose meter kit and supplies KIT Dispense based on patient and insurance preference. Use up to four times daily as directed. ICD-10 E11.65  Z79.4 1 each 0  ? Blood Glucose Monitoring Suppl (ACCU-CHEK GUIDE ME) w/Device KIT Use to check blood sugars up to 3 times per day. ICD-10 E11.65, Z79.4 1 kit 0  ? cetirizine (ZYRTEC) 10 MG tablet Take 1 tablet (10 mg total) by mouth daily. 30 tablet 11  ? clotrimazole (LOTRIMIN) 1 % cream Apply to affected area 2 times daily 15 g 0  ? diclofenac (VOLTAREN) 50 MG EC tablet TAKE 1 TABLET BY MOUTH TWICE DAILY AS NEEDED FOR MODERATE PAIN. EAT BEFORE TAKING THIS MEDICATION 60 tablet 2  ? glucose blood (ACCU-CHEK GUIDE) test strip Use as instructed to check blood sugars three times per day. ICD-10 E11.65 and Z79.4 100 each 11  ? Insulin Pen Needle (TRUEPLUS PEN NEEDLES) 32G X 4 MM MISC Use to inject insulin once a day. 100 each 2  ? INVEGA SUSTENNA 156 MG/ML SUSY injection Inject into the muscle.    ? ipratropium-albuterol (DUONEB) 0.5-2.5 (3) MG/3ML SOLN Take 3 mLs by nebulization every 6 (six) hours. 360 mL 0  ? Lancets Misc. (ACCU-CHEK FASTCLIX LANCET) KIT Use to check blood sugars 3 times per day 1 kit 11  ? nicotine (NICODERM CQ - DOSED IN MG/24 HOURS) 21 mg/24hr patch Place 21 mg onto the skin daily.    ? Respiratory Therapy Supplies (FLUTTER) DEVI Use with 4 times daily 1 each 0  ? Skin Protectants, Misc. (EUCERIN) cream Apply topically as needed for dry skin. 454 g 0  ? Spacer/Aero-Holding Chambers (AEROCHAMBER MV) inhaler Use as instructed 1 each 0  ? SUMAtriptan (IMITREX) 50 MG tablet Take 1 tablet (50 mg total) by mouth every 2 (two) hours as needed for migraine. May repeat in 2 hours if headache persists or recurs. 20 tablet 1  ? traMADol (ULTRAM) 50 MG tablet Take 1 tablet (50 mg total) by mouth every 6 (six) hours as needed for moderate pain or severe pain. 120 tablet 0  ?  traZODone (DESYREL) 100 MG tablet Take 200 mg by mouth at bedtime as needed.    ? VENTOLIN HFA 108 (90 Base) MCG/ACT inhaler Inhale 2 puffs into the lungs every 4 (four) hours as needed for wheezing or shortness of breath. 18 g 3  ? gabapentin (NEURONTIN) 300 MG capsule Take 2 capsules (600 mg total) by mouth 2 (two) times daily. 240 capsule 3  ? hydrOXYzine (ATARAX) 50 MG tablet Take 50  mg by mouth 3 (three) times daily.    ? Insulin Glargine (BASAGLAR KWIKPEN) 100 UNIT/ML Inject 36 Units into the skin daily. 12 mL 2  ? JARDIANCE 25 MG TABS tablet Take 1 tablet (25 mg total) by mouth daily. 30 tablet 2  ? levofloxacin (LEVAQUIN) 500 MG tablet Take 1 tablet (500 mg total) by mouth daily. 7 tablet 0  ? mupirocin ointment (BACTROBAN) 2 % Apply 1 application. topically daily. To glans of penis (tip of penis) 22 g 0  ? valsartan (DIOVAN) 80 MG tablet Take 1 tablet (80 mg total) by mouth daily. 90 tablet 2  ? ?No facility-administered medications prior to visit.  ? ? ?Allergies  ?Allergen Reactions  ? Haldol [Haloperidol]   ? Metformin And Related Diarrhea and Nausea And Vomiting  ? Trazodone And Nefazodone Itching  ? ? ?ROS ?Review of Systems  ?Constitutional: Negative.   ?HENT: Negative.  Negative for ear pain, postnasal drip, rhinorrhea, sinus pressure, sore throat, trouble swallowing and voice change.   ?Eyes: Negative.   ?Respiratory:  Positive for wheezing. Negative for apnea, cough, choking, chest tightness, shortness of breath and stridor.   ?Cardiovascular: Negative.  Negative for chest pain, palpitations and leg swelling.  ?Gastrointestinal: Negative.  Negative for abdominal distention, abdominal pain, nausea and vomiting.  ?Genitourinary: Negative.   ?Musculoskeletal:  Positive for back pain. Negative for arthralgias and myalgias.  ?Skin: Negative.  Negative for rash.  ?Allergic/Immunologic: Negative.  Negative for environmental allergies and food allergies.  ?Neurological: Negative.  Negative for dizziness,  syncope, weakness and headaches.  ?Hematological: Negative.  Negative for adenopathy. Does not bruise/bleed easily.  ?Psychiatric/Behavioral: Negative.  Negative for agitation, dysphoric mood, hallucinations, self-in

## 2021-08-10 ENCOUNTER — Ambulatory Visit: Payer: Medicare Other | Attending: Critical Care Medicine | Admitting: Critical Care Medicine

## 2021-08-10 ENCOUNTER — Encounter: Payer: Self-pay | Admitting: Critical Care Medicine

## 2021-08-10 VITALS — BP 134/85 | HR 100 | Wt 238.6 lb

## 2021-08-10 DIAGNOSIS — F17200 Nicotine dependence, unspecified, uncomplicated: Secondary | ICD-10-CM

## 2021-08-10 DIAGNOSIS — M5442 Lumbago with sciatica, left side: Secondary | ICD-10-CM | POA: Diagnosis not present

## 2021-08-10 DIAGNOSIS — J449 Chronic obstructive pulmonary disease, unspecified: Secondary | ICD-10-CM | POA: Diagnosis not present

## 2021-08-10 DIAGNOSIS — Z79899 Other long term (current) drug therapy: Secondary | ICD-10-CM | POA: Insufficient documentation

## 2021-08-10 DIAGNOSIS — I159 Secondary hypertension, unspecified: Secondary | ICD-10-CM

## 2021-08-10 DIAGNOSIS — Z794 Long term (current) use of insulin: Secondary | ICD-10-CM | POA: Diagnosis not present

## 2021-08-10 DIAGNOSIS — I10 Essential (primary) hypertension: Secondary | ICD-10-CM | POA: Diagnosis not present

## 2021-08-10 DIAGNOSIS — F17210 Nicotine dependence, cigarettes, uncomplicated: Secondary | ICD-10-CM | POA: Insufficient documentation

## 2021-08-10 DIAGNOSIS — R062 Wheezing: Secondary | ICD-10-CM | POA: Diagnosis not present

## 2021-08-10 DIAGNOSIS — E1165 Type 2 diabetes mellitus with hyperglycemia: Secondary | ICD-10-CM

## 2021-08-10 DIAGNOSIS — Z7985 Long-term (current) use of injectable non-insulin antidiabetic drugs: Secondary | ICD-10-CM | POA: Diagnosis not present

## 2021-08-10 DIAGNOSIS — G8929 Other chronic pain: Secondary | ICD-10-CM | POA: Diagnosis not present

## 2021-08-10 DIAGNOSIS — F1220 Cannabis dependence, uncomplicated: Secondary | ICD-10-CM

## 2021-08-10 DIAGNOSIS — M79661 Pain in right lower leg: Secondary | ICD-10-CM | POA: Insufficient documentation

## 2021-08-10 DIAGNOSIS — F250 Schizoaffective disorder, bipolar type: Secondary | ICD-10-CM

## 2021-08-10 MED ORDER — HYDROXYZINE HCL 50 MG PO TABS: 50.0000 mg | ORAL_TABLET | Freq: Three times a day (TID) | ORAL | 2 refills | Status: DC

## 2021-08-10 MED ORDER — GABAPENTIN 300 MG PO CAPS: 600.0000 mg | ORAL_CAPSULE | Freq: Two times a day (BID) | ORAL | 3 refills | Status: DC

## 2021-08-10 MED ORDER — AZITHROMYCIN 250 MG PO TABS: ORAL_TABLET | ORAL | 0 refills | Status: DC

## 2021-08-10 MED ORDER — VALSARTAN 80 MG PO TABS: 80.0000 mg | ORAL_TABLET | Freq: Every day | ORAL | 2 refills | Status: DC

## 2021-08-10 MED ORDER — TRULICITY 1.5 MG/0.5ML ~~LOC~~ SOAJ
1.5000 mg | SUBCUTANEOUS | 2 refills | Status: DC
Start: 2021-08-10 — End: 2021-09-09

## 2021-08-10 MED ORDER — BASAGLAR KWIKPEN 100 UNIT/ML ~~LOC~~ SOPN: 36.0000 [IU] | PEN_INJECTOR | Freq: Every day | SUBCUTANEOUS | 2 refills | Status: DC

## 2021-08-10 MED ORDER — JARDIANCE 25 MG PO TABS: 25.0000 mg | ORAL_TABLET | Freq: Every day | ORAL | 2 refills | Status: DC

## 2021-08-10 NOTE — Assessment & Plan Note (Signed)
? ? ??   Current smoking consumption amount: 5 to 6 cigarettes daily ? ?? Dicsussion on advise to quit smoking and smoking impacts: Cardiovascular lung impacts ? ?? Patient's willingness to quit: Does not appear engaged in quitting ? ?? Methods to quit smoking discussed: Behavioral modification ? ?? Medication management of smoking session drugs discussed: None ? ?? Resources provided:  AVS  ? ?? Setting quit date not established ? ?? Follow-up arranged 4 months ? ? ?Time spent counseling the patient: 3 minutes ? ? ?

## 2021-08-10 NOTE — Assessment & Plan Note (Signed)
Continue current insulin program ?

## 2021-08-10 NOTE — Assessment & Plan Note (Signed)
Blood pressure at goal no changes made 

## 2021-08-10 NOTE — Assessment & Plan Note (Signed)
Mild exacerbation for this we will prescribe azithromycin for 5 days continue inhaled medications ?

## 2021-08-10 NOTE — Assessment & Plan Note (Signed)
Recommended patient continue to follow with [facility] for Invega injections ?

## 2021-08-10 NOTE — Patient Instructions (Signed)
Take azithromycin for 5 days as prescribed this was sent to your [facility] pharmacy for bronchitis ? ?Refills on your medications were sent to your [facility] pharmacy ? ?Please keep your follow-up appointments with [facility] and your monthly Invega injections the next 1 is 1 May ? ?Return to see Dr. Joya Gaskins in 3 months ?

## 2021-08-11 ENCOUNTER — Telehealth: Payer: Self-pay | Admitting: Critical Care Medicine

## 2021-08-11 NOTE — Telephone Encounter (Unsigned)
Copied from [place] [id number]. Topic: General - Other ?>> Aug 11, 2021 11:54 AM Leward Quan A wrote: ?Reason for CRM: Patient request a call back from [facility] today when he is available say that its important. Please call patient at Ph# [phone] ?

## 2021-08-11 NOTE — Telephone Encounter (Signed)
Copied from [place] [phone] [id number]. Topic: General - Other ?>> Aug 11, 2021 11:54 AM Joshua [name] wrote: ?Reason for CRM: Patient request [name] call back from [facility] today when he is available say that its important. Please call patient at Ph# [phone] ?

## 2021-08-12 NOTE — Telephone Encounter (Signed)
Returned patient's call. Introduced myself and the reason for my call. Mr. Cambridge wanted to let me know that his CBG at home over the last couple of days has been good. For example, this morning he checked and his fasting glucose was 130. He asked if he should continue his current dose of insulin, Trulicity, and Jardiance. This patient has schizoaffective disorder and sometimes needs to go over his medication instructions repeatedly for assurance. I reassured him that he can continue his current doses as his blood sugars are at goal. I emphasized that his medication helps his blood sugar stay at goal and he was amenable to continuing these. He thanked me and asked that I give his gratitude to Dr. Joya Gaskins for his care. Will forward to Dr. Joya Gaskins.    ?

## 2021-08-12 NOTE — Telephone Encounter (Signed)
Noted thank you

## 2021-08-21 ENCOUNTER — Telehealth: Payer: Self-pay | Admitting: Clinical

## 2021-08-21 NOTE — Telephone Encounter (Signed)
I contacted pt and spoke with him and his wife. I referred pt to [facility] for psychiatry and therapy. I sent the information to pt's mychart and encouraged him to call them if he does not here from them by next week.. ? ?He also mentioned that he thinks he was a bitten by a tic and thinks he is running a fever. He is requesting to speak with Dr. Joya Gaskins however I informed him that Dr. Joya Gaskins is not in the office today. I informed him that I would message the nurse.  ? ?He is also requesting an appt with [name] for his diabetes.  ?

## 2021-08-21 NOTE — Telephone Encounter (Signed)
He had insect delusions but may need UC visit or [facility] virtual visit and fu luke 3 wks

## 2021-08-23 ENCOUNTER — Encounter (HOSPITAL_COMMUNITY): Payer: Self-pay | Admitting: Emergency Medicine

## 2021-08-23 ENCOUNTER — Other Ambulatory Visit: Payer: Self-pay

## 2021-08-23 ENCOUNTER — Emergency Department (HOSPITAL_COMMUNITY)
Admission: EM | Admit: 2021-08-23 | Discharge: 2021-08-24 | Disposition: A | Discharge status: Home / Self Care | Payer: Medicare Other | Attending: Emergency Medicine | Admitting: Emergency Medicine

## 2021-08-23 DIAGNOSIS — Z046 Encounter for general psychiatric examination, requested by authority: Secondary | ICD-10-CM | POA: Diagnosis present

## 2021-08-23 DIAGNOSIS — F411 Generalized anxiety disorder: Secondary | ICD-10-CM | POA: Diagnosis present

## 2021-08-23 DIAGNOSIS — Z79899 Other long term (current) drug therapy: Secondary | ICD-10-CM | POA: Diagnosis not present

## 2021-08-23 DIAGNOSIS — J449 Chronic obstructive pulmonary disease, unspecified: Secondary | ICD-10-CM | POA: Diagnosis not present

## 2021-08-23 DIAGNOSIS — Z7984 Long term (current) use of oral hypoglycemic drugs: Secondary | ICD-10-CM | POA: Diagnosis not present

## 2021-08-23 DIAGNOSIS — F250 Schizoaffective disorder, bipolar type: Secondary | ICD-10-CM | POA: Diagnosis not present

## 2021-08-23 DIAGNOSIS — Z794 Long term (current) use of insulin: Secondary | ICD-10-CM | POA: Insufficient documentation

## 2021-08-23 DIAGNOSIS — F12950 Cannabis use, unspecified with psychotic disorder with delusions: Secondary | ICD-10-CM | POA: Insufficient documentation

## 2021-08-23 DIAGNOSIS — F12250 Cannabis dependence with psychotic disorder with delusions: Secondary | ICD-10-CM | POA: Diagnosis present

## 2021-08-23 DIAGNOSIS — F29 Unspecified psychosis not due to a substance or known physiological condition: Secondary | ICD-10-CM | POA: Diagnosis not present

## 2021-08-23 DIAGNOSIS — I10 Essential (primary) hypertension: Secondary | ICD-10-CM | POA: Diagnosis not present

## 2021-08-23 DIAGNOSIS — F1220 Cannabis dependence, uncomplicated: Secondary | ICD-10-CM | POA: Diagnosis present

## 2021-08-23 DIAGNOSIS — E119 Type 2 diabetes mellitus without complications: Secondary | ICD-10-CM | POA: Diagnosis not present

## 2021-08-23 DIAGNOSIS — Z20822 Contact with and (suspected) exposure to covid-19: Secondary | ICD-10-CM | POA: Diagnosis not present

## 2021-08-23 DIAGNOSIS — F1210 Cannabis abuse, uncomplicated: Secondary | ICD-10-CM

## 2021-08-23 LAB — COMPREHENSIVE METABOLIC PANEL
ALT: 34 U/L (ref 0–44)
AST: 26 U/L (ref 15–41)
Albumin: 3.8 g/dL (ref 3.5–5.0)
Alkaline Phosphatase: 122 U/L (ref 38–126)
Anion gap: 11 (ref 5–15)
BUN: 17 mg/dL (ref 8–23)
CO2: 24 mmol/L (ref 22–32)
Calcium: 9.7 mg/dL (ref 8.9–10.3)
Chloride: 101 mmol/L (ref 98–111)
Creatinine, Ser: 1.23 mg/dL (ref 0.61–1.24)
GFR, Estimated: >60 mL/min (ref >60)
Glucose, Bld: 122 mg/dL — ABNORMAL HIGH (ref 70–99)
Potassium: 4.3 mmol/L (ref 3.5–5.1)
Sodium: 136 mmol/L (ref 135–145)
Total Bilirubin: 0.4 mg/dL (ref 0.3–1.2)
Total Protein: 6.7 g/dL (ref 6.5–8.1)

## 2021-08-23 LAB — RAPID URINE DRUG SCREEN, HOSP PERFORMED
Amphetamines: NOT DETECTED
Barbiturates: NOT DETECTED
Benzodiazepines: NOT DETECTED
Cocaine: NOT DETECTED
Opiates: NOT DETECTED
Tetrahydrocannabinol: POSITIVE — AB

## 2021-08-23 LAB — CBG MONITORING, ED
Glucose-Capillary: 181 mg/dL — ABNORMAL HIGH (ref 70–99)
Glucose-Capillary: 167 mg/dL — ABNORMAL HIGH (ref 70–99)
Glucose-Capillary: 146 mg/dL — ABNORMAL HIGH (ref 70–99)

## 2021-08-23 LAB — CBC
HCT: 46.9 % (ref 39.0–52.0)
Hemoglobin: 15.4 g/dL (ref 13.0–17.0)
MCH: 30.3 pg (ref 26.0–34.0)
MCHC: 32.8 g/dL (ref 30.0–36.0)
MCV: 92.3 fL (ref 80.0–100.0)
Platelets: 216 10*3/uL (ref 150–400)
RBC: 5.08 MIL/uL (ref 4.22–5.81)
RDW: 14.1 % (ref 11.5–15.5)
WBC: 10 10*3/uL (ref 4.0–10.5)
nRBC: 0 % (ref 0.0–0.2)

## 2021-08-23 LAB — RESP PANEL BY RT-PCR (FLU A&B, COVID) ARPGX2
Influenza A by PCR: NEGATIVE
Influenza B by PCR: NEGATIVE
SARS Coronavirus 2 by RT PCR: NEGATIVE

## 2021-08-23 LAB — SALICYLATE LEVEL: Salicylate Lvl: <7 mg/dL — ABNORMAL LOW (ref 7.0–30.0)

## 2021-08-23 LAB — ACETAMINOPHEN LEVEL: Acetaminophen (Tylenol), Serum: <10 ug/mL — ABNORMAL LOW (ref 10–30)

## 2021-08-23 LAB — ETHANOL: Alcohol, Ethyl (B): <10 mg/dL (ref <10)

## 2021-08-23 MED ORDER — LORATADINE 10 MG PO TABS
10.0000 mg | ORAL_TABLET | Freq: Every day | ORAL | Status: DC
  Administered 2021-08-23 – 2021-08-24 (×2): 10 mg via ORAL
  Filled 2021-08-23 (×2): qty 1

## 2021-08-23 MED ORDER — IRBESARTAN 75 MG PO TABS
75.0000 mg | ORAL_TABLET | Freq: Every day | ORAL | Status: DC
  Administered 2021-08-23 – 2021-08-24 (×2): 75 mg via ORAL
  Filled 2021-08-23 (×2): qty 1

## 2021-08-23 MED ORDER — NICOTINE 21 MG/24HR TD PT24
21.0000 mg | MEDICATED_PATCH | Freq: Every day | TRANSDERMAL | Status: DC
  Administered 2021-08-24: 21 mg via TRANSDERMAL
  Filled 2021-08-23: qty 1

## 2021-08-23 MED ORDER — BASAGLAR KWIKPEN 100 UNIT/ML ~~LOC~~ SOPN: 36.0000 [IU] | PEN_INJECTOR | Freq: Every day | SUBCUTANEOUS | Status: DC

## 2021-08-23 MED ORDER — INSULIN GLARGINE-YFGN 100 UNIT/ML ~~LOC~~ SOLN
36.0000 [IU] | Freq: Every day | SUBCUTANEOUS | Status: DC
  Administered 2021-08-23 – 2021-08-24 (×2): 36 [IU] via SUBCUTANEOUS
  Filled 2021-08-23 (×2): qty 0.36

## 2021-08-23 MED ORDER — TRAZODONE HCL 100 MG PO TABS
200.0000 mg | ORAL_TABLET | Freq: Every evening | ORAL | Status: DC | PRN
  Administered 2021-08-23: 200 mg via ORAL
  Filled 2021-08-23: qty 4

## 2021-08-23 MED ORDER — EMPAGLIFLOZIN 25 MG PO TABS
25.0000 mg | ORAL_TABLET | Freq: Every day | ORAL | Status: DC
Start: 2021-08-23 — End: 2021-08-24
  Administered 2021-08-23 – 2021-08-24 (×2): 25 mg via ORAL
  Filled 2021-08-23 (×2): qty 1

## 2021-08-23 MED ORDER — TRAMADOL HCL 50 MG PO TABS
50.0000 mg | ORAL_TABLET | Freq: Four times a day (QID) | ORAL | Status: DC | PRN
  Administered 2021-08-23 – 2021-08-24 (×2): 50 mg via ORAL
  Filled 2021-08-23 (×2): qty 1

## 2021-08-23 MED ORDER — DICLOFENAC SODIUM 25 MG PO TBEC
50.0000 mg | DELAYED_RELEASE_TABLET | Freq: Two times a day (BID) | ORAL | Status: DC | PRN
  Filled 2021-08-23 (×2): qty 2

## 2021-08-23 MED ORDER — DULAGLUTIDE 1.5 MG/0.5ML ~~LOC~~ SOAJ: 1.5000 mg | SUBCUTANEOUS | Status: DC

## 2021-08-23 MED ORDER — GABAPENTIN 300 MG PO CAPS
600.0000 mg | ORAL_CAPSULE | Freq: Two times a day (BID) | ORAL | Status: DC
  Administered 2021-08-23 – 2021-08-24 (×3): 600 mg via ORAL
  Filled 2021-08-23 (×3): qty 2

## 2021-08-23 MED ORDER — NICOTINE 21 MG/24HR TD PT24
21.0000 mg | MEDICATED_PATCH | Freq: Once | TRANSDERMAL | Status: AC
  Administered 2021-08-23: 21 mg via TRANSDERMAL
  Filled 2021-08-23: qty 1

## 2021-08-23 MED ORDER — IPRATROPIUM-ALBUTEROL 0.5-2.5 (3) MG/3ML IN SOLN
3.0000 mL | Freq: Four times a day (QID) | RESPIRATORY_TRACT | Status: DC
  Administered 2021-08-24: 3 mL via RESPIRATORY_TRACT
  Filled 2021-08-23: qty 3

## 2021-08-23 MED ORDER — AEROCHAMBER PLUS FLO-VU LARGE MISC: 1.0000 | Freq: Once | Status: DC

## 2021-08-23 MED ORDER — ALBUTEROL SULFATE HFA 108 (90 BASE) MCG/ACT IN AERS
1.0000 | INHALATION_SPRAY | RESPIRATORY_TRACT | Status: DC | PRN
  Administered 2021-08-23: 2 via RESPIRATORY_TRACT
  Filled 2021-08-23: qty 6.7

## 2021-08-23 MED ORDER — HYDROXYZINE HCL 50 MG PO TABS
50.0000 mg | ORAL_TABLET | Freq: Three times a day (TID) | ORAL | Status: DC
  Administered 2021-08-23 – 2021-08-24 (×4): 50 mg via ORAL
  Filled 2021-08-23 (×2): qty 2
  Filled 2021-08-23: qty 1
  Filled 2021-08-23: qty 2

## 2021-08-23 NOTE — ED Notes (Signed)
Per psych, pt is recommended for obs 24 hrs.  ?

## 2021-08-23 NOTE — ED Provider Notes (Signed)
?[place] ?Provider Note ? ? ?CSN: [id number] ?Arrival date & time: 08/23/21  0319 ? ?  ? ?History ? ?Chief Complaint  ?Patient presents with  ? Psychiatric Evaluation  ? ? ?Joshua [name] is a 66 y.o. male. ? ?Pt is a 66 yo male with a pmhx significant for schizophrenia, bipolar d/o, htn, chronic pain, dm2, and cannabis use.  Pt was brought here this morning by the police due to IVC papers taken out by the family.  Pt said he is just drunk and high.  He said he was bitten by a black mamba to his RLE (no evidence of any bite).  He said he is full of ticks (no ticks visualized).  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   Pt does take Invega monthly.  It is unclear when he last had his injection. ? ? ?  ? ?Home Medications ?Prior to Admission medications   ?Medication Sig Start Date End Date Taking? Authorizing Provider  ?Accu-Chek FastClix Lancets MISC Use to check blood sugars three times per day 06/17/21   Elsie Stain, MD  ?azithromycin [facility]) 250 MG tablet Take two once then one daily until gone 08/10/21   Elsie Stain, MD  ?blood glucose meter kit and supplies KIT Dispense based on patient and insurance preference. Use up to four times daily as directed. ICD-10 E11.65  Z79.4 07/20/19   Fulp, Ander Gaster, MD  ?Blood Glucose Monitoring Suppl (ACCU-CHEK GUIDE ME) w/Device KIT Use to check blood sugars up to 3 times per day. ICD-10 E11.65, Z79.4 03/03/20   Fulp, Ander Gaster, MD  ?cetirizine (ZYRTEC) 10 MG tablet Take 1 tablet (10 mg total) by mouth daily. 01/19/21   Elsie Stain, MD  ?clotrimazole (LOTRIMIN) 1 % cream Apply to affected area 2 times daily 07/13/21   Pati Gallo S, PA  ?diclofenac (VOLTAREN) 50 MG EC tablet TAKE 1 TABLET BY MOUTH TWICE DAILY AS NEEDED FOR MODERATE PAIN. EAT BEFORE TAKING THIS MEDICATION 06/19/21   Elsie Stain, MD  ?Dulaglutide (TRULICITY) 1.5 IA/1.6PV SOPN Inject 1.5 mg into the skin once a week. 08/10/21   Elsie Stain, MD  ?gabapentin (NEURONTIN) 300 MG capsule Take 2 capsules (600 mg total) by mouth 2 (two) times daily. 08/10/21 10/09/21  Elsie Stain, MD  ?glucose blood (ACCU-CHEK GUIDE) test strip Use as instructed to check blood sugars three times per day. ICD-10 E11.65 and Z79.4 03/09/21   Elsie Stain, MD  ?hydrOXYzine (ATARAX) 50 MG tablet Take 1 tablet (50 mg total) by mouth 3 (three) times daily. 08/10/21   Elsie Stain, MD  ?Insulin Glargine [facility] KWIKPEN) 100 UNIT/ML Inject 36 Units into the skin daily. 08/10/21   Elsie Stain, MD  ?Insulin Pen Needle (TRUEPLUS PEN NEEDLES) 32G X 4 MM MISC Use to inject insulin once a day. 06/17/21   Elsie Stain, MD  ?[facility] SUSTENNA 156 MG/ML SUSY injection Inject into the muscle. 04/16/21   [provider]  ?ipratropium-albuterol (DUONEB) 0.5-2.5 (3) MG/3ML SOLN Take 3 mLs by nebulization every 6 (six) hours. 02/23/21   Elsie Stain, MD  ?JARDIANCE 25 MG TABS tablet Take 1 tablet (25 mg total) by mouth daily. 08/10/21   Elsie Stain, MD  ?Lancets Misc. (ACCU-CHEK FASTCLIX LANCET) KIT Use to check blood sugars 3 times per day 03/03/20   Fulp, Cammie, MD  ?nicotine (NICODERM CQ - DOSED IN MG/24 HOURS) 21 mg/24hr patch  Place 21 mg onto the skin daily.    [provider]  ?Respiratory Therapy Supplies (FLUTTER) DEVI Use with 4 times daily 03/27/19   Elsie Stain, MD  ?Skin Protectants, Misc. (EUCERIN) cream Apply topically as needed for dry skin. 07/09/21   Feliberto Gottron, FNP  ?Spacer/Aero-Holding Chambers (AEROCHAMBER MV) inhaler Use as instructed 03/27/19   Elsie Stain, MD  ?SUMAtriptan (IMITREX) 50 MG tablet Take 1 tablet (50 mg total) by mouth every 2 (two) hours as needed for migraine. May repeat in 2 hours if headache persists or recurs. 04/28/21   Elsie Stain, MD  ?traMADol (ULTRAM) 50 MG tablet Take 1 tablet (50 mg total) by mouth every 6 (six) hours as needed for moderate pain or severe pain. 08/05/21    Mayers, Cari S, PA-C  ?traZODone (DESYREL) 100 MG tablet Take 200 mg by mouth at bedtime as needed. 08/06/21   [provider]  ?valsartan (DIOVAN) 80 MG tablet Take 1 tablet (80 mg total) by mouth daily. 08/10/21   Elsie Stain, MD  ?VENTOLIN HFA 108 (90 Base) MCG/ACT inhaler Inhale 2 puffs into the lungs every 4 (four) hours as needed for wheezing or shortness of breath. 11/20/19   Elsie Stain, MD  ?   ? ?Allergies    ?Haldol [haloperidol], Metformin and related, and Trazodone and nefazodone   ? ?Review of Systems   ?Review of Systems  ?Psychiatric/Behavioral:  Positive for hallucinations.   ?All other systems reviewed and are negative. ? ?Physical Exam ?Updated Vital Signs ?BP (!) 147/100 (BP Location: Right Arm)   Pulse 98   Temp 97.8 ?F (36.6 ?C) (Axillary)   Resp 20   SpO2 98%  ?Physical Exam ?Vitals and nursing note reviewed.  ?Constitutional:   ?   Appearance: Normal appearance.  ?HENT:  ?   Head: Normocephalic and atraumatic.  ?   Right Ear: External ear normal.  ?   Left Ear: External ear normal.  ?   Nose: Nose normal.  ?   Mouth/Throat:  ?   Mouth: Mucous membranes are moist.  ?   Pharynx: Oropharynx is clear.  ?Eyes:  ?   Extraocular Movements: Extraocular movements intact.  ?   Conjunctiva/sclera: Conjunctivae normal.  ?   Pupils: Pupils are equal, round, and reactive to light.  ?Cardiovascular:  ?   Rate and Rhythm: Normal rate and regular rhythm.  ?   Pulses: Normal pulses.  ?   Heart sounds: Normal heart sounds.  ?Pulmonary:  ?   Breath sounds: Wheezing present.  ?Abdominal:  ?   General: Abdomen is flat. Bowel sounds are normal.  ?   Palpations: Abdomen is soft.  ?Musculoskeletal:     ?   General: Normal range of motion.  ?   Cervical back: Normal range of motion and neck supple.  ?Skin: ?   General: Skin is warm.  ?   Capillary Refill: Capillary refill takes less than 2 seconds.  ?Neurological:  ?   General: No focal deficit present.  ?   Mental Status: He is alert and  oriented to person, place, and time.  ?Psychiatric:     ?   Attention and Perception: He perceives visual hallucinations.     ?   Thought Content: Thought content is delusional.  ? ? ?ED Results / Procedures / Treatments   ?Labs ?(all labs ordered are listed, but only abnormal results are displayed) ?Labs Reviewed  ?COMPREHENSIVE METABOLIC PANEL - Abnormal; Notable for the  following components:  ?    Result Value  ? Glucose, Bld 122 (*)   ? All other components within normal limits  ?SALICYLATE LEVEL - Abnormal; Notable for the following components:  ? Salicylate Lvl <4.6 (*)   ? All other components within normal limits  ?ACETAMINOPHEN LEVEL - Abnormal; Notable for the following components:  ? Acetaminophen (Tylenol), Serum <10 (*)   ? All other components within normal limits  ?RAPID URINE DRUG SCREEN, HOSP PERFORMED - Abnormal; Notable for the following components:  ? Tetrahydrocannabinol POSITIVE (*)   ? All other components within normal limits  ?RESP PANEL BY RT-PCR (FLU A&B, COVID) ARPGX2  ?ETHANOL  ?CBC  ?CBG MONITORING, ED  ? ? ?EKG ?None ? ?Radiology ?No results found. ? ?Procedures ?Procedures  ? ? ?Medications Ordered in ED ?Medications  ?nicotine (NICODERM CQ - dosed in mg/24 hours) patch 21 mg (21 mg Transdermal Patch Applied 08/23/21 0908)  ?albuterol (VENTOLIN HFA) 108 (90 Base) MCG/ACT inhaler 1-2 puff (2 puffs Inhalation Given 08/23/21 0909)  ?AeroChamber Plus Flo-Vu Large MISC 1 each (1 each Other Not Given 08/23/21 0908)  ?loratadine (CLARITIN) tablet 10 mg (has no administration in time range)  ?diclofenac (VOLTAREN) EC tablet 50 mg (has no administration in time range)  ?Dulaglutide SOPN 1.5 mg (has no administration in time range)  ?gabapentin (NEURONTIN) capsule 600 mg (has no administration in time range)  ?hydrOXYzine (ATARAX) tablet 50 mg (has no administration in time range)  ?Basaglar KwikPen KwikPen 36 Units (has no administration in time range)  ?ipratropium-albuterol (DUONEB) 0.5-2.5 (3)  MG/3ML nebulizer solution 3 mL (has no administration in time range)  ?empagliflozin (JARDIANCE) tablet 25 mg (has no administration in time range)  ?nicotine (NICODERM CQ - dosed in mg/24 hours) patch 21 mg (has

## 2021-08-23 NOTE — ED Triage Notes (Signed)
Pt brought to ED by [employer] with IVC paperwork initiated by family. States pt has been noncompliant with medication, and thinks he is wolfman, a superhero that is here to save the world.  ?

## 2021-08-23 NOTE — BH Assessment (Signed)
Comprehensive Clinical Assessment (CCA) Note ? ?08/23/2021 ?[place] ?[id number] ? ?Disposition: Per Joshua Angel, NP, patient is recommended for overnight observation.  ? ?Joshua [name] from 08/23/2021 in [place] [name] from 07/23/2021 in [place] [name] from 07/13/2021 in [name]  ?C-SSRS RISK CATEGORY No Risk No Risk No Risk  ? ?  ? ?The patient demonstrates the following risk factors for suicide: Chronic risk factors for suicide include: psychiatric disorder of schizoaffective disorder, substance use disorder, and demographic factors (male, >6 y/o). Acute risk factors for suicide include: N/A. Protective factors for this patient include: positive social support, positive therapeutic relationship, responsibility to others (children, family), and hope for the future. Considering these factors, the overall suicide risk at this point appears to be low. Patient is not appropriate for outpatient follow up. ? ? ?Joshua [name] is a 66 year old male presenting to [facility] under IVC. Per IVC ?Respondent was last committed on an IVC in June 2022. Respondent did a voluntary eval in March 2023. Respondent has since refused to get an evaluation. Respondent is yelling at random people from his porch. Respondent is not tending to his hygiene. 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 Respondent will walk out into the street. His wife worried he will get hit by a car. Respondent is not taking his medications properly, and in addition is using marijuana. Without interventions respondent could harm himself.?   ? ?Patient reports at this time he feels ?nice and normal? and reports last night he took three sips of NyQuil and which made him drunk. Patient reports his family is saying that he is acting strange and smoking marijuana, but he denies using THC. Patient reports his ?strange behaviors?  consist of him listening to the radio and singing which he does not do. Patient confronted about his THC and TTS informed patient that his UDS was positive for THC. Patient the shares that he smokes marijuana almost daily and he last used yesterday. Patient reports he is smoking pineapple express, and he has been smoking since he was age 76. Patient also reports taking old hydroxyzine for anxiety. Patient reports he has a lot of old medications from over the years and about three days ago he noticed he had capsules of his current medication, hydroxyzine, so he decided to take the capsule instead of the pill. Patient reports taking about 8 '100mg'$  of hydroxyzine over the past three days. Patient also receives an [place] injection from [place]. Patient is trying to get his medication management changed over to [facility] but it has not happened yet.  ? ?TTS asked questions if he felt like he was a superhero or that he was wolfman. Patient reports people call him 'wolfman? because of his long hair and beard but he does not think that he is a wolfman. Patient denies thinking that he is a hero stating, ?I don't think I'm superman or nothing like that?, however patient feels that he and everyone has special gifts from God. Patient feels like God gave him the gift to love people. Patient reports that he is not hallucinating and reports when he is sitting on the porch, he says hi to the people walking and driving by. Patient states that he is accused of hallucinating all the time by his family. Patient reports he is sleeping well, and he is complaint with his medications. Patient reports he spoke to his wife, and she  is ready for him to come home.  ? ?Patient wife Joshua [name] reports that she has been married to patient for 36 years and this is not the firs time he has acted like this. Wife reports that patient is having ?crazy ideas? and behaviors such as taking gold paper out the inside of cigarette boxes and giving it to people  thinking it is real gold, blurting out that he is Joshua [name], Joshua [name] and other people, and about two weeks ago he walked to the fire department in his underwear just to talk with them. Wife reports that patient likes to go over to the fire department, but the problem is that the main road is busy and at one time he was holding up traffic by crossing the road. Wife reports he is ?bellowing, singing and hollering at random people?Marland Kitchen Wife reports that patient is also forgetful and loses things more. Patient at one point mixed all his medications in one pill bottle and his wife was not sure what medications patient took. Wife reports that she gives patient his medications daily. Wife reports the reason why she decided to IVC patient this time is because he decided that he was not going to bed and was getting ?worked up? stating that he wanted to sit on the porch. Wife states due to patient recent behaviors she went ahead and IVC'd patient. The only safety concerns are patient crossing the road, but she does not have any other safety concerns and will pick patient up if psych cleared. Patient last injection was 07/28/21 and he has an appointment to receive his next injection Thursday. ? ?Chief Complaint:  ?Chief Complaint  ?Patient presents with  ? Psychiatric Evaluation  ? ?Visit Diagnosis:  ?Psychosis, unspecified psychosis type ([place])  ?Marijuana abuse  ?  ? ? ?CCA Screening, Triage and Referral (STR) ? ?Patient Reported Information ?How did you hear about [place]? Legal System ? ?What Is the Reason for Your Visit/Call Today? Pt brought to [name] by [employer] with IVC paperwork initiated by family. States pt has been noncompliant with medication, and thinks he is wolfman, a superhero that is here to save the world. ? ?How Long Has This Been Causing You Problems? 1 wk - 1 month ? ?What Do You Feel Would Help You the Most Today? Treatment for Depression or other mood problem ? ? ?Have You Recently Had Any Thoughts About Hurting Yourself?  No ? ?Are You Planning to Commit Suicide/Harm Yourself At This time? No ? ? ?Have you Recently Had Thoughts About [place]? No ? ?Are You Planning to Harm Someone at This Time? No ? ?Explanation: No data recorded ? ?Have You Used Any Alcohol or Drugs in the Past 24 Hours? Yes ? ?How Long Ago Did You Use Drugs or Alcohol? No data recorded ?What Did You Use and How Much? yesterday ? ? ?Do You Currently Have a Therapist/Psychiatrist? Yes ? ?Name of Therapist/Psychiatrist: Josph Macho at [facility] ? ? ?Have You Been Recently Discharged From Any Office Practice or Programs? No ? ?Explanation of Discharge From Practice/Program: No data recorded ? ?  ?CCA Screening Triage Referral Assessment ?Type of Contact: Tele-Assessment ? ?Telemedicine Service Delivery: Telemedicine service delivery: This service was provided via telemedicine using a 2-way, interactive audio and video technology ? ?Is this Initial or Reassessment? Initial Assessment ? ?Date Telepsych consult ordered in [facility]:  08/23/21 ? ?Time Telepsych consult ordered in [facility]L:  2307 ? ?Location of Assessment: [name] ? ?Provider Location: [facility] Assessment Services ? ? ?  Collateral Involvement: Wife Derelle Cockrell ? ? ?Does Patient Have a Stage manager Guardian? No data recorded ?Name and Contact of Legal Guardian: No data recorded ?If Minor and Not Living with Parent(s), Who has Custody? No data recorded ?Is CPS involved or ever been involved? Never ? ?Is APS involved or ever been involved? Never ? ? ?Patient Determined To Be At Risk for Harm To Self or Others Based on Review of Patient Reported Information or Presenting Complaint? No ? ?Method: No data recorded ?Availability of Means: No data recorded ?Intent: No data recorded ?Notification Required: No data recorded ?Additional Information for Danger to Others Potential: No data recorded ?Additional Comments for Danger to Others Potential: No data recorded ?Are There Guns or Other Weapons in [place]? No data  recorded ?Types of Guns/Weapons: No data recorded ?Are These Weapons Safely Secured?                            No data recorded ?Who Could Verify You Are Able To Have These Secured: No data recorded ?Do Yo

## 2021-08-24 ENCOUNTER — Encounter (HOSPITAL_COMMUNITY): Payer: Self-pay | Admitting: Registered Nurse

## 2021-08-24 DIAGNOSIS — F411 Generalized anxiety disorder: Secondary | ICD-10-CM

## 2021-08-24 DIAGNOSIS — F250 Schizoaffective disorder, bipolar type: Secondary | ICD-10-CM

## 2021-08-24 DIAGNOSIS — F12250 Cannabis dependence with psychotic disorder with delusions: Secondary | ICD-10-CM | POA: Diagnosis present

## 2021-08-24 LAB — CBG MONITORING, ED
Glucose-Capillary: 142 mg/dL — ABNORMAL HIGH (ref 70–99)
Glucose-Capillary: 148 mg/dL — ABNORMAL HIGH (ref 70–99)

## 2021-08-24 MED ORDER — LORAZEPAM 1 MG PO TABS
1.0000 mg | ORAL_TABLET | Freq: Once | ORAL | Status: AC
  Administered 2021-08-24: 1 mg via ORAL
  Filled 2021-08-24: qty 1

## 2021-08-24 NOTE — ED Notes (Signed)
Breakfast Order Placed ?

## 2021-08-24 NOTE — Discharge Instructions (Addendum)
For your behavioral health needs you are advised to follow up with [facility].  You were recently referred to them, but you will need to call them to schedule an intake appointment.  Contact them at your earliest opportunity: ? ?     [facility] ?     [address] ?     [place], [place] [address] ?     [phone] ? ?If you do not plan to continue receiving long acting injectable medication from [place], contact [facility] for this service: ? ?     [facility] ?     [address]     [place], [place] [address] ?     [phone] ?     New patients are seen in their walk-in clinic.  Walk-in hours are Monday, Wednesday, Thursday and Friday from 8:00 am - 11:00 am for psychiatry.  Walk-in patients are seen on a first come, first served basis, so try to arrive as early as possible for the best chance of being seen the same day.  Please note that to be eligible for services you must bring an ID or a piece of mail with your name and a [facility] address. ? ? ? ?Substance Abuse Treatment Programs ? ?Intensive Outpatient Programs ?[place]     ?[place] [place]      ?[place], [place]                   ?[phone]      ? ?[facility] ?[address] ?[place], [place] ?[phone] ? ?[facility]     ?(Inpatient and outpatient)     ?[address]           ?[phone]   ? ?[facility] ?[phone] (Suboxone and Methadone) ? ?[address], [place] [address]      ?[phone]      ? ?[address] ?[place], [place] ?[place] ? ?[facility] (Outpatient/Inpatient, [facility])    ?(insurance only) [phone]      ?       ?[facility] (Groups & Residential) ?[place], [place] ?[phone] ? ?   ?[facility]     ?[place], [place]      ?[phone]      ? ?[facility]  (for caregivers and family) ?[address] ?[place], [place] ?[phone] ? ? ? ? ? ?Residential Treatment Programs ?[place]      ?[address] [place], [place] [address]  ?[phone]      ? ?[facility] ?[address], [place] [address] ?[phone] ? ?[facility] [place]        ?[phone]      ? ?[facility] ?[phone] ? ?[facility] ([facility]             ?[address]                                         ?[place], [place]                                                ?  [phone] or [phone]                              ? ?[facility] ?[address] ?[place], [address] ?[phone] ? ?[facility]    ?[place], [place]     ?[phone]      ? ?[facility] ?[place], [place] ?[phone] ? ?[facility]   ?[address]     ?[place], [place] [address]     ?[phone]      ?Admissions: 8am-3pm M-F ? ?[facility] (RTS) ?[address] ?[place], [place] ?[phone] ? ?[facility]: Residential Program (90 Days)   ?[place], [place]      ?[phone] or [phone]    ? ?[facility]: [place], [place] ?(Walk in Hours over the weekend or by referral) ? ?[facility] ?[place], [place], [place] [address] ?[phone] ? ?Crisis Mobile: [facility]:  [phone] (for crisis response 24 hours a day) ?[place] Hotline:      8065090497 ?Outpatient Psychiatry and Counseling ? ?[facility]: Mobile Crisis Management 24 hours:  [phone] ? ?[facility] of the [employer] sliding scale fee and walk in schedule: M-F 8am-12pm/1pm-3pm ?[place]  ?[place], [place] [address] ?[phone] ? ?[facility] ?[place] ?[place], [place] [address] ?[phone] ? ?[facility] ([facility])- new patient  walk-in appointments available Monday - Friday 8am -3pm.          ?[address] ?[place], [place] [address] ?[phone] or crisis line- [phone] ? ?[facility]/ Intensive Outpatient Therapy Program ?[address] ?[place], [place] [address] ?[phone] ? ?[place]                  ?Crisis Services      ?[phone]      ?[address]     ?[place], [place] [address]                ? ?[place]   ?[facility] ?[phone] ?[place] ?[place], [place] [address] ? ? ?[facility]          ?[address],  ?[place], [place] [address]       ?[phone] ? ?[facility] ?[address] ?[place], [place] [address] ?[phone] ? ?[facility]    ?[address]    ?[place], [place] [address]     ?[phone]      ? ?Sheralyn Boatman, MD     ?[address]     ?[place] [address]     ?[phone]     ?  ?[place] ?[place] [address] ? ?[facility]     ?[address]     ?[place], [place]      ?[phone]      ? ?[facility] ?Marlou Sa, MD ?[address] ?[place],  [address] ?[phone] ? ?[facility]     ?[address] [phone]     ?[place],  [address]     ?[phone]      ? ?[facility] ?[place],  [address] ?[phone] ?Resources for Temporary  Residential Assistance/Crisis Centers ? ?[facility] CENTERS ?[facility]) ?M-F 8am-3pm   ?[address] [place], [place] [address]   [phone] ?Services include: laundry, barbering, support groups, case management, phone  & computer access, showers, AA/NA mtgs, mental health/substance abuse nurse, job skills class, disability information, VA assistance, spiritual classes, etc.  ? ?[facility]OM[facility]ESS SHELTERS ? ?[facility]     ?[employer]   ?[address] [place]     ?[phone]       ?       ?[facility] (women and children)       ?[place], [place] [address] ?[phone] ?[email or web address] for application and process ?Application Required ? ?[facility]   ?[address]    ?[place] [address]     ?[phone]       ?             ?[facility] [place] ?[address] ?[place], [place] [address] ?[phone] ?[phone](DJMEQAST application appt.) ?Application Required ? ?[employer] [facility] (women only)    ?[place]     ?[place], [place] [address]     ?[phone]      ?Intake starts 6pm daily ?Need valid ID, SSC, & Police report ?[facility] ?[address] ?[place], [place] ?[phone] ?Application Required ? ?[employer] (men only)     ?[place]      [place], [place]     ?[phone]      ? ?[facility] ?(Pregnant women only) ?[place], [place] ?[phone] ? ?[facility]      ?[address]      [place], [place] [address]     ?[phone]      ?       ?[facility] ?[place], [place] ?[phone] ?90 day commitment/SA/Application process ? ?[facility](men only)     ?[place]     ?[place], [place]     ?[phone]       ?Check-in at 7pm     ?       ?[facility] [place] ?[address] ?[place], [place] [address] ?[phone] ?Men/Women/Women and Children must be there by 7 pm ? ?[facility] ?[place], [place] ?[phone]                ? ?

## 2021-08-24 NOTE — Telephone Encounter (Signed)
Appt scheduled for tomorrow.  °

## 2021-08-24 NOTE — Consult Note (Addendum)
Telepsych Consultation  ? ?Reason for Consult:  Delusional thinking ?Referring Physician:  Isla Pence, MD ?Location of Patient: [facility] ED ?Location of Provider: Other: [facility] ? 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  Generalized anxiety disorder ?  Schizoaffective disorder, bipolar type ([place]) ?  Cannabis use disorder, severe, dependence ([place]) ? ? ?Total Time spent with patient: 45 minutes ? ?Subjective:   ?Joshua [name] is a 66 y.o. male patient admitted to [facility] ED after present via [place] under IVC petition by family. ?Per IVC:   ?Respondent was last committed on an IVC in June 2022. Respondent did a voluntary eval in March 2023. Respondent has since refused to get an evaluation. Respondent is yelling at random people from his porch. Respondent is not tending to his hygiene. 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 Respondent will walk out into the street. His wife worried he will get hit by a car. Respondent is not taking his medications properly, and in addition is using marijuana. Without interventions respondent could harm himself.?   ? ?HPI:  Joshua [name], 66 y.o., male patient seen via tele health by this provider, consulted with Dr. [place] Abbot; and chart reviewed on 08/24/21.  On evaluation [place] reports he was brought to the hospital related to having an episode after "Taking a couple of sips if Nyquil.  I didn't take like 3 sips.  It was like one big gulp wit 3 swallows; and I took this pill that was black with white writing on it.  I think it was something that I had a prescription for years ago."  Patient reports he was also smoking "weed" which he reports smoking  everyday.  Patient states during his episodes "It was like my mind with speed real fast and then go backward.  It made me feel strong like out of the superhero.  I got it out of my system now."  Patient reports his mind is clear and he is feeling a lot better.  Patient reports he slept all night last night only waking up to get his vital signs taken this morning and then going back to sleep.  Reports he is eating without any difficulty and taking his medications as prescribed without any adverse reactions.  Patient reports that he lives with his wife but his son usually visits on the weekends.  Patient denies suicidal/self-harm/homicidal ideations, psychosis, paranoia.  Patient gave permission to speak to his wife for collateral information. ?During evaluation [name] is sitting up on bed with no distress noted.  He is alert, oriented x 4, calm and cooperative.  He was able to give the correct information for his date of birth, age, country, county, month, day of week, current date, and the last 3 presidents in order.  His mood is euthymic with congruent affect.  He does not appear to be responding to internal/external stimuli or delusional thoughts.  Patient denies suicidal/self-harm/homicidal ideation, psychosis, and paranoia.  Patient answered question appropriately. ?During evaluation [name] is sitting up in bed.  No distress noted.  He is alert/oriented x 4; calm/cooperative; and mood congruent with affect.  He is speaking in a clear tone at moderate volume, and normal pace; with good eye contact.  His thought process is coherent and relevant; There is no indication that he is currently responding to internal/external stimuli or experiencing delusional thought content; and he has denied suicidal/self-harm/homicidal ideation, psychosis, and paranoia.   ?Patient has remained calm throughout assessment and has answered questions appropriately.   ? ?Collateral Information:  Spoke with patients  wife Joshua [name] at [phone].    Wife states he smokes marijuana everyday and "I can tell when he is worse.  I done told him to stop."  States she spoke to patient yesterday and "He told me he was done with it" referring to marijuana).  Wife states that patient has an appointment at [facility] 08/25/21) to get Invega injection (last given was 07/28/21).  "But after that they suppose to be transferring his treatment to [facility] or some where."  Reports [facility] sent her a message today wanting to set up appointment with patient but she was just waiting to see what would happen before she called back.  ? ?Past Psychiatric History: See below ? ?Risk to Self:  Denies ?Risk to Others:  Denies ?Prior Inpatient Therapy:  Yes ?Prior Outpatient Therapy:  Yes ? ?Past Medical History:  ?Past Medical History:  ?Diagnosis Date  ? Acute recurrent maxillary sinusitis 01/19/2021  ? Back pain   ? Bipolar disorder ([place])   ? Cannabis use disorder, moderate, dependence ([place])   ? 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 Chronic pain   ? Diabetes mellitus without complication ([place])   ? Generalized anxiety disorder   ? Headache   ? Hypertension   ? Schizoaffective disorder ([place])   ?  ?Past Surgical History:  ?Procedure Laterality Date  ? APPENDECTOMY  1970  ? ?Family History:  ?Family History  ?Problem Relation Age of Onset  ? Diabetes Mother   ? Cancer Mother   ?     unsure what type  ? Heart disease Mother   ? Hypertension Mother   ? Dementia Father   ? Diabetes Brother   ? Dementia Maternal Grandfather   ? Cancer Paternal Grandmother 38  ?     breast  ? Mental illness Neg Hx   ? ?Family Psychiatric  History: See above ?Social History:  ?Social History  ? ?Substance and Sexual Activity  ?Alcohol Use Not Currently  ?   ?Social History  ? ?Substance and Sexual Activity  ?Drug Use Yes  ? Types: Marijuana  ? Comment: "have changed to hemp now" 09/17/17  ?  ?Social History  ? ?Socioeconomic History  ? Marital  status: Married  ?  Spouse name: Not on file  ? Number of children: Not on file  ? Years of education: Not on file  ? Highest education level: Not on file  ?Occupational History  ? Not on file  ?Tobacco Use  ? Smoking status: Every Day  ?  Packs/day: 2.00  ?  Types: Cigarettes  ? Smokeless tobacco: Never  ?Vaping Use  ? Vaping Use: Never used  ?Substance and Sexual Activity  ? Alcohol use: Not Currently  ? Drug use: Yes  ?  Types: Marijuana  ?  Comment: "have changed to hemp now" 09/17/17  ? Sexual activity: Not on file  ?Other Topics Concern  ? Not on file  ?Social History Narrative  ? Not on file  ? ?Social Determinants of Health  ? ?Financial Resource Strain: Not on file  ?Food Insecurity: Not on file  ?Transportation Needs: Not on file  ?Physical Activity: Not on file  ?  Stress: Not on file  ?Social Connections: Not on file  ? ?Additional Social History: ?  ? ?Allergies:   ?Allergies  ?Allergen Reactions  ? Haldol [Haloperidol] Other (See Comments)  ?  Unk reaction  ? Metformin And Related Diarrhea and Nausea And Vomiting  ? Trazodone And Nefazodone Itching  ? ? ?Labs:  ?Results for orders placed or performed during the hospital encounter of 08/23/21 (from the past 48 hour(s))  ?Rapid urine drug screen (hospital performed)     Status: Abnormal  ? Collection Time: 08/23/21  3:24 AM  ?Result Value Ref Range  ? Opiates NONE DETECTED NONE DETECTED  ? Cocaine NONE DETECTED NONE DETECTED  ? Benzodiazepines NONE DETECTED NONE DETECTED  ? Amphetamines NONE DETECTED NONE DETECTED  ? Tetrahydrocannabinol POSITIVE (A) NONE DETECTED  ? Barbiturates NONE DETECTED NONE DETECTED  ?  Comment: (NOTE) ?DRUG SCREEN FOR MEDICAL PURPOSES ?ONLY.  IF CONFIRMATION IS NEEDED ?FOR ANY PURPOSE, NOTIFY LAB ?WITHIN 5 DAYS. ? ?LOWEST DETECTABLE LIMITS ?FOR URINE DRUG SCREEN ?Drug Class                     Cutoff (ng/mL) ?Amphetamine and metabolites    1000 ?Barbiturate and metabolites    200 ?Benzodiazepine                 200 ?Tricyclics and  metabolites     300 ?Opiates and metabolites        300 ?Cocaine and metabolites        300 ?THC                            50 ?Performed at [facility], [place] [address] [place], [place] ?[address]

## 2021-08-24 NOTE — ED Notes (Signed)
Pt is resting on back with eyes closed. NAD noted, equal and symmetrical RR noted. Care plan continued.  ?

## 2021-08-24 NOTE — BH Assessment (Addendum)
[place] Assessment Progress Note ?  ?Per Shuvon Rankin, NP, this pt does not require psychiatric hospitalization at this time.  Pt is psychiatrically cleared.  Pt was recently seen at the [facility] and [facility], and was referred to [facility].  This writer was asked to call them to inquire about an intake appointment.  Call was placed at 11:21 and I spoke to [facility].  She reports that he was, in fact, referred to them, but an appointment was not scheduled; he will need to call them himself to schedule an appointment.  Contact information for them has been included in pt's discharge instructions with recommendation that he call them at his earliest opportunity.  Shuvon has been notified, along with pt's nurses, Marta Antu and [facility]. ? ?Jalene Mullet, MA ?Triage Specialist ?[phone] ? ?Addendum: ? ?This Probation officer later found that pt has been receiving psychiatry services including a long acting injectable medication from [facility].  He plans to discontinue [facility], however.  Contact information for [facility] has been added to pt's discharge instructions to address this need.  Shuvon and pt's nurses have been notified. ? ?Jalene Mullet, MA ?Behavioral Health Coordinator ?[phone]  ?

## 2021-08-25 ENCOUNTER — Ambulatory Visit: Payer: Self-pay | Admitting: *Deleted

## 2021-08-25 ENCOUNTER — Ambulatory Visit: Payer: Medicare Other | Admitting: Pharmacist

## 2021-08-25 NOTE — Telephone Encounter (Signed)
2nd attempt to contact patient to review headache sx. No answer, LVMTCB [phone]. ?

## 2021-08-25 NOTE — Telephone Encounter (Signed)
Summary: Medication Managment - Headache  ? Patient had to cancel his 4 pm appointment scheduled for today with [name] at [facility] due to him experiencing headaches for 1 day.  ? ?Patient states he think the headaches may be contributed to him smoking while on 21 MG Nicotine patch. Patient states PCP needs to increase 21 MG to 26 MG Nicotine patch because the 26 MG worked fine while being in the hospital. Patient states he feels like Trulicity is not working and would like PCP to prescribe chantix, patient also states he thinks that may be the cause of his headaches   ?  ?Called pt - LMOM to return call. ?

## 2021-08-25 NOTE — Telephone Encounter (Signed)
?  Chief Complaint: HA was reason for triage. Pt has had these HAs all of his life.  ?Symptoms:  ?Frequency:  ?Pertinent Negatives: Patient denies New HA ?Disposition: '[]'$ ED /'[]'$ Urgent Care (no appt availability in office) / '[]'$ Appointment(In office/virtual)/ '[]'$  [place] [facility]/ '[]'$ Home Care/ '[]'$ Refused Recommended Disposition /'[]'$ [place] Mobile Bus/ '[x]'$  Follow-up with PCP ?Additional Notes: Although triage was for HA. The HA that pt discussed have been present all of his life. ?Pt was really calling regarding medications. ? ?PT would like 26 Mg nicotine patch to help with smoking cessation. He would also like Chantix prescribed.  Pt believes that Chantix is for diabetes. When I told him that it was for smoking cessation - he said he would like that. He stated he saw a commercial on TV that said Chantix was for diabetes.  ? ?Pt thinks that Trulicity may not be working for him anymore.  ? ?Pt would also like for you to consider changing his HTN medication because he does not think it is working. He can't feel his heart beating any more. Discussed past few BP readings and pt, would still like to give you the opportunity to change medication if you think it is appropriate.  ? ?Please advise. ?Reason for Disposition ?? [1] Caller has NON-URGENT medicine question about med that PCP prescribed AND [2] triager unable to answer question ? ?Answer Assessment - Initial Assessment Questions ?1. DRUG NAME: "What medicine do you need to have refilled?" ?    Pt would like Chantix, Nicotine patch of 26 mg and would like to see if his HTN medication can be changed. ?2. REFILLS REMAINING: "How many refills are remaining?" (Note: The label on the medicine or pill bottle will show how many refills are remaining. If there are no refills remaining, then a renewal may be needed.) ?     ?3. EXPIRATION DATE: "What is the expiration date?" (Note: The label states when the prescription will expire, and thus can no longer be  refilled.) ?     ?4. PRESCRIBING HCP: "Who prescribed it?" Reason: If prescribed by specialist, call should be referred to that group. ?     ?5. SYMPTOMS: "Do you have any symptoms?" ?     ?6. PREGNANCY: "Is there any chance that you are pregnant?" "When was your last menstrual period?" ? ?Protocols used: Medication Refill and Renewal Call-A-AH ? ?

## 2021-08-25 NOTE — Telephone Encounter (Signed)
Summary: Medication Managment - Headache  ? Patient had to cancel his 4 pm appointment scheduled for today with [name] at [facility] due to him experiencing headaches for 1 day.  ? ?Patient states he think the headaches may be contributed to him smoking while on 21 MG Nicotine patch. Patient states PCP needs to increase 21 MG to 26 MG Nicotine patch because the 26 MG worked fine while being in the hospital. Patient states he feels like Trulicity is not working and would like PCP to prescribe chantix, patient also states he thinks that may be the cause of his headaches   ?  ? ? ?Called patient to review sx of headaches. No answer, LVMTCB [phone]. ?

## 2021-08-26 NOTE — Telephone Encounter (Signed)
Please advise, double book or tele visit.  ?Did not show for appointment 5/2 with [facility].  ?

## 2021-08-26 NOTE — Telephone Encounter (Signed)
Can double book tomorrow , I have early afternoon appt at 50 today. Telehealth /video/phone is ok ?

## 2021-09-01 ENCOUNTER — Ambulatory Visit: Payer: Medicare Other | Attending: Critical Care Medicine | Admitting: Critical Care Medicine

## 2021-09-01 ENCOUNTER — Other Ambulatory Visit: Payer: Self-pay

## 2021-09-01 ENCOUNTER — Encounter: Payer: Self-pay | Admitting: Critical Care Medicine

## 2021-09-01 ENCOUNTER — Telehealth: Payer: Self-pay | Admitting: Critical Care Medicine

## 2021-09-01 VITALS — BP 151/96 | HR 84 | Wt 236.2 lb

## 2021-09-01 DIAGNOSIS — G8929 Other chronic pain: Secondary | ICD-10-CM | POA: Diagnosis not present

## 2021-09-01 DIAGNOSIS — E1165 Type 2 diabetes mellitus with hyperglycemia: Secondary | ICD-10-CM | POA: Insufficient documentation

## 2021-09-01 DIAGNOSIS — F39 Unspecified mood [affective] disorder: Secondary | ICD-10-CM | POA: Insufficient documentation

## 2021-09-01 DIAGNOSIS — F12250 Cannabis dependence with psychotic disorder with delusions: Secondary | ICD-10-CM

## 2021-09-01 DIAGNOSIS — J449 Chronic obstructive pulmonary disease, unspecified: Secondary | ICD-10-CM | POA: Insufficient documentation

## 2021-09-01 DIAGNOSIS — Z7985 Long-term (current) use of injectable non-insulin antidiabetic drugs: Secondary | ICD-10-CM | POA: Diagnosis not present

## 2021-09-01 DIAGNOSIS — G894 Chronic pain syndrome: Secondary | ICD-10-CM

## 2021-09-01 DIAGNOSIS — F12159 Cannabis abuse with psychotic disorder, unspecified: Secondary | ICD-10-CM | POA: Diagnosis not present

## 2021-09-01 DIAGNOSIS — Z79899 Other long term (current) drug therapy: Secondary | ICD-10-CM | POA: Insufficient documentation

## 2021-09-01 DIAGNOSIS — I10 Essential (primary) hypertension: Secondary | ICD-10-CM | POA: Diagnosis not present

## 2021-09-01 DIAGNOSIS — Z91148 Patient's other noncompliance with medication regimen for other reason: Secondary | ICD-10-CM | POA: Insufficient documentation

## 2021-09-01 DIAGNOSIS — Z91119 Patient's noncompliance with dietary regimen due to unspecified reason: Secondary | ICD-10-CM | POA: Insufficient documentation

## 2021-09-01 DIAGNOSIS — Z794 Long term (current) use of insulin: Secondary | ICD-10-CM | POA: Diagnosis not present

## 2021-09-01 DIAGNOSIS — Z7984 Long term (current) use of oral hypoglycemic drugs: Secondary | ICD-10-CM | POA: Insufficient documentation

## 2021-09-01 DIAGNOSIS — M79605 Pain in left leg: Secondary | ICD-10-CM | POA: Diagnosis present

## 2021-09-01 DIAGNOSIS — F259 Schizoaffective disorder, unspecified: Secondary | ICD-10-CM | POA: Insufficient documentation

## 2021-09-01 DIAGNOSIS — F17210 Nicotine dependence, cigarettes, uncomplicated: Secondary | ICD-10-CM | POA: Insufficient documentation

## 2021-09-01 DIAGNOSIS — F17200 Nicotine dependence, unspecified, uncomplicated: Secondary | ICD-10-CM

## 2021-09-01 DIAGNOSIS — M545 Low back pain, unspecified: Secondary | ICD-10-CM

## 2021-09-01 DIAGNOSIS — F250 Schizoaffective disorder, bipolar type: Secondary | ICD-10-CM

## 2021-09-01 LAB — GLUCOSE, POCT (MANUAL RESULT ENTRY): POC Glucose: 160 mg/dl — AB (ref 70–99)

## 2021-09-01 MED ORDER — VALSARTAN-HYDROCHLOROTHIAZIDE 160-25 MG PO TABS: 1.0000 | ORAL_TABLET | Freq: Every day | ORAL | 3 refills | Status: DC

## 2021-09-01 MED ORDER — TRAMADOL HCL 50 MG PO TABS: 50.0000 mg | ORAL_TABLET | Freq: Four times a day (QID) | ORAL | 0 refills | Status: DC | PRN

## 2021-09-01 NOTE — Progress Notes (Signed)
? ?Established Patient Office Visit ? ?Subjective:  ?Patient ID: Joshua [name], male    DOB: Dec 20, 1955  Age: 66 y.o. MRN: [id number] ? ?CC:  ?Pain in left lower extremity ? ?HPI ?04/28/21 ?Joshua [name] presents for primary care follow-up for diabetes and hypertension.  Patient continues to smoke 5 to 6 cigarettes daily at this time.  He has not been compliant with his diet occasionally his fasting sugars are 265-220.  When I review his glucometer his blood sugar ranges anywhere from 1 80-2 10 in the morning fasting.  He self increased his insulin glargine to [name] units a day.  He ran out of refills and cannot get an early refill with this medication. ? ?The patient wishes to hold off on his pneumonia vaccine at this visit.  Patient does have a Cologuard kit at home he states he will have this process soon. ? ?Patient is needing a new nebulizer cup and mouthpiece for his nebulizer machine.  He does maintain the Trulicity weekly along with the insulin glargine and the Jardiance 25 mg daily note he cannot tolerate metformin due to side effects. ? ?Patient has a chronic pain management contract that he has been compliant with and needs refills on tramadol.  Drug database was checked and showed no frequent multiple prescribers no evidence of drug diversion ? ?Patient's mental health status is stable at this time he is on monthly Invega injections at this time ? ?4/17 ?Patient returns for primary care follow-up with complaints of pain in the right and left lower extremity.   ?Patient with history of significant delusion.  He thinks he has been bit by Joshua [name].  Patient also is concerned about tick infestation. ?Patient is followed by Joshua [name] and receives Joshua [name] injections on a monthly basis.  He would like to receive these here but was explained that he needs to stay with his mental health provider for same. ? ?Patient is having increased wheezing and productive cough of green mucus.  He is still actively  smoking. ? ?09/01/21 ?Patient is seen as a work in post ER visit.  He was sent by the police upon urging of his spouse Joshua [name] to the emergency room at [facility] for evaluation by psychiatry.  He had wildly psychotic ideation.  He thought he was [employer], [employer].  He was screaming out at neighbors and bothering him.  Patient was seen by psychiatry no intervention made and was declared to not be a threat to himself or others and was discharged home.  He has since been to [facility] and receives his monthly Invega injection although at a lower dose. ?The patient has been referred to the below mental Joshua [name] but the patient's spouse is unclear where to go and needs this information to make the appointment.  The patient is here today by himself. ?[facility] ?[address], [place], [place] [address] ?[phone] ?Your referral was sent here for therapy and psychiatry ? ?Today the patient is fairly cooperative however he is definitely psychotic he thinks he sees ghosts and snakes and sees things moving in the room.  He is cooperative and is not suicidal or homicidal.  Blood pressure initially on arrival was 170/105 but on recheck was 151/96.  He is only on the valsartan 80 mg daily. ?Patient is still actively smoking ?Past Medical History:  ?Diagnosis Date  ? Acute recurrent maxillary sinusitis 01/19/2021  ? Back pain   ? Bipolar disorder ([place])   ?  Cannabis use disorder, moderate, dependence ([place])   ? 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 Chronic pain   ? Diabetes mellitus without complication ([place])   ? Generalized anxiety disorder   ? Headache   ? Hypertension   ? Schizoaffective disorder ([place])   ? ? ?Past Surgical History:  ?Procedure Laterality Date  ? APPENDECTOMY  1970  ? ? ?Family History  ?Problem Relation Age of Onset  ? Diabetes Mother   ? Cancer Mother   ?     unsure what type  ? Heart disease Mother   ? Hypertension Mother   ? Dementia  Father   ? Diabetes Brother   ? Dementia Maternal Grandfather   ? Cancer Paternal Grandmother 62  ?     breast  ? Mental illness Neg Hx   ? ? ?Social History  ? ?Socioeconomic History  ? Marital status: Married  ?  Spouse name: Not on file  ? Number of children: Not on file  ? Years of education: Not on file  ? Highest education level: Not on file  ?Occupational History  ? Not on file  ?Tobacco Use  ? Smoking status: Every Day  ?  Packs/day: 2.00  ?  Types: Cigarettes  ? Smokeless tobacco: Never  ?Vaping Use  ? Vaping Use: Never used  ?Substance and Sexual Activity  ? Alcohol use: Not Currently  ? Drug use: Yes  ?  Types: Marijuana  ?  Comment: "have changed to hemp now" 09/17/17  ? Sexual activity: Not on file  ?Other Topics Concern  ? Not on file  ?Social History Narrative  ? Not on file  ? ?Social Determinants of Health  ? ?Financial Resource Strain: Not on file  ?Food Insecurity: Not on file  ?Transportation Needs: Not on file  ?Physical Activity: Not on file  ?Stress: Not on file  ?Social Connections: Not on file  ?Intimate Partner Violence: Not on file  ? ? ?Outpatient Medications Prior to Visit  ?Medication Sig Dispense Refill  ? Accu-Chek FastClix Lancets MISC Use to check blood sugars three times per day 102 each 11  ? blood glucose meter kit and supplies KIT Dispense based on patient and insurance preference. Use up to four times daily as directed. ICD-10 E11.65  Z79.4 1 each 0  ? Blood Glucose Monitoring Suppl (ACCU-CHEK GUIDE ME) w/Device KIT Use to check blood sugars up to 3 times per day. ICD-10 E11.65, Z79.4 1 kit 0  ? cetirizine (ZYRTEC) 10 MG tablet Take 1 tablet (10 mg total) by mouth daily. [name] tablet 11  ? clotrimazole (LOTRIMIN) 1 % cream Apply to affected area 2 times daily 15 g 0  ? diclofenac (VOLTAREN) 50 MG EC tablet TAKE 1 TABLET BY MOUTH TWICE DAILY AS NEEDED FOR MODERATE PAIN. EAT BEFORE TAKING THIS MEDICATION (Patient taking differently: Take 50 mg by mouth 2 (two) times daily as needed  for mild pain.) 60 tablet 2  ? Dulaglutide (TRULICITY) 1.5 AS/5.0NL SOPN Inject 1.5 mg into the skin once a week. 2 mL 2  ? gabapentin (NEURONTIN) 300 MG capsule Take 2 capsules (600 mg total) by mouth 2 (two) times daily. 240 capsule 3  ? glucose blood (ACCU-CHEK GUIDE) test strip Use as instructed to check blood sugars three times per day. ICD-10 E11.65 and Z79.4 100 each 11  ? hydrOXYzine (ATARAX) 50 MG tablet Take 1 tablet (50 mg total) by mouth 3 (three) times daily. 90 tablet 2  ? Insulin Glargine (  BASAGLAR KWIKPEN) 100 UNIT/ML Inject 36 Units into the skin daily. 12 mL 2  ? Insulin Pen Needle (TRUEPLUS PEN NEEDLES) 32G X 4 MM MISC Use to inject insulin once a day. 100 each 2  ? INVEGA SUSTENNA 156 MG/ML SUSY injection Inject 156 mg into the muscle every [name] (thirty) days.    ? ipratropium-albuterol (DUONEB) 0.5-2.5 (3) MG/3ML SOLN Take 3 mLs by nebulization every 6 (six) hours. (Patient taking differently: Take 3 mLs by nebulization every 6 (six) hours as needed (shortness of breath).) 360 mL 0  ? JARDIANCE 25 MG TABS tablet Take 1 tablet (25 mg total) by mouth daily. [name] tablet 2  ? Lancets Misc. (ACCU-CHEK FASTCLIX LANCET) KIT Use to check blood sugars 3 times per day 1 kit 11  ? Respiratory Therapy Supplies (FLUTTER) DEVI Use with 4 times daily 1 each 0  ? Skin Protectants, Misc. (EUCERIN) cream Apply topically as needed for dry skin. 454 g 0  ? Spacer/Aero-Holding Chambers (AEROCHAMBER MV) inhaler Use as instructed 1 each 0  ? SUMAtriptan (IMITREX) 50 MG tablet Take 1 tablet (50 mg total) by mouth every 2 (two) hours as needed for migraine. May repeat in 2 hours if headache persists or recurs. 20 tablet 1  ? traMADol (ULTRAM) 50 MG tablet Take 1 tablet (50 mg total) by mouth every 6 (six) hours as needed for moderate pain or severe pain. 120 tablet 0  ? traZODone (DESYREL) 100 MG tablet Take 200 mg by mouth at bedtime as needed for sleep.    ? VENTOLIN HFA 108 (90 Base) MCG/ACT inhaler Inhale 2 puffs into  the lungs every 4 (four) hours as needed for wheezing or shortness of breath. 18 g 3  ? azithromycin (ZITHROMAX) 250 MG tablet Take two once then one daily until gone 6 tablet 0  ? valsartan (DIOVAN) 80 MG tablet

## 2021-09-01 NOTE — Assessment & Plan Note (Signed)
Not able to engage this patient on smoking cessation at this visit ?

## 2021-09-01 NOTE — Assessment & Plan Note (Signed)
Has low-grade wheezing we will continue current inhaled medicines this is exacerbated by smoking ?

## 2021-09-01 NOTE — Patient Instructions (Addendum)
Change valsartan to valsartan HCT one daily  sent to your [facility] ? ?Our case manager will call your wife to connect you to new psychiatry office, they have your referral ? ?Your blood sugars are better , no change in your diabetic medications ? ?Return Dr Joya Gaskins 2 months ? ?The mental health clinic is: ? ?[facility] ?[address], [place], [place] [address] ?[phone] ?Your referral was sent here for therapy and psychiatry ? ?

## 2021-09-01 NOTE — Telephone Encounter (Signed)
Call placed to patient's wife , Abigail Butts [phone], this is also the phone number for patient. Need to inform her that Dennison Mascot, LCSW referred her husband to [facility] , [address], [place], [place]  and instruct her to call their office [phone] to schedule an appointment for her husband.  ?Message left with call back requested to this CM ?

## 2021-09-01 NOTE — Telephone Encounter (Signed)
Call received from the patient and his wife.  I explained to them that they need to call [facility] to schedule the behavioral health appointment for patient.  His wife said she would call and she has the phone number on the AVS from today. ? ?The patient then said that he has a headache. He took his valsartan this morning and then picked up the new prescription for valsartan and took that this afternoon.  He said his headache is worse. He would like to know if Dr Joya Gaskins could put him on something else.  I spoke to Lunette Stands who stated that the headaches are from his hypertension and he needs to take his medication as ordered. I then explained this to the patient. ? ?The patient also requests a refill of tramadol. After speaking with Dr Joya Gaskins, I informed the patient that Dr Joya Gaskins allowed  an early refill of tramadol for tomorrow at [facility].  In the meantime, he can take an extra strength tylenol for his headache   ? ?He was very appreciative ?

## 2021-09-01 NOTE — Assessment & Plan Note (Signed)
Blood sugar well controlled today his glucometer shows good numbers we will not make changes in medicine ?

## 2021-09-01 NOTE — Assessment & Plan Note (Signed)
Not well controlled owing to patient's agitation and psychiatric state and ongoing smoking ? ?We will increase valsartan to 160 mg daily and add hydrochlorothiazide 25 mg to each tablet ?

## 2021-09-01 NOTE — Assessment & Plan Note (Signed)
At the last emergency room visit he did have cannabis in the system I think he does tend to overuse cannabis which is exacerbating his mood disorder and psychiatric disorder ?

## 2021-09-01 NOTE — Assessment & Plan Note (Signed)
Behavioral health at the hospital does not think he is a threat to the community or himself however he is still actively hallucinating ? ?I am partnering with our nurse case manager to connect with the wife and give her the information of the new psychiatric center we are referring the patient .  I would not be surprised if he ends up back in the emergency room. ? ?This patient would really benefit from more intensive community-based psychiatric care and support ?

## 2021-09-01 NOTE — Telephone Encounter (Signed)
Below is the clinic [facility] sent a referral can you contact the wife and let her know so she can call to follow-up on this ? ?[facility] ?[address], [place], [place] [address] ?[phone] ?

## 2021-09-01 NOTE — Addendum Note (Signed)
Addended by: Asencion Noble E on: 09/01/2021 04:54 PM ? ? Modules accepted: Orders ? ?

## 2021-09-04 ENCOUNTER — Ambulatory Visit: Payer: Self-pay | Admitting: *Deleted

## 2021-09-04 ENCOUNTER — Telehealth: Payer: Self-pay | Admitting: Critical Care Medicine

## 2021-09-04 NOTE — Telephone Encounter (Signed)
Unable to see patient today. Did not have openings for an appt.  ? ?

## 2021-09-04 NOTE — Telephone Encounter (Signed)
Reason for Disposition ? [1] Morning (before breakfast) blood glucose < 80 mg/dL (4.4 mmol/L) AND [2] more than once in past week ? ?Answer Assessment - Initial Assessment Questions ?1. SYMPTOMS: "What symptoms are you concerned about?" ?    Low glucose reading.  ?2. ONSET:  "When did the symptoms start?" ?    53-fasting ?3. BLOOD GLUCOSE: "What is your blood glucose level?"  ?    53-fasting, 99 after coffee- no insulin this morning, 120-9:46 ?4. USUAL RANGE: "What is your blood glucose level usually?" (e.g., usual fasting morning value, usual evening value) ?    Not sure-does not check daily ?5. TYPE 1 or 2:  "Do you know what type of diabetes you have?"  (e.g., Type 1, Type 2, Gestational; doesn't know)  ?    Type 2 ?6. INSULIN: "Do you take insulin?" "What type of insulin(s) do you use? What is the mode of delivery? (syringe, pen; injection or pump) "When did you last give yourself an insulin dose?" (i.e., time or hours/minutes ago) "How much did you give?" (i.e., how many units) ?    yes ?7. DIABETES PILLS: "Do you take any pills for your diabetes?" ?    yes ?8. OTHER SYMPTOMS: "Do you have any symptoms?" (e.g., fever, frequent urination, difficulty breathing, vomiting) ?    no ?9. LOW BLOOD GLUCOSE TREATMENT: "What have you done so far to treat the low blood glucose level?" ?    Drinking coffee with sweeter ?10. FOOD: "When did you last eat or drink?" ?      Yesterday- food- this morning coffee ?11. ALONE: "Are you alone right now or is someone with you?"  ?      No- wife is present ?12. PREGNANCY: "Is there any chance you are pregnant?" "When was your last menstrual period?" ? ?Protocols used: Diabetes - Low Blood Sugar-A-AH ? ?

## 2021-09-04 NOTE — Telephone Encounter (Signed)
?  Chief Complaint: Patient requesting to speak to [facility] ?Symptoms: low fasting glucose level this morning- did not use insulin today- level did increase with coffee/creamer ?Frequency: unsure how often has low glucose in am- does not check fasting daily ?Pertinent Negatives: Patient denies   ?Disposition: '[]'$ ED /'[]'$ Urgent Care (no appt availability in office) / '[]'$ Appointment(In office/virtual)/ '[]'$  [facility]/ '[]'$ Home Care/ '[]'$ Refused Recommended Disposition /'[]'$ [place] Mobile Bus/ '[]'$  Follow-up with PCP ?Additional Notes: Patient request call back from [facility]- Patient does not want to take insulin anymore, patient wants to take Ozempic, patient does not want to gain weight- does not eat regular diet ?

## 2021-09-04 NOTE — Telephone Encounter (Signed)
Pt called requesting to start Ozempic, he says he is coming to the office today at 1:30 pm. Tried calling flow coordinator, no answer. Sent Teams message as well.  ? ?[facility] [employer], [place] [address]  ?Phone: [phone] Fax: [phone]  ? ?

## 2021-09-09 ENCOUNTER — Ambulatory Visit: Payer: Medicare Other | Attending: Critical Care Medicine | Admitting: Pharmacist

## 2021-09-09 ENCOUNTER — Other Ambulatory Visit: Payer: Self-pay

## 2021-09-09 DIAGNOSIS — E1165 Type 2 diabetes mellitus with hyperglycemia: Secondary | ICD-10-CM

## 2021-09-09 DIAGNOSIS — Z794 Long term (current) use of insulin: Secondary | ICD-10-CM

## 2021-09-09 MED ORDER — OZEMPIC (0.25 OR 0.5 MG/DOSE) 2 MG/3ML ~~LOC~~ SOPN
PEN_INJECTOR | SUBCUTANEOUS | 2 refills | Status: DC
  Filled 2021-09-09: qty 3, 28d supply, fill #0
  Filled 2021-10-01: qty 3, 28d supply, fill #1
  Filled 2021-10-28: qty 3, 28d supply, fill #2

## 2021-09-09 NOTE — Progress Notes (Signed)
S:    PCP: Dr. Joya Gaskins   No chief complaint on file.  Patient visit virtually in the context of Covid-19 pandemic and d/t lack of clinic visits as our clinic is moving locations this week.   I connected with [name] on 09/09/2021 at 10:00 AM by video and verified that I am speaking with the correct person using two identifiers.   I discussed the limitations, risks, security and privacy concerns of performing an evaluation and management service by video and the availability of in person appointments. I also discussed with the patient that there may be a patient responsible charge related to this service. The patient expressed understanding and agreed to proceed.    Patient location:  home   My Location:  home office    Persons on the call:  myself and the patient      Patient in good spirits.  Presents for diabetes evaluation, education, and management. Patient was last seen by Primary Care Provider 09/01/2021. He presented to clinic 09/04/2021 and asked to be seen d/t hypoglycemia. I could not see him but instructed him to hold his insulin until speaking with me today.  Today, pt reports diabetes is longstanding. He is compliant with weekly Trulicity and Jardiance but is currently holding basal insulin as instructed d/t hypoglycemia. He endorses drastic dietary changes since last PCP clinic visit and this has decreased his need for basal insulin. He denies any further hypoglycemia since holding insulin. Home CBGs are summarized below. Of note, he wonders if we can switch him to Ozempic. He tells me he heard that he can lose more weight with Ozempic.   Family/Social History:  -Current smoker -Denies any alcohol use  Insurance coverage/medication affordability: Medicare/Medicaid  Current diabetes medications include:  -Insulin glargine 36 units QD (no insulin since Friday) -Trulicity 1.5 mg weekly -Jardiance 25 mg QD   Patient denies hypoglycemic events.  Patient reported dietary  habits:  - Endorses drastic change in diet that he thinks is decreasing his need for exogenous insulin.  -Pt reports decreasing the amounts of Little Debbie cakes, Icees, cupcakes, etc. -Denies intake of sugar sweetened beverages   Patient-reported exercise habits: none   Patient denies nocturia (nighttime urination).  Patient denies neuropathy (nerve pain). Patient denies visual changes. Patient reports self foot exams.     O:  Physical Exam  ROS   Lab Results  Component Value Date   HGBA1C 10.0 (A) 06/17/2021   There were no vitals filed for this visit.  Lipid Panel     Component Value Date/Time   CHOL 144 07/20/2019 1116   TRIG 246 (H) 07/20/2019 1116   HDL 32 (L) 07/20/2019 1116   CHOLHDL 4.5 07/20/2019 1116   CHOLHDL 3.3 09/30/2016 0651   VLDL 35 09/30/2016 0651   LDLCALC 72 07/20/2019 1116   Brings his meter with him. Denies any readings >200 or <70. Saturday: AM: 72, post-prandial: 125 Sunday: AM: 88, post-prandial: 104 Monday: AM: 123 post-prandial: 127 Tuesday: AM: 136 post-prandial: 122 Wednesday: 131  Clinical Atherosclerotic Cardiovascular Disease (ASCVD): No  The ASCVD Risk score (Arnett DK, et al., 2019) failed to calculate for the following reasons:   The valid total cholesterol range is 130 to 320 mg/dL    A/P: Diabetes longstanding and currently not at goal. However, home CBGs reveal vast improvement. Patient is able to verbalize appropriate hypoglycemia management plan. Will have him stop Lantus for now. Per his request, we can see how he tolerates Ozempic.  -Stop Lantus. -  Stop Trulicity. Will change to weekly Ozempic, 0.5 mg per patient request. -Continued Jardiance 25 mg daily. -Extensively discussed pathophysiology of diabetes, recommended lifestyle interventions, dietary effects on blood sugar control -Counseled on s/sx of and management of hypoglycemia -Next A1C anticipated 08/2021.  Written patient instructions provided.  Total time  spent in counseling: 20 minutes.   Follow up with PCP.     Benard Halsted, PharmD, [facility] [place], [place] [phone]

## 2021-09-10 ENCOUNTER — Telehealth: Payer: Self-pay | Admitting: Critical Care Medicine

## 2021-09-10 NOTE — Telephone Encounter (Signed)
Medication Refill - Medication:  Dulaglutide (TRULICITY) 1.5 QR/9.7JO SOPN Ibuprofen 800 mg Albuterol aer hfa Depakote 500 mg   Has the patient contacted their pharmacy? Yes.    (Agent: If yes, when and what did the pharmacy advise?)  Preferred Pharmacy (with phone number or street name):   [facility] [phone]  [place]   Has the patient been seen for an appointment in the last year OR does the patient have an upcoming appointment? Yes.    Agent: Please be advised that RX refills may take up to 3 business days. We ask that you follow-up with your pharmacy.                    Dulaglutide (TRULICITY) 1.5 BR/8.3EN SOPN Ibuprofen 800 mg Albuterol aer hfa Depakote 500 mg   [facility] [phone]  [address] [place]

## 2021-09-10 NOTE — Telephone Encounter (Signed)
I am not in favor of this mail order pharmacy use in this mental health patient   so answer is NO

## 2021-09-11 NOTE — Telephone Encounter (Signed)
Contacted mail order pharmacy. They will notify patient of PCP message.

## 2021-09-23 ENCOUNTER — Other Ambulatory Visit: Payer: Self-pay

## 2021-09-29 ENCOUNTER — Encounter: Payer: Self-pay | Admitting: Critical Care Medicine

## 2021-09-29 ENCOUNTER — Ambulatory Visit: Payer: Medicare Other | Attending: Critical Care Medicine | Admitting: Critical Care Medicine

## 2021-09-29 ENCOUNTER — Ambulatory Visit: Payer: Self-pay

## 2021-09-29 ENCOUNTER — Other Ambulatory Visit: Payer: Self-pay

## 2021-09-29 VITALS — BP 168/99 | HR 75 | Wt 222.8 lb

## 2021-09-29 DIAGNOSIS — J449 Chronic obstructive pulmonary disease, unspecified: Secondary | ICD-10-CM

## 2021-09-29 DIAGNOSIS — F17200 Nicotine dependence, unspecified, uncomplicated: Secondary | ICD-10-CM

## 2021-09-29 DIAGNOSIS — F17210 Nicotine dependence, cigarettes, uncomplicated: Secondary | ICD-10-CM

## 2021-09-29 DIAGNOSIS — Z794 Long term (current) use of insulin: Secondary | ICD-10-CM | POA: Diagnosis not present

## 2021-09-29 DIAGNOSIS — F250 Schizoaffective disorder, bipolar type: Secondary | ICD-10-CM

## 2021-09-29 DIAGNOSIS — E1165 Type 2 diabetes mellitus with hyperglycemia: Secondary | ICD-10-CM

## 2021-09-29 LAB — POCT GLYCOSYLATED HEMOGLOBIN (HGB A1C): HbA1c, POC (controlled diabetic range): 7.1 % — AB (ref 0.0–7.0)

## 2021-09-29 LAB — GLUCOSE, POCT (MANUAL RESULT ENTRY): POC Glucose: 120 mg/dl — AB (ref 70–99)

## 2021-09-29 MED ORDER — BLOOD PRESSURE KIT DEVI
0 refills | Status: AC
  Filled 2021-09-29: qty 1, fill #0

## 2021-09-29 MED ORDER — CEFDINIR 300 MG PO CAPS
600.0000 mg | ORAL_CAPSULE | Freq: Two times a day (BID) | ORAL | 0 refills | Status: DC
  Filled 2021-09-29: qty 14, 7d supply, fill #0

## 2021-09-29 MED ORDER — CEFDINIR 300 MG PO CAPS
300.0000 mg | ORAL_CAPSULE | Freq: Two times a day (BID) | ORAL | 0 refills | Status: DC
  Filled 2021-09-29: qty 14, 7d supply, fill #0

## 2021-09-29 MED ORDER — OMEPRAZOLE 20 MG PO CPDR
20.0000 mg | DELAYED_RELEASE_CAPSULE | Freq: Every day | ORAL | 3 refills | Status: DC
  Filled 2021-09-29: qty 30, 30d supply, fill #0
  Filled 2021-10-28: qty 30, 30d supply, fill #1
  Filled 2021-11-25: qty 30, 30d supply, fill #2

## 2021-09-29 MED ORDER — VALSARTAN-HYDROCHLOROTHIAZIDE 320-25 MG PO TABS
1.0000 | ORAL_TABLET | Freq: Every day | ORAL | 3 refills | Status: DC
Start: 2021-09-29 — End: 2021-12-09
  Filled 2021-09-29: qty 90, 90d supply, fill #0

## 2021-09-29 MED ORDER — ACYCLOVIR 5 % EX OINT
1.0000 "application " | TOPICAL_OINTMENT | Freq: Four times a day (QID) | CUTANEOUS | 0 refills | Status: DC
  Filled 2021-09-29: qty 15, 10d supply, fill #0

## 2021-09-29 NOTE — Telephone Encounter (Signed)
  Chief Complaint: congestion Symptoms: cough, congestion, runny nose, wheezing or rattling Frequency: ongoing issue but gotten worse Pertinent Negatives: NA Disposition: '[]'$ ED /'[]'$ Urgent Care (no appt availability in office) / '[x]'$ Appointment(In office/virtual)/ '[]'$  [place] [facility]/ '[]'$ Home Care/ '[]'$ Refused Recommended Disposition /'[]'$ [facility]/ '[]'$  Follow-up with PCP Additional Notes: pt states he has this issue but went to [facility] and got some Vit C and took it and feels like he got sick from that. Unable to lay flat at night d/t feeling like choking d/t increased phlegm. Pt is still smoking as well. Scheduled pt appt for today at 1430 with Dr. Joya Gaskins.  Reason for Disposition  Wheezing is present  Answer Assessment - Initial Assessment Questions 1. ONSET: "When did the cough begin?"      Several years  2. SEVERITY: "How bad is the cough today?"      Moderate  3. SPUTUM: "Describe the color of your sputum" (none, dry cough; clear, white, yellow, green)     Yellow  5. DIFFICULTY BREATHING: "Are you having difficulty breathing?" If Yes, ask: "How bad is it?" (e.g., mild, moderate, severe)    - MILD: No SOB at rest, mild SOB with walking, speaks normally in sentences, can lie down, no retractions, pulse < 100.    - MODERATE: SOB at rest, SOB with minimal exertion and prefers to sit, cannot lie down flat, speaks in phrases, mild retractions, audible wheezing, pulse 100-120.    - SEVERE: Very SOB at rest, speaks in single words, struggling to breathe, sitting hunched forward, retractions, pulse > 120      Moderate  8. LUNG HISTORY: "Do you have any history of lung disease?"  (e.g., pulmonary embolus, asthma, emphysema)     COPD  10. OTHER SYMPTOMS: "Do you have any other symptoms?" (e.g., runny nose, wheezing, chest pain)       Runny nose, wheezing at times  Protocols used: Cough - Acute Productive-A-AH

## 2021-09-29 NOTE — Assessment & Plan Note (Signed)
Not able to engage this patient on smoking cessation at this visit

## 2021-09-29 NOTE — Progress Notes (Signed)
Established Patient Office Visit  Subjective:  Patient ID: [name], male    DOB: 08-Feb-1956  Age: 66 y.o. MRN: [id number]  CC:  Pain in left lower extremity  HPI 04/28/21 [name] presents for primary care follow-up for diabetes and hypertension.  Patient continues to smoke 5 to 6 cigarettes daily at this time.  He has not been compliant with his diet occasionally his fasting sugars are 265-220.  When I review his glucometer his blood sugar ranges anywhere from 1 80-2 10 in the morning fasting.  He self increased his insulin glargine to [name] units a day.  He ran out of refills and cannot get an early refill with this medication.  The patient wishes to hold off on his pneumonia vaccine at this visit.  Patient does have a Cologuard kit at home he states he will have this process soon.  Patient is needing a new nebulizer cup and mouthpiece for his nebulizer machine.  He does maintain the Trulicity weekly along with the insulin glargine and the Jardiance 25 mg daily note he cannot tolerate metformin due to side effects.  Patient has a chronic pain management contract that he has been compliant with and needs refills on tramadol.  Drug database was checked and showed no frequent multiple prescribers no evidence of drug diversion  Patient's mental health status is stable at this time he is on monthly Invega injections at this time  4/17 Patient returns for primary care follow-up with complaints of pain in the right and left lower extremity.   Patient with history of significant delusion.  He thinks he has been bit by [employer].  Patient also is concerned about tick infestation. Patient is followed by [name] and receives [name] injections on a monthly basis.  He would like to receive these here but was explained that he needs to stay with his mental health provider for same.  Patient is having increased wheezing and productive cough of green mucus.  He is still actively  smoking.  09/01/21 Patient is seen as a work in post ER visit.  He was sent by the police upon urging of his spouse [name] to the emergency room at [name] for evaluation by psychiatry.  He had wildly psychotic ideation.  He thought he was [employer], [employer].  He was screaming out at neighbors and bothering him.  Patient was seen by psychiatry no intervention made and was declared to not be a threat to himself or others and was discharged home.  He has since been to [name] and receives his monthly Invega injection although at a lower dose. The patient has been referred to the below mental [name] but the patient's spouse is unclear where to go and needs this information to make the appointment.  The patient is here today by himself. [name] [address], [name], [place] [address] [phone] Your referral was sent here for therapy and psychiatry  Today the patient is fairly cooperative however he is definitely psychotic he thinks he sees ghosts and snakes and sees things moving in the room.  He is cooperative and is not suicidal or homicidal.  Blood pressure initially on arrival was 170/105 but on recheck was 151/96.  He is only on the valsartan 80 mg daily. Patient is still actively smoking  6/6 Patient seen in return follow-up and states he is having increased sore throat increased cough productive of brown mucus shortness of breath as  well.  He is still smoking about a half a pack a day of cigarettes.  He states his blood sugars improved on Ozempic and he is lost some weight.  Blood pressure does remain elevated 168/99.  Patient does maintain valsartan HCT 160.  Blood pressure meter is needed for the patient.  He would also like a refill on Prilosec.  There are no other complaints. 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   Chronic pain    Diabetes mellitus without complication (HCC)    Generalized anxiety disorder    Headache    Hypertension    Schizoaffective disorder ([place])     Past Surgical History:  Procedure Laterality Date   APPENDECTOMY  1970    Family History  Problem Relation Age of Onset   Diabetes Mother    Cancer Mother        unsure what type   Heart disease Mother    Hypertension Mother    Dementia Father    Diabetes Brother    Dementia Maternal Grandfather    Cancer Paternal Grandmother 71       breast   Mental illness Neg Hx     Social History   Socioeconomic History   Marital status: Married    Spouse name: Not on file   Number of children: Not on file   Years of education: Not on file   Highest education level: Not on file  Occupational History   Not on file  Tobacco Use   Smoking status: Every Day    Packs/day: 2.00    Types: Cigarettes   Smokeless tobacco: Never  Vaping Use   Vaping Use: Never used  Substance and Sexual Activity   Alcohol use: Not Currently   Drug use: Yes    Types: Marijuana    Comment: "have changed to hemp now" 09/17/17   Sexual activity: Not on file  Other Topics Concern   Not on file  Social History Narrative   Not on file   Social Determinants of Health   Financial Resource Strain: Not on file  Food Insecurity: Not on file  Transportation Needs: Not on file  Physical Activity: Not on file  Stress: Not on file  Social Connections: Not on file  Intimate Partner Violence: Not on file    Outpatient Medications Prior to Visit  Medication Sig Dispense Refill   Accu-Chek FastClix Lancets MISC Use to check blood sugars three times per day 102 each 11   blood glucose meter kit and supplies KIT Dispense based on patient and insurance preference. Use up to  four times daily as directed. ICD-10 E11.65  Z79.4 1 each 0   Blood Glucose Monitoring Suppl (ACCU-CHEK GUIDE ME) w/Device KIT Use to check blood sugars up to 3 times per day. ICD-10 E11.65, Z79.4 1 kit 0   cetirizine (ZYRTEC) 10 MG tablet Take 1 tablet (10 mg total) by mouth daily. [name] tablet 11   clotrimazole (LOTRIMIN) 1 % cream Apply to affected area 2 times daily 15 g 0   diclofenac (VOLTAREN) 50 MG EC tablet TAKE 1 TABLET BY MOUTH TWICE DAILY AS NEEDED FOR MODERATE PAIN. EAT BEFORE TAKING THIS MEDICATION (Patient taking differently:  Take 50 mg by mouth 2 (two) times daily as needed for mild pain.) 60 tablet 2   gabapentin (NEURONTIN) 300 MG capsule Take 2 capsules (600 mg total) by mouth 2 (two) times daily. 240 capsule 3   glucose blood (ACCU-CHEK GUIDE) test strip Use as instructed to check blood sugars three times per day. ICD-10 E11.65 and Z79.4 100 each 11   hydrOXYzine (ATARAX) 50 MG tablet Take 1 tablet (50 mg total) by mouth 3 (three) times daily. 90 tablet 2   Insulin Pen Needle (TRUEPLUS PEN NEEDLES) 32G X 4 MM MISC Use to inject insulin once a day. 100 each 2   ipratropium-albuterol (DUONEB) 0.5-2.5 (3) MG/3ML SOLN Take 3 mLs by nebulization every 6 (six) hours. (Patient taking differently: Take 3 mLs by nebulization every 6 (six) hours as needed (shortness of breath).) 360 mL 0   JARDIANCE 25 MG TABS tablet Take 1 tablet (25 mg total) by mouth daily. [name] tablet 2   Lancets Misc. (ACCU-CHEK FASTCLIX LANCET) KIT Use to check blood sugars 3 times per day 1 kit 11   Respiratory Therapy Supplies (FLUTTER) DEVI Use with 4 times daily 1 each 0   Semaglutide,0.25 or 0.5MG /DOS, (OZEMPIC, 0.25 OR 0.5 MG/DOSE,) 2 MG/3ML SOPN Inject 0.5 mg subcutaneous once weekly. 3 mL 2   Skin Protectants, Misc. (EUCERIN) cream Apply topically as needed for dry skin. 454 g 0   Spacer/Aero-Holding Chambers (AEROCHAMBER MV) inhaler Use as instructed 1 each 0   SUMAtriptan (IMITREX) 50 MG tablet Take 1 tablet (50  mg total) by mouth every 2 (two) hours as needed for migraine. May repeat in 2 hours if headache persists or recurs. 20 tablet 1   traMADol (ULTRAM) 50 MG tablet Take 1 tablet (50 mg total) by mouth every 6 (six) hours as needed for moderate pain or severe pain. 120 tablet 0   traZODone (DESYREL) 100 MG tablet Take 200 mg by mouth at bedtime as needed for sleep.     VENTOLIN HFA 108 (90 Base) MCG/ACT inhaler Inhale 2 puffs into the lungs every 4 (four) hours as needed for wheezing or shortness of breath. 18 g 3   INVEGA SUSTENNA 156 MG/ML SUSY injection Inject 156 mg into the muscle every [name] (thirty) days.     valsartan-hydrochlorothiazide (DIOVAN-HCT) 160-25 MG tablet Take 1 tablet by mouth daily. 90 tablet 3   divalproex (DEPAKOTE ER) 500 MG 24 hr tablet Take 1,500 mg by mouth at bedtime.     INVEGA SUSTENNA injection Inject 117 mg into the muscle every [name] (thirty) days.     No facility-administered medications prior to visit.    Allergies  Allergen Reactions   Haldol [Haloperidol] Other (See Comments)    Unk reaction   Metformin And Related Diarrhea and Nausea And Vomiting   Trazodone And Nefazodone Itching    ROS Review of Systems  Constitutional: Negative.   HENT: Negative.  Negative for ear pain, postnasal drip, rhinorrhea, sinus pressure, sore throat, trouble swallowing and voice change.   Eyes: Negative.   Respiratory:  Positive for cough, shortness of breath and wheezing. Negative for apnea, choking, chest tightness and stridor.   Cardiovascular: Negative.  Negative for chest pain, palpitations and leg swelling.  Gastrointestinal: Negative.  Negative for abdominal distention, abdominal pain, nausea and vomiting.  Genitourinary: Negative.   Musculoskeletal:  Negative for arthralgias, back pain and myalgias.  Skin: Negative.  Negative for rash.  Allergic/Immunologic: Negative.  Negative for environmental allergies and food allergies.  Neurological:  Negative.  Negative for  dizziness, syncope and weakness.  Hematological: Negative.  Negative for adenopathy. Does not bruise/bleed easily.  Psychiatric/Behavioral:  Negative for agitation, behavioral problems, confusion, decreased concentration, dysphoric mood, hallucinations, self-injury, sleep disturbance and suicidal ideas. The patient is not nervous/anxious and is not hyperactive.      Objective:    Physical Exam Vitals reviewed.  Constitutional:      Appearance: Normal appearance. He is well-developed. He is obese. He is not diaphoretic.  HENT:     Head: Normocephalic and atraumatic.     Nose: Nose normal. No nasal deformity, septal deviation, mucosal edema or rhinorrhea.     Right Sinus: No maxillary sinus tenderness or frontal sinus tenderness.     Left Sinus: No maxillary sinus tenderness or frontal sinus tenderness.     Mouth/Throat:     Mouth: Mucous membranes are moist.     Pharynx: Oropharynx is clear. No oropharyngeal exudate.     Comments: Poor dentition with multiple dental caries Eyes:     General: No scleral icterus.    Conjunctiva/sclera: Conjunctivae normal.     Pupils: Pupils are equal, round, and reactive to light.  Neck:     Thyroid: No thyromegaly.     Vascular: No carotid bruit or JVD.     Trachea: Trachea normal. No tracheal tenderness or tracheal deviation.  Cardiovascular:     Rate and Rhythm: Normal rate and regular rhythm.     Chest Wall: PMI is not displaced.     Pulses: Normal pulses. No decreased pulses.     Heart sounds: Normal heart sounds, S1 normal and S2 normal. Heart sounds not distant. No murmur heard. No systolic murmur is present.  No diastolic murmur is present.    No friction rub. No gallop. No S3 or S4 sounds.  Pulmonary:     Effort: Pulmonary effort is normal. No tachypnea, accessory muscle usage or respiratory distress.     Breath sounds: No stridor. Wheezing and rhonchi present. No decreased breath sounds or rales.  Chest:     Chest wall: No tenderness.   Abdominal:     General: Bowel sounds are normal. There is no distension.     Palpations: Abdomen is soft. Abdomen is not rigid.     Tenderness: There is no abdominal tenderness. There is no guarding or rebound.  Musculoskeletal:        General: Normal range of motion.     Cervical back: Normal range of motion and neck supple. No edema, erythema or rigidity. No muscular tenderness. Normal range of motion.     Comments: Foot exam normal  Lymphadenopathy:     Head:     Right side of head: No submental or submandibular adenopathy.     Left side of head: No submental or submandibular adenopathy.     Cervical: No cervical adenopathy.  Skin:    General: Skin is warm and dry.     Coloration: Skin is not pale.     Findings: No rash.     Nails: There is no clubbing.  Neurological:     Mental Status: He is alert and oriented to person, place, and time.     Sensory: No sensory deficit.  Psychiatric:        Attention and Perception: Attention normal. He perceives auditory and visual hallucinations.        Mood and Affect: Mood is elated. Affect is labile.        Speech: Speech is tangential.  Behavior: Behavior is agitated. Behavior is cooperative.        Thought Content: Thought content is paranoid and delusional. Thought content does not include homicidal or suicidal ideation. Thought content does not include homicidal or suicidal plan.        Cognition and Memory: Cognition is impaired. Memory is impaired.        Judgment: Judgment is impulsive and inappropriate.     Comments: T markedly improved he is much more organized today and is not hallucinating as he was at the last visit    BP (!) 168/99   Pulse 75   Wt 222 lb 12.8 oz (101.1 kg)   SpO2 94%   BMI 32.90 kg/m  Wt Readings from Last 3 Encounters:  09/29/21 222 lb 12.8 oz (101.1 kg)  09/01/21 236 lb 3.2 oz (107.1 kg)  08/10/21 238 lb 9.6 oz (108.2 kg)     Health Maintenance Due  Topic Date Due   Fecal DNA (Cologuard)   Never done   OPHTHALMOLOGY EXAM  09/04/2021    There are no preventive care reminders to display for this patient.  Lab Results  Component Value Date   TSH 1.140 08/28/2018   Lab Results  Component Value Date   WBC 10.0 04/[name]/2023   HGB 15.4 04/[name]/2023   HCT 46.9 04/[name]/2023   MCV 92.3 04/[name]/2023   PLT 216 04/[name]/2023   Lab Results  Component Value Date   NA 136 04/[name]/2023   K 4.3 04/[name]/2023   CO2 24 04/[name]/2023   GLUCOSE 122 (H) 04/[name]/2023   BUN 17 04/[name]/2023   CREATININE 1.23 04/[name]/2023   BILITOT 0.4 04/[name]/2023   ALKPHOS 122 04/[name]/2023   AST 26 04/[name]/2023   ALT 34 04/[name]/2023   PROT 6.7 04/[name]/2023   ALBUMIN 3.8 04/[name]/2023   CALCIUM 9.7 04/[name]/2023   ANIONGAP 11 04/[name]/2023   EGFR 48 (L) 07/09/2021   Lab Results  Component Value Date   CHOL 144 07/20/2019   Lab Results  Component Value Date   HDL 32 (L) 07/20/2019   Lab Results  Component Value Date   LDLCALC 72 07/20/2019   Lab Results  Component Value Date   TRIG 246 (H) 07/20/2019   Lab Results  Component Value Date   CHOLHDL 4.5 07/20/2019   Lab Results  Component Value Date   HGBA1C 7.1 (A) 09/29/2021      Assessment & Plan:   Problem List Items Addressed This Visit       Respiratory   COPD with chronic bronchitis ([place]) - Primary    Acute on chronic bronchitis patient will receive cefdinir for 7 days 300 mg twice daily         Endocrine   Type 2 diabetes mellitus with hyperglycemia (HCC)    Improved glycemic control continue Ozempic       Relevant Medications   valsartan-hydrochlorothiazide (DIOVAN-HCT) 320-25 MG tablet   Other Relevant Orders   POCT glucose (manual entry) (Completed)   POCT glycosylated hemoglobin (Hb A1C) (Completed)     Other   Schizoaffective disorder, bipolar type ([place])    Improved with Invega       Tobacco dependence    Not able to engage this patient on smoking cessation at this visit       Meds ordered this encounter  Medications   acyclovir  ointment (ZOVIRAX) 5 %    Sig: Apply 1 application. topically every 6 (six) hours. Apply to lips/peri oral area    Dispense:  15 g  Refill:  0   DISCONTD: cefdinir (OMNICEF) 300 MG capsule    Sig: Take 1 capsule ($RemoveBe'300mg'[id number]$ ) by mouith 2 (two) times daily for 7 days    Dispense:  14 capsule    Refill:  0   Blood Pressure Monitoring (BLOOD PRESSURE KIT) DEVI    Sig: Use to measure blood pressure    Dispense:  1 each    Refill:  0   omeprazole (PRILOSEC) 20 MG capsule    Sig: Take 1 capsule (20 mg total) by mouth daily.    Dispense:  [name] capsule    Refill:  3   valsartan-hydrochlorothiazide (DIOVAN-HCT) 320-25 MG tablet    Sig: Take 1 tablet by mouth daily.    Dispense:  90 tablet    Refill:  3   cefdinir (OMNICEF) 300 MG capsule    Sig: Take 1 capsule (300 mg total) by mouth 2 (two) times daily.    Dispense:  14 capsule    Refill:  0    Follow-up: Return in about 1 month (around 10/29/2021).    Asencion Noble, MD

## 2021-09-29 NOTE — Assessment & Plan Note (Signed)
Acute on chronic bronchitis patient will receive cefdinir for 7 days 300 mg twice daily

## 2021-09-29 NOTE — Patient Instructions (Addendum)
Start cefdinir 1 tablet twice daily for antibiotic for bronchitis  Start omeprazole 1 pill daily for acid  Use acyclovir ointment every 6 hours to the oral area where you have had the lesions  Increase valsartan HCT to 320 mg/25 1 pill daily this is a increased dose as your blood pressure remains elevated  A blood pressure meter will be issued measured blood pressure twice a day and bring [place] the numbers at the next visit  Return to see Dr. Joya Gaskins in 1 month for blood pressure recheck  Reduce tobacco intake by at least 4 cigarettes a day

## 2021-09-29 NOTE — Assessment & Plan Note (Signed)
Improved with Lorayne Bender

## 2021-09-29 NOTE — Assessment & Plan Note (Signed)
Improved glycemic control continue Ozempic

## 2021-09-30 ENCOUNTER — Other Ambulatory Visit: Payer: Self-pay | Admitting: Critical Care Medicine

## 2021-09-30 ENCOUNTER — Other Ambulatory Visit: Payer: Self-pay

## 2021-09-30 DIAGNOSIS — M545 Low back pain, unspecified: Secondary | ICD-10-CM

## 2021-09-30 DIAGNOSIS — G894 Chronic pain syndrome: Secondary | ICD-10-CM

## 2021-09-30 NOTE — Telephone Encounter (Signed)
Requested medication (s) are due for refill today Due 10/02/21  Requested medication (s) are on the active medication list: yes    Last refill: 09/01/21  #120  0 refills  Future visit scheduled yes 11/02/21  Notes to clinic:Not delegated, please review. Thank you.  Requested Prescriptions  Pending Prescriptions Disp Refills   traMADol (ULTRAM) 50 MG tablet 120 tablet 0    Sig: Take 1 tablet (50 mg total) by mouth every 6 (six) hours as needed for moderate pain or severe pain.     Not Delegated - Analgesics:  Opioid Agonists Failed - 09/30/2021 11:42 AM      Failed - This refill cannot be delegated      Passed - Urine Drug Screen completed in last 360 days      Passed - Valid encounter within last 3 months    Recent Outpatient Visits           Yesterday COPD with chronic bronchitis ([place])   [place] Elsie Stain, MD   3 weeks ago Type 2 diabetes mellitus with hyperglycemia, with long-term current use of insulin [facility])   [place], Annie Main L, RPH-CPP   4 weeks ago Type 2 diabetes mellitus with hyperglycemia, with long-term current use of insulin [facility])   [place], Patrick E, MD   1 month ago Type 2 diabetes mellitus with hyperglycemia, with long-term current use of insulin [facility])   [employer], MD   2 months ago Secondary hypertension   [place] Feliberto Gottron, FNP       Future Appointments             In 1 month Joya Gaskins Burnett Harry, MD [place]

## 2021-09-30 NOTE — Telephone Encounter (Signed)
Medication Refill - Medication:  traMADol (ULTRAM) 50 MG tablet 120 tablet 0 09/01/2021    Sig - Route: Take 1 tablet (50 mg    Pt was in office yesterday and forgot to mention he is almost out of above med, a day or so left.   Has the patient contacted their pharmacy? No. (Agent: If no, request that the patient contact the pharmacy for the refill. If patient does not wish to contact the pharmacy document the reason why and proceed with request.) always calls Dr Viona Gilmore (Agent: If yes, when and what did the pharmacy advise?) Note chg in pharmacy below  Preferred Pharmacy (with phone number or street name):  [facility] [place]. [address] [place] [address]  Phone: [phone] Fax: [phone]   Has the patient been seen for an appointment in the last year OR does the patient have an upcoming appointment? Yes, yesterday  Agent: Please be advised that RX refills may take up to 3 business days. We ask that you follow-up with your pharmacy.

## 2021-10-01 ENCOUNTER — Ambulatory Visit (INDEPENDENT_AMBULATORY_CARE_PROVIDER_SITE_OTHER): Payer: Medicare Other

## 2021-10-01 ENCOUNTER — Other Ambulatory Visit: Payer: Self-pay

## 2021-10-01 DIAGNOSIS — Z0000 Encounter for general adult medical examination without abnormal findings: Secondary | ICD-10-CM

## 2021-10-01 MED ORDER — TRAMADOL HCL 50 MG PO TABS
50.0000 mg | ORAL_TABLET | Freq: Four times a day (QID) | ORAL | 0 refills | Status: DC | PRN
  Filled 2021-10-01: qty 120, 30d supply, fill #0

## 2021-10-01 NOTE — Progress Notes (Addendum)
Subjective:   Joshua [name] is a 66 y.o. male who presents for Medicare Annual/Subsequent preventive examination. I discussed the limitations of evaluation and management by telemedicine and the availability of in person appointments. The patient expressed understanding and agreed to proceed.   Visit performed by audio   Patient location: Home  Provider location: Home  Review of Systems    N/A       Objective:    Today's Vitals   10/01/21 1516  PainSc: 6    There is no height or weight on file to calculate BMI.     10/01/2021    3:56 PM 08/23/2021    3:26 AM 07/13/2021    1:56 PM 11/04/2020    7:45 PM 03/06/2020   10:52 AM 08/04/2019    3:00 AM 08/04/2019    2:14 AM  Advanced Directives  Does Patient Have a Medical Advance Directive? No No No No No Unable to assess, patient is non-responsive or altered mental status No  Would patient like information on creating a medical advance directive?   No - Patient declined No - Patient declined       Current Medications (verified) Outpatient Encounter Medications as of 10/01/2021  Medication Sig   Accu-Chek FastClix Lancets MISC Use to check blood sugars three times per day   acyclovir ointment (ZOVIRAX) 5 % Apply 1 application. topically every 6 (six) hours. Apply to lips/peri oral area   blood glucose meter kit and supplies KIT Dispense based on patient and insurance preference. Use up to four times daily as directed. ICD-10 E11.65  Z79.4   Blood Glucose Monitoring Suppl (ACCU-CHEK GUIDE ME) w/Device KIT Use to check blood sugars up to 3 times per day. ICD-10 E11.65, Z79.4   Blood Pressure Monitoring (BLOOD PRESSURE KIT) DEVI Use to measure blood pressure   cefdinir (OMNICEF) 300 MG capsule Take 1 capsule (300 mg total) by mouth 2 (two) times daily.   cetirizine (ZYRTEC) 10 MG tablet Take 1 tablet (10 mg total) by mouth daily.   clotrimazole (LOTRIMIN) 1 % cream Apply to affected area 2 times daily   diclofenac (VOLTAREN) 50  MG EC tablet TAKE 1 TABLET BY MOUTH TWICE DAILY AS NEEDED FOR MODERATE PAIN. EAT BEFORE TAKING THIS MEDICATION (Patient taking differently: Take 50 mg by mouth 2 (two) times daily as needed for mild pain.)   divalproex (DEPAKOTE ER) 500 MG 24 hr tablet Take 1,500 mg by mouth at bedtime.   gabapentin (NEURONTIN) 300 MG capsule Take 2 capsules (600 mg total) by mouth 2 (two) times daily.   glucose blood (ACCU-CHEK GUIDE) test strip Use as instructed to check blood sugars three times per day. ICD-10 E11.65 and Z79.4   hydrOXYzine (ATARAX) 50 MG tablet Take 1 tablet (50 mg total) by mouth 3 (three) times daily.   INVEGA SUSTENNA injection Inject 117 mg into the muscle every 30 (thirty) days.   ipratropium-albuterol (DUONEB) 0.5-2.5 (3) MG/3ML SOLN Take 3 mLs by nebulization every 6 (six) hours. (Patient taking differently: Take 3 mLs by nebulization every 6 (six) hours as needed (shortness of breath).)   JARDIANCE 25 MG TABS tablet Take 1 tablet (25 mg total) by mouth daily.   Lancets Misc. (ACCU-CHEK FASTCLIX LANCET) KIT Use to check blood sugars 3 times per day   omeprazole (PRILOSEC) 20 MG capsule Take 1 capsule (20 mg total) by mouth daily.   Respiratory Therapy Supplies (FLUTTER) DEVI Use with 4 times daily   Skin Protectants, Misc. (EUCERIN) cream  Apply topically as needed for dry skin.   Spacer/Aero-Holding Chambers (AEROCHAMBER MV) inhaler Use as instructed   SUMAtriptan (IMITREX) 50 MG tablet Take 1 tablet (50 mg total) by mouth every 2 (two) hours as needed for migraine. May repeat in 2 hours if headache persists or recurs.   traMADol (ULTRAM) 50 MG tablet Take 1 tablet (50 mg total) by mouth every 6 (six) hours as needed for moderate pain or severe pain.   valsartan-hydrochlorothiazide (DIOVAN-HCT) 320-25 MG tablet Take 1 tablet by mouth daily.   VENTOLIN HFA 108 (90 Base) MCG/ACT inhaler Inhale 2 puffs into the lungs every 4 (four) hours as needed for wheezing or shortness of breath.    [DISCONTINUED] Insulin Pen Needle (TRUEPLUS PEN NEEDLES) 32G X 4 MM MISC Use to inject insulin once a day. (Patient not taking: Reported on 10/01/2021)   [DISCONTINUED] Semaglutide,0.25 or 0.5MG /DOS, (OZEMPIC, 0.25 OR 0.5 MG/DOSE,) 2 MG/3ML SOPN Inject 0.5 mg subcutaneous once weekly. (Patient not taking: Reported on 10/01/2021)   [DISCONTINUED] traZODone (DESYREL) 100 MG tablet Take 200 mg by mouth at bedtime as needed for sleep. (Patient not taking: Reported on 10/01/2021)   No facility-administered encounter medications on file as of 10/01/2021.    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   Chronic pain    Diabetes mellitus without complication (HCC)    Generalized anxiety disorder    Headache    Hypertension    Schizoaffective disorder ([place])    Past Surgical History:  Procedure Laterality Date   APPENDECTOMY  1970   Family History  Problem Relation Age of Onset   Diabetes Mother    Cancer Mother        unsure what type   Heart disease Mother    Hypertension Mother    Dementia Father    Diabetes Brother    Dementia Maternal Grandfather    Cancer Paternal Grandmother 71       breast   Mental illness Neg Hx    Social History   Socioeconomic History   Marital status: Married    Spouse name: Not on file   Number of children: Not on file   Years of education: Not on file   Highest education level: Not on file  Occupational History   Not on file  Tobacco Use   Smoking status: Every Day    Packs/day: 2.00    Years: 50.00    Total pack years: 100.00    Types: Cigarettes   Smokeless tobacco: Never  Vaping Use   Vaping Use: Never used  Substance and Sexual  Activity   Alcohol use: Not Currently   Drug use: Yes    Types: Marijuana    Comment: "have changed to hemp now" 09/17/17   Sexual activity: Yes  Other Topics Concern   Not on file  Social History Narrative   Not on file   Social Determinants of Health   Financial Resource Strain: Low Risk  (10/01/2021)   Overall Financial Resource Strain (CARDIA)    Difficulty of Paying Living Expenses: Not hard at all  Food Insecurity: No Food Insecurity (10/01/2021)   Hunger Vital Sign    Worried About Running Out of Food in the Last  Year: Never true    [place] in the Last Year: Never true  Transportation Needs: No Transportation Needs (10/01/2021)   PRAPARE - Hydrologist (Medical): No    Lack of Transportation (Non-Medical): No  Physical Activity: Inactive (10/01/2021)   Exercise Vital Sign    Days of Exercise per Week: 0 days    Minutes of Exercise per Session: 0 min  Stress: No Stress Concern Present (10/01/2021)   [place]    Feeling of Stress : Not at all  Social Connections: Not on file    Tobacco Counseling Ready to quit: Not Answered Counseling given: Not Answered   Clinical Intake:  Pre-visit preparation completed: Yes  Pain : 0-10 Pain Score: 6  Pain Type: Chronic pain Pain Location: Back Pain Orientation: Lower, Mid Pain Radiating Towards: Legs and feet Pain Descriptors / Indicators: Radiating, Sharp, Aching, Throbbing Pain Onset: More than a month ago Pain Frequency: Intermittent Pain Relieving Factors: Voltren  Pain Relieving Factors: Voltren  Diabetes: Yes  How often do you need to have someone help you when you read instructions, pamphlets, or other written materials from your doctor or pharmacy?: 3 - Sometimes (Wife does assistance with medicine)  Diabetic?Yes  Interpreter Needed?: No  Information entered by :: [place]   Activities of Daily  Living    10/01/2021    3:49 PM 10/01/2021    3:43 PM  In your present state of health, do you have any difficulty performing the following activities:  Hearing? 0   Vision? 1   Difficulty concentrating or making decisions? 0   Walking or climbing stairs? 1 1  Dressing or bathing? 0   Doing errands, shopping? 0   Preparing Food and eating ? N N  Using the Toilet? N   Managing your Medications? N N  Managing your Finances? N N  Housekeeping or managing your Housekeeping? N N    Patient Care Team: Elsie Stain, MD as PCP - General (Pulmonary Disease)  Indicate any recent Medical Services you may have received from other than [facility] providers in the past year (date may be approximate).     Assessment:   This is a routine wellness examination for Joshua [name].  Hearing/Vision screen No results found.  Dietary issues and exercise activities discussed:     Goals Addressed   None    Depression Screen    10/01/2021    3:32 PM 09/29/2021    2:57 PM 11/05/2020    1:18 AM 06/02/2020    2:32 PM 03/06/2020   10:51 AM 01/28/2020    4:18 PM 09/18/2019   10:35 AM  PHQ 2/9 Scores  PHQ - 2 Score 0 0 0 3 0 3 2  PHQ- 9 Score   '5 11 1 12 5  '$ Exception Documentation   Other- indicate reason in comment box        Fall Risk    10/01/2021    3:42 PM 09/29/2021    2:57 PM 09/01/2021   10:40 AM 07/09/2021    3:35 PM 12/23/2020   10:39 AM  Greasy in the past year? 0 0 0 0 0  Number falls in past yr: 0 0 0 0 0  Injury with Fall? 0 0 0 0 0  Risk for fall due to : No Fall Risks No Fall Risks No Fall Risks    Follow up Falls evaluation  completed        FALL RISK PREVENTION PERTAINING TO THE HOME:  Any stairs in or around the home? Yes  If so, are there any without handrails? No  Home free of loose throw rugs in walkways, pet beds, electrical cords, etc? Yes  Adequate lighting in your home to reduce risk of falls? Yes   ASSISTIVE DEVICES UTILIZED TO PREVENT FALLS:  Life alert? No  Use  of a cane, walker or w/c? Yes  Grab bars in the bathroom? No  Shower chair or bench in shower? Yes  Elevated toilet seat or a handicapped toilet? No   TIMED UP AND GO:  Was the test performed? No .  Length of time to ambulate 10 feet: 0 sec.     Cognitive Function:        10/01/2021    3:56 PM  6CIT Screen  What Year? 0 points  What month? 0 points  What time? 0 points  Count back from 20 0 points  Months in reverse 0 points  Repeat phrase 0 points  Total Score 0 points    Immunizations Immunization History  Administered Date(s) Administered   Influenza,inj,Quad PF,6+ Mos 02/14/2017, 02/23/2021   Moderna SARS-COV2 Booster Vaccination 06/18/2020   Moderna Sars-Covid-2 Vaccination 11/21/2019, 12/19/2019   PNEUMOCOCCAL CONJUGATE-20 04/28/2021   Pneumococcal Polysaccharide-23 07/15/2020   Tdap 10/14/2017    TDAP status: Up to date  Flu Vaccine status: Up to date  Pneumococcal vaccine status: Up to date  Covid-19 vaccine status: Information provided on how to obtain vaccines.   Qualifies for Shingles Vaccine? Yes   Zostavax completed Yes   Shingrix Completed?: Yes  Screening Tests Health Maintenance  Topic Date Due   Fecal DNA (Cologuard)  Never done   OPHTHALMOLOGY EXAM  09/04/2021   HEMOGLOBIN A1C  03/31/2022   FOOT EXAM  09/30/2022   TETANUS/TDAP  10/15/2027   HPV VACCINES  Aged Out   INFLUENZA VACCINE  Discontinued   COVID-19 Vaccine  Discontinued   Hepatitis C Screening  Discontinued   HIV Screening  Discontinued   Zoster Vaccines- Shingrix  Discontinued    Health Maintenance  Health Maintenance Due  Topic Date Due   Fecal DNA (Cologuard)  Never done   OPHTHALMOLOGY EXAM  09/04/2021    Colorectal cancer screening: Type of screening: Colonoscopy. Completed No. Repeat every 10 years  Lung Cancer Screening: (Low Dose CT Chest recommended if Age 8-80 years, 30 pack-year currently smoking OR have quit w/in 15years.) does qualify.   Lung Cancer  Screening Referral: No  Additional Screening:  Hepatitis C Screening: does not qualify; Completed No  Vision Screening: Recommended annual ophthalmology exams for early detection of glaucoma and other disorders of the eye. Is the patient up to date with their annual eye exam?  No  Who is the provider or what is the name of the office in which the patient attends annual eye exams? N/A If pt is not established with a provider, would they like to be referred to a provider to establish care? No .   Dental Screening: Recommended annual dental exams for proper oral hygiene  Community Resource Referral / Chronic Care Management: CRR required this visit?  No   CCM required this visit?  No      Plan:     I have personally reviewed and noted the following in the patient's chart:   Medical and social history Use of alcohol, tobacco or illicit drugs  Current medications and supplements including  opioid prescriptions. Patient is currently taking opioid prescriptions. Information provided to patient regarding non-opioid alternatives. Patient advised to discuss non-opioid treatment plan with their provider. Functional ability and status Nutritional status Physical activity Advanced directives List of other physicians Hospitalizations, surgeries, and ER visits in previous 12 months Vitals Screenings to include cognitive, depression, and falls Referrals and appointments  In addition, I have reviewed and discussed with patient certain preventive protocols, quality metrics, and best practice recommendations. A written personalized care plan for preventive services as well as general preventive health recommendations were provided to patient.     Mr. Gibbon , Thank you for taking time to come for your Medicare Wellness Visit. I appreciate your ongoing commitment to your health goals. Please review the following plan we discussed and let me know if I can assist you in the future.   These are  the goals we discussed:  Goals   None     This is a list of the screening recommended for you and due dates:  Health Maintenance  Topic Date Due   Cologuard (Stool DNA test)  Never done   Eye exam for diabetics  09/04/2021   Hemoglobin A1C  03/31/2022   Complete foot exam   09/30/2022   Tetanus Vaccine  10/15/2027   HPV Vaccine  Aged Out   Flu Shot  Discontinued   COVID-19 Vaccine  Discontinued   Hepatitis C Screening: USPSTF Recommendation to screen - Ages 18-79 yo.  Discontinued   HIV Screening  Discontinued   Zoster (Shingles) Vaccine  Discontinued    Renato Gails, [facility]   10/01/2021   Nurse Notes: Patient is requesting a wheelchair and grab bars for the bathroom.   I have reviewed and agreed to the above documentation.   Theresia Lo, FNP

## 2021-10-09 ENCOUNTER — Ambulatory Visit: Payer: Medicare Other | Admitting: Pharmacist

## 2021-10-28 ENCOUNTER — Other Ambulatory Visit: Payer: Self-pay | Admitting: Critical Care Medicine

## 2021-10-28 ENCOUNTER — Other Ambulatory Visit: Payer: Self-pay

## 2021-10-28 DIAGNOSIS — G894 Chronic pain syndrome: Secondary | ICD-10-CM

## 2021-10-28 DIAGNOSIS — M545 Low back pain, unspecified: Secondary | ICD-10-CM

## 2021-10-28 MED ORDER — TRAMADOL HCL 50 MG PO TABS
50.0000 mg | ORAL_TABLET | Freq: Four times a day (QID) | ORAL | 0 refills | Status: DC | PRN
  Filled 2021-10-28: qty 120, 30d supply, fill #0

## 2021-10-28 NOTE — Telephone Encounter (Signed)
Requested medication (s) are due for refill today - yes  Requested medication (s) are on the active medication list -yes  Future visit scheduled -yes  Last refill: 10/01/21 #120  Notes to clinic: non delegated Rx  Requested Prescriptions  Pending Prescriptions Disp Refills   traMADol (ULTRAM) 50 MG tablet 120 tablet 0    Sig: Take 1 tablet (50 mg total) by mouth every 6 (six) hours as needed for moderate pain or severe pain.     Not Delegated - Analgesics:  Opioid Agonists Failed - 10/28/2021  9:26 AM      Failed - This refill cannot be delegated      Passed - Urine Drug Screen completed in last 360 days      Passed - Valid encounter within last 3 months    Recent Outpatient Visits           4 weeks ago COPD with chronic bronchitis ([place])   [place] Elsie Stain, MD   1 month ago Type 2 diabetes mellitus with hyperglycemia, with long-term current use of insulin [facility])   [place], Annie Main L, RPH-CPP   1 month ago Type 2 diabetes mellitus with hyperglycemia, with long-term current use of insulin [facility])   [place] Elsie Stain, MD   2 months ago Type 2 diabetes mellitus with hyperglycemia, with long-term current use of insulin [facility])   [place], Patrick E, MD   3 months ago Secondary hypertension   [place], Broadus John, FNP       Future Appointments             In 5 days Elsie Stain, MD [place]               Requested Prescriptions  Pending Prescriptions Disp Refills   traMADol (ULTRAM) 50 MG tablet 120 tablet 0    Sig: Take 1 tablet (50 mg total) by mouth every 6 (six) hours as needed for moderate pain or severe pain.     Not Delegated - Analgesics:  Opioid Agonists Failed - 10/28/2021  9:26 AM      Failed - This refill cannot be  delegated      Passed - Urine Drug Screen completed in last 360 days      Passed - Valid encounter within last 3 months    Recent Outpatient Visits           4 weeks ago COPD with chronic bronchitis ([place])   [place] Elsie Stain, MD   1 month ago Type 2 diabetes mellitus with hyperglycemia, with long-term current use of insulin [facility])   [place], Annie Main L, RPH-CPP   1 month ago Type 2 diabetes mellitus with hyperglycemia, with long-term current use of insulin [facility])   [place] Elsie Stain, MD   2 months ago Type 2 diabetes mellitus with hyperglycemia, with long-term current use of insulin [facility])   [place], MD   3 months ago Secondary hypertension   [place] Feliberto Gottron, FNP       Future Appointments             In 5 days Elsie Stain, MD  [facility]

## 2021-10-29 ENCOUNTER — Other Ambulatory Visit: Payer: Self-pay

## 2021-11-02 ENCOUNTER — Encounter: Payer: Self-pay | Admitting: Critical Care Medicine

## 2021-11-02 ENCOUNTER — Ambulatory Visit: Payer: Medicare Other | Attending: Critical Care Medicine | Admitting: Critical Care Medicine

## 2021-11-02 VITALS — BP 152/84 | HR 74 | Wt 222.8 lb

## 2021-11-02 DIAGNOSIS — E1165 Type 2 diabetes mellitus with hyperglycemia: Secondary | ICD-10-CM | POA: Diagnosis not present

## 2021-11-02 DIAGNOSIS — J449 Chronic obstructive pulmonary disease, unspecified: Secondary | ICD-10-CM

## 2021-11-02 DIAGNOSIS — G8929 Other chronic pain: Secondary | ICD-10-CM

## 2021-11-02 DIAGNOSIS — F17210 Nicotine dependence, cigarettes, uncomplicated: Secondary | ICD-10-CM

## 2021-11-02 DIAGNOSIS — M5442 Lumbago with sciatica, left side: Secondary | ICD-10-CM

## 2021-11-02 DIAGNOSIS — F17200 Nicotine dependence, unspecified, uncomplicated: Secondary | ICD-10-CM

## 2021-11-02 DIAGNOSIS — I10 Essential (primary) hypertension: Secondary | ICD-10-CM | POA: Diagnosis not present

## 2021-11-02 DIAGNOSIS — Z794 Long term (current) use of insulin: Secondary | ICD-10-CM

## 2021-11-02 MED ORDER — FLUTICASONE-SALMETEROL 250-50 MCG/ACT IN AEPB: 1.0000 | INHALATION_SPRAY | Freq: Two times a day (BID) | RESPIRATORY_TRACT | 6 refills | Status: DC

## 2021-11-02 MED ORDER — PROMETHAZINE-DM 6.25-15 MG/5ML PO SYRP: 5.0000 mL | ORAL_SOLUTION | Freq: Four times a day (QID) | ORAL | 0 refills | Status: DC | PRN

## 2021-11-02 MED ORDER — AMLODIPINE BESYLATE 5 MG PO TABS: 5.0000 mg | ORAL_TABLET | Freq: Every day | ORAL | 2 refills | Status: DC

## 2021-11-02 MED ORDER — LEVOFLOXACIN 500 MG PO TABS: 500.0000 mg | ORAL_TABLET | Freq: Every day | ORAL | 0 refills | Status: DC

## 2021-11-02 NOTE — Assessment & Plan Note (Signed)
  .   Current smoking consumption amount: 10 cigarettes a day  . Dicsussion on advise to quit smoking and smoking impacts: Cardiovascular lung impacts  . Patient's willingness to quit: Not ready to quit  . Methods to quit smoking discussed: Behavioral modification  . Medication management of smoking session drugs discussed: Not indicated  . Resources provided:  AVS   . Setting quit date not established  Follow-up arranged 1 month  Time spent counseling the patient: 5 minutes

## 2021-11-02 NOTE — Assessment & Plan Note (Signed)
We will begin Levaquin 500 mg daily for 5 days and give cough syrup

## 2021-11-02 NOTE — Assessment & Plan Note (Signed)
No change in medications

## 2021-11-02 NOTE — Assessment & Plan Note (Signed)
Blood pressure not at goal plan is to add amlodipine 5 mg daily and continue valsartan HCT daily

## 2021-11-02 NOTE — Progress Notes (Signed)
Established Patient Office Visit  Subjective:  Patient ID: Joshua [name], male    DOB: 07-06-1955  Age: 66 y.o. MRN: [id number]  CC:  Cough  HPI 04/28/21 [place] presents for primary care follow-up for diabetes and hypertension.  Patient continues to smoke 5 to 6 cigarettes daily at this time.  He has not been compliant with his diet occasionally his fasting sugars are 265-220.  When I review his glucometer his blood sugar ranges anywhere from 1 80-2 10 in the morning fasting.  He self increased his insulin glargine to 30 units a day.  He ran out of refills and cannot get an early refill with this medication.  The patient wishes to hold off on his pneumonia vaccine at this visit.  Patient does have a Cologuard kit at home he states he will have this process soon.  Patient is needing a new nebulizer cup and mouthpiece for his nebulizer machine.  He does maintain the Trulicity weekly along with the insulin glargine and the Jardiance 25 mg daily note he cannot tolerate metformin due to side effects.  Patient has a chronic pain management contract that he has been compliant with and needs refills on tramadol.  Drug database was checked and showed no frequent multiple prescribers no evidence of drug diversion  Patient's mental health status is stable at this time he is on monthly Invega injections at this time  4/17 Patient returns for primary care follow-up with complaints of pain in the right and left lower extremity.   Patient with history of significant delusion.  He thinks he has been bit by [employer].  Patient also is concerned about tick infestation. Patient is followed by [facility] and receives [place] injections on a monthly basis.  He would like to receive these here but was explained that he needs to stay with his mental health provider for same.  Patient is having increased wheezing and productive cough of green mucus.  He is still actively smoking.  09/01/21 Patient is  seen as a work in post ER visit.  He was sent by the police upon urging of his spouse Joshua [name] 32 to [facility] by [facility]ry.  He had wildly psychotic ideation.  He thought he was [employer], [employer].  He was screaming out at neighbors and bothering him.  Patient was seen by psychiatry no intervention made and was declared to not be a threat to himself or others and was discharged home.  He has since been to [facility] and receives his monthly Invega injection although at a lower dose. The patient has been referred to the below mental [place] but the patient's spouse is unclear where to go and needs this information to make the appointment.  The patient is here today by himself. [address], [place], [place] [address] [phone] Your referral was sent here for therapy and psychiatry  Today the patient is fairly cooperative however he is definitely psychotic he thinks he sees ghosts and snakes and sees things moving in the room.  He is cooperative and is not suicidal or homicidal.  Blood pressure initially on arrival was 170/105 but on recheck was 151/96.  He is only on the valsartan 80 mg daily. Patient is still actively smoking  6/6 Patient seen in return follow-up and states he is having increased sore throat increased cough productive of brown mucus shortness of breath as well.  He is  still smoking about a half a pack a day of cigarettes.  He states his blood sugars improved on Ozempic and he is lost some weight.  Blood pressure does remain elevated 168/99.  Patient does maintain valsartan HCT 160.  Blood pressure meter is needed for the patient.  He would also like a refill on Prilosec.  There are no other complaints. The patient elected to return to [facility] for his Invega injections and is improved mentally  7/10 Patient returns with increased cough productive of white to yellow-green mucus he took a course of  cefdinir with some improvement now has worsened on arrival blood pressure elevated 151/94 on repeat is 148/92 patient does continue with his Invega injections monthly at [facility] patient notes some wheezing  Past Medical History:  Diagnosis Date   Acute recurrent maxillary sinusitis 01/19/2021   Back pain    Bipolar disorder (HCC)    Cannabis use disorder, moderate, dependence (HCC)    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   Chronic pain    Diabetes mellitus without complication (HCC)    Generalized anxiety disorder    Headache    Hypertension    Schizoaffective disorder ([place])     Past Surgical History:  Procedure Laterality Date   APPENDECTOMY  1970    Family History  Problem Relation Age of Onset   Diabetes Mother    Cancer Mother        unsure what type   Heart disease Mother    Hypertension Mother    Dementia Father    Diabetes Brother    Dementia Maternal Grandfather    Cancer Paternal Grandmother 71       breast   Mental illness Neg Hx     Social History   Socioeconomic History   Marital status: Married    Spouse name: Not on file   Number of children: Not on file   Years of education: Not on file   Highest education level: Not on file  Occupational History   Not on file  Tobacco Use   Smoking status: Every Day    Packs/day: 2.00    Years: 50.00    Total pack years: 100.00    Types: Cigarettes   Smokeless tobacco: Never  Vaping Use   Vaping Use: Never used  Substance and Sexual Activity   Alcohol use: Not Currently   Drug use: Yes    Types: Marijuana    Comment: "have changed to hemp now" 09/17/17   Sexual activity: Yes  Other Topics Concern   Not on file  Social History Narrative   Not on file   Social Determinants of Health   Financial Resource Strain: Low Risk  (10/01/2021)   Overall Financial Resource Strain (CARDIA)    Difficulty of Paying Living Expenses: Not hard at all  Food Insecurity: No Food  Insecurity (10/01/2021)   Hunger Vital Sign    Worried About Running Out of Food in the Last Year: Never true    Ran Out of Food in the Last Year: Never true  Transportation Needs: No Transportation Needs (10/01/2021)   PRAPARE - Hydrologist (Medical): No    Lack of Transportation (Non-Medical): No  Physical Activity: Inactive (10/01/2021)   Exercise Vital Sign    Days of Exercise per Week: 0 days    Minutes of Exercise per Session: 0 min  Stress: No Stress Concern Present (10/01/2021)   [place]  Questionnaire    Feeling of Stress : Not at all  Social Connections: Not on file  Intimate Partner Violence: Not on file    Outpatient Medications Prior to Visit  Medication Sig Dispense Refill   Accu-Chek FastClix Lancets MISC Use to check blood sugars three times per day 102 each 11   acyclovir ointment (ZOVIRAX) 5 % Apply 1 application. topically every 6 (six) hours. Apply to lips/peri oral area 15 g 0   blood glucose meter kit and supplies KIT Dispense based on patient and insurance preference. Use up to four times daily as directed. ICD-10 E11.65  Z79.4 1 each 0   Blood Glucose Monitoring Suppl (ACCU-CHEK GUIDE ME) w/Device KIT Use to check blood sugars up to 3 times per day. ICD-10 E11.65, Z79.4 1 kit 0   Blood Pressure Monitoring (BLOOD PRESSURE KIT) DEVI Use to measure blood pressure 1 each 0   cetirizine (ZYRTEC) 10 MG tablet Take 1 tablet (10 mg total) by mouth daily. 30 tablet 11   clotrimazole (LOTRIMIN) 1 % cream Apply to affected area 2 times daily 15 g 0   diclofenac (VOLTAREN) 50 MG EC tablet TAKE 1 TABLET BY MOUTH TWICE DAILY AS NEEDED FOR MODERATE PAIN. EAT BEFORE TAKING THIS MEDICATION (Patient taking differently: Take 50 mg by mouth 2 (two) times daily as needed for mild pain.) 60 tablet 2   divalproex (DEPAKOTE ER) 500 MG 24 hr tablet Take 1,500 mg by mouth at bedtime.     gabapentin (NEURONTIN) 300  MG capsule Take 2 capsules (600 mg total) by mouth 2 (two) times daily. 240 capsule 3   glucose blood (ACCU-CHEK GUIDE) test strip Use as instructed to check blood sugars three times per day. ICD-10 E11.65 and Z79.4 100 each 11   hydrOXYzine (ATARAX) 50 MG tablet Take 1 tablet (50 mg total) by mouth 3 (three) times daily. 90 tablet 2   ipratropium-albuterol (DUONEB) 0.5-2.5 (3) MG/3ML SOLN Take 3 mLs by nebulization every 6 (six) hours. (Patient taking differently: Take 3 mLs by nebulization every 6 (six) hours as needed (shortness of breath).) 360 mL 0   JARDIANCE 25 MG TABS tablet Take 1 tablet (25 mg total) by mouth daily. 30 tablet 2   Lancets Misc. (ACCU-CHEK FASTCLIX LANCET) KIT Use to check blood sugars 3 times per day 1 kit 11   omeprazole (PRILOSEC) 20 MG capsule Take 1 capsule (20 mg total) by mouth daily. 30 capsule 3   paliperidone (INVEGA SUSTENNA) 156 MG/ML SUSY injection      Respiratory Therapy Supplies (FLUTTER) DEVI Use with 4 times daily 1 each 0   Semaglutide,0.25 or 0.5MG /DOS, (OZEMPIC, 0.25 OR 0.5 MG/DOSE,) 2 MG/3ML SOPN Inject 0.5 mg subcutaneous once weekly. 3 mL 2   Skin Protectants, Misc. (EUCERIN) cream Apply topically as needed for dry skin. 454 g 0   Spacer/Aero-Holding Chambers (AEROCHAMBER MV) inhaler Use as instructed 1 each 0   SUMAtriptan (IMITREX) 50 MG tablet Take 1 tablet (50 mg total) by mouth every 2 (two) hours as needed for migraine. May repeat in 2 hours if headache persists or recurs. 20 tablet 1   traMADol (ULTRAM) 50 MG tablet Take 1 tablet (50 mg total) by mouth every 6 (six) hours as needed for moderate pain or severe pain. 120 tablet 0   valsartan-hydrochlorothiazide (DIOVAN-HCT) 320-25 MG tablet Take 1 tablet by mouth daily. 90 tablet 3   VENTOLIN HFA 108 (90 Base) MCG/ACT inhaler Inhale 2 puffs into the lungs every 4 (four) hours  as needed for wheezing or shortness of breath. 18 g 3   cefdinir (OMNICEF) 300 MG capsule Take 1 capsule (300 mg total) by  mouth 2 (two) times daily. 14 capsule 0   INVEGA SUSTENNA injection Inject 117 mg into the muscle every 30 (thirty) days.     No facility-administered medications prior to visit.    Allergies  Allergen Reactions   Haldol [Haloperidol] Other (See Comments)    Unk reaction   Metformin And Related Diarrhea and Nausea And Vomiting   Trazodone And Nefazodone Itching    ROS Review of Systems  Constitutional: Negative.   HENT:  Positive for sore throat. Negative for ear pain, postnasal drip, rhinorrhea, sinus pressure, trouble swallowing and voice change.   Eyes: Negative.   Respiratory:  Positive for cough, shortness of breath and wheezing. Negative for apnea, choking, chest tightness and stridor.   Cardiovascular: Negative.  Negative for chest pain, palpitations and leg swelling.  Gastrointestinal: Negative.  Negative for abdominal distention, abdominal pain, nausea and vomiting.  Genitourinary: Negative.   Musculoskeletal:  Positive for back pain. Negative for arthralgias and myalgias.  Skin: Negative.  Negative for rash.  Allergic/Immunologic: Negative.  Negative for environmental allergies and food allergies.  Neurological: Negative.  Negative for dizziness, syncope and weakness.  Hematological: Negative.  Negative for adenopathy. Does not bruise/bleed easily.  Psychiatric/Behavioral:  Negative for agitation, behavioral problems, confusion, decreased concentration, dysphoric mood, hallucinations, self-injury, sleep disturbance and suicidal ideas. The patient is not nervous/anxious and is not hyperactive.       Objective:    Physical Exam Vitals reviewed.  Constitutional:      Appearance: Normal appearance. He is well-developed. He is obese. He is not diaphoretic.  HENT:     Head: Normocephalic and atraumatic.     Nose: Nose normal. No nasal deformity, septal deviation, mucosal edema or rhinorrhea.     Right Sinus: No maxillary sinus tenderness or frontal sinus tenderness.      Left Sinus: No maxillary sinus tenderness or frontal sinus tenderness.     Mouth/Throat:     Mouth: Mucous membranes are moist.     Pharynx: Oropharynx is clear. No oropharyngeal exudate.     Comments: Poor dentition with multiple dental caries Eyes:     General: No scleral icterus.    Conjunctiva/sclera: Conjunctivae normal.     Pupils: Pupils are equal, round, and reactive to light.  Neck:     Thyroid: No thyromegaly.     Vascular: No carotid bruit or JVD.     Trachea: Trachea normal. No tracheal tenderness or tracheal deviation.  Cardiovascular:     Rate and Rhythm: Normal rate and regular rhythm.     Chest Wall: PMI is not displaced.     Pulses: Normal pulses. No decreased pulses.     Heart sounds: Normal heart sounds, S1 normal and S2 normal. Heart sounds not distant. No murmur heard.    No systolic murmur is present.     No diastolic murmur is present.     No friction rub. No gallop. No S3 or S4 sounds.  Pulmonary:     Effort: Pulmonary effort is normal. No tachypnea, accessory muscle usage or respiratory distress.     Breath sounds: No stridor. Wheezing present. No decreased breath sounds, rhonchi or rales.  Chest:     Chest wall: No tenderness.  Abdominal:     General: Bowel sounds are normal. There is no distension.     Palpations: Abdomen is soft.  Abdomen is not rigid.     Tenderness: There is no abdominal tenderness. There is no guarding or rebound.  Musculoskeletal:        General: Normal range of motion.     Cervical back: Normal range of motion and neck supple. No edema, erythema or rigidity. No muscular tenderness. Normal range of motion.  Lymphadenopathy:     Head:     Right side of head: No submental or submandibular adenopathy.     Left side of head: No submental or submandibular adenopathy.     Cervical: No cervical adenopathy.  Skin:    General: Skin is warm and dry.     Coloration: Skin is not pale.     Findings: No rash.     Nails: There is no clubbing.   Neurological:     Mental Status: He is alert and oriented to person, place, and time.     Sensory: No sensory deficit.  Psychiatric:        Attention and Perception: Attention normal. He does not perceive auditory or visual hallucinations.        Mood and Affect: Affect is not labile.        Speech: Speech is not tangential.        Behavior: Behavior is not agitated. Behavior is cooperative.        Thought Content: Thought content is not paranoid or delusional. Thought content does not include homicidal or suicidal ideation. Thought content does not include homicidal or suicidal plan.        Cognition and Memory: Cognition is not impaired. Memory is not impaired.        Judgment: Judgment is not impulsive or inappropriate.     BP (!) 152/84   Pulse 74   Wt 222 lb 12.8 oz (101.1 kg)   SpO2 93%   BMI 32.90 kg/m  Wt Readings from Last 3 Encounters:  11/02/21 222 lb 12.8 oz (101.1 kg)  09/29/21 222 lb 12.8 oz (101.1 kg)  09/01/21 236 lb 3.2 oz (107.1 kg)     Health Maintenance Due  Topic Date Due   Fecal DNA (Cologuard)  Never done   OPHTHALMOLOGY EXAM  09/04/2021    There are no preventive care reminders to display for this patient.  Lab Results  Component Value Date   TSH 1.140 08/28/2018   Lab Results  Component Value Date   WBC 10.0 08/23/2021   HGB 15.4 08/23/2021   HCT 46.9 08/23/2021   MCV 92.3 08/23/2021   PLT 216 08/23/2021   Lab Results  Component Value Date   NA 136 08/23/2021   K 4.3 08/23/2021   CO2 24 08/23/2021   GLUCOSE 122 (H) 08/23/2021   BUN 17 08/23/2021   CREATININE 1.23 08/23/2021   BILITOT 0.4 08/23/2021   ALKPHOS 122 08/23/2021   AST 26 08/23/2021   ALT 34 08/23/2021   PROT 6.7 08/23/2021   ALBUMIN 3.8 08/23/2021   CALCIUM 9.7 08/23/2021   ANIONGAP 11 08/23/2021   EGFR 48 (L) 07/09/2021   Lab Results  Component Value Date   CHOL 144 07/20/2019   Lab Results  Component Value Date   HDL 32 (L) 07/20/2019   Lab Results   Component Value Date   LDLCALC 72 07/20/2019   Lab Results  Component Value Date   TRIG 246 (H) 07/20/2019   Lab Results  Component Value Date   CHOLHDL 4.5 07/20/2019   Lab Results  Component Value Date   HGBA1C  7.1 (A) 09/29/2021      Assessment & Plan:   Problem List Items Addressed This Visit       Cardiovascular and Mediastinum   HTN (hypertension)    Blood pressure not at goal plan is to add amlodipine 5 mg daily and continue valsartan HCT daily      Relevant Medications   amLODipine (NORVASC) 5 MG tablet     Respiratory   COPD with chronic bronchitis (HCC)    We will begin Levaquin 500 mg daily for 5 days and give cough syrup      Relevant Medications   fluticasone-salmeterol (ADVAIR) 250-50 MCG/ACT AEPB   promethazine-dextromethorphan (PROMETHAZINE-DM) 6.25-15 MG/5ML syrup     Endocrine   Type 2 diabetes mellitus with hyperglycemia (HCC)    No change in medications        Nervous and Auditory   Chronic low back pain with left-sided sciatica    No change in medications      Relevant Medications   paliperidone (INVEGA SUSTENNA) 156 MG/ML SUSY injection   Meds ordered this encounter  Medications   fluticasone-salmeterol (ADVAIR) 250-50 MCG/ACT AEPB    Sig: Inhale 1 puff into the lungs 2 (two) times daily.    Dispense:  1 each    Refill:  6   levofloxacin (LEVAQUIN) 500 MG tablet    Sig: Take 1 tablet (500 mg total) by mouth daily.    Dispense:  5 tablet    Refill:  0   promethazine-dextromethorphan (PROMETHAZINE-DM) 6.25-15 MG/5ML syrup    Sig: Take 5 mLs by mouth 4 (four) times daily as needed for cough.    Dispense:  118 mL    Refill:  0   amLODipine (NORVASC) 5 MG tablet    Sig: Take 1 tablet (5 mg total) by mouth daily.    Dispense:  90 tablet    Refill:  2    Follow-up: Return in about 1 month (around 12/03/2021).    Asencion Noble, MD

## 2021-11-02 NOTE — Patient Instructions (Signed)
Start Levaquin 1 tablet daily for 5 days for bronchitis  Take promethazine DM 5 mL 4 times a day as needed for cough  Begin amlodipine 5 mg daily for blood pressure elevation  No other change in your medications  Focus on reducing your tobacco intake as we discussed  Bring your blood pressure meter in at the next visit and return to see Dr. Joya Gaskins 1 month

## 2021-11-09 ENCOUNTER — Ambulatory Visit: Payer: Medicare Other | Admitting: Critical Care Medicine

## 2021-11-11 ENCOUNTER — Other Ambulatory Visit: Payer: Self-pay | Admitting: Critical Care Medicine

## 2021-11-12 ENCOUNTER — Other Ambulatory Visit: Payer: Self-pay | Admitting: Critical Care Medicine

## 2021-11-12 DIAGNOSIS — G8929 Other chronic pain: Secondary | ICD-10-CM

## 2021-11-24 ENCOUNTER — Other Ambulatory Visit: Payer: Self-pay | Admitting: Critical Care Medicine

## 2021-11-25 ENCOUNTER — Other Ambulatory Visit: Payer: Self-pay | Admitting: Critical Care Medicine

## 2021-11-25 DIAGNOSIS — E1165 Type 2 diabetes mellitus with hyperglycemia: Secondary | ICD-10-CM

## 2021-11-26 ENCOUNTER — Other Ambulatory Visit: Payer: Self-pay

## 2021-11-26 MED ORDER — OZEMPIC (0.25 OR 0.5 MG/DOSE) 2 MG/3ML ~~LOC~~ SOPN
PEN_INJECTOR | SUBCUTANEOUS | 2 refills | Status: DC
  Filled 2021-11-26: qty 3, 28d supply, fill #0

## 2021-11-26 NOTE — Telephone Encounter (Signed)
Requested Prescriptions  Pending Prescriptions Disp Refills  . Semaglutide,0.25 or 0.'5MG'$ /DOS, (OZEMPIC, 0.25 OR 0.5 MG/DOSE,) 2 MG/3ML SOPN 3 mL 2    Sig: Inject 0.5 mg subcutaneous once weekly.     Endocrinology:  Diabetes - GLP-1 Receptor Agonists - semaglutide Failed - 11/25/2021 11:21 PM      Failed - HBA1C in normal range and within 180 days    HbA1c, POC (controlled diabetic range)  Date Value Ref Range Status  09/29/2021 7.1 (A) 0.0 - 7.0 % Final         Passed - Cr in normal range and within 360 days    Creatinine  Date Value Ref Range Status  09/18/2019 119.5 20.0 - 300.0 mg/dL Final   Creatinine, Ser  Date Value Ref Range Status  08/23/2021 1.23 0.61 - 1.24 mg/dL Final         Passed - Valid encounter within last 6 months    Recent Outpatient Visits          3 weeks ago Primary hypertension   [place], Patrick E, MD   1 month ago COPD with chronic bronchitis [facility])   [place] Elsie Stain, MD   2 months ago Type 2 diabetes mellitus with hyperglycemia, with long-term current use of insulin [facility])   [place], Jarome Matin, RPH-CPP   2 months ago Type 2 diabetes mellitus with hyperglycemia, with long-term current use of insulin [facility])   [place] Elsie Stain, MD   3 months ago Type 2 diabetes mellitus with hyperglycemia, with long-term current use of insulin [facility])   [place], MD      Future Appointments            In 1 month Joya Gaskins Burnett Harry, MD [place]

## 2021-11-27 ENCOUNTER — Other Ambulatory Visit: Payer: Self-pay

## 2021-11-29 ENCOUNTER — Other Ambulatory Visit: Payer: Self-pay | Admitting: Critical Care Medicine

## 2021-11-29 DIAGNOSIS — G894 Chronic pain syndrome: Secondary | ICD-10-CM

## 2021-11-29 DIAGNOSIS — M545 Low back pain, unspecified: Secondary | ICD-10-CM

## 2021-11-30 ENCOUNTER — Other Ambulatory Visit: Payer: Self-pay

## 2021-11-30 MED ORDER — TRAMADOL HCL 50 MG PO TABS
50.0000 mg | ORAL_TABLET | Freq: Four times a day (QID) | ORAL | 0 refills | Status: DC | PRN
  Filled 2021-11-30: qty 120, 30d supply, fill #0

## 2021-11-30 NOTE — Telephone Encounter (Signed)
Requested medication (s) are due for refill today: yes  Requested medication (s) are on the active medication list: yes  Last refill:  10/28/21 #120/0  Future visit scheduled: yes  Notes to clinic:  Unable to refill per protocol, cannot delegate.    Requested Prescriptions  Pending Prescriptions Disp Refills   traMADol (ULTRAM) 50 MG tablet 120 tablet 0    Sig: Take 1 tablet (50 mg total) by mouth every 6 (six) hours as needed for moderate pain or severe pain.     Not Delegated - Analgesics:  Opioid Agonists Failed - 11/29/2021  8:47 AM      Failed - This refill cannot be delegated      Passed - Urine Drug Screen completed in last 360 days      Passed - Valid encounter within last 3 months    Recent Outpatient Visits           4 weeks ago Primary hypertension   [place] Elsie Stain, MD   2 months ago COPD with chronic bronchitis [facility])   [place] Elsie Stain, MD   2 months ago Type 2 diabetes mellitus with hyperglycemia, with long-term current use of insulin [facility])   [place], Jarome Matin, RPH-CPP   3 months ago Type 2 diabetes mellitus with hyperglycemia, with long-term current use of insulin [facility])   [place] Elsie Stain, MD   3 months ago Type 2 diabetes mellitus with hyperglycemia, with long-term current use of insulin [facility])   [place], MD       Future Appointments             In 1 week Elsie Stain, MD [place]

## 2021-12-01 ENCOUNTER — Other Ambulatory Visit: Payer: Self-pay

## 2021-12-09 ENCOUNTER — Encounter: Payer: Self-pay | Admitting: Critical Care Medicine

## 2021-12-09 ENCOUNTER — Other Ambulatory Visit: Payer: Self-pay

## 2021-12-09 ENCOUNTER — Ambulatory Visit: Payer: Medicare Other | Attending: Critical Care Medicine | Admitting: Critical Care Medicine

## 2021-12-09 VITALS — BP 128/78 | HR 64 | Wt 231.2 lb

## 2021-12-09 DIAGNOSIS — J9801 Acute bronchospasm: Secondary | ICD-10-CM

## 2021-12-09 DIAGNOSIS — Z794 Long term (current) use of insulin: Secondary | ICD-10-CM | POA: Diagnosis not present

## 2021-12-09 DIAGNOSIS — G720 Drug-induced myopathy: Secondary | ICD-10-CM

## 2021-12-09 DIAGNOSIS — F250 Schizoaffective disorder, bipolar type: Secondary | ICD-10-CM

## 2021-12-09 DIAGNOSIS — J441 Chronic obstructive pulmonary disease with (acute) exacerbation: Secondary | ICD-10-CM

## 2021-12-09 DIAGNOSIS — L853 Xerosis cutis: Secondary | ICD-10-CM | POA: Diagnosis not present

## 2021-12-09 DIAGNOSIS — G894 Chronic pain syndrome: Secondary | ICD-10-CM

## 2021-12-09 DIAGNOSIS — E1165 Type 2 diabetes mellitus with hyperglycemia: Secondary | ICD-10-CM | POA: Diagnosis not present

## 2021-12-09 DIAGNOSIS — F17200 Nicotine dependence, unspecified, uncomplicated: Secondary | ICD-10-CM

## 2021-12-09 DIAGNOSIS — F17210 Nicotine dependence, cigarettes, uncomplicated: Secondary | ICD-10-CM | POA: Diagnosis not present

## 2021-12-09 DIAGNOSIS — J449 Chronic obstructive pulmonary disease, unspecified: Secondary | ICD-10-CM

## 2021-12-09 DIAGNOSIS — M5442 Lumbago with sciatica, left side: Secondary | ICD-10-CM

## 2021-12-09 DIAGNOSIS — J4489 Other specified chronic obstructive pulmonary disease: Secondary | ICD-10-CM

## 2021-12-09 DIAGNOSIS — T466X5A Adverse effect of antihyperlipidemic and antiarteriosclerotic drugs, initial encounter: Secondary | ICD-10-CM

## 2021-12-09 DIAGNOSIS — I10 Essential (primary) hypertension: Secondary | ICD-10-CM

## 2021-12-09 DIAGNOSIS — G8929 Other chronic pain: Secondary | ICD-10-CM

## 2021-12-09 DIAGNOSIS — M545 Low back pain, unspecified: Secondary | ICD-10-CM | POA: Diagnosis not present

## 2021-12-09 LAB — GLUCOSE, POCT (MANUAL RESULT ENTRY): POC Glucose: 225 mg/dL — AB (ref 70–99)

## 2021-12-09 MED ORDER — MELOXICAM 15 MG PO TABS
15.0000 mg | ORAL_TABLET | Freq: Every day | ORAL | 0 refills | Status: DC
  Filled 2021-12-09: qty 30, 30d supply, fill #0

## 2021-12-09 MED ORDER — EZETIMIBE 10 MG PO TABS
10.0000 mg | ORAL_TABLET | Freq: Every day | ORAL | 3 refills | Status: DC
  Filled 2021-12-09 (×2): qty 90, 90d supply, fill #0

## 2021-12-09 MED ORDER — IPRATROPIUM-ALBUTEROL 0.5-2.5 (3) MG/3ML IN SOLN
3.0000 mL | Freq: Four times a day (QID) | RESPIRATORY_TRACT | 1 refills | Status: DC | PRN
Start: 2021-12-09 — End: 2022-05-03
  Filled 2021-12-09: qty 180, 15d supply, fill #0

## 2021-12-09 MED ORDER — PROMETHAZINE-DM 6.25-15 MG/5ML PO SYRP
5.0000 mL | ORAL_SOLUTION | Freq: Four times a day (QID) | ORAL | 0 refills | Status: DC | PRN
  Filled 2021-12-09: qty 118, 6d supply, fill #0

## 2021-12-09 MED ORDER — OZEMPIC (0.25 OR 0.5 MG/DOSE) 2 MG/3ML ~~LOC~~ SOPN
PEN_INJECTOR | SUBCUTANEOUS | 2 refills | Status: DC
  Filled 2021-12-09: qty 3, fill #0
  Filled 2021-12-16: qty 3, 28d supply, fill #0
  Filled 2021-12-16: qty 3, 30d supply, fill #0

## 2021-12-09 MED ORDER — VENTOLIN HFA 108 (90 BASE) MCG/ACT IN AERS
2.0000 | INHALATION_SPRAY | RESPIRATORY_TRACT | 3 refills | Status: DC | PRN
Start: 2021-12-09 — End: 2022-04-13
  Filled 2021-12-09: qty 18, 16d supply, fill #0
  Filled 2022-04-13: qty 18, 25d supply, fill #1

## 2021-12-09 MED ORDER — OMEPRAZOLE 20 MG PO CPDR
20.0000 mg | DELAYED_RELEASE_CAPSULE | Freq: Every day | ORAL | 3 refills | Status: DC
  Filled 2021-12-09 – 2021-12-30 (×2): qty 30, 30d supply, fill #0
  Filled 2022-01-27: qty 30, 30d supply, fill #1
  Filled 2022-03-10: qty 30, 30d supply, fill #2
  Filled 2022-04-13: qty 30, 30d supply, fill #3

## 2021-12-09 MED ORDER — TRAMADOL HCL 50 MG PO TABS
50.0000 mg | ORAL_TABLET | Freq: Four times a day (QID) | ORAL | 0 refills | Status: DC | PRN
  Filled 2021-12-09 – 2021-12-30 (×2): qty 120, 30d supply, fill #0

## 2021-12-09 MED ORDER — GABAPENTIN 300 MG PO CAPS
600.0000 mg | ORAL_CAPSULE | Freq: Two times a day (BID) | ORAL | 3 refills | Status: DC
  Filled 2021-12-09: qty 240, 60d supply, fill #0
  Filled 2022-03-18: qty 240, 60d supply, fill #1

## 2021-12-09 MED ORDER — VALSARTAN-HYDROCHLOROTHIAZIDE 320-25 MG PO TABS
1.0000 | ORAL_TABLET | Freq: Every day | ORAL | 3 refills | Status: DC
Start: 2021-12-09 — End: 2022-08-03
  Filled 2021-12-09: qty 90, 90d supply, fill #0
  Filled 2022-03-22: qty 90, 90d supply, fill #1
  Filled 2022-06-25: qty 30, 30d supply, fill #2
  Filled 2022-07-20: qty 30, 30d supply, fill #3

## 2021-12-09 MED ORDER — JARDIANCE 25 MG PO TABS
ORAL_TABLET | ORAL | 2 refills | Status: DC
  Filled 2021-12-09: qty 30, 30d supply, fill #0
  Filled 2022-01-07 – 2022-01-14 (×2): qty 30, 30d supply, fill #1

## 2021-12-09 MED ORDER — SUMATRIPTAN SUCCINATE 50 MG PO TABS
50.0000 mg | ORAL_TABLET | ORAL | 1 refills | Status: DC | PRN
  Filled 2021-12-09: qty 20, 60d supply, fill #0
  Filled 2022-02-12: qty 20, 60d supply, fill #1

## 2021-12-09 NOTE — Assessment & Plan Note (Addendum)
  .   Current smoking consumption amount: 10 cigarettes a day  . Dicsussion on advise to quit smoking and smoking impacts: Cardiovascular lung impacts  . Patient's willingness to quit: Not ready to quit  . Methods to quit smoking discussed: Behavioral modification  . Medication management of smoking session drugs discussed: Not indicated  . Resources provided:  AVS   . Setting quit date not established  Follow-up arranged 4 month  Time spent counseling the patient: 5 minutes

## 2021-12-09 NOTE — Assessment & Plan Note (Signed)
Renewed tramadol

## 2021-12-09 NOTE — Progress Notes (Signed)
Established Patient Office Visit  Subjective:  Patient ID: Joshua [name], male    DOB: Jul 20, 1955  Age: 66 y.o. MRN: [id number]  CC:  Cough  HPI 04/28/21 Joshua [name] presents for primary care follow-up for diabetes and hypertension.  Patient continues to smoke 5 to 6 cigarettes daily at this time.  He has not been compliant with his diet occasionally his fasting sugars are 265-220.  When I review his glucometer his blood sugar ranges anywhere from 1 80-2 10 in the morning fasting.  He self increased his insulin glargine to [name] units a day.  He ran out of refills and cannot get an early refill with this medication.  The patient wishes to hold off on his pneumonia vaccine at this visit.  Patient does have a Cologuard kit at home he states he will have this process soon.  Patient is needing a new nebulizer cup and mouthpiece for his nebulizer machine.  He does maintain the Trulicity weekly along with the insulin glargine and the Jardiance 25 mg daily note he cannot tolerate metformin due to side effects.  Patient has a chronic pain management contract that he has been compliant with and needs refills on tramadol.  Drug database was checked and showed no frequent multiple prescribers no evidence of drug diversion  Patient's mental health status is stable at this time he is on monthly Invega injections at this time  4/17 Patient returns for primary care follow-up with complaints of pain in the right and left lower extremity.   Patient with history of significant delusion.  He thinks he has been bit by [name].  Patient also is concerned about tick infestation. Patient is followed by [name] and receives [name] injections on a monthly basis.  He would like to receive these here but was explained that he needs to stay with his mental health provider for same.  Patient is having increased wheezing and productive cough of green mucus.  He is still actively smoking.  09/01/21 Patient is  seen as a work in post ER visit.  He was sent by the police upon urging of his spouse [name] to the emergency room at [name] for evaluation by psychiatry.  He had wildly psychotic ideation.  He thought he was [employer], [employer].  He was screaming out at neighbors and bothering him.  Patient was seen by psychiatry no intervention made and was declared to not be a threat to himself or others and was discharged home.  He has since been to [name] and receives his monthly Invega injection although at a lower dose. The patient has been referred to the below mental [name] but the patient's spouse is unclear where to go and needs this information to make the appointment.  The patient is here today by himself. [name] [address], [place], [name] [id number] [phone] Your referral was sent here for therapy and psychiatry  Today the patient is fairly cooperative however he is definitely psychotic he thinks he sees ghosts and snakes and sees things moving in the room.  He is cooperative and is not suicidal or homicidal.  Blood pressure initially on arrival was 170/105 but on recheck was 151/96.  He is only on the valsartan 80 mg daily. Patient is still actively smoking  6/6 Patient seen in return follow-up and states he is having increased sore throat increased cough productive of brown mucus shortness of breath as well.  He is  still smoking about a half a pack a day of cigarettes.  He states his blood sugars improved on Ozempic and he is lost some weight.  Blood pressure does remain elevated 168/99.  Patient does maintain valsartan HCT 160.  Blood pressure meter is needed for the patient.  He would also like a refill on Prilosec.  There are no other complaints. The patient elected to return to [name] for his Invega injections and is improved mentally  7/10 Patient returns with increased cough productive of white to yellow-green mucus he took a course of  cefdinir with some improvement now has worsened on arrival blood pressure elevated 151/94 on repeat is 148/92 patient does continue with his Invega injections monthly at [name] patient notes some wheezing  8/16 Patient is seen in return follow-up from a COPD perspective he is breathing better he is coughing less wheezing less.  His smoking is down to half a pack a day of cigarettes.  On arrival blood pressure is good 128/78 but blood sugar is 225.  Patient is been on the 0.5 mg weekly Ozempic at this time.  He cannot take statins and he has elevated cholesterol.  Patient needs follow-up metabolic panel and lipid panel at this visit. Patient still has chronic pain in both hips and knees.  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   Chronic pain    Diabetes mellitus without complication (HCC)    Generalized anxiety disorder    Headache    Hypertension    Schizoaffective disorder ([place])     Past Surgical History:  Procedure Laterality Date   APPENDECTOMY  1970    Family History  Problem Relation Age of Onset   Diabetes Mother    Cancer Mother        unsure what type   Heart disease Mother    Hypertension Mother    Dementia Father    Diabetes Brother    Dementia Maternal Grandfather    Cancer Paternal Grandmother 71       breast   Mental illness Neg Hx     Social History   Socioeconomic History   Marital status: Married    Spouse name: Not on file   Number of children: Not on file   Years of education: Not on file   Highest education level: Not on file  Occupational History   Not on file  Tobacco Use   Smoking status: Every Day    Packs/day: 2.00    Years: 50.00    Total pack years: 100.00    Types: Cigarettes   Smokeless tobacco: Never  Vaping Use    Vaping Use: Never used  Substance and Sexual Activity   Alcohol use: Not Currently   Drug use: Yes    Types: Marijuana    Comment: "have changed to hemp now" 09/17/17   Sexual activity: Yes  Other Topics Concern   Not on file  Social History Narrative   Not on file   Social Determinants of Health   Financial Resource Strain: Low Risk  (10/01/2021)   Overall Financial Resource Strain (CARDIA)    Difficulty of Paying Living Expenses: Not hard at all  Food Insecurity: No Food Insecurity (10/01/2021)   Hunger Vital Sign    Worried About Running Out of Food in  the Last Year: Never true    [place] in the Last Year: Never true  Transportation Needs: No Transportation Needs (10/01/2021)   PRAPARE - Hydrologist (Medical): No    Lack of Transportation (Non-Medical): No  Physical Activity: Inactive (10/01/2021)   Exercise Vital Sign    Days of Exercise per Week: 0 days    Minutes of Exercise per Session: 0 min  Stress: No Stress Concern Present (10/01/2021)   [place]    Feeling of Stress : Not at all  Social Connections: Not on file  Intimate Partner Violence: Not on file    Outpatient Medications Prior to Visit  Medication Sig Dispense Refill   Accu-Chek FastClix Lancets MISC Use to check blood sugars three times per day 102 each 11   acyclovir ointment (ZOVIRAX) 5 % Apply 1 application. topically every 6 (six) hours. Apply to lips/peri oral area 15 g 0   amLODipine (NORVASC) 5 MG tablet Take 1 tablet (5 mg total) by mouth daily. 90 tablet 2   blood glucose meter kit and supplies KIT Dispense based on patient and insurance preference. Use up to four times daily as directed. ICD-10 E11.65  Z79.4 1 each 0   Blood Glucose Monitoring Suppl (ACCU-CHEK GUIDE ME) w/Device KIT Use to check blood sugars up to 3 times per day. ICD-10 E11.65, Z79.4 1 kit 0   Blood Pressure Monitoring (BLOOD PRESSURE  KIT) DEVI Use to measure blood pressure 1 each 0   cetirizine (ZYRTEC) 10 MG tablet Take 1 tablet (10 mg total) by mouth daily. [name] tablet 11   clotrimazole (LOTRIMIN) 1 % cream Apply to affected area 2 times daily 15 g 0   divalproex (DEPAKOTE ER) 500 MG 24 hr tablet Take 1,500 mg by mouth at bedtime.     fluticasone-salmeterol (ADVAIR) 250-50 MCG/ACT AEPB Inhale 1 puff into the lungs 2 (two) times daily. 1 each 6   glucose blood (ACCU-CHEK GUIDE) test strip Use as instructed to check blood sugars three times per day. ICD-10 E11.65 and Z79.4 100 each 11   hydrOXYzine (ATARAX) 50 MG tablet Take 1 tablet (50 mg total) by mouth 3 (three) times daily. 90 tablet 2   Lancets Misc. (ACCU-CHEK FASTCLIX LANCET) KIT Use to check blood sugars 3 times per day 1 kit 11   paliperidone (INVEGA SUSTENNA) 156 MG/ML SUSY injection      Respiratory Therapy Supplies (FLUTTER) DEVI Use with 4 times daily 1 each 0   Skin Protectants, Misc. (EUCERIN) cream Apply topically as needed for dry skin. 454 g 0   Spacer/Aero-Holding Chambers (AEROCHAMBER MV) inhaler Use as instructed 1 each 0   diclofenac (VOLTAREN) 50 MG EC tablet TAKE 1 TABLET BY MOUTH TWICE DAILY AS NEEDED FOR MODERATE PAIN. EAT BEFORE TAKING THE MEDICINE 60 tablet 2   ipratropium-albuterol (DUONEB) 0.5-2.5 (3) MG/3ML SOLN Take 3 mLs by nebulization every 6 (six) hours. (Patient taking differently: Take 3 mLs by nebulization every 6 (six) hours as needed (shortness of breath).) 360 mL 0   JARDIANCE 25 MG TABS tablet TAKE 1 TABLET(25 MG) BY MOUTH DAILY [name] tablet 2   levofloxacin (LEVAQUIN) 500 MG tablet Take 1 tablet (500 mg total) by mouth daily. 5 tablet 0   omeprazole (PRILOSEC) 20 MG capsule Take 1 capsule (20 mg total) by mouth daily. [name] capsule 3   promethazine-dextromethorphan (PROMETHAZINE-DM) 6.25-15 MG/5ML syrup Take 5 mLs by mouth 4 (  four) times daily as needed for cough. 118 mL 0   Semaglutide,0.25 or 0.5MG /DOS, (OZEMPIC, 0.25 OR 0.5 MG/DOSE,) 2  MG/3ML SOPN Inject 0.5 mg subcutaneous once weekly. 3 mL 2   SUMAtriptan (IMITREX) 50 MG tablet Take 1 tablet (50 mg total) by mouth every 2 (two) hours as needed for migraine. May repeat in 2 hours if headache persists or recurs. 20 tablet 1   traMADol (ULTRAM) 50 MG tablet Take 1 tablet (50 mg total) by mouth every 6 (six) hours as needed for moderate pain or severe pain. 120 tablet 0   valsartan-hydrochlorothiazide (DIOVAN-HCT) 320-25 MG tablet Take 1 tablet by mouth daily. 90 tablet 3   VENTOLIN HFA 108 (90 Base) MCG/ACT inhaler Inhale 2 puffs into the lungs every 4 (four) hours as needed for wheezing or shortness of breath. 18 g 3   INVEGA SUSTENNA injection Inject 117 mg into the muscle every [name] (thirty) days.     gabapentin (NEURONTIN) 300 MG capsule Take 2 capsules (600 mg total) by mouth 2 (two) times daily. 240 capsule 3   No facility-administered medications prior to visit.    Allergies  Allergen Reactions   Statins Other (See Comments)    Myopathy   Haldol [Haloperidol] Other (See Comments)    Unk reaction   Metformin And Related Diarrhea and Nausea And Vomiting   Trazodone And Nefazodone Itching    ROS Review of Systems  Constitutional: Negative.   HENT:  Negative for ear pain, postnasal drip, rhinorrhea, sinus pressure, sore throat, trouble swallowing and voice change.   Eyes: Negative.   Respiratory:  Negative for apnea, cough, choking, chest tightness, shortness of breath, wheezing and stridor.   Cardiovascular: Negative.  Negative for chest pain, palpitations and leg swelling.  Gastrointestinal: Negative.  Negative for abdominal distention, abdominal pain, nausea and vomiting.  Genitourinary: Negative.   Musculoskeletal:  Positive for back pain. Negative for arthralgias and myalgias.  Skin: Negative.  Negative for rash.  Allergic/Immunologic: Negative.  Negative for environmental allergies and food allergies.  Neurological: Negative.  Negative for dizziness, syncope  and weakness.  Hematological: Negative.  Negative for adenopathy. Does not bruise/bleed easily.  Psychiatric/Behavioral:  Negative for agitation, behavioral problems, confusion, decreased concentration, dysphoric mood, hallucinations, self-injury, sleep disturbance and suicidal ideas. The patient is not nervous/anxious and is not hyperactive.       Objective:    Physical Exam Vitals reviewed.  Constitutional:      Appearance: Normal appearance. He is well-developed. He is obese. He is not diaphoretic.  HENT:     Head: Normocephalic and atraumatic.     Nose: Nose normal. No nasal deformity, septal deviation, mucosal edema or rhinorrhea.     Right Sinus: No maxillary sinus tenderness or frontal sinus tenderness.     Left Sinus: No maxillary sinus tenderness or frontal sinus tenderness.     Mouth/Throat:     Mouth: Mucous membranes are moist.     Pharynx: Oropharynx is clear. No oropharyngeal exudate.     Comments: Poor dentition with multiple dental caries Eyes:     General: No scleral icterus.    Conjunctiva/sclera: Conjunctivae normal.     Pupils: Pupils are equal, round, and reactive to light.  Neck:     Thyroid: No thyromegaly.     Vascular: No carotid bruit or JVD.     Trachea: Trachea normal. No tracheal tenderness or tracheal deviation.  Cardiovascular:     Rate and Rhythm: Normal rate and regular rhythm.  Chest Wall: PMI is not displaced.     Pulses: Normal pulses. No decreased pulses.     Heart sounds: Normal heart sounds, S1 normal and S2 normal. Heart sounds not distant. No murmur heard.    No systolic murmur is present.     No diastolic murmur is present.     No friction rub. No gallop. No S3 or S4 sounds.  Pulmonary:     Effort: Pulmonary effort is normal. No tachypnea, accessory muscle usage or respiratory distress.     Breath sounds: No stridor. No decreased breath sounds, wheezing, rhonchi or rales.  Chest:     Chest wall: No tenderness.  Abdominal:      General: Bowel sounds are normal. There is no distension.     Palpations: Abdomen is soft. Abdomen is not rigid.     Tenderness: There is no abdominal tenderness. There is no guarding or rebound.  Musculoskeletal:        General: Normal range of motion.     Cervical back: Normal range of motion and neck supple. No edema, erythema or rigidity. No muscular tenderness. Normal range of motion.  Lymphadenopathy:     Head:     Right side of head: No submental or submandibular adenopathy.     Left side of head: No submental or submandibular adenopathy.     Cervical: No cervical adenopathy.  Skin:    General: Skin is warm and dry.     Coloration: Skin is not pale.     Findings: No rash.     Nails: There is no clubbing.  Neurological:     Mental Status: He is alert and oriented to person, place, and time.     Sensory: No sensory deficit.  Psychiatric:        Attention and Perception: Attention normal. He does not perceive auditory or visual hallucinations.        Mood and Affect: Affect is not labile.        Speech: Speech is not tangential.        Behavior: Behavior is not agitated. Behavior is cooperative.        Thought Content: Thought content is not paranoid or delusional. Thought content does not include homicidal or suicidal ideation. Thought content does not include homicidal or suicidal plan.        Cognition and Memory: Cognition is not impaired. Memory is not impaired.        Judgment: Judgment is not impulsive or inappropriate.     BP 128/78   Pulse 64   Wt 231 lb 3.2 oz (104.9 kg)   SpO2 94%   BMI 34.14 kg/m  Wt Readings from Last 3 Encounters:  12/09/21 231 lb 3.2 oz (104.9 kg)  11/02/21 222 lb 12.8 oz (101.1 kg)  09/29/21 222 lb 12.8 oz (101.1 kg)     Health Maintenance Due  Topic Date Due   Fecal DNA (Cologuard)  Never done   OPHTHALMOLOGY EXAM  09/04/2021    There are no preventive care reminders to display for this patient.  Lab Results  Component Value  Date   TSH 1.140 08/28/2018   Lab Results  Component Value Date   WBC 10.0 04/[name]/2023   HGB 15.4 04/[name]/2023   HCT 46.9 04/[name]/2023   MCV 92.3 04/[name]/2023   PLT 216 04/[name]/2023   Lab Results  Component Value Date   NA 136 04/[name]/2023   K 4.3 04/[name]/2023   CO2 24 04/[name]/2023   GLUCOSE 122 (H)  04/[name]/2023   BUN 17 04/[name]/2023   CREATININE 1.23 04/[name]/2023   BILITOT 0.4 04/[name]/2023   ALKPHOS 122 04/[name]/2023   AST 26 04/[name]/2023   ALT 34 04/[name]/2023   PROT 6.7 04/[name]/2023   ALBUMIN 3.8 04/[name]/2023   CALCIUM 9.7 04/[name]/2023   ANIONGAP 11 04/[name]/2023   EGFR 48 (L) 07/09/2021   Lab Results  Component Value Date   CHOL 144 07/20/2019   Lab Results  Component Value Date   HDL 32 (L) 07/20/2019   Lab Results  Component Value Date   LDLCALC 72 07/20/2019   Lab Results  Component Value Date   TRIG 246 (H) 07/20/2019   Lab Results  Component Value Date   CHOLHDL 4.5 07/20/2019   Lab Results  Component Value Date   HGBA1C 7.1 (A) 09/29/2021      Assessment & Plan:   Problem List Items Addressed This Visit       Cardiovascular and Mediastinum   HTN (hypertension)    Blood pressure now at goal no change in medicines      Relevant Medications   valsartan-hydrochlorothiazide (DIOVAN-HCT) 320-25 MG tablet   ezetimibe (ZETIA) 10 MG tablet   Other Relevant Orders   BMP8+eGFR   Lipid panel     Respiratory   COPD with chronic bronchitis (HCC)    COPD with chronic bronchitis  No change in inhaled medications continue same      Relevant Medications   ipratropium-albuterol (DUONEB) 0.5-2.5 (3) MG/3ML SOLN   promethazine-dextromethorphan (PROMETHAZINE-DM) 6.25-15 MG/5ML syrup   VENTOLIN HFA 108 (90 Base) MCG/ACT inhaler     Endocrine   Type 2 diabetes mellitus with hyperglycemia (HCC) - Primary    Plan to increase Ozempic to 1 mg weekly      Relevant Medications   JARDIANCE 25 MG TABS tablet   Semaglutide,0.25 or 0.5MG /DOS, (OZEMPIC, 0.25 OR 0.5 MG/DOSE,) 2 MG/3ML SOPN    valsartan-hydrochlorothiazide (DIOVAN-HCT) 320-25 MG tablet   Other Relevant Orders   POCT glucose (manual entry) (Completed)   BMP8+eGFR   Lipid panel     Nervous and Auditory   Chronic low back pain with left-sided sciatica    Renewed tramadol      Relevant Medications   INVEGA SUSTENNA injection   traMADol (ULTRAM) 50 MG tablet   gabapentin (NEURONTIN) 300 MG capsule   meloxicam (MOBIC) 15 MG tablet     Musculoskeletal and Integument   Statin myopathy    Cannot take statins we will try Zetia        Other   Schizoaffective disorder, bipolar type (HCC)    Tolerating [facility]ga injections at [facility]      Chronic pain syndrome   Relevant Medications   traMADol (ULTRAM) 50 MG tablet   gabapentin (NEURONTIN) 300 MG capsule   meloxicam (MOBIC) 15 MG tablet   Tobacco dependence       Current smoking consumption amount: 10 cigarettes a day  Dicsussion on advise to quit smoking and smoking impacts: Cardiovascular lung impacts  Patient's willingness to quit: Not ready to quit  Methods to quit smoking discussed: Behavioral modification  Medication management of smoking session drugs discussed: Not indicated  Resources provided:  AVS   Setting quit date not established  Follow-up arranged 4 month  Time spent counseling the patient: 5 minutes       Other Visit Diagnoses     Dry skin       Low back pain with radiation       Relevant Medications  traMADol (ULTRAM) 50 MG tablet   meloxicam (MOBIC) 15 MG tablet   Bronchospasm       Relevant Medications   VENTOLIN HFA 108 (90 Base) MCG/ACT inhaler   COPD with acute exacerbation (HCC)       Relevant Medications   ipratropium-albuterol (DUONEB) 0.5-2.5 (3) MG/3ML SOLN   promethazine-dextromethorphan (PROMETHAZINE-DM) 6.25-15 MG/5ML syrup   VENTOLIN HFA 108 (90 Base) MCG/ACT inhaler      Meds ordered this encounter  Medications   ipratropium-albuterol (DUONEB) 0.5-2.5 (3) MG/3ML SOLN    Sig: Take 3 mLs  by nebulization every 6 (six) hours as needed (shortness of breath).    Dispense:  120 mL    Refill:  1    [facility], for future refill, pt will call when needed   JARDIANCE 25 MG TABS tablet    Sig: TAKE 1 TABLET(25 MG) BY MOUTH DAILY    Dispense:  [name] tablet    Refill:  2   omeprazole (PRILOSEC) 20 MG capsule    Sig: Take 1 capsule (20 mg total) by mouth daily.    Dispense:  [name] capsule    Refill:  3   promethazine-dextromethorphan (PROMETHAZINE-DM) 6.25-15 MG/5ML syrup    Sig: Take 5 mLs by mouth 4 (four) times daily as needed for cough.    Dispense:  118 mL    Refill:  0   Semaglutide,0.25 or 0.5MG /DOS, (OZEMPIC, 0.25 OR 0.5 MG/DOSE,) 2 MG/3ML SOPN    Sig: Inject 1.0 mg subcutaneous once weekly.    Dispense:  3 mL    Refill:  2    Dose change   SUMAtriptan (IMITREX) 50 MG tablet    Sig: Take 1 tablet (50 mg total) by mouth every 2 (two) hours as needed for migraine. May repeat in 2 hours if headache persists or recurs.    Dispense:  20 tablet    Refill:  1   traMADol (ULTRAM) 50 MG tablet    Sig: Take 1 tablet (50 mg total) by mouth every 6 (six) hours as needed for moderate pain or severe pain.    Dispense:  120 tablet    Refill:  0    May fill early 09/02/21   valsartan-hydrochlorothiazide (DIOVAN-HCT) 320-25 MG tablet    Sig: Take 1 tablet by mouth daily.    Dispense:  90 tablet    Refill:  3   VENTOLIN HFA 108 (90 Base) MCG/ACT inhaler    Sig: Inhale 2 puffs into the lungs every 4 (four) hours as needed for wheezing or shortness of breath.    Dispense:  18 g    Refill:  3   gabapentin (NEURONTIN) 300 MG capsule    Sig: Take 2 capsules (600 mg total) by mouth 2 (two) times daily.    Dispense:  240 capsule    Refill:  3   meloxicam (MOBIC) 15 MG tablet    Sig: Take 1 tablet (15 mg total) by mouth daily.    Dispense:  [name] tablet    Refill:  0   ezetimibe (ZETIA) 10 MG tablet    Sig: Take 1 tablet (10 mg total) by mouth daily.    Dispense:  90 tablet     Refill:  3    Follow-up: Return in about 4 months (around 04/10/2022) for copd, htn, diabetes.    Asencion Noble, MD

## 2021-12-09 NOTE — Assessment & Plan Note (Signed)
Blood pressure now at goal no change in medicines

## 2021-12-09 NOTE — Assessment & Plan Note (Signed)
Plan to increase Ozempic to 1 mg weekly

## 2021-12-09 NOTE — Patient Instructions (Signed)
Refills on all your medications sent to our pharmacy downstairs  Increase Ozempic to 1 mg weekly  Begin Zetia 1 pill daily for cholesterol  Trial meloxicam 1 pill daily for arthritis  Tramadol was refilled  Reduce tobacco intake as we discussed down to 5 to 6 cigarettes daily  Labs today include metabolic panel and cholesterol level  Return to see Dr. Joya Gaskins 4 months

## 2021-12-09 NOTE — Assessment & Plan Note (Signed)
Cannot take statins we will try Zetia

## 2021-12-09 NOTE — Assessment & Plan Note (Signed)
COPD with chronic bronchitis  No change in inhaled medications continue same

## 2021-12-09 NOTE — Assessment & Plan Note (Signed)
Tolerating Invega injections at [facility]

## 2021-12-10 ENCOUNTER — Other Ambulatory Visit: Payer: Self-pay

## 2021-12-10 LAB — LIPID PANEL
Chol/HDL Ratio: 4.3 ratio (ref 0.0–5.0)
Cholesterol, Total: 205 mg/dL — ABNORMAL HIGH (ref 100–199)
HDL: 48 mg/dL (ref >39)
LDL Chol Calc (NIH): 110 mg/dL — ABNORMAL HIGH (ref 0–99)
Triglycerides: 276 mg/dL — ABNORMAL HIGH (ref 0–149)
VLDL Cholesterol Cal: 47 mg/dL — ABNORMAL HIGH (ref 5–40)

## 2021-12-10 LAB — BMP8+EGFR
BUN/Creatinine Ratio: 27 — ABNORMAL HIGH (ref 10–24)
BUN: 38 mg/dL — ABNORMAL HIGH (ref 8–27)
CO2: 19 mmol/L — ABNORMAL LOW (ref 20–29)
Calcium: 9.8 mg/dL (ref 8.6–10.2)
Chloride: 97 mmol/L (ref 96–106)
Creatinine, Ser: 1.39 mg/dL — ABNORMAL HIGH (ref 0.76–1.27)
Glucose: 204 mg/dL — ABNORMAL HIGH (ref 70–99)
Potassium: 5.7 mmol/L — ABNORMAL HIGH (ref 3.5–5.2)
Sodium: 139 mmol/L (ref 134–144)
eGFR: 57 mL/min/{1.73_m2} — ABNORMAL LOW (ref >59)

## 2021-12-10 NOTE — Progress Notes (Signed)
Let pt know kidney stable potassium is high will monitor.  Cholesterol still high so start the zetia we orderd daily

## 2021-12-15 ENCOUNTER — Telehealth: Payer: Self-pay

## 2021-12-15 NOTE — Telephone Encounter (Signed)
-----   Message from Elsie Stain, MD sent at 12/10/2021 10:22 AM EDT ----- Let pt know kidney stable potassium is high will monitor.  Cholesterol still high so start the zetia we orderd daily

## 2021-12-15 NOTE — Telephone Encounter (Signed)
Pt was called and is aware of results, DOB was confirmed.  ?

## 2021-12-16 ENCOUNTER — Other Ambulatory Visit: Payer: Self-pay

## 2021-12-17 ENCOUNTER — Other Ambulatory Visit: Payer: Self-pay | Admitting: Pharmacist

## 2021-12-17 ENCOUNTER — Other Ambulatory Visit: Payer: Self-pay

## 2021-12-17 MED ORDER — SEMAGLUTIDE (1 MG/DOSE) 4 MG/3ML ~~LOC~~ SOPN
1.0000 mg | PEN_INJECTOR | SUBCUTANEOUS | 2 refills | Status: DC
  Filled 2021-12-17: qty 3, 28d supply, fill #0
  Filled 2022-01-07 – 2022-01-14 (×2): qty 3, 28d supply, fill #1
  Filled 2022-02-11: qty 3, 28d supply, fill #2

## 2021-12-19 ENCOUNTER — Other Ambulatory Visit: Payer: Self-pay | Admitting: Critical Care Medicine

## 2021-12-21 NOTE — Telephone Encounter (Signed)
Refilled 12/09/2021 #30 2 refills. Requested Prescriptions  Pending Prescriptions Disp Refills  . empagliflozin (JARDIANCE) 25 MG TABS tablet [Pharmacy Med Name: Jardiance 25 mg tablet] 30 tablet 0    Sig: TAKE ONE TABLET BY MOUTH DAILY AT 9AM     Endocrinology:  Diabetes - SGLT2 Inhibitors Failed - 12/19/2021  9:37 AM      Failed - Cr in normal range and within 360 days    Creatinine  Date Value Ref Range Status  09/18/2019 119.5 20.0 - 300.0 mg/dL Final   Creatinine, Ser  Date Value Ref Range Status  12/09/2021 1.39 (H) 0.76 - 1.27 mg/dL Final         Failed - eGFR in normal range and within 360 days    GFR calc Af Amer  Date Value Ref Range Status  03/03/2020 70 >59 mL/min/1.73 Final    Comment:    **In accordance with recommendations from the NKF-ASN Task force,**   [facility] is in the process of updating its eGFR calculation to the   2021 CKD-EPI creatinine equation that estimates kidney function   without a race variable.    GFR, Estimated  Date Value Ref Range Status  08/23/2021 >60 >60 mL/min Final    Comment:    (NOTE) Calculated using the CKD-EPI Creatinine Equation (2021)    eGFR  Date Value Ref Range Status  12/09/2021 57 (L) >59 mL/min/1.73 Final         Passed - HBA1C is between 0 and 7.9 and within 180 days    HbA1c, POC (controlled diabetic range)  Date Value Ref Range Status  09/29/2021 7.1 (A) 0.0 - 7.0 % Final         Passed - Valid encounter within last 6 months    Recent Outpatient Visits          1 week ago Type 2 diabetes mellitus with hyperglycemia, with long-term current use of insulin ([place])   [place] Elsie Stain, MD   1 month ago Primary hypertension   [place] Elsie Stain, MD   2 months ago COPD with chronic bronchitis [facility])   [place] Elsie Stain, MD   3 months ago Type 2 diabetes mellitus with hyperglycemia, with  long-term current use of insulin [facility])   [place], Jarome Matin, RPH-CPP   3 months ago Type 2 diabetes mellitus with hyperglycemia, with long-term current use of insulin [facility])   [place], MD      Future Appointments            In 3 months Joya Gaskins Burnett Harry, MD [place]

## 2021-12-30 ENCOUNTER — Other Ambulatory Visit: Payer: Self-pay

## 2021-12-31 ENCOUNTER — Other Ambulatory Visit: Payer: Self-pay

## 2022-01-01 IMAGING — CT CT HEAD W/O CM
3 of 4 series · 13 of 47 positions shown, 15 images · non-contrast
Comparison: None.

CLINICAL DATA: Subacute neurologic deficit with paralytic sensation
of the extremities.

EXAM:
CT HEAD WITHOUT CONTRAST
TECHNIQUE: Contiguous axial images were obtained from the base of the skull
through the vertex without intravenous contrast.

[Series 3: head without · axial · non-contrast · 0.43mm/px · z∈[-175,-55]mm · 7 of 34 slices shown, 9 images]
[im 5/34  brain]
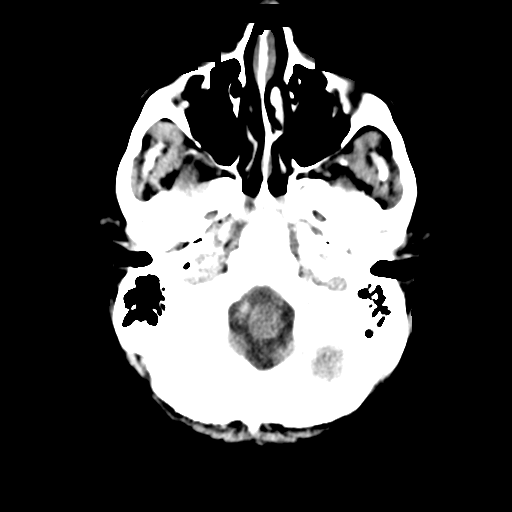
[im 5/34  bone]
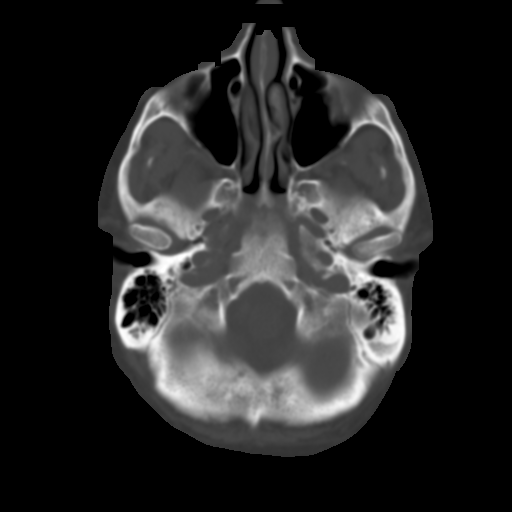
[im 9/34  brain]
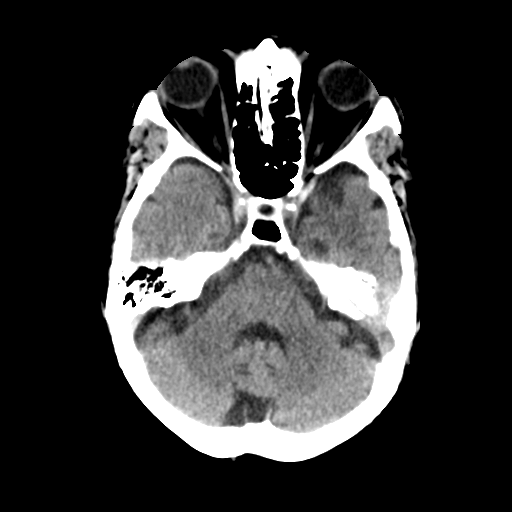
[im 13/34  brain]
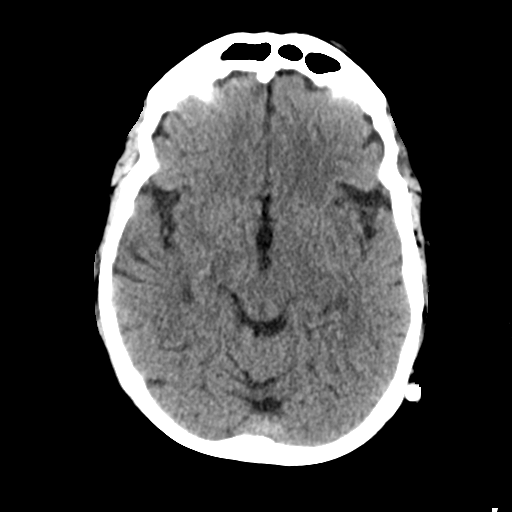
[im 17/34  brain]
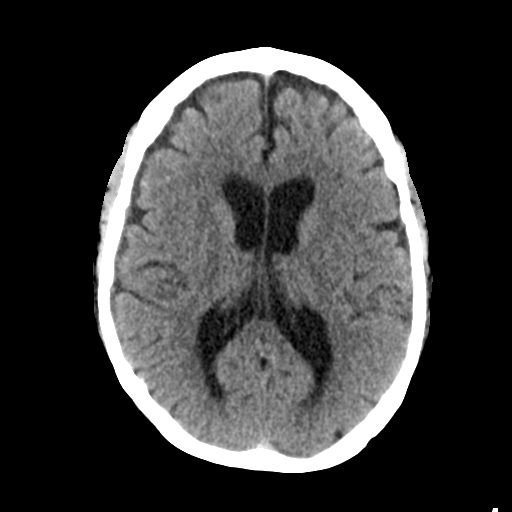
[im 21/34  brain]
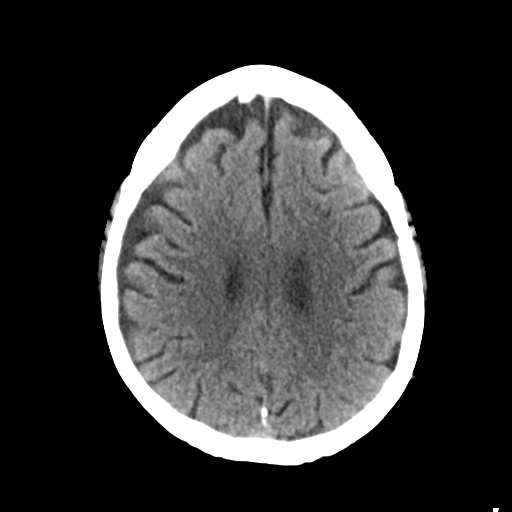
[im 21/34  bone]
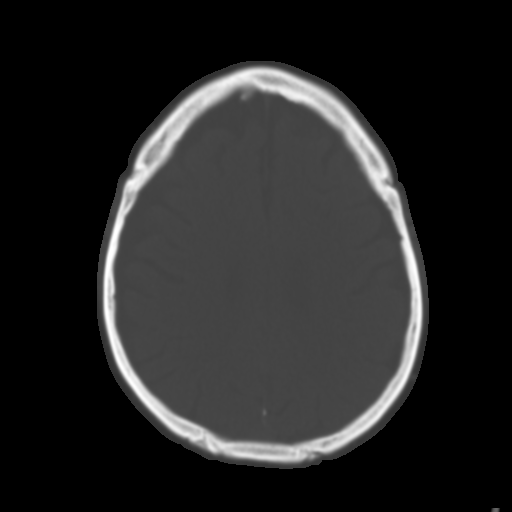
[im 25/34  brain]
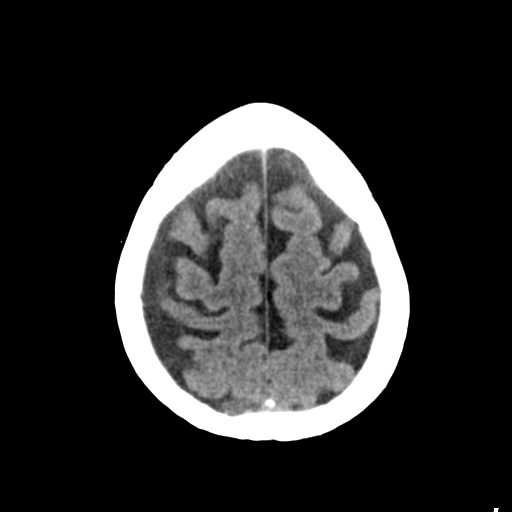
[im 29/34  brain]
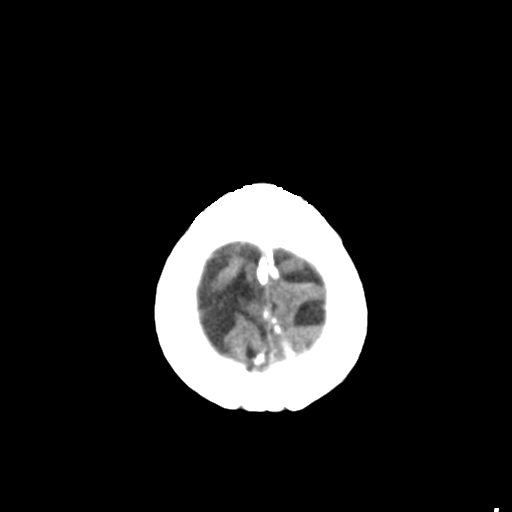

[Series 5: head without cor · coronal · non-contrast · 0.33mm/px · 3 of 67 slices shown]
[im 23/67  brain]
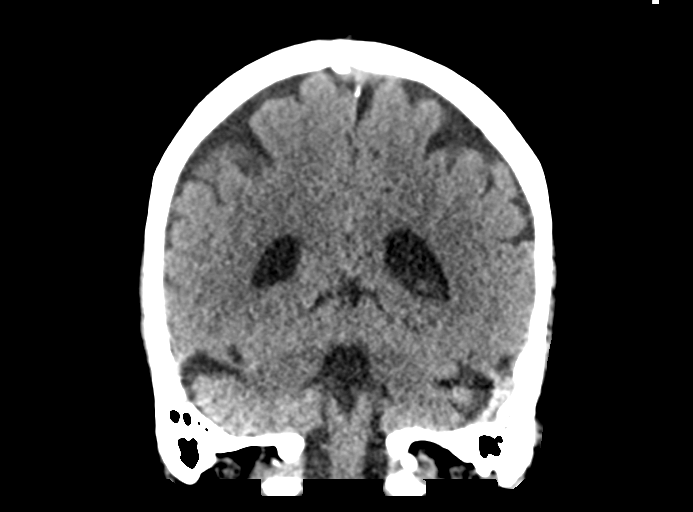
[im 30/67  brain]
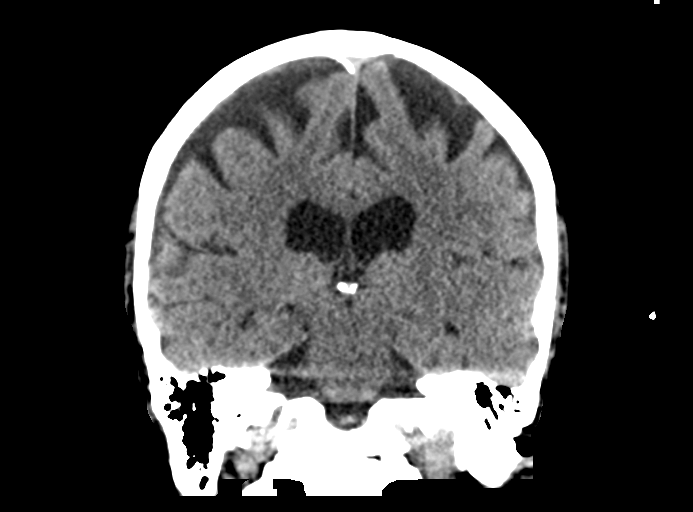
[im 37/67  brain]
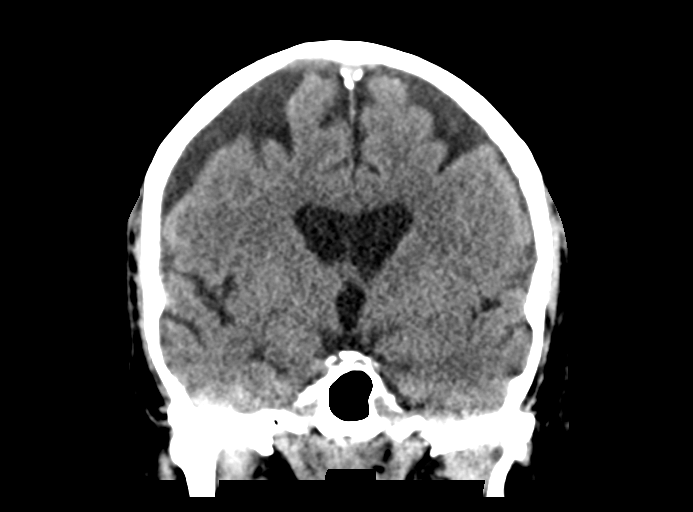

[Series 6: head without sag · sagittal · non-contrast · 0.33mm/px · 3 of 66 slices shown]
[im 22/66  brain]
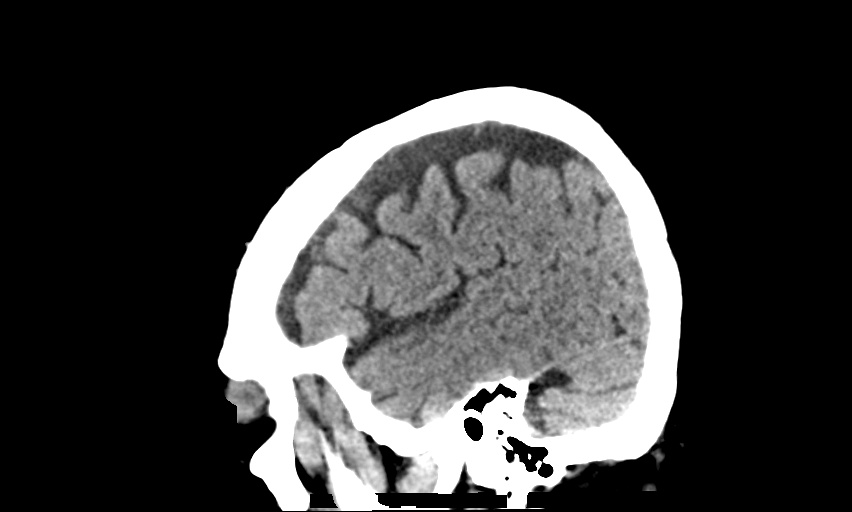
[im 33/66  brain]
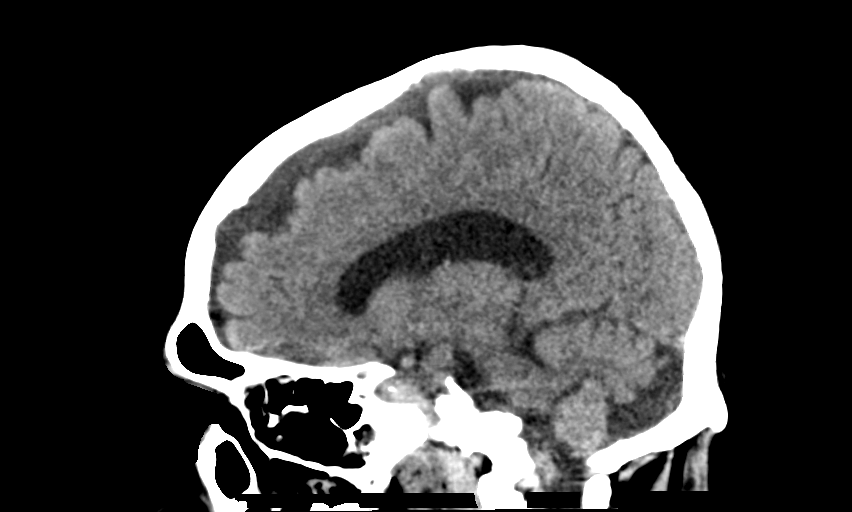
[im 44/66  brain]
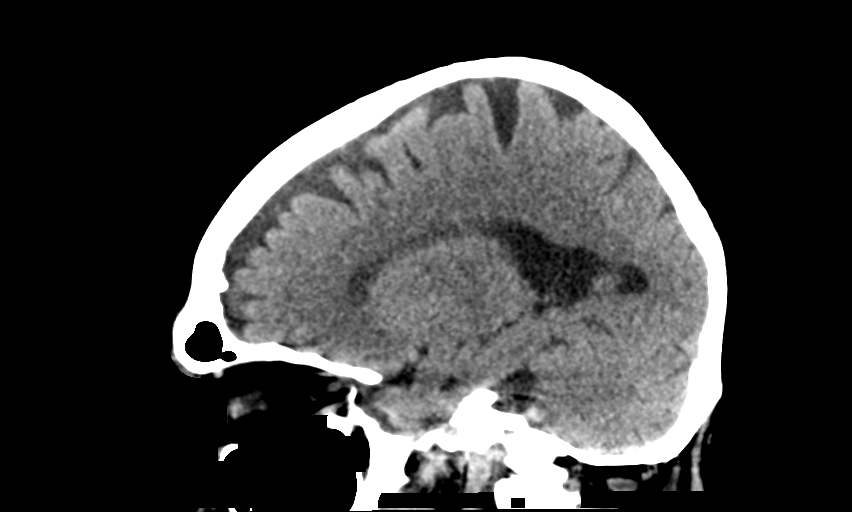

[13 of 47 positions shown; findings below may reference images not displayed]

FINDINGS: Brain: Possible chronic subdural versus hygroma across the right
parietal convexity measuring up to 6 mm in maximal thickness. No
associated mass effect. No hyperdense hemorrhage or CT evidence of
acute large territory infarct. No other extra-axial collection. No
mass effect or midline shift. No visible mass lesion within the
limitations of this unenhanced CT. Patchy areas of white matter
hypoattenuation are most compatible with chronic microvascular
angiopathy. Symmetric prominence of the ventricles, cisterns and
sulci compatible with parenchymal volume loss.

Vascular: Atherosclerotic calcification of the carotid siphons and
intradural vertebral arteries. No hyperdense vessel.

Skull: No calvarial fracture or suspicious osseous lesion. No scalp
swelling or hematoma. Benign-appearing partially calcified nodule in
the subcutaneous tissues of the left parietal scalp ([DATE]), most
likely trichilemmal cyst.

Sinuses/Orbits: Paranasal sinuses and mastoid air cells are
predominantly clear. Included orbital structures are unremarkable.

Other: None
IMPRESSION: 1. No acute intracranial abnormality. If there is persisting concern
for acute infarction, MRI is more sensitive and specific for early
features of ischemia.
2. Possible chronic subdural versus hygroma across the right
parietal convexity measuring up to 6 mm in maximal thickness. No
associated mass effect.
3. Background of chronic microvascular angiopathy and parenchymal
volume loss.

## 2022-01-05 ENCOUNTER — Ambulatory Visit: Payer: Medicare Other | Admitting: Critical Care Medicine

## 2022-01-07 ENCOUNTER — Other Ambulatory Visit: Payer: Self-pay

## 2022-01-14 ENCOUNTER — Other Ambulatory Visit: Payer: Self-pay

## 2022-01-19 ENCOUNTER — Other Ambulatory Visit: Payer: Self-pay | Admitting: Critical Care Medicine

## 2022-01-19 DIAGNOSIS — G8929 Other chronic pain: Secondary | ICD-10-CM

## 2022-01-19 NOTE — Telephone Encounter (Signed)
Refilled 12/09/2021 #30 2 rf. Requested Prescriptions  Pending Prescriptions Disp Refills  . empagliflozin (JARDIANCE) 25 MG TABS tablet [Pharmacy Med Name: Jardiance 25 mg tablet] 30 tablet 0    Sig: TAKE ONE TABLET BY MOUTH DAILY AT 9AM     Endocrinology:  Diabetes - SGLT2 Inhibitors Failed - 01/19/2022  6:08 AM      Failed - Cr in normal range and within 360 days    Creatinine  Date Value Ref Range Status  09/18/2019 119.5 20.0 - 300.0 mg/dL Final   Creatinine, Ser  Date Value Ref Range Status  12/09/2021 1.39 (H) 0.76 - 1.27 mg/dL Final         Failed - eGFR in normal range and within 360 days    GFR calc Af Amer  Date Value Ref Range Status  03/03/2020 70 >59 mL/min/1.73 Final    Comment:    **In accordance with recommendations from the NKF-ASN Task force,**   [facility] is in the process of updating its eGFR calculation to the   2021 CKD-EPI creatinine equation that estimates kidney function   without a race variable.    GFR, Estimated  Date Value Ref Range Status  08/23/2021 >60 >60 mL/min Final    Comment:    (NOTE) Calculated using the CKD-EPI Creatinine Equation (2021)    eGFR  Date Value Ref Range Status  12/09/2021 57 (L) >59 mL/min/1.73 Final         Passed - HBA1C is between 0 and 7.9 and within 180 days    HbA1c, POC (controlled diabetic range)  Date Value Ref Range Status  09/29/2021 7.1 (A) 0.0 - 7.0 % Final         Passed - Valid encounter within last 6 months    Recent Outpatient Visits          1 month ago Type 2 diabetes mellitus with hyperglycemia, with long-term current use of insulin ([place])   [place] Elsie Stain, MD   2 months ago Primary hypertension   [place], Patrick E, MD   3 months ago COPD with chronic bronchitis [facility])   [place] Elsie Stain, MD   4 months ago Type 2 diabetes mellitus with hyperglycemia, with long-term  current use of insulin [facility])   [place], Jarome Matin, RPH-CPP   4 months ago Type 2 diabetes mellitus with hyperglycemia, with long-term current use of insulin [facility])   [place], MD      Future Appointments            In 2 months Joya Gaskins Burnett Harry, MD [place]

## 2022-01-27 ENCOUNTER — Other Ambulatory Visit: Payer: Self-pay

## 2022-01-27 ENCOUNTER — Other Ambulatory Visit: Payer: Self-pay | Admitting: Critical Care Medicine

## 2022-01-27 DIAGNOSIS — G894 Chronic pain syndrome: Secondary | ICD-10-CM

## 2022-01-27 DIAGNOSIS — M545 Low back pain, unspecified: Secondary | ICD-10-CM

## 2022-01-27 MED ORDER — TRAMADOL HCL 50 MG PO TABS
50.0000 mg | ORAL_TABLET | Freq: Four times a day (QID) | ORAL | 0 refills | Status: DC | PRN
  Filled 2022-01-27: qty 120, 30d supply, fill #0

## 2022-01-27 NOTE — Telephone Encounter (Signed)
Requested medication (s) are due for refill today: yes  Requested medication (s) are on the active medication list: yes  Last refill:  12/09/21 #120/0  Future visit scheduled: yes  Notes to clinic:  Unable to refill per protocol, cannot delegate.      Requested Prescriptions  Pending Prescriptions Disp Refills   traMADol (ULTRAM) 50 MG tablet 120 tablet 0    Sig: Take 1 tablet (50 mg total) by mouth every 6 (six) hours as needed for moderate pain or severe pain.     Not Delegated - Analgesics:  Opioid Agonists Failed - 01/27/2022  2:13 PM      Failed - This refill cannot be delegated      Passed - Urine Drug Screen completed in last 360 days      Passed - Valid encounter within last 3 months    Recent Outpatient Visits           1 month ago Type 2 diabetes mellitus with hyperglycemia, with long-term current use of insulin ([place])   [place] Elsie Stain, MD   2 months ago Primary hypertension   [place], Patrick E, MD   4 months ago COPD with chronic bronchitis [facility])   [place] Elsie Stain, MD   4 months ago Type 2 diabetes mellitus with hyperglycemia, with long-term current use of insulin [facility])   [place], Jarome Matin, RPH-CPP   4 months ago Type 2 diabetes mellitus with hyperglycemia, with long-term current use of insulin [facility])   [place], MD       Future Appointments             In 2 months Joya Gaskins Burnett Harry, MD [place]

## 2022-01-28 ENCOUNTER — Other Ambulatory Visit: Payer: Self-pay

## 2022-01-29 ENCOUNTER — Other Ambulatory Visit: Payer: Self-pay

## 2022-02-02 ENCOUNTER — Other Ambulatory Visit: Payer: Self-pay | Admitting: Critical Care Medicine

## 2022-02-10 ENCOUNTER — Ambulatory Visit: Payer: Self-pay | Admitting: *Deleted

## 2022-02-10 NOTE — Telephone Encounter (Signed)
Reason for Disposition  [1] Blood glucose > 300 mg/dL (16.7 mmol/L) AND [2] two or more times in a row  Answer Assessment - Initial Assessment Questions 1. BLOOD GLUCOSE: "What is your blood glucose level?"      354 2. ONSET: "When did you check the blood glucose?"     8:30 3. USUAL RANGE: "What is your glucose level usually?" (e.g., usual fasting morning value, usual evening value)     elavted last 2 days-300 4. KETONES: "Do you check for ketones (urine or blood test strips)?" If Yes, ask: "What does the test show now?"        5. TYPE 1 or 2:  "Do you know what type of diabetes you have?"  (e.g., Type 1, Type 2, Gestational; doesn't know)      Type 2 6. INSULIN: "Do you take insulin?" "What type of insulin(s) do you use? What is the mode of delivery? (syringe, pen; injection or pump)?"      Ozempic 7. DIABETES PILLS: "Do you take any pills for your diabetes?" If Yes, ask: "Have you missed taking any pills recently?"     Jardance '25mg'$  8. OTHER SYMPTOMS: "Do you have any symptoms?" (e.g., fever, frequent urination, difficulty breathing, dizziness, weakness, vomiting)     Thirsty, frequent urination 9. PREGNANCY: "Is there any chance you are pregnant?" "When was your last menstrual period?"  Protocols used: Diabetes - High Blood Sugar-A-AH

## 2022-02-10 NOTE — Telephone Encounter (Signed)
  Chief Complaint: elevated glucose Symptoms: patient reports he has recent medication change that is not controlling his glucose level- levels have been in 300's Frequency: several days- elevated glucose Pertinent Negatives: Patient denies difficulty breathing, fever, vomiting Disposition: '[]'$ ED /'[]'$ Urgent Care (no appt availability in office) / '[x]'$ Appointment(In office/virtual)/ '[]'$  [place] [facility]/ '[]'$ Home Care/ '[]'$ Refused Recommended Disposition /'[]'$ [place] [facility]/ '[]'$  Follow-up with PCP Additional Notes: Contacted office- patient has been scheduled for tomorrow- he is requesting advice for today- he states he has insulin on hand if he needs to take it. He takes his Ozempic on Wednesday and has taken that.

## 2022-02-10 NOTE — Telephone Encounter (Signed)
Noted patient has appt for tomorrow

## 2022-02-11 ENCOUNTER — Ambulatory Visit: Payer: Medicare Other | Attending: Internal Medicine | Admitting: Internal Medicine

## 2022-02-11 ENCOUNTER — Other Ambulatory Visit: Payer: Self-pay

## 2022-02-11 ENCOUNTER — Encounter: Payer: Self-pay | Admitting: Internal Medicine

## 2022-02-11 VITALS — BP 98/64 | HR 94 | Resp 20 | Ht 69.0 in | Wt 227.0 lb

## 2022-02-11 DIAGNOSIS — I952 Hypotension due to drugs: Secondary | ICD-10-CM

## 2022-02-11 DIAGNOSIS — Z794 Long term (current) use of insulin: Secondary | ICD-10-CM | POA: Diagnosis not present

## 2022-02-11 DIAGNOSIS — Z760 Encounter for issue of repeat prescription: Secondary | ICD-10-CM

## 2022-02-11 DIAGNOSIS — E1165 Type 2 diabetes mellitus with hyperglycemia: Secondary | ICD-10-CM

## 2022-02-11 LAB — POCT URINALYSIS DIP (CLINITEK)
Bilirubin, UA: NEGATIVE
Glucose, UA: >=1000 mg/dL — AB
Leukocytes, UA: NEGATIVE
Nitrite, UA: NEGATIVE
POC PROTEIN,UA: NEGATIVE
Spec Grav, UA: 1.01 (ref 1.010–1.025)
Urobilinogen, UA: 0.2 E.U./dL
pH, UA: 5 (ref 5.0–8.0)

## 2022-02-11 LAB — POCT CBG (FASTING - GLUCOSE)-MANUAL ENTRY: Glucose Fasting, POC: 517 mg/dL — AB (ref 70–99)

## 2022-02-11 LAB — POCT GLYCOSYLATED HEMOGLOBIN (HGB A1C): HbA1c, POC (controlled diabetic range): 10.1 % — AB (ref 0.0–7.0)

## 2022-02-11 MED ORDER — ACYCLOVIR 5 % EX OINT
1.0000 | TOPICAL_OINTMENT | Freq: Four times a day (QID) | CUTANEOUS | 0 refills | Status: DC
  Filled 2022-02-11: qty 15, 15d supply, fill #0

## 2022-02-11 MED ORDER — FREESTYLE LIBRE READER DEVI
0 refills | Status: DC
  Filled 2022-02-11: qty 1, 30d supply, fill #0

## 2022-02-11 MED ORDER — MUPIROCIN 2 % EX OINT
1.0000 | TOPICAL_OINTMENT | Freq: Two times a day (BID) | CUTANEOUS | 0 refills | Status: DC
  Filled 2022-02-11: qty 22, 11d supply, fill #0

## 2022-02-11 MED ORDER — INSULIN GLARGINE 100 UNITS/ML SOLOSTAR PEN
15.0000 [IU] | PEN_INJECTOR | Freq: Every day | SUBCUTANEOUS | 1 refills | Status: DC
  Filled 2022-02-11: qty 15, 100d supply, fill #0

## 2022-02-11 MED ORDER — FREESTYLE LIBRE SENSOR SYSTEM MISC
12 refills | Status: DC
  Filled 2022-02-11: qty 2, 28d supply, fill #0

## 2022-02-11 MED ORDER — INSULIN ASPART 100 UNIT/ML IJ SOLN
6.0000 [IU] | Freq: Once | INTRAMUSCULAR | Status: AC
  Administered 2022-02-11: 6 [IU] via SUBCUTANEOUS

## 2022-02-11 NOTE — Progress Notes (Signed)
Request wheelchair  Concerns about hyperglycemia/ diabetes  Request refill on mupriocin 2% ointment 22 gm and acyclovir

## 2022-02-11 NOTE — Patient Instructions (Signed)
Restart glargine insulin 15 units daily at bedtime. I have sent a prescription to your pharmacy for a continuous glucose monitor called the [employer] device.  Please asked the pharmacist to show you how to set it up once you get the prescription filled. -Goal for blood sugars before meals is 90-130. If you start having any low blood sugar episodes with blood sugars dropping below 80, please call and let us know so that we can tell you how to adjust your insulin. Follow-up with the clinical pharmacist in 2 weeks for recheck on your diabetes.  Bring blood sugar readings with you.  Your blood pressure is a little low.  I would recommend discontinuing the Amlodipine. Check your blood pressure daily for the next several days.  Goal is to be 130/80 or lower.  Please call us and let us know if you are running higher than that.

## 2022-02-11 NOTE — Progress Notes (Signed)
Patient ID: Joshua [name], male    DOB: April 17, 1956  MRN: [id number]  CC: Diabetes Management Plan, Diabetes, and Medication Refill   Subjective: Joshua [name] is a 66 y.o. male who presents for concern of elevated blood sugars.  PCP is Dr. Joya Gaskins who is currently on leave. His concerns today include:  Patient with history of DM type II, HTN, tobacco dependence, chronic pain on tramadol, COPD, HL  DM: Results for orders placed or performed in visit on 02/11/22  Glucose (CBG), Fasting  Result Value Ref Range   Glucose Fasting, POC 517 (A) 70 - 99 mg/dL  HgB A1c  Result Value Ref Range   Hemoglobin A1C     HbA1c POC (<> result, manual entry)     HbA1c, POC (prediabetic range)     HbA1c, POC (controlled diabetic range) 10.1 (A) 0.0 - 7.0 %  POCT URINALYSIS DIP (CLINITEK)  Result Value Ref Range   Color, UA yellow yellow   Clarity, UA clear clear   Glucose, UA >=1,000 (A) negative mg/dL   Bilirubin, UA negative negative   Ketones, POC UA trace (5) (A) negative mg/dL   Spec Grav, UA 1.010 1.010 - 1.025   Blood, UA trace-intact (A) negative   pH, UA 5.0 5.0 - 8.0   POC PROTEIN,UA negative negative, trace   Urobilinogen, UA 0.2 0.2 or 1.0 E.U./dL   Nitrite, UA Negative Negative   Leukocytes, UA Negative Negative  Previous A1c was 7.1. Taking Jardiance 25 mg daily and Ozempic 1 mg weekly.  Stopped Glargine insulin several mths ago because he thought it was drying out his skin.  A1C was 7.1 so he thought he did not need the insulin any more -endorses frequent thirst and urination over the past several weeks.  Feeling weak.  Staying at home resting because of this.  He had stopped checking blood sugars but started doing so several days ago.  Blood sugars have ranged between 300-400s.  BP today a little low.  No device to check BP. No dizziness Taking Valsartan/HCTZ 320/25 mg and Norvasc 5 mg daily  Request refill on acyclovir ointment and mupirocin ointment.  The latter is  not on his current med list.   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 ([place]) 08/24/2021   Spinal stenosis of lumbar region 02/19/2020   Tobacco dependence 08/05/2019   Type 2 diabetes mellitus with hyperglycemia ([place]) 05/14/2019   Peripheral vascular disease due to secondary diabetes ([place]) 05/14/2019   Chronic low back pain with left-sided sciatica 05/14/2019   COPD with chronic bronchitis 03/27/2019   Schizoaffective disorder ([place]) 07/24/2018   Chronic pain syndrome 10/14/2016   Osteoarthritis 10/14/2016   HTN (hypertension) 10/14/2016   Migraine headache without aura 10/14/2016   Schizoaffective disorder, bipolar type ([place]) 05/10/2016   Cannabis use disorder, severe, dependence ([place]) 05/10/2016   Generalized anxiety disorder 05/04/2016     Current Outpatient Medications on File Prior to Visit  Medication Sig Dispense Refill   Accu-Chek FastClix Lancets MISC Use to check blood sugars three times per day 102 each 11   amLODipine (NORVASC) 5 MG tablet Take 1 tablet (5 mg total) by mouth daily. 90 tablet 2   blood glucose meter kit and supplies KIT Dispense based on patient and insurance preference. Use up to four times daily as directed. ICD-10 E11.65  Z79.4 1 each 0  Blood Glucose Monitoring Suppl (ACCU-CHEK GUIDE ME) w/Device KIT Use to check blood sugars up to 3 times per day. ICD-10 E11.65, Z79.4 1 kit 0   Blood Pressure Monitoring (BLOOD PRESSURE KIT) DEVI Use to measure blood pressure 1 each 0   cetirizine (ZYRTEC) 10 MG tablet Take 1 tablet (10 mg total) by mouth daily. 30 tablet 11   clotrimazole (LOTRIMIN) 1 % cream Apply to affected area 2 times daily 15 g 0   diclofenac (VOLTAREN) 50 MG EC tablet TAKE 1 TABLET BY MOUTH TWICE DAILY AS NEEDED FOR MODERATE PAIN, EAT BEFORE TAKING THIS MEDICATION 60 tablet 1   divalproex (DEPAKOTE ER) 500 MG 24 hr  tablet Take 1,500 mg by mouth at bedtime.     ezetimibe (ZETIA) 10 MG tablet Take 1 tablet (10 mg total) by mouth daily. 90 tablet 3   fluticasone-salmeterol (ADVAIR) 250-50 MCG/ACT AEPB Inhale 1 puff into the lungs 2 (two) times daily. 1 each 6   glucose blood (ACCU-CHEK GUIDE) test strip Use as instructed to check blood sugars three times per day. ICD-10 E11.65 and Z79.4 100 each 11   hydrOXYzine (ATARAX) 50 MG tablet Take 1 tablet (50 mg total) by mouth 3 (three) times daily. 90 tablet 2   INVEGA SUSTENNA injection Inject 117 mg into the muscle every 30 (thirty) days.     ipratropium-albuterol (DUONEB) 0.5-2.5 (3) MG/3ML SOLN Take 3 mLs by nebulization every 6 (six) hours as needed (shortness of breath). 120 mL 1   JARDIANCE 25 MG TABS tablet TAKE 1 TABLET(25 MG) BY MOUTH DAILY 30 tablet 2   Lancets Misc. (ACCU-CHEK FASTCLIX LANCET) KIT Use to check blood sugars 3 times per day 1 kit 11   meloxicam (MOBIC) 15 MG tablet Take 1 tablet (15 mg total) by mouth daily. 30 tablet 0   omeprazole (PRILOSEC) 20 MG capsule Take 1 capsule (20 mg total) by mouth daily. 30 capsule 3   paliperidone (INVEGA SUSTENNA) 156 MG/ML SUSY injection      promethazine-dextromethorphan (PROMETHAZINE-DM) 6.25-15 MG/5ML syrup Take 5 mLs by mouth 4 (four) times daily as needed for cough. 118 mL 0   Respiratory Therapy Supplies (FLUTTER) DEVI Use with 4 times daily 1 each 0   Semaglutide, 1 MG/DOSE, 4 MG/3ML SOPN Inject 1 mg as directed once a week. 3 mL 2   Skin Protectants, Misc. (EUCERIN) cream Apply topically as needed for dry skin. 454 g 0   Spacer/Aero-Holding Chambers (AEROCHAMBER MV) inhaler Use as instructed 1 each 0   SUMAtriptan (IMITREX) 50 MG tablet Take 1 tablet (50 mg total) by mouth every 2 (two) hours as needed for migraine. May repeat in 2 hours if headache persists or recurs. 20 tablet 1   traMADol (ULTRAM) 50 MG tablet Take 1 tablet (50 mg total) by mouth every 6 (six) hours as needed for moderate pain or  severe pain. 120 tablet 0   valsartan-hydrochlorothiazide (DIOVAN-HCT) 320-25 MG tablet Take 1 tablet by mouth daily. 90 tablet 3   VENTOLIN HFA 108 (90 Base) MCG/ACT inhaler Inhale 2 puffs into the lungs every 4 (four) hours as needed for wheezing or shortness of breath. 18 g 3   gabapentin (NEURONTIN) 300 MG capsule Take 2 capsules (600 mg total) by mouth 2 (two) times daily. 240 capsule 3   No current facility-administered medications on file prior to visit.    Allergies  Allergen Reactions   Statins Other (See Comments)    Myopathy   Haldol [Haloperidol] Other (See Comments)  Unk reaction   Metformin And Related Diarrhea and Nausea And Vomiting   Trazodone And Nefazodone Itching    Social History   Socioeconomic History   Marital status: Married    Spouse name: Not on file   Number of children: Not on file   Years of education: Not on file   Highest education level: Not on file  Occupational History   Not on file  Tobacco Use   Smoking status: Every Day    Packs/day: 2.00    Years: 50.00    Total pack years: 100.00    Types: Cigarettes   Smokeless tobacco: Never  Vaping Use   Vaping Use: Never used  Substance and Sexual Activity   Alcohol use: Not Currently   Drug use: Yes    Types: Marijuana    Comment: "have changed to hemp now" 09/17/17   Sexual activity: Yes  Other Topics Concern   Not on file  Social History Narrative   Not on file   Social Determinants of Health   Financial Resource Strain: Low Risk  (10/01/2021)   Overall Financial Resource Strain (CARDIA)    Difficulty of Paying Living Expenses: Not hard at all  Food Insecurity: No Food Insecurity (10/01/2021)   Hunger Vital Sign    Worried About Running Out of Food in the Last Year: Never true    Ran Out of Food in the Last Year: Never true  Transportation Needs: No Transportation Needs (10/01/2021)   PRAPARE - Hydrologist (Medical): No    Lack of Transportation  (Non-Medical): No  Physical Activity: Inactive (10/01/2021)   Exercise Vital Sign    Days of Exercise per Week: 0 days    Minutes of Exercise per Session: 0 min  Stress: No Stress Concern Present (10/01/2021)   [place]    Feeling of Stress : Not at all  Social Connections: Not on file  Intimate Partner Violence: Not on file    Family History  Problem Relation Age of Onset   Diabetes Mother    Cancer Mother        unsure what type   Heart disease Mother    Hypertension Mother    Dementia Father    Diabetes Brother    Dementia Maternal Grandfather    Cancer Paternal Grandmother 71       breast   Mental illness Neg Hx     Past Surgical History:  Procedure Laterality Date   APPENDECTOMY  1970    ROS: Review of Systems Negative except as stated above  PHYSICAL EXAM: BP 98/64   Pulse 94   Resp 20   Ht $R'5\' 9"'mc$  (1.753 m)   Wt 227 lb (103 kg)   SpO2 97%   BMI 33.52 kg/m   Repeat 100/80 Physical Exam  General appearance -patient is a pleasant Caucasian male who looks older than stated age.  He is in no acute distress.  He has mild resting tremor noted of the left hand. Mental status -patient is alert and oriented.  He answers questions appropriately. Mouth -oral mucosa is moist. Chest -few scattered wheezes.  Good air entry. Heart - normal rate, regular rhythm, normal S1, S2, no murmurs, rubs, clicks or gallops Extremities -no lower extremity edema.  Results for orders placed or performed in visit on 02/11/22  Glucose (CBG), Fasting  Result Value Ref Range   Glucose Fasting, POC 517 (A) 70 - 99 mg/dL  HgB A1c  Result Value Ref Range   Hemoglobin A1C     HbA1c POC (<> result, manual entry)     HbA1c, POC (prediabetic range)     HbA1c, POC (controlled diabetic range) 10.1 (A) 0.0 - 7.0 %  POCT URINALYSIS DIP (CLINITEK)  Result Value Ref Range   Color, UA yellow yellow   Clarity, UA clear clear    Glucose, UA >=1,000 (A) negative mg/dL   Bilirubin, UA negative negative   Ketones, POC UA trace (5) (A) negative mg/dL   Spec Grav, UA 1.010 1.010 - 1.025   Blood, UA trace-intact (A) negative   pH, UA 5.0 5.0 - 8.0   POC PROTEIN,UA negative negative, trace   Urobilinogen, UA 0.2 0.2 or 1.0 E.U./dL   Nitrite, UA Negative Negative   Leukocytes, UA Negative Negative       Latest Ref Rng & Units 12/09/2021    9:15 AM 08/23/2021    3:29 AM 07/23/2021   12:42 PM  CMP  Glucose 70 - 99 mg/dL 204  122  108   BUN 8 - 27 mg/dL 38  17  18   Creatinine 0.76 - 1.27 mg/dL 1.39  1.23  1.36   Sodium 134 - 144 mmol/L 139  136  136   Potassium 3.5 - 5.2 mmol/L 5.7  4.3  4.0   Chloride 96 - 106 mmol/L 97  101  105   CO2 20 - 29 mmol/L $RemoveB'19  24  22   'JcaFhSbA$ Calcium 8.6 - 10.2 mg/dL 9.8  9.7  8.7   Total Protein 6.5 - 8.1 g/dL  6.7  6.0   Total Bilirubin 0.3 - 1.2 mg/dL  0.4  0.4   Alkaline Phos 38 - 126 U/L  122  73   AST 15 - 41 U/L  26  27   ALT 0 - 44 U/L  34  31    Lipid Panel     Component Value Date/Time   CHOL 205 (H) 12/09/2021 0915   TRIG 276 (H) 12/09/2021 0915   HDL 48 12/09/2021 0915   CHOLHDL 4.3 12/09/2021 0915   CHOLHDL 3.3 09/30/2016 0651   VLDL 35 09/30/2016 0651   LDLCALC 110 (H) 12/09/2021 0915    CBC    Component Value Date/Time   WBC 10.0 08/23/2021 0329   RBC 5.08 08/23/2021 0329   HGB 15.4 08/23/2021 0329   HGB 17.2 07/09/2021 1630   HCT 46.9 08/23/2021 0329   HCT 50.8 07/09/2021 1630   PLT 216 08/23/2021 0329   PLT 237 07/09/2021 1630   MCV 92.3 08/23/2021 0329   MCV 90 07/09/2021 1630   MCH 30.3 08/23/2021 0329   MCHC 32.8 08/23/2021 0329   RDW 14.1 08/23/2021 0329   RDW 13.6 07/09/2021 1630   LYMPHSABS 1.5 07/23/2021 1242   LYMPHSABS 2.7 07/09/2021 1630   MONOABS 0.5 07/23/2021 1242   EOSABS 0.0 07/23/2021 1242   EOSABS 0.1 07/09/2021 1630   BASOSABS 0.0 07/23/2021 1242   BASOSABS 0.1 07/09/2021 1630    ASSESSMENT AND PLAN: 1. Type 2 diabetes mellitus  with hyperglycemia, with long-term current use of insulin (HCC) Uncontrolled with trace ketones in the urine. I wanted to give patient 10 units of NovoLog insulin today with fluids but he told me that he could not wait because his wife is driving and she has to be somewhere soon.  So I gave 6 units of NovoLog along with some water. I recommend restarting glargine insulin at 15  units at bedtime.  Went over signs and symptoms of hypoglycemia and how to treat.  - Goal for blood sugars before meals is 90-130 before meals.  Continue Ozempic and Jardiance. -Check BMP. -Discussed prescribing CGM.  Patient states he prefers to stick himself but is willing to give CGM a try. Follow-up with clinical pharmacist in 2 weeks for recheck. - Glucose (CBG), Fasting - HgB T0W - Basic Metabolic Panel - insulin aspart (novoLOG) injection 6 Units - insulin glargine (LANTUS) 100 unit/mL SOPN; Inject 15 Units into the skin at bedtime.  Dispense: 15 mL; Refill: 1 - POCT URINALYSIS DIP (CLINITEK) - Continuous Blood Gluc Sensor (FREESTYLE LIBRE SENSOR SYSTEM) MISC; Change sensor every two weeks  Dispense: 2 each; Refill: 12 - Continuous Blood Gluc Receiver (FREESTYLE LIBRE READER) DEVI; Use as directed  Dispense: 1 each; Refill: 0  2. Hypotension due to drugs Blood pressure running on the low side. Advised to stop amlodipine.  Continue Diovan/HCTZ.  3. Medication refill Refill sent on acyclovir and Bactroban ointment per his request.    Patient was given the opportunity to ask questions.  Patient verbalized understanding of the plan and was able to repeat key elements of the plan.   This documentation was completed using Radio producer.  Any transcriptional errors are unintentional.  Orders Placed This Encounter  Procedures   Basic Metabolic Panel   Glucose (CBG), Fasting   HgB A1c   POCT URINALYSIS DIP (CLINITEK)     Requested Prescriptions   Signed Prescriptions Disp Refills    insulin glargine (LANTUS) 100 unit/mL SOPN 15 mL 1    Sig: Inject 15 Units into the skin at bedtime.   Continuous Blood Gluc Sensor (FREESTYLE LIBRE SENSOR SYSTEM) MISC 2 each 12    Sig: Change sensor every two weeks   Continuous Blood Gluc Receiver (FREESTYLE LIBRE READER) DEVI 1 each 0    Sig: Use as directed   acyclovir ointment (ZOVIRAX) 5 % 15 g 0    Sig: Apply 1 Application topically every 6 (six) hours. Apply to lips/peri oral area   mupirocin ointment (BACTROBAN) 2 % 22 g 0    Sig: Apply 1 Application topically 2 (two) times daily.    Return for Appt with [name] in 2 wks for BS check.  Karle Plumber, MD, FACP

## 2022-02-12 ENCOUNTER — Other Ambulatory Visit: Payer: Self-pay

## 2022-02-12 ENCOUNTER — Other Ambulatory Visit: Payer: Self-pay | Admitting: Pharmacist

## 2022-02-12 LAB — BASIC METABOLIC PANEL
BUN/Creatinine Ratio: 26 — ABNORMAL HIGH (ref 10–24)
BUN: 45 mg/dL — ABNORMAL HIGH (ref 8–27)
CO2: 17 mmol/L — ABNORMAL LOW (ref 20–29)
Calcium: 9.8 mg/dL (ref 8.6–10.2)
Chloride: 89 mmol/L — ABNORMAL LOW (ref 96–106)
Creatinine, Ser: 1.75 mg/dL — ABNORMAL HIGH (ref 0.76–1.27)
Glucose: 487 mg/dL — ABNORMAL HIGH (ref 70–99)
Potassium: 4.6 mmol/L (ref 3.5–5.2)
Sodium: 129 mmol/L — ABNORMAL LOW (ref 134–144)
eGFR: 43 mL/min/{1.73_m2} — ABNORMAL LOW (ref >59)

## 2022-02-12 MED ORDER — ACYCLOVIR 5 % EX OINT
1.0000 | TOPICAL_OINTMENT | Freq: Four times a day (QID) | CUTANEOUS | 0 refills | Status: DC
  Filled 2022-02-12: qty 30, 30d supply, fill #0

## 2022-02-13 ENCOUNTER — Telehealth: Payer: Self-pay | Admitting: Internal Medicine

## 2022-02-13 DIAGNOSIS — E1165 Type 2 diabetes mellitus with hyperglycemia: Secondary | ICD-10-CM

## 2022-02-13 NOTE — Telephone Encounter (Signed)
Phone call placed to patient today to discuss lab results and to find out how his blood sugars are doing since he saw me 2 days ago. Chemistry revealed DKA with AG of 23, increase in creatinine and Bun and low sodium of 129.  Advised patient that he has increased acid buildup in the blood due to uncontrolled diabetes and I was calling to check on him to see whether we need to send him to the emergency room.  Patient tells me he is feeling better compared to 2 days ago.  Still urinating frequently but not as before.  Not feeling as tired.  Blood sugars have been coming down.  The highest has been 380 the lowest was 299 this morning before breakfast.  Advised patient: -Increase fluid intake to at least 8 glasses of water daily -Increase Lantus insulin to 20 units daily.  If after 2 days morning blood sugars are still greater than 150, he should increase to 25 units. -HOLD Jardiance for now Return to lab on Mon or Tue for repeat BMP. Patient expressed understanding.  All questions were answered.  He was able to repeat instructions given to him.

## 2022-02-16 ENCOUNTER — Ambulatory Visit: Payer: Medicare Other | Attending: Critical Care Medicine

## 2022-02-16 DIAGNOSIS — E1165 Type 2 diabetes mellitus with hyperglycemia: Secondary | ICD-10-CM

## 2022-02-17 ENCOUNTER — Other Ambulatory Visit: Payer: Self-pay

## 2022-02-17 ENCOUNTER — Telehealth: Payer: Self-pay | Admitting: Internal Medicine

## 2022-02-17 DIAGNOSIS — E1165 Type 2 diabetes mellitus with hyperglycemia: Secondary | ICD-10-CM

## 2022-02-17 LAB — BASIC METABOLIC PANEL
BUN/Creatinine Ratio: 29 — ABNORMAL HIGH (ref 10–24)
BUN: 38 mg/dL — ABNORMAL HIGH (ref 8–27)
CO2: 17 mmol/L — ABNORMAL LOW (ref 20–29)
Calcium: 9.7 mg/dL (ref 8.6–10.2)
Chloride: 94 mmol/L — ABNORMAL LOW (ref 96–106)
Creatinine, Ser: 1.3 mg/dL — ABNORMAL HIGH (ref 0.76–1.27)
Glucose: 359 mg/dL — ABNORMAL HIGH (ref 70–99)
Potassium: 5.5 mmol/L — ABNORMAL HIGH (ref 3.5–5.2)
Sodium: 129 mmol/L — ABNORMAL LOW (ref 134–144)
eGFR: 62 mL/min/{1.73_m2} (ref >59)

## 2022-02-17 MED ORDER — INSULIN LISPRO (1 UNIT DIAL) 100 UNIT/ML (KWIKPEN)
PEN_INJECTOR | SUBCUTANEOUS | 11 refills | Status: DC
  Filled 2022-02-17: qty 6, 50d supply, fill #0

## 2022-02-17 MED ORDER — INSULIN GLARGINE 100 UNITS/ML SOLOSTAR PEN
30.0000 [IU] | PEN_INJECTOR | Freq: Every day | SUBCUTANEOUS | 4 refills | Status: DC
  Filled 2022-02-17: qty 15, 50d supply, fill #0

## 2022-02-17 MED ORDER — INSULIN LISPRO 100 UNIT/ML IJ SOLN
4.0000 [IU] | Freq: Three times a day (TID) | INTRAMUSCULAR | 1 refills | Status: DC
  Filled 2022-02-17: qty 10, 84d supply, fill #0

## 2022-02-17 NOTE — Telephone Encounter (Signed)
Phone call placed to patient today to go over lab results.  Creatinine and kidney function a little better.  Potassium level mildly elevated.  Sodium level still low.  Advised to cut back on potassium rich foods like bananas, oranges and orange juice.  I think sodium is running low due to high blood sugars and possibly HCTZ. -Patient tells me he is now taking Lantus 25 units daily.  Blood sugars still running in the upper 200s to 300s.  Checking blood sugars up to 5 times a day. -Advised to increase Lantus insulin to 30 units daily.  I think we need to add some mealtime insulin.  I recommend adding Humalog 4 units with meals.   -continue to hold Jardiance Patient has a follow-up appointment with clinical pharmacist but it is on the 28th of next month.  Message sent to clinical pharmacist to see if we can get him in much sooner.

## 2022-02-18 ENCOUNTER — Other Ambulatory Visit: Payer: Self-pay | Admitting: Critical Care Medicine

## 2022-02-22 ENCOUNTER — Other Ambulatory Visit: Payer: Self-pay

## 2022-02-22 ENCOUNTER — Ambulatory Visit: Payer: Medicare Other | Attending: Internal Medicine | Admitting: Pharmacist

## 2022-02-22 ENCOUNTER — Encounter: Payer: Self-pay | Admitting: Pharmacist

## 2022-02-22 DIAGNOSIS — Z794 Long term (current) use of insulin: Secondary | ICD-10-CM | POA: Diagnosis not present

## 2022-02-22 DIAGNOSIS — E1165 Type 2 diabetes mellitus with hyperglycemia: Secondary | ICD-10-CM | POA: Diagnosis not present

## 2022-02-22 MED ORDER — SEMAGLUTIDE (2 MG/DOSE) 8 MG/3ML ~~LOC~~ SOPN
2.0000 mg | PEN_INJECTOR | SUBCUTANEOUS | 2 refills | Status: DC
  Filled 2022-02-22: qty 3, 28d supply, fill #0
  Filled 2022-03-18: qty 3, 28d supply, fill #1
  Filled 2022-04-15: qty 3, 28d supply, fill #2

## 2022-02-22 MED ORDER — INSULIN LISPRO (1 UNIT DIAL) 100 UNIT/ML (KWIKPEN)
10.0000 [IU] | PEN_INJECTOR | Freq: Three times a day (TID) | SUBCUTANEOUS | 1 refills | Status: DC
  Filled 2022-02-22 – 2022-03-10 (×3): qty 15, 50d supply, fill #0

## 2022-02-22 MED ORDER — INSULIN LISPRO (1 UNIT DIAL) 100 UNIT/ML (KWIKPEN)
8.0000 [IU] | PEN_INJECTOR | Freq: Three times a day (TID) | SUBCUTANEOUS | 1 refills | Status: DC
  Filled 2022-02-22: qty 15, 63d supply, fill #0

## 2022-02-22 MED ORDER — INSULIN GLARGINE 100 UNITS/ML SOLOSTAR PEN
30.0000 [IU] | PEN_INJECTOR | Freq: Every day | SUBCUTANEOUS | 4 refills | Status: DC
  Filled 2022-02-22 – 2022-03-29 (×2): qty 15, 50d supply, fill #0

## 2022-02-22 MED ORDER — PEN NEEDLES 32G X 4 MM MISC
3 refills | Status: DC
  Filled 2022-02-22 – 2022-04-13 (×2): qty 200, 50d supply, fill #0
  Filled 2022-05-17 – 2022-05-21 (×3): qty 200, 50d supply, fill #1
  Filled 2022-07-20: qty 200, 50d supply, fill #2
  Filled 2022-09-14: qty 200, 50d supply, fill #3
  Filled ????-??-??: fill #1

## 2022-02-22 MED ORDER — ACCU-CHEK GUIDE VI STRP
ORAL_STRIP | 11 refills | Status: DC
  Filled 2022-02-22: qty 100, 25d supply, fill #0
  Filled 2022-04-13: qty 100, 33d supply, fill #0
  Filled 2022-05-17: qty 100, 33d supply, fill #1
  Filled 2022-06-17 – 2022-06-25 (×2): qty 100, 33d supply, fill #2
  Filled 2022-07-26: qty 100, 33d supply, fill #3
  Filled 2022-09-02: qty 100, 33d supply, fill #4
  Filled 2022-10-10: qty 100, 33d supply, fill #5
  Filled 2022-11-14: qty 100, 33d supply, fill #6

## 2022-02-22 MED ORDER — INSULIN GLARGINE 100 UNITS/ML SOLOSTAR PEN
18.0000 [IU] | PEN_INJECTOR | Freq: Every day | SUBCUTANEOUS | 4 refills | Status: DC
  Filled 2022-02-22: qty 15, 83d supply, fill #0

## 2022-02-22 NOTE — Progress Notes (Signed)
S:    No chief complaint on file.  66 y.o. male who presents for diabetes evaluation, education, and management.  PMH is significant for T2DM, HTN, COPD, and schizoaffective disorder.  Patient was referred by Dr. Wynetta Emery on 02/11/2022. Patient was last seen by Primary Care Provider, Dr. Joya Gaskins, on 12/09/2021.   At last visit, CBG was elevated at 517 mg/dL and he was restarted on insulin glargine 15 units daily and Humalog 4 units TID as well as sent in CGM supplies. He was contacted 2 days later due to chemistry revealing DKA with AG 23 and instructed to increase his insulin.   Today, patient arrives in good spirits and presents with the assistance of a cain. States he stopped taking insulin due to him hearing on the Ozempic commercial that an A1c of <7% can be achieved without insulin. He has since accurately restarted insulin and continued to hold Jardiance as instructed by Dr. Wynetta Emery. Endorses he has felt better since doing this. He did not like using a CGM as it was consistently 60 mg/dL higher than finger stick. He prefers to use traditional glucometer. Has been on Ozempic 1 mg for >4 weeks and denies GI side effects.   Current diabetes medications include: Lantus (insulin glargine) 30 units daily, Humalog (insulin lispro) 4 units TID, Ozempic 1 mg weekly, Jardiance 25 mg daily (holding since 02/13/2022 due to dehydration)    Patient reports adherence to taking all medications as prescribed.   Insurance coverage: UHC Medicare and Medicaid   Patient denies hypoglycemic events. No readings <100.   Reported home fasting blood sugars: this morning 213 (ate a lot yesterday, including candy); yesterday 144, highest 300 Reported 2 hour post-meal/random blood sugars: 200s, some 300s.  Patient reports nocturia (nighttime urination). Improves, but going ~2x/night Patient reports neuropathy (nerve pain). Patient denies visual changes. Patient denies self foot exams.   O:  7 day average  blood glucose: 275 mg/dL 14 day average: 317 mg/dL   Lab Results  Component Value Date   HGBA1C 10.1 (A) 02/11/2022   Lipid Panel     Component Value Date/Time   CHOL 205 (H) 12/09/2021 0915   TRIG 276 (H) 12/09/2021 0915   HDL 48 12/09/2021 0915   CHOLHDL 4.3 12/09/2021 0915   CHOLHDL 3.3 09/30/2016 0651   VLDL 35 09/30/2016 0651   LDLCALC 110 (H) 12/09/2021 0915    Clinical Atherosclerotic Cardiovascular Disease (ASCVD): No  The 10-year ASCVD risk score (Arnett DK, et al., 2019) is: 23.5%   Values used to calculate the score:     Age: 53 years     Sex: Male     Is Non-Hispanic African American: No     Diabetic: Yes     Tobacco smoker: Yes     Systolic Blood Pressure: 98 mmHg     Is BP treated: Yes     HDL Cholesterol: 48 mg/dL     Total Cholesterol: 205 mg/dL   A/P: Diabetes longstanding currently uncontrolled based on A1c, however control is improving. Patient is able to verbalize appropriate hypoglycemia management plan. Medication adherence appears appropriate. Control is suboptimal due to needing therapy intensification. -Continued basal insulin Lantus (insulin glargine) 30 units daily.  -Increased dose of rapid insulin Humalog (insulin lispro) to 10 units before meals.  -Increased dose of GLP-1 Ozempic (semaglutide) to 2 mg weekly.  -Continue to hold SGLT2-I Jardiance (empagliflozin). -Patient educated on purpose, proper use, and potential adverse effects of insulin.  -Extensively discussed pathophysiology of  diabetes, recommended lifestyle interventions, dietary effects on blood sugar control.  -Removed CGM from med list as patient prefers to use glucometer.  -Counseled on s/sx of and management of hypoglycemia.  -Next A1c anticipated January 2024.   Written patient instructions provided. Patient verbalized understanding of treatment plan.  Total time in face to face counseling 25 minutes.    Follow-up:  Pharmacist 1 month. PCP clinic visit in December 2023.    Joshua [name], Pharm.D. PGY-2 Ambulatory Care Pharmacy Resident 02/22/2022 11:24 AM

## 2022-02-23 ENCOUNTER — Other Ambulatory Visit: Payer: Self-pay

## 2022-03-01 ENCOUNTER — Other Ambulatory Visit: Payer: Self-pay | Admitting: Critical Care Medicine

## 2022-03-01 DIAGNOSIS — M545 Low back pain, unspecified: Secondary | ICD-10-CM

## 2022-03-01 DIAGNOSIS — G894 Chronic pain syndrome: Secondary | ICD-10-CM

## 2022-03-01 NOTE — Telephone Encounter (Signed)
Requested medication (s) are due for refill today -yes  Requested medication (s) are on the active medication list -yes  Future visit scheduled -no  Last refill: 01/27/22 #120  Notes to clinic: non delegated Rx  Requested Prescriptions  Pending Prescriptions Disp Refills   traMADol (ULTRAM) 50 MG tablet 120 tablet 0    Sig: Take 1 tablet (50 mg total) by mouth every 6 (six) hours as needed for moderate pain or severe pain.     Not Delegated - Analgesics:  Opioid Agonists Failed - 03/01/2022  8:47 AM      Failed - This refill cannot be delegated      Passed - Urine Drug Screen completed in last 360 days      Passed - Valid encounter within last 3 months    Recent Outpatient Visits           1 week ago Type 2 diabetes mellitus with hyperglycemia, with long-term current use of insulin [facility])   [place], Annie Main L, RPH-CPP   2 weeks ago Type 2 diabetes mellitus with hyperglycemia, with long-term current use of insulin ([place])   [place] Karle Plumber B, MD   2 months ago Type 2 diabetes mellitus with hyperglycemia, with long-term current use of insulin [facility])   [place] Elsie Stain, MD   3 months ago Primary hypertension   [place], Patrick E, MD   5 months ago COPD with chronic bronchitis [facility])   [place] Elsie Stain, MD       Future Appointments             In 1 month Tresa Endo, [place]   In 1 month Elsie Stain, MD [place]               Requested Prescriptions  Pending Prescriptions Disp Refills   traMADol (ULTRAM) 50 MG tablet 120 tablet 0    Sig: Take 1 tablet (50 mg total) by mouth every 6 (six) hours as needed for moderate pain or severe pain.     Not Delegated - Analgesics:   Opioid Agonists Failed - 03/01/2022  8:47 AM      Failed - This refill cannot be delegated      Passed - Urine Drug Screen completed in last 360 days      Passed - Valid encounter within last 3 months    Recent Outpatient Visits           1 week ago Type 2 diabetes mellitus with hyperglycemia, with long-term current use of insulin [facility])   [place], Annie Main L, RPH-CPP   2 weeks ago Type 2 diabetes mellitus with hyperglycemia, with long-term current use of insulin [facility])   [place] Karle Plumber B, MD   2 months ago Type 2 diabetes mellitus with hyperglycemia, with long-term current use of insulin [facility])   [place] Elsie Stain, MD   3 months ago Primary hypertension   [place], Patrick E, MD   5 months ago COPD with chronic bronchitis [name])   [place] Elsie Stain, MD       Future Appointments  In 1 month Daisy Blossom, Jarome Matin, [place]   In 1 month Joya Gaskins, Burnett Harry, MD [place]

## 2022-03-02 ENCOUNTER — Other Ambulatory Visit: Payer: Self-pay

## 2022-03-02 MED ORDER — TRAMADOL HCL 50 MG PO TABS
50.0000 mg | ORAL_TABLET | Freq: Four times a day (QID) | ORAL | 0 refills | Status: DC | PRN
  Filled 2022-03-02: qty 120, 30d supply, fill #0

## 2022-03-02 NOTE — Telephone Encounter (Signed)
Caller checking on the status of medication refill. Informed please allow 48 to 72 hour turn around time. Patient will be out after today and would like script sent to     [facility] [address] [place] [address]  Phone: [phone] Fax: [phone]  Hours: M-F 7:30a-6:00p

## 2022-03-03 ENCOUNTER — Other Ambulatory Visit: Payer: Self-pay

## 2022-03-10 ENCOUNTER — Other Ambulatory Visit: Payer: Self-pay

## 2022-03-11 ENCOUNTER — Other Ambulatory Visit: Payer: Self-pay

## 2022-03-12 ENCOUNTER — Other Ambulatory Visit: Payer: Self-pay

## 2022-03-16 ENCOUNTER — Other Ambulatory Visit: Payer: Self-pay

## 2022-03-19 ENCOUNTER — Other Ambulatory Visit: Payer: Self-pay

## 2022-03-22 ENCOUNTER — Other Ambulatory Visit: Payer: Self-pay

## 2022-03-23 ENCOUNTER — Ambulatory Visit: Payer: Self-pay | Admitting: Pharmacist

## 2022-03-28 ENCOUNTER — Other Ambulatory Visit: Payer: Self-pay | Admitting: Critical Care Medicine

## 2022-03-28 DIAGNOSIS — G8929 Other chronic pain: Secondary | ICD-10-CM

## 2022-03-29 ENCOUNTER — Other Ambulatory Visit: Payer: Self-pay

## 2022-03-29 ENCOUNTER — Other Ambulatory Visit: Payer: Self-pay | Admitting: Family Medicine

## 2022-03-29 ENCOUNTER — Other Ambulatory Visit: Payer: Self-pay | Admitting: Critical Care Medicine

## 2022-03-29 DIAGNOSIS — G894 Chronic pain syndrome: Secondary | ICD-10-CM

## 2022-03-29 DIAGNOSIS — M545 Low back pain, unspecified: Secondary | ICD-10-CM

## 2022-03-29 MED ORDER — SUMATRIPTAN SUCCINATE 50 MG PO TABS
50.0000 mg | ORAL_TABLET | ORAL | 1 refills | Status: DC | PRN
  Filled 2022-03-29: qty 9, 30d supply, fill #0

## 2022-03-29 NOTE — Telephone Encounter (Signed)
Refilled 12/09/2021 #240  3 rf. Requested Prescriptions  Pending Prescriptions Disp Refills   gabapentin (NEURONTIN) 300 MG capsule [Pharmacy Med Name: GABAPENTIN '300MG'$  CAPSULES] 240 capsule 3    Sig: TAKE 2 CAPSULES(600 MG) BY MOUTH TWICE DAILY     Neurology: Anticonvulsants - gabapentin Failed - 03/28/2022  3:31 AM      Failed - Cr in normal range and within 360 days    Creatinine  Date Value Ref Range Status  09/18/2019 119.5 20.0 - 300.0 mg/dL Final   Creatinine, Ser  Date Value Ref Range Status  02/16/2022 1.30 (H) 0.76 - 1.27 mg/dL Final         Passed - Completed PHQ-2 or PHQ-9 in the last 360 days      Passed - Valid encounter within last 12 months    Recent Outpatient Visits           1 month ago Type 2 diabetes mellitus with hyperglycemia, with long-term current use of insulin ([place])   [place], Annie Main L, RPH-CPP   1 month ago Type 2 diabetes mellitus with hyperglycemia, with long-term current use of insulin ([place])   [place] Karle Plumber B, MD   3 months ago Type 2 diabetes mellitus with hyperglycemia, with long-term current use of insulin [facility])   [place], MD   4 months ago Primary hypertension   [place] Elsie Stain, MD   6 months ago COPD with chronic bronchitis [facility])   [place] Elsie Stain, MD       Future Appointments             In 3 days Daisy Blossom, Jarome Matin, [place]   In 2 weeks Joya Gaskins Burnett Harry, MD [place]

## 2022-03-30 ENCOUNTER — Other Ambulatory Visit: Payer: Self-pay

## 2022-03-30 MED ORDER — TRAMADOL HCL 50 MG PO TABS
50.0000 mg | ORAL_TABLET | Freq: Four times a day (QID) | ORAL | 0 refills | Status: DC | PRN
  Filled 2022-03-31: qty 120, 30d supply, fill #0

## 2022-03-31 ENCOUNTER — Other Ambulatory Visit: Payer: Self-pay

## 2022-04-01 ENCOUNTER — Other Ambulatory Visit: Payer: Self-pay

## 2022-04-01 ENCOUNTER — Ambulatory Visit: Payer: Medicare Other | Attending: Family Medicine | Admitting: Pharmacist

## 2022-04-01 DIAGNOSIS — E1165 Type 2 diabetes mellitus with hyperglycemia: Secondary | ICD-10-CM | POA: Diagnosis not present

## 2022-04-01 DIAGNOSIS — Z794 Long term (current) use of insulin: Secondary | ICD-10-CM | POA: Diagnosis not present

## 2022-04-01 MED ORDER — INSULIN GLARGINE 100 UNITS/ML SOLOSTAR PEN
38.0000 [IU] | PEN_INJECTOR | Freq: Every day | SUBCUTANEOUS | 4 refills | Status: DC
  Filled 2022-04-01: qty 15, 39d supply, fill #0
  Filled 2022-05-07: qty 15, 39d supply, fill #1
  Filled 2022-06-17: qty 15, 39d supply, fill #2
  Filled 2022-07-26: qty 15, 39d supply, fill #3

## 2022-04-01 NOTE — Progress Notes (Signed)
    S:    No chief complaint on file.  66 y.o. male who presents for diabetes evaluation, education, and management.  PMH is significant for T2DM, HTN, COPD, and schizoaffective disorder.  Patient was referred by Dr. Wynetta Emery on 02/11/2022. We saw him on 02/22/2022. We optimized his Ozempic dose and increased his insulin as well.   Today, patient arrives in good spirits and presents with the assistance of a cane. He is doing well with adherence overall. Still not taking Jardiance. He is taking his insulin as prescribed as well as Ozempic. He does skip Humalog if he doesn't eat. Tells me he feels better and does not have any complaints today.   Current diabetes medications include: Lantus 30 units daily, Humalog10 units TID (takes 1-3x times depending on what he eats), Ozempic 2 mg weekly  Insurance coverage: UHC Medicare and Medicaid  Patient denies hypoglycemic events. No readings <100.   Patient reports nocturia (nighttime urination). Improved, but still going ~2x/night Patient reports neuropathy (nerve pain). Patient denies visual changes. Patient denies self foot exams.   O:  Brings his meter for review. His new averages are listed first with avgs at last visit in parentheses.  7 day average blood glucose: 207 (275 mg/dL) 14 day average: 210 (317 mg/dL) 30 day avg: 192 mg/dL.  Range: 131-290. Spends most of his time in the 150+.  Lab Results  Component Value Date   HGBA1C 10.1 (A) 02/11/2022   Lipid Panel     Component Value Date/Time   CHOL 205 (H) 12/09/2021 0915   TRIG 276 (H) 12/09/2021 0915   HDL 48 12/09/2021 0915   CHOLHDL 4.3 12/09/2021 0915   CHOLHDL 3.3 09/30/2016 0651   VLDL 35 09/30/2016 0651   LDLCALC 110 (H) 12/09/2021 0915    Clinical Atherosclerotic Cardiovascular Disease (ASCVD): No  The 10-year ASCVD risk score (Arnett DK, et al., 2019) is: 23.5%   Values used to calculate the score:     Age: 89 years     Sex: Male     Is Non-Hispanic African  American: No     Diabetic: Yes     Tobacco smoker: Yes     Systolic Blood Pressure: 98 mmHg     Is BP treated: Yes     HDL Cholesterol: 48 mg/dL     Total Cholesterol: 205 mg/dL   A/P: Diabetes longstanding currently uncontrolled based on A1c, however, home glycemic control is improving. Patient is able to verbalize appropriate hypoglycemia management plan. Medication adherence appears appropriate. Will adjust basal insulin as his eating habits are inconsistent.  -Increase basal insulin Lantus to 38 units daily.  -Continue Humalog (insulin lispro) to 10 units before meals. Only take before eating a meal. -Continue Ozempic (semaglutide) to 2 mg weekly.  -Patient educated on purpose, proper use, and potential adverse effects of insulin.  -Extensively discussed pathophysiology of diabetes, recommended lifestyle interventions, dietary effects on blood sugar control.  -Removed CGM from med list as patient prefers to use glucometer.  -Counseled on s/sx of and management of hypoglycemia.  -Next A1c anticipated January 2024.   Written patient instructions provided. Patient verbalized understanding of treatment plan.  Total time in face to face counseling 30 minutes.    Follow-up: PCP clinic visit 04/13/2022.  Benard Halsted, PharmD, [place], [place] [phone]

## 2022-04-02 ENCOUNTER — Other Ambulatory Visit: Payer: Self-pay

## 2022-04-13 ENCOUNTER — Other Ambulatory Visit: Payer: Self-pay | Admitting: Pharmacist

## 2022-04-13 ENCOUNTER — Ambulatory Visit: Payer: Medicare Other | Admitting: Critical Care Medicine

## 2022-04-13 ENCOUNTER — Other Ambulatory Visit: Payer: Self-pay

## 2022-04-13 MED ORDER — ALBUTEROL SULFATE HFA 108 (90 BASE) MCG/ACT IN AERS
2.0000 | INHALATION_SPRAY | RESPIRATORY_TRACT | 3 refills | Status: DC | PRN
  Filled 2022-04-13: qty 8.5, 25d supply, fill #0
  Filled 2022-06-17: qty 8.5, 25d supply, fill #1
  Filled 2022-11-04: qty 8.5, 17d supply, fill #2
  Filled 2023-03-16: qty 6.7, 17d supply, fill #3

## 2022-04-14 ENCOUNTER — Other Ambulatory Visit: Payer: Self-pay

## 2022-04-28 ENCOUNTER — Other Ambulatory Visit: Payer: Self-pay | Admitting: Critical Care Medicine

## 2022-04-28 DIAGNOSIS — M545 Low back pain, unspecified: Secondary | ICD-10-CM

## 2022-04-28 DIAGNOSIS — G894 Chronic pain syndrome: Secondary | ICD-10-CM

## 2022-04-28 MED ORDER — TRAMADOL HCL 50 MG PO TABS
50.0000 mg | ORAL_TABLET | Freq: Four times a day (QID) | ORAL | 0 refills | Status: DC | PRN
  Filled 2022-04-28: qty 120, 30d supply, fill #0

## 2022-04-29 ENCOUNTER — Other Ambulatory Visit: Payer: Self-pay

## 2022-04-30 ENCOUNTER — Other Ambulatory Visit: Payer: Self-pay

## 2022-05-03 ENCOUNTER — Other Ambulatory Visit: Payer: Self-pay

## 2022-05-03 ENCOUNTER — Encounter: Payer: Self-pay | Admitting: Physician Assistant

## 2022-05-03 ENCOUNTER — Ambulatory Visit: Payer: Medicare Other | Attending: Critical Care Medicine | Admitting: Physician Assistant

## 2022-05-03 VITALS — BP 116/71 | HR 87 | Wt 247.6 lb

## 2022-05-03 DIAGNOSIS — Z794 Long term (current) use of insulin: Secondary | ICD-10-CM

## 2022-05-03 DIAGNOSIS — G43009 Migraine without aura, not intractable, without status migrainosus: Secondary | ICD-10-CM | POA: Diagnosis not present

## 2022-05-03 DIAGNOSIS — G629 Polyneuropathy, unspecified: Secondary | ICD-10-CM | POA: Diagnosis not present

## 2022-05-03 DIAGNOSIS — M5442 Lumbago with sciatica, left side: Secondary | ICD-10-CM

## 2022-05-03 DIAGNOSIS — E1165 Type 2 diabetes mellitus with hyperglycemia: Secondary | ICD-10-CM

## 2022-05-03 DIAGNOSIS — J4489 Other specified chronic obstructive pulmonary disease: Secondary | ICD-10-CM

## 2022-05-03 DIAGNOSIS — G8929 Other chronic pain: Secondary | ICD-10-CM

## 2022-05-03 LAB — POCT GLYCOSYLATED HEMOGLOBIN (HGB A1C): HbA1c, POC (controlled diabetic range): 9.3 % — AB (ref 0.0–7.0)

## 2022-05-03 LAB — GLUCOSE, POCT (MANUAL RESULT ENTRY): POC Glucose: 218 mg/dl — AB (ref 70–99)

## 2022-05-03 MED ORDER — INSULIN LISPRO (1 UNIT DIAL) 100 UNIT/ML (KWIKPEN)
10.0000 [IU] | PEN_INJECTOR | Freq: Three times a day (TID) | SUBCUTANEOUS | 1 refills | Status: DC
  Filled 2022-05-03: qty 15, 50d supply, fill #0
  Filled 2022-06-25: qty 15, 50d supply, fill #1

## 2022-05-03 MED ORDER — SEMAGLUTIDE (2 MG/DOSE) 8 MG/3ML ~~LOC~~ SOPN
3.0000 mg | PEN_INJECTOR | SUBCUTANEOUS | 2 refills | Status: DC
  Filled 2022-05-03 – 2022-05-06 (×2): qty 9, 84d supply, fill #0

## 2022-05-03 MED ORDER — ACYCLOVIR 5 % EX OINT
1.0000 | TOPICAL_OINTMENT | Freq: Four times a day (QID) | CUTANEOUS | 0 refills | Status: DC
  Filled 2022-05-03: qty 30, 30d supply, fill #0

## 2022-05-03 MED ORDER — IPRATROPIUM-ALBUTEROL 0.5-2.5 (3) MG/3ML IN SOLN
3.0000 mL | Freq: Four times a day (QID) | RESPIRATORY_TRACT | 1 refills | Status: DC | PRN
  Filled 2022-05-03: qty 120, 10d supply, fill #0
  Filled 2022-05-04: qty 90, 8d supply, fill #0

## 2022-05-03 MED ORDER — MELOXICAM 15 MG PO TABS
15.0000 mg | ORAL_TABLET | Freq: Every day | ORAL | 2 refills | Status: DC
  Filled 2022-05-03: qty 30, 30d supply, fill #0

## 2022-05-03 MED ORDER — GABAPENTIN 300 MG PO CAPS
600.0000 mg | ORAL_CAPSULE | Freq: Three times a day (TID) | ORAL | 3 refills | Status: DC
  Filled 2022-05-03: qty 180, 30d supply, fill #0
  Filled 2022-06-16: qty 180, 30d supply, fill #1
  Filled 2022-07-15: qty 180, 30d supply, fill #2

## 2022-05-03 MED ORDER — SUMATRIPTAN SUCCINATE 50 MG PO TABS
50.0000 mg | ORAL_TABLET | ORAL | 2 refills | Status: DC | PRN
  Filled 2022-05-03: qty 10, 30d supply, fill #0
  Filled 2022-06-01: qty 10, 30d supply, fill #1
  Filled 2022-07-15: qty 10, 30d supply, fill #2

## 2022-05-03 NOTE — Patient Instructions (Signed)
Drink 64 ounces water daily

## 2022-05-03 NOTE — Progress Notes (Signed)
Patient ID: Joshua [name], male   DOB: [birth year], 67 y.o.   MRN: [id number]   Joshua [name], is a 67 y.o. male  GYK:[id number]  [id number]:[id number]  DOB - 01-20-1956  Chief Complaint  Patient presents with   Diabetes       Subjective:   Joshua [name] is a 67 y.o. male here today for med RF.    He wants to increase the dose on his gabapentin because his nerve pain is not controlled.    He has been taking the '2mg'$  dose of ozempic for about 1 month and A1C is down from 10.1 to 9.3.  He is compliant with his meds.  Needs RF on several meds.    No problems updated.  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 Authorizing Provider  Accu-Chek FastClix Lancets MISC Use to check blood sugars three times per day 06/17/21  Yes Elsie Stain, MD  albuterol [facility] HFA) 108 (90 Base) MCG/ACT inhaler Inhale 2 puffs into the lungs every 4 (four) hours as needed for wheezing or shortness of breath. 04/13/22  Yes Elsie Stain, MD  amLODipine (NORVASC) 5 MG tablet Take 1 tablet (5 mg total) by mouth daily. 11/02/21  Yes Elsie Stain, MD  blood glucose meter kit and supplies KIT Dispense based on patient and insurance preference. Use up to four times daily  as directed. ICD-10 E11.65  Z79.4 07/20/19  Yes [name], Cammie, MD  Blood Glucose Monitoring Suppl (ACCU-CHEK GUIDE ME) w/Device KIT Use to check blood sugars up to 3 times per day. ICD-10 E11.65, Z79.4 03/03/20  Yes [name], Cammie, MD  Blood Pressure Monitoring (BLOOD PRESSURE KIT) DEVI Use to measure blood pressure 09/29/21  Yes Elsie Stain, MD  cetirizine (ZYRTEC) 10 MG tablet Take 1 tablet (10 mg total) by mouth daily. 01/19/21  Yes Elsie Stain, MD  clotrimazole (LOTRIMIN) 1 % cream Apply to affected area 2 times daily 07/13/21  Yes [name], Joshua S, PA  diclofenac (VOLTAREN) 50 MG EC tablet TAKE 1 TABLET BY MOUTH TWICE DAILY AS NEEDED FOR MODERATE PAIN, EAT BEFORE TAKING THIS MEDICATION 01/19/22  Yes Elsie Stain, MD  divalproex (DEPAKOTE ER) 500 MG 24 hr tablet Take 1,500 mg by mouth at bedtime. 09/23/21  Yes [provider]  fluticasone-salmeterol (ADVAIR) 250-50 MCG/ACT AEPB Inhale 1 puff into the lungs 2 (two) times daily. 11/02/21  Yes Elsie Stain, MD  glucose blood (ACCU-CHEK GUIDE) test strip Use as instructed to check blood sugars three times per day. 02/22/22  Yes Elsie Stain, MD  hydrOXYzine (ATARAX) 50 MG tablet Take 1 tablet (50 mg total) by mouth 3 (three) times daily. 08/10/21  Yes Elsie Stain,  MD  insulin glargine (LANTUS) 100 unit/mL SOPN Inject 38 Units into the skin at bedtime. 04/01/22  Yes Elsie Stain, MD  Insulin Pen Needle (PEN NEEDLES) 32G X 4 MM MISC Use to inject insulin four times daily. Also, Ozempic once weekly. 02/22/22  Yes Elsie Stain, MD  INVEGA SUSTENNA injection Inject 117 mg into the muscle every 30 (thirty) days. 11/24/21  Yes [provider]  Lancets Misc. (ACCU-CHEK FASTCLIX LANCET) KIT Use to check blood sugars 3 times per day 03/03/20  Yes [name], Cammie, MD  mupirocin ointment (BACTROBAN) 2 % Apply 1 Application topically 2 (two) times daily. 02/11/22  Yes Joshua Pier, MD  omeprazole (PRILOSEC) 20 MG  capsule Take 1 capsule (20 mg total) by mouth daily. 12/09/21  Yes Elsie Stain, MD  paliperidone (INVEGA SUSTENNA) 156 MG/ML SUSY injection    Yes [provider]  Respiratory Therapy Supplies (FLUTTER) DEVI Use with 4 times daily 03/27/19  Yes Elsie Stain, MD  Skin Protectants, Misc. (EUCERIN) cream Apply topically as needed for dry skin. 07/09/21  Yes Feliberto Gottron, FNP  Spacer/Aero-Holding Chambers (AEROCHAMBER MV) inhaler Use as instructed 03/27/19  Yes Elsie Stain, MD  traMADol (ULTRAM) 50 MG tablet Take 1 tablet (50 mg total) by mouth every 6 (six) hours as needed for moderate pain or severe pain. 04/28/22  Yes Elsie Stain, MD  valsartan-hydrochlorothiazide (DIOVAN-HCT) 320-25 MG tablet Take 1 tablet by mouth daily. 12/09/21  Yes Elsie Stain, MD  acyclovir ointment (ZOVIRAX) 5 % Apply onto affected area of lips /peri oral area topically every 6 (six) hours. 05/03/22   Joshua Donovan, PA-C  gabapentin (NEURONTIN) 300 MG capsule Take 2 capsules (600 mg total) by mouth 3 (three) times daily. 05/03/22 07/02/22  Joshua Donovan, PA-C  insulin lispro (HUMALOG KWIKPEN) 100 UNIT/ML KwikPen Inject 10 Units into the skin 3 (three) times daily. 05/03/22   Joshua Donovan, PA-C  ipratropium-albuterol (DUONEB) 0.5-2.5 (3) MG/3ML SOLN Take 3 mLs by nebulization every 6 (six) hours as needed (shortness of breath). 05/03/22   Joshua Donovan, PA-C  meloxicam (MOBIC) 15 MG tablet Take 1 tablet (15 mg total) by mouth daily. 05/03/22   Joshua Donovan, PA-C  Semaglutide, 2 MG/DOSE, 8 MG/3ML SOPN Inject 3 mg as directed once a week. 05/03/22   Joshua Donovan, PA-C  SUMAtriptan (IMITREX) 50 MG tablet Take 1 tablet (50 mg total) by mouth every 2 (two) hours as needed for migraine. May repeat in 2 hours if headache persists or recurs. 05/03/22   Joshua [name], Joshua Bucy, PA-C    ROS: Neg HEENT Neg resp Neg cardiac Neg GI Neg GU Neg psych Neg neuro  Objective:   Vitals:   05/03/22  1439  BP: 116/71  Pulse: 87  SpO2: 94%  Weight: 247 lb 9.6 oz (112.3 kg)   Exam General appearance : Awake, alert, not in any distress. Speech Clear. Not toxic looking HEENT: Atraumatic and Normocephalic Neck: Supple, no JVD. No cervical lymphadenopathy.  Chest: Good air entry bilaterally, CTAB.  No rales/rhonchi/wheezing CVS: S1 S2 regular, no murmurs.  Extremities: B/L Lower Ext shows no edema, both legs are warm to touch Neurology: Awake alert, and oriented X 3, CN II-XII intact, Non focal Skin: No Rash  Data Review Lab Results  Component Value Date   HGBA1C 9.3 (A) 05/03/2022   HGBA1C 10.1 (A) 02/11/2022   HGBA1C 7.1 (A) 09/29/2021    Assessment & Plan   1.  Type 2 diabetes mellitus with hyperglycemia, with long-term current use of insulin (HCC) Improved-only on higher dose of ozempic for 4 weeks-hopefully will continue improving - Glucose (CBG) - HgB A1c - Semaglutide, 2 MG/DOSE, 8 MG/3ML SOPN; Inject 3 mg as directed once a week.  Dispense: 9 mL; Refill: 2 - insulin lispro (HUMALOG KWIKPEN) 100 UNIT/ML KwikPen; Inject 10 Units into the skin 3 (three) times daily.  Dispense: 15 mL; Refill: 1 - Comprehensive metabolic panel  2. Chronic low back pain with left-sided sciatica, unspecified back pain laterality - meloxicam (MOBIC) 15 MG tablet; Take 1 tablet (15 mg total) by mouth daily.  Dispense: 30 tablet; Refill: 2 - gabapentin (NEURONTIN) 300 MG capsule; Take 2 capsules (600 mg total) by mouth 3 (three) times daily.  Dispense: 180 capsule; Refill: 3  3. Neuropathy - gabapentin (NEURONTIN) 300 MG capsule; Take 2 capsules (600 mg total) by mouth 3 (three) times daily.  Dispense: 180 capsule; Refill: 3  4. Migraine without aura and without status migrainosus, not intractable - SUMAtriptan (IMITREX) 50 MG tablet; Take 1 tablet (50 mg total) by mouth every 2 (two) hours as needed for migraine. May repeat in 2 hours if headache persists or recurs.  Dispense: 10 tablet; Refill:  2  5. COPD with chronic bronchitis - ipratropium-albuterol (DUONEB) 0.5-2.5 (3) MG/3ML SOLN; Take 3 mLs by nebulization every 6 (six) hours as needed (shortness of breath).  Dispense: 120 mL; Refill: 1    Return in about 3 months (around 08/02/2022) for 3 months with PCP for chronic conditions.  The patient was given clear instructions to go to ER or return to medical center if symptoms don't improve, worsen or new problems develop. The patient verbalized understanding. The patient was told to call to get lab results if they haven't heard anything in the next week.      Freeman Caldron, PA-C [facility] [place], [place]   05/03/2022, 3:01 PM

## 2022-05-04 ENCOUNTER — Other Ambulatory Visit: Payer: Self-pay

## 2022-05-04 LAB — COMPREHENSIVE METABOLIC PANEL
ALT: 45 IU/L — ABNORMAL HIGH (ref 0–44)
AST: 27 IU/L (ref 0–40)
Albumin/Globulin Ratio: 2.1 (ref 1.2–2.2)
Albumin: 4.4 g/dL (ref 3.9–4.9)
Alkaline Phosphatase: 131 IU/L — ABNORMAL HIGH (ref 44–121)
BUN/Creatinine Ratio: 19 (ref 10–24)
BUN: 26 mg/dL (ref 8–27)
Bilirubin Total: 0.2 mg/dL (ref 0.0–1.2)
CO2: 22 mmol/L (ref 20–29)
Calcium: 9.7 mg/dL (ref 8.6–10.2)
Chloride: 94 mmol/L — ABNORMAL LOW (ref 96–106)
Creatinine, Ser: 1.34 mg/dL — ABNORMAL HIGH (ref 0.76–1.27)
Globulin, Total: 2.1 g/dL (ref 1.5–4.5)
Glucose: 258 mg/dL — ABNORMAL HIGH (ref 70–99)
Potassium: 4.7 mmol/L (ref 3.5–5.2)
Sodium: 133 mmol/L — ABNORMAL LOW (ref 134–144)
Total Protein: 6.5 g/dL (ref 6.0–8.5)
eGFR: 59 mL/min/{1.73_m2} — ABNORMAL LOW (ref >59)

## 2022-05-05 ENCOUNTER — Other Ambulatory Visit: Payer: Self-pay

## 2022-05-06 ENCOUNTER — Other Ambulatory Visit: Payer: Self-pay

## 2022-05-07 ENCOUNTER — Other Ambulatory Visit: Payer: Self-pay

## 2022-05-18 ENCOUNTER — Other Ambulatory Visit: Payer: Self-pay

## 2022-05-20 ENCOUNTER — Other Ambulatory Visit: Payer: Self-pay

## 2022-05-20 ENCOUNTER — Other Ambulatory Visit: Payer: Self-pay | Admitting: Critical Care Medicine

## 2022-05-20 DIAGNOSIS — G8929 Other chronic pain: Secondary | ICD-10-CM

## 2022-05-21 ENCOUNTER — Other Ambulatory Visit: Payer: Self-pay

## 2022-05-21 NOTE — Telephone Encounter (Signed)
Duplicate request, initial request already routed to provider for approval, will deny this request.  Requested Prescriptions  Pending Prescriptions Disp Refills   diclofenac (VOLTAREN) 50 MG EC tablet 60 tablet 1    Sig: TAKE 1 TABLET BY MOUTH TWICE DAILY AS NEEDED FOR MODERATE PAIN, EAT BEFORE TAKING THIS MEDICATION     Analgesics:  NSAIDS Failed - 05/20/2022  4:56 PM      Failed - Manual Review: Labs are only required if the patient has taken medication for more than 8 weeks.      Failed - Cr in normal range and within 360 days    Creatinine  Date Value Ref Range Status  09/18/2019 119.5 20.0 - 300.0 mg/dL Final   Creatinine, Ser  Date Value Ref Range Status  05/03/2022 1.34 (H) 0.76 - 1.27 mg/dL Final         Passed - HGB in normal range and within 360 days    Hemoglobin  Date Value Ref Range Status  08/23/2021 15.4 13.0 - 17.0 g/dL Final  07/09/2021 17.2 13.0 - 17.7 g/dL Final         Passed - PLT in normal range and within 360 days    Platelets  Date Value Ref Range Status  08/23/2021 216 150 - 400 K/uL Final  07/09/2021 237 150 - 450 x10E3/uL Final         Passed - HCT in normal range and within 360 days    HCT  Date Value Ref Range Status  08/23/2021 46.9 39.0 - 52.0 % Final   Hematocrit  Date Value Ref Range Status  07/09/2021 50.8 37.5 - 51.0 % Final         Passed - eGFR is 30 or above and within 360 days    GFR calc Af Amer  Date Value Ref Range Status  03/03/2020 70 >59 mL/min/1.73 Final    Comment:    **In accordance with recommendations from the NKF-ASN Task force,**   [facility] is in the process of updating its eGFR calculation to the   2021 CKD-EPI creatinine equation that estimates kidney function   without a race variable.    GFR, Estimated  Date Value Ref Range Status  08/23/2021 >60 >60 mL/min Final    Comment:    (NOTE) Calculated using the CKD-EPI Creatinine Equation (2021)    eGFR  Date Value Ref Range Status  05/03/2022 59 (L) >59  mL/min/1.73 Final         Passed - Patient is not pregnant      Passed - Valid encounter within last 12 months    Recent Outpatient Visits           2 weeks ago Type 2 diabetes mellitus with hyperglycemia, with long-term current use of insulin [facility])   [place], [place], [place]   1 month ago Type 2 diabetes mellitus with hyperglycemia, with long-term current use of insulin [facility])   [place], [place] L, RPH-CPP   2 months ago Type 2 diabetes mellitus with hyperglycemia, with long-term current use of insulin [facility])   [place], [place] L, RPH-CPP   3 months ago Type 2 diabetes mellitus with hyperglycemia, with long-term current use of insulin [facility])   [place] Karle Plumber B, MD   5 months ago Type 2 diabetes mellitus with hyperglycemia, with long-term current use  of insulin [facility])   [place] Elsie Stain, MD       Future Appointments             In 2 months Joya Gaskins Burnett Harry, MD [place]

## 2022-05-21 NOTE — Telephone Encounter (Signed)
Requested medication (s) are due for refill today: Yes  Requested medication (s) are on the active medication list: Yes  Last refill:  01/19/22  Future visit scheduled: Yes  Notes to clinic:  Patient is on diclofenac and meloxicam, unsure of this refill, routing to provider for approval     Requested Prescriptions  Pending Prescriptions Disp Refills   diclofenac (VOLTAREN) 50 MG EC tablet [Pharmacy Med Name: DICLOFENAC SODIUM '50MG'$  DR TABLETS] 60 tablet 1    Sig: TAKE 1 TABLET BY MOUTH TWICE DAILY AS NEEDED FOR MODERATE PAIN. EAT BEFORE TAKING THE MEDICINE     Analgesics:  NSAIDS Failed - 05/20/2022  4:41 PM      Failed - Manual Review: Labs are only required if the patient has taken medication for more than 8 weeks.      Failed - Cr in normal range and within 360 days    Creatinine  Date Value Ref Range Status  09/18/2019 119.5 20.0 - 300.0 mg/dL Final   Creatinine, Ser  Date Value Ref Range Status  05/03/2022 1.34 (H) 0.76 - 1.27 mg/dL Final         Passed - HGB in normal range and within 360 days    Hemoglobin  Date Value Ref Range Status  08/23/2021 15.4 13.0 - 17.0 g/dL Final  07/09/2021 17.2 13.0 - 17.7 g/dL Final         Passed - PLT in normal range and within 360 days    Platelets  Date Value Ref Range Status  08/23/2021 216 150 - 400 K/uL Final  07/09/2021 237 150 - 450 x10E3/uL Final         Passed - HCT in normal range and within 360 days    HCT  Date Value Ref Range Status  08/23/2021 46.9 39.0 - 52.0 % Final   Hematocrit  Date Value Ref Range Status  07/09/2021 50.8 37.5 - 51.0 % Final         Passed - eGFR is 30 or above and within 360 days    GFR calc Af Amer  Date Value Ref Range Status  03/03/2020 70 >59 mL/min/1.73 Final    Comment:    **In accordance with recommendations from the NKF-ASN Task force,**   [facility] is in the process of updating its eGFR calculation to the   2021 CKD-EPI creatinine equation that estimates kidney function    without a race variable.    GFR, Estimated  Date Value Ref Range Status  08/23/2021 >60 >60 mL/min Final    Comment:    (NOTE) Calculated using the CKD-EPI Creatinine Equation (2021)    eGFR  Date Value Ref Range Status  05/03/2022 59 (L) >59 mL/min/1.73 Final         Passed - Patient is not pregnant      Passed - Valid encounter within last 12 months    Recent Outpatient Visits           2 weeks ago Type 2 diabetes mellitus with hyperglycemia, with long-term current use of insulin [facility])   [place], [place], [place]   1 month ago Type 2 diabetes mellitus with hyperglycemia, with long-term current use of insulin [facility])   [place], [place] L, RPH-CPP   2 months ago Type 2 diabetes mellitus with hyperglycemia, with long-term current use of insulin [facility])   [place],  RPH-CPP   3 months ago Type 2 diabetes mellitus with hyperglycemia, with long-term current use of insulin [facility])   [place] Karle Plumber B, MD   5 months ago Type 2 diabetes mellitus with hyperglycemia, with long-term current use of insulin [facility])   [place] Elsie Stain, MD       Future Appointments             In 2 months Elsie Stain, MD [place]

## 2022-05-27 ENCOUNTER — Other Ambulatory Visit: Payer: Self-pay | Admitting: Critical Care Medicine

## 2022-05-27 DIAGNOSIS — M545 Low back pain, unspecified: Secondary | ICD-10-CM

## 2022-05-27 DIAGNOSIS — G894 Chronic pain syndrome: Secondary | ICD-10-CM

## 2022-05-27 MED ORDER — TRAMADOL HCL 50 MG PO TABS
50.0000 mg | ORAL_TABLET | Freq: Four times a day (QID) | ORAL | 0 refills | Status: DC | PRN
  Filled 2022-05-27: qty 120, 30d supply, fill #0

## 2022-05-27 NOTE — Telephone Encounter (Signed)
Requested medication (s) are due for refill today - yes  Requested medication (s) are on the active medication list -yes  Future visit scheduled -yes  Last refill: 04/28/22 #120  Notes to clinic: non delegated Rx  Requested Prescriptions  Pending Prescriptions Disp Refills   traMADol (ULTRAM) 50 MG tablet 120 tablet 0    Sig: Take 1 tablet (50 mg total) by mouth every 6 (six) hours as needed for moderate pain or severe pain.     Not Delegated - Analgesics:  Opioid Agonists Failed - 05/27/2022 12:50 PM      Failed - This refill cannot be delegated      Passed - Urine Drug Screen completed in last 360 days      Passed - Valid encounter within last 3 months    Recent Outpatient Visits           3 weeks ago Type 2 diabetes mellitus with hyperglycemia, with long-term current use of insulin [facility])   [place], [place], [place]   1 month ago Type 2 diabetes mellitus with hyperglycemia, with long-term current use of insulin [facility])   [place], Arjay L, RPH-CPP   3 months ago Type 2 diabetes mellitus with hyperglycemia, with long-term current use of insulin [facility])   [place], [place] L, RPH-CPP   3 months ago Type 2 diabetes mellitus with hyperglycemia, with long-term current use of insulin [facility])   [place] Karle Plumber B, MD   5 months ago Type 2 diabetes mellitus with hyperglycemia, with long-term current use of insulin [facility])   [place] Elsie Stain, MD       Future Appointments             In 2 months Elsie Stain, MD [place]               Requested Prescriptions  Pending Prescriptions Disp Refills   traMADol (ULTRAM) 50 MG tablet 120 tablet 0    Sig: Take 1 tablet (50 mg total) by mouth every 6 (six) hours  as needed for moderate pain or severe pain.     Not Delegated - Analgesics:  Opioid Agonists Failed - 05/27/2022 12:50 PM      Failed - This refill cannot be delegated      Passed - Urine Drug Screen completed in last 360 days      Passed - Valid encounter within last 3 months    Recent Outpatient Visits           3 weeks ago Type 2 diabetes mellitus with hyperglycemia, with long-term current use of insulin [facility])   [place], [place], [place]   1 month ago Type 2 diabetes mellitus with hyperglycemia, with long-term current use of insulin [facility])   [place], [place] L, RPH-CPP   3 months ago Type 2 diabetes mellitus with hyperglycemia, with long-term current use of insulin [facility])   [place], Avalon L, RPH-CPP   3 months ago Type 2 diabetes mellitus with hyperglycemia, with long-term current use of insulin [facility])   [place] Karle Plumber B, MD   5 months ago Type 2  diabetes mellitus with hyperglycemia, with long-term current use of insulin [facility])   [place] Elsie Stain, MD       Future Appointments             In 2 months Joya Gaskins Burnett Harry, MD [place]

## 2022-05-28 ENCOUNTER — Other Ambulatory Visit: Payer: Self-pay | Admitting: Pharmacist

## 2022-05-28 ENCOUNTER — Other Ambulatory Visit: Payer: Self-pay

## 2022-05-28 DIAGNOSIS — E1165 Type 2 diabetes mellitus with hyperglycemia: Secondary | ICD-10-CM

## 2022-05-28 MED ORDER — SEMAGLUTIDE (2 MG/DOSE) 8 MG/3ML ~~LOC~~ SOPN
2.0000 mg | PEN_INJECTOR | SUBCUTANEOUS | 2 refills | Status: DC
  Filled 2022-05-28 (×2): qty 9, 84d supply, fill #0
  Filled 2022-06-17: qty 3, 28d supply, fill #0
  Filled 2022-07-15: qty 3, 28d supply, fill #1
  Filled 2022-08-12: qty 3, 28d supply, fill #2
  Filled 2022-09-14: qty 3, 28d supply, fill #3
  Filled 2022-10-10: qty 3, 28d supply, fill #4
  Filled 2022-11-14: qty 3, 28d supply, fill #5

## 2022-05-31 ENCOUNTER — Other Ambulatory Visit: Payer: Self-pay | Admitting: Critical Care Medicine

## 2022-06-01 ENCOUNTER — Other Ambulatory Visit: Payer: Self-pay | Admitting: Physician Assistant

## 2022-06-01 ENCOUNTER — Other Ambulatory Visit: Payer: Self-pay

## 2022-06-01 DIAGNOSIS — G8929 Other chronic pain: Secondary | ICD-10-CM

## 2022-06-01 MED ORDER — OMEPRAZOLE 20 MG PO CPDR
20.0000 mg | DELAYED_RELEASE_CAPSULE | Freq: Every day | ORAL | 3 refills | Status: DC
  Filled 2022-06-01: qty 30, 30d supply, fill #0
  Filled 2022-06-25: qty 30, 30d supply, fill #1
  Filled 2022-08-12: qty 30, 30d supply, fill #2
  Filled 2022-09-14: qty 30, 30d supply, fill #3

## 2022-06-01 NOTE — Telephone Encounter (Signed)
Requested Prescriptions  Pending Prescriptions Disp Refills   omeprazole (PRILOSEC) 20 MG capsule 30 capsule 3    Sig: Take 1 capsule (20 mg total) by mouth daily.     Gastroenterology: Proton Pump Inhibitors Passed - 05/31/2022 12:43 PM      Passed - Valid encounter within last 12 months    Recent Outpatient Visits           4 weeks ago Type 2 diabetes mellitus with hyperglycemia, with long-term current use of insulin [facility])   [place], [place], [place]   2 months ago Type 2 diabetes mellitus with hyperglycemia, with long-term current use of insulin [facility])   [place], [place] L, RPH-CPP   3 months ago Type 2 diabetes mellitus with hyperglycemia, with long-term current use of insulin [facility])   [place], [place] L, RPH-CPP   3 months ago Type 2 diabetes mellitus with hyperglycemia, with long-term current use of insulin [facility])   [place] Karle Plumber B, MD   5 months ago Type 2 diabetes mellitus with hyperglycemia, with long-term current use of insulin [facility])   [place] Elsie Stain, MD       Future Appointments             In 2 months Elsie Stain, MD [place]

## 2022-06-02 ENCOUNTER — Other Ambulatory Visit: Payer: Self-pay

## 2022-06-02 NOTE — Telephone Encounter (Signed)
Medication no longer on current medication list Requested Prescriptions  Pending Prescriptions Disp Refills   meloxicam (MOBIC) 15 MG tablet 30 tablet 2    Sig: Take 1 tablet (15 mg total) by mouth daily.     Analgesics:  COX2 Inhibitors Failed - 06/01/2022 11:26 AM      Failed - Manual Review: Labs are only required if the patient has taken medication for more than 8 weeks.      Failed - Cr in normal range and within 360 days    Creatinine  Date Value Ref Range Status  09/18/2019 119.5 20.0 - 300.0 mg/dL Final   Creatinine, Ser  Date Value Ref Range Status  05/03/2022 1.34 (H) 0.76 - 1.27 mg/dL Final         Failed - ALT in normal range and within 360 days    ALT  Date Value Ref Range Status  05/03/2022 45 (H) 0 - 44 IU/L Final         Passed - HGB in normal range and within 360 days    Hemoglobin  Date Value Ref Range Status  08/23/2021 15.4 13.0 - 17.0 g/dL Final  07/09/2021 17.2 13.0 - 17.7 g/dL Final         Passed - HCT in normal range and within 360 days    HCT  Date Value Ref Range Status  08/23/2021 46.9 39.0 - 52.0 % Final   Hematocrit  Date Value Ref Range Status  07/09/2021 50.8 37.5 - 51.0 % Final         Passed - AST in normal range and within 360 days    AST  Date Value Ref Range Status  05/03/2022 27 0 - 40 IU/L Final         Passed - eGFR is 30 or above and within 360 days    GFR calc Af Amer  Date Value Ref Range Status  03/03/2020 70 >59 mL/min/1.73 Final    Comment:    **In accordance with recommendations from the NKF-ASN Task force,**   [facility] is in the process of updating its eGFR calculation to the   2021 CKD-EPI creatinine equation that estimates kidney function   without a race variable.    GFR, Estimated  Date Value Ref Range Status  08/23/2021 >60 >60 mL/min Final    Comment:    (NOTE) Calculated using the CKD-EPI Creatinine Equation (2021)    eGFR  Date Value Ref Range Status  05/03/2022 59 (L) >59 mL/min/1.73 Final          Passed - Patient is not pregnant      Passed - Valid encounter within last 12 months    Recent Outpatient Visits           1 month ago Type 2 diabetes mellitus with hyperglycemia, with long-term current use of insulin [facility])   [place], [place], [place]   2 months ago Type 2 diabetes mellitus with hyperglycemia, with long-term current use of insulin [facility])   [place], [place] L, RPH-CPP   3 months ago Type 2 diabetes mellitus with hyperglycemia, with long-term current use of insulin [facility])   [place], [place] L, RPH-CPP   3 months ago Type 2 diabetes mellitus with hyperglycemia, with long-term current use of insulin [facility])   [place] Ladell Pier, MD   5  months ago Type 2 diabetes mellitus with hyperglycemia, with long-term current use of insulin [facility])   [place] Elsie Stain, MD       Future Appointments             In 2 months Joya Gaskins Burnett Harry, MD [place]

## 2022-06-04 ENCOUNTER — Telehealth: Payer: Self-pay | Admitting: Critical Care Medicine

## 2022-06-04 DIAGNOSIS — J4489 Other specified chronic obstructive pulmonary disease: Secondary | ICD-10-CM

## 2022-06-04 NOTE — Telephone Encounter (Signed)
Order for new neb machine placed will print on printer today Joshua [name]

## 2022-06-04 NOTE — Telephone Encounter (Signed)
Copied from [place]. Topic: General - Other >> Jun 04, 2022 10:22 AM Cyndi Bender wrote: Reason for CRM: Pt reports that his nebulizer machine broke last night so he is need of a new one. Pt requests call back at [phone]

## 2022-06-04 NOTE — Addendum Note (Signed)
Addended by: Elsie Stain on: 06/04/2022 02:38 PM   Modules accepted: Orders

## 2022-06-07 NOTE — Telephone Encounter (Signed)
Pts wife Abigail Butts is calling to check on the order for a new nublizer machine. Please advise CB- [phone]

## 2022-06-07 NOTE — Telephone Encounter (Signed)
I called patient and informed him that I have the order for the nebulizer and inquire if he has a preferred DME company. He said he has no preference and I said I will send the order to [facility] and they will contact him about insurance coverage and pick up/deliver of the machine.  He said he understood.  Order then faxed to [place]

## 2022-06-16 ENCOUNTER — Other Ambulatory Visit: Payer: Self-pay

## 2022-06-16 ENCOUNTER — Telehealth: Payer: Self-pay

## 2022-06-16 NOTE — Telephone Encounter (Signed)
Spoke with patient to verify which DME company he would prefer getting his nebulizer through. Patient voiced that [facility] delivered nebulizer last week. Voices that nebulizer not working right. Advised patient to connect [facility] so that the machine can be serviced.

## 2022-06-17 ENCOUNTER — Other Ambulatory Visit: Payer: Self-pay

## 2022-06-21 ENCOUNTER — Other Ambulatory Visit: Payer: Self-pay | Admitting: Critical Care Medicine

## 2022-06-21 ENCOUNTER — Telehealth: Payer: Self-pay

## 2022-06-21 DIAGNOSIS — G8929 Other chronic pain: Secondary | ICD-10-CM

## 2022-06-21 NOTE — Telephone Encounter (Signed)
I spoke to Joshua [name] and she confirmed that the nebulizer was delivered to the patient  06/09/2022.

## 2022-06-22 ENCOUNTER — Other Ambulatory Visit (HOSPITAL_BASED_OUTPATIENT_CLINIC_OR_DEPARTMENT_OTHER): Payer: Self-pay

## 2022-06-22 MED ORDER — DICLOFENAC SODIUM 50 MG PO TBEC
DELAYED_RELEASE_TABLET | ORAL | 0 refills | Status: DC
  Filled 2022-06-22: qty 60, 30d supply, fill #0

## 2022-06-22 NOTE — Telephone Encounter (Signed)
Patient would like diclofenac (VOLTAREN) 50 MG EC tablet be sent to  [facility] Phone: [phone]  Fax: [phone]    It was accidentally requested to be sent to [facility].

## 2022-06-22 NOTE — Telephone Encounter (Signed)
[place] Phone: [phone]  Fax: [phone]    Is the correct pharmacy. Not [facility]

## 2022-06-23 ENCOUNTER — Other Ambulatory Visit: Payer: Self-pay

## 2022-06-25 ENCOUNTER — Other Ambulatory Visit: Payer: Self-pay

## 2022-06-27 ENCOUNTER — Other Ambulatory Visit: Payer: Self-pay | Admitting: Critical Care Medicine

## 2022-06-27 DIAGNOSIS — G894 Chronic pain syndrome: Secondary | ICD-10-CM

## 2022-06-27 DIAGNOSIS — M545 Low back pain, unspecified: Secondary | ICD-10-CM

## 2022-06-29 ENCOUNTER — Other Ambulatory Visit: Payer: Self-pay

## 2022-06-29 MED ORDER — TRAMADOL HCL 50 MG PO TABS
50.0000 mg | ORAL_TABLET | Freq: Four times a day (QID) | ORAL | 0 refills | Status: DC | PRN
  Filled 2022-06-29: qty 120, 30d supply, fill #0

## 2022-07-03 ENCOUNTER — Telehealth: Payer: 59 | Admitting: Physician Assistant

## 2022-07-03 DIAGNOSIS — J069 Acute upper respiratory infection, unspecified: Secondary | ICD-10-CM

## 2022-07-03 MED ORDER — BENZONATATE 100 MG PO CAPS: 100.0000 mg | ORAL_CAPSULE | Freq: Three times a day (TID) | ORAL | 0 refills | Status: DC | PRN

## 2022-07-03 MED ORDER — FLUTICASONE PROPIONATE 50 MCG/ACT NA SUSP: 2.0000 | Freq: Every day | NASAL | 6 refills | Status: DC

## 2022-07-03 NOTE — Progress Notes (Signed)
E-Visit for Upper Respiratory Infection   We are sorry you are not feeling well.  Here is how we plan to help!  Based on what you have shared with me, it looks like you may have a viral upper respiratory infection.  Upper respiratory infections are caused by a large number of viruses; however, rhinovirus is the most common cause.   Symptoms vary from person to person, with common symptoms including sore throat, cough, fatigue or lack of energy and feeling of general discomfort.  A low-grade fever of up to 100.4 may present, but is often uncommon.  Symptoms vary however, and are closely related to a person's age or underlying illnesses.  The most common symptoms associated with an upper respiratory infection are nasal discharge or congestion, cough, sneezing, headache and pressure in the ears and face.  These symptoms usually persist for about 3 to 10 days, but can last up to 2 weeks.  It is important to know that upper respiratory infections do not cause serious illness or complications in most cases.    Upper respiratory infections can be transmitted from person to person, with the most common method of transmission being a person's hands.  The virus is able to live on the skin and can infect other persons for up to 2 hours after direct contact.  Also, these can be transmitted when someone coughs or sneezes; thus, it is important to cover the mouth to reduce this risk.  To keep the spread of the illness at bay, good hand hygiene is very important.  This is an infection that is most likely caused by a virus. There are no specific treatments other than to help you with the symptoms until the infection runs its course.  We are sorry you are not feeling well.  Here is how we plan to help!   For nasal congestion, you may use an oral decongestants such as Mucinex D or if you have glaucoma or high blood pressure use plain Mucinex.  Saline nasal spray or nasal drops can help and can safely be used as often as  needed for congestion.  For your congestion, I have prescribed Fluticasone nasal spray one spray in each nostril twice a day  If you do not have a history of heart disease, hypertension, diabetes or thyroid disease, prostate/bladder issues or glaucoma, you may also use Sudafed to treat nasal congestion.  It is highly recommended that you consult with a pharmacist or your primary care physician to ensure this medication is safe for you to take.     If you have a cough, you may use cough suppressants such as Delsym and Robitussin.  If you have glaucoma or high blood pressure, you can also use Coricidin HBP.   For cough I have prescribed for you A prescription cough medication called Tessalon Perles 100 mg. You may take 1-2 capsules every 8 hours as needed for cough  If you have a sore or scratchy throat, use a saltwater gargle-  to  teaspoon of salt dissolved in a 4-ounce to 8-ounce glass of warm water.  Gargle the solution for approximately 15-30 seconds and then spit.  It is important not to swallow the solution.  You can also use throat lozenges/cough drops and Chloraseptic spray to help with throat pain or discomfort.  Warm or cold liquids can also be helpful in relieving throat pain.  For headache, pain or general discomfort, you can use Ibuprofen or Tylenol as directed.   Some authorities believe   that zinc sprays or the use of Echinacea may shorten the course of your symptoms.   HOME CARE Only take medications as instructed by your medical team. Be sure to drink plenty of fluids. Water is fine as well as fruit juices, sodas and electrolyte beverages. You may want to stay away from caffeine or alcohol. If you are nauseated, try taking small sips of liquids. How do you know if you are getting enough fluid? Your urine should be a pale yellow or almost colorless. Get rest. Taking a steamy shower or using a humidifier may help nasal congestion and ease sore throat pain. You can place a towel over  your head and breathe in the steam from hot water coming from a faucet. Using a saline nasal spray works much the same way. Cough drops, hard candies and sore throat lozenges may ease your cough. Avoid close contacts especially the very young and the elderly Cover your mouth if you cough or sneeze Always remember to wash your hands.   GET HELP RIGHT AWAY IF: You develop worsening fever. If your symptoms do not improve within 10 days You develop yellow or green discharge from your nose over 3 days. You have coughing fits You develop a severe head ache or visual changes. You develop shortness of breath, difficulty breathing or start having chest pain Your symptoms persist after you have completed your treatment plan  MAKE SURE YOU  Understand these instructions. Will watch your condition. Will get help right away if you are not doing well or get worse.  Thank you for choosing an e-visit.  Your e-visit answers were reviewed by a board certified advanced clinical practitioner to complete your personal care plan. Depending upon the condition, your plan could have included both over the counter or prescription medications.  Please review your pharmacy choice. Make sure the pharmacy is open so you can pick up prescription now. If there is a problem, you may contact your provider through MyChart messaging and have the prescription routed to another pharmacy.  Your safety is important to us. If you have drug allergies check your prescription carefully.   For the next 24 hours you can use MyChart to ask questions about today's visit, request a non-urgent call back, or ask for a work or school excuse. You will get an email in the next two days asking about your experience. I hope that your e-visit has been valuable and will speed your recovery.    I have spent 5 minutes in review of e-visit questionnaire, review and updating patient chart, medical decision making and response to patient.    Jennfier Abdulla Z Ward, PA-C    

## 2022-07-15 ENCOUNTER — Other Ambulatory Visit: Payer: Self-pay

## 2022-07-20 ENCOUNTER — Other Ambulatory Visit: Payer: Self-pay | Admitting: Critical Care Medicine

## 2022-07-20 ENCOUNTER — Other Ambulatory Visit: Payer: Self-pay

## 2022-07-20 DIAGNOSIS — G8929 Other chronic pain: Secondary | ICD-10-CM

## 2022-07-20 MED ORDER — DICLOFENAC SODIUM 50 MG PO TBEC
DELAYED_RELEASE_TABLET | ORAL | 0 refills | Status: DC
  Filled 2022-07-20: qty 60, 30d supply, fill #0

## 2022-07-21 ENCOUNTER — Other Ambulatory Visit: Payer: Self-pay

## 2022-07-23 ENCOUNTER — Other Ambulatory Visit: Payer: Self-pay

## 2022-07-26 ENCOUNTER — Other Ambulatory Visit: Payer: Self-pay

## 2022-07-27 ENCOUNTER — Other Ambulatory Visit: Payer: Self-pay | Admitting: Critical Care Medicine

## 2022-07-27 DIAGNOSIS — M545 Low back pain, unspecified: Secondary | ICD-10-CM

## 2022-07-27 DIAGNOSIS — G894 Chronic pain syndrome: Secondary | ICD-10-CM

## 2022-07-27 NOTE — Telephone Encounter (Signed)
Requested medication (s) are due for refill today: yes  Requested medication (s) are on the active medication list: yes  Last refill:  06/29/22  Future visit scheduled: yes  Notes to clinic:  Unable to refill per protocol, cannot delegate.      Requested Prescriptions  Pending Prescriptions Disp Refills   traMADol (ULTRAM) 50 MG tablet 120 tablet 0    Sig: Take 1 tablet (50 mg total) by mouth every 6 (six) hours as needed for moderate pain or severe pain.     Not Delegated - Analgesics:  Opioid Agonists Failed - 07/27/2022  7:22 AM      Failed - This refill cannot be delegated      Passed - Urine Drug Screen completed in last 360 days      Passed - Valid encounter within last 3 months    Recent Outpatient Visits           2 months ago Type 2 diabetes mellitus with hyperglycemia, with long-term current use of insulin [facility])   [place], [employer], [place]   3 months ago Type 2 diabetes mellitus with hyperglycemia, with long-term current use of insulin [facility])   [place], [place] L, RPH-CPP   5 months ago Type 2 diabetes mellitus with hyperglycemia, with long-term current use of insulin [facility])   [place], [place] L, RPH-CPP   5 months ago Type 2 diabetes mellitus with hyperglycemia, with long-term current use of insulin [facility])   [place] Karle Plumber B, MD   7 months ago Type 2 diabetes mellitus with hyperglycemia, with long-term current use of insulin [facility])   [place] Elsie Stain, MD       Future Appointments             In 1 week Elsie Stain, MD [place]

## 2022-07-28 ENCOUNTER — Other Ambulatory Visit: Payer: Self-pay

## 2022-07-28 MED ORDER — TRAMADOL HCL 50 MG PO TABS
50.0000 mg | ORAL_TABLET | Freq: Four times a day (QID) | ORAL | 0 refills | Status: DC | PRN
  Filled 2022-07-28: qty 120, 30d supply, fill #0

## 2022-08-01 NOTE — Progress Notes (Unsigned)
Established Patient Office Visit  Subjective:  Patient ID: Joshua [name], male    DOB: 09/27/1955  Age: 67 y.o. MRN: [id number]  CC:  Cough  HPI 04/28/21 Joshua [name] presents for primary care follow-up for diabetes and hypertension.  Patient continues to smoke 5 to 6 cigarettes daily at this time.  He has not been compliant with his diet occasionally his fasting sugars are 265-220.  When I review his glucometer his blood sugar ranges anywhere from 1 80-2 10 in the morning fasting.  He self increased his insulin glargine to 30 units a day.  He ran out of refills and cannot get an early refill with this medication.  The patient wishes to hold off on his pneumonia vaccine at this visit.  Patient does have a Cologuard kit at home he states he will have this process soon.  Patient is needing a new nebulizer cup and mouthpiece for his nebulizer machine.  He does maintain the Trulicity weekly along with the insulin glargine and the Jardiance 25 mg daily note he cannot tolerate metformin due to side effects.  Patient has a chronic pain management contract that he has been compliant with and needs refills on tramadol.  Drug database was checked and showed no frequent multiple prescribers no evidence of drug diversion  Patient's mental health status is stable at this time he is on monthly Invega injections at this time  4/17 Patient returns for primary care follow-up with complaints of pain in the right and left lower extremity.   Patient with history of significant delusion.  He thinks he has been bit by [name].  Patient also is concerned about tick infestation. Patient is followed by [name] and receives [name] injections on a monthly basis.  He would like to receive these here but was explained that he needs to stay with his mental health provider for same.  Patient is having increased wheezing and productive cough of green mucus.  He is still actively smoking.  09/01/21 Patient is  seen as a work in post ER visit.  He was sent by the police upon urging of his spouse April 30 to the emergency room at [facility] for evaluation by psychiatry.  He had wildly psychotic ideation.  He thought he was Scientist, physiological.  He was screaming out at neighbors and bothering him.  Patient was seen by psychiatry no intervention made and was declared to not be a threat to himself or others and was discharged home.  He has since been to [facility] and receives his monthly Invega injection although at a lower dose. The patient has been referred to the below mental Health Center but the patient's spouse is unclear where to go and needs this information to make the appointment.  The patient is here today by himself. [name] [address], [place], [place] [address] [phone] Your referral was sent here for therapy and psychiatry  Today the patient is fairly cooperative however he is definitely psychotic he thinks he sees ghosts and snakes and sees things moving in the room.  He is cooperative and is not suicidal or homicidal.  Blood pressure initially on arrival was 170/105 but on recheck was 151/96.  He is only on the valsartan 80 mg daily. Patient is still actively smoking  6/6 Patient seen in return follow-up and states he is having increased sore throat increased cough productive of brown mucus shortness of breath as well.  He is  still smoking about a half a pack a day of cigarettes.  He states his blood sugars improved on Ozempic and he is lost some weight.  Blood pressure does remain elevated 168/99.  Patient does maintain valsartan HCT 160.  Blood pressure meter is needed for the patient.  He would also like a refill on Prilosec.  There are no other complaints. The patient elected to return to [facility] for his Invega injections and is improved mentally  7/10 Patient returns with increased cough productive of white to yellow-green mucus he took a course of  cefdinir with some improvement now has worsened on arrival blood pressure elevated 151/94 on repeat is 148/92 patient does continue with his Invega injections monthly at [facility] patient notes some wheezing  8/16 Patient is seen in return follow-up from a COPD perspective he is breathing better he is coughing less wheezing less.  His smoking is down to half a pack a day of cigarettes.  On arrival blood pressure is good 128/78 but blood sugar is 225.  Patient is been on the 0.5 mg weekly Ozempic at this time.  He cannot take statins and he has elevated cholesterol.  Patient needs follow-up metabolic panel and lipid panel at this visit. Patient still has chronic pain in both hips and knees.  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   Chronic pain    Diabetes mellitus without complication (HCC)    Generalized anxiety disorder    Headache    Hypertension    Schizoaffective disorder (HCC)     Past Surgical History:  Procedure Laterality Date   APPENDECTOMY  1970    Family History  Problem Relation Age of Onset   Diabetes Mother    Cancer Mother        unsure what type   Heart disease Mother    Hypertension Mother    Dementia Father    Diabetes Brother    Dementia Maternal Grandfather    Cancer Paternal Grandmother 35       breast   Mental illness Neg Hx     Social History   Socioeconomic History   Marital status: Married    Spouse name: Not on file   Number of children: Not on file   Years of education: Not on file   Highest education level: Not on file  Occupational History   Not on file  Tobacco Use   Smoking status: Every Day    Packs/day: 2.00    Years: 50.00    Additional pack years: 0.00    Total pack years: 100.00    Types: Cigarettes   Smokeless  tobacco: Never  Vaping Use   Vaping Use: Never used  Substance and Sexual Activity   Alcohol use: Not Currently   Drug use: Yes    Types: Marijuana    Comment: "have changed to hemp now" 09/17/17   Sexual activity: Yes  Other Topics Concern   Not on file  Social History Narrative   Not on file   Social Determinants of Health   Financial Resource Strain: Low Risk  (10/01/2021)   Overall Financial Resource Strain (CARDIA)    Difficulty of Paying Living Expenses: Not hard at all  Food Insecurity: No Food Insecurity (10/01/2021)   Hunger Vital Sign  Worried About Programme researcher, broadcasting/film/video in the Last Year: Never true    Ran Out of Food in the Last Year: Never true  Transportation Needs: No Transportation Needs (10/01/2021)   PRAPARE - Administrator, Civil Service (Medical): No    Lack of Transportation (Non-Medical): No  Physical Activity: Inactive (10/01/2021)   Exercise Vital Sign    Days of Exercise per Week: 0 days    Minutes of Exercise per Session: 0 min  Stress: No Stress Concern Present (10/01/2021)   [employer] of Occupational Health - Occupational Stress Questionnaire    Feeling of Stress : Not at all  Social Connections: Not on file  Intimate Partner Violence: Not on file    Outpatient Medications Prior to Visit  Medication Sig Dispense Refill   Accu-Chek FastClix Lancets MISC Use to check blood sugars three times per day 102 each 11   acyclovir ointment (ZOVIRAX) 5 % Apply onto affected area of lips /peri oral area topically every 6 (six) hours. 30 g 0   albuterol (PROAIR HFA) 108 (90 Base) MCG/ACT inhaler Inhale 2 puffs into the lungs every 4 (four) hours as needed for wheezing or shortness of breath. 8.5 g 3   amLODipine (NORVASC) 5 MG tablet Take 1 tablet (5 mg total) by mouth daily. 90 tablet 2   benzonatate (TESSALON) 100 MG capsule Take 1 capsule (100 mg total) by mouth 3 (three) times daily as needed. 20 capsule 0   blood glucose meter kit and supplies  KIT Dispense based on patient and insurance preference. Use up to four times daily as directed. ICD-10 E11.65  Z79.4 1 each 0   Blood Glucose Monitoring Suppl (ACCU-CHEK GUIDE ME) w/Device KIT Use to check blood sugars up to 3 times per day. ICD-10 E11.65, Z79.4 1 kit 0   Blood Pressure Monitoring (BLOOD PRESSURE KIT) DEVI Use to measure blood pressure 1 each 0   cetirizine (ZYRTEC) 10 MG tablet Take 1 tablet (10 mg total) by mouth daily. 30 tablet 11   clotrimazole (LOTRIMIN) 1 % cream Apply to affected area 2 times daily 15 g 0   diclofenac (VOLTAREN) 50 MG EC tablet TAKE 1 TABLET BY MOUTH TWICE DAILY AS NEEDED FOR MODERATE PAIN. EAT BEFORE TAKING THE MEDICINE 60 tablet 0   divalproex (DEPAKOTE ER) 500 MG 24 hr tablet Take 1,500 mg by mouth at bedtime.     fluticasone (FLONASE) 50 MCG/ACT nasal spray Place 2 sprays into both nostrils daily. 16 g 6   fluticasone-salmeterol (ADVAIR) 250-50 MCG/ACT AEPB Inhale 1 puff into the lungs 2 (two) times daily. 1 each 6   gabapentin (NEURONTIN) 300 MG capsule Take 2 capsules (600 mg total) by mouth 3 (three) times daily. 180 capsule 3   glucose blood (ACCU-CHEK GUIDE) test strip Use as instructed to check blood sugars three times per day. 100 each 11   hydrOXYzine (ATARAX) 50 MG tablet Take 1 tablet (50 mg total) by mouth 3 (three) times daily. 90 tablet 2   insulin glargine (LANTUS) 100 unit/mL SOPN Inject 38 Units into the skin at bedtime. 15 mL 4   insulin lispro (HUMALOG KWIKPEN) 100 UNIT/ML KwikPen Inject 10 Units into the skin 3 (three) times daily. 15 mL 1   Insulin Pen Needle (PEN NEEDLES) 32G X 4 MM MISC Use to inject insulin four times daily. Also, Ozempic once weekly. 200 each 3   INVEGA SUSTENNA injection Inject 117 mg into the muscle every 30 (thirty) days.  ipratropium-albuterol (DUONEB) 0.5-2.5 (3) MG/3ML SOLN Take 3 mLs by nebulization every 6 (six) hours as needed (shortness of breath). 120 mL 1   Lancets Misc. (ACCU-CHEK FASTCLIX LANCET)  KIT Use to check blood sugars 3 times per day 1 kit 11   mupirocin ointment (BACTROBAN) 2 % Apply 1 Application topically 2 (two) times daily. 22 g 0   omeprazole (PRILOSEC) 20 MG capsule Take 1 capsule (20 mg total) by mouth daily. 30 capsule 3   paliperidone (INVEGA SUSTENNA) 156 MG/ML SUSY injection      Respiratory Therapy Supplies (FLUTTER) DEVI Use with 4 times daily 1 each 0   Semaglutide, 2 MG/DOSE, 8 MG/3ML SOPN Inject 2 mg as directed once a week. 9 mL 2   Skin Protectants, Misc. (EUCERIN) cream Apply topically as needed for dry skin. 454 g 0   Spacer/Aero-Holding Chambers (AEROCHAMBER MV) inhaler Use as instructed 1 each 0   SUMAtriptan (IMITREX) 50 MG tablet Take 1 tablet (50 mg total) by mouth every 2 (two) hours as needed for migraine. May repeat in 2 hours if headache persists or recurs. 10 tablet 2   traMADol (ULTRAM) 50 MG tablet Take 1 tablet (50 mg total) by mouth every 6 (six) hours as needed for moderate pain or severe pain. 120 tablet 0   valsartan-hydrochlorothiazide (DIOVAN-HCT) 320-25 MG tablet Take 1 tablet by mouth daily. 90 tablet 3   No facility-administered medications prior to visit.    Allergies  Allergen Reactions   Statins Other (See Comments)    Myopathy   Haldol [Haloperidol] Other (See Comments)    Unk reaction   Metformin And Related Diarrhea and Nausea And Vomiting   Trazodone And Nefazodone Itching    ROS Review of Systems  Constitutional: Negative.   HENT:  Negative for ear pain, postnasal drip, rhinorrhea, sinus pressure, sore throat, trouble swallowing and voice change.   Eyes: Negative.   Respiratory:  Negative for apnea, cough, choking, chest tightness, shortness of breath, wheezing and stridor.   Cardiovascular: Negative.  Negative for chest pain, palpitations and leg swelling.  Gastrointestinal: Negative.  Negative for abdominal distention, abdominal pain, nausea and vomiting.  Genitourinary: Negative.   Musculoskeletal:  Positive for  back pain. Negative for arthralgias and myalgias.  Skin: Negative.  Negative for rash.  Allergic/Immunologic: Negative.  Negative for environmental allergies and food allergies.  Neurological: Negative.  Negative for dizziness, syncope and weakness.  Hematological: Negative.  Negative for adenopathy. Does not bruise/bleed easily.  Psychiatric/Behavioral:  Negative for agitation, behavioral problems, confusion, decreased concentration, dysphoric mood, hallucinations, self-injury, sleep disturbance and suicidal ideas. The patient is not nervous/anxious and is not hyperactive.       Objective:    Physical Exam Vitals reviewed.  Constitutional:      Appearance: Normal appearance. He is well-developed. He is obese. He is not diaphoretic.  HENT:     Head: Normocephalic and atraumatic.     Nose: Nose normal. No nasal deformity, septal deviation, mucosal edema or rhinorrhea.     Right Sinus: No maxillary sinus tenderness or frontal sinus tenderness.     Left Sinus: No maxillary sinus tenderness or frontal sinus tenderness.     Mouth/Throat:     Mouth: Mucous membranes are moist.     Pharynx: Oropharynx is clear. No oropharyngeal exudate.     Comments: Poor dentition with multiple dental caries Eyes:     General: No scleral icterus.    Conjunctiva/sclera: Conjunctivae normal.     Pupils: Pupils are  equal, round, and reactive to light.  Neck:     Thyroid: No thyromegaly.     Vascular: No carotid bruit or JVD.     Trachea: Trachea normal. No tracheal tenderness or tracheal deviation.  Cardiovascular:     Rate and Rhythm: Normal rate and regular rhythm.     Chest Wall: PMI is not displaced.     Pulses: Normal pulses. No decreased pulses.     Heart sounds: Normal heart sounds, S1 normal and S2 normal. Heart sounds not distant. No murmur heard.    No systolic murmur is present.     No diastolic murmur is present.     No friction rub. No gallop. No S3 or S4 sounds.  Pulmonary:     Effort:  Pulmonary effort is normal. No tachypnea, accessory muscle usage or respiratory distress.     Breath sounds: No stridor. No decreased breath sounds, wheezing, rhonchi or rales.  Chest:     Chest wall: No tenderness.  Abdominal:     General: Bowel sounds are normal. There is no distension.     Palpations: Abdomen is soft. Abdomen is not rigid.     Tenderness: There is no abdominal tenderness. There is no guarding or rebound.  Musculoskeletal:        General: Normal range of motion.     Cervical back: Normal range of motion and neck supple. No edema, erythema or rigidity. No muscular tenderness. Normal range of motion.  Lymphadenopathy:     Head:     Right side of head: No submental or submandibular adenopathy.     Left side of head: No submental or submandibular adenopathy.     Cervical: No cervical adenopathy.  Skin:    General: Skin is warm and dry.     Coloration: Skin is not pale.     Findings: No rash.     Nails: There is no clubbing.  Neurological:     Mental Status: He is alert and oriented to person, place, and time.     Sensory: No sensory deficit.  Psychiatric:        Attention and Perception: Attention normal. He does not perceive auditory or visual hallucinations.        Mood and Affect: Affect is not labile.        Speech: Speech is not tangential.        Behavior: Behavior is not agitated. Behavior is cooperative.        Thought Content: Thought content is not paranoid or delusional. Thought content does not include homicidal or suicidal ideation. Thought content does not include homicidal or suicidal plan.        Cognition and Memory: Cognition is not impaired. Memory is not impaired.        Judgment: Judgment is not impulsive or inappropriate.     There were no vitals taken for this visit. Wt Readings from Last 3 Encounters:  05/03/22 247 lb 9.6 oz (112.3 kg)  02/11/22 227 lb (103 kg)  12/09/21 231 lb 3.2 oz (104.9 kg)     Health Maintenance Due  Topic Date  Due   Fecal DNA (Cologuard)  Never done   Lung Cancer Screening  Never done   Diabetic kidney evaluation - Urine ACR  07/19/2020   OPHTHALMOLOGY EXAM  09/04/2021    There are no preventive care reminders to display for this patient.  Lab Results  Component Value Date   TSH 1.140 08/28/2018   Lab Results  Component  Value Date   WBC 10.0 08/23/2021   HGB 15.4 08/23/2021   HCT 46.9 08/23/2021   MCV 92.3 08/23/2021   PLT 216 08/23/2021   Lab Results  Component Value Date   NA 133 (L) 05/03/2022   K 4.7 05/03/2022   CO2 22 05/03/2022   GLUCOSE 258 (H) 05/03/2022   BUN 26 05/03/2022   CREATININE 1.34 (H) 05/03/2022   BILITOT 0.2 05/03/2022   ALKPHOS 131 (H) 05/03/2022   AST 27 05/03/2022   ALT 45 (H) 05/03/2022   PROT 6.5 05/03/2022   ALBUMIN 4.4 05/03/2022   CALCIUM 9.7 05/03/2022   ANIONGAP 11 08/23/2021   EGFR 59 (L) 05/03/2022   Lab Results  Component Value Date   CHOL 205 (H) 12/09/2021   Lab Results  Component Value Date   HDL 48 12/09/2021   Lab Results  Component Value Date   LDLCALC 110 (H) 12/09/2021   Lab Results  Component Value Date   TRIG 276 (H) 12/09/2021   Lab Results  Component Value Date   CHOLHDL 4.3 12/09/2021   Lab Results  Component Value Date   HGBA1C 9.3 (A) 05/03/2022      Assessment & Plan:   Problem List Items Addressed This Visit   None No orders of the defined types were placed in this encounter.   Follow-up: No follow-ups on file.    Joshua LevansPatrick Harding Thomure, MD

## 2022-08-03 ENCOUNTER — Encounter: Payer: Self-pay | Admitting: Critical Care Medicine

## 2022-08-03 ENCOUNTER — Other Ambulatory Visit: Payer: Self-pay

## 2022-08-03 ENCOUNTER — Ambulatory Visit: Payer: 59 | Attending: Critical Care Medicine | Admitting: Critical Care Medicine

## 2022-08-03 VITALS — BP 132/72 | HR 101 | Wt 259.8 lb

## 2022-08-03 DIAGNOSIS — G720 Drug-induced myopathy: Secondary | ICD-10-CM

## 2022-08-03 DIAGNOSIS — G43009 Migraine without aura, not intractable, without status migrainosus: Secondary | ICD-10-CM

## 2022-08-03 DIAGNOSIS — I10 Essential (primary) hypertension: Secondary | ICD-10-CM

## 2022-08-03 DIAGNOSIS — F250 Schizoaffective disorder, bipolar type: Secondary | ICD-10-CM

## 2022-08-03 DIAGNOSIS — F17210 Nicotine dependence, cigarettes, uncomplicated: Secondary | ICD-10-CM

## 2022-08-03 DIAGNOSIS — G894 Chronic pain syndrome: Secondary | ICD-10-CM

## 2022-08-03 DIAGNOSIS — J449 Chronic obstructive pulmonary disease, unspecified: Secondary | ICD-10-CM | POA: Insufficient documentation

## 2022-08-03 DIAGNOSIS — Z794 Long term (current) use of insulin: Secondary | ICD-10-CM

## 2022-08-03 DIAGNOSIS — E782 Mixed hyperlipidemia: Secondary | ICD-10-CM | POA: Diagnosis not present

## 2022-08-03 DIAGNOSIS — T466X5A Adverse effect of antihyperlipidemic and antiarteriosclerotic drugs, initial encounter: Secondary | ICD-10-CM

## 2022-08-03 DIAGNOSIS — E1351 Other specified diabetes mellitus with diabetic peripheral angiopathy without gangrene: Secondary | ICD-10-CM

## 2022-08-03 DIAGNOSIS — E1165 Type 2 diabetes mellitus with hyperglycemia: Secondary | ICD-10-CM | POA: Diagnosis not present

## 2022-08-03 DIAGNOSIS — J441 Chronic obstructive pulmonary disease with (acute) exacerbation: Secondary | ICD-10-CM

## 2022-08-03 DIAGNOSIS — J4489 Other specified chronic obstructive pulmonary disease: Secondary | ICD-10-CM

## 2022-08-03 DIAGNOSIS — G629 Polyneuropathy, unspecified: Secondary | ICD-10-CM

## 2022-08-03 DIAGNOSIS — M5442 Lumbago with sciatica, left side: Secondary | ICD-10-CM | POA: Diagnosis not present

## 2022-08-03 DIAGNOSIS — G8929 Other chronic pain: Secondary | ICD-10-CM

## 2022-08-03 LAB — POCT GLYCOSYLATED HEMOGLOBIN (HGB A1C): HbA1c, POC (controlled diabetic range): 8 % — AB (ref 0.0–7.0)

## 2022-08-03 LAB — GLUCOSE, POCT (MANUAL RESULT ENTRY): POC Glucose: 89 mg/dl (ref 70–99)

## 2022-08-03 MED ORDER — INSULIN GLARGINE 100 UNITS/ML SOLOSTAR PEN
38.0000 [IU] | PEN_INJECTOR | Freq: Every day | SUBCUTANEOUS | 4 refills | Status: DC
Start: 2022-08-03 — End: 2022-12-07
  Filled 2022-08-03 – 2022-09-02 (×2): qty 15, 39d supply, fill #0
  Filled 2022-10-10: qty 15, 39d supply, fill #1
  Filled 2022-11-15 – 2022-11-16 (×2): qty 15, 39d supply, fill #2

## 2022-08-03 MED ORDER — AMLODIPINE BESYLATE 5 MG PO TABS
5.0000 mg | ORAL_TABLET | Freq: Every day | ORAL | 2 refills | Status: DC
  Filled 2022-08-03: qty 90, 90d supply, fill #0
  Filled 2022-10-27 – 2022-11-16 (×3): qty 90, 90d supply, fill #1

## 2022-08-03 MED ORDER — CEFDINIR 300 MG PO CAPS
300.0000 mg | ORAL_CAPSULE | Freq: Two times a day (BID) | ORAL | 0 refills | Status: AC
  Filled 2022-08-03: qty 14, 7d supply, fill #0

## 2022-08-03 MED ORDER — INSULIN LISPRO (1 UNIT DIAL) 100 UNIT/ML (KWIKPEN)
10.0000 [IU] | PEN_INJECTOR | Freq: Three times a day (TID) | SUBCUTANEOUS | 1 refills | Status: DC
Start: 2022-08-03 — End: 2022-11-15
  Filled 2022-08-03: qty 15, 50d supply, fill #0
  Filled 2022-10-04: qty 15, 50d supply, fill #1

## 2022-08-03 MED ORDER — HYDROXYZINE HCL 50 MG PO TABS
50.0000 mg | ORAL_TABLET | Freq: Three times a day (TID) | ORAL | 2 refills | Status: DC
  Filled 2022-08-03: qty 90, 30d supply, fill #0
  Filled 2022-10-27 – 2022-11-15 (×2): qty 90, 30d supply, fill #1

## 2022-08-03 MED ORDER — BENZONATATE 100 MG PO CAPS
100.0000 mg | ORAL_CAPSULE | Freq: Three times a day (TID) | ORAL | 1 refills | Status: DC | PRN
  Filled 2022-08-03: qty 40, 14d supply, fill #0

## 2022-08-03 MED ORDER — IPRATROPIUM-ALBUTEROL 0.5-2.5 (3) MG/3ML IN SOLN
3.0000 mL | Freq: Four times a day (QID) | RESPIRATORY_TRACT | 1 refills | Status: DC | PRN
Start: 2022-08-03 — End: 2022-11-15
  Filled 2022-08-03: qty 180, 15d supply, fill #0
  Filled 2022-09-27: qty 180, 15d supply, fill #1

## 2022-08-03 MED ORDER — DICLOFENAC SODIUM 50 MG PO TBEC
50.0000 mg | DELAYED_RELEASE_TABLET | Freq: Two times a day (BID) | ORAL | 2 refills | Status: DC | PRN
Start: 2022-08-03 — End: 2022-11-15
  Filled 2022-08-03: qty 60, fill #0
  Filled 2022-08-23: qty 60, 30d supply, fill #0
  Filled 2022-09-22: qty 60, 30d supply, fill #1
  Filled 2022-10-21: qty 60, 30d supply, fill #2

## 2022-08-03 MED ORDER — GABAPENTIN 300 MG PO CAPS
600.0000 mg | ORAL_CAPSULE | Freq: Three times a day (TID) | ORAL | 3 refills | Status: DC
Start: 2022-08-03 — End: 2022-11-15
  Filled 2022-08-03 – 2022-08-12 (×2): qty 180, 30d supply, fill #0
  Filled 2022-09-14: qty 180, 30d supply, fill #1
  Filled 2022-10-10: qty 180, 30d supply, fill #2
  Filled 2022-11-14: qty 180, 30d supply, fill #3

## 2022-08-03 MED ORDER — SUMATRIPTAN SUCCINATE 50 MG PO TABS
50.0000 mg | ORAL_TABLET | ORAL | 2 refills | Status: DC | PRN
Start: 2022-08-03 — End: 2022-11-15
  Filled 2022-08-03: qty 10, 1d supply, fill #0
  Filled 2022-09-02: qty 10, 30d supply, fill #0
  Filled 2022-10-04: qty 10, 30d supply, fill #1
  Filled 2022-11-04 (×2): qty 10, 30d supply, fill #2

## 2022-08-03 MED ORDER — VALSARTAN-HYDROCHLOROTHIAZIDE 320-25 MG PO TABS
1.0000 | ORAL_TABLET | Freq: Every day | ORAL | 3 refills | Status: DC
  Filled 2022-08-03 – 2022-08-23 (×2): qty 90, 90d supply, fill #0
  Filled 2022-11-15 – 2022-11-16 (×2): qty 90, 90d supply, fill #1

## 2022-08-03 MED ORDER — FLUTICASONE-SALMETEROL 250-50 MCG/ACT IN AEPB
1.0000 | INHALATION_SPRAY | Freq: Two times a day (BID) | RESPIRATORY_TRACT | 6 refills | Status: DC
  Filled 2022-08-03: qty 60, 30d supply, fill #0
  Filled 2022-09-02: qty 60, 30d supply, fill #1
  Filled 2022-10-04: qty 60, 30d supply, fill #2
  Filled 2022-11-14: qty 60, 30d supply, fill #3

## 2022-08-03 NOTE — Assessment & Plan Note (Signed)
Recurrent exacerbation will give cefdinir for 7-day course

## 2022-08-03 NOTE — Assessment & Plan Note (Signed)
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

## 2022-08-03 NOTE — Patient Instructions (Addendum)
Refills on all medications sent downstairs  Take cefdinir twice daily for 7 days as an antibiotic  No medication changes  A1c was 8.0 continue your insulin program  Return to Dr. Delford Field 4 months  Labs to obtain today

## 2022-08-03 NOTE — Assessment & Plan Note (Signed)
Has chronic pain agreement continue tramadol Pdmp checked

## 2022-08-03 NOTE — Assessment & Plan Note (Signed)
Stable as per mental health

## 2022-08-03 NOTE — Assessment & Plan Note (Signed)
Blood pressure well controlled no changes ?

## 2022-08-03 NOTE — Assessment & Plan Note (Signed)
A1c gradually reducing continue with insulin glargine and insulin lispro

## 2022-08-03 NOTE — Assessment & Plan Note (Signed)
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

## 2022-08-04 ENCOUNTER — Other Ambulatory Visit: Payer: Self-pay

## 2022-08-04 LAB — MICROALBUMIN / CREATININE URINE RATIO
Creatinine, Urine: 50.1 mg/dL
Microalb/Creat Ratio: 7 mg/g creat (ref 0–29)
Microalbumin, Urine: 3.6 ug/mL

## 2022-08-04 LAB — BMP8+EGFR
BUN/Creatinine Ratio: 18 (ref 10–24)
BUN: 24 mg/dL (ref 8–27)
CO2: 22 mmol/L (ref 20–29)
Calcium: 9.9 mg/dL (ref 8.6–10.2)
Chloride: 97 mmol/L (ref 96–106)
Creatinine, Ser: 1.32 mg/dL — ABNORMAL HIGH (ref 0.76–1.27)
Glucose: 77 mg/dL (ref 70–99)
Potassium: 4.8 mmol/L (ref 3.5–5.2)
Sodium: 134 mmol/L (ref 134–144)
eGFR: 60 mL/min/{1.73_m2} (ref >59)

## 2022-08-04 LAB — LIPID PANEL
Chol/HDL Ratio: 3.5 ratio (ref 0.0–5.0)
Cholesterol, Total: 175 mg/dL (ref 100–199)
HDL: 50 mg/dL (ref >39)
LDL Chol Calc (NIH): 96 mg/dL (ref 0–99)
Triglycerides: 171 mg/dL — ABNORMAL HIGH (ref 0–149)
VLDL Cholesterol Cal: 29 mg/dL (ref 5–40)

## 2022-08-05 NOTE — Progress Notes (Signed)
Let pt know diabetes is controlled , no protein in urine, cholesterol at goal

## 2022-08-06 ENCOUNTER — Telehealth (INDEPENDENT_AMBULATORY_CARE_PROVIDER_SITE_OTHER): Payer: Self-pay

## 2022-08-06 NOTE — Telephone Encounter (Signed)
Pt was called and vm was left, Information has been sent to nurse pool.   

## 2022-08-06 NOTE — Telephone Encounter (Signed)
-----   Message from Storm Frisk, MD sent at 08/05/2022  6:06 AM EDT ----- Let pt know diabetes is controlled , no protein in urine, cholesterol at goal

## 2022-08-12 ENCOUNTER — Other Ambulatory Visit: Payer: Self-pay

## 2022-08-24 ENCOUNTER — Other Ambulatory Visit: Payer: Self-pay | Admitting: Critical Care Medicine

## 2022-08-24 ENCOUNTER — Other Ambulatory Visit: Payer: Self-pay

## 2022-08-24 DIAGNOSIS — M545 Low back pain, unspecified: Secondary | ICD-10-CM

## 2022-08-24 DIAGNOSIS — G894 Chronic pain syndrome: Secondary | ICD-10-CM

## 2022-08-25 ENCOUNTER — Other Ambulatory Visit: Payer: Self-pay

## 2022-08-25 ENCOUNTER — Other Ambulatory Visit: Payer: Self-pay | Admitting: Physician Assistant

## 2022-08-25 DIAGNOSIS — M545 Low back pain, unspecified: Secondary | ICD-10-CM

## 2022-08-25 DIAGNOSIS — G894 Chronic pain syndrome: Secondary | ICD-10-CM

## 2022-08-25 MED ORDER — TRAMADOL HCL 50 MG PO TABS
50.0000 mg | ORAL_TABLET | Freq: Four times a day (QID) | ORAL | 0 refills | Status: DC | PRN
Start: 2022-08-25 — End: 2022-09-28

## 2022-08-25 NOTE — Progress Notes (Signed)
Check of [place] controlled substance registry appropriate.  Last filled by Dr. Delford Field on July 28, 2022.  Roney Jaffe, PA-C Physician Assistant [facility] [email or web address]

## 2022-09-02 ENCOUNTER — Other Ambulatory Visit: Payer: Self-pay

## 2022-09-03 ENCOUNTER — Other Ambulatory Visit: Payer: Self-pay

## 2022-09-14 ENCOUNTER — Other Ambulatory Visit: Payer: Self-pay

## 2022-09-27 ENCOUNTER — Other Ambulatory Visit: Payer: Self-pay | Admitting: Physician Assistant

## 2022-09-27 DIAGNOSIS — M545 Low back pain, unspecified: Secondary | ICD-10-CM

## 2022-09-27 DIAGNOSIS — G894 Chronic pain syndrome: Secondary | ICD-10-CM

## 2022-09-28 ENCOUNTER — Other Ambulatory Visit: Payer: Self-pay | Admitting: Critical Care Medicine

## 2022-09-28 DIAGNOSIS — M545 Low back pain, unspecified: Secondary | ICD-10-CM

## 2022-09-28 DIAGNOSIS — G894 Chronic pain syndrome: Secondary | ICD-10-CM

## 2022-09-28 NOTE — Telephone Encounter (Signed)
Medication Refill - Medication: traMADol (ULTRAM) 50 MG tablet   Has the patient contacted their pharmacy? no  Preferred Pharmacy (with phone number or street name):   [facility]  [address], [address] [place] [address]  Phone: [phone] Fax: [phone]  Hours: M-F 7:30a-6:00p     Has the patient been seen for an appointment in the last year OR does the patient have an upcoming appointment? Yes.    Agent: Please be advised that RX refills may take up to 3 business days. We ask that you follow-up with your pharmacy.

## 2022-09-28 NOTE — Telephone Encounter (Signed)
Requested medications are due for refill today.  unsure  Requested medications are on the active medications list.  yes  Last refill. 08/25/2022 #120 0 rf  Future visit scheduled.   yes  Notes to clinic.  Refill/refusal not delegated.    Requested Prescriptions  Pending Prescriptions Disp Refills   traMADol (ULTRAM) 50 MG tablet 120 tablet 0    Sig: Take 1 tablet (50 mg total) by mouth every 6 (six) hours as needed for moderate pain or severe pain.     Not Delegated - Analgesics:  Opioid Agonists Failed - 09/28/2022  8:51 AM      Failed - This refill cannot be delegated      Failed - Urine Drug Screen completed in last 360 days      Passed - Valid encounter within last 3 months    Recent Outpatient Visits           1 month ago Type 2 diabetes mellitus with hyperglycemia, with long-term current use of insulin [facility])   [facility] Storm Frisk, MD   4 months ago Type 2 diabetes mellitus with hyperglycemia, with long-term current use of insulin [facility])   [place] [facility] [place], [place], [place]   6 months ago Type 2 diabetes mellitus with hyperglycemia, with long-term current use of insulin [facility])   [facility] [place], [place] L, RPH-CPP   7 months ago Type 2 diabetes mellitus with hyperglycemia, with long-term current use of insulin [facility])   [place] [facility] [place], [place] L, RPH-CPP   7 months ago Type 2 diabetes mellitus with hyperglycemia, with long-term current use of insulin [facility])   [place] Marcine Matar, MD       Future Appointments             In 2 months Delford Field Charlcie Cradle, MD [facility]

## 2022-09-29 ENCOUNTER — Other Ambulatory Visit: Payer: Self-pay

## 2022-09-29 ENCOUNTER — Telehealth: Payer: Self-pay

## 2022-09-29 ENCOUNTER — Telehealth: Payer: Self-pay | Admitting: Critical Care Medicine

## 2022-09-29 ENCOUNTER — Ambulatory Visit: Payer: 59 | Attending: Critical Care Medicine

## 2022-09-29 VITALS — Ht 69.0 in | Wt 259.0 lb

## 2022-09-29 DIAGNOSIS — Z0000 Encounter for general adult medical examination without abnormal findings: Secondary | ICD-10-CM | POA: Diagnosis not present

## 2022-09-29 MED ORDER — TRAMADOL HCL 50 MG PO TABS
50.0000 mg | ORAL_TABLET | Freq: Four times a day (QID) | ORAL | 0 refills | Status: DC | PRN
Start: 2022-09-29 — End: 2022-10-25
  Filled 2022-09-29 (×2): qty 120, 30d supply, fill #0

## 2022-09-29 NOTE — Patient Instructions (Addendum)
Joshua [name] , Thank you for taking time to come for your Medicare Wellness Visit. I appreciate your ongoing commitment to your health goals. Please review the following plan we discussed and let me know if I can assist you in the future.   These are the goals we discussed:  Goals      Decrease pain and increase mobility        This is a list of the screening recommended for you and due dates:  Health Maintenance  Topic Date Due   Cologuard (Stool DNA test)  Never done   Screening for Lung Cancer  Never done   Eye exam for diabetics  09/04/2021   Complete foot exam   09/30/2022   Hemoglobin A1C  02/02/2023   Yearly kidney function blood test for diabetes  08/03/2023   Yearly kidney health urinalysis for diabetes  08/03/2023   Medicare Annual Wellness Visit  09/29/2023   DTaP/Tdap/Td vaccine (2 - Td or Tdap) 10/15/2027   HPV Vaccine  Aged Out   Flu Shot  Discontinued   COVID-19 Vaccine  Discontinued   Hepatitis C Screening  Discontinued   HIV Screening  Discontinued   Zoster (Shingles) Vaccine  Discontinued    Advanced directives: Information on Advanced Care Planning can be found at [facility] of [facility] Advance Health Care Directives Advance Health Care Directives ([email or web address]  Please bring a copy of your health care power of attorney and living will to the office to be added to your chart at your convenience.   Conditions/risks identified: Aim for 30 minutes of exercise or brisk walking, 6-8 glasses of water, and 5 servings of fruits and vegetables each day.   Next appointment: Follow up in one year for your annual wellness visit   Preventive Care 40-64 Years, Male Preventive care refers to lifestyle choices and visits with your health care provider that can promote health and wellness. What does preventive care include? A yearly physical exam. This is also called an annual well check. Dental exams once or twice a year. Routine eye exams. Ask your health care  provider how often you should have your eyes checked. Personal lifestyle choices, including: Daily care of your teeth and gums. Regular physical activity. Eating a healthy diet. Avoiding tobacco and drug use. Limiting alcohol use. Practicing safe sex. Taking low-dose aspirin every day starting at age 36. What happens during an annual well check? The services and screenings done by your health care provider during your annual well check will depend on your age, overall health, lifestyle risk factors, and family history of disease. Counseling  Your health care provider may ask you questions about your: Alcohol use. Tobacco use. Drug use. Emotional well-being. Home and relationship well-being. Sexual activity. Eating habits. Work and work Astron[employer]. Screening  You may have the following tests or measurements: Height, weight, and BMI. Blood pressure. Lipid and cholesterol levels. These may be checked every 5 years, or more frequently if you are over 11 years old. Skin check. Lung cancer screening. You may have this screening every year starting at age 49 if you have a 30-pack-year history of smoking and currently smoke or have quit within the past 15 years. Fecal occult blood test (FOBT) of the stool. You may have this test every year starting at age 45. Flexible sigmoidoscopy or colonoscopy. You may have a sigmoidoscopy every 5 years or a colonoscopy every 10 years starting at age 16. Prostate cancer screening. Recommendations will vary depending on your family  history and other risks. Hepatitis C blood test. Hepatitis B blood test. Sexually transmitted disease (STD) testing. Diabetes screening. This is done by checking your blood sugar (glucose) after you have not eaten for a while (fasting). You may have this done every 1-3 years. Discuss your test results, treatment options, and if necessary, the need for more tests with your health care provider. Vaccines  Your health care  provider may recommend certain vaccines, such as: Influenza vaccine. This is recommended every year. Tetanus, diphtheria, and acellular pertussis (Tdap, Td) vaccine. You may need a Td booster every 10 years. Zoster vaccine. You may need this after age 36. Pneumococcal 13-valent conjugate (PCV13) vaccine. You may need this if you have certain conditions and have not been vaccinated. Pneumococcal polysaccharide (PPSV23) vaccine. You may need one or two doses if you smoke cigarettes or if you have certain conditions. Talk to your health care provider about which screenings and vaccines you need and how often you need them. This information is not intended to replace advice given to you by your health care provider. Make sure you discuss any questions you have with your health care provider. Document Released: 05/09/2015 Document Revised: 12/31/2015 Document Reviewed: 02/11/2015 Elsevier Interactive Patient Education  2017 [employer].  Fall Prevention in the Home Falls can cause injuries. They can happen to people of all ages. There are many things you can do to make your home safe and to help prevent falls. What can I do on the outside of my home? Regularly fix the edges of walkways and driveways and fix any cracks. Remove anything that might make you trip as you walk through a door, such as a raised step or threshold. Trim any bushes or trees on the path to your home. Use bright outdoor lighting. Clear any walking paths of anything that might make someone trip, such as rocks or tools. Regularly check to see if handrails are loose or broken. Make sure that both sides of any steps have handrails. Any raised decks and porches should have guardrails on the edges. Have any leaves, snow, or ice cleared regularly. Use sand or salt on walking paths during winter. Clean up any spills in your garage right away. This includes oil or grease spills. What can I do in the bathroom? Use night  lights. Install grab bars by the toilet and in the tub and shower. Do not use towel bars as grab bars. Use non-skid mats or decals in the tub or shower. If you need to sit down in the shower, use a plastic, non-slip stool. Keep the floor dry. Clean up any water that spills on the floor as soon as it happens. Remove soap buildup in the tub or shower regularly. Attach bath mats securely with double-sided non-slip rug tape. Do not have throw rugs and other things on the floor that can make you trip. What can I do in the bedroom? Use night lights. Make sure that you have a light by your bed that is easy to reach. Do not use any sheets or blankets that are too big for your bed. They should not hang down onto the floor. Have a firm chair that has side arms. You can use this for support while you get dressed. Do not have throw rugs and other things on the floor that can make you trip. What can I do in the kitchen? Clean up any spills right away. Avoid walking on wet floors. Keep items that you use a lot in  easy-to-reach places. If you need to reach something above you, use a strong step stool that has a grab bar. Keep electrical cords out of the way. Do not use floor polish or wax that makes floors slippery. If you must use wax, use non-skid floor wax. Do not have throw rugs and other things on the floor that can make you trip. What can I do with my stairs? Do not leave any items on the stairs. Make sure that there are handrails on both sides of the stairs and use them. Fix handrails that are broken or loose. Make sure that handrails are as long as the stairways. Check any carpeting to make sure that it is firmly attached to the stairs. Fix any carpet that is loose or worn. Avoid having throw rugs at the top or bottom of the stairs. If you do have throw rugs, attach them to the floor with carpet tape. Make sure that you have a light switch at the top of the stairs and the bottom of the stairs. If  you do not have them, ask someone to add them for you. What else can I do to help prevent falls? Wear shoes that: Do not have high heels. Have rubber bottoms. Are comfortable and fit you well. Are closed at the toe. Do not wear sandals. If you use a stepladder: Make sure that it is fully opened. Do not climb a closed stepladder. Make sure that both sides of the stepladder are locked into place. Ask someone to hold it for you, if possible. Clearly mark and make sure that you can see: Any grab bars or handrails. First and last steps. Where the edge of each step is. Use tools that help you move around (mobility aids) if they are needed. These include: Canes. Walkers. Scooters. Crutches. Turn on the lights when you go into a dark area. Replace any light bulbs as soon as they burn out. Set up your furniture so you have a clear path. Avoid moving your furniture around. If any of your floors are uneven, fix them. If there are any pets around you, be aware of where they are. Review your medicines with your doctor. Some medicines can make you feel dizzy. This can increase your chance of falling. Ask your doctor what other things that you can do to help prevent falls. This information is not intended to replace advice given to you by your health care provider. Make sure you discuss any questions you have with your health care provider. Document Released: 02/06/2009 Document Revised: 09/18/2015 Document Reviewed: 05/17/2014 Elsevier Interactive Patient Education  2017 [employer].

## 2022-09-29 NOTE — Telephone Encounter (Signed)
Patient is having a flare in back pain.   Is asking for Prednisone if possible and a referral for injection.

## 2022-09-29 NOTE — Telephone Encounter (Signed)
Call placed to patient unable to reach message left on VM.   

## 2022-09-29 NOTE — Telephone Encounter (Signed)
He will need an office visit to assess I am out Tuesday and Wednesday of next week may need to add him to Angela's schedule or send to [facility]

## 2022-09-29 NOTE — Telephone Encounter (Signed)
Spoke with patient . Verified name & DOB    Joshua [name] does not have any available for next week. Patient voiced that he wants to see Dr. Delford Field if possible. Patient give appointment for 10/13/2022. Patient voiced that this appointment is ok with him.

## 2022-09-29 NOTE — Telephone Encounter (Signed)
Copied from CRM [id number]. Topic: General - Other >> Sep 29, 2022  3:02 PM Santiya F wrote: Reason for CRM: Pt is calling in returning a call from the office.

## 2022-09-29 NOTE — Progress Notes (Signed)
Subjective:   Joshua [name] is a 67 y.o. male who presents for Medicare Annual/Subsequent preventive examination.  I connected with  Joshua [name] on 09/29/22 by a audio enabled telemedicine application and verified that I am speaking with the correct person using two identifiers.  Patient Location: Home  Provider Location: Home Office  I discussed the limitations of evaluation and management by telemedicine. The patient expressed understanding and agreed to proceed.  Review of Systems     Cardiac Risk Factors include: advanced age (>87men, >75 women);diabetes mellitus;smoking/ tobacco exposure;dyslipidemia;hypertension;male gender;sedentary lifestyle     Objective:    Today's Vitals   09/29/22 1157  Weight: 259 lb (117.5 kg)  Height: 5\' 9"  (1.753 m)  PainSc: 8    Body mass index is 38.25 kg/m.     09/29/2022   12:00 PM 10/01/2021    3:56 PM 08/23/2021    3:26 AM 07/13/2021    1:56 PM 11/04/2020    7:45 PM 03/06/2020   10:52 AM 08/04/2019    3:00 AM  Advanced Directives  Does Patient Have a Medical Advance Directive? No No No No No No Unable to assess, patient is non-responsive or altered mental status  Would patient like information on creating a medical advance directive? Yes (MAU/Ambulatory/Procedural Areas - Information given)   No - Patient declined No - Patient declined      Current Medications (verified) Outpatient Encounter Medications as of 09/29/2022  Medication Sig   Accu-Chek FastClix Lancets MISC Use to check blood sugars three times per day   acyclovir ointment (ZOVIRAX) 5 % Apply onto affected area of lips /peri oral area topically every 6 (six) hours.   albuterol (PROAIR HFA) 108 (90 Base) MCG/ACT inhaler Inhale 2 puffs into the lungs every 4 (four) hours as needed for wheezing or shortness of breath.   amLODipine (NORVASC) 5 MG tablet Take 1 tablet (5 mg total) by mouth daily.   benzonatate (TESSALON) 100 MG capsule Take 1 capsule (100 mg total) by  mouth 3 (three) times daily as needed.   blood glucose meter kit and supplies KIT Dispense based on patient and insurance preference. Use up to four times daily as directed. ICD-10 E11.65  Z79.4   Blood Glucose Monitoring Suppl (ACCU-CHEK GUIDE ME) w/Device KIT Use to check blood sugars up to 3 times per day. ICD-10 E11.65, Z79.4   Blood Pressure Monitoring (BLOOD PRESSURE KIT) DEVI Use to measure blood pressure   cetirizine (ZYRTEC) 10 MG tablet Take 1 tablet (10 mg total) by mouth daily.   clotrimazole (LOTRIMIN) 1 % cream Apply to affected area 2 times daily   diclofenac (VOLTAREN) 50 MG EC tablet Take 1 tablet (50 mg total) by mouth 2 (two) times daily as needed for moderate pain. Eat before taking the medicine.   fluticasone (FLONASE) 50 MCG/ACT nasal spray Place 2 sprays into both nostrils daily.   fluticasone-salmeterol (ADVAIR) 250-50 MCG/ACT AEPB Inhale 1 puff into the lungs 2 (two) times daily.   gabapentin (NEURONTIN) 300 MG capsule Take 2 capsules (600 mg total) by mouth 3 (three) times daily.   glucose blood (ACCU-CHEK GUIDE) test strip Use as instructed to check blood sugars three times per day.   hydrOXYzine (ATARAX) 50 MG tablet Take 1 tablet (50 mg total) by mouth 3 (three) times daily.   insulin glargine (LANTUS) 100 unit/mL SOPN Inject 38 Units into the skin at bedtime.   insulin lispro (HUMALOG KWIKPEN) 100 UNIT/ML KwikPen Inject 10 Units into the skin  3 (three) times daily.   Insulin Pen Needle (PEN NEEDLES) 32G X 4 MM MISC Use to inject insulin four times daily. Also, Ozempic once weekly.   ipratropium-albuterol (DUONEB) 0.5-2.5 (3) MG/3ML SOLN Take 3 mLs by nebulization every 6 (six) hours as needed (shortness of breath).   Lancets Misc. (ACCU-CHEK FASTCLIX LANCET) KIT Use to check blood sugars 3 times per day   mupirocin ointment (BACTROBAN) 2 % Apply 1 Application topically 2 (two) times daily.   omeprazole (PRILOSEC) 20 MG capsule Take 1 capsule (20 mg total) by mouth  daily.   paliperidone (INVEGA) 6 MG 24 hr tablet Take 6 mg by mouth at bedtime.   Respiratory Therapy Supplies (FLUTTER) DEVI Use with 4 times daily   Semaglutide, 2 MG/DOSE, 8 MG/3ML SOPN Inject 2 mg as directed once a week.   Skin Protectants, Misc. (EUCERIN) cream Apply topically as needed for dry skin.   Spacer/Aero-Holding Chambers (AEROCHAMBER MV) inhaler Use as instructed   SUMAtriptan (IMITREX) 50 MG tablet Take 1 tablet (50 mg total) by mouth every 2 (two) hours as needed for migraine. May repeat in 2 hours if headache persists or recurs.   traMADol (ULTRAM) 50 MG tablet Take 1 tablet (50 mg total) by mouth every 6 (six) hours as needed for moderate pain or severe pain.   valsartan-hydrochlorothiazide (DIOVAN-HCT) 320-25 MG tablet Take 1 tablet by mouth daily.   No facility-administered encounter medications on file as of 09/29/2022.    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   Chronic pain    Diabetes mellitus without complication (HCC)    Generalized anxiety disorder    Headache    Hypertension    Schizoaffective disorder (HCC)    Past Surgical History:  Procedure Laterality Date   APPENDECTOMY  1970   Family History  Problem Relation Age of Onset   Diabetes Mother    Cancer Mother        unsure what type   Heart disease Mother    Hypertension Mother    Dementia Father    Diabetes Brother    Dementia Maternal Grandfather    Cancer Paternal Grandmother 58       breast   Mental illness Neg Hx    Social History   Socioeconomic History   Marital status: Married    Spouse  name: Not on file   Number of children: Not on file   Years of education: Not on file   Highest education level: Not on file  Occupational History   Not on file  Tobacco Use   Smoking status: Every Day    Packs/day: 1.00    Years: 50.00    Additional pack years: 0.00    Total pack years: 50.00    Types: Cigarettes   Smokeless tobacco: Never  Vaping Use   Vaping Use: Never used  Substance and Sexual Activity   Alcohol use: Not Currently   Drug use: Yes    Types: Marijuana    Comment: "have changed to hemp now" 09/17/17   Sexual activity: Yes  Other Topics Concern   Not on file  Social History Narrative   Not  on file   Social Determinants of Health   Financial Resource Strain: Low Risk  (09/29/2022)   Overall Financial Resource Strain (CARDIA)    Difficulty of Paying Living Expenses: Not hard at all  Food Insecurity: No Food Insecurity (09/29/2022)   Hunger Vital Sign    Worried About Running Out of Food in the Last Year: Never true    Ran Out of Food in the Last Year: Never true  Transportation Needs: No Transportation Needs (09/29/2022)   PRAPARE - Administrator, Civil Service (Medical): No    Lack of Transportation (Non-Medical): No  Physical Activity: Inactive (09/29/2022)   Exercise Vital Sign    Days of Exercise per Week: 0 days    Minutes of Exercise per Session: 0 min  Stress: No Stress Concern Present (09/29/2022)   [employer] of Occupational Health - Occupational Stress Questionnaire    Feeling of Stress : Only a little  Social Connections: Moderately Isolated (09/29/2022)   Social Connection and Isolation Panel [NHANES]    Frequency of Communication with Friends and Family: More than three times a week    Frequency of Social Gatherings with Friends and Family: Three times a week    Attends Religious Services: Never    Active Member of Clubs or Organizations: No    Attends Banker Meetings: Never    Marital Status: Married    Tobacco  Counseling Ready to quit: Not Answered Counseling given: Not Answered   Clinical Intake:  Pre-visit preparation completed: Yes  Pain : 0-10 Pain Score: 8  Pain Type: Chronic pain Pain Location: Hip Pain Orientation: Left Pain Descriptors / Indicators: Stabbing Pain Onset: More than a month ago Pain Frequency: Constant     Diabetes: Yes CBG done?: No Did pt. bring in CBG monitor from home?: No  How often do you need to have someone help you when you read instructions, pamphlets, or other written materials from your doctor or pharmacy?: 1 - Never  Diabetic?Yes   Nutrition Risk Assessment:  Has the patient had any N/V/D within the last 2 months?  No  Does the patient have any non-healing wounds?  No  Has the patient had any unintentional weight loss or weight gain?  No   Diabetes:  Is the patient diabetic?  Yes  If diabetic, was a CBG obtained today?  No  Did the patient bring in their glucometer from home?  No  How often do you monitor your CBG's? Daily .   Financial Strains and Diabetes Management:  Are you having any financial strains with the device, your supplies or your medication? No .  Does the patient want to be seen by Chronic Care Management for management of their diabetes?  No  Would the patient like to be referred to a Nutritionist or for Diabetic Management?  No   Diabetic Exams:  Diabetic Eye Exam: Overdue for diabetic eye exam. Pt has been advised about the importance in completing this exam. Patient advised to call and schedule an eye exam. Diabetic Foot Exam: Completed 09/29/21   Interpreter Needed?: No  Information entered by :: Kandis Fantasia LPN   Activities of Daily Living    09/29/2022   12:00 PM 10/01/2021    3:49 PM  In your present state of health, do you have any difficulty performing the following activities:  Hearing? 0 0  Vision? 0 1  Difficulty concentrating or making decisions? 0 0  Walking or climbing stairs? 1 1  Dressing  or bathing? 0 0  Doing errands, shopping? 1 0  Preparing Food and eating ? N N  Using the Toilet? N N  In the past six months, have you accidently leaked urine? N   Do you have problems with loss of bowel control? N   Managing your Medications? N N  Managing your Finances? N N  Housekeeping or managing your Housekeeping? Y N    Patient Care Team: Storm Frisk, MD as PCP - General (Pulmonary Disease)  Indicate any recent Medical Services you may have received from other than [facility] providers in the past year (date may be approximate).     Assessment:   This is a routine wellness examination for Ahnaf.  Hearing/Vision screen Hearing Screening - Comments:: Denies hearing difficulties   Vision Screening - Comments:: No vision problems; will schedule routine eye exam soon    Dietary issues and exercise activities discussed: Current Exercise Habits: The patient does not participate in regular exercise at present, Exercise limited by: orthopedic condition(s)   Goals Addressed             This Visit's Progress    Decrease pain and increase mobility         Depression Screen    09/29/2022   12:01 PM 08/03/2022    2:25 PM 05/03/2022    2:41 PM 02/11/2022   10:41 AM 11/02/2021    2:42 PM 10/01/2021    3:32 PM 09/29/2021    2:57 PM  PHQ 2/9 Scores  PHQ - 2 Score 0 0 0 2 0 0 0  PHQ- 9 Score 2 1  5        Fall Risk    09/29/2022   11:59 AM 08/03/2022    2:25 PM 05/03/2022    2:41 PM 02/11/2022   10:40 AM 12/09/2021    8:49 AM  Fall Risk   Falls in the past year? 0 0 0 0 0  Number falls in past yr: 0 0 0 0 0  Injury with Fall? 0 0 0 0 0  Risk for fall due to : Impaired balance/gait;Impaired mobility No Fall Risks No Fall Risks Impaired mobility;Impaired balance/gait No Fall Risks  Follow up Falls prevention discussed;Education provided;Falls evaluation completed   Falls evaluation completed     FALL RISK PREVENTION PERTAINING TO THE HOME:  Any stairs in or around the home?  No  If so, are there any without handrails? No  Home free of loose throw rugs in walkways, pet beds, electrical cords, etc? Yes  Adequate lighting in your home to reduce risk of falls? Yes   ASSISTIVE DEVICES UTILIZED TO PREVENT FALLS:  Life alert? No  Use of a cane, walker or w/c? Yes  Grab bars in the bathroom? Yes  Shower chair or bench in shower? No  Elevated toilet seat or a handicapped toilet? Yes   TIMED UP AND GO:  Was the test performed? No . Telephonic visit   Cognitive Function:        09/29/2022   12:00 PM 10/01/2021    3:56 PM  6CIT Screen  What Year? 0 points 0 points  What month? 0 points 0 points  What time? 0 points 0 points  Count back from 20 0 points 0 points  Months in reverse 0 points 0 points  Repeat phrase 0 points 0 points  Total Score 0 points 0 points    Immunizations Immunization History  Administered Date(s) Administered   Influenza,inj,Quad PF,6+ Mos  02/14/2017, 02/23/2021   Moderna SARS-COV2 Booster Vaccination 06/18/2020   Moderna Sars-Covid-2 Vaccination 11/21/2019, 12/19/2019   PNEUMOCOCCAL CONJUGATE-20 04/28/2021   Pneumococcal Polysaccharide-23 07/15/2020   Tdap 10/14/2017    TDAP status: Up to date  Pneumococcal vaccine status: Up to date  Covid-19 vaccine status: Information provided on how to obtain vaccines.   Qualifies for Shingles Vaccine? Yes   Zostavax completed No   Shingrix Completed?: No.    Education has been provided regarding the importance of this vaccine. Patient has been advised to call insurance company to determine out of pocket expense if they have not yet received this vaccine. Advised may also receive vaccine at local pharmacy or Health Dept. Verbalized acceptance and understanding.  Screening Tests Health Maintenance  Topic Date Due   Fecal DNA (Cologuard)  Never done   Lung Cancer Screening  Never done   OPHTHALMOLOGY EXAM  09/04/2021   FOOT EXAM  09/30/2022   HEMOGLOBIN A1C  02/02/2023   Diabetic  kidney evaluation - eGFR measurement  08/03/2023   Diabetic kidney evaluation - Urine ACR  08/03/2023   Medicare Annual Wellness (AWV)  09/29/2023   DTaP/Tdap/Td (2 - Td or Tdap) 10/15/2027   HPV VACCINES  Aged Out   INFLUENZA VACCINE  Discontinued   COVID-19 Vaccine  Discontinued   Hepatitis C Screening  Discontinued   HIV Screening  Discontinued   Zoster Vaccines- Shingrix  Discontinued    Health Maintenance  Health Maintenance Due  Topic Date Due   Fecal DNA (Cologuard)  Never done   Lung Cancer Screening  Never done   OPHTHALMOLOGY EXAM  09/04/2021    Colorectal cancer screening:  Cologuard ordered today  Lung Cancer Screening: (Low Dose CT Chest recommended if Age 48-80 years, 30 pack-year currently smoking OR have quit w/in 15years.) does qualify.   Lung Cancer Screening Referral: will discuss with pcp  Additional Screening:  Hepatitis C Screening: does qualify; Declines at this time   Vision Screening: Recommended annual ophthalmology exams for early detection of glaucoma and other disorders of the eye. Is the patient up to date with their annual eye exam?  No  Who is the provider or what is the name of the office in which the patient attends annual eye exams? none If pt is not established with a provider, would they like to be referred to a provider to establish care? No .   Dental Screening: Recommended annual dental exams for proper oral hygiene  Community Resource Referral / Chronic Care Management: CRR required this visit?  No   CCM required this visit?  No      Plan:     I have personally reviewed and noted the following in the patient's chart:   Medical and social history Use of alcohol, tobacco or illicit drugs  Current medications and supplements including opioid prescriptions. Patient is currently taking opioid prescriptions. Information provided to patient regarding non-opioid alternatives. Patient advised to discuss non-opioid treatment plan with  their provider. Functional ability and status Nutritional status Physical activity Advanced directives List of other physicians Hospitalizations, surgeries, and ER visits in previous 12 months Vitals Screenings to include cognitive, depression, and falls Referrals and appointments  In addition, I have reviewed and discussed with patient certain preventive protocols, quality metrics, and best practice recommendations. A written personalized care plan for preventive services as well as general preventive health recommendations were provided to patient.     Kandis Fantasia [place], [place]   05/01/1094   Due to this  being a virtual visit, the after visit summary with patients personalized plan was offered to patient via mail or my-chart. Patient would like to access on my-chart  Nurse Notes: See telephone note

## 2022-09-29 NOTE — Telephone Encounter (Signed)
Please see previous encounter

## 2022-10-05 ENCOUNTER — Other Ambulatory Visit: Payer: Self-pay

## 2022-10-06 ENCOUNTER — Other Ambulatory Visit: Payer: Self-pay

## 2022-10-10 ENCOUNTER — Other Ambulatory Visit: Payer: Self-pay | Admitting: Critical Care Medicine

## 2022-10-11 ENCOUNTER — Other Ambulatory Visit: Payer: Self-pay

## 2022-10-11 MED ORDER — OMEPRAZOLE 20 MG PO CPDR
20.0000 mg | DELAYED_RELEASE_CAPSULE | Freq: Every day | ORAL | 0 refills | Status: DC
  Filled 2022-10-11: qty 30, 30d supply, fill #0

## 2022-10-13 ENCOUNTER — Ambulatory Visit: Payer: 59 | Admitting: Critical Care Medicine

## 2022-10-22 ENCOUNTER — Other Ambulatory Visit: Payer: Self-pay

## 2022-10-25 ENCOUNTER — Other Ambulatory Visit: Payer: Self-pay | Admitting: Critical Care Medicine

## 2022-10-25 DIAGNOSIS — M545 Low back pain, unspecified: Secondary | ICD-10-CM

## 2022-10-25 DIAGNOSIS — G894 Chronic pain syndrome: Secondary | ICD-10-CM

## 2022-10-26 MED ORDER — TRAMADOL HCL 50 MG PO TABS
50.0000 mg | ORAL_TABLET | Freq: Four times a day (QID) | ORAL | 0 refills | Status: DC | PRN
Start: 2022-10-26 — End: 2022-11-15
  Filled 2022-10-26: qty 120, 30d supply, fill #0

## 2022-10-26 NOTE — Telephone Encounter (Signed)
Requested medication (s) are due for refill today: yes  Requested medication (s) are on the active medication list: yes  Last refill:  09/29/22 #120/0  Future visit scheduled: yes  Notes to clinic:  Unable to refill per protocol, cannot delegate.    Requested Prescriptions  Pending Prescriptions Disp Refills   traMADol (ULTRAM) 50 MG tablet 120 tablet 0    Sig: Take 1 tablet (50 mg total) by mouth every 6 (six) hours as needed for moderate pain or severe pain.     Not Delegated - Analgesics:  Opioid Agonists Failed - 10/25/2022  2:30 PM      Failed - This refill cannot be delegated      Failed - Urine Drug Screen completed in last 360 days      Passed - Valid encounter within last 3 months    Recent Outpatient Visits           2 months ago Type 2 diabetes mellitus with hyperglycemia, with long-term current use of insulin [facility])   [place] [facility] Storm Frisk, MD   5 months ago Type 2 diabetes mellitus with hyperglycemia, with long-term current use of insulin [facility])   [place] [facility] [place], [place], [place]   6 months ago Type 2 diabetes mellitus with hyperglycemia, with long-term current use of insulin [facility])   [place] [facility] [place], [place] L, RPH-CPP   8 months ago Type 2 diabetes mellitus with hyperglycemia, with long-term current use of insulin [facility])   [place] [facility] [place], [place] L, RPH-CPP   8 months ago Type 2 diabetes mellitus with hyperglycemia, with long-term current use of insulin [facility])   [place] [facility] Marcine Matar, MD       Future Appointments             In 1 month [place] Charlcie Cradle, MD [facility]

## 2022-10-27 ENCOUNTER — Other Ambulatory Visit: Payer: Self-pay

## 2022-11-02 ENCOUNTER — Other Ambulatory Visit: Payer: Self-pay

## 2022-11-04 ENCOUNTER — Other Ambulatory Visit: Payer: Self-pay

## 2022-11-05 ENCOUNTER — Other Ambulatory Visit: Payer: Self-pay

## 2022-11-15 ENCOUNTER — Other Ambulatory Visit: Payer: Self-pay

## 2022-11-15 ENCOUNTER — Other Ambulatory Visit: Payer: Self-pay | Admitting: Critical Care Medicine

## 2022-11-15 DIAGNOSIS — G894 Chronic pain syndrome: Secondary | ICD-10-CM

## 2022-11-15 DIAGNOSIS — G8929 Other chronic pain: Secondary | ICD-10-CM

## 2022-11-15 DIAGNOSIS — M545 Low back pain, unspecified: Secondary | ICD-10-CM

## 2022-11-15 DIAGNOSIS — G629 Polyneuropathy, unspecified: Secondary | ICD-10-CM

## 2022-11-15 DIAGNOSIS — E1165 Type 2 diabetes mellitus with hyperglycemia: Secondary | ICD-10-CM

## 2022-11-15 DIAGNOSIS — G43009 Migraine without aura, not intractable, without status migrainosus: Secondary | ICD-10-CM

## 2022-11-15 DIAGNOSIS — J4489 Other specified chronic obstructive pulmonary disease: Secondary | ICD-10-CM

## 2022-11-16 ENCOUNTER — Other Ambulatory Visit: Payer: Self-pay

## 2022-11-16 MED ORDER — OMEPRAZOLE 20 MG PO CPDR
20.0000 mg | DELAYED_RELEASE_CAPSULE | Freq: Every day | ORAL | 0 refills | Status: DC
  Filled 2022-11-16: qty 30, 30d supply, fill #0

## 2022-11-16 MED ORDER — PEN NEEDLES 32G X 4 MM MISC
0 refills | Status: DC
Start: 2022-11-16 — End: 2022-12-07
  Filled 2022-11-16: qty 100, 25d supply, fill #0

## 2022-11-16 MED ORDER — IPRATROPIUM-ALBUTEROL 0.5-2.5 (3) MG/3ML IN SOLN
3.0000 mL | Freq: Four times a day (QID) | RESPIRATORY_TRACT | 1 refills | Status: DC | PRN
Start: 2022-11-16 — End: 2022-12-07
  Filled 2022-11-16: qty 180, 15d supply, fill #0

## 2022-11-16 MED ORDER — INSULIN LISPRO (1 UNIT DIAL) 100 UNIT/ML (KWIKPEN)
10.0000 [IU] | PEN_INJECTOR | Freq: Three times a day (TID) | SUBCUTANEOUS | 0 refills | Status: DC
Start: 2022-11-16 — End: 2022-12-07
  Filled 2022-11-16: qty 15, 50d supply, fill #0

## 2022-11-16 MED ORDER — DICLOFENAC SODIUM 50 MG PO TBEC
50.0000 mg | DELAYED_RELEASE_TABLET | Freq: Two times a day (BID) | ORAL | 0 refills | Status: DC | PRN
Start: 2022-11-16 — End: 2022-12-07
  Filled 2022-11-16: qty 60, 30d supply, fill #0

## 2022-11-16 MED ORDER — SUMATRIPTAN SUCCINATE 50 MG PO TABS
50.0000 mg | ORAL_TABLET | ORAL | 0 refills | Status: DC | PRN
Start: 2022-11-16 — End: 2022-12-07
  Filled 2022-11-16: qty 10, 1d supply, fill #0

## 2022-11-17 ENCOUNTER — Other Ambulatory Visit: Payer: Self-pay

## 2022-11-17 MED ORDER — TRAMADOL HCL 50 MG PO TABS
50.0000 mg | ORAL_TABLET | Freq: Four times a day (QID) | ORAL | 0 refills | Status: DC | PRN
Start: 2022-11-17 — End: 2022-12-07
  Filled 2022-11-17 – 2022-11-23 (×2): qty 120, 30d supply, fill #0

## 2022-11-17 MED ORDER — GABAPENTIN 300 MG PO CAPS
600.0000 mg | ORAL_CAPSULE | Freq: Three times a day (TID) | ORAL | 3 refills | Status: DC
Start: 2022-11-17 — End: 2022-12-07
  Filled 2022-11-17: qty 180, 30d supply, fill #0

## 2022-11-19 ENCOUNTER — Other Ambulatory Visit: Payer: Self-pay

## 2022-11-23 ENCOUNTER — Other Ambulatory Visit: Payer: Self-pay

## 2022-12-06 NOTE — Progress Notes (Unsigned)
Established Patient Office Visit  Subjective:  Patient ID: Joshua [name], male    DOB: [birth year]  Age: 67 y.o. MRN: [id number]  Chief Complaint  Patient presents with   Medication Refill   Leg Pain     HPI 04/28/21 Joshua [name] presents for primary care follow-up for diabetes and hypertension.  Patient continues to smoke 5 to 6 cigarettes daily at this time.  He has not been compliant with his diet occasionally his fasting sugars are 265-220.  When I review his glucometer his blood sugar ranges anywhere from 1 80-2 10 in the morning fasting.  He self increased his insulin glargine to 30 units a day.  He ran out of refills and cannot get an early refill with this medication.  The patient wishes to hold off on his pneumonia vaccine at this visit.  Patient does have a Cologuard kit at home he states he will have this process soon.  Patient is needing a new nebulizer cup and mouthpiece for his nebulizer machine.  He does maintain the Trulicity weekly along with the insulin glargine and the Jardiance 25 mg daily note he cannot tolerate metformin due to side effects.  Patient has a chronic pain management contract that he has been compliant with and needs refills on tramadol.  Drug database was checked and showed no frequent multiple prescribers no evidence of drug diversion  Patient's mental health status is stable at this time he is on monthly Invega injections at this time  4/17 Patient returns for primary care follow-up with complaints of pain in the right and left lower extremity.   Patient with history of significant delusion.  He thinks he has been bit by [name].  Patient also is concerned about tick infestation. Patient is followed by [name] and receives [name] injections on a monthly basis.  He would like to receive these here but was explained that he needs to stay with his mental health provider for same.  Patient is having increased wheezing and productive cough of  green mucus.  He is still actively smoking.  09/01/21 Patient is seen as a work in post ER visit.  He was sent by the police upon urging of his spouse April 30 to the emergency room at [facility] for evaluation by psychiatry.  He had wildly psychotic ideation.  He thought he was Scientist, physiological.  He was screaming out at neighbors and bothering him.  Patient was seen by psychiatry no intervention made and was declared to not be a threat to himself or others and was discharged home.  He has since been to [place] and receives his monthly Invega injection although at a lower dose. The patient has been referred to the below mental Health Center but the patient's spouse is unclear where to go and needs this information to make the appointment.  The patient is here today by himself. [facility] [address], [place], [place] [address] [phone] Your referral was sent here for therapy and psychiatry  Today the patient is fairly cooperative however he is definitely psychotic he thinks he sees ghosts and snakes and sees things moving in the room.  He is cooperative and is not suicidal or homicidal.  Blood pressure initially on arrival was 170/105 but on recheck was 151/96.  He is only on the valsartan 80 mg daily. Patient is still actively smoking  6/6 Patient seen in return follow-up and states he is having increased sore throat  increased cough productive of brown mucus shortness of breath as well.  He is still smoking about a half a pack a day of cigarettes.  He states his blood sugars improved on Ozempic and he is lost some weight.  Blood pressure does remain elevated 168/99.  Patient does maintain valsartan HCT 160.  Blood pressure meter is needed for the patient.  He would also like a refill on Prilosec.  There are no other complaints. The patient elected to return to [facility] for his Invega injections and is improved mentally  7/10 Patient returns with increased cough  productive of white to yellow-green mucus he took a course of cefdinir with some improvement now has worsened on arrival blood pressure elevated 151/94 on repeat is 148/92 patient does continue with his Invega injections monthly at [facility] patient notes some wheezing  8/16 Patient is seen in return follow-up from a COPD perspective he is breathing better he is coughing less wheezing less.  His smoking is down to half a pack a day of cigarettes.  On arrival blood pressure is good 128/78 but blood sugar is 225.  Patient is been on the 0.5 mg weekly Ozempic at this time.  He cannot take statins and he has elevated cholesterol.  Patient needs follow-up metabolic panel and lipid panel at this visit. Patient still has chronic pain in both hips and knees.  08/03/22 Saw McClung 05/03/22  1. Type 2 diabetes mellitus with hyperglycemia, with long-term current use of insulin (HCC) Improved-only on higher dose of ozempic for 4 weeks-hopefully will continue improving - Glucose (CBG) - HgB A1c - Semaglutide, 2 MG/DOSE, 8 MG/3ML SOPN; Inject 3 mg as directed once a week.  Dispense: 9 mL; Refill: 2 - insulin lispro (HUMALOG KWIKPEN) 100 UNIT/ML KwikPen; Inject 10 Units into the skin 3 (three) times daily.  Dispense: 15 mL; Refill: 1 - Comprehensive metabolic panel   2. Chronic low back pain with left-sided sciatica, unspecified back pain laterality - meloxicam (MOBIC) 15 MG tablet; Take 1 tablet (15 mg total) by mouth daily.  Dispense: 30 tablet; Refill: 2 - gabapentin (NEURONTIN) 300 MG capsule; Take 2 capsules (600 mg total) by mouth 3 (three) times daily.  Dispense: 180 capsule; Refill: 3   3. Neuropathy - gabapentin (NEURONTIN) 300 MG capsule; Take 2 capsules (600 mg total) by mouth 3 (three) times daily.  Dispense: 180 capsule; Refill: 3   4. Migraine without aura and without status migrainosus, not intractable - SUMAtriptan (IMITREX) 50 MG tablet; Take 1 tablet (50 mg total) by mouth every 2 (two) hours  as needed for migraine. May repeat in 2 hours if headache persists or recurs.  Dispense: 10 tablet; Refill: 2   5. COPD with chronic bronchitis - ipratropium-albuterol (DUONEB) 0.5-2.5 (3) MG/3ML SOLN; Take 3 mLs by nebulization every 6 (six) hours as needed (shortness of breath).  Dispense: 120 mL; Refill: 1   Patient seen return follow-up and has not been seen since August of last year.  Patient was seen in January by PA Mcclung and had medications refilled.  He is now noting increased shortness of breath and cough.  He is having increased wheezing.  Blood sugar on arrival is 89 A1c is 8.0.  Blood pressure 132/72.  Patient has no other complaints.  Pain is well-controlled.  8/13 With patient seen in follow-up for COPD and diabetes.  He has fallen twice in the last week he has not had injuries.  He needs a foot and eye exam and is  still smoking.  He is coughing up bubbly white mucus.  He needs another Cologuard kit for colon cancer screening.  Needs refills on neuropathy medication.  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   Chronic pain    Diabetes mellitus without complication (HCC)    Generalized anxiety disorder    Headache    Hypertension    Schizoaffective disorder (HCC)     Past Surgical History:  Procedure Laterality Date   APPENDECTOMY  1970    Family History  Problem Relation Age of Onset   Diabetes Mother    Cancer Mother        unsure what type   Heart disease Mother    Hypertension Mother    Dementia Father    Diabetes Brother    Dementia Maternal Grandfather    Cancer Paternal Grandmother 4       breast   Mental illness Neg Hx     Social  History   Socioeconomic History   Marital status: Married    Spouse name: Not on file   Number of children: Not on file   Years of education: Not on file   Highest education level: 9th grade  Occupational History   Not on file  Tobacco Use   Smoking status: Every Day    Current packs/day: 1.00    Average packs/day: 1 pack/day for 50.0 years (50.0 ttl pk-yrs)    Types: Cigarettes   Smokeless tobacco: Never  Vaping Use   Vaping status: Never Used  Substance and Sexual Activity   Alcohol use: Not Currently   Drug use: Yes    Types: Marijuana    Comment: "have changed to hemp now" 09/17/17   Sexual activity: Yes  Other Topics Concern   Not on file  Social History Narrative   Not on file   Social Determinants of Health   Financial Resource Strain: Low Risk  (10/10/2022)   Overall Financial Resource Strain (CARDIA)    Difficulty of Paying Living Expenses: Not very hard  Food Insecurity: No Food Insecurity (10/10/2022)   Hunger Vital Sign    Worried About Running Out of Food in the Last Year: Never true    Ran Out of Food in the Last Year: Never true  Transportation Needs: No Transportation Needs (10/10/2022)   PRAPARE - Administrator, Civil Service (Medical): No    Lack of Transportation (Non-Medical): No  Physical Activity: Inactive (10/10/2022)   Exercise Vital Sign    Days of Exercise per Week: 0 days    Minutes of Exercise per Session: 0 min  Stress: No Stress Concern Present (10/10/2022)   [employer] of Occupational Health - Occupational Stress Questionnaire    Feeling of Stress : Not at all  Social Connections: Moderately Isolated (10/10/2022)   Social Connection and Isolation Panel [NHANES]    Frequency of Communication with Friends and Family: More than three times a week    Frequency of Social Gatherings with Friends and Family: More than three times a week    Attends Religious Services: Never    Database administrator or Organizations: No     Attends Banker Meetings: Never  Marital Status: Married  Catering manager Violence: Not At Risk (09/29/2022)   Humiliation, Afraid, Rape, and Kick questionnaire    Fear of Current or Ex-Partner: No    Emotionally Abused: No    Physically Abused: No    Sexually Abused: No    Outpatient Medications Prior to Visit  Medication Sig Dispense Refill   albuterol (PROAIR HFA) 108 (90 Base) MCG/ACT inhaler Inhale 2 puffs into the lungs every 4 (four) hours as needed for wheezing or shortness of breath. 8.5 g 3   blood glucose meter kit and supplies KIT Dispense based on patient and insurance preference. Use up to four times daily as directed. ICD-10 E11.65  Z79.4 1 each 0   Blood Glucose Monitoring Suppl (ACCU-CHEK GUIDE ME) w/Device KIT Use to check blood sugars up to 3 times per day. ICD-10 E11.65, Z79.4 1 kit 0   Blood Pressure Monitoring (BLOOD PRESSURE KIT) DEVI Use to measure blood pressure 1 each 0   Lancets Misc. (ACCU-CHEK FASTCLIX LANCET) KIT Use to check blood sugars 3 times per day 1 kit 11   paliperidone (INVEGA) 6 MG 24 hr tablet Take 6 mg by mouth at bedtime.     Respiratory Therapy Supplies (FLUTTER) DEVI Use with 4 times daily 1 each 0   Spacer/Aero-Holding Chambers (AEROCHAMBER MV) inhaler Use as instructed 1 each 0   Accu-Chek FastClix Lancets MISC Use to check blood sugars three times per day 102 each 11   acyclovir ointment (ZOVIRAX) 5 % Apply onto affected area of lips /peri oral area topically every 6 (six) hours. 30 g 0   amLODipine (NORVASC) 5 MG tablet Take 1 tablet (5 mg total) by mouth daily. 90 tablet 2   cetirizine (ZYRTEC) 10 MG tablet Take 1 tablet (10 mg total) by mouth daily. 30 tablet 11   clotrimazole (LOTRIMIN) 1 % cream Apply to affected area 2 times daily 15 g 0   diclofenac (VOLTAREN) 50 MG EC tablet Take 1 tablet (50 mg total) by mouth 2 (two) times daily as needed for moderate pain. Eat before taking the medicine. 60 tablet 0    fluticasone-salmeterol (ADVAIR) 250-50 MCG/ACT AEPB Inhale 1 puff into the lungs 2 (two) times daily. 60 each 6   gabapentin (NEURONTIN) 300 MG capsule Take 2 capsules (600 mg total) by mouth 3 (three) times daily. 180 capsule 3   glucose blood (ACCU-CHEK GUIDE) test strip Use as instructed to check blood sugars three times per day. 100 each 11   hydrOXYzine (ATARAX) 50 MG tablet Take 1 tablet (50 mg total) by mouth 3 (three) times daily. 90 tablet 2   insulin glargine (LANTUS) 100 unit/mL SOPN Inject 38 Units into the skin at bedtime. 15 mL 4   insulin lispro (HUMALOG KWIKPEN) 100 UNIT/ML KwikPen Inject 10 Units into the skin 3 (three) times daily. 15 mL 0   Insulin Pen Needle (PEN NEEDLES) 32G X 4 MM MISC Use to inject insulin four times daily. Also, Ozempic once weekly. 100 each 0   ipratropium-albuterol (DUONEB) 0.5-2.5 (3) MG/3ML SOLN Take 3 mLs by nebulization every 6 (six) hours as needed (shortness of breath). 180 mL 1   mupirocin ointment (BACTROBAN) 2 % Apply 1 Application topically 2 (two) times daily. 22 g 0   omeprazole (PRILOSEC) 20 MG capsule Take 1 capsule (20 mg total) by mouth daily. 30 capsule 0   Semaglutide, 2 MG/DOSE, 8 MG/3ML SOPN Inject 2 mg as directed once a week. 9 mL 2   Skin  Protectants, Misc. (EUCERIN) cream Apply topically as needed for dry skin. 454 g 0   SUMAtriptan (IMITREX) 50 MG tablet Take 1 tablet (50 mg total) by mouth every 2 (two) hours as needed for migraine. May repeat in 2 hours if headache persists or recurs. 10 tablet 0   traMADol (ULTRAM) 50 MG tablet Take 1 tablet (50 mg total) by mouth every 6 (six) hours as needed for moderate pain or severe pain. 120 tablet 0   valsartan-hydrochlorothiazide (DIOVAN-HCT) 320-25 MG tablet Take 1 tablet by mouth daily. 90 tablet 3   benzonatate (TESSALON) 100 MG capsule Take 1 capsule (100 mg total) by mouth 3 (three) times daily as needed. 40 capsule 1   fluticasone (FLONASE) 50 MCG/ACT nasal spray Place 2 sprays into  both nostrils daily. (Patient not taking: Reported on 12/07/2022) 16 g 6   No facility-administered medications prior to visit.    Allergies  Allergen Reactions   Statins Other (See Comments)    Myopathy   Haldol [Haloperidol] Other (See Comments)    Unk reaction   Metformin And Related Diarrhea and Nausea And Vomiting   Trazodone And Nefazodone Itching    ROS Review of Systems  Constitutional: Negative.   HENT:  Negative for ear pain, postnasal drip, rhinorrhea, sinus pressure, sore throat, trouble swallowing and voice change.   Eyes: Negative.   Respiratory:  Negative for apnea, cough, choking, chest tightness, shortness of breath, wheezing and stridor.   Cardiovascular: Negative.  Negative for chest pain, palpitations and leg swelling.  Gastrointestinal: Negative.  Negative for abdominal distention, abdominal pain, nausea and vomiting.  Genitourinary: Negative.   Musculoskeletal:  Positive for back pain. Negative for arthralgias and myalgias.  Skin: Negative.  Negative for rash.  Allergic/Immunologic: Negative.  Negative for environmental allergies and food allergies.  Neurological: Negative.  Negative for dizziness, syncope and weakness.  Hematological: Negative.  Negative for adenopathy. Does not bruise/bleed easily.  Psychiatric/Behavioral:  Negative for agitation, behavioral problems, confusion, decreased concentration, dysphoric mood, hallucinations, self-injury, sleep disturbance and suicidal ideas. The patient is not nervous/anxious and is not hyperactive.       Objective:    Physical Exam Vitals reviewed.  Constitutional:      Appearance: Normal appearance. He is well-developed. He is obese. He is not diaphoretic.  HENT:     Head: Normocephalic and atraumatic.     Nose: Nose normal. No nasal deformity, septal deviation, mucosal edema or rhinorrhea.     Right Sinus: No maxillary sinus tenderness or frontal sinus tenderness.     Left Sinus: No maxillary sinus  tenderness or frontal sinus tenderness.     Mouth/Throat:     Mouth: Mucous membranes are moist.     Pharynx: Oropharynx is clear. No oropharyngeal exudate.     Comments: Poor dentition with multiple dental caries Eyes:     General: No scleral icterus.    Conjunctiva/sclera: Conjunctivae normal.     Pupils: Pupils are equal, round, and reactive to light.  Neck:     Thyroid: No thyromegaly.     Vascular: No carotid bruit or JVD.     Trachea: Trachea normal. No tracheal tenderness or tracheal deviation.  Cardiovascular:     Rate and Rhythm: Normal rate and regular rhythm.     Chest Wall: PMI is not displaced.     Pulses: Normal pulses. No decreased pulses.     Heart sounds: Normal heart sounds, S1 normal and S2 normal. Heart sounds not distant. No murmur heard.  No systolic murmur is present.     No diastolic murmur is present.     No friction rub. No gallop. No S3 or S4 sounds.  Pulmonary:     Effort: Pulmonary effort is normal. No tachypnea, accessory muscle usage or respiratory distress.     Breath sounds: No stridor. No decreased breath sounds, wheezing, rhonchi or rales.  Chest:     Chest wall: No tenderness.  Abdominal:     General: Bowel sounds are normal. There is no distension.     Palpations: Abdomen is soft. Abdomen is not rigid.     Tenderness: There is no abdominal tenderness. There is no guarding or rebound.  Musculoskeletal:        General: Normal range of motion.     Cervical back: Normal range of motion and neck supple. No edema, erythema or rigidity. No muscular tenderness. Normal range of motion.     Comments: Foot exam normal  Lymphadenopathy:     Head:     Right side of head: No submental or submandibular adenopathy.     Left side of head: No submental or submandibular adenopathy.     Cervical: No cervical adenopathy.  Skin:    General: Skin is warm and dry.     Coloration: Skin is not pale.     Findings: No rash.     Nails: There is no clubbing.   Neurological:     Mental Status: He is alert and oriented to person, place, and time.     Sensory: No sensory deficit.  Psychiatric:        Attention and Perception: Attention normal. He does not perceive auditory or visual hallucinations.        Mood and Affect: Affect is not labile.        Speech: Speech is not tangential.        Behavior: Behavior is not agitated. Behavior is cooperative.        Thought Content: Thought content is not paranoid or delusional. Thought content does not include homicidal or suicidal ideation. Thought content does not include homicidal or suicidal plan.        Cognition and Memory: Cognition is not impaired. Memory is not impaired.        Judgment: Judgment is not impulsive or inappropriate.     BP 138/87 (BP Location: Left Arm, Patient Position: Sitting, Cuff Size: Normal)   Pulse 66   Wt 256 lb 9.6 oz (116.4 kg)   SpO2 94%   BMI 37.89 kg/m  Wt Readings from Last 3 Encounters:  12/07/22 256 lb 9.6 oz (116.4 kg)  09/29/22 259 lb (117.5 kg)  08/03/22 259 lb 12.8 oz (117.8 kg)     Health Maintenance Due  Topic Date Due   Fecal DNA (Cologuard)  Never done   Lung Cancer Screening  Never done   OPHTHALMOLOGY EXAM  09/04/2021    There are no preventive care reminders to display for this patient.  Lab Results  Component Value Date   TSH 1.140 08/28/2018   Lab Results  Component Value Date   WBC 10.0 08/23/2021   HGB 15.4 08/23/2021   HCT 46.9 08/23/2021   MCV 92.3 08/23/2021   PLT 216 08/23/2021   Lab Results  Component Value Date   NA 134 08/03/2022   K 4.8 08/03/2022   CO2 22 08/03/2022   GLUCOSE 77 08/03/2022   BUN 24 08/03/2022   CREATININE 1.32 (H) 08/03/2022   BILITOT 0.2 05/03/2022  ALKPHOS 131 (H) 05/03/2022   AST 27 05/03/2022   ALT 45 (H) 05/03/2022   PROT 6.5 05/03/2022   ALBUMIN 4.4 05/03/2022   CALCIUM 9.9 08/03/2022   ANIONGAP 11 08/23/2021   EGFR 60 08/03/2022   Lab Results  Component Value Date   CHOL 175  08/03/2022   Lab Results  Component Value Date   HDL 50 08/03/2022   Lab Results  Component Value Date   LDLCALC 96 08/03/2022   Lab Results  Component Value Date   TRIG 171 (H) 08/03/2022   Lab Results  Component Value Date   CHOLHDL 3.5 08/03/2022   Lab Results  Component Value Date   HGBA1C 7.4 (A) 12/07/2022      Assessment & Plan:   Problem List Items Addressed This Visit       Cardiovascular and Mediastinum   HTN (hypertension)    Blood pressure at goal no changes made      Relevant Medications   valsartan-hydrochlorothiazide (DIOVAN-HCT) 320-25 MG tablet   amLODipine (NORVASC) 5 MG tablet   Migraine headache without aura   Relevant Medications   gabapentin (NEURONTIN) 300 MG capsule   valsartan-hydrochlorothiazide (DIOVAN-HCT) 320-25 MG tablet   traMADol (ULTRAM) 50 MG tablet   SUMAtriptan (IMITREX) 50 MG tablet   diclofenac (VOLTAREN) 50 MG EC tablet   amLODipine (NORVASC) 5 MG tablet     Respiratory   COPD with chronic bronchitis    Continue inhaled medication      Relevant Medications   ipratropium-albuterol (DUONEB) 0.5-2.5 (3) MG/3ML SOLN   cetirizine (ZYRTEC) 10 MG tablet   fluticasone-salmeterol (ADVAIR) 250-50 MCG/ACT AEPB     Endocrine   Type 2 diabetes mellitus with hyperglycemia (HCC) - Primary    Improved diabetic control no change in medication      Relevant Medications   Insulin Pen Needle (PEN NEEDLES) 32G X 4 MM MISC   Accu-Chek FastClix Lancets MISC   glucose blood (ACCU-CHEK GUIDE) test strip   valsartan-hydrochlorothiazide (DIOVAN-HCT) 320-25 MG tablet   insulin glargine (LANTUS) 100 unit/mL SOPN   Semaglutide, 2 MG/DOSE, 8 MG/3ML SOPN   insulin lispro (HUMALOG KWIKPEN) 100 UNIT/ML KwikPen   Other Relevant Orders   POCT glucose (manual entry) (Completed)   POCT glycosylated hemoglobin (Hb A1C) (Completed)     Nervous and Auditory   Chronic low back pain with left-sided sciatica    Pain management continue       Relevant Medications   gabapentin (NEURONTIN) 300 MG capsule   traMADol (ULTRAM) 50 MG tablet   hydrOXYzine (ATARAX) 50 MG tablet   diclofenac (VOLTAREN) 50 MG EC tablet     Other   Chronic pain syndrome   Relevant Medications   gabapentin (NEURONTIN) 300 MG capsule   traMADol (ULTRAM) 50 MG tablet   diclofenac (VOLTAREN) 50 MG EC tablet   Tobacco dependence       Current smoking consumption amount: 10 cigarettes a day  Dicsussion on advise to quit smoking and smoking impacts: Cardiovascular lung impacts  Patient's willingness to quit: Not ready to quit  Methods to quit smoking discussed: Behavioral modification  Medication management of smoking session drugs discussed: Not indicated  Resources provided:  AVS   Setting quit date not established  Follow-up arranged 4 month  Time spent counseling the patient: 5 minutes       Other Visit Diagnoses     Colon cancer screening       Relevant Orders   Cologuard   Neuropathy  Relevant Medications   gabapentin (NEURONTIN) 300 MG capsule   Low back pain with radiation       Relevant Medications   traMADol (ULTRAM) 50 MG tablet   diclofenac (VOLTAREN) 50 MG EC tablet       Meds ordered this encounter  Medications   Insulin Pen Needle (PEN NEEDLES) 32G X 4 MM MISC    Sig: Use to inject insulin four times daily. Also, Ozempic once weekly.    Dispense:  100 each    Refill:  0    Please keep upcoming visit for more refills.   Accu-Chek FastClix Lancets MISC    Sig: Use to check blood sugars three times per day    Dispense:  102 each    Refill:  11   glucose blood (ACCU-CHEK GUIDE) test strip    Sig: Use as instructed to check blood sugars three times per day.    Dispense:  100 each    Refill:  11   ipratropium-albuterol (DUONEB) 0.5-2.5 (3) MG/3ML SOLN    Sig: Take 3 mLs by nebulization every 6 (six) hours as needed (shortness of breath).    Dispense:  180 mL    Refill:  1   gabapentin (NEURONTIN) 300 MG  capsule    Sig: Take 2 capsules (600 mg total) by mouth 3 (three) times daily.    Dispense:  180 capsule    Refill:  3   valsartan-hydrochlorothiazide (DIOVAN-HCT) 320-25 MG tablet    Sig: Take 1 tablet by mouth daily.    Dispense:  90 tablet    Refill:  3   traMADol (ULTRAM) 50 MG tablet    Sig: Take 1 tablet (50 mg total) by mouth every 6 (six) hours as needed for moderate pain or severe pain.    Dispense:  120 tablet    Refill:  0    Future refill   SUMAtriptan (IMITREX) 50 MG tablet    Sig: Take 1 tablet (50 mg total) by mouth every 2 (two) hours as needed for migraine. May repeat in 2 hours if headache persists or recurs.    Dispense:  10 tablet    Refill:  0    Please keep upcoming visit for more refills.   hydrOXYzine (ATARAX) 50 MG tablet    Sig: Take 1 tablet (50 mg total) by mouth 3 (three) times daily.    Dispense:  90 tablet    Refill:  2   diclofenac (VOLTAREN) 50 MG EC tablet    Sig: Take 1 tablet (50 mg total) by mouth 2 (two) times daily as needed for moderate pain. Eat before taking the medicine.    Dispense:  60 tablet    Refill:  0    Please keep upcoming visit for more refills.   amLODipine (NORVASC) 5 MG tablet    Sig: Take 1 tablet (5 mg total) by mouth daily.    Dispense:  90 tablet    Refill:  2   cetirizine (ZYRTEC) 10 MG tablet    Sig: Take 1 tablet (10 mg total) by mouth daily.    Dispense:  30 tablet    Refill:  11   omeprazole (PRILOSEC) 20 MG capsule    Sig: Take 1 capsule (20 mg total) by mouth daily.    Dispense:  30 capsule    Refill:  0    Please keep upcoming visit for more refills.   insulin glargine (LANTUS) 100 unit/mL SOPN    Sig: Inject 38  Units into the skin at bedtime.    Dispense:  15 mL    Refill:  4   Semaglutide, 2 MG/DOSE, 8 MG/3ML SOPN    Sig: Inject 2 mg as directed once a week.    Dispense:  9 mL    Refill:  2   insulin lispro (HUMALOG KWIKPEN) 100 UNIT/ML KwikPen    Sig: Inject 10 Units into the skin 3 (three) times  daily.    Dispense:  15 mL    Refill:  0    Please keep upcoming visit for more refills.   fluticasone-salmeterol (ADVAIR) 250-50 MCG/ACT AEPB    Sig: Inhale 1 puff into the lungs 2 (two) times daily.    Dispense:  60 each    Refill:  6    Follow-up: Return in about 4 months (around 04/08/2023) for diabetes, htn, primary care follow up.    Shan Levans, MD

## 2022-12-07 ENCOUNTER — Encounter: Payer: Self-pay | Admitting: Critical Care Medicine

## 2022-12-07 ENCOUNTER — Ambulatory Visit: Payer: 59 | Attending: Critical Care Medicine | Admitting: Critical Care Medicine

## 2022-12-07 ENCOUNTER — Other Ambulatory Visit: Payer: Self-pay

## 2022-12-07 VITALS — BP 138/87 | HR 66 | Wt 256.6 lb

## 2022-12-07 DIAGNOSIS — E1165 Type 2 diabetes mellitus with hyperglycemia: Secondary | ICD-10-CM | POA: Diagnosis not present

## 2022-12-07 DIAGNOSIS — G894 Chronic pain syndrome: Secondary | ICD-10-CM

## 2022-12-07 DIAGNOSIS — G629 Polyneuropathy, unspecified: Secondary | ICD-10-CM | POA: Diagnosis not present

## 2022-12-07 DIAGNOSIS — Z1211 Encounter for screening for malignant neoplasm of colon: Secondary | ICD-10-CM

## 2022-12-07 DIAGNOSIS — G43009 Migraine without aura, not intractable, without status migrainosus: Secondary | ICD-10-CM

## 2022-12-07 DIAGNOSIS — M545 Low back pain, unspecified: Secondary | ICD-10-CM

## 2022-12-07 DIAGNOSIS — J4489 Other specified chronic obstructive pulmonary disease: Secondary | ICD-10-CM

## 2022-12-07 DIAGNOSIS — F17210 Nicotine dependence, cigarettes, uncomplicated: Secondary | ICD-10-CM | POA: Diagnosis not present

## 2022-12-07 DIAGNOSIS — Z794 Long term (current) use of insulin: Secondary | ICD-10-CM

## 2022-12-07 DIAGNOSIS — Z7985 Long-term (current) use of injectable non-insulin antidiabetic drugs: Secondary | ICD-10-CM

## 2022-12-07 DIAGNOSIS — G8929 Other chronic pain: Secondary | ICD-10-CM

## 2022-12-07 DIAGNOSIS — I10 Essential (primary) hypertension: Secondary | ICD-10-CM | POA: Diagnosis not present

## 2022-12-07 DIAGNOSIS — F17200 Nicotine dependence, unspecified, uncomplicated: Secondary | ICD-10-CM

## 2022-12-07 LAB — GLUCOSE, POCT (MANUAL RESULT ENTRY): POC Glucose: 150 mg/dl — AB (ref 70–99)

## 2022-12-07 LAB — POCT GLYCOSYLATED HEMOGLOBIN (HGB A1C): HbA1c, POC (controlled diabetic range): 7.4 % — AB (ref 0.0–7.0)

## 2022-12-07 MED ORDER — PEN NEEDLES 32G X 4 MM MISC
0 refills | Status: DC
Start: 2022-12-07 — End: 2023-03-07
  Filled 2022-12-07: qty 100, fill #0
  Filled 2022-12-17: qty 100, 25d supply, fill #0

## 2022-12-07 MED ORDER — SEMAGLUTIDE (2 MG/DOSE) 8 MG/3ML ~~LOC~~ SOPN
2.0000 mg | PEN_INJECTOR | SUBCUTANEOUS | 2 refills | Status: DC
Start: 2022-12-07 — End: 2023-09-29
  Filled 2022-12-07 – 2022-12-17 (×2): qty 9, 84d supply, fill #0
  Filled 2023-03-07: qty 9, 84d supply, fill #1
  Filled 2023-06-12: qty 9, 84d supply, fill #2

## 2022-12-07 MED ORDER — OMEPRAZOLE 20 MG PO CPDR
20.0000 mg | DELAYED_RELEASE_CAPSULE | Freq: Every day | ORAL | 0 refills | Status: DC
  Filled 2022-12-07 – 2022-12-17 (×2): qty 30, 30d supply, fill #0

## 2022-12-07 MED ORDER — ACCU-CHEK GUIDE VI STRP
ORAL_STRIP | 11 refills | Status: AC
  Filled 2022-12-07: qty 100, fill #0
  Filled 2022-12-21: qty 100, 33d supply, fill #0
  Filled 2023-02-03: qty 100, 33d supply, fill #1
  Filled 2023-03-07: qty 100, 33d supply, fill #2

## 2022-12-07 MED ORDER — AMLODIPINE BESYLATE 5 MG PO TABS
5.0000 mg | ORAL_TABLET | Freq: Every day | ORAL | 2 refills | Status: DC
  Filled 2022-12-07: qty 90, 90d supply, fill #0

## 2022-12-07 MED ORDER — DICLOFENAC SODIUM 50 MG PO TBEC
50.0000 mg | DELAYED_RELEASE_TABLET | Freq: Two times a day (BID) | ORAL | 0 refills | Status: DC | PRN
Start: 2022-12-07 — End: 2023-01-19
  Filled 2022-12-07 – 2022-12-21 (×2): qty 60, 30d supply, fill #0

## 2022-12-07 MED ORDER — INSULIN GLARGINE 100 UNITS/ML SOLOSTAR PEN
38.0000 [IU] | PEN_INJECTOR | Freq: Every day | SUBCUTANEOUS | 4 refills | Status: DC
Start: 2022-12-07 — End: 2023-07-20
  Filled 2022-12-07 – 2022-12-26 (×2): qty 15, 39d supply, fill #0
  Filled 2023-02-03: qty 15, 39d supply, fill #1
  Filled 2023-03-16: qty 15, 39d supply, fill #2
  Filled 2023-04-25: qty 15, 39d supply, fill #3
  Filled 2023-06-12: qty 15, 39d supply, fill #4

## 2022-12-07 MED ORDER — FLUTICASONE-SALMETEROL 250-50 MCG/ACT IN AEPB
1.0000 | INHALATION_SPRAY | Freq: Two times a day (BID) | RESPIRATORY_TRACT | 6 refills | Status: DC
  Filled 2022-12-07 – 2022-12-17 (×2): qty 60, 30d supply, fill #0
  Filled 2023-01-16: qty 60, 30d supply, fill #1
  Filled 2023-02-11: qty 60, 30d supply, fill #2
  Filled 2023-03-16: qty 60, 30d supply, fill #3
  Filled 2023-06-12: qty 60, 30d supply, fill #4
  Filled 2023-07-14: qty 60, 30d supply, fill #5
  Filled 2023-09-12: qty 60, 30d supply, fill #6

## 2022-12-07 MED ORDER — GABAPENTIN 300 MG PO CAPS
600.0000 mg | ORAL_CAPSULE | Freq: Three times a day (TID) | ORAL | 3 refills | Status: DC
  Filled 2022-12-07 – 2022-12-17 (×2): qty 180, 30d supply, fill #0
  Filled 2023-01-16: qty 180, 30d supply, fill #1
  Filled 2023-02-11: qty 180, 30d supply, fill #2
  Filled 2023-03-16: qty 180, 30d supply, fill #3

## 2022-12-07 MED ORDER — ACCU-CHEK FASTCLIX LANCETS MISC
11 refills | Status: AC
  Filled 2022-12-07: qty 102, 34d supply, fill #0
  Filled 2023-01-16: qty 102, 34d supply, fill #1
  Filled 2023-03-07: qty 102, 34d supply, fill #2
  Filled 2023-04-14: qty 102, 34d supply, fill #3
  Filled 2023-08-28: qty 102, 34d supply, fill #4
  Filled 2023-10-10: qty 102, 34d supply, fill #5

## 2022-12-07 MED ORDER — TRAMADOL HCL 50 MG PO TABS
50.0000 mg | ORAL_TABLET | Freq: Four times a day (QID) | ORAL | 0 refills | Status: DC | PRN
  Filled 2022-12-07 – 2022-12-21 (×2): qty 120, 30d supply, fill #0

## 2022-12-07 MED ORDER — SUMATRIPTAN SUCCINATE 50 MG PO TABS
50.0000 mg | ORAL_TABLET | ORAL | 0 refills | Status: DC | PRN
Start: 2022-12-07 — End: 2023-01-17
  Filled 2022-12-07: qty 4, 10d supply, fill #0
  Filled 2022-12-21: qty 4, 10d supply, fill #1
  Filled 2023-01-16: qty 4, 10d supply, fill #2

## 2022-12-07 MED ORDER — INSULIN LISPRO (1 UNIT DIAL) 100 UNIT/ML (KWIKPEN)
10.0000 [IU] | PEN_INJECTOR | Freq: Three times a day (TID) | SUBCUTANEOUS | 0 refills | Status: DC
Start: 2022-12-07 — End: 2023-03-07
  Filled 2022-12-07 – 2023-01-16 (×2): qty 15, 50d supply, fill #0

## 2022-12-07 MED ORDER — VALSARTAN-HYDROCHLOROTHIAZIDE 320-25 MG PO TABS
1.0000 | ORAL_TABLET | Freq: Every day | ORAL | 3 refills | Status: DC
  Filled 2022-12-07 – 2023-02-18 (×2): qty 90, 90d supply, fill #0
  Filled 2023-05-17: qty 90, 90d supply, fill #1
  Filled 2023-08-15: qty 90, 90d supply, fill #2

## 2022-12-07 MED ORDER — IPRATROPIUM-ALBUTEROL 0.5-2.5 (3) MG/3ML IN SOLN
3.0000 mL | Freq: Four times a day (QID) | RESPIRATORY_TRACT | 1 refills | Status: DC | PRN
Start: 2022-12-07 — End: 2023-08-30

## 2022-12-07 MED ORDER — CETIRIZINE HCL 10 MG PO TABS
10.0000 mg | ORAL_TABLET | Freq: Every day | ORAL | 11 refills | Status: DC
  Filled 2022-12-07 – 2023-05-26 (×2): qty 30, 30d supply, fill #0
  Filled 2023-06-28: qty 30, 30d supply, fill #1
  Filled 2023-07-29: qty 30, 30d supply, fill #2
  Filled 2023-08-28: qty 30, 30d supply, fill #3
  Filled 2023-10-10: qty 30, 30d supply, fill #4
  Filled 2023-11-16: qty 30, 30d supply, fill #5

## 2022-12-07 MED ORDER — HYDROXYZINE HCL 50 MG PO TABS
50.0000 mg | ORAL_TABLET | Freq: Three times a day (TID) | ORAL | 2 refills | Status: DC
  Filled 2022-12-07: qty 90, 30d supply, fill #0

## 2022-12-07 NOTE — Assessment & Plan Note (Signed)
Blood pressure at goal no changes made 

## 2022-12-07 NOTE — Assessment & Plan Note (Signed)
Pain management continue

## 2022-12-07 NOTE — Assessment & Plan Note (Signed)
Continue inhaled medication

## 2022-12-07 NOTE — Patient Instructions (Signed)
All medications refilled your nebulizer medicine sent to your [facility] the rest of our pharmacy downstairs  A Cologuard kit was reordered  No labs needed today  Focus on tobacco reduction  Return to clinic 4 months

## 2022-12-07 NOTE — Assessment & Plan Note (Signed)
Improved diabetic control no change in medication

## 2022-12-07 NOTE — Assessment & Plan Note (Signed)
  .   Current smoking consumption amount: 10 cigarettes a day  . Dicsussion on advise to quit smoking and smoking impacts: Cardiovascular lung impacts  . Patient's willingness to quit: Not ready to quit  . Methods to quit smoking discussed: Behavioral modification  . Medication management of smoking session drugs discussed: Not indicated  . Resources provided:  AVS   . Setting quit date not established  Follow-up arranged 4 month  Time spent counseling the patient: 5 minutes

## 2022-12-17 ENCOUNTER — Other Ambulatory Visit: Payer: Self-pay

## 2022-12-21 ENCOUNTER — Other Ambulatory Visit: Payer: Self-pay

## 2022-12-22 ENCOUNTER — Other Ambulatory Visit: Payer: Self-pay

## 2022-12-28 ENCOUNTER — Other Ambulatory Visit: Payer: Self-pay

## 2022-12-29 ENCOUNTER — Other Ambulatory Visit: Payer: Self-pay

## 2023-01-16 ENCOUNTER — Other Ambulatory Visit: Payer: Self-pay | Admitting: Critical Care Medicine

## 2023-01-17 ENCOUNTER — Other Ambulatory Visit: Payer: Self-pay

## 2023-01-17 ENCOUNTER — Other Ambulatory Visit: Payer: Self-pay | Admitting: Critical Care Medicine

## 2023-01-17 DIAGNOSIS — G43009 Migraine without aura, not intractable, without status migrainosus: Secondary | ICD-10-CM

## 2023-01-17 MED ORDER — OMEPRAZOLE 20 MG PO CPDR
20.0000 mg | DELAYED_RELEASE_CAPSULE | Freq: Every day | ORAL | 1 refills | Status: DC
  Filled 2023-01-17: qty 90, 90d supply, fill #0
  Filled 2023-04-14: qty 90, 90d supply, fill #1

## 2023-01-18 ENCOUNTER — Other Ambulatory Visit: Payer: Self-pay

## 2023-01-18 MED ORDER — SUMATRIPTAN SUCCINATE 50 MG PO TABS
50.0000 mg | ORAL_TABLET | ORAL | 0 refills | Status: DC | PRN
Start: 2023-01-18 — End: 2023-03-07
  Filled 2023-01-18: qty 9, 23d supply, fill #0
  Filled 2023-03-07: qty 9, 23d supply, fill #1

## 2023-01-18 NOTE — Telephone Encounter (Signed)
Requested Prescriptions  Pending Prescriptions Disp Refills   SUMAtriptan (IMITREX) 50 MG tablet 10 tablet 0    Sig: Take 1 tablet (50 mg total) by mouth every 2 (two) hours as needed for migraine. May repeat in 2 hours if headache persists or recurs.     Neurology:  Migraine Therapy - Triptan Passed - 01/17/2023  8:48 AM      Passed - Last BP in normal range    BP Readings from Last 1 Encounters:  12/07/22 138/87         Passed - Valid encounter within last 12 months    Recent Outpatient Visits           1 month ago Type 2 diabetes mellitus with hyperglycemia, with long-term current use of insulin [facility])   [place] [facility] Storm Frisk, MD   5 months ago Type 2 diabetes mellitus with hyperglycemia, with long-term current use of insulin [facility])   [place] [facility] Storm Frisk, MD   8 months ago Type 2 diabetes mellitus with hyperglycemia, with long-term current use of insulin [facility])   [place] [facility] [place], Marylene Land M, [place]   9 months ago Type 2 diabetes mellitus with hyperglycemia, with long-term current use of insulin [facility])   [place] [facility] [place], Jeannett Senior L, RPH-CPP   11 months ago Type 2 diabetes mellitus with hyperglycemia, with long-term current use of insulin [facility])   [place] [facility] Drucilla Chalet, RPH-CPP       Future Appointments             In 2 months Laural Benes, Binnie Rail, MD [facility]

## 2023-01-19 ENCOUNTER — Other Ambulatory Visit: Payer: Self-pay | Admitting: Critical Care Medicine

## 2023-01-19 ENCOUNTER — Other Ambulatory Visit: Payer: Self-pay

## 2023-01-19 DIAGNOSIS — G8929 Other chronic pain: Secondary | ICD-10-CM

## 2023-01-19 DIAGNOSIS — G894 Chronic pain syndrome: Secondary | ICD-10-CM

## 2023-01-19 DIAGNOSIS — M545 Low back pain, unspecified: Secondary | ICD-10-CM

## 2023-01-19 MED ORDER — DICLOFENAC SODIUM 50 MG PO TBEC
50.0000 mg | DELAYED_RELEASE_TABLET | Freq: Two times a day (BID) | ORAL | 2 refills | Status: DC | PRN
Start: 2023-01-19 — End: 2023-04-25
  Filled 2023-01-19: qty 60, 30d supply, fill #0
  Filled 2023-02-24: qty 60, 30d supply, fill #1
  Filled 2023-03-28: qty 60, 30d supply, fill #2

## 2023-01-20 ENCOUNTER — Other Ambulatory Visit: Payer: Self-pay

## 2023-01-20 MED ORDER — TRAMADOL HCL 50 MG PO TABS
50.0000 mg | ORAL_TABLET | Freq: Four times a day (QID) | ORAL | 0 refills | Status: DC | PRN
  Filled 2023-01-20: qty 120, 30d supply, fill #0

## 2023-01-28 ENCOUNTER — Other Ambulatory Visit: Payer: Self-pay | Admitting: Critical Care Medicine

## 2023-01-28 DIAGNOSIS — Z1211 Encounter for screening for malignant neoplasm of colon: Secondary | ICD-10-CM

## 2023-01-28 DIAGNOSIS — Z1212 Encounter for screening for malignant neoplasm of rectum: Secondary | ICD-10-CM

## 2023-02-03 ENCOUNTER — Other Ambulatory Visit: Payer: Self-pay

## 2023-02-04 ENCOUNTER — Other Ambulatory Visit: Payer: Self-pay

## 2023-02-14 ENCOUNTER — Other Ambulatory Visit: Payer: Self-pay

## 2023-02-16 ENCOUNTER — Telehealth: Payer: 59 | Admitting: Physician Assistant

## 2023-02-16 ENCOUNTER — Other Ambulatory Visit: Payer: Self-pay

## 2023-02-16 DIAGNOSIS — J441 Chronic obstructive pulmonary disease with (acute) exacerbation: Secondary | ICD-10-CM

## 2023-02-16 DIAGNOSIS — B9689 Other specified bacterial agents as the cause of diseases classified elsewhere: Secondary | ICD-10-CM

## 2023-02-16 DIAGNOSIS — J019 Acute sinusitis, unspecified: Secondary | ICD-10-CM | POA: Diagnosis not present

## 2023-02-16 MED ORDER — PROMETHAZINE-DM 6.25-15 MG/5ML PO SYRP
5.0000 mL | ORAL_SOLUTION | Freq: Four times a day (QID) | ORAL | 0 refills | Status: DC | PRN
Start: 2023-02-16 — End: 2023-04-10
  Filled 2023-02-16: qty 118, 6d supply, fill #0

## 2023-02-16 MED ORDER — DOXYCYCLINE HYCLATE 100 MG PO TABS
100.0000 mg | ORAL_TABLET | Freq: Two times a day (BID) | ORAL | 0 refills | Status: DC
Start: 2023-02-16 — End: 2023-04-08
  Filled 2023-02-16: qty 20, 10d supply, fill #0

## 2023-02-16 MED ORDER — PREDNISONE 20 MG PO TABS
40.0000 mg | ORAL_TABLET | Freq: Every day | ORAL | 0 refills | Status: DC
Start: 2023-02-16 — End: 2023-04-08
  Filled 2023-02-16: qty 14, 7d supply, fill #0

## 2023-02-16 NOTE — Progress Notes (Signed)
Virtual Visit Consent   Joshua [name], you are scheduled for a virtual visit with a [facility] provider today. Just as with appointments in the office, your consent must be obtained to participate. Your consent will be active for this visit and any virtual visit you may have with one of our providers in the next 365 days. If you have a MyChart account, a copy of this consent can be sent to you electronically.  As this is a virtual visit, video technology does not allow for your provider to perform a traditional examination. This may limit your provider's ability to fully assess your condition. If your provider identifies any concerns that need to be evaluated in person or the need to arrange testing (such as labs, EKG, etc.), we will make arrangements to do so. Although advances in technology are sophisticated, we cannot ensure that it will always work on either your end or our end. If the connection with a video visit is poor, the visit may have to be switched to a telephone visit. With either a video or telephone visit, we are not always able to ensure that we have a secure connection.  By engaging in this virtual visit, you consent to the provision of healthcare and authorize for your insurance to be billed (if applicable) for the services provided during this visit. Depending on your insurance coverage, you may receive a charge related to this service.  I need to obtain your verbal consent now. Are you willing to proceed with your visit today? Joshua [name] has provided verbal consent on 02/16/2023 for a virtual visit (video or telephone). Margaretann Loveless, PA-C  Date: 02/16/2023 2:51 PM  Virtual Visit via Video Note   I, Margaretann Loveless, connected with  Joshua [name]  ([id number], 1955/08/28) on 02/16/23 at  2:45 PM EDT by a video-enabled telemedicine application and verified that I am speaking with the correct person using two identifiers.  Location: Patient: Virtual Visit  Location Patient: Home Provider: Virtual Visit Location Provider: Home Office   I discussed the limitations of evaluation and management by telemedicine and the availability of in person appointments. The patient expressed understanding and agreed to proceed.    History of Present Illness: Joshua [name] is a 67 y.o. who identifies as a male who was assigned male at birth, and is being seen today for cough.  HPI: Cough This is a new problem. The current episode started 1 to 4 weeks ago (Started beginning of October and has been worsening). The problem has been gradually worsening. The problem occurs constantly. The cough is Productive of sputum and productive of purulent sputum. Associated symptoms include headaches, myalgias, nasal congestion, postnasal drip, rhinorrhea, shortness of breath and wheezing. Pertinent negatives include no chills, ear congestion, ear pain, fever or sore throat. The symptoms are aggravated by lying down. Risk factors for lung disease include smoking/tobacco exposure. He has tried a beta-agonist inhaler and ipratropium inhaler ("sinus medication", nebulizer treatments) for the symptoms. The treatment provided no relief. His past medical history is significant for bronchitis and COPD.    PMH: COPD   Problems:  Patient Active Problem List   Diagnosis Date Noted   COPD with acute exacerbation (HCC) 08/03/2022   Statin myopathy 12/09/2021   Spinal stenosis of lumbar region 02/19/2020   Tobacco dependence 08/05/2019   Type 2 diabetes mellitus with hyperglycemia (HCC) 05/14/2019   Peripheral vascular disease due to secondary diabetes (HCC) 05/14/2019   Chronic low back pain  with left-sided sciatica 05/14/2019   COPD with chronic bronchitis (HCC) 03/27/2019   Schizoaffective disorder (HCC) 07/24/2018   Chronic pain syndrome 10/14/2016   Osteoarthritis 10/14/2016   HTN (hypertension) 10/14/2016   Migraine headache without aura 10/14/2016   Schizoaffective disorder,  bipolar type (HCC) 05/10/2016   Generalized anxiety disorder 05/04/2016    Allergies:  Allergies  Allergen Reactions   Statins Other (See Comments)    Myopathy   Haldol [Haloperidol] Other (See Comments)    Unk reaction   Metformin And Related Diarrhea and Nausea And Vomiting   Trazodone And Nefazodone Itching   Medications:  Current Outpatient Medications:    doxycycline (VIBRA-TABS) 100 MG tablet, Take 1 tablet (100 mg total) by mouth 2 (two) times daily., Disp: 20 tablet, Rfl: 0   predniSONE (DELTASONE) 20 MG tablet, Take 2 tablets (40 mg total) by mouth daily with breakfast., Disp: 14 tablet, Rfl: 0   promethazine-dextromethorphan (PROMETHAZINE-DM) 6.25-15 MG/5ML syrup, Take 5 mLs by mouth 4 (four) times daily as needed., Disp: 118 mL, Rfl: 0   Accu-Chek FastClix Lancets MISC, Use to check blood sugars three times per day, Disp: 102 each, Rfl: 11   albuterol (PROAIR HFA) 108 (90 Base) MCG/ACT inhaler, Inhale 2 puffs into the lungs every 4 (four) hours as needed for wheezing or shortness of breath., Disp: 8.5 g, Rfl: 3   amLODipine (NORVASC) 5 MG tablet, Take 1 tablet (5 mg total) by mouth daily., Disp: 90 tablet, Rfl: 2   blood glucose meter kit and supplies KIT, Dispense based on patient and insurance preference. Use up to four times daily as directed. ICD-10 E11.65  Z79.4, Disp: 1 each, Rfl: 0   Blood Glucose Monitoring Suppl (ACCU-CHEK GUIDE ME) w/Device KIT, Use to check blood sugars up to 3 times per day. ICD-10 E11.65, Z79.4, Disp: 1 kit, Rfl: 0   Blood Pressure Monitoring (BLOOD PRESSURE KIT) DEVI, Use to measure blood pressure, Disp: 1 each, Rfl: 0   cetirizine (ZYRTEC) 10 MG tablet, Take 1 tablet (10 mg total) by mouth daily., Disp: 30 tablet, Rfl: 11   diclofenac (VOLTAREN) 50 MG EC tablet, Take 1 tablet (50 mg total) by mouth 2 (two) times daily as needed for moderate pain. Eat before taking the medicine., Disp: 60 tablet, Rfl: 2   fluticasone-salmeterol (ADVAIR) 250-50  MCG/ACT AEPB, Inhale 1 puff into the lungs 2 (two) times daily., Disp: 60 each, Rfl: 6   gabapentin (NEURONTIN) 300 MG capsule, Take 2 capsules (600 mg total) by mouth 3 (three) times daily., Disp: 180 capsule, Rfl: 3   glucose blood (ACCU-CHEK GUIDE) test strip, Use as instructed to check blood sugars three times per day., Disp: 100 each, Rfl: 11   hydrOXYzine (ATARAX) 50 MG tablet, Take 1 tablet (50 mg total) by mouth 3 (three) times daily., Disp: 90 tablet, Rfl: 2   insulin glargine (LANTUS) 100 unit/mL SOPN, Inject 38 Units into the skin at bedtime., Disp: 15 mL, Rfl: 4   insulin lispro (HUMALOG KWIKPEN) 100 UNIT/ML KwikPen, Inject 10 Units into the skin 3 (three) times daily., Disp: 15 mL, Rfl: 0   Insulin Pen Needle (PEN NEEDLES) 32G X 4 MM MISC, Use to inject insulin four times daily. Also, Ozempic once weekly., Disp: 100 each, Rfl: 0   ipratropium-albuterol (DUONEB) 0.5-2.5 (3) MG/3ML SOLN, Take 3 mLs by nebulization every 6 (six) hours as needed (shortness of breath)., Disp: 180 mL, Rfl: 1   Lancets Misc. (ACCU-CHEK FASTCLIX LANCET) KIT, Use to check blood sugars  3 times per day, Disp: 1 kit, Rfl: 11   omeprazole (PRILOSEC) 20 MG capsule, Take 1 capsule (20 mg total) by mouth daily., Disp: 90 capsule, Rfl: 1   paliperidone (INVEGA) 6 MG 24 hr tablet, Take 6 mg by mouth at bedtime., Disp: , Rfl:    Respiratory Therapy Supplies (FLUTTER) DEVI, Use with 4 times daily, Disp: 1 each, Rfl: 0   Semaglutide, 2 MG/DOSE, 8 MG/3ML SOPN, Inject 2 mg as directed once a week., Disp: 9 mL, Rfl: 2   Spacer/Aero-Holding Chambers (AEROCHAMBER MV) inhaler, Use as instructed, Disp: 1 each, Rfl: 0   SUMAtriptan (IMITREX) 50 MG tablet, Take 1 tablet (50 mg total) by mouth every 2 (two) hours as needed for migraine. May repeat in 2 hours if headache persists or recurs., Disp: 10 tablet, Rfl: 0   traMADol (ULTRAM) 50 MG tablet, Take 1 tablet (50 mg total) by mouth every 6 (six) hours as needed for moderate pain or  severe pain., Disp: 120 tablet, Rfl: 0   valsartan-hydrochlorothiazide (DIOVAN-HCT) 320-25 MG tablet, Take 1 tablet by mouth daily., Disp: 90 tablet, Rfl: 3  Observations/Objective: Patient is well-developed, well-nourished in no acute distress.  Resting comfortably at home.  Head is normocephalic, atraumatic.  No labored breathing.  Speech is clear and coherent with logical content.  Patient is alert and oriented at baseline.    Assessment and Plan: 1. COPD with acute exacerbation (HCC) - doxycycline (VIBRA-TABS) 100 MG tablet; Take 1 tablet (100 mg total) by mouth 2 (two) times daily.  Dispense: 20 tablet; Refill: 0 - predniSONE (DELTASONE) 20 MG tablet; Take 2 tablets (40 mg total) by mouth daily with breakfast.  Dispense: 14 tablet; Refill: 0 - promethazine-dextromethorphan (PROMETHAZINE-DM) 6.25-15 MG/5ML syrup; Take 5 mLs by mouth 4 (four) times daily as needed.  Dispense: 118 mL; Refill: 0  2. Acute bacterial sinusitis - doxycycline (VIBRA-TABS) 100 MG tablet; Take 1 tablet (100 mg total) by mouth 2 (two) times daily.  Dispense: 20 tablet; Refill: 0 - predniSONE (DELTASONE) 20 MG tablet; Take 2 tablets (40 mg total) by mouth daily with breakfast.  Dispense: 14 tablet; Refill: 0 - promethazine-dextromethorphan (PROMETHAZINE-DM) 6.25-15 MG/5ML syrup; Take 5 mLs by mouth 4 (four) times daily as needed.  Dispense: 118 mL; Refill: 0  - Worsening over a week despite OTC medications - Will treat with Doxycycline, Prednisone, and Promethazine DM - Can continue Mucinex  - Continue inhalers as prescribed and use Nebulizer as needed - Push fluids.  - Rest.  - Steam and humidifier can help - Seek in person evaluation if worsening or symptoms fail to improve    Follow Up Instructions: I discussed the assessment and treatment plan with the patient. The patient was provided an opportunity to ask questions and all were answered. The patient agreed with the plan and demonstrated an  understanding of the instructions.  A copy of instructions were sent to the patient via MyChart unless otherwise noted below.    The patient was advised to call back or seek an in-person evaluation if the symptoms worsen or if the condition fails to improve as anticipated.    Margaretann Loveless, PA-C

## 2023-02-16 NOTE — Patient Instructions (Signed)
Joshua [name], thank you for joining Joshua Loveless, PA-C for today's virtual visit.  While this provider is not your primary care provider (PCP), if your PCP is located in our provider database this encounter information will be shared with them immediately following your visit.   A [facility] MyChart account gives you access to today's visit and all your visits, tests, and labs performed at [facility] " click here if you don't have a [facility] MyChart account or go to [email or web address]  Consent: (Patient) Joshua [name] provided verbal consent for this virtual visit at the beginning of the encounter.  Current Medications:  Current Outpatient Medications:    doxycycline (VIBRA-TABS) 100 MG tablet, Take 1 tablet (100 mg total) by mouth 2 (two) times daily., Disp: 20 tablet, Rfl: 0   predniSONE (DELTASONE) 20 MG tablet, Take 2 tablets (40 mg total) by mouth daily with breakfast., Disp: 14 tablet, Rfl: 0   promethazine-dextromethorphan (PROMETHAZINE-DM) 6.25-15 MG/5ML syrup, Take 5 mLs by mouth 4 (four) times daily as needed., Disp: 118 mL, Rfl: 0   Accu-Chek FastClix Lancets MISC, Use to check blood sugars three times per day, Disp: 102 each, Rfl: 11   albuterol (PROAIR HFA) 108 (90 Base) MCG/ACT inhaler, Inhale 2 puffs into the lungs every 4 (four) hours as needed for wheezing or shortness of breath., Disp: 8.5 g, Rfl: 3   amLODipine (NORVASC) 5 MG tablet, Take 1 tablet (5 mg total) by mouth daily., Disp: 90 tablet, Rfl: 2   blood glucose meter kit and supplies KIT, Dispense based on patient and insurance preference. Use up to four times daily as directed. ICD-10 E11.65  Z79.4, Disp: 1 each, Rfl: 0   Blood Glucose Monitoring Suppl (ACCU-CHEK GUIDE ME) w/Device KIT, Use to check blood sugars up to 3 times per day. ICD-10 E11.65, Z79.4, Disp: 1 kit, Rfl: 0   Blood Pressure Monitoring (BLOOD PRESSURE KIT) DEVI, Use to measure blood pressure, Disp: 1 each, Rfl: 0    cetirizine (ZYRTEC) 10 MG tablet, Take 1 tablet (10 mg total) by mouth daily., Disp: 30 tablet, Rfl: 11   diclofenac (VOLTAREN) 50 MG EC tablet, Take 1 tablet (50 mg total) by mouth 2 (two) times daily as needed for moderate pain. Eat before taking the medicine., Disp: 60 tablet, Rfl: 2   fluticasone-salmeterol (ADVAIR) 250-50 MCG/ACT AEPB, Inhale 1 puff into the lungs 2 (two) times daily., Disp: 60 each, Rfl: 6   gabapentin (NEURONTIN) 300 MG capsule, Take 2 capsules (600 mg total) by mouth 3 (three) times daily., Disp: 180 capsule, Rfl: 3   glucose blood (ACCU-CHEK GUIDE) test strip, Use as instructed to check blood sugars three times per day., Disp: 100 each, Rfl: 11   hydrOXYzine (ATARAX) 50 MG tablet, Take 1 tablet (50 mg total) by mouth 3 (three) times daily., Disp: 90 tablet, Rfl: 2   insulin glargine (LANTUS) 100 unit/mL SOPN, Inject 38 Units into the skin at bedtime., Disp: 15 mL, Rfl: 4   insulin lispro (HUMALOG KWIKPEN) 100 UNIT/ML KwikPen, Inject 10 Units into the skin 3 (three) times daily., Disp: 15 mL, Rfl: 0   Insulin Pen Needle (PEN NEEDLES) 32G X 4 MM MISC, Use to inject insulin four times daily. Also, Ozempic once weekly., Disp: 100 each, Rfl: 0   ipratropium-albuterol (DUONEB) 0.5-2.5 (3) MG/3ML SOLN, Take 3 mLs by nebulization every 6 (six) hours as needed (shortness of breath)., Disp: 180 mL, Rfl: 1   Lancets Misc. (ACCU-CHEK FASTCLIX LANCET) KIT, Use  to check blood sugars 3 times per day, Disp: 1 kit, Rfl: 11   omeprazole (PRILOSEC) 20 MG capsule, Take 1 capsule (20 mg total) by mouth daily., Disp: 90 capsule, Rfl: 1   paliperidone (INVEGA) 6 MG 24 hr tablet, Take 6 mg by mouth at bedtime., Disp: , Rfl:    Respiratory Therapy Supplies (FLUTTER) DEVI, Use with 4 times daily, Disp: 1 each, Rfl: 0   Semaglutide, 2 MG/DOSE, 8 MG/3ML SOPN, Inject 2 mg as directed once a week., Disp: 9 mL, Rfl: 2   Spacer/Aero-Holding Chambers (AEROCHAMBER MV) inhaler, Use as instructed, Disp: 1 each,  Rfl: 0   SUMAtriptan (IMITREX) 50 MG tablet, Take 1 tablet (50 mg total) by mouth every 2 (two) hours as needed for migraine. May repeat in 2 hours if headache persists or recurs., Disp: 10 tablet, Rfl: 0   traMADol (ULTRAM) 50 MG tablet, Take 1 tablet (50 mg total) by mouth every 6 (six) hours as needed for moderate pain or severe pain., Disp: 120 tablet, Rfl: 0   valsartan-hydrochlorothiazide (DIOVAN-HCT) 320-25 MG tablet, Take 1 tablet by mouth daily., Disp: 90 tablet, Rfl: 3   Medications ordered in this encounter:  Meds ordered this encounter  Medications   doxycycline (VIBRA-TABS) 100 MG tablet    Sig: Take 1 tablet (100 mg total) by mouth 2 (two) times daily.    Dispense:  20 tablet    Refill:  0    Order Specific Question:   Supervising Provider    Answer:   Merrilee Jansky [[id number]]   predniSONE (DELTASONE) 20 MG tablet    Sig: Take 2 tablets (40 mg total) by mouth daily with breakfast.    Dispense:  14 tablet    Refill:  0    Order Specific Question:   Supervising Provider    Answer:   Merrilee Jansky [[id number]]   promethazine-dextromethorphan (PROMETHAZINE-DM) 6.25-15 MG/5ML syrup    Sig: Take 5 mLs by mouth 4 (four) times daily as needed.    Dispense:  118 mL    Refill:  0    Order Specific Question:   Supervising Provider    Answer:   Merrilee Jansky [[id number]]     *If you need refills on other medications prior to your next appointment, please contact your pharmacy*  Follow-Up: Call back or seek an in-person evaluation if the symptoms worsen or if the condition fails to improve as anticipated.  [facility] [phone]  Other Instructions Chronic Obstructive Pulmonary Disease Exacerbation  Chronic obstructive pulmonary disease (COPD) is a long-term (chronic) lung problem. In COPD, the flow of air from the lungs is limited. COPD exacerbations are times that breathing gets worse and you need more than your normal treatment. Without treatment, they  can be life-threatening. If they happen often, your lungs can become more damaged. What are the causes? Having infections that affect your airways and lungs. Being exposed to: Smoke. Air pollution. Chemical fumes. Dust. Things that can cause an allergic reaction (allergens). Not taking your usual COPD medicines as told. Having medical problems already, such as heart failure or infections not involving the lungs. In many cases, the cause is not known. What increases the risk? Smoking. Being an older adult. Having frequent prior COPD exacerbations. What are the signs or symptoms? Increased coughing. Increased mucus from your lungs. Increased wheezing. Increased shortness of breath. Fast breathing and finding it hard to breathe. Chest tightness. Less energy than usual. Sleep disruption from symptoms. Confusion.  Increased sleepiness. Often, these symptoms happen or get worse even with the use of medicines. How is this treated? Treatment for this condition depends on how bad it is and the cause of the symptoms. You may need to stay in the hospital for treatment. Treatment may include: Taking medicines. Using oxygen. Being treated with different ways to clear your airway, such as using a mask to deliver oxygen. Follow these instructions at home: Medicines Take over-the-counter and prescription medicines only as told by your doctor. Use all inhaled medicines the correct way. If you were prescribed an antibiotic or steroid medicine, take it as told by your doctor. Do not stop taking it even if you start to feel better. Lifestyle Do not smoke or use any products that contain nicotine or tobacco. If you need help quitting, ask your doctor. Eat healthy foods. Exercise regularly. Get enough sleep. Most adults need 7 or more hours per night. Avoid tobacco smoke and other things that can bother your lungs. Several times a day, wash your hands with soap and water for at least 20 seconds.  If you cannot use soap and water, use hand sanitizer. This may help keep you from getting an infection. During flu season, avoid areas that are crowded with people. General instructions Drink enough fluid to keep your pee (urine) pale yellow. Do not do this if your doctor has told you not to. Use a cool mist machine (vaporizer). If you use oxygen or a machine that turns medicine into a mist (nebulizer), continue to use it as told. Keep all follow-up visits. How is this prevented? Keep up with shots (vaccinations) as told by your doctor. Be sure to get a yearly flu (influenza) shot. If you smoke, quit smoking. Smoking makes the problem worse. Follow all instructions for rehabilitation. These are steps you can take to make your body work better. Work with your doctor to develop and follow an action plan. This tells you what steps to take when you experience certain symptoms. Contact a doctor if: Your COPD symptoms get worse than normal. Get help right away if: You are short of breath and it gets worse, even when you are resting. You have trouble talking. You have chest pain. You cough up blood. You have a fever. You keep vomiting. You feel weak or you pass out (faint). You feel confused. You are not able to sleep because of your symptoms. You have trouble doing daily activities. These symptoms may be an emergency. Get help right away. Call your local emergency services (911 in the [place] Do not wait to see if the symptoms will go away. Do not drive yourself to the hospital. Summary COPD exacerbations are times that breathing gets worse and you need more treatment than normal. COPD exacerbations can be very serious and may cause your lungs to become more damaged. Do not smoke. If you need help quitting, ask your doctor. Stay up to date on your shots. Get a flu shot every year. This information is not intended to replace advice given to you by your health care provider. Make sure you  discuss any questions you have with your health care provider. Document Revised: 03/05/2020 Document Reviewed: 02/19/2020 Elsevier Patient Education  2024 Elsevier Inc.    If you have been instructed to have an in-person evaluation today at a local Urgent Care facility, please use the link below. It will take you to a list of all of our available [facility], including address, phone number and  hours of operation. Please do not delay care.  [facility]  If you or a family member do not have a primary care provider, use the link below to schedule a visit and establish care. When you choose a [facility] primary care physician or advanced practice provider, you gain a long-term partner in health. Find a Primary Care Provider  Learn more about [facility]'s in-office and virtual care options: [facility] - Get Care Now

## 2023-02-18 ENCOUNTER — Other Ambulatory Visit: Payer: Self-pay | Admitting: Critical Care Medicine

## 2023-02-18 ENCOUNTER — Other Ambulatory Visit: Payer: Self-pay

## 2023-02-18 DIAGNOSIS — M545 Low back pain, unspecified: Secondary | ICD-10-CM

## 2023-02-18 DIAGNOSIS — G894 Chronic pain syndrome: Secondary | ICD-10-CM

## 2023-02-21 ENCOUNTER — Other Ambulatory Visit: Payer: Self-pay

## 2023-02-22 ENCOUNTER — Telehealth: Payer: Self-pay | Admitting: Critical Care Medicine

## 2023-02-22 ENCOUNTER — Other Ambulatory Visit: Payer: Self-pay | Admitting: Physician Assistant

## 2023-02-22 ENCOUNTER — Other Ambulatory Visit: Payer: Self-pay

## 2023-02-22 DIAGNOSIS — M545 Low back pain, unspecified: Secondary | ICD-10-CM

## 2023-02-22 DIAGNOSIS — G894 Chronic pain syndrome: Secondary | ICD-10-CM

## 2023-02-22 MED ORDER — TRAMADOL HCL 50 MG PO TABS
50.0000 mg | ORAL_TABLET | Freq: Four times a day (QID) | ORAL | 0 refills | Status: DC | PRN
Start: 2023-02-22 — End: 2023-03-21
  Filled 2023-02-22: qty 120, 30d supply, fill #0

## 2023-02-22 NOTE — Progress Notes (Signed)
Check of [place] controlled substance registry appropriate.  Refilled on patient's behalf while waiting for patient to establish with new PCP as current PCP has retired.  Roney Jaffe, PA-C Physician Assistant [facility] [email or web address]

## 2023-02-22 NOTE — Telephone Encounter (Signed)
Copied from CRM [id number]. Topic: General - Other >> Feb 22, 2023 11:22 AM Franchot Heidelberg wrote: Reason for CRM: Pt will be out of his tramadol tomorrow, is asking for clinic to respond to refill request from 02/18/2023

## 2023-02-23 NOTE — Telephone Encounter (Signed)
Noted  

## 2023-02-23 NOTE — Telephone Encounter (Signed)
Called patient and left voicemail.

## 2023-02-25 ENCOUNTER — Other Ambulatory Visit: Payer: Self-pay

## 2023-03-07 ENCOUNTER — Other Ambulatory Visit: Payer: Self-pay | Admitting: Critical Care Medicine

## 2023-03-07 ENCOUNTER — Other Ambulatory Visit: Payer: Self-pay

## 2023-03-07 DIAGNOSIS — G43009 Migraine without aura, not intractable, without status migrainosus: Secondary | ICD-10-CM

## 2023-03-07 DIAGNOSIS — Z794 Long term (current) use of insulin: Secondary | ICD-10-CM

## 2023-03-08 ENCOUNTER — Other Ambulatory Visit: Payer: Self-pay

## 2023-03-08 MED ORDER — PEN NEEDLES 32G X 4 MM MISC
0 refills | Status: DC
Start: 2023-03-08 — End: 2023-04-25
  Filled 2023-03-08: qty 100, 25d supply, fill #0

## 2023-03-08 MED ORDER — SUMATRIPTAN SUCCINATE 50 MG PO TABS
50.0000 mg | ORAL_TABLET | ORAL | 0 refills | Status: DC | PRN
  Filled 2023-03-08: qty 9, 23d supply, fill #0
  Filled 2023-04-14: qty 9, 23d supply, fill #1

## 2023-03-08 MED ORDER — INSULIN LISPRO (1 UNIT DIAL) 100 UNIT/ML (KWIKPEN)
10.0000 [IU] | PEN_INJECTOR | Freq: Three times a day (TID) | SUBCUTANEOUS | 0 refills | Status: DC
  Filled 2023-03-08: qty 15, 50d supply, fill #0

## 2023-03-08 NOTE — Telephone Encounter (Signed)
Requested Prescriptions  Pending Prescriptions Disp Refills   SUMAtriptan (IMITREX) 50 MG tablet 10 tablet 0    Sig: Take 1 tablet (50 mg total) by mouth every 2 (two) hours as needed for migraine. May repeat in 2 hours if headache persists or recurs.     Neurology:  Migraine Therapy - Triptan Passed - 03/07/2023  9:43 AM      Passed - Last BP in normal range    BP Readings from Last 1 Encounters:  12/07/22 138/87         Passed - Valid encounter within last 12 months    Recent Outpatient Visits           3 months ago Type 2 diabetes mellitus with hyperglycemia, with long-term current use of insulin (HCC)   [facility] [facility] - A Dept Of [place]. [facility] Storm Frisk, MD   7 months ago Type 2 diabetes mellitus with hyperglycemia, with long-term current use of insulin [facility])   [facility] [facility] - A Dept Of [place]. [facility] Storm Frisk, MD   10 months ago Type 2 diabetes mellitus with hyperglycemia, with long-term current use of insulin [facility])   [place] [facility] - A Dept Of [place]. [facility], Marylene Land M, [place]   11 months ago Type 2 diabetes mellitus with hyperglycemia, with long-term current use of insulin [facility])   [place] [facility] - A Dept Of [place]. [facility] Lois Huxley, [place] L, RPH-CPP   1 year ago Type 2 diabetes mellitus with hyperglycemia, with long-term current use of insulin (HCC)   [place] [facility] - A Dept Of [place]. [facility] Drucilla Chalet, RPH-CPP       Future Appointments             In 1 month Laural Benes, Binnie Rail, MD [facility] [place] - A Dept Of [name] Bridegroom. [facility]

## 2023-03-08 NOTE — Telephone Encounter (Signed)
Requested Prescriptions  Pending Prescriptions Disp Refills   Insulin Pen Needle (PEN NEEDLES) 32G X 4 MM MISC 100 each 0    Sig: Use to inject insulin four times daily. Also, Ozempic once weekly.     Endocrinology: Diabetes - Testing Supplies Passed - 03/07/2023  9:33 AM      Passed - Valid encounter within last 12 months    Recent Outpatient Visits           3 months ago Type 2 diabetes mellitus with hyperglycemia, with long-term current use of insulin (HCC)   [place] [place] [facility] - A Dept Of [place]. [facility] Storm Frisk, MD   7 months ago Type 2 diabetes mellitus with hyperglycemia, with long-term current use of insulin [facility])   [place] [facility] - A Dept Of [place]. [facility] Storm Frisk, MD   10 months ago Type 2 diabetes mellitus with hyperglycemia, with long-term current use of insulin [facility])   [place] [facility] - A Dept Of [place]. [facility], Marylene Land M, [place]   11 months ago Type 2 diabetes mellitus with hyperglycemia, with long-term current use of insulin [facility])   [place] [facility] - A Dept Of [place]. [facility] Lois Huxley, [place] L, RPH-CPP   1 year ago Type 2 diabetes mellitus with hyperglycemia, with long-term current use of insulin (HCC)   [place] [facility] - A Dept Of [place]. [facility] Drucilla Chalet, RPH-CPP       Future Appointments             In 1 month Laural Benes, Binnie Rail, MD [facility] [place] - A Dept Of [name] Bridegroom. [facility]             insulin lispro (HUMALOG KWIKPEN) 100 UNIT/ML KwikPen 15 mL 0    Sig: Inject 10 Units into the skin 3 (three) times daily.     Endocrinology:  Diabetes - Insulins Passed - 03/07/2023  9:33 AM      Passed - HBA1C is between 0 and 7.9 and within 180 days    HbA1c, POC (controlled diabetic range)  Date Value Ref Range Status  12/07/2022 7.4 (A)  0.0 - 7.0 % Final         Passed - Valid encounter within last 6 months    Recent Outpatient Visits           3 months ago Type 2 diabetes mellitus with hyperglycemia, with long-term current use of insulin (HCC)   [place] [facility] - A Dept Of [place]. [facility] Storm Frisk, MD   7 months ago Type 2 diabetes mellitus with hyperglycemia, with long-term current use of insulin [facility])   [place] [facility] - A Dept Of [place]. [facility] Storm Frisk, MD   10 months ago Type 2 diabetes mellitus with hyperglycemia, with long-term current use of insulin [facility])   [place] [facility] - A Dept Of [place]. [facility], Marylene Land M, [place]   11 months ago Type 2 diabetes mellitus with hyperglycemia, with long-term current use of insulin [facility])   [place] [facility] - A Dept Of [place]. [facility] Lois Huxley, [place] L, RPH-CPP   1 year ago Type 2 diabetes mellitus with hyperglycemia, with long-term  current use of insulin (HCC)   [place] [facility] [place] - A Dept Of [place]. [facility] Drucilla Chalet, RPH-CPP       Future Appointments             In 1 month Laural Benes, Binnie Rail, MD [facility] [place] - A Dept Of [name] Bridegroom. [facility]

## 2023-03-09 ENCOUNTER — Other Ambulatory Visit: Payer: Self-pay

## 2023-03-11 ENCOUNTER — Other Ambulatory Visit: Payer: Self-pay

## 2023-03-17 ENCOUNTER — Other Ambulatory Visit: Payer: Self-pay

## 2023-03-18 ENCOUNTER — Other Ambulatory Visit: Payer: Self-pay

## 2023-03-21 ENCOUNTER — Other Ambulatory Visit: Payer: Self-pay | Admitting: Physician Assistant

## 2023-03-21 ENCOUNTER — Other Ambulatory Visit: Payer: Self-pay

## 2023-03-21 DIAGNOSIS — G894 Chronic pain syndrome: Secondary | ICD-10-CM

## 2023-03-21 DIAGNOSIS — M545 Low back pain, unspecified: Secondary | ICD-10-CM

## 2023-03-21 MED ORDER — TRAMADOL HCL 50 MG PO TABS
50.0000 mg | ORAL_TABLET | Freq: Four times a day (QID) | ORAL | 0 refills | Status: DC | PRN
Start: 2023-03-21 — End: 2023-04-14
  Filled 2023-03-21: qty 120, 30d supply, fill #0

## 2023-03-22 ENCOUNTER — Other Ambulatory Visit: Payer: Self-pay

## 2023-03-29 ENCOUNTER — Other Ambulatory Visit: Payer: Self-pay

## 2023-04-08 ENCOUNTER — Other Ambulatory Visit: Payer: Self-pay | Admitting: Internal Medicine

## 2023-04-08 ENCOUNTER — Ambulatory Visit: Payer: 59 | Attending: Internal Medicine | Admitting: Internal Medicine

## 2023-04-08 DIAGNOSIS — R269 Unspecified abnormalities of gait and mobility: Secondary | ICD-10-CM | POA: Diagnosis not present

## 2023-04-08 DIAGNOSIS — M545 Low back pain, unspecified: Secondary | ICD-10-CM

## 2023-04-08 DIAGNOSIS — M5416 Radiculopathy, lumbar region: Secondary | ICD-10-CM | POA: Diagnosis not present

## 2023-04-08 DIAGNOSIS — Z794 Long term (current) use of insulin: Secondary | ICD-10-CM

## 2023-04-08 DIAGNOSIS — E1169 Type 2 diabetes mellitus with other specified complication: Secondary | ICD-10-CM

## 2023-04-08 DIAGNOSIS — I152 Hypertension secondary to endocrine disorders: Secondary | ICD-10-CM | POA: Diagnosis not present

## 2023-04-08 DIAGNOSIS — Z79891 Long term (current) use of opiate analgesic: Secondary | ICD-10-CM

## 2023-04-08 DIAGNOSIS — E1159 Type 2 diabetes mellitus with other circulatory complications: Secondary | ICD-10-CM | POA: Diagnosis not present

## 2023-04-08 DIAGNOSIS — M792 Neuralgia and neuritis, unspecified: Secondary | ICD-10-CM

## 2023-04-08 DIAGNOSIS — N182 Chronic kidney disease, stage 2 (mild): Secondary | ICD-10-CM

## 2023-04-08 DIAGNOSIS — G894 Chronic pain syndrome: Secondary | ICD-10-CM

## 2023-04-08 DIAGNOSIS — Z7985 Long-term (current) use of injectable non-insulin antidiabetic drugs: Secondary | ICD-10-CM

## 2023-04-08 DIAGNOSIS — Z6837 Body mass index (BMI) 37.0-37.9, adult: Secondary | ICD-10-CM

## 2023-04-08 DIAGNOSIS — J439 Emphysema, unspecified: Secondary | ICD-10-CM | POA: Diagnosis not present

## 2023-04-08 LAB — POCT GLYCOSYLATED HEMOGLOBIN (HGB A1C): HbA1c, POC (controlled diabetic range): 8.3 % — AB (ref 0.0–7.0)

## 2023-04-08 LAB — GLUCOSE, POCT (MANUAL RESULT ENTRY): POC Glucose: 129 mg/dL — AB (ref 70–99)

## 2023-04-08 NOTE — Progress Notes (Unsigned)
Patient ID: Joshua [name], male    DOB: April 03, 1956  MRN: [id number]  CC: Hypertension (HTN f/u. Med refill. [place] Stain on L leg X2 mo/No to flu vax.)   Subjective: Joshua [name] is a 67 y.o. male who presents for chronic ds management. His concerns today include:  Patient with history of DM type II, HTN, PAD, tobacco dependence, COPD spinal stenosis lumbar spine, migraines, GAD/schizoaffective bipolar type   Patient Active Problem List   Diagnosis Date Noted   COPD with acute exacerbation (HCC) 08/03/2022   Statin myopathy 12/09/2021   Spinal stenosis of lumbar region 02/19/2020   Tobacco dependence 08/05/2019   Type 2 diabetes mellitus with hyperglycemia (HCC) 05/14/2019   Peripheral vascular disease due to secondary diabetes (HCC) 05/14/2019   Chronic low back pain with left-sided sciatica 05/14/2019   COPD with chronic bronchitis (HCC) 03/27/2019   Schizoaffective disorder (HCC) 07/24/2018   Chronic pain syndrome 10/14/2016   Osteoarthritis 10/14/2016   HTN (hypertension) 10/14/2016   Migraine headache without aura 10/14/2016   Schizoaffective disorder, bipolar type (HCC) 05/10/2016   Generalized anxiety disorder 05/04/2016     Current Outpatient Medications on File Prior to Visit  Medication Sig Dispense Refill   albuterol (PROAIR HFA) 108 (90 Base) MCG/ACT inhaler Inhale 2 puffs into the lungs every 4 (four) hours as needed for wheezing or shortness of breath. 8.5 g 3   diclofenac (VOLTAREN) 50 MG EC tablet Take 1 tablet (50 mg total) by mouth 2 (two) times daily as needed for moderate pain. Eat before taking the medicine. 60 tablet 2   fluticasone-salmeterol (ADVAIR) 250-50 MCG/ACT AEPB Inhale 1 puff into the lungs 2 (two) times daily. 60 each 6   gabapentin (NEURONTIN) 300 MG capsule Take 2 capsules (600 mg total) by mouth 3 (three) times daily. 180 capsule 3   glucose blood (ACCU-CHEK GUIDE) test strip Use as instructed to check blood sugars three times per day. 100  each 11   insulin glargine (LANTUS) 100 unit/mL SOPN Inject 38 Units into the skin at bedtime. 15 mL 4   insulin lispro (HUMALOG KWIKPEN) 100 UNIT/ML KwikPen Inject 10 Units into the skin 3 (three) times daily. 15 mL 0   Insulin Pen Needle (PEN NEEDLES) 32G X 4 MM MISC Use to inject insulin four times daily. Also, Ozempic once weekly. 100 each 0   ipratropium-albuterol (DUONEB) 0.5-2.5 (3) MG/3ML SOLN Take 3 mLs by nebulization every 6 (six) hours as needed (shortness of breath). 180 mL 1   Lancets Misc. (ACCU-CHEK FASTCLIX LANCET) KIT Use to check blood sugars 3 times per day 1 kit 11   omeprazole (PRILOSEC) 20 MG capsule Take 1 capsule (20 mg total) by mouth daily. 90 capsule 1   SUMAtriptan (IMITREX) 50 MG tablet Take 1 tablet (50 mg total) by mouth every 2 (two) hours as needed for migraine. May repeat in 2 hours if headache persists or recurs. 10 tablet 0   traMADol (ULTRAM) 50 MG tablet Take 1 tablet (50 mg total) by mouth every 6 (six) hours as needed for moderate pain (pain score 4-6) or severe pain (pain score 7-10). 120 tablet 0   valsartan-hydrochlorothiazide (DIOVAN-HCT) 320-25 MG tablet Take 1 tablet by mouth daily. 90 tablet 3   Accu-Chek FastClix Lancets MISC Use to check blood sugars three times per day (Patient not taking: Reported on 04/08/2023) 102 each 11   amLODipine (NORVASC) 5 MG tablet Take 1 tablet (5 mg total) by mouth daily. (Patient not taking: Reported  on 04/08/2023) 90 tablet 2   blood glucose meter kit and supplies KIT Dispense based on patient and insurance preference. Use up to four times daily as directed. ICD-10 E11.65  Z79.4 (Patient not taking: Reported on 04/08/2023) 1 each 0   Blood Glucose Monitoring Suppl (ACCU-CHEK GUIDE ME) w/Device KIT Use to check blood sugars up to 3 times per day. ICD-10 E11.65, Z79.4 (Patient not taking: Reported on 04/08/2023) 1 kit 0   Blood Pressure Monitoring (BLOOD PRESSURE KIT) DEVI Use to measure blood pressure (Patient not taking:  Reported on 04/08/2023) 1 each 0   cetirizine (ZYRTEC) 10 MG tablet Take 1 tablet (10 mg total) by mouth daily. (Patient not taking: Reported on 04/08/2023) 30 tablet 11   hydrOXYzine (ATARAX) 50 MG tablet Take 1 tablet (50 mg total) by mouth 3 (three) times daily. (Patient not taking: Reported on 04/08/2023) 90 tablet 2   paliperidone (INVEGA) 6 MG 24 hr tablet Take 6 mg by mouth at bedtime. (Patient not taking: Reported on 04/08/2023)     predniSONE (DELTASONE) 20 MG tablet Take 2 tablets (40 mg total) by mouth daily with breakfast. (Patient not taking: Reported on 04/08/2023) 14 tablet 0   promethazine-dextromethorphan (PROMETHAZINE-DM) 6.25-15 MG/5ML syrup Take 5 mLs by mouth 4 (four) times daily as needed. (Patient not taking: Reported on 04/08/2023) 118 mL 0   Respiratory Therapy Supplies (FLUTTER) DEVI Use with 4 times daily (Patient not taking: Reported on 04/08/2023) 1 each 0   Semaglutide, 2 MG/DOSE, 8 MG/3ML SOPN Inject 2 mg as directed once a week. (Patient not taking: Reported on 04/08/2023) 9 mL 2   Spacer/Aero-Holding Chambers (AEROCHAMBER MV) inhaler Use as instructed (Patient not taking: Reported on 04/08/2023) 1 each 0   No current facility-administered medications on file prior to visit.    Allergies  Allergen Reactions   Statins Other (See Comments)    Myopathy   Haldol [Haloperidol] Other (See Comments)    Unk reaction   Metformin And Related Diarrhea and Nausea And Vomiting   Trazodone And Nefazodone Itching    Social History   Socioeconomic History   Marital status: Married    Spouse name: Not on file   Number of children: Not on file   Years of education: Not on file   Highest education level: 9th grade  Occupational History   Not on file  Tobacco Use   Smoking status: Every Day    Current packs/day: 1.00    Average packs/day: 1 pack/day for 50.0 years (50.0 ttl pk-yrs)    Types: Cigarettes   Smokeless tobacco: Never  Vaping Use   Vaping status: Never  Used  Substance and Sexual Activity   Alcohol use: Not Currently   Drug use: Yes    Types: Marijuana    Comment: "have changed to hemp now" 09/17/17   Sexual activity: Yes  Other Topics Concern   Not on file  Social History Narrative   Not on file   Social Drivers of Health   Financial Resource Strain: Medium Risk (04/08/2023)   Overall Financial Resource Strain (CARDIA)    Difficulty of Paying Living Expenses: Somewhat hard  Food Insecurity: Food Insecurity Present (04/08/2023)   Hunger Vital Sign    Worried About Running Out of Food in the Last Year: Sometimes true    Ran Out of Food in the Last Year: Never true  Transportation Needs: No Transportation Needs (04/08/2023)   PRAPARE - Administrator, Civil Service (Medical): No  Lack of Transportation (Non-Medical): No  Physical Activity: Inactive (04/08/2023)   Exercise Vital Sign    Days of Exercise per Week: 0 days    Minutes of Exercise per Session: 0 min  Stress: No Stress Concern Present (04/08/2023)   [employer] of Occupational Health - Occupational Stress Questionnaire    Feeling of Stress : Not at all  Social Connections: Moderately Isolated (04/08/2023)   Social Connection and Isolation Panel [NHANES]    Frequency of Communication with Friends and Family: More than three times a week    Frequency of Social Gatherings with Friends and Family: More than three times a week    Attends Religious Services: Never    Database administrator or Organizations: No    Attends Banker Meetings: Never    Marital Status: Married  Catering manager Violence: Not At Risk (09/29/2022)   Humiliation, Afraid, Rape, and Kick questionnaire    Fear of Current or Ex-Partner: No    Emotionally Abused: No    Physically Abused: No    Sexually Abused: No    Family History  Problem Relation Age of Onset   Diabetes Mother    Cancer Mother        unsure what type   Heart disease Mother    Hypertension  Mother    Dementia Father    Diabetes Brother    Dementia Maternal Grandfather    Cancer Paternal Grandmother 67       breast   Mental illness Neg Hx     Past Surgical History:  Procedure Laterality Date   APPENDECTOMY  1970    ROS: Review of Systems Negative except as stated above  PHYSICAL EXAM: BP 106/72 (BP Location: Left Arm, Patient Position: Sitting, Cuff Size: Normal)   Pulse 78   Temp 97.6 F (36.4 C) (Oral)   Ht 5\' 9"  (1.753 m)   Wt 255 lb (115.7 kg)   SpO2 94%   BMI 37.66 kg/m   Wt Readings from Last 3 Encounters:  04/08/23 255 lb (115.7 kg)  12/07/22 256 lb 9.6 oz (116.4 kg)  09/29/22 259 lb (117.5 kg)    Physical Exam   {male adult master:[id number]}     Latest Ref Rng & Units 08/03/2022    2:56 PM 05/03/2022    3:06 PM 02/16/2022    9:52 AM  CMP  Glucose 70 - 99 mg/dL 77  161  096   BUN 8 - 27 mg/dL 24  26  38   Creatinine 0.76 - 1.27 mg/dL 0.45  4.09  8.11   Sodium 134 - 144 mmol/L 134  133  129   Potassium 3.5 - 5.2 mmol/L 4.8  4.7  5.5   Chloride 96 - 106 mmol/L 97  94  94   CO2 20 - 29 mmol/L 22  22  17    Calcium 8.6 - 10.2 mg/dL 9.9  9.7  9.7   Total Protein 6.0 - 8.5 g/dL  6.5    Total Bilirubin 0.0 - 1.2 mg/dL  0.2    Alkaline Phos 44 - 121 IU/L  131    AST 0 - 40 IU/L  27    ALT 0 - 44 IU/L  45     Lipid Panel     Component Value Date/Time   CHOL 175 08/03/2022 1456   TRIG 171 (H) 08/03/2022 1456   HDL 50 08/03/2022 1456   CHOLHDL 3.5 08/03/2022 1456   CHOLHDL 3.3 09/30/2016 9147  VLDL 35 09/30/2016 0651   LDLCALC 96 08/03/2022 1456    CBC    Component Value Date/Time   WBC 10.0 08/23/2021 0329   RBC 5.08 08/23/2021 0329   HGB 15.4 08/23/2021 0329   HGB 17.2 07/09/2021 1630   HCT 46.9 08/23/2021 0329   HCT 50.8 07/09/2021 1630   PLT 216 08/23/2021 0329   PLT 237 07/09/2021 1630   MCV 92.3 08/23/2021 0329   MCV 90 07/09/2021 1630   MCH 30.3 08/23/2021 0329   MCHC 32.8 08/23/2021 0329   RDW 14.1 08/23/2021 0329   RDW  13.6 07/09/2021 1630   LYMPHSABS 1.5 07/23/2021 1242   LYMPHSABS 2.7 07/09/2021 1630   MONOABS 0.5 07/23/2021 1242   EOSABS 0.0 07/23/2021 1242   EOSABS 0.1 07/09/2021 1630   BASOSABS 0.0 07/23/2021 1242   BASOSABS 0.1 07/09/2021 1630    ASSESSMENT AND PLAN:  Assessment and Plan              1. Type 2 diabetes mellitus with hyperglycemia, with long-term current use of insulin (HCC) (Primary) *** - POCT glycosylated hemoglobin (Hb A1C) - POCT glucose (manual entry)    Patient was given the opportunity to ask questions.  Patient verbalized understanding of the plan and was able to repeat key elements of the plan.   This documentation was completed using Paediatric nurse.  Any transcriptional errors are unintentional.  Orders Placed This Encounter  Procedures   POCT glycosylated hemoglobin (Hb A1C)   POCT glucose (manual entry)     Requested Prescriptions    No prescriptions requested or ordered in this encounter    No follow-ups on file.  Jonah Blue, MD, FACP

## 2023-04-08 NOTE — Patient Instructions (Addendum)
Please use the Cologuard test and send them as soon as possible for colon cancer screening. Please let me know if you change your mind in getting some physical therapy. Please check your blood sugars twice a day before breakfast and before dinner and record your readings.  We will bring you back to see the clinical pharmacist in 3 weeks.  Please bring your readings with you.

## 2023-04-09 LAB — BASIC METABOLIC PANEL
BUN/Creatinine Ratio: 23 (ref 10–24)
BUN: 30 mg/dL — ABNORMAL HIGH (ref 8–27)
CO2: 23 mmol/L (ref 20–29)
Calcium: 9.4 mg/dL (ref 8.6–10.2)
Chloride: 98 mmol/L (ref 96–106)
Creatinine, Ser: 1.3 mg/dL — ABNORMAL HIGH (ref 0.76–1.27)
Glucose: 107 mg/dL — ABNORMAL HIGH (ref 70–99)
Potassium: 4.9 mmol/L (ref 3.5–5.2)
Sodium: 138 mmol/L (ref 134–144)
eGFR: 61 mL/min/{1.73_m2} (ref >59)

## 2023-04-10 DIAGNOSIS — Z79891 Long term (current) use of opiate analgesic: Secondary | ICD-10-CM | POA: Insufficient documentation

## 2023-04-10 DIAGNOSIS — R269 Unspecified abnormalities of gait and mobility: Secondary | ICD-10-CM | POA: Insufficient documentation

## 2023-04-14 ENCOUNTER — Other Ambulatory Visit: Payer: Self-pay | Admitting: Critical Care Medicine

## 2023-04-14 ENCOUNTER — Other Ambulatory Visit: Payer: Self-pay

## 2023-04-14 ENCOUNTER — Other Ambulatory Visit: Payer: Self-pay | Admitting: Physician Assistant

## 2023-04-14 DIAGNOSIS — M545 Low back pain, unspecified: Secondary | ICD-10-CM

## 2023-04-14 DIAGNOSIS — G43009 Migraine without aura, not intractable, without status migrainosus: Secondary | ICD-10-CM

## 2023-04-14 DIAGNOSIS — G894 Chronic pain syndrome: Secondary | ICD-10-CM

## 2023-04-14 DIAGNOSIS — G8929 Other chronic pain: Secondary | ICD-10-CM

## 2023-04-14 DIAGNOSIS — G629 Polyneuropathy, unspecified: Secondary | ICD-10-CM

## 2023-04-14 MED ORDER — SUMATRIPTAN SUCCINATE 50 MG PO TABS
50.0000 mg | ORAL_TABLET | ORAL | 1 refills | Status: DC | PRN
  Filled 2023-04-14: qty 9, 30d supply, fill #0
  Filled 2023-05-17: qty 9, 30d supply, fill #1
  Filled 2023-06-15: qty 2, 1d supply, fill #2

## 2023-04-14 NOTE — Telephone Encounter (Signed)
Requested Prescriptions  Pending Prescriptions Disp Refills   SUMAtriptan (IMITREX) 50 MG tablet 10 tablet 1    Sig: Take 1 tablet (50 mg total) by mouth every 2 (two) hours as needed for migraine. May repeat in 2 hours if headache persists or recurs.     Neurology:  Migraine Therapy - Triptan Passed - 04/14/2023  2:03 PM      Passed - Last BP in normal range    BP Readings from Last 1 Encounters:  04/08/23 106/72         Passed - Valid encounter within last 12 months    Recent Outpatient Visits           6 days ago Type 2 diabetes mellitus with morbid obesity (HCC)   [place] [facility] [place] - [facility] Dept Of [place]. [facility] Jonah Blue B, MD   4 months ago Type 2 diabetes mellitus with hyperglycemia, with long-term current use of insulin [facility])   [place] [facility] - A Dept Of [place]. [facility] Storm Frisk, MD   8 months ago Type 2 diabetes mellitus with hyperglycemia, with long-term current use of insulin [facility])   [place] [facility] - A Dept Of [place]. [facility] Storm Frisk, MD   11 months ago Type 2 diabetes mellitus with hyperglycemia, with long-term current use of insulin [facility])   [place] [facility] - A Dept Of [place]. [facility] [place], [place], [place]   1 year ago Type 2 diabetes mellitus with hyperglycemia, with long-term current use of insulin [facility])   [place] [facility] - A Dept Of [place]. [facility] Drucilla Chalet, RPH-CPP       Future Appointments             In 2 weeks Lois Huxley, Cornelius Moras, RPH-CPP [place] [facility] - A Dept Of . [facility]   In 4 months Laural Benes, Binnie Rail, MD [facility] [place] - A Dept Of [name] Bridegroom. [facility]

## 2023-04-16 MED ORDER — TRAMADOL HCL 50 MG PO TABS
50.0000 mg | ORAL_TABLET | Freq: Four times a day (QID) | ORAL | 0 refills | Status: DC | PRN
  Filled 2023-04-16: qty 120, 30d supply, fill #0

## 2023-04-16 MED ORDER — GABAPENTIN 300 MG PO CAPS
600.0000 mg | ORAL_CAPSULE | Freq: Three times a day (TID) | ORAL | 3 refills | Status: DC
  Filled 2023-04-16: qty 180, 30d supply, fill #0
  Filled 2023-05-17: qty 180, 30d supply, fill #1
  Filled 2023-06-15 – 2023-06-16 (×2): qty 180, 30d supply, fill #2
  Filled 2023-07-14: qty 180, 30d supply, fill #3

## 2023-04-18 ENCOUNTER — Other Ambulatory Visit: Payer: Self-pay

## 2023-04-25 ENCOUNTER — Other Ambulatory Visit: Payer: Self-pay | Admitting: Critical Care Medicine

## 2023-04-25 DIAGNOSIS — G8929 Other chronic pain: Secondary | ICD-10-CM

## 2023-04-25 DIAGNOSIS — E1165 Type 2 diabetes mellitus with hyperglycemia: Secondary | ICD-10-CM

## 2023-04-26 ENCOUNTER — Other Ambulatory Visit: Payer: Self-pay

## 2023-04-26 MED ORDER — PEN NEEDLES 32G X 4 MM MISC
6 refills | Status: DC
  Filled 2023-04-26: qty 100, 25d supply, fill #0
  Filled 2023-05-19: qty 100, 25d supply, fill #1
  Filled 2023-06-15: qty 100, 25d supply, fill #2
  Filled 2023-07-14: qty 100, 25d supply, fill #3
  Filled 2023-08-22: qty 100, 25d supply, fill #4
  Filled 2023-09-12: qty 100, 25d supply, fill #5
  Filled 2023-10-10: qty 100, 25d supply, fill #6

## 2023-04-26 MED ORDER — DICLOFENAC SODIUM 50 MG PO TBEC
50.0000 mg | DELAYED_RELEASE_TABLET | Freq: Two times a day (BID) | ORAL | 2 refills | Status: DC | PRN
  Filled 2023-04-26: qty 60, 30d supply, fill #0
  Filled 2023-05-26: qty 60, 30d supply, fill #1
  Filled 2023-06-28: qty 60, 30d supply, fill #2

## 2023-04-28 ENCOUNTER — Other Ambulatory Visit: Payer: Self-pay

## 2023-04-29 ENCOUNTER — Encounter: Payer: Self-pay | Admitting: Pharmacist

## 2023-04-29 ENCOUNTER — Ambulatory Visit: Payer: 59 | Attending: Internal Medicine | Admitting: Pharmacist

## 2023-04-29 DIAGNOSIS — E1165 Type 2 diabetes mellitus with hyperglycemia: Secondary | ICD-10-CM

## 2023-04-29 DIAGNOSIS — Z794 Long term (current) use of insulin: Secondary | ICD-10-CM

## 2023-04-29 DIAGNOSIS — Z7985 Long-term (current) use of injectable non-insulin antidiabetic drugs: Secondary | ICD-10-CM

## 2023-04-29 NOTE — Progress Notes (Signed)
 S:    PCP: Dr. Vicci   No chief complaint on file.  I connected with Joshua [name] on 04/29/2023 at 1:30 PM by telephone and verified that I am speaking with the correct person using two identifiers.   I discussed the limitations, risks, security and privacy concerns of performing an evaluation and management service by telephonr and the availability of in person appointments. I also discussed with the patient that there may be a patient responsible charge related to this service. The patient expressed understanding and agreed to proceed.   Patient location:  home   My Location:  home office    Persons on the call:  myself and the patient    Patient in good spirits.  Presents for diabetes evaluation, education, and management. Patient was last seen by Primary Care Provider 04/08/2023. A1c was 8.3% at that visit.   Today, pt reports diabetes is longstanding. He is compliant with weekly Ozempic  and his insulin . He only deviates from his rx'd Humalog  dose if he skips a meal. Today's appt was converted to telephone because he has a cold and he is still experiencing leg pain. He denies any hypoglycemia and is not currently experiencing any hyperglycemia-associated symptoms. He is tolerating the Ozempic  well and denies any NV, abdominal pain, or changes in vision.   Family/Social History:  -Current smoker -Denies any alcohol use  Insurance coverage/medication affordability: Medicare/Medicaid  Current diabetes medications include:  -Lantus  38 units daily -Humalog  10u before meals -Ozempic  2mg  weekly   Patient reported dietary habits:  -Trying to limit himself to 1 meal day in an effort to lose weight  -Admits to dietary indiscretion; names [employer] bars  -Denies intake of sugar sweetened beverages   Patient-reported exercise habits: none   Patient denies nocturia (nighttime urination).  Patient denies neuropathy (nerve pain). Patient denies visual changes. Patient reports self foot  exams.    Patient-reported blood sugar: 100 - 130. Reports highest readings 190 after eating.   O:  Physical Exam  ROS   Lab Results  Component Value Date   HGBA1C 8.3 (A) 04/08/2023   There were no vitals filed for this visit.  Lipid Panel     Component Value Date/Time   CHOL 175 08/03/2022 1456   TRIG 171 (H) 08/03/2022 1456   HDL 50 08/03/2022 1456   CHOLHDL 3.5 08/03/2022 1456   CHOLHDL 3.3 09/30/2016 0651   VLDL 35 09/30/2016 0651   LDLCALC 96 08/03/2022 1456    Clinical Atherosclerotic Cardiovascular Disease (ASCVD): No  The 10-year ASCVD risk score (Arnett DK, et al., 2019) is: 24.5%   Values used to calculate the score:     Age: 68 years     Sex: Male     Is Non-Hispanic African American: No     Diabetic: Yes     Tobacco smoker: Yes     Systolic Blood Pressure: 106 mmHg     Is BP treated: Yes     HDL Cholesterol: 50 mg/dL     Total Cholesterol: 175 mg/dL    A/P: Diabetes longstanding and currently not at goal. However, home CBGs reveal improvement since last month. I am unavble to verify his reported sugars but those he reports are at goal. He admits that his A1c wasa likely elevated d/t to dietary indiscretion with increased consumption of sugary foods around the holidays. Patient is able to verbalize appropriate hypoglycemia management plan.  -Continue current regimen. -Extensively discussed pathophysiology of diabetes, recommended lifestyle interventions, dietary effects on blood  sugar control -Counseled on s/sx of and management of hypoglycemia -Next A1C anticipated 06/2023.  Written patient instructions provided.  Total time spent in counseling: 20 minutes.   Follow up with me in 1 month.     Herlene Fleeta Morris, PharmD, JAQUELINE, CPP Clinical Pharmacist [facility] [phone]

## 2023-05-17 ENCOUNTER — Other Ambulatory Visit: Payer: Self-pay

## 2023-05-17 ENCOUNTER — Other Ambulatory Visit: Payer: Self-pay | Admitting: Critical Care Medicine

## 2023-05-17 DIAGNOSIS — Z794 Long term (current) use of insulin: Secondary | ICD-10-CM

## 2023-05-17 MED ORDER — INSULIN LISPRO (1 UNIT DIAL) 100 UNIT/ML (KWIKPEN)
10.0000 [IU] | PEN_INJECTOR | Freq: Three times a day (TID) | SUBCUTANEOUS | 0 refills | Status: DC
  Filled 2023-05-17: qty 15, 50d supply, fill #0

## 2023-05-19 ENCOUNTER — Other Ambulatory Visit: Payer: Self-pay | Admitting: Internal Medicine

## 2023-05-19 ENCOUNTER — Other Ambulatory Visit: Payer: Self-pay

## 2023-05-19 DIAGNOSIS — G894 Chronic pain syndrome: Secondary | ICD-10-CM

## 2023-05-19 DIAGNOSIS — M545 Low back pain, unspecified: Secondary | ICD-10-CM

## 2023-05-19 NOTE — Telephone Encounter (Signed)
Requested medication (s) are due for refill today: Yes  Requested medication (s) are on the active medication list: Yes  Last refill:  04/16/23  Future visit scheduled: Yes  Notes to clinic:  Unable to refill per protocol, cannot delegate.      Requested Prescriptions  Pending Prescriptions Disp Refills   traMADol (ULTRAM) 50 MG tablet 120 tablet 0    Sig: Take 1 tablet (50 mg total) by mouth every 6 (six) hours as needed for moderate pain (pain score 4-6) or severe pain (pain score 7-10).     Not Delegated - Analgesics:  Opioid Agonists Failed - 05/19/2023  4:46 PM      Failed - This refill cannot be delegated      Failed - Urine Drug Screen completed in last 360 days      Passed - Valid encounter within last 3 months    Recent Outpatient Visits           2 weeks ago Type 2 diabetes mellitus with hyperglycemia, with long-term current use of insulin (HCC)   [place] [facility] [place] - A Dept Of [place]. [facility] Lois Huxley, [place] L, RPH-CPP   1 month ago Type 2 diabetes mellitus with morbid obesity (HCC)   [place] [facility] - A Dept Of [place]. [facility] Jonah Blue B, MD   5 months ago Type 2 diabetes mellitus with hyperglycemia, with long-term current use of insulin [facility])   [place] [facility] - A Dept Of [place]. [facility] Storm Frisk, MD   9 months ago Type 2 diabetes mellitus with hyperglycemia, with long-term current use of insulin [facility])   [place] [facility] - A Dept Of [facility]. [facility] Storm Frisk, MD   1 year ago Type 2 diabetes mellitus with hyperglycemia, with long-term current use of insulin [facility])   [place] [facility] - A Dept Of [place]. [facility] [place], Marzella Schlein, [place]       Future Appointments             In 2 months Laural Benes, Binnie Rail, MD [facility] - A Dept Of [name] Bridegroom. [facility]

## 2023-05-20 MED ORDER — TRAMADOL HCL 50 MG PO TABS
50.0000 mg | ORAL_TABLET | Freq: Four times a day (QID) | ORAL | 1 refills | Status: DC | PRN
  Filled 2023-05-20: qty 120, 30d supply, fill #0
  Filled 2023-06-20: qty 120, 30d supply, fill #1

## 2023-05-23 ENCOUNTER — Other Ambulatory Visit: Payer: Self-pay

## 2023-05-27 ENCOUNTER — Other Ambulatory Visit: Payer: Self-pay

## 2023-05-30 ENCOUNTER — Other Ambulatory Visit: Payer: Self-pay

## 2023-06-03 ENCOUNTER — Ambulatory Visit: Payer: 59 | Attending: Internal Medicine | Admitting: Pharmacist

## 2023-06-03 ENCOUNTER — Encounter: Payer: Self-pay | Admitting: Pharmacist

## 2023-06-03 DIAGNOSIS — Z794 Long term (current) use of insulin: Secondary | ICD-10-CM

## 2023-06-03 DIAGNOSIS — E1165 Type 2 diabetes mellitus with hyperglycemia: Secondary | ICD-10-CM

## 2023-06-03 DIAGNOSIS — Z7985 Long-term (current) use of injectable non-insulin antidiabetic drugs: Secondary | ICD-10-CM

## 2023-06-03 NOTE — Progress Notes (Signed)
 S:    PCP: Dr. Vicci   No chief complaint on file.  I connected with Joshua [name] on 06/03/2023 at 1:30 PM by telephone and verified that I am speaking with the correct person using two identifiers.   I discussed the limitations, risks, security and privacy concerns of performing an evaluation and management service by telephonr and the availability of in person appointments. I also discussed with the patient that there may be a patient responsible charge related to this service. The patient expressed understanding and agreed to proceed.   Patient location:  home   My Location:  home office    Persons on the call:  myself and the patient    Patient in good spirits.  Presents for diabetes evaluation, education, and management. Patient was last seen by Primary Care Provider 04/08/2023. A1c was 8.3% at that visit.   Today, pt reports diabetes is longstanding. He is compliant with weekly Ozempic  and his insulin . He only deviates from his rx'd Humalog  dose if he skips a meal. Today's appt was converted to telephone because he has a cold and he is still experiencing leg pain. He denies any hypoglycemia and is not currently experiencing any hyperglycemia-associated symptoms. He is tolerating the Ozempic  well and denies any NV, abdominal pain, or changes in vision.   Family/Social History:  -Current smoker -Denies any alcohol use  Insurance coverage/medication affordability: Medicare/Medicaid  Current diabetes medications include:  -Lantus  38 units daily -Humalog  10u before meals (usually takes 1-2x daily) -Ozempic  2mg  weekly   Patient reported dietary habits:  -Trying to limit himself to 1 meal day in an effort to lose weight   Patient-reported exercise habits: none   Patient denies nocturia (nighttime urination).  Patient denies neuropathy (nerve pain). Patient denies visual changes. Patient reports self foot exams.    Patient-reported blood sugar: 100 - 130 reported. Tells me  mostly in the 90s-120s.   O:  Physical Exam  ROS  Lab Results  Component Value Date   HGBA1C 8.3 (A) 04/08/2023   There were no vitals filed for this visit.  Lipid Panel     Component Value Date/Time   CHOL 175 08/03/2022 1456   TRIG 171 (H) 08/03/2022 1456   HDL 50 08/03/2022 1456   CHOLHDL 3.5 08/03/2022 1456   CHOLHDL 3.3 09/30/2016 0651   VLDL 35 09/30/2016 0651   LDLCALC 96 08/03/2022 1456    Clinical Atherosclerotic Cardiovascular Disease (ASCVD): No  The 10-year ASCVD risk score (Arnett DK, et al., 2019) is: 24.5%   Values used to calculate the score:     Age: 68 years     Sex: Male     Is Non-Hispanic African American: No     Diabetic: Yes     Tobacco smoker: Yes     Systolic Blood Pressure: 106 mmHg     Is BP treated: Yes     HDL Cholesterol: 50 mg/dL     Total Cholesterol: 175 mg/dL    A/P: Diabetes longstanding and currently not at goal. However, home CBGs reveal improvement since last month. I am unavble to verify his reported sugars but those he reports are at goal. He admits that his A1c wasa likely elevated d/t to dietary indiscretion with increased consumption of sugary foods around the holidays. Patient is able to verbalize appropriate hypoglycemia management plan.  -Continue current regimen. -Extensively discussed pathophysiology of diabetes, recommended lifestyle interventions, dietary effects on blood sugar control -Counseled on s/sx of and management of hypoglycemia -Next  A1C anticipated 06/2023.  Written patient instructions provided.  Total time spent in counseling: 20 minutes.   Follow up with me in 1 month.     Joshua [name], PharmD, Joshua [name], CPP Clinical Pharmacist [facility] [phone]

## 2023-06-13 ENCOUNTER — Other Ambulatory Visit: Payer: Self-pay

## 2023-06-16 ENCOUNTER — Other Ambulatory Visit: Payer: Self-pay

## 2023-06-17 ENCOUNTER — Other Ambulatory Visit: Payer: Self-pay

## 2023-06-20 ENCOUNTER — Other Ambulatory Visit: Payer: Self-pay

## 2023-06-22 ENCOUNTER — Other Ambulatory Visit: Payer: Self-pay

## 2023-06-28 ENCOUNTER — Other Ambulatory Visit: Payer: Self-pay

## 2023-07-01 ENCOUNTER — Other Ambulatory Visit: Payer: Self-pay

## 2023-07-07 ENCOUNTER — Ambulatory Visit: Payer: Self-pay | Admitting: Internal Medicine

## 2023-07-07 NOTE — Telephone Encounter (Signed)
 Chief Complaint: Leg, thigh, groin,hip pain after fall from stairs Symptoms: pain, swelling, Frequency: 2 months Pertinent Negatives: Patient denies numbness or tingling, bruising Disposition: [x] ED /[] Urgent Care (no appt availability in office) / [] Appointment(In office/virtual)/ []  [facility]/ [] Home Care/ [] Refused Recommended Disposition /[] [place] [facility]/ []  Follow-up with PCP Additional Notes: Patient calls stating he had a fall two months ago off the 4th stair on his porch- states he slipped and jumped down because he did not want to fall on his face, which resulting in hurting his L leg. States he has severe pain with ambulation but 5/10 currently, reports swelling in that leg over the weekend but unsure if if it is swollen now because he cannot change his clothes or visualize his leg. Denies numbness or tingling, discoloration. Patient states he has been unable to leave the house since the fall because he cannot get down off the steps. Per protocol, patient to present to ED now for evaluation based of symptoms. This RN offered to call EMS for patient, patient states he does not want to go because he cannot smoke in the hospital. This RN had in depth discussion with the patient about the importance of being evaluated and patient states he may call EMS tomorrow or Monday. Care advice reviewed, contacted [facility], spoke with Zara regarding patient refusal. Alerting PCP for review and follow up.    Copied from CRM [id number]. Topic: Clinical - Red Word Triage >> Jul 07, 2023 11:46 AM Geroge Baseman wrote: Red Word that prompted transfer to Nurse Triage: Recently had a fall, terrible pain in his left leg, he said he cannot walk and it puts him into tears. Reason for Disposition  Patient sounds very sick or weak to the triager  Answer Assessment - Initial Assessment Questions 1. MECHANISM: "How did the fall happen?"     Going down the steps, fell down 4 steps, stepped down and leg  felt like it ripped apart  2. DOMESTIC VIOLENCE AND ELDER ABUSE SCREENING: "Did you fall because someone pushed you or tried to hurt you?" If Yes, ask: "Are you safe now?"     Denies 3. ONSET: "When did the fall happen?" (e.g., minutes, hours, or days ago)     Two months ago, has not been able to leave the house since. 4. LOCATION: "What part of the body hit the ground?" (e.g., back, buttocks, head, hips, knees, hands, head, stomach)     L leg 5. INJURY: "Did you hurt (injure) yourself when you fell?" If Yes, ask: "What did you injure? Tell me more about this?" (e.g., body area; type of injury; pain severity)"     Yes, pain in L leg. Feels it swollen over the weekend, cannot lift leg 6. PAIN: "Is there any pain?" If Yes, ask: "How bad is the pain?" (e.g., Scale 1-10; or mild,  moderate, severe)   - NONE (0): No pain   - MILD (1-3): Doesn't interfere with normal activities    - MODERATE (4-7): Interferes with normal activities or awakens from sleep    - SEVERE (8-10): Excruciating pain, unable to do any normal activities      10/10 at times, 5/10 currently 7. SIZE: For cuts, bruises, or swelling, ask: "How large is it?" (e.g., inches or centimeters)      Denies  9. OTHER SYMPTOMS: "Do you have any other symptoms?" (e.g., dizziness, fever, weakness; new onset or worsening).      A few dizzy spells 10. CAUSE: "What  do you think caused the fall (or falling)?" (e.g., tripped, dizzy spell)       Unsure, coming down stairs.  Protocols used: Falls and [facility]

## 2023-07-07 NOTE — Telephone Encounter (Signed)
 Spoke with patient. Patient continues to refuse ED , but did agree to go UC on tomorrow.

## 2023-07-11 DIAGNOSIS — M25552 Pain in left hip: Secondary | ICD-10-CM | POA: Diagnosis not present

## 2023-07-11 DIAGNOSIS — M545 Low back pain, unspecified: Secondary | ICD-10-CM | POA: Diagnosis not present

## 2023-07-11 DIAGNOSIS — M25562 Pain in left knee: Secondary | ICD-10-CM | POA: Diagnosis not present

## 2023-07-14 ENCOUNTER — Telehealth: Payer: Self-pay | Admitting: Internal Medicine

## 2023-07-14 ENCOUNTER — Other Ambulatory Visit: Payer: Self-pay | Admitting: Internal Medicine

## 2023-07-14 ENCOUNTER — Other Ambulatory Visit: Payer: Self-pay | Admitting: Critical Care Medicine

## 2023-07-14 ENCOUNTER — Other Ambulatory Visit: Payer: Self-pay

## 2023-07-14 DIAGNOSIS — Z794 Long term (current) use of insulin: Secondary | ICD-10-CM

## 2023-07-14 MED ORDER — OMEPRAZOLE 20 MG PO CPDR
20.0000 mg | DELAYED_RELEASE_CAPSULE | Freq: Every day | ORAL | 1 refills | Status: DC
  Filled 2023-07-14: qty 90, 90d supply, fill #0
  Filled 2023-10-16: qty 90, 90d supply, fill #1

## 2023-07-14 MED ORDER — ALBUTEROL SULFATE HFA 108 (90 BASE) MCG/ACT IN AERS
2.0000 | INHALATION_SPRAY | RESPIRATORY_TRACT | 3 refills | Status: DC | PRN
  Filled 2023-07-14: qty 8.5, 17d supply, fill #0
  Filled 2023-08-22: qty 8.5, 17d supply, fill #1
  Filled 2023-09-01 – 2023-10-10 (×2): qty 8.5, 17d supply, fill #2
  Filled 2023-12-23: qty 8.5, 17d supply, fill #3

## 2023-07-14 MED ORDER — INSULIN LISPRO (1 UNIT DIAL) 100 UNIT/ML (KWIKPEN)
10.0000 [IU] | PEN_INJECTOR | Freq: Three times a day (TID) | SUBCUTANEOUS | 0 refills | Status: DC
  Filled 2023-07-14: qty 15, 50d supply, fill #0

## 2023-07-14 NOTE — Telephone Encounter (Signed)
-----   Message from Corinna Capra sent at 07/11/2023  3:33 PM EDT ----- Regarding: Visit today for L hip AVN Dr. Laural Benes,  I had the pleasure of seeing Joshua [name] today in clinic for L hip pain. 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 I had a VERY LONG conversation with him regarding his co morbidities that currently place him in a state of severe risk for having any sort of operation. He informed me of 3 pack smoking history since his was 9, his wife was there as well. He is frequently doing breathing treatments at night and then smoking after. He also eats 2-3 king sized chocolate bars after dinner.  Our office will be reaching out for clearance, but we will also request Cardiac and Pulm clearance. He was tachypneic in clinic and wheezing (likely his baseline), but I do have significant concern regarding his total health. I am hoping when you see him next, we can get things going as far as screening items since he is likely behind on most things. I am sure you are very aware of this but just wanted to give you a heads up. We will not be able to operate on him until his A1C is <7.9 and his smoking managed. Hopefully you can help out with this.   Thanks!

## 2023-07-15 ENCOUNTER — Other Ambulatory Visit: Payer: Self-pay

## 2023-07-18 ENCOUNTER — Ambulatory Visit: Payer: Self-pay | Admitting: Internal Medicine

## 2023-07-18 NOTE — Telephone Encounter (Signed)
 Third attempt to contact patient regarding call for result and complaint of pain. No answr and message states "call can not be completed as dialed". Call will be forwarded to office for review .

## 2023-07-18 NOTE — Telephone Encounter (Signed)
 This RN made second attempt to contact patient and got the recording, "Your call could not be completed as dialed."

## 2023-07-18 NOTE — Telephone Encounter (Signed)
 This RN made first attempt to contact patient and got the recording, "Your call could not be completed as dialed."   Copied from CRM [id number]. Topic: Clinical - Lab/Test Results >> Jul 18, 2023  2:37 PM Tiffany S wrote: Reason for CRM: Patient had xray done [facility] patient is asking for results. Patient is having discomfort with his left leg please follow up with patient

## 2023-07-20 ENCOUNTER — Other Ambulatory Visit: Payer: Self-pay

## 2023-07-20 ENCOUNTER — Other Ambulatory Visit: Payer: Self-pay | Admitting: Critical Care Medicine

## 2023-07-20 ENCOUNTER — Other Ambulatory Visit: Payer: Self-pay | Admitting: Internal Medicine

## 2023-07-20 DIAGNOSIS — G894 Chronic pain syndrome: Secondary | ICD-10-CM

## 2023-07-20 DIAGNOSIS — E1165 Type 2 diabetes mellitus with hyperglycemia: Secondary | ICD-10-CM

## 2023-07-20 DIAGNOSIS — M545 Low back pain, unspecified: Secondary | ICD-10-CM

## 2023-07-20 MED ORDER — INSULIN GLARGINE 100 UNITS/ML SOLOSTAR PEN
38.0000 [IU] | PEN_INJECTOR | Freq: Every day | SUBCUTANEOUS | 4 refills | Status: DC
  Filled 2023-07-20: qty 15, 39d supply, fill #0
  Filled 2023-08-28: qty 15, 39d supply, fill #1
  Filled 2023-10-10: qty 15, 39d supply, fill #2
  Filled 2023-11-16: qty 15, 39d supply, fill #3
  Filled 2023-12-23: qty 15, 39d supply, fill #4

## 2023-07-20 NOTE — Telephone Encounter (Signed)
 Requested medication (s) are due for refill today - yes  Requested medication (s) are on the active medication list -yes  Future visit scheduled -yes  Last refill: 05/20/23 #120 1RF  Notes to clinic: non delegated Rx  Requested Prescriptions  Pending Prescriptions Disp Refills   traMADol (ULTRAM) 50 MG tablet 120 tablet 1    Sig: Take 1 tablet (50 mg total) by mouth every 6 (six) hours as needed for moderate pain (pain score 4-6) or severe pain (pain score 7-10).     Not Delegated - Analgesics:  Opioid Agonists Failed - 07/20/2023  3:49 PM      Failed - This refill cannot be delegated      Failed - Urine Drug Screen completed in last 360 days      Passed - Valid encounter within last 3 months    Recent Outpatient Visits           1 month ago Type 2 diabetes mellitus with hyperglycemia, with long-term current use of insulin (HCC)   [place] [facility] Of [place]. [facility] Lois Huxley, [place] L, RPH-CPP   2 months ago Type 2 diabetes mellitus with hyperglycemia, with long-term current use of insulin (HCC)   [place] [facility] Dept Of [place]. [facility] Lois Huxley, [place] L, RPH-CPP   3 months ago Type 2 diabetes mellitus with morbid obesity [facility])   [place] [facility] - A Dept Of [place]. [facility] Jonah Blue B, MD   7 months ago Type 2 diabetes mellitus with hyperglycemia, with long-term current use of insulin [facility])   [place] [facility] - A Dept Of [place]. [facility] Storm Frisk, MD   11 months ago Type 2 diabetes mellitus with hyperglycemia, with long-term current use of insulin [facility])   [place] [facility] - A Dept Of [place]. [facility] Storm Frisk, MD       Future Appointments             In 1 month Laural Benes Binnie Rail, MD [facility] [place] - A Dept Of [name] Bridegroom. [facility]                Requested Prescriptions  Pending Prescriptions Disp Refills   traMADol (ULTRAM) 50 MG tablet 120 tablet 1    Sig: Take 1 tablet (50 mg total) by mouth every 6 (six) hours as needed for moderate pain (pain score 4-6) or severe pain (pain score 7-10).     Not Delegated - Analgesics:  Opioid Agonists Failed - 07/20/2023  3:49 PM      Failed - This refill cannot be delegated      Failed - Urine Drug Screen completed in last 360 days      Passed - Valid encounter within last 3 months    Recent Outpatient Visits           1 month ago Type 2 diabetes mellitus with hyperglycemia, with long-term current use of insulin (HCC)   [place] [facility] - A Dept Of [place]. [facility] Lois Huxley, [place] L, RPH-CPP   2 months ago Type 2 diabetes mellitus with hyperglycemia, with long-term current use of insulin (HCC)    [facility] - A Dept Of [place]. [facility] Drucilla Chalet, RPH-CPP   3  months ago Type 2 diabetes mellitus with morbid obesity (HCC)   [place] [facility] - A Dept Of [place]. [facility] Jonah Blue B, MD   7 months ago Type 2 diabetes mellitus with hyperglycemia, with long-term current use of insulin [facility])   [place] [facility] - A Dept Of [place]. [facility] Storm Frisk, MD   11 months ago Type 2 diabetes mellitus with hyperglycemia, with long-term current use of insulin [facility])   [place] [facility] - A Dept Of . [facility] Storm Frisk, MD       Future Appointments             In 1 month Laural Benes Binnie Rail, MD [facility] [place] - A Dept Of [name] Bridegroom. [facility]

## 2023-07-21 ENCOUNTER — Other Ambulatory Visit: Payer: Self-pay

## 2023-07-21 ENCOUNTER — Ambulatory Visit: Payer: Self-pay | Admitting: Internal Medicine

## 2023-07-21 NOTE — Telephone Encounter (Signed)
 Copied from CRM [id number]. Topic: Clinical - Red Word Triage >> Jul 21, 2023  2:54 PM Priscille Loveless wrote: Red Word that prompted transfer to Nurse Triage: Pt states that he is in extreme pain and needs pain medicine now. Hip to his foot.  Chief Complaint: Left hip and knee pain Symptoms: Pain Frequency: Chronic, worsening recently Pertinent Negatives: Patient denies swelling Disposition: [] ED /[] Urgent Care (no appt availability in office) / [x] Appointment(In office/virtual)/ []  [facility]/ [] Home Care/ [x] Refused Recommended Disposition /[] [place] [facility]/ []  Follow-up with PCP Additional Notes: Patient called in to report pain in his left hip and knee. Patient stated pain is chronic, but has worsened recently. Patient unable to give a timeframe of when pain started worsening. Patient rated pain a 7 at this time. Patient stated the pain makes it difficult to walk. Patient denied redness and swelling. This RN advised patient to see provider before the weekend, per protocol. Patient stated he does not have transportation to office this week. Patient opted for virtual appointment. No availability within timeframe. Patient declined appointment at another office. This RN advised that I would route this conversation to the office for their discretion on scheduling. Patient also asked for an update on his tramadol refill. This RN called pharmacy. Medication is waiting for provider approval. Patient would like a call back from office to schedule an appointment.   Reason for Disposition  [1] MODERATE pain (e.g., interferes with normal activities, limping) AND [2] present > 3 days  Answer Assessment - Initial Assessment Questions 1. ONSET: "When did the pain start?"      States pain has been present for a long time, but it has been getting worse recently, unable to give a timeline 2. LOCATION: "Where is the pain located?"      Left hip and knee 3. PAIN: "How bad is the pain?"    (Scale 1-10;  or mild, moderate, severe)   -  MILD (1-3): doesn't interfere with normal activities    -  MODERATE (4-7): interferes with normal activities (e.g., work or school) or awakens from sleep, limping    -  SEVERE (8-10): excruciating pain, unable to do any normal activities, unable to walk     Rates pain a 7 at this time 4. WORK OR EXERCISE: "Has there been any recent work or exercise that involved this part of the body?"      Denies 5. CAUSE: "What do you think is causing the leg pain?"     Unknown, states he has a "dead hip" 6. OTHER SYMPTOMS: "Do you have any other symptoms?" (e.g., chest pain, back pain, breathing difficulty, swelling, rash, fever, numbness, weakness)     Generalized weakness, chronic difficulty breathing  Protocols used: Leg Pain-A-AH

## 2023-07-23 MED ORDER — TRAMADOL HCL 50 MG PO TABS
50.0000 mg | ORAL_TABLET | Freq: Four times a day (QID) | ORAL | 1 refills | Status: DC | PRN
  Filled 2023-07-23: qty 120, 30d supply, fill #0
  Filled 2023-08-22: qty 120, 30d supply, fill #1

## 2023-07-25 ENCOUNTER — Other Ambulatory Visit: Payer: Self-pay

## 2023-07-26 ENCOUNTER — Telehealth: Admitting: Family Medicine

## 2023-07-26 ENCOUNTER — Other Ambulatory Visit: Payer: Self-pay

## 2023-07-26 DIAGNOSIS — M87850 Other osteonecrosis, pelvis: Secondary | ICD-10-CM

## 2023-07-26 MED ORDER — METHOCARBAMOL 750 MG PO TABS
750.0000 mg | ORAL_TABLET | Freq: Three times a day (TID) | ORAL | 1 refills | Status: AC | PRN
Start: 2023-07-26 — End: ?
  Filled 2023-07-26: qty 90, 30d supply, fill #0
  Filled 2023-09-05: qty 90, 30d supply, fill #1

## 2023-07-26 NOTE — Progress Notes (Signed)
 Virtual Visit via Video Note  I connected with Joshua [name], on 07/26/2023 at 4:35 PM by video enabled telemedicine device and verified that I am speaking with the correct person using two identifiers.   Consent: I discussed the limitations, risks, security and privacy concerns of performing an evaluation and management service by telemedicine and the availability of in person appointments. I also discussed with the patient that there may be a patient responsible charge related to this service. The patient expressed understanding and agreed to proceed.   Location of Patient: Home  Location of Provider: Clinic   Persons participating in Telemedicine visit: [place] Cahue Dr. Alvis Lemmings    Discussed the use of AI scribe software for clinical note transcription with the patient, who gave verbal consent to proceed.  History of Present Illness The patient, with a history of diabetes, left hip avascular necrosis, lumbar radiculopathy, emphysema, stage II CKD and hypertension, presents with severe hip pain that has rendered him bedridden. He reports that the pain radiates down his leg, causing significant discomfort and limiting his mobility. He has been unable to leave his house for  sometime due to the severity of the pain. He has been using a cane for mobility and has requested a wheelchair from his PCP due to the worsening of his condition.  The patient reports that he has a "dead bone" in his hip, which he attributes to his diabetes. He had an x-ray done two weeks ago, ordered by Doctor Laural Benes, which revealed the severity of his hip condition. He was informed that he requires a hip replacement, but due to his medical conditions, including high blood pressure and diabetes, and his smoking habit, he was told that surgery would be a risk.  The patient is currently on tramadol for pain management, but reports that it is not providing sufficient relief. He also takes gabapentin, but it is  unclear if this is for pain management or another condition. He expresses a desire for additional pain management options.      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   Chronic pain    Diabetes mellitus without complication (HCC)    Generalized anxiety disorder    Headache    Hypertension    Schizoaffective disorder (HCC)    Allergies  Allergen Reactions   Statins Other (See Comments)    Myopathy   Haldol [Haloperidol] Other (See Comments)    Unk reaction   Metformin And Related Diarrhea and Nausea And Vomiting   Trazodone And Nefazodone Itching    Current Outpatient Medications on File Prior to Visit  Medication Sig Dispense Refill   Accu-Chek FastClix Lancets MISC Use to check blood sugars three times per day (Patient not taking: Reported on 04/08/2023) 102 each 11   albuterol (PROAIR HFA) 108 (90 Base) MCG/ACT inhaler Inhale 2 puffs into the lungs every 4 (four) hours as needed for wheezing or shortness of breath. 8.5 g 3   amLODipine (NORVASC) 5 MG tablet Take 1 tablet (5 mg total) by mouth daily. (Patient not taking: Reported on 04/08/2023) 90 tablet 2   blood glucose meter kit and supplies KIT Dispense based on patient and insurance preference. Use up to four times daily as directed. ICD-10  E11.65  Z79.4 (Patient not taking: Reported on 04/08/2023) 1 each 0   Blood Glucose Monitoring Suppl (ACCU-CHEK GUIDE ME) w/Device KIT Use to check blood sugars up to 3 times per day. ICD-10 E11.65, Z79.4 (Patient not taking: Reported on 04/08/2023) 1 kit 0   Blood Pressure Monitoring (BLOOD PRESSURE KIT) DEVI Use to measure blood pressure (Patient not taking: Reported on 04/08/2023) 1 each 0   cetirizine (ZYRTEC)  10 MG tablet Take 1 tablet (10 mg total) by mouth daily. (Patient not taking: Reported on 04/08/2023) 30 tablet 11   diclofenac (VOLTAREN) 50 MG EC tablet Take 1 tablet (50 mg total) by mouth 2 (two) times daily as needed for moderate pain. Eat before taking the medicine. 60 tablet 2   fluticasone-salmeterol (ADVAIR) 250-50 MCG/ACT AEPB Inhale 1 puff into the lungs 2 (two) times daily. 60 each 6   gabapentin (NEURONTIN) 300 MG capsule Take 2 capsules (600 mg total) by mouth 3 (three) times daily. 180 capsule 3   glucose blood (ACCU-CHEK GUIDE) test strip Use as instructed to check blood sugars three times per day. 100 each 11   hydrOXYzine (ATARAX) 50 MG tablet Take 1 tablet (50 mg total) by mouth 3 (three) times daily. (Patient not taking: Reported on 04/08/2023) 90 tablet 2   insulin glargine (LANTUS) 100 unit/mL SOPN Inject 38 Units into the skin at bedtime. 15 mL 4   insulin lispro (HUMALOG KWIKPEN) 100 UNIT/ML KwikPen Inject 10 Units into the skin 3 (three) times daily. 15 mL 0   Insulin Pen Needle (PEN NEEDLES) 32G X 4 MM MISC Use to inject insulin four times daily. Also, Ozempic once weekly. 100 each 6   ipratropium-albuterol (DUONEB) 0.5-2.5 (3) MG/3ML SOLN Take 3 mLs by nebulization every 6 (six) hours as needed (shortness of breath). 180 mL 1   Lancets Misc. (ACCU-CHEK FASTCLIX LANCET) KIT Use to check blood sugars 3 times per day 1 kit 11   omeprazole (PRILOSEC) 20 MG capsule Take 1 capsule (20 mg total) by mouth daily. 90 capsule 1   paliperidone (INVEGA) 6 MG 24 hr tablet Take 6 mg by mouth at bedtime. (Patient not taking: Reported on 04/08/2023)     Respiratory Therapy Supplies (FLUTTER) DEVI Use with 4 times daily (Patient not taking: Reported on 04/08/2023) 1 each 0   Semaglutide, 2 MG/DOSE, 8 MG/3ML SOPN Inject 2 mg as directed once a week. (Patient not taking: Reported on 04/08/2023) 9 mL 2   Spacer/Aero-Holding Chambers (AEROCHAMBER MV) inhaler Use as instructed (Patient not taking:  Reported on 04/08/2023) 1 each 0   SUMAtriptan (IMITREX) 50 MG tablet Take 1 tablet (50 mg total) by mouth every 2 (two) hours as needed for migraine. May repeat in 2 hours if headache persists or recurs. 10 tablet 1   traMADol (ULTRAM) 50 MG tablet Take 1 tablet (50 mg total) by mouth every 6 (six) hours as needed for moderate pain (pain score 4-6) or severe pain (pain score 7-10). 120 tablet 1   valsartan-hydrochlorothiazide (DIOVAN-HCT) 320-25 MG tablet Take 1 tablet by mouth daily. 90 tablet 3   No current facility-administered medications on file prior to visit.    ROS: See HPI  Observations/Objective: Awake, alert, oriented x3 Not in acute distress Normal mood      Latest Ref Rng & Units 04/08/2023    4:01 PM 08/03/2022    2:56 PM 05/03/2022    3:06 PM  CMP  Glucose 70 - 99 mg/dL 295  77  284  BUN 8 - 27 mg/dL 30  24  26    Creatinine 0.76 - 1.27 mg/dL 2.13  0.86  5.78   Sodium 134 - 144 mmol/L 138  134  133   Potassium 3.5 - 5.2 mmol/L 4.9  4.8  4.7   Chloride 96 - 106 mmol/L 98  97  94   CO2 20 - 29 mmol/L 23  22  22    Calcium 8.6 - 10.2 mg/dL 9.4  9.9  9.7   Total Protein 6.0 - 8.5 g/dL   6.5   Total Bilirubin 0.0 - 1.2 mg/dL   0.2   Alkaline Phos 44 - 121 IU/L   131   AST 0 - 40 IU/L   27   ALT 0 - 44 IU/L   45     Lipid Panel     Component Value Date/Time   CHOL 175 08/03/2022 1456   TRIG 171 (H) 08/03/2022 1456   HDL 50 08/03/2022 1456   CHOLHDL 3.5 08/03/2022 1456   CHOLHDL 3.3 09/30/2016 0651   VLDL 35 09/30/2016 0651   LDLCALC 96 08/03/2022 1456   LABVLDL 29 08/03/2022 1456    Lab Results  Component Value Date   HGBA1C 8.3 (A) 04/08/2023      Assessment & Plan Avascular Necrosis of the Hip Avascular necrosis of the hip, likely secondary to diabetes, causing severe pain and immobility. The condition has progressed to the point where he is bedridden and unable to walk without assistance. Reports severe pain radiating from the hip to the knee and  foot, and is unable to tolerate any pressure on the toes. Surgery is high risk due to comorbidities including hypertension, diabetes, and smoking history, with potential complications per message from orthopedic. Currently using a cane for ambulation and has requested a wheelchair due to difficulty walking. - Prescribe Robaxin to be taken in combination with tramadol and gabapentin for pain management - Advise to monitor for drowsiness as a side effect of Robaxin - Send prescription for Robaxin to the pharmacy   Follow-up Scheduled for a follow-up appointment with Doctor Laural Benes next month.       Meds ordered this encounter  Medications   methocarbamol (ROBAXIN-750) 750 MG tablet    Sig: Take 1 tablet (750 mg total) by mouth every 8 (eight) hours as needed for muscle spasms.    Dispense:  90 tablet    Refill:  1    Follow Up Instructions: Previously scheduled appointment   I discussed the assessment and treatment plan with the patient. The patient was provided an opportunity to ask questions and all were answered. The patient agreed with the plan and demonstrated an understanding of the instructions.   The patient was advised to call back or seek an in-person evaluation if the symptoms worsen or if the condition fails to improve as anticipated.     I provided 15 minutes total of Telehealth time during this encounter including median intraservice time, reviewing previous notes, investigations, ordering medications, medical decision making, coordinating care and patient verbalized understanding at the end of the visit.     Hoy Register, MD, FAAFP. [facility] [place], [place] [phone]   07/26/2023, 4:35 PM

## 2023-07-26 NOTE — Patient Instructions (Signed)
 VISIT SUMMARY:  During your visit, we discussed your severe hip pain, which has significantly impacted your mobility and daily life. We reviewed your recent x-ray results and the challenges posed by your diabetes, hypertension, and smoking habit in relation to potential hip replacement surgery. We also addressed your current pain management regimen and explored additional options to help alleviate your discomfort.  YOUR PLAN:  -AVASCULAR NECROSIS OF THE HIP: Avascular necrosis of the hip is a condition where the bone tissue in the hip dies due to a lack of blood supply, often linked to diabetes. This has caused severe pain and immobility for you. We have prescribed Robaxin to be taken along with your current medications, tramadol and gabapentin, to help manage your pain. Please monitor for drowsiness as a side effect of Robaxin. Your prescription for Robaxin has been sent to the pharmacy.  -DIABETES MELLITUS: Diabetes is a chronic condition that affects how your body processes blood sugar and can contribute to complications like avascular necrosis. It also increases the risks associated with surgical procedures.  -HYPERTENSION: Hypertension, or high blood pressure, is a condition that can complicate surgical options and needs to be managed carefully. We have reassured you that Robaxin will not affect your blood pressure.  -SMOKING: Smoking can worsen your overall health and increase the risks associated with surgery and the management of other conditions like diabetes and hypertension. Quitting smoking is highly recommended to improve your health outcomes.  INSTRUCTIONS:  Please follow up with Doctor Laural Benes next week as scheduled. Continue to take your prescribed medications and monitor for any side effects. If you experience any new or worsening symptoms, contact your healthcare provider immediately.

## 2023-07-29 ENCOUNTER — Other Ambulatory Visit: Payer: Self-pay

## 2023-07-29 ENCOUNTER — Other Ambulatory Visit: Payer: Self-pay | Admitting: Internal Medicine

## 2023-07-29 ENCOUNTER — Other Ambulatory Visit: Payer: Self-pay | Admitting: Critical Care Medicine

## 2023-07-29 DIAGNOSIS — G43009 Migraine without aura, not intractable, without status migrainosus: Secondary | ICD-10-CM

## 2023-07-29 DIAGNOSIS — G8929 Other chronic pain: Secondary | ICD-10-CM

## 2023-07-29 MED ORDER — DICLOFENAC SODIUM 50 MG PO TBEC
50.0000 mg | DELAYED_RELEASE_TABLET | Freq: Two times a day (BID) | ORAL | 2 refills | Status: DC | PRN
  Filled 2023-07-29: qty 60, 30d supply, fill #0
  Filled 2023-08-28: qty 60, 30d supply, fill #1
  Filled 2023-09-27: qty 60, 30d supply, fill #2

## 2023-07-29 MED ORDER — SUMATRIPTAN SUCCINATE 50 MG PO TABS
50.0000 mg | ORAL_TABLET | ORAL | 1 refills | Status: DC | PRN
  Filled 2023-07-29: qty 9, 30d supply, fill #0
  Filled 2023-09-26: qty 9, 30d supply, fill #1
  Filled 2023-10-31: qty 9, 30d supply, fill #2

## 2023-08-01 ENCOUNTER — Other Ambulatory Visit: Payer: Self-pay

## 2023-08-12 ENCOUNTER — Ambulatory Visit: Payer: Self-pay | Admitting: Internal Medicine

## 2023-08-15 ENCOUNTER — Other Ambulatory Visit: Payer: Self-pay | Admitting: Internal Medicine

## 2023-08-15 ENCOUNTER — Other Ambulatory Visit: Payer: Self-pay

## 2023-08-15 DIAGNOSIS — G629 Polyneuropathy, unspecified: Secondary | ICD-10-CM

## 2023-08-15 DIAGNOSIS — G8929 Other chronic pain: Secondary | ICD-10-CM

## 2023-08-17 ENCOUNTER — Other Ambulatory Visit: Payer: Self-pay

## 2023-08-17 MED ORDER — GABAPENTIN 300 MG PO CAPS
600.0000 mg | ORAL_CAPSULE | Freq: Three times a day (TID) | ORAL | 3 refills | Status: DC
  Filled 2023-08-17: qty 180, 30d supply, fill #0
  Filled 2023-09-12: qty 180, 30d supply, fill #1
  Filled 2023-10-16: qty 180, 30d supply, fill #2
  Filled 2023-11-16: qty 180, 30d supply, fill #3

## 2023-08-18 ENCOUNTER — Other Ambulatory Visit: Payer: Self-pay

## 2023-08-22 ENCOUNTER — Other Ambulatory Visit: Payer: Self-pay

## 2023-08-28 ENCOUNTER — Other Ambulatory Visit: Payer: Self-pay | Admitting: Internal Medicine

## 2023-08-28 DIAGNOSIS — E1165 Type 2 diabetes mellitus with hyperglycemia: Secondary | ICD-10-CM

## 2023-08-29 ENCOUNTER — Other Ambulatory Visit: Payer: Self-pay

## 2023-08-29 MED ORDER — INSULIN LISPRO (1 UNIT DIAL) 100 UNIT/ML (KWIKPEN)
10.0000 [IU] | PEN_INJECTOR | Freq: Three times a day (TID) | SUBCUTANEOUS | 0 refills | Status: DC
Start: 2023-08-29 — End: 2023-08-30
  Filled 2023-08-29: qty 15, 50d supply, fill #0

## 2023-08-30 ENCOUNTER — Ambulatory Visit: Attending: Internal Medicine | Admitting: Internal Medicine

## 2023-08-30 ENCOUNTER — Encounter: Payer: Self-pay | Admitting: Internal Medicine

## 2023-08-30 ENCOUNTER — Other Ambulatory Visit: Payer: Self-pay

## 2023-08-30 VITALS — BP 109/70 | HR 83 | Temp 98.1°F | Ht 69.0 in

## 2023-08-30 DIAGNOSIS — Z6837 Body mass index (BMI) 37.0-37.9, adult: Secondary | ICD-10-CM

## 2023-08-30 DIAGNOSIS — E669 Obesity, unspecified: Secondary | ICD-10-CM

## 2023-08-30 DIAGNOSIS — F17200 Nicotine dependence, unspecified, uncomplicated: Secondary | ICD-10-CM

## 2023-08-30 DIAGNOSIS — G8929 Other chronic pain: Secondary | ICD-10-CM

## 2023-08-30 DIAGNOSIS — R269 Unspecified abnormalities of gait and mobility: Secondary | ICD-10-CM

## 2023-08-30 DIAGNOSIS — M25562 Pain in left knee: Secondary | ICD-10-CM

## 2023-08-30 DIAGNOSIS — E119 Type 2 diabetes mellitus without complications: Secondary | ICD-10-CM | POA: Diagnosis not present

## 2023-08-30 DIAGNOSIS — M87052 Idiopathic aseptic necrosis of left femur: Secondary | ICD-10-CM

## 2023-08-30 DIAGNOSIS — J439 Emphysema, unspecified: Secondary | ICD-10-CM

## 2023-08-30 DIAGNOSIS — F17210 Nicotine dependence, cigarettes, uncomplicated: Secondary | ICD-10-CM

## 2023-08-30 DIAGNOSIS — Z7985 Long-term (current) use of injectable non-insulin antidiabetic drugs: Secondary | ICD-10-CM | POA: Diagnosis not present

## 2023-08-30 DIAGNOSIS — I10 Essential (primary) hypertension: Secondary | ICD-10-CM | POA: Diagnosis not present

## 2023-08-30 DIAGNOSIS — Z794 Long term (current) use of insulin: Secondary | ICD-10-CM

## 2023-08-30 DIAGNOSIS — F250 Schizoaffective disorder, bipolar type: Secondary | ICD-10-CM

## 2023-08-30 DIAGNOSIS — E1159 Type 2 diabetes mellitus with other circulatory complications: Secondary | ICD-10-CM

## 2023-08-30 DIAGNOSIS — G894 Chronic pain syndrome: Secondary | ICD-10-CM

## 2023-08-30 LAB — GLUCOSE, POCT (MANUAL RESULT ENTRY): POC Glucose: 84 mg/dL (ref 70–99)

## 2023-08-30 LAB — POCT GLYCOSYLATED HEMOGLOBIN (HGB A1C): HbA1c, POC (controlled diabetic range): 8 % — AB (ref 0.0–7.0)

## 2023-08-30 MED ORDER — IPRATROPIUM-ALBUTEROL 0.5-2.5 (3) MG/3ML IN SOLN
3.0000 mL | Freq: Four times a day (QID) | RESPIRATORY_TRACT | 1 refills | Status: AC | PRN
  Filled 2023-08-30: qty 180, 15d supply, fill #0

## 2023-08-30 MED ORDER — VALSARTAN-HYDROCHLOROTHIAZIDE 320-25 MG PO TABS
1.0000 | ORAL_TABLET | Freq: Every day | ORAL | 3 refills | Status: AC
  Filled 2023-08-30 – 2023-11-16 (×2): qty 90, 90d supply, fill #0
  Filled 2024-02-09: qty 90, 90d supply, fill #1
  Filled 2024-05-14: qty 90, 90d supply, fill #2

## 2023-08-30 MED ORDER — INSULIN LISPRO (1 UNIT DIAL) 100 UNIT/ML (KWIKPEN)
10.0000 [IU] | PEN_INJECTOR | Freq: Three times a day (TID) | SUBCUTANEOUS | 3 refills | Status: DC
  Filled 2023-10-18: qty 15, 50d supply, fill #0
  Filled 2023-12-08: qty 15, 50d supply, fill #1
  Filled 2024-01-24: qty 15, 50d supply, fill #2
  Filled 2024-03-14: qty 15, 50d supply, fill #3

## 2023-08-30 NOTE — Patient Instructions (Signed)
 VISIT SUMMARY:  Today, we discussed your severe left hip pain and mobility issues due to avascular necrosis, as well as your management of diabetes, COPD, and nicotine  dependence. We also reviewed your current medications and overall health maintenance.  YOUR PLAN:  -TYPE 2 DIABETES MELLITUS WITH HYPERGLYCEMIA: Type 2 diabetes is a condition where your body does not use insulin  properly, leading to high blood sugar levels. Your A1c has improved slightly to 8.0. Continue taking Ozempic , glargine insulin  (38 units), and Humalog  (10 units with meals). Please start checking your blood sugar twice daily, before breakfast and dinner, and adjust your Humalog  dose based on meal size. Follow up in 6 weeks with your blood sugar readings.  -CHRONIC OBSTRUCTIVE PULMONARY DISEASE (COPD): COPD is a chronic lung condition that makes it hard to breathe. Continue using your Advair  inhaler twice daily and refill your nebulizer solution as needed. Your oxygen levels are stable, but wheezing was noted.  -NICOTINE  DEPENDENCE, CIGARETTES: Nicotine  dependence means you are addicted to smoking cigarettes. You are currently smoking about a pack a day and are not ready to quit. We discussed lung cancer screening, but you chose to defer it for now.  -AVASCULAR NECROSIS OF LEFT HIP: Avascular necrosis is a condition where poor blood supply to the bones leads to bone death and collapse. This is causing significant pain and mobility issues in your left hip. You will be referred to [facility] for evaluation and a possible knee injection for pain relief. A lightweight wheelchair will be prescribed, and you should continue taking tramadol  for pain management.  -SCHIZOAFFECTIVE DISORDER: Schizoaffective disorder is a mental health condition that includes symptoms of both schizophrenia and mood disorders. Your condition is well-managed with your current medications, and you are being followed by [facility] behavioral  health.  INSTRUCTIONS:  Please follow up in 6 weeks with your blood sugar readings. You will also be referred to [facility] for evaluation and a possible knee injection for pain relief.

## 2023-08-30 NOTE — Progress Notes (Signed)
 Patient ID: Joshua [name], male    DOB: 1956/04/16  MRN: [id number]  CC: Diabetes (DM & HTN f/u. Med refill. Joshua [name] lightweight wheelchair due to mobility issues)   Subjective: Joshua [name] is a 68 y.o. male who presents for chronic ds management. His concerns today include:  Patient with history of DM type II, HL with statin myopathy, HTN, PAD, tobacco dependence, COPD spinal stenosis lumbar spine, migraines, GAD/schizoaffective bipolar type   Discussed the use of AI scribe software for clinical note transcription with the patient, who gave verbal consent to proceed.  History of Present Illness Joshua [name] "Joshua [name]" is a 68 year old male with avascular necrosis of the left hip who presents with severe left hip pain and mobility issues.  I had received a message from a PA April Bayard back in March concerning the patient.  He was seen by them and was diagnosed with AVN with collapse of the left hip and assessed to need total hip replacement.  Patient was deemed to be at high risk due to his comorbidities.  She had requested that if patient is wanting surgery, we do cardiac and pulmonary clearance ahead of time. Patient tells me today that he is not interested in doing any surgery.  However he reports that the pain in the left hip is still severe that he does greatly decreased his mobility.  He can only ambulate a few feet in his house at a time.  He is afraid of falling.  He would like to get a lightweight wheelchair.  He feels this would greatly assist him in carrying out his ADLs within his house. On tramadol  4 times a day for pain.  He finds this to be helpful.  Tob Dep/COPD:  He has a significant smoking history, currently smoking about a pack a day.  Not interested in lung cancer screening with CT scan. He uses Advair  twice daily for COPD and occasionally uses a nebulizer.  Reports no recent flare or increased cough.    DM:  Results for orders placed or performed in visit on  08/30/23  POCT glucose (manual entry)   Collection Time: 08/30/23  3:07 PM  Result Value Ref Range   POC Glucose 84 70 - 99 mg/dl  POCT glycosylated hemoglobin (Hb A1C)   Collection Time: 08/30/23  3:11 PM  Result Value Ref Range   Hemoglobin A1C     HbA1c POC (<> result, manual entry)     HbA1c, POC (prediabetic range)     HbA1c, POC (controlled diabetic range) 8.0 (A) 0.0 - 7.0 %  He manages diabetes with Ozempic  2mg  , glargine insulin  38 units daily, and Humalog  1 units with meals.  He tells me that he uses Humalog  only once a day because he has only 1 good meal a day.  He tries to eat smaller portions and attempt to lose weight in addition to taking the Ozempic .  He checks blood sugar infrequently and has stopped using his continuous glucose monitor.  Did not like using the CGM stating that it left to big a hole in his skin and he did not think it was accurate  He experiences left knee pain, which he attributes to his hip condition. He takes tramadol  four times a day and uses muscle relaxers for pain management. He is not interested in surgery at this time.  HTN: He is compliant with taking Diovan /HCTZ 320/25 mg daily.  Schizoaffective bipolar: Followed by [facility].  He feels he is  stable on his medications.    Patient Active Problem List   Diagnosis Date Noted   Narcotic use agreement exists 04/10/2023   Gait disturbance 04/10/2023   COPD with acute exacerbation (HCC) 08/03/2022   Statin myopathy 12/09/2021   Spinal stenosis of lumbar region 02/19/2020   Tobacco dependence 08/05/2019   Type 2 diabetes mellitus with hyperglycemia (HCC) 05/14/2019   Peripheral vascular disease due to secondary diabetes (HCC) 05/14/2019   Chronic low back pain with left-sided sciatica 05/14/2019   COPD with chronic bronchitis (HCC) 03/27/2019   Schizoaffective disorder (HCC) 07/24/2018   Chronic pain syndrome 10/14/2016   Osteoarthritis 10/14/2016   HTN (hypertension) 10/14/2016   Migraine  headache without aura 10/14/2016   Schizoaffective disorder, bipolar type (HCC) 05/10/2016   Generalized anxiety disorder 05/04/2016     Current Outpatient Medications on File Prior to Visit  Medication Sig Dispense Refill   Accu-Chek FastClix Lancets MISC Use to check blood sugars three times per day 102 each 11   albuterol  (PROAIR  HFA) 108 (90 Base) MCG/ACT inhaler Inhale 2 puffs into the lungs every 4 (four) hours as needed for wheezing or shortness of breath. 8.5 g 3   blood glucose meter kit and supplies KIT Dispense based on patient and insurance preference. Use up to four times daily as directed. ICD-10 E11.65  Z79.4 1 each 0   Blood Glucose Monitoring Suppl (ACCU-CHEK GUIDE ME) w/Device KIT Use to check blood sugars up to 3 times per day. ICD-10 E11.65, Z79.4 1 kit 0   Blood Pressure Monitoring (BLOOD PRESSURE KIT) DEVI Use to measure blood pressure 1 each 0   cetirizine  (ZYRTEC ) 10 MG tablet Take 1 tablet (10 mg total) by mouth daily. 30 tablet 11   diclofenac  (VOLTAREN ) 50 MG EC tablet Take 1 tablet (50 mg total) by mouth 2 (two) times daily as needed for moderate pain. Eat before taking the medicine. 60 tablet 2   fluticasone -salmeterol (ADVAIR ) 250-50 MCG/ACT AEPB Inhale 1 puff into the lungs 2 (two) times daily. 60 each 6   gabapentin  (NEURONTIN ) 300 MG capsule Take 2 capsules (600 mg total) by mouth 3 (three) times daily. 180 capsule 3   glucose blood (ACCU-CHEK GUIDE) test strip Use as instructed to check blood sugars three times per day. 100 each 11   hydrOXYzine  (ATARAX ) 50 MG tablet Take 1 tablet (50 mg total) by mouth 3 (three) times daily. 90 tablet 2   insulin  glargine (LANTUS ) 100 unit/mL SOPN Inject 38 Units into the skin at bedtime. 15 mL 4   insulin  lispro (HUMALOG  KWIKPEN) 100 UNIT/ML KwikPen Inject 10 Units into the skin 3 (three) times daily. 15 mL 0   Insulin  Pen Needle (PEN NEEDLES) 32G X 4 MM MISC Use to inject insulin  four times daily. Also, Ozempic  once weekly.  100 each 6   ipratropium-albuterol  (DUONEB) 0.5-2.5 (3) MG/3ML SOLN Take 3 mLs by nebulization every 6 (six) hours as needed (shortness of breath). 180 mL 1   Lancets Misc. (ACCU-CHEK FASTCLIX LANCET) KIT Use to check blood sugars 3 times per day 1 kit 11   methocarbamol  (ROBAXIN -750) 750 MG tablet Take 1 tablet (750 mg total) by mouth every 8 (eight) hours as needed for muscle spasms. 90 tablet 1   omeprazole  (PRILOSEC) 20 MG capsule Take 1 capsule (20 mg total) by mouth daily. 90 capsule 1   paliperidone  (INVEGA ) 6 MG 24 hr tablet Take 6 mg by mouth at bedtime.     Semaglutide , 2 MG/DOSE, 8 MG/3ML  SOPN Inject 2 mg as directed once a week. 9 mL 2   SUMAtriptan  (IMITREX ) 50 MG tablet Take 1 tablet (50 mg total) by mouth every 2 (two) hours as needed for migraine. May repeat in 2 hours if headache persists or recurs. 10 tablet 1   traMADol  (ULTRAM ) 50 MG tablet Take 1 tablet (50 mg total) by mouth every 6 (six) hours as needed for moderate pain (pain score 4-6) or severe pain (pain score 7-10). 120 tablet 1   valsartan -hydrochlorothiazide  (DIOVAN -HCT) 320-25 MG tablet Take 1 tablet by mouth daily. 90 tablet 3   amLODipine  (NORVASC ) 5 MG tablet Take 1 tablet (5 mg total) by mouth daily. (Patient not taking: Reported on 08/30/2023) 90 tablet 2   Respiratory Therapy Supplies (FLUTTER) DEVI Use with 4 times daily (Patient not taking: Reported on 04/08/2023) 1 each 0   Spacer/Aero-Holding Chambers (AEROCHAMBER MV) inhaler Use as instructed (Patient not taking: Reported on 04/08/2023) 1 each 0   No current facility-administered medications on file prior to visit.    Allergies  Allergen Reactions   Statins Other (See Comments)    Myopathy   Haldol [Haloperidol] Other (See Comments)    Unk reaction   Metformin  And Related Diarrhea and Nausea And Vomiting   Trazodone  And Nefazodone Itching    Social History   Socioeconomic History   Marital status: Married    Spouse name: Not on file   Number of  children: Not on file   Years of education: Not on file   Highest education level: 9th grade  Occupational History   Not on file  Tobacco Use   Smoking status: Every Day    Current packs/day: 1.00    Average packs/day: 1 pack/day for 50.0 years (50.0 ttl pk-yrs)    Types: Cigarettes   Smokeless tobacco: Never  Vaping Use   Vaping status: Never Used  Substance and Sexual Activity   Alcohol use: Not Currently   Drug use: Yes    Types: Marijuana    Comment: "have changed to hemp now" 09/17/17   Sexual activity: Yes  Other Topics Concern   Not on file  Social History Narrative   Not on file   Social Drivers of Health   Financial Resource Strain: Medium Risk (04/08/2023)   Overall Financial Resource Strain (CARDIA)    Difficulty of Paying Living Expenses: Somewhat hard  Food Insecurity: Food Insecurity Present (04/08/2023)   Hunger Vital Sign    Worried About Running Out of Food in the Last Year: Sometimes true    Ran Out of Food in the Last Year: Never true  Transportation Needs: No Transportation Needs (04/08/2023)   PRAPARE - Administrator, Civil Service (Medical): No    Lack of Transportation (Non-Medical): No  Physical Activity: Inactive (04/08/2023)   Exercise Vital Sign    Days of Exercise per Week: 0 days    Minutes of Exercise per Session: 0 min  Stress: No Stress Concern Present (04/08/2023)   [employer] of Occupational Health - Occupational Stress Questionnaire    Feeling of Stress : Not at all  Social Connections: Moderately Isolated (04/08/2023)   Social Connection and Isolation Panel [NHANES]    Frequency of Communication with Friends and Family: More than three times a week    Frequency of Social Gatherings with Friends and Family: More than three times a week    Attends Religious Services: Never    Database administrator or Organizations: No  Attends Club or Organization Meetings: Never    Marital Status: Married  Catering manager  Violence: Not At Risk (09/29/2022)   Humiliation, Afraid, Rape, and Kick questionnaire    Fear of Current or Ex-Partner: No    Emotionally Abused: No    Physically Abused: No    Sexually Abused: No    Family History  Problem Relation Age of Onset   Diabetes Mother    Cancer Mother        unsure what type   Heart disease Mother    Hypertension Mother    Dementia Father    Diabetes Brother    Dementia Maternal Grandfather    Cancer Paternal Grandmother 62       breast   Mental illness Neg Hx     Past Surgical History:  Procedure Laterality Date   APPENDECTOMY  1970    ROS: Review of Systems Negative except as stated above  PHYSICAL EXAM: BP 109/70 (BP Location: Left Arm, Patient Position: Sitting, Cuff Size: Large)   Pulse 83   Temp 98.1 F (36.7 C) (Oral)   Ht 5\' 9"  (1.753 m)   SpO2 (!) 89%   BMI 37.66 kg/m   Wt Readings from Last 3 Encounters:  04/08/23 255 lb (115.7 kg)  12/07/22 256 lb 9.6 oz (116.4 kg)  09/29/22 259 lb (117.5 kg)    Physical Exam  {male adult master:310786} {male adult master:310785}     Latest Ref Rng & Units 04/08/2023    4:01 PM 08/03/2022    2:56 PM 05/03/2022    3:06 PM  CMP  Glucose 70 - 99 mg/dL 952  77  841   BUN 8 - 27 mg/dL 30  24  26    Creatinine 0.76 - 1.27 mg/dL 3.24  4.01  0.27   Sodium 134 - 144 mmol/L 138  134  133   Potassium 3.5 - 5.2 mmol/L 4.9  4.8  4.7   Chloride 96 - 106 mmol/L 98  97  94   CO2 20 - 29 mmol/L 23  22  22    Calcium  8.6 - 10.2 mg/dL 9.4  9.9  9.7   Total Protein 6.0 - 8.5 g/dL   6.5   Total Bilirubin 0.0 - 1.2 mg/dL   0.2   Alkaline Phos 44 - 121 IU/L   131   AST 0 - 40 IU/L   27   ALT 0 - 44 IU/L   45    Lipid Panel     Component Value Date/Time   CHOL 175 08/03/2022 1456   TRIG 171 (H) 08/03/2022 1456   HDL 50 08/03/2022 1456   CHOLHDL 3.5 08/03/2022 1456   CHOLHDL 3.3 09/30/2016 0651   VLDL 35 09/30/2016 0651   LDLCALC 96 08/03/2022 1456    CBC    Component Value Date/Time    WBC 10.0 08/23/2021 0329   RBC 5.08 08/23/2021 0329   HGB 15.4 08/23/2021 0329   HGB 17.2 07/09/2021 1630   HCT 46.9 08/23/2021 0329   HCT 50.8 07/09/2021 1630   PLT 216 08/23/2021 0329   PLT 237 07/09/2021 1630   MCV 92.3 08/23/2021 0329   MCV 90 07/09/2021 1630   MCH 30.3 08/23/2021 0329   MCHC 32.8 08/23/2021 0329   RDW 14.1 08/23/2021 0329   RDW 13.6 07/09/2021 1630   LYMPHSABS 1.5 07/23/2021 1242   LYMPHSABS 2.7 07/09/2021 1630   MONOABS 0.5 07/23/2021 1242   EOSABS 0.0 07/23/2021 1242   EOSABS 0.1 07/09/2021  1630   BASOSABS 0.0 07/23/2021 1242   BASOSABS 0.1 07/09/2021 1630    ASSESSMENT AND PLAN:  Assessment and Plan Assessment & Plan Type 2 diabetes mellitus with hyperglycemia A1c improved from 8.3 to 8.0. Blood sugar 80 today. Current regimen includes Ozempic , glargine insulin , and Humalog . He eats once or twice daily, adjusting Humalog  accordingly. Not regularly checking blood sugars, discontinued CGM due to discomfort and inaccuracies. - Encouraged checking blood sugars twice daily, before breakfast and dinner. - Continue Ozempic , glargine insulin  38 units, and Humalog  10 units with meals. - Educated on adjusting Humalog  dose based on meal size. - Follow up in 6 weeks with blood sugar readings.  Chronic obstructive pulmonary disease (COPD) Uses Advair  inhaler twice daily, nebulizer less frequently. No increased cough or dyspnea. Oxygen saturation 93-94% on room air, wheezing noted. - Continue Advair  inhaler twice daily. - Refill nebulizer solution.  Nicotine  dependence, cigarettes Smokes about a pack daily, not ready to quit. Smoking helps with pain and provides activity. - Discussed lung cancer screening with CT scan, he deferred.  Avascular necrosis of left hip Avascular necrosis with collapse of left hip. Advised against surgery due to high risk. Significant pain and mobility issues, uses lightweight wheelchair. Open to knee injection for pain relief. -  Refer to [facility] for evaluation and possible knee injection. - Prescribe lightweight wheelchair. - Continue tramadol  for pain management.  Schizoaffective disorder Well-managed on current medications, followed by [facility] behavioral health.  Goals of Care Prefers to avoid surgery due to high risk of complications and potential mortality, opting for conservative management.     1. Type 2 diabetes mellitus with hyperglycemia, with long-term current use of insulin  (HCC) (Primary) *** - POCT glycosylated hemoglobin (Hb A1C) - POCT glucose (manual entry)  2. Chronic pain syndrome ***  3. Low back pain with radiation ***    Patient was given the opportunity to ask questions.  Patient verbalized understanding of the plan and was able to repeat key elements of the plan.   This documentation was completed using Paediatric nurse.  Any transcriptional errors are unintentional.  Orders Placed This Encounter  Procedures   POCT glycosylated hemoglobin (Hb A1C)   POCT glucose (manual entry)     Requested Prescriptions   Pending Prescriptions Disp Refills   insulin  lispro (HUMALOG  KWIKPEN) 100 UNIT/ML KwikPen 15 mL 0    Sig: Inject 10 Units into the skin 3 (three) times daily.   traMADol  (ULTRAM ) 50 MG tablet 120 tablet 1    Sig: Take 1 tablet (50 mg total) by mouth every 6 (six) hours as needed for moderate pain (pain score 4-6) or severe pain (pain score 7-10).   valsartan -hydrochlorothiazide  (DIOVAN -HCT) 320-25 MG tablet 90 tablet 3    Sig: Take 1 tablet by mouth daily.    No follow-ups on file.  Concetta Dee, MD, FACP

## 2023-08-31 ENCOUNTER — Encounter: Payer: Self-pay | Admitting: Internal Medicine

## 2023-09-01 ENCOUNTER — Other Ambulatory Visit: Payer: Self-pay

## 2023-09-06 ENCOUNTER — Other Ambulatory Visit: Payer: Self-pay

## 2023-09-07 ENCOUNTER — Telehealth: Payer: Self-pay | Admitting: Internal Medicine

## 2023-09-07 DIAGNOSIS — G8929 Other chronic pain: Secondary | ICD-10-CM

## 2023-09-07 DIAGNOSIS — M87052 Idiopathic aseptic necrosis of left femur: Secondary | ICD-10-CM

## 2023-09-07 NOTE — Telephone Encounter (Signed)
 Called but no answer. LVM to call back.

## 2023-09-07 NOTE — Telephone Encounter (Signed)
 Patient called back provided note as written by provider. Patient understood. Thank You

## 2023-09-07 NOTE — Telephone Encounter (Signed)
 Noted! Thank you

## 2023-09-07 NOTE — Telephone Encounter (Signed)
 Let patient know that I have not received an answer back from the physician assistant whom he saw in March who diagnosed him with avascular necrosis of the left hip. I have also not received x-ray report that was done at that time.  For this reason, I would like to order xray of the LT hip and knee for our records.  Order placed for [facility] on [address].

## 2023-09-12 ENCOUNTER — Other Ambulatory Visit: Payer: Self-pay

## 2023-09-13 ENCOUNTER — Other Ambulatory Visit: Payer: Self-pay

## 2023-09-15 ENCOUNTER — Other Ambulatory Visit: Payer: Self-pay

## 2023-09-19 ENCOUNTER — Other Ambulatory Visit: Payer: Self-pay | Admitting: Internal Medicine

## 2023-09-19 DIAGNOSIS — G894 Chronic pain syndrome: Secondary | ICD-10-CM

## 2023-09-19 DIAGNOSIS — M545 Low back pain, unspecified: Secondary | ICD-10-CM

## 2023-09-20 MED ORDER — TRAMADOL HCL 50 MG PO TABS
50.0000 mg | ORAL_TABLET | Freq: Four times a day (QID) | ORAL | 2 refills | Status: DC | PRN
  Filled 2023-09-20: qty 120, 30d supply, fill #0
  Filled 2023-10-18 – 2023-10-20 (×2): qty 120, 30d supply, fill #1
  Filled 2023-11-16 (×2): qty 120, 30d supply, fill #2

## 2023-09-21 ENCOUNTER — Other Ambulatory Visit: Payer: Self-pay

## 2023-09-22 ENCOUNTER — Other Ambulatory Visit: Payer: Self-pay

## 2023-09-29 ENCOUNTER — Other Ambulatory Visit: Payer: Self-pay | Admitting: Critical Care Medicine

## 2023-09-29 DIAGNOSIS — E1165 Type 2 diabetes mellitus with hyperglycemia: Secondary | ICD-10-CM

## 2023-09-30 ENCOUNTER — Ambulatory Visit: Attending: Internal Medicine

## 2023-09-30 ENCOUNTER — Other Ambulatory Visit: Payer: Self-pay

## 2023-09-30 DIAGNOSIS — Z0000 Encounter for general adult medical examination without abnormal findings: Secondary | ICD-10-CM

## 2023-09-30 DIAGNOSIS — Z1211 Encounter for screening for malignant neoplasm of colon: Secondary | ICD-10-CM | POA: Diagnosis not present

## 2023-09-30 MED ORDER — OZEMPIC (2 MG/DOSE) 8 MG/3ML ~~LOC~~ SOPN
2.0000 mg | PEN_INJECTOR | SUBCUTANEOUS | 2 refills | Status: AC
  Filled 2023-09-30: qty 9, 84d supply, fill #0
  Filled 2023-12-23: qty 9, 84d supply, fill #1
  Filled 2024-03-16: qty 9, 84d supply, fill #2

## 2023-09-30 NOTE — Progress Notes (Signed)
 Subjective:   Joshua [name] is a 68 y.o. male who presents for Medicare Annual/Subsequent preventive examination.  Visit Complete: Virtual I connected with  Joshua [name] on 09/30/23 by a audio enabled telemedicine application and verified that I am speaking with the correct person using two identifiers.  Patient Location: Home  Provider Location: Office/Clinic  I discussed the limitations of evaluation and management by telemedicine. The patient expressed understanding and agreed to proceed.  Vital Signs: Because this visit was a virtual/telehealth visit, some criteria may be missing or patient reported. Any vitals not documented were not able to be obtained and vitals that have been documented are patient reported.  Patient Medicare AWV questionnaire was completed by the patient on 09/29/2023; I have confirmed that all information answered by patient is correct and no changes since this date.  Cardiac Risk Factors include: diabetes mellitus;advanced age (>58men, >51 women);hypertension;male gender;smoking/ tobacco exposure     Objective:    Today's Vitals   09/30/23 1348  PainSc: 5    There is no height or weight on file to calculate BMI.     09/30/2023    1:52 PM 09/29/2022   12:00 PM 10/01/2021    3:56 PM 08/23/2021    3:26 AM 07/13/2021    1:56 PM 11/04/2020    7:45 PM 03/06/2020   10:52 AM  Advanced Directives  Does Patient Have a Medical Advance Directive? No No No No No No No  Would patient like information on creating a medical advance directive? No - Patient declined Yes (MAU/Ambulatory/Procedural Areas - Information given)   No - Patient declined No - Patient declined     Current Medications (verified) Outpatient Encounter Medications as of 09/30/2023  Medication Sig   Accu-Chek FastClix Lancets MISC Use to check blood sugars three times per day   albuterol  (PROAIR  HFA) 108 (90 Base) MCG/ACT inhaler Inhale 2 puffs into the lungs every 4 (four) hours as needed for  wheezing or shortness of breath.   blood glucose meter kit and supplies KIT Dispense based on patient and insurance preference. Use up to four times daily as directed. ICD-10 E11.65  Z79.4   Blood Glucose Monitoring Suppl (ACCU-CHEK GUIDE ME) w/Device KIT Use to check blood sugars up to 3 times per day. ICD-10 E11.65, Z79.4   Blood Pressure Monitoring (BLOOD PRESSURE KIT) DEVI Use to measure blood pressure   cetirizine  (ZYRTEC ) 10 MG tablet Take 1 tablet (10 mg total) by mouth daily.   diclofenac  (VOLTAREN ) 50 MG EC tablet Take 1 tablet (50 mg total) by mouth 2 (two) times daily as needed for moderate pain. Eat before taking the medicine.   fluticasone -salmeterol (ADVAIR ) 250-50 MCG/ACT AEPB Inhale 1 puff into the lungs 2 (two) times daily.   gabapentin  (NEURONTIN ) 300 MG capsule Take 2 capsules (600 mg total) by mouth 3 (three) times daily.   glucose blood (ACCU-CHEK GUIDE) test strip Use as instructed to check blood sugars three times per day.   hydrOXYzine  (ATARAX ) 50 MG tablet Take 1 tablet (50 mg total) by mouth 3 (three) times daily.   insulin  glargine (LANTUS ) 100 unit/mL SOPN Inject 38 Units into the skin at bedtime.   insulin  lispro (HUMALOG  KWIKPEN) 100 UNIT/ML KwikPen Inject 10 Units into the skin 3 (three) times daily.   Insulin  Pen Needle (PEN NEEDLES) 32G X 4 MM MISC Use to inject insulin  four times daily. Also, Ozempic  once weekly.   ipratropium-albuterol  (DUONEB) 0.5-2.5 (3) MG/3ML SOLN Take 3 mLs by nebulization  every 6 (six) hours as needed (shortness of breath).   Lancets Misc. (ACCU-CHEK FASTCLIX LANCET) KIT Use to check blood sugars 3 times per day   methocarbamol  (ROBAXIN -750) 750 MG tablet Take 1 tablet (750 mg total) by mouth every 8 (eight) hours as needed for muscle spasms.   omeprazole  (PRILOSEC) 20 MG capsule Take 1 capsule (20 mg total) by mouth daily.   paliperidone  (INVEGA ) 6 MG 24 hr tablet Take 6 mg by mouth at bedtime.   Respiratory Therapy Supplies (FLUTTER) DEVI  Use with 4 times daily (Patient not taking: Reported on 04/08/2023)   Semaglutide , 2 MG/DOSE, (OZEMPIC , 2 MG/DOSE,) 8 MG/3ML SOPN Inject 2 mg as directed once a week.   Spacer/Aero-Holding Chambers (AEROCHAMBER MV) inhaler Use as instructed (Patient not taking: Reported on 04/08/2023)   SUMAtriptan  (IMITREX ) 50 MG tablet Take 1 tablet (50 mg total) by mouth every 2 (two) hours as needed for migraine. May repeat in 2 hours if headache persists or recurs.   traMADol  (ULTRAM ) 50 MG tablet Take 1 tablet (50 mg total) by mouth every 6 (six) hours as needed for moderate pain (pain score 4-6) or severe pain (pain score 7-10).   valsartan -hydrochlorothiazide  (DIOVAN -HCT) 320-25 MG tablet Take 1 tablet by mouth daily.   [DISCONTINUED] Semaglutide , 2 MG/DOSE, 8 MG/3ML SOPN Inject 2 mg as directed once a week.   No facility-administered encounter medications on file as of 09/30/2023.    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   Chronic pain    Diabetes mellitus without complication (HCC)    Generalized anxiety disorder    Headache    Hypertension    Schizoaffective disorder (HCC)    Past Surgical History:  Procedure Laterality Date   APPENDECTOMY  1970   Family History  Problem Relation Age of Onset   Diabetes Mother    Cancer Mother        unsure what type   Heart disease Mother    Hypertension Mother    Dementia Father    Diabetes Brother    Dementia Maternal Grandfather    Cancer Paternal Grandmother 12       breast   Mental illness Neg Hx    Social History   Socioeconomic History   Marital  status: Married    Spouse name: Not on file   Number of children: Not on file   Years of education: Not on file   Highest education level: 9th grade  Occupational History   Not on file  Tobacco Use   Smoking status: Every Day    Current packs/day: 1.00    Average packs/day: 1 pack/day for 50.0 years (50.0 ttl pk-yrs)    Types: Cigarettes   Smokeless tobacco: Never  Vaping Use   Vaping status: Never Used  Substance and Sexual Activity   Alcohol use: Not Currently   Drug use: Yes    Types: Marijuana    Comment: "have changed to hemp now" 09/17/17   Sexual activity: Yes  Other Topics Concern   Not on file  Social History Narrative   Not on file   Social Drivers of Health   Financial Resource Strain:  Medium Risk (04/08/2023)   Overall Financial Resource Strain (CARDIA)    Difficulty of Paying Living Expenses: Somewhat hard  Food Insecurity: Food Insecurity Present (04/08/2023)   Hunger Vital Sign    Worried About Running Out of Food in the Last Year: Sometimes true    Ran Out of Food in the Last Year: Never true  Transportation Needs: No Transportation Needs (04/08/2023)   PRAPARE - Administrator, Civil Service (Medical): No    Lack of Transportation (Non-Medical): No  Physical Activity: Inactive (04/08/2023)   Exercise Vital Sign    Days of Exercise per Week: 0 days    Minutes of Exercise per Session: 0 min  Stress: No Stress Concern Present (04/08/2023)   [employer] of Occupational Health - Occupational Stress Questionnaire    Feeling of Stress : Not at all  Social Connections: Moderately Isolated (04/08/2023)   Social Connection and Isolation Panel [NHANES]    Frequency of Communication with Friends and Family: More than three times a week    Frequency of Social Gatherings with Friends and Family: More than three times a week    Attends Religious Services: Never    Database administrator or Organizations: No    Attends Hospital doctor: Not on file    Marital Status: Married    Tobacco Counseling Not ready to quit  Clinical Intake:  Pre-visit preparation completed: Yes  Pain : 0-10 Pain Score: 5  Pain Type: Chronic pain Pain Location: Knee Pain Descriptors / Indicators: Aching Pain Onset: In the past 7 days Pain Frequency: Constant     Nutritional Risks: None Diabetes: Yes CBG done?: No Did pt. bring in CBG monitor from home?: No  How often do you need to have someone help you when you read instructions, pamphlets, or other written materials from your doctor or pharmacy?: 1 - Never  Interpreter Needed?: No      Activities of Daily Living    09/30/2023    1:50 PM 09/29/2023    1:06 PM  In your present state of health, do you have any difficulty performing the following activities:  Hearing? 0 0  Vision? 0 0  Difficulty concentrating or making decisions? 0 0  Walking or climbing stairs? 1 1  Dressing or bathing? 1 1  Doing errands, shopping? 1 1  Preparing Food and eating ? Y Y  Using the Toilet? N N  In the past six months, have you accidently leaked urine? Y Y  Do you have problems with loss of bowel control? N N  Managing your Medications? N N  Managing your Finances? N N  Housekeeping or managing your Housekeeping? Colie Dawes    Patient Care Team: Lawrance Presume, MD as PCP - General (Internal Medicine)  Indicate any recent Medical Services you may have received from other than [facility] providers in the past year (date may be approximate).     Assessment:   This is a routine wellness examination for Devontae.  Hearing/Vision screen No results found.   Goals Addressed   None   Depression Screen    09/30/2023    1:53 PM 08/30/2023    3:09 PM 09/29/2022   12:01 PM 08/03/2022    2:25 PM 05/03/2022    2:41 PM 02/11/2022   10:41 AM 11/02/2021    2:42 PM  PHQ 2/9 Scores  PHQ - 2 Score 0 0 0 0 0 2 0  PHQ- 9 Score  1 2 1  5     Fall Risk    09/29/2023    1:06 PM 12/07/2022    1:42 PM  09/29/2022   11:59 AM 08/03/2022    2:25 PM 05/03/2022    2:41 PM  Fall Risk   Falls in the past year? 1 0 0 0 0  Number falls in past yr: 0 0 0 0 0  Injury with Fall? 1 0 0 0 0  Risk for fall due to : No Fall Risks No Fall Risks Impaired balance/gait;Impaired mobility No Fall Risks No Fall Risks  Follow up Falls evaluation completed  Falls prevention discussed;Education provided;Falls evaluation completed      MEDICARE RISK AT HOME: Medicare Risk at Home Any stairs in or around the home?: (Patient-Rptd) Yes If so, are there any without handrails?: (Patient-Rptd) Yes Home free of loose throw rugs in walkways, pet beds, electrical cords, etc?: (Patient-Rptd) Yes Adequate lighting in your home to reduce risk of falls?: (Patient-Rptd) Yes Life alert?: (Patient-Rptd) No Use of a cane, walker or w/c?: (Patient-Rptd) Yes Grab bars in the bathroom?: (Patient-Rptd) No Shower chair or bench in shower?: (Patient-Rptd) Yes Elevated toilet seat or a handicapped toilet?: (Patient-Rptd) No  TIMED UP AND GO:  Was the test performed?  No    Cognitive Function:        09/30/2023    1:53 PM 09/29/2022   12:00 PM 10/01/2021    3:56 PM  6CIT Screen  What Year? 0 points 0 points 0 points  What month? 0 points 0 points 0 points  What time? 0 points 0 points 0 points  Count back from 20 0 points 0 points 0 points  Months in reverse 0 points 0 points 0 points  Repeat phrase 6 points 0 points 0 points  Total Score 6 points 0 points 0 points    Immunizations Immunization History  Administered Date(s) Administered   Influenza,inj,Quad PF,6+ Mos 02/14/2017, 02/23/2021   Moderna SARS-COV2 Booster Vaccination 06/18/2020   Moderna Sars-Covid-2 Vaccination 11/21/2019, 12/19/2019   PNEUMOCOCCAL CONJUGATE-20 04/28/2021   Pneumococcal Polysaccharide-23 07/15/2020   Tdap 10/14/2017    TDAP status: Up to date  Flu Vaccine status: Declined, Education has been provided regarding the importance of this  vaccine but patient still declined. Advised may receive this vaccine at local pharmacy or Health Dept. Aware to provide a copy of the vaccination record if obtained from local pharmacy or Health Dept. Verbalized acceptance and understanding.  Pneumonia vaccine status: up to date  Covid-19 vaccine status: Completed vaccines  Qualifies for Shingles Vaccine? Yes   Zostavax completed No   Shingrix Completed?: No.    Education has been provided regarding the importance of this vaccine. Patient has been advised to call insurance company to determine out of pocket expense if they have not yet received this vaccine. Advised may also receive vaccine at local pharmacy or Health Dept. Verbalized acceptance and understanding.  Screening Tests Health Maintenance  Topic Date Due   Fecal DNA (Cologuard)  Never done   OPHTHALMOLOGY EXAM  09/04/2021   Diabetic kidney evaluation - Urine ACR  08/03/2023   Lung Cancer Screening  09/29/2024 (Originally 02/23/2008)   FOOT EXAM  12/07/2023   HEMOGLOBIN A1C  03/01/2024   Diabetic kidney evaluation - eGFR measurement  04/07/2024   Medicare Annual Wellness (AWV)  09/29/2024   DTaP/Tdap/Td (2 - Td or Tdap) 10/15/2027   Pneumonia Vaccine 94+ Years old  Completed   HPV VACCINES  Aged Out   Meningococcal B  Vaccine  Aged Out   INFLUENZA VACCINE  Discontinued   COVID-19 Vaccine  Discontinued   Hepatitis C Screening  Discontinued   HIV Screening  Discontinued   Zoster Vaccines- Shingrix  Discontinued    Health Maintenance  Health Maintenance Due  Topic Date Due   Fecal DNA (Cologuard)  Never done   OPHTHALMOLOGY EXAM  09/04/2021   Diabetic kidney evaluation - Urine ACR  08/03/2023    Colorectal cancer screening: Referral to GI placed Cologuard order placed. Pt aware the office will call re: appt.  Lung Cancer Screening: (Low Dose CT Chest recommended if Age 1-80 years, 20 pack-year currently smoking OR have quit w/in 15years.) does qualify. Patient  Declined  Lung Cancer Screening Referral: Patient Declined  Additional Screening:  Hepatitis C Screening: does qualify; Not Completed  Vision Screening: Recommended annual ophthalmology exams for early detection of glaucoma and other disorders of the eye. Is the patient up to date with their annual eye exam?  No  Who is the provider or what is the name of the office in which the patient attends annual eye exams? Patient states that he will find his own If pt is not established with a provider, would they like to be referred to a provider to establish care? No .   Dental Screening: Recommended annual dental exams for proper oral hygiene  Diabetic Foot Exam: Diabetic Foot Exam: Completed 12/07/2022  Community Resource Referral / Chronic Care Management: CRR required this visit?  No   CCM required this visit?  No     Plan:     I have personally reviewed and noted the following in the patient's chart:   Medical and social history Use of alcohol, tobacco or illicit drugs  Current medications and supplements including opioid prescriptions. Patient is not currently taking opioid prescriptions. Functional ability and status Nutritional status Physical activity Advanced directives List of other physicians Hospitalizations, surgeries, and ER visits in previous 12 months Vitals Screenings to include cognitive, depression, and falls Referrals and appointments  In addition, I have reviewed and discussed with patient certain preventive protocols, quality metrics, and best practice recommendations. A written personalized care plan for preventive services as well as general preventive health recommendations were provided to patient.     Traven Davids V Fredi Hurtado, CMA   09/30/2023   After Visit Summary: (Mail) Due to this being a telephonic visit, the after visit summary with patients personalized plan was offered to patient via mail   Nurse Notes: Start time 1:48, End time:1:58

## 2023-09-30 NOTE — Patient Instructions (Signed)
 Health Maintenance, Male  Adopting a healthy lifestyle and getting preventive care are important in promoting health and wellness. Ask your health care provider about:  The right schedule for you to have regular tests and exams.  Things you can do on your own to prevent diseases and keep yourself healthy.  What should I know about diet, weight, and exercise?  Eat a healthy diet    Eat a diet that includes plenty of vegetables, fruits, low-fat dairy products, and lean protein.  Do not eat a lot of foods that are high in solid fats, added sugars, or sodium.  Maintain a healthy weight  Body mass index (BMI) is a measurement that can be used to identify possible weight problems. It estimates body fat based on height and weight. Your health care provider can help determine your BMI and help you achieve or maintain a healthy weight.  Get regular exercise  Get regular exercise. This is one of the most important things you can do for your health. Most adults should:  Exercise for at least 150 minutes each week. The exercise should increase your heart rate and make you sweat (moderate-intensity exercise).  Do strengthening exercises at least twice a week. This is in addition to the moderate-intensity exercise.  Spend less time sitting. Even light physical activity can be beneficial.  Watch cholesterol and blood lipids  Have your blood tested for lipids and cholesterol at 66 years of age, then have this test every 5 years.  You may need to have your cholesterol levels checked more often if:  Your lipid or cholesterol levels are high.  You are older than 66 years of age.  You are at high risk for heart disease.  What should I know about cancer screening?  Many types of cancers can be detected early and may often be prevented. Depending on your health history and family history, you may need to have cancer screening at various ages. This may include screening for:  Colorectal cancer.  Prostate cancer.  Skin cancer.  Lung  cancer.  What should I know about heart disease, diabetes, and high blood pressure?  Blood pressure and heart disease  High blood pressure causes heart disease and increases the risk of stroke. This is more likely to develop in people who have high blood pressure readings or are overweight.  Talk with your health care provider about your target blood pressure readings.  Have your blood pressure checked:  Every 3-5 years if you are 66-95 years of age.  Every year if you are 66 years old or older.  If you are between the ages of 29 and 29 and are a current or former smoker, ask your health care provider if you should have a one-time screening for abdominal aortic aneurysm (AAA).  Diabetes  Have regular diabetes screenings. This checks your fasting blood sugar level. Have the screening done:  Once every three years after age 23 if you are at a normal weight and have a low risk for diabetes.  More often and at a younger age if you are overweight or have a high risk for diabetes.  What should I know about preventing infection?  Hepatitis B  If you have a higher risk for hepatitis B, you should be screened for this virus. Talk with your health care provider to find out if you are at risk for hepatitis B infection.  Hepatitis C  Blood testing is recommended for:  Everyone born from [birth year] through 1963.  Anyone  with known risk factors for hepatitis C.  Sexually transmitted infections (STIs)  You should be screened each year for STIs, including gonorrhea and chlamydia, if:  You are sexually active and are younger than 66 years of age.  You are older than 66 years of age and your health care provider tells you that you are at risk for this type of infection.  Your sexual activity has changed since you were last screened, and you are at increased risk for chlamydia or gonorrhea. Ask your health care provider if you are at risk.  Ask your health care provider about whether you are at high risk for HIV. Your health care provider  may recommend a prescription medicine to help prevent HIV infection. If you choose to take medicine to prevent HIV, you should first get tested for HIV. You should then be tested every 3 months for as long as you are taking the medicine.  Follow these instructions at home:  Alcohol use  Do not drink alcohol if your health care provider tells you not to drink.  If you drink alcohol:  Limit how much you have to 0-2 drinks a day.  Know how much alcohol is in your drink. In the [place] one drink equals one 12 oz bottle of beer (355 mL), one 5 oz glass of wine (148 mL), or one 1 oz glass of hard liquor (44 mL).  Lifestyle  Do not use any products that contain nicotine or tobacco. These products include cigarettes, chewing tobacco, and vaping devices, such as e-cigarettes. If you need help quitting, ask your health care provider.  Do not use street drugs.  Do not share needles.  Ask your health care provider for help if you need support or information about quitting drugs.  General instructions  Schedule regular health, dental, and eye exams.  Stay current with your vaccines.  Tell your health care provider if:  You often feel depressed.  You have ever been abused or do not feel safe at home.  Summary  Adopting a healthy lifestyle and getting preventive care are important in promoting health and wellness.  Follow your health care provider's instructions about healthy diet, exercising, and getting tested or screened for diseases.  Follow your health care provider's instructions on monitoring your cholesterol and blood pressure.  This information is not intended to replace advice given to you by your health care provider. Make sure you discuss any questions you have with your health care provider.  Document Revised: 09/01/2020 Document Reviewed: 09/01/2020  Elsevier Patient Education  2024 [employer].

## 2023-10-02 NOTE — Progress Notes (Incomplete)
 S:    PCP: Dr. Lincoln Renshaw   No chief complaint on file.  Patient in good spirits.  Presents for diabetes evaluation, education, and management. Patient was last seen by Primary Care Provider 08/30/2023. A1c was 8.0% at that visit.   Today, pt reports diabetes is longstanding. He is compliant with weekly Ozempic  and his insulin . He only deviates from his rx'd Humalog  dose if he skips a meal. *** . He denies any hypoglycemia and is not currently experiencing any hyperglycemia-associated symptoms. He is tolerating the Ozempic  well and denies any NV, abdominal pain, or changes in vision.   Family/Social History:  -Current smoker -Denies any alcohol use  Insurance coverage/medication affordability: Medicare/Medicaid  Current diabetes medications include:  -Lantus  38 units daily -Humalog  10u before meals (usually takes 1-2x daily) -Ozempic  2mg  weekly   Patient reported dietary habits:  -Trying to limit himself to 1 meal day in an effort to lose weight   Patient-reported exercise habits: none   Patient denies nocturia (nighttime urination).  Patient denies neuropathy (nerve pain). Patient denies visual changes. Patient reports self foot exams.    Patient-reported blood sugar: 100 - 130 reported. Tells me mostly in the 90s-120s. ***  O:  Physical Exam  ROS  Lab Results  Component Value Date   HGBA1C 8.0 (A) 08/30/2023   There were no vitals filed for this visit.  Lipid Panel     Component Value Date/Time   CHOL 175 08/03/2022 1456   TRIG 171 (H) 08/03/2022 1456   HDL 50 08/03/2022 1456   CHOLHDL 3.5 08/03/2022 1456   CHOLHDL 3.3 09/30/2016 0651   VLDL 35 09/30/2016 0651   LDLCALC 96 08/03/2022 1456    Clinical Atherosclerotic Cardiovascular Disease (ASCVD): No  The 10-year ASCVD risk score (Arnett DK, et al., 2019) is: 25.6%   Values used to calculate the score:     Age: 68 years     Sex: Male     Is Non-Hispanic African American: No     Diabetic: Yes     Tobacco  smoker: Yes     Systolic Blood Pressure: 109 mmHg     Is BP treated: Yes     HDL Cholesterol: 50 mg/dL     Total Cholesterol: 175 mg/dL    A/P: Diabetes longstanding and currently not at goal. However, home CBGs reveal improvement since last month. I am unavble to verify his reported sugars but those he reports are at goal. He admits that his A1c wasa likely elevated d/t to dietary indiscretion with increased consumption of sugary foods around the holidays. Patient is able to verbalize appropriate hypoglycemia management plan.  -Continue current regimen. *** -Extensively discussed pathophysiology of diabetes, recommended lifestyle interventions, dietary effects on blood sugar control -Counseled on s/sx of and management of hypoglycemia -Next A1C anticipated 11/2023.  Written patient instructions provided.  Total time spent in counseling: 20 minutes.    Follow up PCP: 01/02/24  Pharmacy: ***    Juleen Oakland, PharmD PGY1 Pharmacy Resident

## 2023-10-03 ENCOUNTER — Other Ambulatory Visit: Payer: Self-pay

## 2023-10-03 ENCOUNTER — Ambulatory Visit: Admitting: Pharmacist

## 2023-10-10 ENCOUNTER — Other Ambulatory Visit: Payer: Self-pay | Admitting: Family Medicine

## 2023-10-11 ENCOUNTER — Other Ambulatory Visit: Payer: Self-pay

## 2023-10-11 MED ORDER — METHOCARBAMOL 750 MG PO TABS
750.0000 mg | ORAL_TABLET | Freq: Three times a day (TID) | ORAL | 1 refills | Status: DC | PRN
  Filled 2023-10-11: qty 90, 30d supply, fill #0
  Filled 2023-11-16: qty 90, 30d supply, fill #1

## 2023-10-12 ENCOUNTER — Other Ambulatory Visit: Payer: Self-pay

## 2023-10-13 ENCOUNTER — Other Ambulatory Visit: Payer: Self-pay

## 2023-10-17 ENCOUNTER — Other Ambulatory Visit: Payer: Self-pay

## 2023-10-18 ENCOUNTER — Other Ambulatory Visit: Payer: Self-pay | Admitting: Critical Care Medicine

## 2023-10-18 ENCOUNTER — Other Ambulatory Visit: Payer: Self-pay

## 2023-10-18 MED ORDER — FLUTICASONE-SALMETEROL 250-50 MCG/ACT IN AEPB
1.0000 | INHALATION_SPRAY | Freq: Two times a day (BID) | RESPIRATORY_TRACT | 6 refills | Status: DC
Start: 2023-10-18 — End: 2024-03-15
  Filled 2023-10-18: qty 60, 30d supply, fill #0
  Filled 2023-12-23: qty 60, 30d supply, fill #1
  Filled 2024-01-24: qty 60, 30d supply, fill #2
  Filled 2024-02-24 – 2024-03-14 (×2): qty 60, 30d supply, fill #3

## 2023-10-19 ENCOUNTER — Other Ambulatory Visit: Payer: Self-pay

## 2023-10-20 ENCOUNTER — Other Ambulatory Visit: Payer: Self-pay

## 2023-10-21 ENCOUNTER — Other Ambulatory Visit: Payer: Self-pay

## 2023-10-24 ENCOUNTER — Ambulatory Visit: Payer: Self-pay | Admitting: Internal Medicine

## 2023-10-24 ENCOUNTER — Ambulatory Visit
Admission: RE | Admit: 2023-10-24 | Discharge: 2023-10-24 | Disposition: A | Discharge status: Home / Self Care | Source: Ambulatory Visit | Attending: Internal Medicine | Admitting: Internal Medicine

## 2023-10-24 DIAGNOSIS — M87052 Idiopathic aseptic necrosis of left femur: Secondary | ICD-10-CM

## 2023-10-24 DIAGNOSIS — M1712 Unilateral primary osteoarthritis, left knee: Secondary | ICD-10-CM | POA: Diagnosis not present

## 2023-10-24 DIAGNOSIS — M87852 Other osteonecrosis, left femur: Secondary | ICD-10-CM | POA: Diagnosis not present

## 2023-10-24 DIAGNOSIS — G8929 Other chronic pain: Secondary | ICD-10-CM

## 2023-10-24 NOTE — Progress Notes (Signed)
 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  Let me know if he would like for me to refer him to orthopedics

## 2023-10-26 NOTE — Telephone Encounter (Signed)
 Copied from CRM [id number]. Topic: Clinical - Lab/Test Results >> Oct 26, 2023  5:08 PM Joshua [name] wrote:  Reason for CRM: The patient would like to please be contacted to review their recent imaging from 10/24/23 when possible

## 2023-10-28 ENCOUNTER — Other Ambulatory Visit: Payer: Self-pay | Admitting: Internal Medicine

## 2023-10-28 DIAGNOSIS — M87052 Idiopathic aseptic necrosis of left femur: Secondary | ICD-10-CM

## 2023-10-28 DIAGNOSIS — M1712 Unilateral primary osteoarthritis, left knee: Secondary | ICD-10-CM

## 2023-10-31 ENCOUNTER — Other Ambulatory Visit: Payer: Self-pay

## 2023-10-31 ENCOUNTER — Other Ambulatory Visit: Payer: Self-pay | Admitting: Internal Medicine

## 2023-10-31 DIAGNOSIS — G8929 Other chronic pain: Secondary | ICD-10-CM

## 2023-10-31 DIAGNOSIS — G43009 Migraine without aura, not intractable, without status migrainosus: Secondary | ICD-10-CM

## 2023-10-31 MED ORDER — SUMATRIPTAN SUCCINATE 50 MG PO TABS
ORAL_TABLET | ORAL | 3 refills | Status: DC
  Filled 2023-10-31: qty 10, 30d supply, fill #0
  Filled 2023-11-04 – 2023-11-08 (×4): qty 10, 30d supply, fill #1
  Filled 2023-12-08: qty 10, 30d supply, fill #2
  Filled 2024-02-02: qty 10, 30d supply, fill #3

## 2023-10-31 MED ORDER — DICLOFENAC SODIUM 50 MG PO TBEC
50.0000 mg | DELAYED_RELEASE_TABLET | Freq: Two times a day (BID) | ORAL | 2 refills | Status: AC | PRN
  Filled 2023-10-31: qty 60, 30d supply, fill #0

## 2023-11-01 ENCOUNTER — Other Ambulatory Visit: Payer: Self-pay

## 2023-11-03 ENCOUNTER — Telehealth: Payer: Self-pay | Admitting: Sports Medicine

## 2023-11-03 NOTE — Telephone Encounter (Signed)
 Patient called and said he can't walk or move. He stated that it feels like something is sticking him inside his hip. The pain medication isn't working. He wants to know what else to do. CB#[phone]

## 2023-11-04 ENCOUNTER — Other Ambulatory Visit: Payer: Self-pay

## 2023-11-04 NOTE — Telephone Encounter (Signed)
 LMOM for patient to call back and schedule an appt with a surgeon; ;[name] or [name]  He needs surgery and seeing Burnetta is not necessary

## 2023-11-07 ENCOUNTER — Other Ambulatory Visit: Payer: Self-pay

## 2023-11-08 ENCOUNTER — Other Ambulatory Visit: Payer: Self-pay

## 2023-11-09 ENCOUNTER — Ambulatory Visit: Payer: Self-pay

## 2023-11-09 NOTE — Telephone Encounter (Signed)
 FYI Only or Action Required?: Action required by provider: request for appointment and update on patient condition.  Pt would like call back from doctor if possible. No appts were available with PCP.  Patient was last seen in primary care on 08/30/2023 by [name], MD.  Called Nurse Triage reporting Leg Pain.  Symptoms began several months ago.  Interventions attempted: Nothing.  Symptoms are: gradually worsening.  Triage Disposition: See PCP When Office is Open (Within 3 Days)  Patient/caregiver understands and will follow disposition?: Yes, will follow disposition  Copied from CRM [id number]. Topic: Clinical - Red Word Triage >> Nov 09, 2023  4:36 PM Delon DASEN wrote: Red Word that prompted transfer to Nurse Triage: left leg swollen and painful, unable to put weight on it- pain level 8 Reason for Disposition  [1] MODERATE pain (e.g., interferes with normal activities, limping) AND [2] present > 3 days  Answer Assessment - Initial Assessment Questions 1. ONSET: When did the pain start?      Year ago 2. LOCATION: Where is the pain located?      L leg, whole leg 3. PAIN: How bad is the pain?    (Scale 1-10; or mild, moderate, severe)     8/10 4. WORK OR EXERCISE: Has there been any recent work or exercise that involved this part of the body?      denies 5. CAUSE: What do you think is causing the leg pain?     Pt was told he needs a hip replacement 6. OTHER SYMPTOMS: Do you have any other symptoms? (e.g., chest pain, back pain, breathing difficulty, swelling, rash, fever, numbness, weakness)     Swelling,  Protocols used: Leg Pain-A-AH

## 2023-11-10 ENCOUNTER — Ambulatory Visit: Payer: Self-pay

## 2023-11-10 NOTE — Telephone Encounter (Signed)
 No appt available,states he doesn't have ride to get to [facility].

## 2023-11-10 NOTE — Telephone Encounter (Signed)
 FYI Only or Action Required?: Action required by provider: clinical question for provider and update on patient condition.  Patient was last seen in primary care on 08/30/2023 by [name], MD.  Called Nurse Triage reporting Leg Swelling.  Symptoms began several weeks ago.  Interventions attempted: Prescription medications: tramadol  and muscle relaxer.  Symptoms are: gradually improving.  Triage Disposition: See Physician Within 24 Hours  Patient/caregiver understands and will follow disposition?: Yes    Copied from CRM [id number]. Topic: Clinical - Red Word Triage >> Nov 10, 2023 10:43 AM Joshua [name] wrote: Red Word that prompted transfer to Nurse Triage: Patient is experiencing severe foot pain and left is still a little swollen Reason for Disposition  [1] MODERATE leg swelling (e.g., swelling extends up to knees) AND [2] new-onset or getting worse  Answer Assessment - Initial Assessment Questions 1. ONSET: When did the swelling start? (e.g., minutes, hours, days)     2-3  weeks 2. LOCATION: What part of the leg is swollen?  Are both legs swollen or just one leg?     Left foot up to knee 3. SEVERITY: How bad is the swelling? (e.g., localized; mild, moderate, severe)     mod 4. REDNESS: Is there redness or signs of infection?     no 5. PAIN: Is the swelling painful to touch? If Yes, ask: How painful is it?   (Scale 1-10; mild, moderate or severe)     8/10 was walking  7. CAUSE: What do you think is causing the leg swelling?     unknown  10. OTHER SYMPTOMS: Do you have any other symptoms? (e.g., chest pain, difficulty breathing)      leg Pain  Protocols used: Leg Swelling and Edema-A-AH

## 2023-11-11 ENCOUNTER — Ambulatory Visit: Payer: Self-pay

## 2023-11-11 ENCOUNTER — Observation Stay (HOSPITAL_COMMUNITY)
Admission: EM | Admit: 2023-11-11 | Discharge: 2023-11-13 | Disposition: A | Discharge status: Home Health | Attending: Emergency Medicine | Admitting: Emergency Medicine

## 2023-11-11 ENCOUNTER — Ambulatory Visit: Admitting: Sports Medicine

## 2023-11-11 ENCOUNTER — Other Ambulatory Visit: Payer: Self-pay

## 2023-11-11 ENCOUNTER — Emergency Department (HOSPITAL_COMMUNITY)

## 2023-11-11 DIAGNOSIS — R600 Localized edema: Secondary | ICD-10-CM | POA: Diagnosis present

## 2023-11-11 DIAGNOSIS — Z794 Long term (current) use of insulin: Secondary | ICD-10-CM | POA: Diagnosis not present

## 2023-11-11 DIAGNOSIS — F209 Schizophrenia, unspecified: Secondary | ICD-10-CM | POA: Diagnosis not present

## 2023-11-11 DIAGNOSIS — I129 Hypertensive chronic kidney disease with stage 1 through stage 4 chronic kidney disease, or unspecified chronic kidney disease: Secondary | ICD-10-CM | POA: Insufficient documentation

## 2023-11-11 DIAGNOSIS — N179 Acute kidney failure, unspecified: Secondary | ICD-10-CM | POA: Diagnosis not present

## 2023-11-11 DIAGNOSIS — M87852 Other osteonecrosis, left femur: Secondary | ICD-10-CM | POA: Diagnosis not present

## 2023-11-11 DIAGNOSIS — E1122 Type 2 diabetes mellitus with diabetic chronic kidney disease: Secondary | ICD-10-CM | POA: Insufficient documentation

## 2023-11-11 DIAGNOSIS — F17220 Nicotine dependence, chewing tobacco, uncomplicated: Secondary | ICD-10-CM | POA: Insufficient documentation

## 2023-11-11 DIAGNOSIS — Z6837 Body mass index (BMI) 37.0-37.9, adult: Secondary | ICD-10-CM | POA: Insufficient documentation

## 2023-11-11 DIAGNOSIS — M5442 Lumbago with sciatica, left side: Secondary | ICD-10-CM | POA: Insufficient documentation

## 2023-11-11 DIAGNOSIS — I2699 Other pulmonary embolism without acute cor pulmonale: Secondary | ICD-10-CM | POA: Diagnosis not present

## 2023-11-11 DIAGNOSIS — K8020 Calculus of gallbladder without cholecystitis without obstruction: Secondary | ICD-10-CM | POA: Diagnosis not present

## 2023-11-11 DIAGNOSIS — K219 Gastro-esophageal reflux disease without esophagitis: Secondary | ICD-10-CM | POA: Insufficient documentation

## 2023-11-11 DIAGNOSIS — I2510 Atherosclerotic heart disease of native coronary artery without angina pectoris: Secondary | ICD-10-CM | POA: Diagnosis not present

## 2023-11-11 DIAGNOSIS — R0902 Hypoxemia: Secondary | ICD-10-CM

## 2023-11-11 DIAGNOSIS — F250 Schizoaffective disorder, bipolar type: Secondary | ICD-10-CM | POA: Diagnosis present

## 2023-11-11 DIAGNOSIS — F17200 Nicotine dependence, unspecified, uncomplicated: Secondary | ICD-10-CM | POA: Diagnosis present

## 2023-11-11 DIAGNOSIS — F319 Bipolar disorder, unspecified: Secondary | ICD-10-CM | POA: Diagnosis not present

## 2023-11-11 DIAGNOSIS — J449 Chronic obstructive pulmonary disease, unspecified: Secondary | ICD-10-CM | POA: Insufficient documentation

## 2023-11-11 DIAGNOSIS — R911 Solitary pulmonary nodule: Secondary | ICD-10-CM | POA: Diagnosis not present

## 2023-11-11 DIAGNOSIS — N1831 Chronic kidney disease, stage 3a: Secondary | ICD-10-CM | POA: Diagnosis not present

## 2023-11-11 DIAGNOSIS — R739 Hyperglycemia, unspecified: Secondary | ICD-10-CM | POA: Diagnosis not present

## 2023-11-11 DIAGNOSIS — E66812 Obesity, class 2: Secondary | ICD-10-CM | POA: Insufficient documentation

## 2023-11-11 DIAGNOSIS — M79605 Pain in left leg: Secondary | ICD-10-CM | POA: Diagnosis not present

## 2023-11-11 DIAGNOSIS — I82492 Acute embolism and thrombosis of other specified deep vein of left lower extremity: Principal | ICD-10-CM

## 2023-11-11 LAB — CBC WITH DIFFERENTIAL/PLATELET
Abs Immature Granulocytes: 0.03 K/uL (ref 0.00–0.07)
Basophils Absolute: 0.1 K/uL (ref 0.0–0.1)
Basophils Relative: 1 %
Eosinophils Absolute: 0.3 K/uL (ref 0.0–0.5)
Eosinophils Relative: 4 %
HCT: 49.1 % (ref 39.0–52.0)
Hemoglobin: 16.1 g/dL (ref 13.0–17.0)
Immature Granulocytes: 0 %
Lymphocytes Relative: 22 %
Lymphs Abs: 1.7 K/uL (ref 0.7–4.0)
MCH: 31 pg (ref 26.0–34.0)
MCHC: 32.8 g/dL (ref 30.0–36.0)
MCV: 94.4 fL (ref 80.0–100.0)
Monocytes Absolute: 0.7 K/uL (ref 0.1–1.0)
Monocytes Relative: 9 %
Neutro Abs: 5.2 K/uL (ref 1.7–7.7)
Neutrophils Relative %: 64 %
Platelets: 153 K/uL (ref 150–400)
RBC: 5.2 MIL/uL (ref 4.22–5.81)
RDW: 15.2 % (ref 11.5–15.5)
WBC: 8.1 K/uL (ref 4.0–10.5)
nRBC: 0 % (ref 0.0–0.2)

## 2023-11-11 LAB — BASIC METABOLIC PANEL WITH GFR
Anion gap: 10 (ref 5–15)
BUN: 30 mg/dL — ABNORMAL HIGH (ref 8–23)
CO2: 27 mmol/L (ref 22–32)
Calcium: 8.5 mg/dL — ABNORMAL LOW (ref 8.9–10.3)
Chloride: 99 mmol/L (ref 98–111)
Creatinine, Ser: 1.59 mg/dL — ABNORMAL HIGH (ref 0.61–1.24)
GFR, Estimated: 48 mL/min — ABNORMAL LOW (ref >60)
Glucose, Bld: 216 mg/dL — ABNORMAL HIGH (ref 70–99)
Potassium: 4.5 mmol/L (ref 3.5–5.1)
Sodium: 136 mmol/L (ref 135–145)

## 2023-11-11 LAB — D-DIMER, QUANTITATIVE: D-Dimer, Quant: 1.17 ug{FEU}/mL — ABNORMAL HIGH (ref 0.00–0.50)

## 2023-11-11 LAB — I-STAT CG4 LACTIC ACID, ED
Lactic Acid, Venous: 1.6 mmol/L (ref 0.5–1.9)
Lactic Acid, Venous: 0.7 mmol/L (ref 0.5–1.9)

## 2023-11-11 LAB — TROPONIN I (HIGH SENSITIVITY)
Troponin I (High Sensitivity): 7 ng/L (ref <18)
Troponin I (High Sensitivity): 6 ng/L (ref <18)

## 2023-11-11 MED ORDER — IOHEXOL 350 MG/ML SOLN
75.0000 mL | Freq: Once | INTRAVENOUS | Status: AC | PRN
  Administered 2023-11-11: 75 mL via INTRAVENOUS

## 2023-11-11 MED ORDER — HEPARIN (PORCINE) 25000 UT/250ML-% IV SOLN
1800.0000 [IU]/h | INTRAVENOUS | Status: DC
  Administered 2023-11-11 – 2023-11-12 (×2): 1500 [IU]/h via INTRAVENOUS
  Filled 2023-11-11 (×3): qty 250

## 2023-11-11 MED ORDER — HEPARIN BOLUS VIA INFUSION
5000.0000 [IU] | Freq: Once | INTRAVENOUS | Status: AC
  Administered 2023-11-11: 5000 [IU] via INTRAVENOUS
  Filled 2023-11-11: qty 5000

## 2023-11-11 MED ORDER — SODIUM CHLORIDE 0.9 % IV BOLUS
1000.0000 mL | Freq: Once | INTRAVENOUS | Status: AC
  Administered 2023-11-11: 1000 mL via INTRAVENOUS

## 2023-11-11 MED ORDER — IPRATROPIUM-ALBUTEROL 0.5-2.5 (3) MG/3ML IN SOLN
3.0000 mL | Freq: Once | RESPIRATORY_TRACT | Status: AC
  Administered 2023-11-11: 3 mL via RESPIRATORY_TRACT
  Filled 2023-11-11: qty 3

## 2023-11-11 MED ORDER — IPRATROPIUM-ALBUTEROL 0.5-2.5 (3) MG/3ML IN SOLN
RESPIRATORY_TRACT | Status: AC
  Administered 2023-11-11: 3 mL
  Filled 2023-11-11: qty 3

## 2023-11-11 NOTE — ED Provider Notes (Signed)
 [place] EMERGENCY DEPARTMENT AT [facility] Provider Note   CSN: [id number] Arrival date & time: 11/11/23  2025     Patient presents with: Leg Pain   EDDY [name] is a 68 y.o. male.   This is a 68 year old male here today for left leg pain, leg swelling that has been worsening over the last couple of days.  Patient has a history of avascular necrosis of the left hip, has had pain there for some time, however the last couple of days he has had swelling in the area.  Patient with a smoking history.   Leg Pain      Prior to Admission medications   Medication Sig Start Date End Date Taking? Authorizing Provider  Accu-Chek FastClix Lancets MISC Use to check blood sugars three times per day 12/07/22   Brien Belvie BRAVO, MD  albuterol  (PROAIR  HFA) 108 [phone] Base) MCG/ACT inhaler Inhale 2 puffs into the lungs every 4 (four) hours as needed for wheezing or shortness of breath. 07/14/23   [name], MD  blood glucose meter kit and supplies KIT Dispense based on patient and insurance preference. Use up to four times daily as directed. ICD-10 E11.65  Z79.4 07/20/19   Fulp, Cammie, MD  Blood Glucose Monitoring Suppl (ACCU-CHEK GUIDE ME) w/Device KIT Use to check blood sugars up to 3 times per day. ICD-10 E11.65, Z79.4 03/03/20   Alec House, MD  Blood Pressure Monitoring (BLOOD PRESSURE KIT) DEVI Use to measure blood pressure 09/29/21   Brien Belvie BRAVO, MD  cetirizine  (ZYRTEC ) 10 MG tablet Take 1 tablet (10 mg total) by mouth daily. 12/07/22   Brien Belvie BRAVO, MD  diclofenac  (VOLTAREN ) 50 MG EC tablet Take 1 tablet (50 mg total) by mouth 2 (two) times daily as needed for moderate pain. Eat before taking the medicine. 10/31/23   [name], MD  fluticasone -salmeterol (ADVAIR ) 250-50 MCG/ACT AEPB Inhale 1 puff into the lungs 2 (two) times daily. 10/18/23   [name], MD  gabapentin  (NEURONTIN ) 300 MG capsule Take 2 capsules (600 mg total) by mouth 3 (three) times  daily. 08/17/23   [name], MD  glucose blood (ACCU-CHEK GUIDE) test strip Use as instructed to check blood sugars three times per day. 12/07/22   Brien Belvie BRAVO, MD  hydrOXYzine  (ATARAX ) 50 MG tablet Take 1 tablet (50 mg total) by mouth 3 (three) times daily. 12/07/22   Brien Belvie BRAVO, MD  insulin  glargine (LANTUS ) 100 unit/mL SOPN Inject 38 Units into the skin at bedtime. 07/20/23   [name], MD  insulin  lispro (HUMALOG  KWIKPEN) 100 UNIT/ML KwikPen Inject 10 Units into the skin 3 (three) times daily. 08/30/23   [name], MD  Insulin  Pen Needle (PEN NEEDLES) 32G X 4 MM MISC Use to inject insulin  four times daily. Also, Ozempic  once weekly. 04/26/23   Brien Belvie BRAVO, MD  ipratropium-albuterol  (DUONEB) 0.5-2.5 (3) MG/3ML SOLN Take 3 mLs by nebulization every 6 (six) hours as needed (shortness of breath). 08/30/23   [name], MD  Lancets Misc. (ACCU-CHEK FASTCLIX LANCET) KIT Use to check blood sugars 3 times per day 03/03/20   Fulp, House, MD  methocarbamol  (ROBAXIN ) 750 MG tablet Take 1 tablet (750 mg total) by mouth every 8 (eight) hours as needed for muscle spasms. 10/11/23   [name], MD  omeprazole  (PRILOSEC) 20 MG capsule Take 1 capsule (20 mg total) by mouth daily. 07/14/23   [name], MD  paliperidone  (INVEGA ) 6 MG 24 hr tablet Take 6 mg by mouth at bedtime. 07/05/22   [provider]  Respiratory Therapy Supplies (FLUTTER) DEVI Use with 4 times daily Patient not taking: Reported on 04/08/2023 03/27/19   Brien Belvie BRAVO, MD  Semaglutide , 2 MG/DOSE, (OZEMPIC , 2 MG/DOSE,) 8 MG/3ML SOPN Inject 2 mg as directed once a week. 09/30/23   [name], MD  Spacer/Aero-Holding Chambers (AEROCHAMBER MV) inhaler Use as instructed Patient not taking: Reported on 04/08/2023 03/27/19   Brien Belvie BRAVO, MD  SUMAtriptan  (IMITREX ) 50 MG tablet Take 1 tablet by mouth at the start of headache.  May repeat in 2 hours if headache persists. Max  2 tabs/24hr. 10/31/23   [name], MD  traMADol  (ULTRAM ) 50 MG tablet Take 1 tablet (50 mg total) by mouth every 6 (six) hours as needed for moderate pain (pain score 4-6) or severe pain (pain score 7-10). 09/20/23   [name], MD  valsartan -hydrochlorothiazide  (DIOVAN -HCT) 320-25 MG tablet Take 1 tablet by mouth daily. 08/30/23   [name], MD    Allergies: Statins, Haldol [haloperidol], Metformin  and related, and Trazodone  and nefazodone    Review of Systems  Updated Vital Signs BP 109/67   Pulse 89   Temp 98.3 F (36.8 C)   Resp 20   Ht 5' 9 (1.753 m)   Wt 115 kg   SpO2 97%   BMI 37.44 kg/m   Physical Exam Vitals and nursing note reviewed.  HENT:     Head: Normocephalic.     Mouth/Throat:     Mouth: Mucous membranes are dry.  Eyes:     Pupils: Pupils are equal, round, and reactive to light.  Cardiovascular:     Rate and Rhythm: Normal rate.     Comments: Dopplerable pulses on left lower extremity. Pulmonary:     Effort: Pulmonary effort is normal.     Comments: Diminished air entry bilaterally Abdominal:     General: Abdomen is flat.     Palpations: Abdomen is soft.  Musculoskeletal:        General: Normal range of motion.     Cervical back: Normal range of motion.     Comments: Swelling of the left lower extremity  Skin:    Comments: No erythema, no crepitus  Neurological:     General: No focal deficit present.     Mental Status: He is alert.     (all labs ordered are listed, but only abnormal results are displayed) Labs Reviewed  BASIC METABOLIC PANEL WITH GFR - Abnormal; Notable for the following components:      Result Value   Glucose, Bld 216 (*)    BUN 30 (*)    Creatinine, Ser 1.59 (*)    Calcium  8.5 (*)    GFR, Estimated 48 (*)    All other components within normal limits  D-DIMER, QUANTITATIVE - Abnormal; Notable for the following components:   D-Dimer, Quant 1.17 (*)    All other components within normal limits   CULTURE, BLOOD (ROUTINE X 2)  CULTURE, BLOOD (ROUTINE X 2)  CBC WITH DIFFERENTIAL/PLATELET  I-STAT CG4 LACTIC ACID, ED  I-STAT CG4 LACTIC ACID, ED  TROPONIN I (HIGH SENSITIVITY)  TROPONIN I (HIGH SENSITIVITY)    EKG: EKG Interpretation Date/Time:  Friday November 11 2023 21:10:10 EDT Ventricular Rate:  90 PR Interval:  178 QRS Duration:  83 QT Interval:  354 QTC Calculation: 434 R Axis:   80  Text Interpretation: Sinus  rhythm Anterior infarct, old Minimal ST elevation, inferior leads Confirmed by Mannie Pac [phone]) on 11/11/2023 9:15:22 PM  Radiology: CT Angio Chest PE W and/or Wo Contrast Result Date: 11/11/2023 EXAM: CTA of the Chest with contrast for PE 11/11/2023 10:12:34 PM TECHNIQUE: CTA of the chest was performed after the administration of intravenous contrast. Multiplanar reformatted images are provided for review. MIP images are provided for review. Automated exposure control, iterative reconstruction, and/or weight based adjustment of the mA/kV was utilized to reduce the radiation dose to as low as reasonably achievable. COMPARISON: None available. CLINICAL HISTORY: Pulmonary embolism (PE) suspected, high prob. FINDINGS: PULMONARY ARTERIES: Segmental and subsegmental bilateral lower lobe pulmonary emboli are present (images 75, 79, and 91). Overall clot burden is small. No right heart strain. MEDIASTINUM: Mild thoracic aortic atherosclerosis. Mild coronary atherosclerosis of the LAD. LYMPH NODES: No mediastinal, hilar or axillary lymphadenopathy. LUNGS AND PLEURA: Mild centrilobular and paracentesis changes, upper lung predominant. 12 mm irregular right lower lobe nodule with mild surrounding ground glass opacity (image 102), poorly evaluated due to motion artifact, but favoring mild infection/inflammation. Consider follow up CT chest in 3 to 6 months. No pleural effusion or pneumothorax. UPPER ABDOMEN: 3.6 cm laminated gallstone ( image 165 ). 5.3 cm unlocular cyst in the  pancreatic tail ( image 157 ), poorly evaluated due to motion degredation, but favoring a benign pseudocyst. SOFT TISSUES AND BONES: Degenerative changes of the visualized thoracolumbar spine. IMPRESSION: 1. Segmental/subsegmental bilateral lower lobe pulmonary emboli. Overall clot burden is small. No right heart strain. 2. 12 mm irregular right lower lobe nodule, favoring mild infection/inflammation. Consider follow-up CT chest in 3 to 6 months. 3. Critical Value/emergent results were called by telephone at the time of interpretation on 11/11/2023 at 2222 hrs to provider Dr Mannie, who verbally acknowledged these results. Electronically signed by: Pinkie Pebbles MD 11/11/2023 10:23 PM EDT RP Workstation: [id number]     .Critical Care  Performed by: Mannie Pac DASEN, DO Authorized by: Mannie Pac DASEN, DO   Critical care provider statement:    Critical care time (minutes):  33   Critical care was necessary to treat or prevent imminent or life-threatening deterioration of the following conditions:  Respiratory failure   Critical care was time spent personally by me on the following activities:  Development of treatment plan with patient or surrogate, discussions with consultants, evaluation of patient's response to treatment, examination of patient, ordering and review of laboratory studies, ordering and review of radiographic studies, ordering and performing treatments and interventions, pulse oximetry, re-evaluation of patient's condition and review of old charts    Medications Ordered in the ED  sodium chloride  0.9 % bolus 1,000 mL (0 mLs Intravenous Stopped 11/11/23 2254)  iohexol (OMNIPAQUE) 350 MG/ML injection 75 mL (75 mLs Intravenous Contrast Given 11/11/23 2212)                                    Medical Decision Making 68 year old male here today with left leg pain.  Differential diagnoses include DVT, less likely acute ischemic leg, peripheral vascular disease, less likely  cellulitis, less likely necrotizing soft tissue infection.  Plan-patient's blood pressure was little bit on the softer side when he arrived.  Does appear somewhat dry.  Patient with an extensive smoking history, I believe he probably has some peripheral vascular disease.  O2 saturation 92% on room air.  I think this is likely where the patient  lives given his smoking history.  Ordered D-dimer which was positive, CTA ordered.  I do not have access to vascular ultrasound at this hour.  Reassessment 1050pm- CTA showing small pulmonary emboli.  This likely is not the main factor in the patient's mild hypoxia, and I still maintain that I believe that his hypoxia is likely chronic for him given his smoking history.  However, I cannot reasonably exclude the possibility that this clot burden is contributing to the patient's O2 requirement.  Given that, I will start the patient on heparin and admit to the hospital.  Amount and/or Complexity of Data Reviewed Labs: ordered. Radiology: ordered.  Risk Prescription drug management. Decision regarding hospitalization.        Final diagnoses:  Other acute pulmonary embolism without acute [facility])  Hypoxia    ED Discharge Orders     None          Mannie Fairy DASEN, DO 11/11/23 2256

## 2023-11-11 NOTE — Telephone Encounter (Signed)
Call to patient - unable to leave message.

## 2023-11-11 NOTE — Telephone Encounter (Signed)
 Instructed wife to take pt to ED due to worsen sob with exertion, states that she will try to take him into it. Pt in the background states that he doesn't want to go to the ED.

## 2023-11-11 NOTE — Telephone Encounter (Signed)
 FYI Only or Action Required?: Action required by provider: request for appointment.  Patient was last seen in primary care on 08/30/2023 by [name], MD.  Called Nurse Triage reporting Leg Swelling.  Symptoms began several weeks ago.  Interventions attempted: Rest, hydration, or home remedies and Ice/heat application.  Symptoms are: gradually worsening.  Triage Disposition: Go to ED Now (or PCP Triage)  Patient/caregiver understands and will follow disposition?: Unsure        Copied from CRM [id number]. Topic: Clinical - Red Word Triage >> Nov 11, 2023  9:32 AM Vena H wrote: Red Word that prompted transfer to Nurse Triage: Pt's wife called in stating that pt's left leg is swollen and is not getting better at all. They would like to see his PCP if possible. Reason for Disposition  [1] Difficulty breathing with exertion (e.g., walking) and [2] NEW or getting WORSE  Answer Assessment - Initial Assessment Questions 1. ONSET: When did the swelling start? (e.g., minutes, hours, days)     2-3 weeks  2. LOCATION: What part of the leg is swollen?  Are both legs swollen or just one leg?     legs 3. SEVERITY: How bad is the swelling? (e.g., localized; mild, moderate, severe)     Mod-severe 4. REDNESS: Is there redness or signs of infection?     no 5. PAIN: Is the swelling painful to touch? If Yes, ask: How painful is it?   (Scale 1-10; mild, moderate or severe)     Hurts to walk on it 6. FEVER: Do you have a fever? If Yes, ask: What is it, how was it measured, and when did it start?      no 7. CAUSE: What do you think is causing the leg swelling?     unknown 8. MEDICAL HISTORY: Do you have a history of blood clots (e.g., DVT), cancer, heart failure, kidney disease, or liver failure?     no 10. OTHER SYMPTOMS: Do you have any other symptoms? (e.g., chest pain, difficulty breathing)       no  Protocols used: Leg Swelling and Edema-A-AH

## 2023-11-11 NOTE — Plan of Care (Incomplete)
 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  ED course: SBP in the 80s and tachycardic to the 110s on arrival.  Afebrile.  Oxygen saturation in the low 90s on room air and placed on 2 L [place].  Patient had dopplerable pulses of the left lower extremity.  Labs notable for glucose 216, BUN 30, creatinine 1.6 (baseline 1.3), blood cultures in process, D-dimer 1.17, troponin negative x 2, lactic acid normal x 2.  CT angiogram chest showing segmental/subsegmental bilateral lower lobe PE with overall small clot burden and no evidence of right heart strain.  Patient was given DuoNeb, 1 L IV fluids, and started on IV heparin infusion.  Hypotension resolved after 1 L IV fluids.  EKG: Sinus rhythm.   12 mm irregular right lower lobe nodule, favoring mild infection/inflammation. Consider follow-up CT chest in 3 to 6 months.

## 2023-11-11 NOTE — ED Notes (Signed)
 CCMD called.

## 2023-11-11 NOTE — ED Notes (Signed)
 Provider made aware of BP's.

## 2023-11-11 NOTE — ED Notes (Signed)
 Pt taken to CT.

## 2023-11-11 NOTE — Progress Notes (Signed)
 PHARMACY - ANTICOAGULATION CONSULT NOTE  Pharmacy Consult for heparin initiation Indication: pulmonary embolus  Allergies  Allergen Reactions   Statins Other (See Comments)    Myopathy   Haldol [Haloperidol] Other (See Comments)    Unk reaction   Metformin  And Related Diarrhea and Nausea And Vomiting   Trazodone  And Nefazodone Itching    Patient Measurements: Height: 5' 9 (175.3 cm) Weight: 115 kg (253 lb 8.5 oz) IBW/kg (Calculated) : 70.7 HEPARIN DW (KG): 96.4  Vital Signs: Temp: 98.3 F (36.8 C) (07/18 2032) BP: 96/57 (07/18 2140) Pulse Rate: 83 (07/18 2140)  Labs: Recent Labs    11/11/23 2055  HGB 16.1  HCT 49.1  PLT 153  CREATININE 1.59*  TROPONINIHS 7    Estimated Creatinine Clearance: 57.9 mL/min (A) (by C-G formula based on SCr of 1.59 mg/dL (H)).   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 HGB 16.1, PLT 153.   Goal of Therapy:  Heparin level 0.3-0.7 units/ml Monitor platelets by anticoagulation protocol: Yes   Plan:  Give IV heparin 5000 units bolus x 1 Start heparin infusion at 1500 units/hr Check anti-Xa level in 6 hours and daily while on heparin Continue to monitor H&H and platelets F/u long-term AC plan    Lindzie Boxx, PharnD Candidate 11/11/2023,10:50 PM

## 2023-11-11 NOTE — ED Triage Notes (Addendum)
 Pt with chronic left leg/hip pain x 6 months but got worse over the last few days. Needs a hip replacement but unable to have it done due to health issues.

## 2023-11-12 ENCOUNTER — Observation Stay (HOSPITAL_BASED_OUTPATIENT_CLINIC_OR_DEPARTMENT_OTHER)

## 2023-11-12 ENCOUNTER — Other Ambulatory Visit (HOSPITAL_COMMUNITY): Payer: Self-pay

## 2023-11-12 DIAGNOSIS — I2609 Other pulmonary embolism with acute cor pulmonale: Secondary | ICD-10-CM | POA: Diagnosis not present

## 2023-11-12 DIAGNOSIS — I2699 Other pulmonary embolism without acute cor pulmonale: Secondary | ICD-10-CM

## 2023-11-12 DIAGNOSIS — N179 Acute kidney failure, unspecified: Secondary | ICD-10-CM

## 2023-11-12 LAB — CBC
HCT: 47.2 % (ref 39.0–52.0)
Hemoglobin: 15.2 g/dL (ref 13.0–17.0)
MCH: 30.6 pg (ref 26.0–34.0)
MCHC: 32.2 g/dL (ref 30.0–36.0)
MCV: 95.2 fL (ref 80.0–100.0)
Platelets: 128 K/uL — ABNORMAL LOW (ref 150–400)
RBC: 4.96 MIL/uL (ref 4.22–5.81)
RDW: 15.4 % (ref 11.5–15.5)
WBC: 8.2 K/uL (ref 4.0–10.5)
nRBC: 0 % (ref 0.0–0.2)

## 2023-11-12 LAB — GLUCOSE, CAPILLARY
Glucose-Capillary: 134 mg/dL — ABNORMAL HIGH (ref 70–99)
Glucose-Capillary: 102 mg/dL — ABNORMAL HIGH (ref 70–99)
Glucose-Capillary: 110 mg/dL — ABNORMAL HIGH (ref 70–99)
Glucose-Capillary: 56 mg/dL — ABNORMAL LOW (ref 70–99)
Glucose-Capillary: 61 mg/dL — ABNORMAL LOW (ref 70–99)
Glucose-Capillary: 128 mg/dL — ABNORMAL HIGH (ref 70–99)
Glucose-Capillary: 154 mg/dL — ABNORMAL HIGH (ref 70–99)

## 2023-11-12 LAB — COMPREHENSIVE METABOLIC PANEL WITH GFR
ALT: 16 U/L (ref 0–44)
AST: 15 U/L (ref 15–41)
Albumin: 2.9 g/dL — ABNORMAL LOW (ref 3.5–5.0)
Alkaline Phosphatase: 80 U/L (ref 38–126)
Anion gap: 7 (ref 5–15)
BUN: 27 mg/dL — ABNORMAL HIGH (ref 8–23)
CO2: 28 mmol/L (ref 22–32)
Calcium: 8.6 mg/dL — ABNORMAL LOW (ref 8.9–10.3)
Chloride: 101 mmol/L (ref 98–111)
Creatinine, Ser: 1.42 mg/dL — ABNORMAL HIGH (ref 0.61–1.24)
GFR, Estimated: 55 mL/min — ABNORMAL LOW (ref >60)
Glucose, Bld: 122 mg/dL — ABNORMAL HIGH (ref 70–99)
Potassium: 4.4 mmol/L (ref 3.5–5.1)
Sodium: 136 mmol/L (ref 135–145)
Total Bilirubin: 0.5 mg/dL (ref 0.0–1.2)
Total Protein: 5.8 g/dL — ABNORMAL LOW (ref 6.5–8.1)

## 2023-11-12 LAB — HEPARIN LEVEL (UNFRACTIONATED)
Heparin Unfractionated: 0.36 [IU]/mL (ref 0.30–0.70)
Heparin Unfractionated: <0.1 [IU]/mL — ABNORMAL LOW (ref 0.30–0.70)

## 2023-11-12 LAB — ECHOCARDIOGRAM COMPLETE
AV Peak grad: 4.9 mmHg
Ao pk vel: 1.11 m/s
Area-P 1/2: 4.06 cm2
Height: 69 in
Weight: 4056.46 [oz_av]

## 2023-11-12 LAB — HIV ANTIBODY (ROUTINE TESTING W REFLEX): HIV Screen 4th Generation wRfx: NONREACTIVE

## 2023-11-12 LAB — BRAIN NATRIURETIC PEPTIDE: B Natriuretic Peptide: 62 pg/mL (ref 0.0–100.0)

## 2023-11-12 MED ORDER — FLUTICASONE FUROATE-VILANTEROL 200-25 MCG/ACT IN AEPB
1.0000 | INHALATION_SPRAY | Freq: Every day | RESPIRATORY_TRACT | Status: DC
  Administered 2023-11-12 – 2023-11-13 (×2): 1 via RESPIRATORY_TRACT
  Filled 2023-11-12: qty 28

## 2023-11-12 MED ORDER — IPRATROPIUM-ALBUTEROL 0.5-2.5 (3) MG/3ML IN SOLN
3.0000 mL | Freq: Four times a day (QID) | RESPIRATORY_TRACT | Status: DC
  Administered 2023-11-12 – 2023-11-13 (×4): 3 mL via RESPIRATORY_TRACT
  Filled 2023-11-12 (×5): qty 3

## 2023-11-12 MED ORDER — PANTOPRAZOLE SODIUM 40 MG PO TBEC
40.0000 mg | DELAYED_RELEASE_TABLET | Freq: Every day | ORAL | Status: DC
  Administered 2023-11-12 – 2023-11-13 (×2): 40 mg via ORAL
  Filled 2023-11-12 (×2): qty 1

## 2023-11-12 MED ORDER — AZITHROMYCIN 250 MG PO TABS: 500.0000 mg | ORAL_TABLET | Freq: Every day | ORAL | Status: DC

## 2023-11-12 MED ORDER — IPRATROPIUM-ALBUTEROL 0.5-2.5 (3) MG/3ML IN SOLN: 3.0000 mL | Freq: Four times a day (QID) | RESPIRATORY_TRACT | Status: DC

## 2023-11-12 MED ORDER — METHOCARBAMOL 500 MG PO TABS
750.0000 mg | ORAL_TABLET | Freq: Three times a day (TID) | ORAL | Status: DC | PRN
  Administered 2023-11-13 (×2): 750 mg via ORAL
  Filled 2023-11-12 (×2): qty 2

## 2023-11-12 MED ORDER — DOXYCYCLINE HYCLATE 100 MG PO TABS
100.0000 mg | ORAL_TABLET | Freq: Two times a day (BID) | ORAL | 0 refills | Status: AC
  Filled 2023-11-12: qty 8, 4d supply, fill #0

## 2023-11-12 MED ORDER — GABAPENTIN 300 MG PO CAPS
600.0000 mg | ORAL_CAPSULE | Freq: Three times a day (TID) | ORAL | Status: DC
  Administered 2023-11-12 – 2023-11-13 (×5): 600 mg via ORAL
  Filled 2023-11-12 (×5): qty 2

## 2023-11-12 MED ORDER — INSULIN GLARGINE 100 UNITS/ML SOLOSTAR PEN: 38.0000 [IU] | PEN_INJECTOR | Freq: Every day | SUBCUTANEOUS | Status: DC

## 2023-11-12 MED ORDER — INSULIN ASPART 100 UNIT/ML IJ SOLN
0.0000 [IU] | Freq: Three times a day (TID) | INTRAMUSCULAR | Status: DC
  Administered 2023-11-13: 1 [IU] via SUBCUTANEOUS

## 2023-11-12 MED ORDER — HEPARIN BOLUS VIA INFUSION
3000.0000 [IU] | Freq: Once | INTRAVENOUS | Status: AC
  Administered 2023-11-12: 3000 [IU] via INTRAVENOUS
  Filled 2023-11-12: qty 3000

## 2023-11-12 MED ORDER — INSULIN GLARGINE-YFGN 100 UNIT/ML ~~LOC~~ SOLN
38.0000 [IU] | Freq: Every day | SUBCUTANEOUS | Status: DC
  Administered 2023-11-12 (×2): 38 [IU] via SUBCUTANEOUS
  Filled 2023-11-12 (×4): qty 0.38

## 2023-11-12 MED ORDER — APIXABAN (ELIQUIS) VTE STARTER PACK (10MG AND 5MG)
ORAL_TABLET | ORAL | 0 refills | Status: DC
  Filled 2023-11-12: qty 74, 30d supply, fill #0

## 2023-11-12 MED ORDER — PALIPERIDONE ER 6 MG PO TB24
6.0000 mg | ORAL_TABLET | Freq: Every day | ORAL | Status: DC
  Administered 2023-11-12: 6 mg via ORAL
  Filled 2023-11-12 (×2): qty 1

## 2023-11-12 MED ORDER — ACETAMINOPHEN 650 MG RE SUPP: 650.0000 mg | Freq: Four times a day (QID) | RECTAL | Status: DC | PRN

## 2023-11-12 MED ORDER — INSULIN ASPART 100 UNIT/ML IJ SOLN: 0.0000 [IU] | Freq: Every day | INTRAMUSCULAR | Status: DC

## 2023-11-12 MED ORDER — SODIUM CHLORIDE 0.9 % IV SOLN: INTRAVENOUS | Status: AC

## 2023-11-12 MED ORDER — TRAMADOL HCL 50 MG PO TABS
50.0000 mg | ORAL_TABLET | Freq: Four times a day (QID) | ORAL | Status: DC | PRN
  Administered 2023-11-12 – 2023-11-13 (×4): 50 mg via ORAL
  Filled 2023-11-12 (×4): qty 1

## 2023-11-12 MED ORDER — INSULIN ASPART 100 UNIT/ML IJ SOLN
10.0000 [IU] | Freq: Three times a day (TID) | INTRAMUSCULAR | Status: DC
  Administered 2023-11-12 – 2023-11-13 (×5): 10 [IU] via SUBCUTANEOUS

## 2023-11-12 MED ORDER — PREDNISONE 20 MG PO TABS
40.0000 mg | ORAL_TABLET | Freq: Every day | ORAL | Status: DC
  Administered 2023-11-12 – 2023-11-13 (×2): 40 mg via ORAL
  Filled 2023-11-12 (×2): qty 2

## 2023-11-12 MED ORDER — IPRATROPIUM-ALBUTEROL 0.5-2.5 (3) MG/3ML IN SOLN: 3.0000 mL | Freq: Four times a day (QID) | RESPIRATORY_TRACT | Status: DC | PRN

## 2023-11-12 MED ORDER — DOXYCYCLINE HYCLATE 100 MG PO TABS
100.0000 mg | ORAL_TABLET | Freq: Two times a day (BID) | ORAL | Status: DC
  Administered 2023-11-12 – 2023-11-13 (×2): 100 mg via ORAL
  Filled 2023-11-12 (×2): qty 1

## 2023-11-12 MED ORDER — LORATADINE 10 MG PO TABS
10.0000 mg | ORAL_TABLET | Freq: Every day | ORAL | Status: DC
  Administered 2023-11-12 – 2023-11-13 (×2): 10 mg via ORAL
  Filled 2023-11-12 (×2): qty 1

## 2023-11-12 MED ORDER — PREDNISONE 20 MG PO TABS
40.0000 mg | ORAL_TABLET | Freq: Every day | ORAL | 0 refills | Status: AC
  Filled 2023-11-12: qty 6, 3d supply, fill #0

## 2023-11-12 MED ORDER — MELATONIN 5 MG PO TABS
5.0000 mg | ORAL_TABLET | Freq: Every evening | ORAL | Status: DC | PRN
  Administered 2023-11-12: 5 mg via ORAL
  Filled 2023-11-12: qty 1

## 2023-11-12 MED ORDER — NICOTINE 21 MG/24HR TD PT24
21.0000 mg | MEDICATED_PATCH | Freq: Every day | TRANSDERMAL | Status: DC
  Administered 2023-11-12 – 2023-11-13 (×2): 21 mg via TRANSDERMAL
  Filled 2023-11-12 (×2): qty 1

## 2023-11-12 MED ORDER — ACETAMINOPHEN 325 MG PO TABS
650.0000 mg | ORAL_TABLET | Freq: Four times a day (QID) | ORAL | Status: DC | PRN
  Administered 2023-11-13 (×2): 650 mg via ORAL
  Filled 2023-11-12 (×2): qty 2

## 2023-11-12 NOTE — Progress Notes (Signed)
 PHARMACY - ANTICOAGULATION CONSULT NOTE  Pharmacy Consult for heparin  initiation Indication: pulmonary embolus  Allergies  Allergen Reactions   Statins Other (See Comments)    Myopathy   Haldol [Haloperidol] Other (See Comments)    Unk reaction   Metformin  And Related Diarrhea and Nausea And Vomiting   Trazodone  And Nefazodone Itching    Patient Measurements: Height: 5' 9 (175.3 cm) Weight: 115 kg (253 lb 8.5 oz) IBW/kg (Calculated) : 70.7 HEPARIN  DW (KG): 96.4  Vital Signs: Temp: 98.6 F (37 C) (07/19 2045) Temp Source: Oral (07/19 2045) BP: 174/81 (07/19 2045) Pulse Rate: 68 (07/19 1128)  Labs: Recent Labs    11/11/23 2055 11/11/23 2230 11/12/23 0215 11/12/23 0635 11/12/23 1843  HGB 16.1  --  15.2  --   --   HCT 49.1  --  47.2  --   --   PLT 153  --  128*  --   --   HEPARINUNFRC  --   --   --  0.36 <0.10*  CREATININE 1.59*  --  1.42*  --   --   TROPONINIHS 7 6  --   --   --     Estimated Creatinine Clearance: 64.8 mL/min (A) (by C-G formula based on SCr of 1.42 mg/dL (H)).   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  No anticoagulation PTA.  Initial heparin  level 0.36 is therapeutic on 1500 units/hr.  Hgb 15.2, pltc 128 (slightly down).  No s/sx bleeding per RN.  The RN noted this afternoon that the PIV was dislodged and wouldn't draw  back.  Line was replaced and heparin  restarted around 1230.  7/19 PM: Heparin  level undetectable: spoke to RN that reports no issues with infusion this afternoon after IV team place new line. Even with infusion stopped I would expect the level to be detectible after 6 hours of infusing. Will bolus and increase rate.   Goal of Therapy:  Heparin  level 0.3-0.7 units/ml Monitor platelets by anticoagulation protocol: Yes   Plan:  Bolus heparin  IV 3000 units Increase heparin  IV to 1800 units/hr Confirmatory 8h heparin  level Daily heparin  level, CBC F/u transition to PO anticoagulation   Koren Or, PharmD Clinical Pharmacist 11/12/2023 9:00 PM Please check AMION for all [facility] numbers

## 2023-11-12 NOTE — Progress Notes (Addendum)
 PHARMACY - ANTICOAGULATION CONSULT NOTE  Pharmacy Consult for heparin  initiation Indication: pulmonary embolus  Allergies  Allergen Reactions   Statins Other (See Comments)    Myopathy   Haldol [Haloperidol] Other (See Comments)    Unk reaction   Metformin  And Related Diarrhea and Nausea And Vomiting   Trazodone  And Nefazodone Itching    Patient Measurements: Height: 5' 9 (175.3 cm) Weight: 115 kg (253 lb 8.5 oz) IBW/kg (Calculated) : 70.7 HEPARIN  DW (KG): 96.4  Vital Signs: Temp: 97.6 F (36.4 C) (07/19 1128) Temp Source: Oral (07/19 1128) BP: 145/90 (07/19 1128) Pulse Rate: 68 (07/19 1128)  Labs: Recent Labs    11/11/23 2055 11/11/23 2230 11/12/23 0215 11/12/23 0635  HGB 16.1  --  15.2  --   HCT 49.1  --  47.2  --   PLT 153  --  128*  --   HEPARINUNFRC  --   --   --  0.36  CREATININE 1.59*  --  1.42*  --   TROPONINIHS 7 6  --   --     Estimated Creatinine Clearance: 64.8 mL/min (A) (by C-G formula based on SCr of 1.42 mg/dL (H)).   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  No anticoagulation PTA.  Initial heparin  level 0.36 is therapeutic on 1500 units/hr.  Hgb 15.2, pltc 128 (slightly down).  No s/sx bleeding per RN.  The RN noted this afternoon that the PIV was dislodged and wouldn't draw back.  Line was replaced and heparin   restarted around 1230.  Goal of Therapy:  Heparin  level 0.3-0.7 units/ml Monitor platelets by anticoagulation protocol: Yes   Plan:  Continue heparin  IV 1500 units/hr Confirmatory 8h heparin  level Daily heparin  level, CBC F/u transition to PO anticoagulation   Mickeal Daws Paytes, PharmD Clinical Pharmacist 11/12/2023  12:56 PM

## 2023-11-12 NOTE — Progress Notes (Signed)
 PROGRESS NOTE  Joshua [name] FMW:[id number] DOB: Apr 30, 1955 DOA: 11/11/2023 PCP: [name], MD  HPI/Recap of past 24 hours: Joshua [name] is a 68 y.o. male with medical history significant of anxiety, bipolar disorder, schizoaffective disorder, cannabis use disorder, tobacco abuse, chronic pain syndrome, COPD with chronic bronchitis, DM2 with peripheral neuropathy, CKD stage IIIa, HTN, chronic avascular necrosis of left hip, chronic low back pain with left-sided sciatica, migraine headaches presenting with complaints of left lower leg pain and swelling.  Patient is a poor historian. Per wife at bedside, his left leg swelling was noticed a few days ago.  Patient denies history of blood clots in the past.  However, does mention that he either stays in bed or sits most of the time and does not walk much due to chronic left hip pain. In the ED, SBP in the 80s and tachycardic to the 110s on arrival. O2 sats, in the low 90s on room air and placed on 2 L [place]. Labs notable for glucose 216, BUN 30, creatinine 1.6 (baseline 1.3), blood cultures in process, D-dimer 1.17, troponin negative x 2, lactic acid normal x 2. CTA chest showing segmental/subsegmental bilateral lower lobe PE with overall small clot burden and no evidence of right heart strain.  Patient admitted for further management.     Today, patient denies any new complaints, reports he does not ambulate often due to chronic hip pain.  Noted to be wheezing bilaterally. Denies any chest pain, worsening shortness of breath, abdominal pain, nausea/vomiting, fever/chills.    Assessment/Plan: Principal Problem:   Acute pulmonary embolism (HCC) Active Problems:   Schizoaffective disorder, bipolar type (HCC)   Tobacco dependence   COPD (chronic obstructive pulmonary disease) (HCC)   AKI (acute kidney injury) (HCC)   Acute segmental/subsegmental bilateral lower lobe PE Currently on 2 L of O2, plan to wean off CTA chest showing  segmental/subsegmental bilateral lower lobe PE with overall small clot burden and no evidence of right heart strain Echo showed EF of 60 to 65%, no regional wall motion abnormality, grade 1 diastolic dysfunction Lower extremity Doppler pending Patient has limited mobility at baseline due to chronic hip and back pain which is likely contributing. Continue IV heparin  Telemetry, monitor closely   Mild AKI on CKD stage IIIa Baseline appears to be 1.3. S/p IVF Avoid nephrotoxic agents/hold home valsartan  and hydrochlorothiazide  at this time Daily BMP   ??COPD exacerbation Noted to be wheezing BNP pending Start prednisone , azithromycin , DuoNebs Continue Breo (hospital formulary replacement for Advair ), Claritin , DuoNeb as needed Supplemental O2 as needed  Hypertension BP stable Noted to be hypotensive on admission Holding antihypertensives at this time   Diabetes mellitus type 2 Last A1c 8.0 on 08/30/2023 SSI, Accu-Cheks, hypoglycemic protocol Continue home long-acting and scheduled mealtime insulin    Bipolar/schizoaffective disorder Continue Invega .   Tobacco use Smokes about 1.5 packs of cigarettes daily and has been doing so for the past 50 years NicoDerm patch ordered and patient has been counseled to quit.   Incidental pulmonary nodule CT showing a 12 mm irregular right lower lobe nodule Infection less likely with no fever or leukocytosis Given longstanding history of heavy cigarette smoking, patient will need close outpatient follow-up for repeat CT chest in 3 to 6 months as per radiologist recommendation.   Chronic pain syndrome Chronic avascular necrosis of left hip Chronic low back pain with left-sided sciatica Continue gabapentin , Robaxin  PRN, tramadol  PRN PT/OT  GERD Continue PPI  Obesity class II Lifestyle modification advised  Estimated body mass index is 37.44 kg/m as calculated from the following:   Height as of this encounter: 5' 9 (1.753 m).    Weight as of this encounter: 115 kg.     Code Status: Full  Family Communication: None at bedside  Disposition Plan: Status is: Observation The patient will require care spanning > 2 midnights and should be moved to inpatient because: Level of care      Consultants: None  Procedures: None  Antimicrobials: Azithromycin   DVT prophylaxis: IV heparin    Objective: Vitals:   11/12/23 0052 11/12/23 0405 11/12/23 0741 11/12/23 1128  BP: 127/74 106/71  (!) 145/90  Pulse: 86   68  Resp: 20 20 19 17   Temp: 97.8 F (36.6 C) 98.7 F (37.1 C) 98.6 F (37 C) 97.6 F (36.4 C)  TempSrc: Oral Oral Oral Oral  SpO2: 91%     Weight:      Height:       No intake or output data in the 24 hours ending 11/12/23 1457 Filed Weights   11/11/23 2029  Weight: 115 kg    Exam: General: NAD  Cardiovascular: S1, S2 present Respiratory: Noted bilateral wheezing Abdomen: Soft, nontender, nondistended, bowel sounds present Musculoskeletal: bilateral pedal edema noted Skin: Normal Psychiatry: Normal mood   Data Reviewed: CBC: Recent Labs  Lab 11/11/23 2055 11/12/23 0215  WBC 8.1 8.2  NEUTROABS 5.2  --   HGB 16.1 15.2  HCT 49.1 47.2  MCV 94.4 95.2  PLT 153 128*   Basic Metabolic Panel: Recent Labs  Lab 11/11/23 2055 11/12/23 0215  NA 136 136  K 4.5 4.4  CL 99 101  CO2 27 28  GLUCOSE 216* 122*  BUN 30* 27*  CREATININE 1.59* 1.42*  CALCIUM  8.5* 8.6*   GFR: Estimated Creatinine Clearance: 64.8 mL/min (A) (by C-G formula based on SCr of 1.42 mg/dL (H)). Liver Function Tests: Recent Labs  Lab 11/12/23 0215  AST 15  ALT 16  ALKPHOS 80  BILITOT 0.5  PROT 5.8*  ALBUMIN 2.9*   No results for input(s): LIPASE, AMYLASE in the last 168 hours. No results for input(s): AMMONIA in the last 168 hours. Coagulation Profile: No results for input(s): INR, PROTIME in the last 168 hours. Cardiac Enzymes: No results for input(s): CKTOTAL, CKMB, CKMBINDEX,  TROPONINI in the last 168 hours. BNP (last 3 results) No results for input(s): PROBNP in the last 8760 hours. HbA1C: No results for input(s): HGBA1C in the last 72 hours. CBG: Recent Labs  Lab 11/12/23 0132 11/12/23 0739 11/12/23 1125  GLUCAP 134* 102* 110*   Lipid Profile: No results for input(s): CHOL, HDL, LDLCALC, TRIG, CHOLHDL, LDLDIRECT in the last 72 hours. Thyroid  Function Tests: No results for input(s): TSH, T4TOTAL, FREET4, T3FREE, THYROIDAB in the last 72 hours. Anemia Panel: No results for input(s): VITAMINB12, FOLATE, FERRITIN, TIBC, IRON, RETICCTPCT in the last 72 hours. Urine analysis:    Component Value Date/Time   COLORURINE YELLOW 07/13/2021 1357   APPEARANCEUR CLEAR 07/13/2021 1357   LABSPEC 1.011 07/13/2021 1357   PHURINE 6.0 07/13/2021 1357   GLUCOSEU >1,000 (A) 07/13/2021 1357   HGBUR NEGATIVE 07/13/2021 1357   BILIRUBINUR negative 02/11/2022 1234   KETONESUR trace (5) (A) 02/11/2022 1234   KETONESUR NEGATIVE 07/13/2021 1357   PROTEINUR NEGATIVE 07/13/2021 1357   UROBILINOGEN 0.2 02/11/2022 1234   UROBILINOGEN 0.2 09/19/2017 1400   NITRITE Negative 02/11/2022 1234   NITRITE NEGATIVE 07/13/2021 1357   LEUKOCYTESUR Negative 02/11/2022 1234   LEUKOCYTESUR  SMALL (A) 07/13/2021 1357   Sepsis Labs: @LABRCNTIP (procalcitonin:4,lacticidven:4)  ) Recent Results (from the past 240 hours)  Blood culture (routine x 2)     Status: None (Preliminary result)   Collection Time: 11/11/23  8:45 PM   Specimen: BLOOD RIGHT ARM  Result Value Ref Range Status   Specimen Description BLOOD RIGHT ARM  Final   Special Requests   Final    BOTTLES DRAWN AEROBIC AND ANAEROBIC Blood Culture adequate volume   Culture   Final    NO GROWTH < 12 HOURS Performed at [facility], [address], [place], [place] [address]    Report Status PENDING  Incomplete  Blood culture (routine x 2)     Status: None (Preliminary result)    Collection Time: 11/11/23  8:55 PM   Specimen: BLOOD RIGHT ARM  Result Value Ref Range Status   Specimen Description BLOOD RIGHT ARM  Final   Special Requests   Final    BOTTLES DRAWN AEROBIC AND ANAEROBIC Blood Culture adequate volume   Culture   Final    NO GROWTH < 12 HOURS Performed at [facility], [address], [place], [place] [address]    Report Status PENDING  Incomplete      Studies: ECHOCARDIOGRAM COMPLETE Result Date: 11/12/2023    ECHOCARDIOGRAM REPORT   Patient Name:   Joshua [name] [facility] Date of Exam: 11/12/2023 Medical Rec #:  [id number]        Height:       69.0 in Accession #:    [id number]       Weight:       253.5 lb Date of Birth:  1955-06-30       BSA:          2.285 m Patient Age:    67 years         BP:           145/90 mmHg Patient Gender: M                HR:           75 bpm. Exam Location:  Inpatient Procedure: 2D Echo, Cardiac Doppler and Color Doppler (Both Spectral and Color            Flow Doppler were utilized during procedure). Indications:    Pulmonary Embolus I26.09  History:        Patient has no prior history of Echocardiogram examinations.                 Migraine and COPD; Risk Factors:Hypertension, Diabetes and                 Current Smoker.  Sonographer:    Thea Norlander RCS Referring Phys: [id number] VASUNDHRA RATHORE  Sonographer Comments: No parasternal window and suboptimal apical window. IMPRESSIONS  1. Left ventricular ejection fraction, by estimation, is 60 to 65%. The left ventricle has normal function. The left ventricle has no regional wall motion abnormalities. Left ventricular diastolic parameters are consistent with Grade I diastolic dysfunction (impaired relaxation).  2. Right ventricular systolic function is normal. The right ventricular size is normal.  3. The mitral valve is normal in structure. No evidence of mitral valve regurgitation. No evidence of mitral stenosis. Moderate mitral annular calcification.  4. The aortic valve is normal  in structure. Aortic valve regurgitation is not visualized. No aortic stenosis is present.  5. The inferior vena cava is dilated in size with <50% respiratory  variability, suggesting right atrial pressure of 15 mmHg. FINDINGS  Left Ventricle: Left ventricular ejection fraction, by estimation, is 60 to 65%. The left ventricle has normal function. The left ventricle has no regional wall motion abnormalities. The left ventricular internal cavity size was normal in size. There is  no left ventricular hypertrophy. Left ventricular diastolic parameters are consistent with Grade I diastolic dysfunction (impaired relaxation). Right Ventricle: The right ventricular size is normal. No increase in right ventricular wall thickness. Right ventricular systolic function is normal. Left Atrium: Left atrial size was normal in size. Right Atrium: Right atrial size was normal in size. Pericardium: There is no evidence of pericardial effusion. Mitral Valve: The mitral valve is normal in structure. Moderate mitral annular calcification. No evidence of mitral valve regurgitation. No evidence of mitral valve stenosis. Tricuspid Valve: The tricuspid valve is normal in structure. Tricuspid valve regurgitation is not demonstrated. No evidence of tricuspid stenosis. Aortic Valve: The aortic valve is normal in structure. Aortic valve regurgitation is not visualized. No aortic stenosis is present. Aortic valve peak gradient measures 4.9 mmHg. Pulmonic Valve: The pulmonic valve was normal in structure. Pulmonic valve regurgitation is not visualized. No evidence of pulmonic stenosis. Aorta: The aortic root is normal in size and structure. Venous: The inferior vena cava is dilated in size with less than 50% respiratory variability, suggesting right atrial pressure of 15 mmHg. IAS/Shunts: No atrial level shunt detected by color flow Doppler.   Diastology LV e' medial:    7.72 cm/s LV E/e' medial:  6.3 LV e' lateral:   9.25 cm/s LV E/e' lateral: 5.3   RIGHT VENTRICLE             IVC RV S prime:     14.00 cm/s  IVC diam: 2.90 cm TAPSE (M-mode): 1.9 cm LEFT ATRIUM           Index        RIGHT ATRIUM           Index LA Vol (A4C): 39.8 ml 17.42 ml/m  RA Area:     14.60 cm                                    RA Volume:   37.20 ml  16.28 ml/m  AORTIC VALVE AV Vmax:      111.00 cm/s AV Peak Grad: 4.9 mmHg LVOT Vmax:    80.70 cm/s LVOT Vmean:   52.900 cm/s LVOT VTI:     0.158 m MITRAL VALVE MV Area (PHT): 4.06 cm    SHUNTS MV Decel Time: 187 msec    Systemic VTI: 0.16 m MV E velocity: 48.70 cm/s MV A velocity: 69.70 cm/s MV E/A ratio:  0.70 Oneil Parchment MD Electronically signed by Oneil Parchment MD Signature Date/Time: 11/12/2023/2:40:27 PM    Final    CT Angio Chest PE W and/or Wo Contrast Result Date: 11/11/2023 EXAM: CTA of the Chest with contrast for PE 11/11/2023 10:12:34 PM TECHNIQUE: CTA of the chest was performed after the administration of intravenous contrast. Multiplanar reformatted images are provided for review. MIP images are provided for review. Automated exposure control, iterative reconstruction, and/or weight based adjustment of the mA/kV was utilized to reduce the radiation dose to as low as reasonably achievable. COMPARISON: None available. CLINICAL HISTORY: Pulmonary embolism (PE) suspected, high prob. FINDINGS: PULMONARY ARTERIES: Segmental and subsegmental bilateral lower lobe pulmonary emboli are present (images 75, 79,  and 91). Overall clot burden is small. No right heart strain. MEDIASTINUM: Mild thoracic aortic atherosclerosis. Mild coronary atherosclerosis of the LAD. LYMPH NODES: No mediastinal, hilar or axillary lymphadenopathy. LUNGS AND PLEURA: Mild centrilobular and paracentesis changes, upper lung predominant. 12 mm irregular right lower lobe nodule with mild surrounding ground glass opacity (image 102), poorly evaluated due to motion artifact, but favoring mild infection/inflammation. Consider follow up CT chest in 3 to 6 months. No  pleural effusion or pneumothorax. UPPER ABDOMEN: 3.6 cm laminated gallstone ( image 165 ). 5.3 cm unlocular cyst in the pancreatic tail ( image 157 ), poorly evaluated due to motion degredation, but favoring a benign pseudocyst. SOFT TISSUES AND BONES: Degenerative changes of the visualized thoracolumbar spine. IMPRESSION: 1. Segmental/subsegmental bilateral lower lobe pulmonary emboli. Overall clot burden is small. No right heart strain. 2. 12 mm irregular right lower lobe nodule, favoring mild infection/inflammation. Consider follow-up CT chest in 3 to 6 months. 3. Critical Value/emergent results were called by telephone at the time of interpretation on 11/11/2023 at 2222 hrs to provider Dr Mannie, who verbally acknowledged these results. Electronically signed by: Pinkie Pebbles MD 11/11/2023 10:23 PM EDT RP Workstation: [id number]    Scheduled Meds:  fluticasone  furoate-vilanterol  1 puff Inhalation Daily   gabapentin   600 mg Oral TID   insulin  aspart  0-5 Units Subcutaneous QHS   insulin  aspart  0-9 Units Subcutaneous TID WC   insulin  aspart  10 Units Subcutaneous TID WC   insulin  glargine-yfgn  38 Units Subcutaneous QHS   loratadine   10 mg Oral Daily   nicotine   21 mg Transdermal Daily   paliperidone   6 mg Oral QHS   pantoprazole   40 mg Oral Daily    Continuous Infusions:  heparin  1,500 Units/hr (11/11/23 2320)     LOS: 0 days     Lebron JINNY Cage, MD [facility]  If 7PM-7AM, please contact night-coverage [email or web address] 11/12/2023, 2:57 PM

## 2023-11-12 NOTE — H&P (Signed)
 History and Physical    Joshua [name] FMW:[id number] DOB: 1956-03-21 DOA: 11/11/2023  PCP: [name], MD  Patient coming from: Home  Chief Complaint: Left lower leg pain and swelling  HPI: Joshua [name] is a 68 y.o. 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  Patient is a poor historian.  Per wife at bedside, his left leg swelling was noticed a few days ago.  Patient denies history of blood clots in the past.  However, does mention that he either stays in bed or sits most of the time and does not walk much due to chronic left hip pain.  Denies recent travel or surgeries.  He smokes 1.5 packs of cigarettes daily and has been doing so for the past 50 years.  Denies shortness of breath or chest pain.  Denies vomiting, diarrhea, or abdominal pain.  ED Course: SBP in the 80s and tachycardic to the 110s on arrival.  Afebrile.  Oxygen saturation in the low 90s on room air and placed on 2 L [place].  Patient had dopplerable pulses of the left lower extremity.  Labs notable for glucose 216, BUN 30, creatinine 1.6 (baseline 1.3), blood cultures in process, D-dimer 1.17, troponin negative x 2, lactic acid normal x 2.  CT angiogram chest showing segmental/subsegmental bilateral lower lobe PE with overall small clot burden and no evidence of right heart strain.  Patient was given DuoNeb, 1 L IV fluids, and started on IV heparin  infusion.  Hypotension and tachycardia resolved after IV fluids.   Review of Systems:  Review of Systems  All other systems reviewed and are negative.   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 He has a 50 pack-year smoking history. He has never used smokeless tobacco. He reports that he does not currently use alcohol. He reports current drug use. Drug: Marijuana.  Allergies  Allergen Reactions   Statins Other (See Comments)    Myopathy   Haldol [Haloperidol] Other (See Comments)    Unk reaction   Metformin  And Related Diarrhea and Nausea And Vomiting   Trazodone  And Nefazodone Itching    Family History  Problem Relation Age of Onset   Diabetes Mother    Cancer Mother        unsure what type   Heart disease Mother    Hypertension Mother    Dementia Father    Diabetes Brother    Dementia Maternal Grandfather    Cancer Paternal Grandmother 5       breast   Mental illness Neg Hx     Prior to Admission medications  Medication Sig Start Date End Date Taking? Authorizing Provider  albuterol  (PROAIR  HFA) 108 (90 Base) MCG/ACT inhaler Inhale 2 puffs into the lungs every 4 (four) hours as needed for wheezing or shortness of breath. 07/14/23  Yes [name], MD  cetirizine  (ZYRTEC ) 10 MG tablet Take 1 tablet (10 mg total) by mouth daily. 12/07/22  Yes Brien Belvie BRAVO, MD  diclofenac  (VOLTAREN ) 50 MG EC tablet Take 1 tablet (50 mg total) by mouth 2 (two) times daily as needed for moderate pain. Eat before taking the medicine. 10/31/23  Yes [name], MD  fluticasone -salmeterol (ADVAIR )  250-50 MCG/ACT AEPB Inhale 1 puff into the lungs 2 (two) times daily. 10/18/23  Yes [name], MD  gabapentin  (NEURONTIN ) 300 MG capsule Take 2 capsules (600 mg total) by mouth 3 (three) times daily. 08/17/23  Yes [name], MD  insulin  glargine (LANTUS ) 100 unit/mL SOPN Inject 38 Units into the skin at bedtime. 07/20/23  Yes [name], MD  insulin  lispro (HUMALOG  KWIKPEN) 100 UNIT/ML KwikPen Inject 10 Units into the skin 3 (three) times daily. 08/30/23  Yes [name], MD  ipratropium-albuterol  (DUONEB) 0.5-2.5 (3) MG/3ML SOLN Take 3 mLs by nebulization every 6 (six) hours as needed (shortness of breath). 08/30/23  Yes [name], MD  methocarbamol  (ROBAXIN ) 750 MG tablet Take 1 tablet (750 mg total) by mouth every 8 (eight) hours as needed for muscle spasms. 10/11/23  Yes [name], MD  omeprazole  (PRILOSEC) 20 MG capsule Take 1 capsule (20 mg total) by mouth daily. 07/14/23  Yes [name], MD  paliperidone  (INVEGA ) 6 MG 24 hr tablet Take 6 mg by mouth at bedtime. 07/05/22  Yes [provider]  Semaglutide , 2 MG/DOSE, (OZEMPIC , 2 MG/DOSE,) 8 MG/3ML SOPN Inject 2 mg as directed once a week. 09/30/23  Yes [name], MD  SUMAtriptan  (IMITREX ) 50 MG tablet Take 1 tablet by mouth at the start of headache.  May repeat in 2 hours if headache persists. Max 2 tabs/24hr. 10/31/23  Yes [name], MD  traMADol  (ULTRAM ) 50 MG tablet Take 1 tablet (50 mg total) by mouth every 6 (six) hours as needed for moderate pain (pain score 4-6) or severe pain (pain score 7-10). 09/20/23  Yes [name], MD  valsartan -hydrochlorothiazide  (DIOVAN -HCT) 320-25 MG tablet Take 1 tablet by mouth daily. 08/30/23  Yes [name], MD  Accu-Chek FastClix Lancets MISC Use to check blood sugars three times per day 12/07/22   Brien Belvie BRAVO, MD  blood glucose meter kit and supplies KIT Dispense based on patient and insurance preference. Use up to four  times daily as directed. ICD-10 E11.65  Z79.4 07/20/19   Fulp, Cammie, MD  Blood Glucose Monitoring Suppl (ACCU-CHEK GUIDE ME) w/Device KIT Use to check blood sugars up to 3 times per day. ICD-10 E11.65, Z79.4 03/03/20   Alec House, MD  Blood Pressure Monitoring (BLOOD PRESSURE KIT) DEVI Use to measure blood pressure 09/29/21   Brien Belvie BRAVO, MD  glucose blood (ACCU-CHEK GUIDE) test strip Use as instructed to check blood sugars three times per day. 12/07/22   Brien Belvie BRAVO, MD  hydrOXYzine  (ATARAX ) 50 MG tablet Take 1 tablet (50 mg total) by mouth 3 (three) times daily. Patient not taking: Reported on 11/11/2023 12/07/22   Brien Belvie BRAVO, MD  Insulin  Pen Needle (PEN NEEDLES) 32G X 4 MM MISC Use to inject insulin  four times daily. Also, Ozempic  once weekly. 04/26/23  Brien Belvie BRAVO, MD  Lancets Misc. (ACCU-CHEK FASTCLIX LANCET) KIT Use to check blood sugars 3 times per day 03/03/20   Alec House, MD  Respiratory Therapy Supplies (FLUTTER) DEVI Use with 4 times daily Patient not taking: Reported on 04/08/2023 03/27/19   Brien Belvie BRAVO, MD  Spacer/Aero-Holding Chambers (AEROCHAMBER MV) inhaler Use as instructed Patient not taking: Reported on 04/08/2023 03/27/19   Brien Belvie BRAVO, MD    Physical Exam: Vitals:   11/11/23 2140 11/11/23 2245 11/11/23 2300 11/11/23 2332  BP: (!) 96/57 109/67 106/76   Pulse: 83 89 89   Resp: (!) 25 20 (!) 22   Temp:      SpO2: 92% 97% 93% 95%  Weight:      Height:        Physical Exam Vitals reviewed.  Constitutional:      General: He is not in acute distress. HENT:     Head: Normocephalic and atraumatic.  Eyes:     Extraocular Movements: Extraocular movements intact.  Cardiovascular:     Rate and Rhythm: Normal rate and regular rhythm.     Heart sounds: Normal heart sounds.  Pulmonary:     Effort: Pulmonary effort is normal. No respiratory distress.     Breath sounds: No wheezing or rales.     Comments: Diminished breath sounds  bilaterally Abdominal:     General: Bowel sounds are normal. There is no distension.     Palpations: Abdomen is soft.     Tenderness: There is no abdominal tenderness. There is no guarding.  Musculoskeletal:     Cervical back: Normal range of motion.     Right lower leg: No edema.     Left lower leg: Edema present.  Skin:    General: Skin is warm and dry.  Neurological:     General: No focal deficit present.     Mental Status: He is alert and oriented to person, place, and time.     Labs on Admission: I have personally reviewed following labs and imaging studies  CBC: Recent Labs  Lab 11/11/23 2055  WBC 8.1  NEUTROABS 5.2  HGB 16.1  HCT 49.1  MCV 94.4  PLT 153   Basic Metabolic Panel: Recent Labs  Lab 11/11/23 2055  NA 136  K 4.5  CL 99  CO2 27  GLUCOSE 216*  BUN 30*  CREATININE 1.59*  CALCIUM  8.5*   GFR: Estimated Creatinine Clearance: 57.9 mL/min (A) (by C-G formula based on SCr of 1.59 mg/dL (H)). Liver Function Tests: No results for input(s): AST, ALT, ALKPHOS, BILITOT, PROT, ALBUMIN in the last 168 hours. No results for input(s): LIPASE, AMYLASE in the last 168 hours. No results for input(s): AMMONIA in the last 168 hours. Coagulation Profile: No results for input(s): INR, PROTIME in the last 168 hours. Cardiac Enzymes: No results for input(s): CKTOTAL, CKMB, CKMBINDEX, TROPONINI in the last 168 hours. BNP (last 3 results) No results for input(s): PROBNP in the last 8760 hours. HbA1C: No results for input(s): HGBA1C in the last 72 hours. CBG: No results for input(s): GLUCAP in the last 168 hours. Lipid Profile: No results for input(s): CHOL, HDL, LDLCALC, TRIG, CHOLHDL, LDLDIRECT in the last 72 hours. Thyroid  Function Tests: No results for input(s): TSH, T4TOTAL, FREET4, T3FREE, THYROIDAB in the last 72 hours. Anemia Panel: No results for input(s): VITAMINB12, FOLATE, FERRITIN, TIBC,  IRON, RETICCTPCT in the last 72 hours. Urine analysis:    Component Value Date/Time   COLORURINE YELLOW 07/13/2021 1357  APPEARANCEUR CLEAR 07/13/2021 1357   LABSPEC 1.011 07/13/2021 1357   PHURINE 6.0 07/13/2021 1357   GLUCOSEU >1,000 (A) 07/13/2021 1357   HGBUR NEGATIVE 07/13/2021 1357   BILIRUBINUR negative 02/11/2022 1234   KETONESUR trace (5) (A) 02/11/2022 1234   KETONESUR NEGATIVE 07/13/2021 1357   PROTEINUR NEGATIVE 07/13/2021 1357   UROBILINOGEN 0.2 02/11/2022 1234   UROBILINOGEN 0.2 09/19/2017 1400   NITRITE Negative 02/11/2022 1234   NITRITE NEGATIVE 07/13/2021 1357   LEUKOCYTESUR Negative 02/11/2022 1234   LEUKOCYTESUR SMALL (A) 07/13/2021 1357    Radiological Exams on Admission: CT Angio Chest PE W and/or Wo Contrast Result Date: 11/11/2023 EXAM: CTA of the Chest with contrast for PE 11/11/2023 10:12:34 PM TECHNIQUE: CTA of the chest was performed after the administration of intravenous contrast. Multiplanar reformatted images are provided for review. MIP images are provided for review. Automated exposure control, iterative reconstruction, and/or weight based adjustment of the mA/kV was utilized to reduce the radiation dose to as low as reasonably achievable. COMPARISON: None available. CLINICAL HISTORY: Pulmonary embolism (PE) suspected, high prob. FINDINGS: PULMONARY ARTERIES: Segmental and subsegmental bilateral lower lobe pulmonary emboli are present (images 75, 79, and 91). Overall clot burden is small. No right heart strain. MEDIASTINUM: Mild thoracic aortic atherosclerosis. Mild coronary atherosclerosis of the LAD. LYMPH NODES: No mediastinal, hilar or axillary lymphadenopathy. LUNGS AND PLEURA: Mild centrilobular and paracentesis changes, upper lung predominant. 12 mm irregular right lower lobe nodule with mild surrounding ground glass opacity (image 102), poorly evaluated due to motion artifact, but favoring mild infection/inflammation. Consider follow up CT chest  in 3 to 6 months. No pleural effusion or pneumothorax. UPPER ABDOMEN: 3.6 cm laminated gallstone ( image 165 ). 5.3 cm unlocular cyst in the pancreatic tail ( image 157 ), poorly evaluated due to motion degredation, but favoring a benign pseudocyst. SOFT TISSUES AND BONES: Degenerative changes of the visualized thoracolumbar spine. IMPRESSION: 1. Segmental/subsegmental bilateral lower lobe pulmonary emboli. Overall clot burden is small. No right heart strain. 2. 12 mm irregular right lower lobe nodule, favoring mild infection/inflammation. Consider follow-up CT chest in 3 to 6 months. 3. Critical Value/emergent results were called by telephone at the time of interpretation on 11/11/2023 at 2222 hrs to provider Dr Mannie, who verbally acknowledged these results. Electronically signed by: Pinkie Pebbles MD 11/11/2023 10:23 PM EDT RP Workstation: [id number]    EKG: Independently reviewed.  Sinus rhythm.  Assessment and Plan  Acute segmental/subsegmental bilateral lower lobe PE Suspected left lower extremity DVT Patient has limited mobility at baseline due to chronic hip and back pain which is likely contributing.  Hypotension and tachycardia resolved after 1 L IV fluids.  Oxygen saturation in the low 90s on room air in the setting of COPD, currently stable on 2 L [place].  Per CT report, overall clot burden appears small and no evidence of right heart strain.  Troponin and lactate reassuring.  Continue IV heparin .  Echocardiogram ordered.  Left lower extremity Doppler ultrasound ordered.  Mild AKI on CKD stage IIIa Likely prerenal in etiology in the setting of hypotension.  Creatinine currently 1.6 and baseline appears to be 1.3.  Continue IV fluid hydration and monitor renal function.  Avoid nephrotoxic agents/hold home valsartan  and hydrochlorothiazide  at this time.  COPD Continue Breo (hospital formulary replacement for Advair ), Claritin , DuoNeb as needed.  Insulin -dependent type 2 diabetes Last A1c  8.0 on 08/30/2023.  Continue home long-acting and scheduled mealtime insulin .  Placed on sensitive sliding scale insulin  ACHS.  Bipolar/schizoaffective disorder  Continue Invega .  Cigarette smoking NicoDerm patch ordered and patient has been counseled to quit.  Incidental pulmonary nodule CT showing a 12 mm irregular right lower lobe nodule.  Infection less likely with no fever or leukocytosis.  Given longstanding history of heavy cigarette smoking, patient will need close outpatient follow-up for repeat CT chest in 3 to 6 months as per radiologist recommendation.  Chronic pain syndrome Chronic avascular necrosis of left hip Chronic low back pain with left-sided sciatica Continue gabapentin , Robaxin  PRN, tramadol  PRN  Hypertension Holding antihypertensives at this time.  GERD Continue PPI  Code Status: Full Code (discussed with the patient) Family Communication: Wife at bedside. Level of care: Progressive Care Unit Admission status: It is my clinical opinion that referral for OBSERVATION is reasonable and necessary in this patient based on the above information provided. The aforementioned taken together are felt to place the patient at high risk for further clinical deterioration. However, it is anticipated that the patient may be medically stable for discharge from the hospital within 24 to 48 hours.  Editha Ram MD [facility]  If 7PM-7AM, please contact night-coverage [email or web address]  11/12/2023, 12:33 AM

## 2023-11-12 NOTE — Progress Notes (Signed)
 Echocardiogram 2D Echocardiogram has been performed.  Joshua [name] 11/12/2023, 2:08 PM

## 2023-11-13 ENCOUNTER — Observation Stay (HOSPITAL_BASED_OUTPATIENT_CLINIC_OR_DEPARTMENT_OTHER)

## 2023-11-13 ENCOUNTER — Encounter (HOSPITAL_COMMUNITY)

## 2023-11-13 DIAGNOSIS — M7989 Other specified soft tissue disorders: Secondary | ICD-10-CM | POA: Diagnosis not present

## 2023-11-13 DIAGNOSIS — I2699 Other pulmonary embolism without acute cor pulmonale: Secondary | ICD-10-CM | POA: Diagnosis not present

## 2023-11-13 LAB — CBC
HCT: 46 % (ref 39.0–52.0)
Hemoglobin: 15.5 g/dL (ref 13.0–17.0)
MCH: 30.8 pg (ref 26.0–34.0)
MCHC: 33.7 g/dL (ref 30.0–36.0)
MCV: 91.5 fL (ref 80.0–100.0)
Platelets: 127 K/uL — ABNORMAL LOW (ref 150–400)
RBC: 5.03 MIL/uL (ref 4.22–5.81)
RDW: 14.8 % (ref 11.5–15.5)
WBC: 6.7 K/uL (ref 4.0–10.5)
nRBC: 0 % (ref 0.0–0.2)

## 2023-11-13 LAB — BASIC METABOLIC PANEL WITH GFR
Anion gap: 14 (ref 5–15)
BUN: 23 mg/dL (ref 8–23)
CO2: 20 mmol/L — ABNORMAL LOW (ref 22–32)
Calcium: 9.3 mg/dL (ref 8.9–10.3)
Chloride: 100 mmol/L (ref 98–111)
Creatinine, Ser: 1.15 mg/dL (ref 0.61–1.24)
GFR, Estimated: >60 mL/min (ref >60)
Glucose, Bld: 110 mg/dL — ABNORMAL HIGH (ref 70–99)
Potassium: 4.3 mmol/L (ref 3.5–5.1)
Sodium: 134 mmol/L — ABNORMAL LOW (ref 135–145)

## 2023-11-13 LAB — GLUCOSE, CAPILLARY
Glucose-Capillary: 146 mg/dL — ABNORMAL HIGH (ref 70–99)
Glucose-Capillary: 92 mg/dL (ref 70–99)
Glucose-Capillary: 195 mg/dL — ABNORMAL HIGH (ref 70–99)

## 2023-11-13 MED ORDER — APIXABAN 5 MG PO TABS
10.0000 mg | ORAL_TABLET | Freq: Two times a day (BID) | ORAL | Status: DC
  Administered 2023-11-13: 10 mg via ORAL
  Filled 2023-11-13: qty 2

## 2023-11-13 MED ORDER — APIXABAN 5 MG PO TABS: 5.0000 mg | ORAL_TABLET | Freq: Two times a day (BID) | ORAL | Status: DC

## 2023-11-13 NOTE — Care Management (Signed)
    Durable Medical Equipment  (From admission, onward)           Start     Ordered   11/13/23 1432  For home use only DME Walker rolling  Once       Question Answer Comment  Walker: With 5 Inch Wheels   Patient needs a walker to treat with the following condition Weakness      11/13/23 1432           Per therapy recommendations

## 2023-11-13 NOTE — Discharge Instructions (Addendum)
 Information on my medicine - ELIQUIS  (apixaban )  This medication education was reviewed with me or my healthcare representative as part of my discharge preparation.  The pharmacist that spoke with me during my hospital stay was:  Heather CHRISTELLA Rocher, [facility]  Why was Eliquis  prescribed for you? Eliquis  was prescribed to treat blood clots that may have been found in the veins of your legs (deep vein thrombosis) or in your lungs (pulmonary embolism) and to reduce the risk of them occurring again.  What do You need to know about Eliquis  ? The starting dose is 10 mg (two 5 mg tablets) taken TWICE daily for the FIRST SEVEN (7) DAYS, then on 11/20/2023  the dose is reduced to ONE 5 mg tablet taken TWICE daily.  Eliquis  may be taken with or without food.   Try to take the dose about the same time in the morning and in the evening. If you have difficulty swallowing the tablet whole please discuss with your pharmacist how to take the medication safely.  Take Eliquis  exactly as prescribed and DO NOT stop taking Eliquis  without talking to the doctor who prescribed the medication.  Stopping may increase your risk of developing a new blood clot.  Refill your prescription before you run out.  After discharge, you should have regular check-up appointments with your healthcare provider that is prescribing your Eliquis .    What do you do if you miss a dose? If a dose of ELIQUIS  is not taken at the scheduled time, take it as soon as possible on the same day and twice-daily administration should be resumed. The dose should not be doubled to make up for a missed dose.  Important Safety Information A possible side effect of Eliquis  is bleeding. You should call your healthcare provider right away if you experience any of the following: Bleeding from an injury or your nose that does not stop. Unusual colored urine (red or dark brown) or unusual colored stools (red or black). Unusual bruising for unknown reasons. A  serious fall or if you hit your head (even if there is no bleeding).  Some medicines may interact with Eliquis  and might increase your risk of bleeding or clotting while on Eliquis . To help avoid this, consult your healthcare provider or pharmacist prior to using any new prescription or non-prescription medications, including herbals, vitamins, non-steroidal anti-inflammatory drugs (NSAIDs) and supplements.  This website has more information on Eliquis  (apixaban ): [email or web address]

## 2023-11-13 NOTE — Discharge Summary (Signed)
 Physician Discharge Summary   Patient: Joshua [name] MRN: [id number] DOB: [birth year]  Admit date:     11/11/2023  Discharge date: 11/13/23  Discharge Physician: Lebron JINNY Cage   PCP: [name], MD   Recommendations at discharge:   Follow-up with PCP in 1 week  Discharge Diagnoses: Principal Problem:   Acute pulmonary embolism (HCC) Active Problems:   Schizoaffective disorder, bipolar type (HCC)   Tobacco dependence   COPD (chronic obstructive pulmonary disease) (HCC)   AKI (acute kidney injury) [facility])    Hospital Course: Joshua [name] is a 68 y.o. male with medical history significant of anxiety, bipolar disorder, schizoaffective disorder, cannabis use disorder, tobacco abuse, chronic pain syndrome, COPD with chronic bronchitis, DM2 with peripheral neuropathy, CKD stage IIIa, HTN, chronic avascular necrosis of left hip, chronic low back pain with left-sided sciatica, migraine headaches presenting with complaints of left lower leg pain and swelling.  Patient is a poor historian. Per wife at bedside, his left leg swelling was noticed a few days ago.  Patient denies history of blood clots in the past.  However, does mention that he either stays in bed or sits most of the time and does not walk much due to chronic left hip pain. In the ED, SBP in the 80s and tachycardic to the 110s on arrival. O2 sats, in the low 90s on room air and placed on 2 L [place]. Labs notable for glucose 216, BUN 30, creatinine 1.6 (baseline 1.3), blood cultures in process, D-dimer 1.17, troponin negative x 2, lactic acid normal x 2. CTA chest showing segmental/subsegmental bilateral lower lobe PE with overall small clot burden and no evidence of right heart strain.  Patient admitted for further management.     Today, patient's reports feeling much better, was able to ambulate the hallway with a roller walker about 70 feet, sats 90 to 95% on room air.  Patient denies any chest pain, worsening shortness  of breath, fever/chills.  Patient very eager to be discharged home today.  Home health PT arranged.  Discussed extensively with patient and wife about the need to be compliant with Eliquis  and all of his other medications.  Also arranged for home health PT to ensure progression in his mobility.  Patient also has orthopedic follow-up.    Assessment and Plan:  Acute segmental/subsegmental bilateral lower lobe PE Currently on 2 L of O2, plan to wean off CTA chest showing segmental/subsegmental bilateral lower lobe PE with overall small clot burden and no evidence of right heart strain Echo showed EF of 60 to 65%, no regional wall motion abnormality, grade 1 diastolic dysfunction Lower extremity Doppler showed acute DVT in the left common femoral, popliteal, posterior tibial, and peroneal veins. Noted acute intramuscular thrombus in the left gastroc vein  S/p IV heparin -->Switch to p.o. at Eliquis  Follow-up with PCP, ambulatory referral to DVT clinic   Mild AKI on CKD stage IIIa Resolved Baseline appears to be 1.3. S/p IVF   ??COPD exacerbation Noted to be wheezing BNP 62 Continue prednisone , doxycycline , inhaler Supplemental O2 as needed   Hypertension BP stable Resume valsartan -HCTZ   Diabetes mellitus type 2 Last A1c 8.0 on 08/30/2023 Continue home regimen   Bipolar/schizoaffective disorder Continue Invega    Tobacco use Smokes about 1.5 packs of cigarettes daily and has been doing so for the past 50 years Patient has been counseled to quit.   Incidental pulmonary nodule CT showing a 12 mm irregular right lower lobe nodule Infection less likely with  no fever or leukocytosis Given longstanding history of heavy cigarette smoking, patient will need close outpatient follow-up for repeat CT chest in 3 to 6 months as per radiologist recommendation.   Chronic pain syndrome Chronic avascular necrosis of left hip Chronic low back pain with left-sided sciatica Outpatient follow-up  with orthopedics Continue gabapentin , Robaxin  PRN, tramadol  PRN PT/OT-Home health   GERD Continue PPI   Obesity class II Lifestyle modification advised    Consultants: None Procedures performed: None Disposition: Home health Diet recommendation:  Cardiac and Carb modified diet    DISCHARGE MEDICATION: Allergies as of 11/13/2023       Reactions   Statins Other (See Comments)   Myopathy   Haldol [haloperidol] Other (See Comments)   Unk reaction   Metformin  And Related Diarrhea, Nausea And Vomiting   Trazodone  And Nefazodone Itching        Medication List     STOP taking these medications    hydrOXYzine  50 MG tablet Commonly known as: ATARAX        TAKE these medications    Accu-Chek FastClix Lancet Kit Use to check blood sugars 3 times per day   Accu-Chek FastClix Lancets Misc Use to check blood sugars three times per day   Accu-Chek Guide Me w/Device Kit Use to check blood sugars up to 3 times per day. ICD-10 E11.65, Z79.4   Accu-Chek Guide test strip Generic drug: glucose blood Use as instructed to check blood sugars three times per day.   AeroChamber MV inhaler Use as instructed   albuterol  108 (90 Base) MCG/ACT inhaler Commonly known as: ProAir  HFA Inhale 2 puffs into the lungs every 4 (four) hours as needed for wheezing or shortness of breath.   blood glucose meter kit and supplies Kit Dispense based on patient and insurance preference. Use up to four times daily as directed. ICD-10 E11.65  Z79.4   Blood Pressure Kit Devi Use to measure blood pressure   cetirizine  10 MG tablet Commonly known as: ZYRTEC  Take 1 tablet (10 mg total) by mouth daily.   diclofenac  50 MG EC tablet Commonly known as: VOLTAREN  Take 1 tablet (50 mg total) by mouth 2 (two) times daily as needed for moderate pain. Eat before taking the medicine.   doxycycline  100 MG tablet Commonly known as: VIBRA -TABS Take 1 tablet (100 mg total) by mouth every 12 (twelve)  hours for 4 days.   Eliquis  DVT/PE Starter Pack Generic drug: Apixaban  Starter Pack (10mg  and 5mg ) Take as directed on package: start with two-5mg  tablets twice daily for 7 days. On day 8, switch to one-5mg  tablet twice daily.   fluticasone -salmeterol 250-50 MCG/ACT Aepb Commonly known as: ADVAIR  Inhale 1 puff into the lungs 2 (two) times daily.   Flutter Devi Use with 4 times daily   gabapentin  300 MG capsule Commonly known as: NEURONTIN  Take 2 capsules (600 mg total) by mouth 3 (three) times daily.   insulin  lispro 100 UNIT/ML KwikPen Commonly known as: HumaLOG  KwikPen Inject 10 Units into the skin 3 (three) times daily.   Insupen Pen Needles 32G X 4 MM Misc Generic drug: Insulin  Pen Needle Use to inject insulin  four times daily. Also, Ozempic  once weekly.   ipratropium-albuterol  0.5-2.5 (3) MG/3ML Soln Commonly known as: DUONEB Take 3 mLs by nebulization every 6 (six) hours as needed (shortness of breath).   Lantus  SoloStar 100 UNIT/ML Solostar Pen Generic drug: insulin  glargine Inject 38 Units into the skin at bedtime.   methocarbamol  750 MG tablet Commonly known as:  ROBAXIN  Take 1 tablet (750 mg total) by mouth every 8 (eight) hours as needed for muscle spasms.   omeprazole  20 MG capsule Commonly known as: PRILOSEC Take 1 capsule (20 mg total) by mouth daily.   Ozempic  (2 MG/DOSE) 8 MG/3ML Sopn Generic drug: Semaglutide  (2 MG/DOSE) Inject 2 mg as directed once a week.   paliperidone  6 MG 24 hr tablet Commonly known as: INVEGA  Take 6 mg by mouth at bedtime.   predniSONE  20 MG tablet Commonly known as: DELTASONE  Take 2 tablets (40 mg total) by mouth daily with breakfast for 3 days.   SUMAtriptan  50 MG tablet Commonly known as: Imitrex  Take 1 tablet by mouth at the start of headache.  May repeat in 2 hours if headache persists. Max 2 tabs/24hr.   traMADol  50 MG tablet Commonly known as: ULTRAM  Take 1 tablet (50 mg total) by mouth every 6 (six) hours as  needed for moderate pain (pain score 4-6) or severe pain (pain score 7-10).   valsartan -hydrochlorothiazide  320-25 MG tablet Commonly known as: DIOVAN -HCT Take 1 tablet by mouth daily.               Durable Medical Equipment  (From admission, onward)           Start     Ordered   11/13/23 1435  For home use only DME Bedside commode  Once       Question:  Patient needs a bedside commode to treat with the following condition  Answer:  Weakness   11/13/23 1434   11/13/23 1432  For home use only DME Walker rolling  Once       Question Answer Comment  Walker: With 5 Inch Wheels   Patient needs a walker to treat with the following condition Weakness      11/13/23 1432            Follow-up Information     [facility].   Specialty: Home Health Services Why: Physical therapy. Office will call to arrange follow up after hospital discharge. Contact information: [address] [place] [address] [phone]         [name], MD. Schedule an appointment as soon as possible for a visit in 1 week(s).   Specialty: Internal Medicine Contact information: [address] [place] [address] [phone]                Discharge Exam: Fredricka Weights   11/11/23 2029  Weight: 115 kg   General: NAD  Cardiovascular: S1, S2 present Respiratory: CTAB Abdomen: Soft, nontender, nondistended, bowel sounds present Musculoskeletal: No bilateral pedal edema noted Skin: Normal Psychiatry: Normal mood   Condition at discharge: stable  The results of significant diagnostics from this hospitalization (including imaging, microbiology, ancillary and laboratory) are listed below for reference.   Imaging Studies: ECHOCARDIOGRAM COMPLETE Result Date: 11/12/2023    ECHOCARDIOGRAM REPORT   Patient Name:   Joshua [name] [facility] Date of Exam: 11/12/2023 Medical Rec #:  [id number]        Height:       69.0 in  Accession #:    [id number]       Weight:       253.5 lb Date of Birth:  09/21/1955       BSA:          2.285 m Patient Age:    67 years         BP:  145/90 mmHg Patient Gender: M                HR:           75 bpm. Exam Location:  Inpatient Procedure: 2D Echo, Cardiac Doppler and Color Doppler (Both Spectral and Color            Flow Doppler were utilized during procedure). Indications:    Pulmonary Embolus I26.09  History:        Patient has no prior history of Echocardiogram examinations.                 Migraine and COPD; Risk Factors:Hypertension, Diabetes and                 Current Smoker.  Sonographer:    Thea Norlander RCS Referring Phys: [id number] VASUNDHRA RATHORE  Sonographer Comments: No parasternal window and suboptimal apical window. IMPRESSIONS  1. Left ventricular ejection fraction, by estimation, is 60 to 65%. The left ventricle has normal function. The left ventricle has no regional wall motion abnormalities. Left ventricular diastolic parameters are consistent with Grade I diastolic dysfunction (impaired relaxation).  2. Right ventricular systolic function is normal. The right ventricular size is normal.  3. The mitral valve is normal in structure. No evidence of mitral valve regurgitation. No evidence of mitral stenosis. Moderate mitral annular calcification.  4. The aortic valve is normal in structure. Aortic valve regurgitation is not visualized. No aortic stenosis is present.  5. The inferior vena cava is dilated in size with <50% respiratory variability, suggesting right atrial pressure of 15 mmHg. FINDINGS  Left Ventricle: Left ventricular ejection fraction, by estimation, is 60 to 65%. The left ventricle has normal function. The left ventricle has no regional wall motion abnormalities. The left ventricular internal cavity size was normal in size. There is  no left ventricular hypertrophy. Left ventricular diastolic parameters are consistent with Grade I diastolic dysfunction  (impaired relaxation). Right Ventricle: The right ventricular size is normal. No increase in right ventricular wall thickness. Right ventricular systolic function is normal. Left Atrium: Left atrial size was normal in size. Right Atrium: Right atrial size was normal in size. Pericardium: There is no evidence of pericardial effusion. Mitral Valve: The mitral valve is normal in structure. Moderate mitral annular calcification. No evidence of mitral valve regurgitation. No evidence of mitral valve stenosis. Tricuspid Valve: The tricuspid valve is normal in structure. Tricuspid valve regurgitation is not demonstrated. No evidence of tricuspid stenosis. Aortic Valve: The aortic valve is normal in structure. Aortic valve regurgitation is not visualized. No aortic stenosis is present. Aortic valve peak gradient measures 4.9 mmHg. Pulmonic Valve: The pulmonic valve was normal in structure. Pulmonic valve regurgitation is not visualized. No evidence of pulmonic stenosis. Aorta: The aortic root is normal in size and structure. Venous: The inferior vena cava is dilated in size with less than 50% respiratory variability, suggesting right atrial pressure of 15 mmHg. IAS/Shunts: No atrial level shunt detected by color flow Doppler.   Diastology LV e' medial:    7.72 cm/s LV E/e' medial:  6.3 LV e' lateral:   9.25 cm/s LV E/e' lateral: 5.3  RIGHT VENTRICLE             IVC RV S prime:     14.00 cm/s  IVC diam: 2.90 cm TAPSE (M-mode): 1.9 cm LEFT ATRIUM           Index        RIGHT ATRIUM  Index LA Vol (A4C): 39.8 ml 17.42 ml/m  RA Area:     14.60 cm                                    RA Volume:   37.20 ml  16.28 ml/m  AORTIC VALVE AV Vmax:      111.00 cm/s AV Peak Grad: 4.9 mmHg LVOT Vmax:    80.70 cm/s LVOT Vmean:   52.900 cm/s LVOT VTI:     0.158 m MITRAL VALVE MV Area (PHT): 4.06 cm    SHUNTS MV Decel Time: 187 msec    Systemic VTI: 0.16 m MV E velocity: 48.70 cm/s MV A velocity: 69.70 cm/s MV E/A ratio:  0.70 Oneil Parchment MD Electronically signed by Oneil Parchment MD Signature Date/Time: 11/12/2023/2:40:27 PM    Final    CT Angio Chest PE W and/or Wo Contrast Result Date: 11/11/2023 EXAM: CTA of the Chest with contrast for PE 11/11/2023 10:12:34 PM TECHNIQUE: CTA of the chest was performed after the administration of intravenous contrast. Multiplanar reformatted images are provided for review. MIP images are provided for review. Automated exposure control, iterative reconstruction, and/or weight based adjustment of the mA/kV was utilized to reduce the radiation dose to as low as reasonably achievable. COMPARISON: None available. CLINICAL HISTORY: Pulmonary embolism (PE) suspected, high prob. FINDINGS: PULMONARY ARTERIES: Segmental and subsegmental bilateral lower lobe pulmonary emboli are present (images 75, 79, and 91). Overall clot burden is small. No right heart strain. MEDIASTINUM: Mild thoracic aortic atherosclerosis. Mild coronary atherosclerosis of the LAD. LYMPH NODES: No mediastinal, hilar or axillary lymphadenopathy. LUNGS AND PLEURA: Mild centrilobular and paracentesis changes, upper lung predominant. 12 mm irregular right lower lobe nodule with mild surrounding ground glass opacity (image 102), poorly evaluated due to motion artifact, but favoring mild infection/inflammation. Consider follow up CT chest in 3 to 6 months. No pleural effusion or pneumothorax. UPPER ABDOMEN: 3.6 cm laminated gallstone ( image 165 ). 5.3 cm unlocular cyst in the pancreatic tail ( image 157 ), poorly evaluated due to motion degredation, but favoring a benign pseudocyst. SOFT TISSUES AND BONES: Degenerative changes of the visualized thoracolumbar spine. IMPRESSION: 1. Segmental/subsegmental bilateral lower lobe pulmonary emboli. Overall clot burden is small. No right heart strain. 2. 12 mm irregular right lower lobe nodule, favoring mild infection/inflammation. Consider follow-up CT chest in 3 to 6 months. 3. Critical Value/emergent  results were called by telephone at the time of interpretation on 11/11/2023 at 2222 hrs to provider Dr Mannie, who verbally acknowledged these results. Electronically signed by: Pinkie Pebbles MD 11/11/2023 10:23 PM EDT RP Workstation: [id number]   DG Knee Complete 4 Views Left Result Date: 10/24/2023 CLINICAL DATA:  Chronic LEFT hip and knee pain. Avascular necrosis of the LEFT hip. EXAM: LEFT KNEE - COMPLETE 4+ VIEW; DG HIP (WITH OR WITHOUT PELVIS) 2-3V LEFT COMPARISON:  Lumbar spine radiographs 01/18/2020 FINDINGS: Pelvis/LEFT hip: Near complete collapse of the LEFT femoral head consistent with avascular necrosis. Severe joint space loss, subchondral sclerosis, and subchondral cystic changes of the LEFT hip. These changes have all significantly progressed since prior radiograph from 01/18/2020. Mild RIGHT hip osteoarthrosis. Advanced degenerative changes of the visualized lower lumbar spine. LEFT knee: Mild spurring of the patellofemoral compartment. Mild spurring of the medial and lateral compartments. No fracture or dislocation. IMPRESSION: 1. Near complete collapse of the LEFT femoral head consistent with avascular necrosis. 2. Mild degenerative changes  of the LEFT knee. Electronically Signed   By: Aliene Lloyd M.D.   On: 10/24/2023 17:24   DG Hip Unilat W OR W/O Pelvis 2-3 Views Left Result Date: 10/24/2023 CLINICAL DATA:  Chronic LEFT hip and knee pain. Avascular necrosis of the LEFT hip. EXAM: LEFT KNEE - COMPLETE 4+ VIEW; DG HIP (WITH OR WITHOUT PELVIS) 2-3V LEFT COMPARISON:  Lumbar spine radiographs 01/18/2020 FINDINGS: Pelvis/LEFT hip: Near complete collapse of the LEFT femoral head consistent with avascular necrosis. Severe joint space loss, subchondral sclerosis, and subchondral cystic changes of the LEFT hip. These changes have all significantly progressed since prior radiograph from 01/18/2020. Mild RIGHT hip osteoarthrosis. Advanced degenerative changes of the visualized lower lumbar spine.  LEFT knee: Mild spurring of the patellofemoral compartment. Mild spurring of the medial and lateral compartments. No fracture or dislocation. IMPRESSION: 1. Near complete collapse of the LEFT femoral head consistent with avascular necrosis. 2. Mild degenerative changes of the LEFT knee. Electronically Signed   By: Aliene Lloyd M.D.   On: 10/24/2023 17:24    Microbiology: Results for orders placed or performed during the hospital encounter of 11/11/23  Blood culture (routine x 2)     Status: None (Preliminary result)   Collection Time: 11/11/23  8:45 PM   Specimen: BLOOD RIGHT ARM  Result Value Ref Range Status   Specimen Description BLOOD RIGHT ARM  Final   Special Requests   Final    BOTTLES DRAWN AEROBIC AND ANAEROBIC Blood Culture adequate volume   Culture   Final    NO GROWTH 2 DAYS Performed at [facility], [address], [place], [place] [address]    Report Status PENDING  Incomplete  Blood culture (routine x 2)     Status: None (Preliminary result)   Collection Time: 11/11/23  8:55 PM   Specimen: BLOOD RIGHT ARM  Result Value Ref Range Status   Specimen Description BLOOD RIGHT ARM  Final   Special Requests   Final    BOTTLES DRAWN AEROBIC AND ANAEROBIC Blood Culture adequate volume   Culture   Final    NO GROWTH 2 DAYS Performed at [facility], [address], [place], [place] [address]    Report Status PENDING  Incomplete    Labs: CBC: Recent Labs  Lab 11/11/23 2055 11/12/23 0215 11/13/23 1100  WBC 8.1 8.2 6.7  NEUTROABS 5.2  --   --   HGB 16.1 15.2 15.5  HCT 49.1 47.2 46.0  MCV 94.4 95.2 91.5  PLT 153 128* 127*   Basic Metabolic Panel: Recent Labs  Lab 11/11/23 2055 11/12/23 0215 11/13/23 1100  NA 136 136 134*  K 4.5 4.4 4.3  CL 99 101 100  CO2 27 28 20*  GLUCOSE 216* 122* 110*  BUN 30* 27* 23  CREATININE 1.59* 1.42* 1.15  CALCIUM  8.5* 8.6* 9.3   Liver Function Tests: Recent Labs  Lab 11/12/23 0215  AST 15  ALT 16  ALKPHOS 80   BILITOT 0.5  PROT 5.8*  ALBUMIN 2.9*   CBG: Recent Labs  Lab 11/12/23 1606 11/12/23 1759 11/12/23 2115 11/13/23 0802 11/13/23 1300  GLUCAP 61* 128* 154* 146* 92    Discharge time spent: less than 30 minutes.  Signed: Lebron JINNY Cage, MD [facility] 11/13/2023

## 2023-11-13 NOTE — Evaluation (Signed)
 Occupational Therapy Evaluation Patient Details Name: Joshua [name] MRN: [id number] DOB: Sep 13, 1955 Today's Date: 11/13/2023   History of Present Illness   Pt is a 68 y.o. M who presents 11/11/2023 with complaints of LLE pain and swelling. CT angiogram chest showing segmental/subsegmental bilateral lower lobe PE with overall small clot burden and no evidence of right heart strain. 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     Clinical Impressions Pt admitted for above, PTA pt reports having assist from spouse with LB ADLs and being mod I using SPC to ambulate but increased challenges with L hip pain. Pt currently presenting with impaired balance from L hip deficits, needs UE support from RW to stabilize but can ambulate with Supervision using it. He denies SOB and needs mod A to setup A for ADLs, more assist needed with LB ADLs similar to his baseline. He does note trouble with toileting, limited ambulatory tolerance and benefits from push off to rise into standing, placed recommendations for 3n1. OT to continue following pt acutely to address listed deficits and progress as able. No post acute OT needs, strongly recommend PT follow-up post acutely.      If plan is discharge home, recommend the following:   A little help with bathing/dressing/bathroom;Assistance with cooking/housework     Functional Status Assessment   Patient has had a recent decline in their functional status and demonstrates the ability to make significant improvements in function in a reasonable and predictable amount of time.     Equipment Recommendations   BSC/3in1     Recommendations for  Other Services         Precautions/Restrictions   Precautions Precautions: Fall Recall of Precautions/Restrictions: Intact Restrictions Weight Bearing Restrictions Per Provider Order: No     Mobility Bed Mobility Overal bed mobility: Modified Independent                  Transfers Overall transfer level: Needs assistance Equipment used: Rolling walker (2 wheels) Transfers: Sit to/from Stand Sit to Stand: Supervision                  Balance Overall balance assessment: Needs assistance   Sitting balance-Leahy Scale: Good     Standing balance support: Bilateral upper extremity supported Standing balance-Leahy Scale: Poor Standing balance comment: reliant on RW                           ADL either performed or assessed with clinical judgement   ADL Overall ADL's : Needs assistance/impaired Eating/Feeding: Independent;Sitting   Grooming: Set up;Sitting   Upper Body Bathing: Sitting;Set up   Lower Body Bathing: Sitting/lateral leans;Minimal assistance   Upper Body Dressing : Sitting;Set up   Lower Body Dressing: Sit to/from stand;Moderate assistance;Sitting/lateral leans   Toilet Transfer: Supervision/safety;Rolling walker (2 wheels)   Toileting- Clothing Manipulation and Hygiene: Sitting/lateral lean;Modified independent       Functional mobility during ADLs: Supervision/safety;Rolling walker (2 wheels) General ADL Comments: Pt reports having to use UE support + wall to get off toilet at home with his hip pain, discussed use of BSC. Pt with questions about the therapy process following hip replacement, explained pt that everyone is different and there are many approaches that will  determine any restrictions but it would be ideal to be able to progress with OOB within the first couple of days post op. Pt also with questions on the severity of PE, explained to pt the risks associate with clots and that he made the right decision by coming  to the ED.     Vision   Vision Assessment?: No apparent visual deficits     Perception         Praxis         Pertinent Vitals/Pain Pain Assessment Pain Assessment: Faces Faces Pain Scale: Hurts little more Pain Location: L hip Pain Descriptors / Indicators: Constant, Discomfort, Grimacing, Guarding Pain Intervention(s): Monitored during session, Limited activity within patient's tolerance     Extremity/Trunk Assessment Upper Extremity Assessment Upper Extremity Assessment: Overall WFL for tasks assessed   Lower Extremity Assessment LLE Deficits / Details: Hx of AVN. Grossly weak, able to perform partial LAQ sitting edge of bed. Ankle dorsiflexion [facility]       Communication Communication Communication: No apparent difficulties   Cognition Arousal: Alert Behavior During Therapy: WFL for tasks assessed/performed Cognition: No apparent impairments                               Following commands: Intact       Cueing  General Comments   Cueing Techniques: Verbal cues  VSS RA   Exercises     Shoulder Instructions      Home Living Family/patient expects to be discharged to:: Private residence Living Arrangements: Spouse/significant other Available Help at Discharge: Family (daughter lives beside him, also has 4 grandsons available to help) Type of Home: House Home Access: Stairs to enter Secretary/administrator of Steps: 4 Entrance Stairs-Rails: None Home Layout: One level     Bathroom Shower/Tub: Tub/shower unit         Home Equipment: Production assistant, radio - single point          Prior Functioning/Environment Prior Level of Function : Needs assist             Mobility Comments: using cane, has difficulty ambulating longer distances due to painful L hip (pending potential THA) ADLs Comments: pt spouse helps with lower body dressing    OT Problem List: Pain;Impaired balance (sitting and/or standing)   OT Treatment/Interventions:  Therapeutic exercise;Patient/family education;Balance training;Therapeutic activities;DME and/or AE instruction;Self-care/ADL training      OT Goals(Current goals can be found in the care plan section)   Acute Rehab OT Goals Patient Stated Goal: To go home OT Goal Formulation: With patient Time For Goal Achievement: 11/27/23 Potential to Achieve Goals: Good ADL Goals Pt Will Perform Grooming: with modified independence;standing Pt Will Perform Lower Body Bathing: with set-up;with adaptive equipment;sitting/lateral leans Pt Will Perform Lower Body Dressing: sit to/from stand;with supervision;with adaptive equipment;sitting/lateral leans Pt Will Transfer to Toilet: with modified independence;ambulating;bedside commode   OT Frequency:  Min 2X/week    Co-evaluation              AM-PAC OT 6 Clicks Daily Activity     Outcome Measure Help from another person eating meals?: None Help from another person taking care of personal grooming?: A Little Help from another person toileting, which includes using toliet, bedpan, or urinal?: A Little Help from another person bathing (including washing, rinsing, drying)?: A Little Help from another person to put on and taking off regular upper body clothing?: A Little Help from another person to  put on and taking off regular lower body clothing?: A Lot 6 Click Score: 18   End of Session Equipment Utilized During Treatment: Gait belt;Rolling walker (2 wheels) Nurse Communication: Mobility status  Activity Tolerance: Patient tolerated treatment well Patient left: in bed;with call bell/phone within reach;with family/visitor present  OT Visit Diagnosis: Unsteadiness on feet (R26.81);Other abnormalities of gait and mobility (R26.89);Pain Pain - Right/Left: Left Pain - part of body: Hip                Time: 8576-8554 OT Time Calculation (min): 22 min Charges:  OT General Charges $OT Visit: 1 Visit OT Evaluation $OT Eval Low Complexity: 1  Low  11/13/2023  AB, OTR/L  Acute Rehabilitation Services  Office: [phone]   Curtistine JONETTA Das 11/13/2023, 4:01 PM

## 2023-11-13 NOTE — Evaluation (Signed)
 Physical Therapy Evaluation Patient Details Name: Joshua [name] MRN: [id number] DOB: Mar 16, 1956 Today's Date: 11/13/2023  History of Present Illness  Pt is a 68 y.o. M who presents 11/11/2023 with complaints of LLE pain and swelling. CT angiogram chest showing segmental/subsegmental bilateral lower lobe PE with overall small clot burden and no evidence of right heart strain. 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  Clinical Impression  PTA, pt lives with his spouse, is a household ambulator using a cane and requires assist for lower body ADL's. Pt presents with L hip pain (chronic), LLE weakness, and decreased cardiopulmonary endurance. Pt ambulating 70 ft with a RW, utilizing a step to pattern. Able to verbalize need to take standing rest break. SpO2 90-95% on RA, HR 87-105 bpm. Would benefit from follow up HHPT to address deficits and maximize functional mobility.         If plan is discharge home, recommend the following: A little help with bathing/dressing/bathroom;Assistance with cooking/housework;Assist for transportation;Help with stairs or ramp for entrance   Can travel by private vehicle        Equipment Recommendations Rolling walker (2 wheels)  Recommendations for Other Services       Functional Status Assessment Patient has had a recent decline in their functional status and demonstrates the ability to make significant improvements in function in a reasonable and predictable amount of time.     Precautions / Restrictions Precautions Precautions: Fall Restrictions Weight Bearing Restrictions Per Provider Order: No      Mobility  Bed Mobility Overal bed  mobility: Modified Independent                  Transfers Overall transfer level: Needs assistance Equipment used: Rolling walker (2 wheels) Transfers: Sit to/from Stand Sit to Stand: Supervision                Ambulation/Gait Ambulation/Gait assistance: Supervision Gait Distance (Feet): 70 Feet Assistive device: Rolling walker (2 wheels) Gait Pattern/deviations: Step-to pattern, Decreased stance time - left, Decreased weight shift to left, Antalgic Gait velocity: decreased Gait velocity interpretation: <1.8 ft/sec, indicate of risk for recurrent falls   General Gait Details: Antalgic, step to gait pattern. Required one standing rest break.  Stairs            Wheelchair Mobility     Tilt Bed    Modified Rankin (Stroke Patients Only)       Balance Overall balance assessment: Needs assistance Sitting-balance support: Feet supported Sitting balance-Leahy Scale: Good Sitting balance - Comments: Eating breakfast sitting EOB   Standing balance support: Bilateral upper extremity supported Standing balance-Leahy Scale: Poor Standing balance comment: reliant on RW                             Pertinent Vitals/Pain Pain Assessment Pain Assessment: Faces Faces Pain Scale: Hurts little more Pain Location: L hip Pain Descriptors / Indicators: Constant, Discomfort, Grimacing, Guarding Pain Intervention(s): Monitored during session, Limited activity within patient's tolerance    Home Living Family/patient expects to be discharged to:: Private residence Living Arrangements: Spouse/significant other Available Help at Discharge: Family (daughter lives beside him, also has 4 grandsons available to help) Type of Home: House Home Access: Stairs to  enter Entrance Stairs-Rails: None Entrance Stairs-Number of Steps: 4   Home Layout: One level Home Equipment: Shower seat;Cane - single point      Prior Function Prior Level of Function : Needs  assist             Mobility Comments: using cane, has difficulty ambulating longer distances due to painful L hip (pending potential THA) ADLs Comments: pt spouse helps with lower body dressing     Extremity/Trunk Assessment   Upper Extremity Assessment Upper Extremity Assessment: Defer to OT evaluation    Lower Extremity Assessment Lower Extremity Assessment: LLE deficits/detail LLE Deficits / Details: Hx of AVN. Grossly weak, able to perform partial LAQ sitting edge of bed. Ankle dorsiflexion [facility]       Communication   Communication Communication: No apparent difficulties    Cognition Arousal: Alert Behavior During Therapy: WFL for tasks assessed/performed   PT - Cognitive impairments: No apparent impairments                         Following commands: Intact       Cueing Cueing Techniques: Verbal cues     General Comments      Exercises     Assessment/Plan    PT Assessment Patient needs continued PT services  PT Problem List Decreased strength;Decreased activity tolerance;Decreased balance;Decreased mobility;Cardiopulmonary status limiting activity;Pain       PT Treatment Interventions DME instruction;Gait training;Stair training;Therapeutic activities;Therapeutic exercise;Functional mobility training;Balance training;Patient/family education    PT Goals (Current goals can be found in the Care Plan section)  Acute Rehab PT Goals Patient Stated Goal: go home PT Goal Formulation: With patient Time For Goal Achievement: 11/27/23 Potential to Achieve Goals: Good    Frequency Min 2X/week     Co-evaluation               AM-PAC PT 6 Clicks Mobility  Outcome Measure Help needed turning from your back to your side while in a flat bed without using bedrails?: None Help needed moving from lying on your back to sitting on the side of a flat bed without using bedrails?: None Help needed moving to and from a bed to a chair (including a  wheelchair)?: A Little Help needed standing up from a chair using your arms (e.g., wheelchair or bedside chair)?: A Little Help needed to walk in hospital room?: A Little Help needed climbing 3-5 steps with a railing? : A Lot 6 Click Score: 19    End of Session   Activity Tolerance: Patient tolerated treatment well Patient left: in bed;with call bell/phone within reach Nurse Communication: Mobility status PT Visit Diagnosis: Unsteadiness on feet (R26.81);Other abnormalities of gait and mobility (R26.89);Pain Pain - Right/Left: Left Pain - part of body: Hip    Time: 0921-0935 PT Time Calculation (min) (ACUTE ONLY): 14 min   Charges:   PT Evaluation $PT Eval Low Complexity: 1 Low   PT General Charges $$ ACUTE PT VISIT: 1 Visit         Joshua [name], PT, DPT Acute Rehabilitation Services Office [phone]   Joshua [name] 11/13/2023, 10:26 AM

## 2023-11-13 NOTE — TOC Transition Note (Addendum)
 Transition of Care [facility]) - Discharge Note   Patient Details  Name: ANTON CHERAMIE MRN: [id number] Date of Birth: 11-03-1955  [facility] CM/SW Contact:  Robynn Eileen Hoose, RN Phone Number: 11/13/2023, 2:27 PM   Clinical Narrative:   Patient is being discharged today. HH PT arranged through Angie with [facility]. Contact information on AVS. Message to Jermaine with [employer] for rolling walker to be delivered to bedside.  1433: Secure message from OT regarding patient needing bedside commode. BSC ordered through Jermaine with [employer].    Final next level of care: Home w Home Health Services Barriers to Discharge: No Barriers Identified   Patient Goals and CMS Choice            Discharge Placement                       Discharge Plan and Services Additional resources added to the After Visit Summary for                            [facility] Arranged: PT HH Agency: [facility] Date [facility] Agency Contacted: 11/13/23 Time HH Agency Contacted: 1351 Representative spoke with at [facility] Agency: Angie  Social Drivers of Health (SDOH) Interventions SDOH Screenings   Food Insecurity: No Food Insecurity (11/12/2023)  Housing: Unknown (11/12/2023)  Transportation Needs: No Transportation Needs (11/12/2023)  Utilities: Not At Risk (11/12/2023)  Alcohol Screen: Low Risk  (09/29/2022)  Depression (PHQ2-9): Low Risk  (09/30/2023)  Financial Resource Strain: Medium Risk (04/08/2023)  Physical Activity: Unknown (04/08/2023)  Recent Concern: Physical Activity - Inactive (04/08/2023)  Social Connections: Moderately Isolated (11/12/2023)  Stress: No Stress Concern Present (04/08/2023)  Tobacco Use: High Risk (09/30/2023)     Readmission Risk Interventions     No data to display

## 2023-11-13 NOTE — Care Management (Signed)
    Durable Medical Equipment  (From admission, onward)           Start     Ordered   11/13/23 1435  For home use only DME Bedside commode  Once       Question:  Patient needs a bedside commode to treat with the following condition  Answer:  Weakness   11/13/23 1434   11/13/23 1432  For home use only DME Walker rolling  Once       Question Answer Comment  Walker: With 5 Inch Wheels   Patient needs a walker to treat with the following condition Weakness      11/13/23 1432           Per therapy recommendations

## 2023-11-13 NOTE — Progress Notes (Signed)
 VASCULAR LAB    Left lower extremity venous duplex has been performed.  See CV proc for preliminary results.  Relayed results to Dr. Donnamarie and Roselie Quale, RN  RACHEL, [facility], RVT 11/13/2023, 3:50 PM

## 2023-11-13 NOTE — Progress Notes (Signed)
 PHARMACY - ANTICOAGULATION CONSULT NOTE  Pharmacy Consult for heparin  > new start apixaban  on 7/20 Indication: pulmonary embolus  Allergies  Allergen Reactions   Statins Other (See Comments)    Myopathy   Haldol [Haloperidol] Other (See Comments)    Unk reaction   Metformin  And Related Diarrhea and Nausea And Vomiting   Trazodone  And Nefazodone Itching    Patient Measurements: Height: 5' 9 (175.3 cm) Weight: 115 kg (253 lb 8.5 oz) IBW/kg (Calculated) : 70.7 HEPARIN  DW (KG): 96.4  Vital Signs: Temp: 98.5 F (36.9 C) (07/20 0300) Temp Source: Oral (07/20 0300) BP: 152/88 (07/20 0300)  Labs: Recent Labs    11/11/23 2055 11/11/23 2230 11/12/23 0215 11/12/23 0635 11/12/23 1843  HGB 16.1  --  15.2  --   --   HCT 49.1  --  47.2  --   --   PLT 153  --  128*  --   --   HEPARINUNFRC  --   --   --  0.36 <0.10*  CREATININE 1.59*  --  1.42*  --   --   TROPONINIHS 7 6  --   --   --     Estimated Creatinine Clearance: 64.8 mL/min (A) (by C-G formula based on SCr of 1.42 mg/dL (H)).   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  No anticoagulation PTA.  Patient was initiated on heparin  on 7/18. Heparin  level was undetectable on 7/19 with no issues with the infusion reported by the RN. A bolus of 3000 units was administered and the rate was increased to  1800 units/hr. No signs of bleeding documented. Patient was transitioned to apixaban  on 7/20.   Plan:  Discontinue heparin  gtt, heparin  consult, and daily heparin  levels Initiate apixaban  10 mg PO BID for 7 days with a plan to decrease to 5 mg PO BID afterwards Daily CBC F/u DOAC copay check and patient education on Monday 7/21.  Maegan Kayvan Hoefling, PharmD, RPh PGY-1 Pharmacy Resident [facility] 11/13/2023 8:04 AM

## 2023-11-13 NOTE — Plan of Care (Signed)

## 2023-11-14 ENCOUNTER — Telehealth: Payer: Self-pay | Admitting: Internal Medicine

## 2023-11-14 ENCOUNTER — Telehealth: Payer: Self-pay

## 2023-11-14 NOTE — Telephone Encounter (Signed)
 Copied from CRM [id number]. Topic: Appointments - Scheduling Inquiry for Clinic >> Nov 14, 2023 12:42 PM Yolanda T wrote:  Reason for CRM: patients wife called to schedule a 1 week HFU but there were no appts until Sept. Please f/u with wife for appt.

## 2023-11-14 NOTE — Telephone Encounter (Signed)
 Patient seen in ED per NT advisement

## 2023-11-14 NOTE — Transitions of Care (Post Inpatient/ED Visit) (Signed)
   11/14/2023  Name: Joshua [name] MRN: [id number] DOB: Jan 12, 1956  Today's TOC FU Call Status: Today's TOC FU Call Status:: Successful TOC FU Call Completed TOC FU Call Complete Date: 11/14/23 Patient's Name and Date of Birth confirmed.  Transition Care Management Follow-up Telephone Call Date of Discharge: 11/13/23 Discharge Facility: [facility]) Type of Discharge: Inpatient Admission Primary Inpatient Discharge Diagnosis:: acute PE How have you been since you were released from the hospital?: Better (He said he is feeling all right except for the on going pain in his left hip) Any questions or concerns?: No  Items Reviewed: Did you receive and understand the discharge instructions provided?: Yes Medications obtained,verified, and reconciled?: Partial Review Completed Reason for Partial Mediation Review: Patient asked that I speak to his wife about his meds. She said he has all of his medications and she did not have any questions about the meds and did not need to review the med list  She confirmed he has a nebulizer and a glucometer and she said they both check his blood sugars and can administer his insulin . Any new allergies since your discharge?: No Dietary orders reviewed?: Yes Type of Diet Ordered:: heart healthy, carb modified Do you have support at home?: Yes People in Home [RPT]: spouse Name of Support/Comfort Primary Source: his wife  Medications Reviewed Today: Medications Reviewed Today   Medications were not reviewed in this encounter     Home Care and Equipment/Supplies: Were Home Health Services Ordered?: Yes Name of Home Health Agency:: [facility] Has Agency set up a time to come to your home?: No (Patient and his wife say they have not heard from anyone yet) EMR reviewed for Home Health Orders: Orders present/patient has not received call (refer to CM for follow-up) (I called [facility] and spoke to [place]. She confirmed they have the referral  and plan for PT to see him tomorrow.) Any new equipment or medical supplies ordered?: Yes Name of Medical supply agency?: [employer]- RW and BSC Were you able to get the equipment/medical supplies?: Yes Do you have any questions related to the use of the equipment/supplies?: No  Functional Questionnaire: Do you need assistance with bathing/showering or dressing?: Yes (his wife assists as needed) Do you need assistance with meal preparation?: Yes (His wife assists with meal prep) Do you need assistance with eating?: No Do you have difficulty maintaining continence: No Do you need assistance with getting out of bed/getting out of a chair/moving?: Yes (ambulates with RW) Do you have difficulty managing or taking your medications?: Yes (His wife manages his medications.)  Follow up appointments reviewed: PCP Follow-up appointment confirmed?: Yes Date of PCP follow-up appointment?: 11/22/23 Follow-up Provider: [facility] Follow-up appointment confirmed?: NA (He had an appointment with Dr [name] tomorrow but said he cancelled for now and will re-schedule because he just was discharged from the hospital .) Do you need transportation to your follow-up appointment?: No Do you understand care options if your condition(s) worsen?: Yes-patient verbalized understanding    SIGNATURE Slater Diesel, RN

## 2023-11-14 NOTE — Telephone Encounter (Signed)
 Called patient, verified name and date of birth. Since no openings were available with PCP. I booked the earliest appointment available with another Provider. Patient acknowledged appointment.   Appointment scheduled for 11/28/2023 at 3:30 pm.

## 2023-11-15 ENCOUNTER — Ambulatory Visit: Admitting: Orthopaedic Surgery

## 2023-11-16 ENCOUNTER — Telehealth: Payer: Self-pay | Admitting: Internal Medicine

## 2023-11-16 ENCOUNTER — Other Ambulatory Visit (HOSPITAL_COMMUNITY): Payer: Self-pay

## 2023-11-16 ENCOUNTER — Other Ambulatory Visit: Payer: Self-pay

## 2023-11-16 ENCOUNTER — Encounter: Admitting: Pharmacist

## 2023-11-16 DIAGNOSIS — I82432 Acute embolism and thrombosis of left popliteal vein: Secondary | ICD-10-CM | POA: Diagnosis not present

## 2023-11-16 DIAGNOSIS — J441 Chronic obstructive pulmonary disease with (acute) exacerbation: Secondary | ICD-10-CM | POA: Diagnosis not present

## 2023-11-16 DIAGNOSIS — M87051 Idiopathic aseptic necrosis of right femur: Secondary | ICD-10-CM | POA: Diagnosis not present

## 2023-11-16 DIAGNOSIS — I82442 Acute embolism and thrombosis of left tibial vein: Secondary | ICD-10-CM | POA: Diagnosis not present

## 2023-11-16 DIAGNOSIS — I2693 Single subsegmental pulmonary embolism without acute cor pulmonale: Secondary | ICD-10-CM | POA: Diagnosis not present

## 2023-11-16 DIAGNOSIS — I82412 Acute embolism and thrombosis of left femoral vein: Secondary | ICD-10-CM | POA: Diagnosis not present

## 2023-11-16 LAB — CULTURE, BLOOD (ROUTINE X 2)
Culture: NO GROWTH
Culture: NO GROWTH
Special Requests: ADEQUATE
Special Requests: ADEQUATE

## 2023-11-16 NOTE — Progress Notes (Unsigned)
 DVT Clinic Note  Name: Joshua [name]     MRN: [id number]     DOB: 12/29/1955     Sex: male  PCP: [name], MD  Today's Visit: Visit Information: Initial Visit  Referred to DVT Clinic by: Dr. Lebron Cage - [facility] Referred to CPP by: Dr. Lanis Reason for referral:  Chief Complaint  Patient presents with   DVT   HISTORY OF PRESENT ILLNESS: Joshua [name] is a 68 y.o. male with PMH bipolar disorder, anxiety, schizoaffective disorder, cannabis use disorder, tobacco use disorder, chronic pain syndrome, COPD, DM2 with peripheral neuropathy, CKD IIIa, HTN, chronic avascular necrosis of left hip, and migraines who presents after diagnosis of DVT for medication management. Patient presented to the ED on 11/11/23 with left leg pain and swelling that had worsened over the last few days. Found to have low O2 sat and tachycardia. CTA showed segmental/subsegmental bilateral lower lobe PE with no evidence of right heart strain. Ultrasound revealed acute DVT involving the left common femoral, femoral, popliteal, posterior tibial, peroneal, and gastrocnemius veins. Patient denied a history of blood clots and reported he stays in bed or sits most of the time due to his chronic hip pain. He was transitioned to Eliquis  on 11/13/23 and discharged home with home health PT to ensure progression in his mobility.  Today, patient presents to clinic with assistance from a wheelchair accompanied by his wife and daughter. He reports adherence to Eliquis  as prescribed. Tolerating treatment well and denies s/sx of bleeding. He notes significant improvement in pain since starting on Eliquis . Leg swelling has improved as well, although still bothersome. He has been elevating his leg, but currently not using compression. Feels that his breathing is at his baseline. He notes significant chronic hip pain that limits his mobility. Stated that prior to hospitalization he would lay in bed every day, but  did get up every 2-3 hours to move around. Currently he is at his baseline and spending most of the time resting in bed, but gets up every few hours to walk around the house. He ambulates with assistance from a cane at home and is planning to start physical therapy next week.   Positive Thrombotic Risk Factors: Other (comment), Smoking, Obesity (long term sedentary lifestyle) Bleeding Risk Factors: Anticoagulant therapy  Negative Thrombotic Risk Factors: Previous VTE, Recent admission to hospital with acute illness (within 3 months), Recent surgery (within 3 months), Recent trauma (within 3 months), Paralysis, paresis, or recent plaster cast immobilization of lower extremity, Central venous catheterization, Bed rest >72 hours within 3 months, Sedentary journey lasting >8 hours within 4 weeks, Pregnancy, Within 6 weeks postpartum, Recent cesarean section (within 3 months), Estrogen therapy, Recent COVID diagnosis (within 3 months), Erythropoiesis-stimulating agent, Testosterone  therapy, Active cancer, Non-malignant, chronic inflammatory condition, Known thrombophilic condition  Rx Insurance Coverage: UHC Dual Complete Medicaid Medicare Rx Affordability: Eliquis  is $0 per 1 month supply Rx Assistance Provided: N/A Preferred Pharmacy: [facility]  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   Chronic pain    Diabetes mellitus without complication (HCC)    Generalized anxiety disorder    Headache    Hypertension    Schizoaffective disorder (HCC)  Past Surgical History:  Procedure Laterality Date   APPENDECTOMY  1970    Social History   Socioeconomic History   Marital status: Married     Spouse name: Not on file   Number of children: Not on file   Years of education: Not on file   Highest education level: 9th grade  Occupational History   Not on file  Tobacco Use   Smoking status: Every Day    Current packs/day: 1.00    Average packs/day: 1 pack/day for 50.0 years (50.0 ttl pk-yrs)    Types: Cigarettes   Smokeless tobacco: Never  Vaping Use   Vaping status: Never Used  Substance and Sexual Activity   Alcohol use: Not Currently   Drug use: Yes    Types: Marijuana    Comment: have changed to hemp now 09/17/17   Sexual activity: Yes  Other Topics Concern   Not on file  Social History Narrative   Not on file   Social Drivers of Health   Financial Resource Strain: Medium Risk (04/08/2023)   Overall Financial Resource Strain (CARDIA)    Difficulty of Paying Living Expenses: Somewhat hard  Food Insecurity: No Food Insecurity (11/12/2023)   Hunger Vital Sign    Worried About Running Out of Food in the Last Year: Never true    Ran Out of Food in the Last Year: Never true  Transportation Needs: No Transportation Needs (11/12/2023)   PRAPARE - Administrator, Civil Service (Medical): No    Lack of Transportation (Non-Medical): No  Physical Activity: Unknown (04/08/2023)   Exercise Vital Sign    Days of Exercise per Week: 0 days    Minutes of Exercise per Session: Not on file  Recent Concern: Physical Activity - Inactive (04/08/2023)   Exercise Vital Sign    Days of Exercise per Week: 0 days    Minutes of Exercise per Session: 0 min  Stress: No Stress Concern Present (04/08/2023)   [employer] of Occupational Health - Occupational Stress Questionnaire    Feeling of Stress : Not at all  Social Connections: Moderately Isolated (11/12/2023)   Social Connection and Isolation Panel    Frequency of Communication with Friends and Family: More than three times a week    Frequency of Social Gatherings with Friends and Family: More than three times a week     Attends Religious Services: Never    Database administrator or Organizations: No    Attends Banker Meetings: Never    Marital Status: Married  Catering manager Violence: Not At Risk (11/12/2023)   Humiliation, Afraid, Rape, and Kick questionnaire    Fear of Current or Ex-Partner: No    Emotionally Abused: No    Physically Abused: No    Sexually Abused: No    Family History  Problem Relation Age of Onset   Diabetes Mother    Cancer Mother        unsure what type   Heart disease Mother    Hypertension Mother    Dementia Father    Diabetes Brother    Dementia Maternal Grandfather    Cancer Paternal Grandmother 63       breast   Mental illness Neg Hx     Allergies as of 11/17/2023 - Review Complete 11/17/2023  Allergen Reaction Noted   Statins Other (See Comments) 12/09/2021   Haldol [haloperidol] Other (See Comments) 11/04/2020   Metformin  and related Diarrhea and Nausea And Vomiting 06/19/2020  Trazodone  and nefazodone Itching 11/04/2020    Current Outpatient Medications on File Prior to Visit  Medication Sig Dispense Refill   albuterol  (PROAIR  HFA) 108 (90 Base) MCG/ACT inhaler Inhale 2 puffs into the lungs every 4 (four) hours as needed for wheezing or shortness of breath. 8.5 g 3   APIXABAN  (ELIQUIS ) VTE STARTER PACK (10MG  AND 5MG ) Take as directed on package: start with two-5mg  tablets twice daily for 7 days. On day 8, switch to one-5mg  tablet twice daily. 74 each 0   cetirizine  (ZYRTEC ) 10 MG tablet Take 1 tablet (10 mg total) by mouth daily. 30 tablet 11   diclofenac  (VOLTAREN ) 50 MG EC tablet Take 1 tablet (50 mg total) by mouth 2 (two) times daily as needed for moderate pain. Eat before taking the medicine. 60 tablet 2   fluticasone -salmeterol (ADVAIR ) 250-50 MCG/ACT AEPB Inhale 1 puff into the lungs 2 (two) times daily. 60 each 6   gabapentin  (NEURONTIN ) 300 MG capsule Take 2 capsules (600 mg total) by mouth 3 (three) times daily. 180 capsule 3    insulin  glargine (LANTUS ) 100 unit/mL SOPN Inject 38 Units into the skin at bedtime. 15 mL 4   insulin  lispro (HUMALOG  KWIKPEN) 100 UNIT/ML KwikPen Inject 10 Units into the skin 3 (three) times daily. 15 mL 3   ipratropium-albuterol  (DUONEB) 0.5-2.5 (3) MG/3ML SOLN Take 3 mLs by nebulization every 6 (six) hours as needed (shortness of breath). 180 mL 1   methocarbamol  (ROBAXIN ) 750 MG tablet Take 1 tablet (750 mg total) by mouth every 8 (eight) hours as needed for muscle spasms. 90 tablet 1   omeprazole  (PRILOSEC) 20 MG capsule Take 1 capsule (20 mg total) by mouth daily. 90 capsule 1   paliperidone  (INVEGA ) 6 MG 24 hr tablet Take 6 mg by mouth at bedtime.     Semaglutide , 2 MG/DOSE, (OZEMPIC , 2 MG/DOSE,) 8 MG/3ML SOPN Inject 2 mg as directed once a week. 9 mL 2   Spacer/Aero-Holding Chambers (AEROCHAMBER MV) inhaler Use as instructed 1 each 0   SUMAtriptan  (IMITREX ) 50 MG tablet Take 1 tablet by mouth at the start of headache.  May repeat in 2 hours if headache persists. Max 2 tabs/24hr. 10 tablet 3   traMADol  (ULTRAM ) 50 MG tablet Take 1 tablet (50 mg total) by mouth every 6 (six) hours as needed for moderate pain (pain score 4-6) or severe pain (pain score 7-10). 120 tablet 2   valsartan -hydrochlorothiazide  (DIOVAN -HCT) 320-25 MG tablet Take 1 tablet by mouth daily. 90 tablet 3   Accu-Chek FastClix Lancets MISC Use to check blood sugars three times per day 102 each 11   blood glucose meter kit and supplies KIT Dispense based on patient and insurance preference. Use up to four times daily as directed. ICD-10 E11.65  Z79.4 1 each 0   Blood Glucose Monitoring Suppl (ACCU-CHEK GUIDE ME) w/Device KIT Use to check blood sugars up to 3 times per day. ICD-10 E11.65, Z79.4 1 kit 0   Blood Pressure Monitoring (BLOOD PRESSURE KIT) DEVI Use to measure blood pressure 1 each 0   glucose blood (ACCU-CHEK GUIDE) test strip Use as instructed to check blood sugars three times per day. 100 each 11   Insulin  Pen  Needle (PEN NEEDLES) 32G X 4 MM MISC Use to inject insulin  four times daily. Also, Ozempic  once weekly. 100 each 6   Lancets Misc. (ACCU-CHEK FASTCLIX LANCET) KIT Use to check blood sugars 3 times per day 1 kit 11   Respiratory Therapy Supplies (  FLUTTER) DEVI Use with 4 times daily (Patient not taking: Reported on 04/08/2023) 1 each 0   No current facility-administered medications on file prior to visit.   REVIEW OF SYSTEMS:  Review of Systems  Respiratory:  Negative for shortness of breath.   Cardiovascular:  Positive for leg swelling (left).  Musculoskeletal:  Positive for joint pain. Negative for myalgias.  Neurological:  Negative for tingling.   PHYSICAL EXAMINATION:  Vitals:   11/17/23 1539  BP: 113/81  SpO2: 94%    There is no height or weight on file to calculate BMI.  Physical Exam Pulmonary:     Effort: Pulmonary effort is normal.  Musculoskeletal:        General: Tenderness (LLE) present.     Left lower leg: Edema (moderate, non-pitting, extends from thigh to ankle) present.  Neurological:     Mental Status: He is alert.  Psychiatric:        Mood and Affect: Mood normal.    Villalta Score for Post-Thrombotic Syndrome: Pain: Mild Cramps: Absent Heaviness: Absent Paresthesia: Absent Pruritus: Absent Pretibial Edema: Moderate Skin Induration: Absent Hyperpigmentation: Absent Redness: Mild Venous Ectasia: Absent Pain on calf compression: Mild Villalta Preliminary Score: 5 Is venous ulcer present?: No If venous ulcer is present and score is <15, then 15 points total are assigned: Absent Villalta Total Score: 5  LABS:  CBC     Component Value Date/Time   WBC 6.7 11/13/2023 1100   RBC 5.03 11/13/2023 1100   HGB 15.5 11/13/2023 1100   HGB 17.2 07/09/2021 1630   HCT 46.0 11/13/2023 1100   HCT 50.8 07/09/2021 1630   PLT 127 (L) 11/13/2023 1100   PLT 237 07/09/2021 1630   MCV 91.5 11/13/2023 1100   MCV 90 07/09/2021 1630   MCH 30.8 11/13/2023 1100    MCHC 33.7 11/13/2023 1100   RDW 14.8 11/13/2023 1100   RDW 13.6 07/09/2021 1630   LYMPHSABS 1.7 11/11/2023 2055   LYMPHSABS 2.7 07/09/2021 1630   MONOABS 0.7 11/11/2023 2055   EOSABS 0.3 11/11/2023 2055   EOSABS 0.1 07/09/2021 1630   BASOSABS 0.1 11/11/2023 2055   BASOSABS 0.1 07/09/2021 1630    Hepatic Function      Component Value Date/Time   PROT 5.8 (L) 11/12/2023 0215   PROT 6.5 05/03/2022 1506   ALBUMIN 2.9 (L) 11/12/2023 0215   ALBUMIN 4.4 05/03/2022 1506   AST 15 11/12/2023 0215   ALT 16 11/12/2023 0215   ALKPHOS 80 11/12/2023 0215   BILITOT 0.5 11/12/2023 0215   BILITOT 0.2 05/03/2022 1506    Renal Function   Lab Results  Component Value Date   CREATININE 1.15 11/13/2023   CREATININE 1.42 (H) 11/12/2023   CREATININE 1.59 (H) 11/11/2023    Estimated Creatinine Clearance: 80.1 mL/min (by C-G formula based on SCr of 1.15 mg/dL).   [facility] Vascular Lab Studies:  11/13/23 VAS US  LOWER EXTREMITY VENOUS LEFT: RIGHT:  - No evidence of common femoral vein obstruction.    LEFT:  - Findings consistent with acute deep vein thrombosis involving the left common femoral vein, left femoral vein, left popliteal vein, left posterior tibial veins, and left peroneal veins. External iliac vein is patent. Findings consistent with acute intramuscular thrombosis involving the left gastrocnemius veins.   - No cystic structure found in the popliteal fossa.   11/11/23 CT Angio Chest PE W and/or Wo Contrast: IMPRESSION: 1. Segmental/subsegmental bilateral lower lobe pulmonary emboli. Overall clotburden is small. No right heart strain. 2. 12 mm  irregular right lower lobe nodule, favoring mild infection/inflammation. Consider follow-up CT chest in 3 to 6 months.  ASSESSMENT: Location of DVT: Left femoral vein, Left common femoral vein, Left popliteal vein, Left distal vein Cause of DVT: provoked by a persistent risk factor  Patient without prior history of DVT diagnosed with acute DVT in the  left common femoral, femoral, popliteal, posterior tibial, and peroneal veins extending into the gastrocnemius veins. Also found to have segmental/subsegmental bilateral lower lobe pulmonary emboli with no evidence of right heart strain. Risk factors for DVT include immobility due to chronic pain and smoking. He was appropriately transitioned to Eliquis  starter pack on discharge from the hospital and is tolerating treatment well. Left leg pain has significantly improved and SOB is at baseline with COPD. He still has swelling from his thigh to his ankle, but feels this is slowly improving as well. Discussed patient with Dr. Lanis as thrombus extends to the common femoral vein. Since DVT is nonocclusive in the common femoral vein and his symptoms are improving, would not recommend vascular surgery intervention at this time, but encouraged use of compression stockings to help with swelling. Reviewed the risk of post-thrombotic syndrome. Encouraged him to continue elevating his leg and fitted him for thigh high compression stockings. Given DVT is likely provoked by his long term immobility related to chronic pain, would recommend to continue anticoagulation indefinitely as long as the benefits outweigh the risks. Provided him with Eliquis  refills today and no medication access barriers were identified. Extensively counseled on Eliquis  and all patient questions were answered. Of note, he is currently prescribed oral diclofenac  as needed by his PCP for chronic pain and he is taking this twice daily. Reviewed the increased risk of bleeding with coadministration of Eliquis  and diclofenac . Encouraged him to minimize use of diclofenac  if possible and he said he would try Tylenol  instead. Also encouraged him to discuss further with PCP at upcoming visit.   PLAN: -Continue apixaban  (Eliquis ) 10 mg twice daily for 7 days followed by 5 mg twice daily. -Expected duration of therapy: Indefinite. Therapy started on  11/11/23. -Patient educated on purpose, proper use and potential adverse effects of apixaban  (Eliquis ). -Discussed importance of taking medication around the same time every day. -Advised patient of medications to avoid (NSAIDs, aspirin doses >100 mg daily). -Educated that Tylenol  (acetaminophen ) is the preferred analgesic to lower the risk of bleeding. -Advised patient to alert all providers of anticoagulation therapy prior to starting a new medication or having a procedure. -Emphasized importance of monitoring for signs and symptoms of bleeding (abnormal bruising, prolonged bleeding, nose bleeds, bleeding from gums, discolored urine, black tarry stools). -Educated patient to present to the ED if emergent signs and symptoms of new thrombosis occur. -Measured patient for compression stockings. -Counseled patient to wear compression stockings daily, removing at night.  Follow up: DVT clinic as needed, PCP as scheduled  Izetta Henry, PharmD [facility] Clinical Pharmacist  I have evaluated the patient's chart/imaging and refer this patient to the Clinical Pharmacist Practitioner for medication management. I have reviewed the CPP's documentation and agree with her assessment and plan. I was immediately available during the visit for questions and collaboration.   Fonda FORBES Lanis, MD

## 2023-11-16 NOTE — Telephone Encounter (Signed)
 Copied from CRM [id number]. Topic: Clinical - Home Health Verbal Orders >> Nov 16, 2023  4:22 PM Wess RAMAN wrote:  Caller/Agency: [facility] Callback Number: [phone] Service Requested: Physical Therapy Frequency: 1 week 1; 2 week 4; 1 week 2 Any new concerns about the patient? No

## 2023-11-17 ENCOUNTER — Other Ambulatory Visit: Payer: Self-pay

## 2023-11-17 ENCOUNTER — Ambulatory Visit: Attending: Vascular Surgery | Admitting: Pharmacist

## 2023-11-17 VITALS — BP 113/81

## 2023-11-17 DIAGNOSIS — I82412 Acute embolism and thrombosis of left femoral vein: Secondary | ICD-10-CM | POA: Diagnosis not present

## 2023-11-17 DIAGNOSIS — M7989 Other specified soft tissue disorders: Secondary | ICD-10-CM

## 2023-11-17 MED ORDER — APIXABAN 5 MG PO TABS
5.0000 mg | ORAL_TABLET | Freq: Two times a day (BID) | ORAL | 4 refills | Status: DC
  Filled 2023-11-17 – 2023-12-08 (×2): qty 60, 30d supply, fill #0
  Filled 2024-01-04: qty 60, 30d supply, fill #1
  Filled 2024-02-09: qty 60, 30d supply, fill #2
  Filled 2024-03-12: qty 60, 30d supply, fill #3
  Filled 2024-04-09: qty 60, 30d supply, fill #4

## 2023-11-17 NOTE — Telephone Encounter (Signed)
 I spoke to [facility] and gave he authorization for the PT visits requested.

## 2023-11-17 NOTE — Patient Instructions (Addendum)
-  Continue apixaban  (Eliquis ) 10 mg twice daily for 7 days for 3 more days followed by 5 mg twice daily. -Your refills have been sent to [facility] at [facility]. You may need to call the pharmacy to ask them to fill this when you start to run low on your current supply.  -It is important to take your medication around the same time every day.  -Avoid NSAIDs like ibuprofen  (Advil , Motrin ) and naproxen (Aleve) as well as aspirin doses over 100 mg daily. -Tylenol  (acetaminophen ) is the preferred over the counter pain medication to lower the risk of bleeding. -Be sure to alert all of your health care providers that you are taking an anticoagulant prior to starting a new medication or having a procedure. -Monitor for signs and symptoms of bleeding (abnormal bruising, prolonged bleeding, nose bleeds, bleeding from gums, discolored urine, black tarry stools). If you have fallen and hit your head OR if your bleeding is severe or not stopping, seek emergency care.  -Go to the emergency room if emergent signs and symptoms of new clot occur (new or worse swelling and pain in an arm or leg, shortness of breath, chest pain, fast or irregular heartbeats, lightheadedness, dizziness, fainting, coughing up blood) or if you experience a significant color change (pale or blue) in the extremity that has the DVT.  -We recommend you wear compression stockings (20-30 mmHg) as long as you are having swelling or pain.   If you have any questions or need to reschedule an appointment, please call [phone]. If you are having an emergency, call 911 or present to the nearest emergency room.   What is a DVT?  -Deep vein thrombosis (DVT) is a condition in which a blood clot forms in a vein of the deep venous system which can occur in the lower leg, thigh, pelvis, arm, or neck. This condition is serious and can be life-threatening if the clot travels to the arteries of the lungs and causing a blockage  (pulmonary embolism, PE). A DVT can also damage veins in the leg, which can lead to long-term venous disease, leg pain, swelling, discoloration, and ulcers or sores (post-thrombotic syndrome).  -Treatment may include taking an anticoagulant medication to prevent more clots from forming and the current clot from growing, wearing compression stockings, and/or surgical procedures to remove or dissolve the clot.

## 2023-11-18 ENCOUNTER — Telehealth: Payer: Self-pay | Admitting: Internal Medicine

## 2023-11-18 NOTE — Telephone Encounter (Signed)
 Copied from CRM [id number]. Topic: General - Other >> Nov 18, 2023  1:59 PM Rosaria BRAVO wrote:  Reason for CRM: [name] Pharmacist from [facility] called to follow up on Fax medication recommendation sent on July [phone]

## 2023-11-21 ENCOUNTER — Telehealth: Payer: Self-pay | Admitting: Internal Medicine

## 2023-11-21 NOTE — Telephone Encounter (Signed)
 Called pt to confirm appt for 7/29 LVM

## 2023-11-22 ENCOUNTER — Telehealth: Payer: Self-pay

## 2023-11-22 ENCOUNTER — Encounter: Payer: Self-pay | Admitting: Internal Medicine

## 2023-11-22 ENCOUNTER — Ambulatory Visit: Attending: Internal Medicine | Admitting: Internal Medicine

## 2023-11-22 ENCOUNTER — Other Ambulatory Visit: Payer: Self-pay

## 2023-11-22 VITALS — BP 152/86 | HR 90 | Temp 97.9°F | Ht 69.0 in

## 2023-11-22 DIAGNOSIS — F17210 Nicotine dependence, cigarettes, uncomplicated: Secondary | ICD-10-CM | POA: Diagnosis not present

## 2023-11-22 DIAGNOSIS — I152 Hypertension secondary to endocrine disorders: Secondary | ICD-10-CM | POA: Diagnosis not present

## 2023-11-22 DIAGNOSIS — I2699 Other pulmonary embolism without acute cor pulmonale: Secondary | ICD-10-CM | POA: Diagnosis not present

## 2023-11-22 DIAGNOSIS — I824Z2 Acute embolism and thrombosis of unspecified deep veins of left distal lower extremity: Secondary | ICD-10-CM | POA: Diagnosis not present

## 2023-11-22 DIAGNOSIS — Z1211 Encounter for screening for malignant neoplasm of colon: Secondary | ICD-10-CM

## 2023-11-22 DIAGNOSIS — Z7985 Long-term (current) use of injectable non-insulin antidiabetic drugs: Secondary | ICD-10-CM

## 2023-11-22 DIAGNOSIS — E1159 Type 2 diabetes mellitus with other circulatory complications: Secondary | ICD-10-CM | POA: Diagnosis not present

## 2023-11-22 DIAGNOSIS — J439 Emphysema, unspecified: Secondary | ICD-10-CM

## 2023-11-22 DIAGNOSIS — Z794 Long term (current) use of insulin: Secondary | ICD-10-CM | POA: Diagnosis not present

## 2023-11-22 DIAGNOSIS — M87052 Idiopathic aseptic necrosis of left femur: Secondary | ICD-10-CM

## 2023-11-22 DIAGNOSIS — F17200 Nicotine dependence, unspecified, uncomplicated: Secondary | ICD-10-CM

## 2023-11-22 DIAGNOSIS — E1169 Type 2 diabetes mellitus with other specified complication: Secondary | ICD-10-CM

## 2023-11-22 DIAGNOSIS — Z09 Encounter for follow-up examination after completed treatment for conditions other than malignant neoplasm: Secondary | ICD-10-CM

## 2023-11-22 MED ORDER — AMLODIPINE BESYLATE 5 MG PO TABS
5.0000 mg | ORAL_TABLET | Freq: Every day | ORAL | 1 refills | Status: DC
  Filled 2023-11-22: qty 90, 90d supply, fill #0
  Filled 2024-02-16: qty 90, 90d supply, fill #1

## 2023-11-22 MED ORDER — METHOCARBAMOL 750 MG PO TABS
750.0000 mg | ORAL_TABLET | Freq: Three times a day (TID) | ORAL | 1 refills | Status: DC | PRN
  Filled 2023-11-22 – 2023-12-23 (×2): qty 90, 30d supply, fill #0
  Filled 2024-01-24: qty 90, 30d supply, fill #1

## 2023-11-22 NOTE — Progress Notes (Unsigned)
 Patient ID: Joshua [name], male    DOB: 01-02-1956  MRN: [id number]  CC: TOC Date of hospitalization: 7/18-20/2025 Date of call from caseworker 11/14/2023 Hospitalization Follow-up (Hospitalization f/u. Med refills. /No questions / concerns/)   Subjective: Joshua [name] is a 68 y.o. male who presents for transition of care visit  His concerns today include:  Patient with history of DM type II, HL with statin myopathy, HTN, PAD, tobacco dependence, COPD spinal stenosis lumbar spine, migraines, GAD/schizoaffective bipolar type   Discussed the use of AI scribe software for clinical note transcription with the patient, who gave verbal consent to proceed.  History of Present Illness Joshua [name] is a 68 year old male who presents for a hospital follow-up and transition of care visit.  He was hospitalized on July 18-20, 2025, due to swelling in the left leg and hypotension. He was diagnosed with segmental/subsegmental bilateral lower lobe pulmonary embolism with a small clot burden and no evidence of right heart strain on echo. Echo showed EF of 60 to 65%, no RWMM and G1DD. Doppler ultrasound revealed deep vein thrombosis in the left common femoral, popliteal, posterior tibial, and peroneal veins, as well as an acute intramuscular thrombus in the left gastroc vein. He was initially treated with IV heparin  and then switched to Eliquis .  Home physical therapy was ordered.  He experiences ongoing swelling in the left leg, which has decreased somewhat but remains significant.  He saw a vein and vascular posthospitalization on 11/17/2023 and was measured for thigh-high compression stockings which he has picked up but has not started using as yet.  He elevates his leg.  He was called by the physical therapist today and he told him that he had a visit with me today.  He wonders whether he needs to home physical therapy.  He is on Eliquis  for anticoagulation and reports experiencing more  frequent headaches since starting the blood thinner, noting a current headache. His blood pressure was recorded as 156/97 at home and 152/86 during the visit. 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 He was referred to orthopedics but missed the appointment due to his recent hospitalization. He is currently taking tramadol  for pain management and does not wish to switch to stronger medication.  He has a history of smoking and reports some wheezing, which he attributes to a cold and has not been using his inhaler regularly.  He should be on Advair  inhaler and has DuoNeb treatments he acknowledges the difficulty in quitting smoking.  He has not had a recent eye exam for diabetic retinopathy and has not completed colon cancer screening. He has a Cologuard kit at home but has not yet used it.    Patient Active Problem List   Diagnosis Date Noted   AKI (acute kidney injury) (HCC) 11/12/2023   Acute pulmonary embolism (HCC) 11/11/2023   Type 2 diabetes mellitus with morbid obesity (HCC) 08/30/2023   Narcotic use agreement exists 04/10/2023   Gait disturbance 04/10/2023   COPD (chronic obstructive pulmonary disease) (HCC) 08/03/2022   Statin myopathy 12/09/2021   Spinal stenosis of lumbar region 02/19/2020   Tobacco dependence 08/05/2019   Type 2 diabetes mellitus with hyperglycemia (HCC) 05/14/2019   Peripheral vascular disease due to secondary diabetes (HCC) 05/14/2019   Chronic low back pain with left-sided sciatica 05/14/2019   COPD with  chronic bronchitis (HCC) 03/27/2019   Schizoaffective disorder (HCC) 07/24/2018   Chronic pain syndrome 10/14/2016   Osteoarthritis 10/14/2016   HTN (hypertension) 10/14/2016   Migraine headache without aura 10/14/2016   Schizoaffective disorder, bipolar type (HCC) 05/10/2016   Generalized anxiety disorder  05/04/2016     Current Outpatient Medications on File Prior to Visit  Medication Sig Dispense Refill   Accu-Chek FastClix Lancets MISC Use to check blood sugars three times per day 102 each 11   albuterol  (PROAIR  HFA) 108 (90 Base) MCG/ACT inhaler Inhale 2 puffs into the lungs every 4 (four) hours as needed for wheezing or shortness of breath. 8.5 g 3   apixaban  (ELIQUIS ) 5 MG TABS tablet Take 1 tablet (5 mg total) by mouth 2 (two) times daily. Start after completion of starter pack. 60 tablet 4   APIXABAN  (ELIQUIS ) VTE STARTER PACK (10MG  AND 5MG ) Take as directed on package: start with two-5mg  tablets twice daily for 7 days. On day 8, switch to one-5mg  tablet twice daily. 74 each 0   blood glucose meter kit and supplies KIT Dispense based on patient and insurance preference. Use up to four times daily as directed. ICD-10 E11.65  Z79.4 1 each 0   Blood Glucose Monitoring Suppl (ACCU-CHEK GUIDE ME) w/Device KIT Use to check blood sugars up to 3 times per day. ICD-10 E11.65, Z79.4 1 kit 0   Blood Pressure Monitoring (BLOOD PRESSURE KIT) DEVI Use to measure blood pressure 1 each 0   cetirizine  (ZYRTEC ) 10 MG tablet Take 1 tablet (10 mg total) by mouth daily. 30 tablet 11   diclofenac  (VOLTAREN ) 50 MG EC tablet Take 1 tablet (50 mg total) by mouth 2 (two) times daily as needed for moderate pain. Eat before taking the medicine. 60 tablet 2   fluticasone -salmeterol (ADVAIR ) 250-50 MCG/ACT AEPB Inhale 1 puff into the lungs 2 (two) times daily. 60 each 6   gabapentin  (NEURONTIN ) 300 MG capsule Take 2 capsules (600 mg total) by mouth 3 (three) times daily. 180 capsule 3   glucose blood (ACCU-CHEK GUIDE) test strip Use as instructed to check blood sugars three times per day. 100 each 11   insulin  glargine (LANTUS ) 100 unit/mL SOPN Inject 38 Units into the skin at bedtime. 15 mL 4   insulin  lispro (HUMALOG  KWIKPEN) 100 UNIT/ML KwikPen Inject 10 Units into the skin 3 (three) times daily. 15 mL 3   Insulin  Pen  Needle (PEN NEEDLES) 32G X 4 MM MISC Use to inject insulin  four times daily. Also, Ozempic  once weekly. 100 each 6   ipratropium-albuterol  (DUONEB) 0.5-2.5 (3) MG/3ML SOLN Take 3 mLs by nebulization every 6 (six) hours as needed (shortness of breath). 180 mL 1   Lancets Misc. (ACCU-CHEK FASTCLIX LANCET) KIT Use to check blood sugars 3 times per day 1 kit 11   omeprazole  (PRILOSEC) 20 MG capsule Take 1 capsule (20 mg total) by mouth daily. 90 capsule 1   paliperidone  (INVEGA ) 6 MG 24 hr tablet Take 6 mg by mouth at bedtime.     Semaglutide , 2 MG/DOSE, (OZEMPIC , 2 MG/DOSE,) 8 MG/3ML SOPN Inject 2 mg as directed once a week. 9 mL 2   Spacer/Aero-Holding Chambers (AEROCHAMBER MV) inhaler Use as instructed 1 each 0   SUMAtriptan  (IMITREX ) 50 MG tablet Take 1 tablet by mouth at the start of headache.  May repeat in 2 hours if headache persists. Max 2 tabs/24hr. 10 tablet 3   traMADol  (ULTRAM ) 50 MG tablet Take 1 tablet (50 mg total)  by mouth every 6 (six) hours as needed for moderate pain (pain score 4-6) or severe pain (pain score 7-10). 120 tablet 2   valsartan -hydrochlorothiazide  (DIOVAN -HCT) 320-25 MG tablet Take 1 tablet by mouth daily. 90 tablet 3   Respiratory Therapy Supplies (FLUTTER) DEVI Use with 4 times daily (Patient not taking: Reported on 11/22/2023) 1 each 0   No current facility-administered medications on file prior to visit.    Allergies  Allergen Reactions   Statins Other (See Comments)    Myopathy   Haldol [Haloperidol] Other (See Comments)    Unk reaction   Metformin  And Related Diarrhea and Nausea And Vomiting   Trazodone  And Nefazodone Itching    Social History   Socioeconomic History   Marital status: Married    Spouse name: Not on file   Number of children: Not on file   Years of education: Not on file   Highest education level: 9th grade  Occupational History   Not on file  Tobacco Use   Smoking status: Every Day    Current packs/day: 1.00    Average  packs/day: 1 pack/day for 50.0 years (50.0 ttl pk-yrs)    Types: Cigarettes   Smokeless tobacco: Never  Vaping Use   Vaping status: Never Used  Substance and Sexual Activity   Alcohol use: Not Currently   Drug use: Yes    Types: Marijuana    Comment: have changed to hemp now 09/17/17   Sexual activity: Yes  Other Topics Concern   Not on file  Social History Narrative   Not on file   Social Drivers of Health   Financial Resource Strain: Medium Risk (04/08/2023)   Overall Financial Resource Strain (CARDIA)    Difficulty of Paying Living Expenses: Somewhat hard  Food Insecurity: No Food Insecurity (11/12/2023)   Hunger Vital Sign    Worried About Running Out of Food in the Last Year: Never true    Ran Out of Food in the Last Year: Never true  Transportation Needs: No Transportation Needs (11/12/2023)   PRAPARE - Administrator, Civil Service (Medical): No    Lack of Transportation (Non-Medical): No  Physical Activity: Unknown (04/08/2023)   Exercise Vital Sign    Days of Exercise per Week: 0 days    Minutes of Exercise per Session: Not on file  Recent Concern: Physical Activity - Inactive (04/08/2023)   Exercise Vital Sign    Days of Exercise per Week: 0 days    Minutes of Exercise per Session: 0 min  Stress: No Stress Concern Present (04/08/2023)   [employer] of Occupational Health - Occupational Stress Questionnaire    Feeling of Stress : Not at all  Social Connections: Moderately Isolated (11/12/2023)   Social Connection and Isolation Panel    Frequency of Communication with Friends and Family: More than three times a week    Frequency of Social Gatherings with Friends and Family: More than three times a week    Attends Religious Services: Never    Database administrator or Organizations: No    Attends Banker Meetings: Never    Marital Status: Married  Catering manager Violence: Not At Risk (11/12/2023)   Humiliation, Afraid, Rape, and  Kick questionnaire    Fear of Current or Ex-Partner: No    Emotionally Abused: No    Physically Abused: No    Sexually Abused: No    Family History  Problem Relation Age of Onset   Diabetes Mother  Cancer Mother        unsure what type   Heart disease Mother    Hypertension Mother    Dementia Father    Diabetes Brother    Dementia Maternal Grandfather    Cancer Paternal Grandmother 26       breast   Mental illness Neg Hx     Past Surgical History:  Procedure Laterality Date   APPENDECTOMY  1970    ROS: Review of Systems Negative except as stated above  PHYSICAL EXAM: BP (!) 152/86   Pulse 90   Temp 97.9 F (36.6 C) (Oral)   Ht 5' 9 (1.753 m)   SpO2 98%   BMI 37.44 kg/m   Physical Exam   General appearance - alert, elderly Caucasian male who appears older than stated age.  He is sitting in wheelchair and in no distress Mental status -patient is alert.  He answers questions appropriately. Mouth -oral mucosa is moist. Neck -no cervical lymphadenopathy Chest -mild diffuse wheezing with good air entry. Heart - normal rate, regular rhythm, normal S1, S2, no murmurs, rubs, clicks or gallops Extremities -trace edema in the right lower extremity.  1+ pitting edema with edema to the calf muscle on the left lower extremity. Skin: Skin on both legs and arms with large scales and very dry    Latest Ref Rng & Units 11/13/2023   11:00 AM 11/12/2023    2:15 AM 11/11/2023    8:55 PM  CMP  Glucose 70 - 99 mg/dL 889  877  783   BUN 8 - 23 mg/dL 23  27  30    Creatinine 0.61 - 1.24 mg/dL 8.84  8.57  8.40   Sodium 135 - 145 mmol/L 134  136  136   Potassium 3.5 - 5.1 mmol/L 4.3  4.4  4.5   Chloride 98 - 111 mmol/L 100  101  99   CO2 22 - 32 mmol/L 20  28  27    Calcium  8.9 - 10.3 mg/dL 9.3  8.6  8.5   Total Protein 6.5 - 8.1 g/dL  5.8    Total Bilirubin 0.0 - 1.2 mg/dL  0.5    Alkaline Phos 38 - 126 U/L  80    AST 15 - 41 U/L  15    ALT 0 - 44 U/L  16     Lipid Panel      Component Value Date/Time   CHOL 175 08/03/2022 1456   TRIG 171 (H) 08/03/2022 1456   HDL 50 08/03/2022 1456   CHOLHDL 3.5 08/03/2022 1456   CHOLHDL 3.3 09/30/2016 0651   VLDL 35 09/30/2016 0651   LDLCALC 96 08/03/2022 1456    CBC    Component Value Date/Time   WBC 6.7 11/13/2023 1100   RBC 5.03 11/13/2023 1100   HGB 15.5 11/13/2023 1100   HGB 17.2 07/09/2021 1630   HCT 46.0 11/13/2023 1100   HCT 50.8 07/09/2021 1630   PLT 127 (L) 11/13/2023 1100   PLT 237 07/09/2021 1630   MCV 91.5 11/13/2023 1100   MCV 90 07/09/2021 1630   MCH 30.8 11/13/2023 1100   MCHC 33.7 11/13/2023 1100   RDW 14.8 11/13/2023 1100   RDW 13.6 07/09/2021 1630   LYMPHSABS 1.7 11/11/2023 2055   LYMPHSABS 2.7 07/09/2021 1630   MONOABS 0.7 11/11/2023 2055   EOSABS 0.3 11/11/2023 2055   EOSABS 0.1 07/09/2021 1630   BASOSABS 0.1 11/11/2023 2055   BASOSABS 0.1 07/09/2021 1630    ASSESSMENT AND  PLAN: 1. Hospital discharge follow-up (Primary)   2. Acute deep vein thrombosis (DVT) of distal vein of left lower extremity (HCC) 3. Acute pulmonary embolism without acute cor pulmonale, unspecified pulmonary embolism type (HCC) Patient with significant DVT in the left leg with bilateral pulmonary embolism.  Advised patient that his decreased mobility and cigarette smoking are the contributing factors.  He most likely will need to be on Eliquis  lifelong unless his mobility significantly improves.  Encouraged him to use the compression stockings that was prescribed - CBC  4. Hypertension associated with diabetes (HCC) Not at goal.  Continue Diovan /HCTZ 320/25 mg daily.  Add amlodipine  5 mg daily. - amLODipine  (NORVASC ) 5 MG tablet; Take 1 tablet (5 mg total) by mouth daily.  Dispense: 90 tablet; Refill: 1 - Ambulatory referral to Ophthalmology - Basic Metabolic Panel  5. Pulmonary emphysema, unspecified emphysema type (HCC) Encouraged him to use his Advair  inhaler every day and the nebulizer treatments as  needed.  6. Tobacco dependence Strongly encouraged him to quit.  Not ready to give a trial of quitting.  7. Avascular necrosis of hip, left (HCC) Patient agreeable to being rescheduled with orthopedics. - AMB referral to orthopedics  8. Screening for colon cancer Encourage patient to use the Cologuard kit and have his wife take it to [employer] to send it back.  He promises to do so.  Addendum 11/23/2023: I forgot to mention to patient yesterday that a 12 mm nodule was seen in the right lower lobe on CTA of the chest that was done in hospital.  We will need to repeat CAT scan in 3 to 6 months.  Tried to call pt 2 x already for today.  LVMM. Will have my CMA try to reach him.  Patient was given the opportunity to ask questions.  Patient verbalized understanding of the plan and was able to repeat key elements of the plan.   This documentation was completed using Paediatric nurse.  Any transcriptional errors are unintentional.  Orders Placed This Encounter  Procedures   CBC   Basic Metabolic Panel   Ambulatory referral to Ophthalmology   AMB referral to orthopedics     Requested Prescriptions   Signed Prescriptions Disp Refills   methocarbamol  (ROBAXIN ) 750 MG tablet 90 tablet 1    Sig: Take 1 tablet (750 mg total) by mouth every 8 (eight) hours as needed for muscle spasms.   amLODipine  (NORVASC ) 5 MG tablet 90 tablet 1    Sig: Take 1 tablet (5 mg total) by mouth daily.    No follow-ups on file.  Barnie Louder, MD, FACP

## 2023-11-22 NOTE — Patient Instructions (Signed)
 VISIT SUMMARY:  You had a follow-up visit today to discuss your recent hospitalization for deep vein thrombosis (DVT) and pulmonary embolism (PE). We reviewed your current symptoms, medications, and overall health status. We also discussed your ongoing issues with mobility, pain management, and other chronic conditions.  YOUR PLAN:  -BILATERAL PULMONARY EMBOLISM AND LEFT LOWER EXTREMITY DEEP VEIN THROMBOSIS: You have blood clots in your lungs and left leg. Continue taking Eliquis  as prescribed, use your compression stockings, and consider starting home physical therapy to improve your mobility. Be vigilant for any signs of bleeding, especially after falls. We will monitor your blood count and kidney function regularly.  -DECREASED MOBILITY: Your mobility is limited due to conditions in your hip and knee. Home physical therapy is recommended to help improve your movement.  -AVASCULAR NECROSIS OF LEFT HIP: This condition causes the bone tissue in your hip to die due to lack of blood supply, leading to pain and reduced mobility. We need to reschedule your orthopedic appointment to explore further treatment options.  -OSTEOARTHRITIS OF LEFT KNEE: This is a type of arthritis that occurs when the protective cartilage on the ends of your bones wears down over time. We will reschedule your orthopedic appointment to discuss your pain management and treatment options.  -CHRONIC KIDNEY DISEASE STAGE 3 WITH ACUTE KIDNEY INJURY: You have moderate kidney disease with a recent episode of acute kidney injury. We will continue to monitor your kidney function closely, especially since you are on blood thinners.  -HYPERTENSION: Your blood pressure is higher than normal. We are adding amlodipine  5 mg once daily to help control it.  -TYPE 2 DIABETES MELLITUS: You have diabetes, and it is important to have regular eye exams to check for any complications. We will refer you to an ophthalmologist for a diabetic eye  exam.  -CHRONIC OBSTRUCTIVE PULMONARY DISEASE (COPD): This is a chronic lung disease that makes it hard to breathe. Please use your inhalers as prescribed and consider quitting smoking to improve your lung health.  -NICOTINE  DEPENDENCE, CIGARETTES: Smoking is harmful to your health and increases your risk of complications. We strongly encourage you to quit smoking.  INSTRUCTIONS:  Please follow up with the following: 1. Use your compression stockings and start home physical therapy. 2. Monitor for any signs of bleeding and report them immediately. 3. Schedule and attend your rescheduled orthopedic appointment. 4. Take amlodipine  5 mg once daily for your blood pressure. 5. Complete your diabetic eye exam with the ophthalmologist. 6. Use your inhalers as prescribed and work on quitting smoking. 7. Return for blood count and kidney function tests as ordered.

## 2023-11-22 NOTE — Telephone Encounter (Signed)
 Dianca, PT with [facility] called to confirm that PT is safe for the pt.  Advised that PT is appropriate for the pt and planned according to Chart Notes.

## 2023-11-23 ENCOUNTER — Telehealth: Payer: Self-pay | Admitting: Internal Medicine

## 2023-11-23 ENCOUNTER — Ambulatory Visit: Payer: Self-pay | Admitting: Internal Medicine

## 2023-11-23 DIAGNOSIS — I82442 Acute embolism and thrombosis of left tibial vein: Secondary | ICD-10-CM | POA: Diagnosis not present

## 2023-11-23 DIAGNOSIS — I82412 Acute embolism and thrombosis of left femoral vein: Secondary | ICD-10-CM | POA: Diagnosis not present

## 2023-11-23 DIAGNOSIS — I2693 Single subsegmental pulmonary embolism without acute cor pulmonale: Secondary | ICD-10-CM | POA: Diagnosis not present

## 2023-11-23 DIAGNOSIS — I82432 Acute embolism and thrombosis of left popliteal vein: Secondary | ICD-10-CM | POA: Diagnosis not present

## 2023-11-23 DIAGNOSIS — E875 Hyperkalemia: Secondary | ICD-10-CM

## 2023-11-23 DIAGNOSIS — J441 Chronic obstructive pulmonary disease with (acute) exacerbation: Secondary | ICD-10-CM | POA: Diagnosis not present

## 2023-11-23 DIAGNOSIS — M87051 Idiopathic aseptic necrosis of right femur: Secondary | ICD-10-CM | POA: Diagnosis not present

## 2023-11-23 LAB — CBC
Hematocrit: 51.2 % — ABNORMAL HIGH (ref 37.5–51.0)
Hemoglobin: 17 g/dL (ref 13.0–17.7)
MCH: 31.3 pg (ref 26.6–33.0)
MCHC: 33.2 g/dL (ref 31.5–35.7)
MCV: 94 fL (ref 79–97)
Platelets: 185 x10E3/uL (ref 150–450)
RBC: 5.43 x10E6/uL (ref 4.14–5.80)
RDW: 14.7 % (ref 11.6–15.4)
WBC: 9.1 x10E3/uL (ref 3.4–10.8)

## 2023-11-23 LAB — BASIC METABOLIC PANEL WITH GFR
BUN/Creatinine Ratio: 21 (ref 10–24)
BUN: 27 mg/dL (ref 8–27)
CO2: 21 mmol/L (ref 20–29)
Calcium: 9.3 mg/dL (ref 8.6–10.2)
Chloride: 100 mmol/L (ref 96–106)
Creatinine, Ser: 1.26 mg/dL (ref 0.76–1.27)
Glucose: 141 mg/dL — ABNORMAL HIGH (ref 70–99)
Potassium: 5.7 mmol/L — ABNORMAL HIGH (ref 3.5–5.2)
Sodium: 137 mmol/L (ref 134–144)
eGFR: 63 mL/min/1.73 (ref >59)

## 2023-11-23 NOTE — Addendum Note (Signed)
 Addended by: VICCI SOBER B on: 11/23/2023 11:23 AM   Modules accepted: Orders

## 2023-11-23 NOTE — Telephone Encounter (Signed)
 Besides his lab results, please also inform patient that I recommend discontinuing the medication called Diclofenac .  This is an anti-inflammatory arthritis medication.  However in combination with the blood thinner Eliquis , this can increase his risk of bleeding.  Also I forgot to mention on his visit yesterday that a small nodule/spot was seen in the right lower lungs on CAT scan of the chest that was done in hospital.  Given that he is a smoker and is at higher risk for lung cancer because of smoking we will need to repeat a CAT scan in 3 to 6 months to assess for any change in this nodule.  I tried reaching pt vis phone x 2 this morning unsuccessfully.

## 2023-11-23 NOTE — Progress Notes (Signed)
 Potassium level is elevated.  High potassium can cause the heart to go into abnormal rhythms which can be deadly.  Cut back on potassium rich foods like bananas, oranges and orange juice.  Please return to the lab on Monday for recheck of potassium level. Kidney function is not 100% but improved compared to when he was in the hospital. Blood cell counts are okay.

## 2023-11-23 NOTE — Progress Notes (Signed)
 Spoke with patient advised of the following:  Potassium level is elevated.  High potassium can cause the heart to go into abnormal rhythms which can be deadly.  Cut back on potassium rich foods like bananas, oranges and orange juice.  Please return to the lab on Monday for recheck of potassium level. Kidney function is not 100% but improved compared to when he was in the hospital. Blood cell counts are okay. esides his lab results, please also inform patient that I recommend discontinuing the medication called Diclofenac .  This is an anti-inflammatory arthritis medication.  However in combination with the blood thinner Eliquis , this can increase his risk of bleeding.   Also I forgot to mention on his visit yesterday that a small nodule/spot was seen in the right lower lungs on CAT scan of the chest that was done in hospital.  Given that he is a smoker and is at higher risk for lung cancer because of smoking we will need to repeat a CAT scan in 3 to 6 months to assess for any change in this nodule.  Patient voiced  he does drink OJ  and only eat Bananas,and oranges on occasion. Reviewed other K+ containing foods . Patient voiced that he eats almonds all the thing.  Patient voiced that he would stop the almonds. Patient voiced understanding of all that was discussed. Patient scheduled for repeat labs 0708/2025.

## 2023-11-23 NOTE — Telephone Encounter (Signed)
 See notes

## 2023-11-23 NOTE — Telephone Encounter (Signed)
 Call to [facility] was not able to speak with [name]. Spoke with [name]  who as able to see Joshua [name]'s notes from home visit with the patient. Joshua [name]'s call was to recommend that the patient be started on a statin as it is best practices for those individual with Diabetes'. Advised I would send to patient's PCP for their recommendations.

## 2023-11-23 NOTE — Telephone Encounter (Signed)
 Fax received and will send back my response. Pt intolerant of statins.

## 2023-11-23 NOTE — Telephone Encounter (Unsigned)
 Copied from CRM [id number]. Topic: Clinical - Medical Advice >> Nov 23, 2023  1:25 PM Tiffini S wrote: Reason for CRM: Patient missed call for lab results- informed patient that it was recommended to discontinue the medication called Diclofenac .  Patient is asking for a call back to go over the labs/ medication.

## 2023-11-24 LAB — LIPID PANEL
Chol/HDL Ratio: 3 ratio (ref 0.0–5.0)
Cholesterol, Total: 156 mg/dL (ref 100–199)
HDL: 52 mg/dL (ref >39)
LDL Chol Calc (NIH): 84 mg/dL (ref 0–99)
Triglycerides: 112 mg/dL (ref 0–149)
VLDL Cholesterol Cal: 20 mg/dL (ref 5–40)

## 2023-11-24 LAB — SPECIMEN STATUS REPORT

## 2023-11-24 NOTE — Telephone Encounter (Signed)
 See result follow-up notes

## 2023-11-25 DIAGNOSIS — I82412 Acute embolism and thrombosis of left femoral vein: Secondary | ICD-10-CM | POA: Diagnosis not present

## 2023-11-25 DIAGNOSIS — J441 Chronic obstructive pulmonary disease with (acute) exacerbation: Secondary | ICD-10-CM | POA: Diagnosis not present

## 2023-11-25 DIAGNOSIS — I82432 Acute embolism and thrombosis of left popliteal vein: Secondary | ICD-10-CM | POA: Diagnosis not present

## 2023-11-25 DIAGNOSIS — M87051 Idiopathic aseptic necrosis of right femur: Secondary | ICD-10-CM | POA: Diagnosis not present

## 2023-11-25 DIAGNOSIS — I82442 Acute embolism and thrombosis of left tibial vein: Secondary | ICD-10-CM | POA: Diagnosis not present

## 2023-11-25 DIAGNOSIS — I2693 Single subsegmental pulmonary embolism without acute cor pulmonale: Secondary | ICD-10-CM | POA: Diagnosis not present

## 2023-11-28 ENCOUNTER — Ambulatory Visit: Attending: Internal Medicine

## 2023-11-28 ENCOUNTER — Inpatient Hospital Stay (INDEPENDENT_AMBULATORY_CARE_PROVIDER_SITE_OTHER): Admitting: Primary Care

## 2023-11-28 ENCOUNTER — Other Ambulatory Visit: Payer: Self-pay

## 2023-11-28 DIAGNOSIS — E875 Hyperkalemia: Secondary | ICD-10-CM | POA: Diagnosis not present

## 2023-11-29 ENCOUNTER — Ambulatory Visit: Payer: Self-pay | Admitting: Family Medicine

## 2023-11-29 DIAGNOSIS — M87852 Other osteonecrosis, left femur: Secondary | ICD-10-CM | POA: Diagnosis not present

## 2023-11-29 DIAGNOSIS — R262 Difficulty in walking, not elsewhere classified: Secondary | ICD-10-CM | POA: Diagnosis not present

## 2023-11-29 LAB — BASIC METABOLIC PANEL WITH GFR
BUN/Creatinine Ratio: 22 (ref 10–24)
BUN: 30 mg/dL — ABNORMAL HIGH (ref 8–27)
CO2: 21 mmol/L (ref 20–29)
Calcium: 9.3 mg/dL (ref 8.6–10.2)
Chloride: 94 mmol/L — ABNORMAL LOW (ref 96–106)
Creatinine, Ser: 1.35 mg/dL — ABNORMAL HIGH (ref 0.76–1.27)
Glucose: 105 mg/dL — ABNORMAL HIGH (ref 70–99)
Potassium: 5 mmol/L (ref 3.5–5.2)
Sodium: 132 mmol/L — ABNORMAL LOW (ref 134–144)
eGFR: 58 mL/min/1.73 — ABNORMAL LOW (ref >59)

## 2023-11-29 LAB — SPECIMEN STATUS REPORT

## 2023-12-06 ENCOUNTER — Ambulatory Visit: Admitting: Orthopaedic Surgery

## 2023-12-08 ENCOUNTER — Other Ambulatory Visit: Payer: Self-pay

## 2023-12-09 ENCOUNTER — Other Ambulatory Visit: Payer: Self-pay

## 2023-12-15 ENCOUNTER — Other Ambulatory Visit: Payer: Self-pay

## 2023-12-19 ENCOUNTER — Other Ambulatory Visit: Payer: Self-pay | Admitting: Critical Care Medicine

## 2023-12-19 ENCOUNTER — Other Ambulatory Visit: Payer: Self-pay | Admitting: Internal Medicine

## 2023-12-19 DIAGNOSIS — M545 Low back pain, unspecified: Secondary | ICD-10-CM

## 2023-12-19 DIAGNOSIS — G894 Chronic pain syndrome: Secondary | ICD-10-CM

## 2023-12-19 DIAGNOSIS — G8929 Other chronic pain: Secondary | ICD-10-CM

## 2023-12-19 DIAGNOSIS — G629 Polyneuropathy, unspecified: Secondary | ICD-10-CM

## 2023-12-20 ENCOUNTER — Other Ambulatory Visit: Payer: Self-pay

## 2023-12-20 ENCOUNTER — Ambulatory Visit: Payer: Self-pay

## 2023-12-20 MED ORDER — CETIRIZINE HCL 10 MG PO TABS
10.0000 mg | ORAL_TABLET | Freq: Every day | ORAL | 6 refills | Status: AC
  Filled 2023-12-20: qty 30, 30d supply, fill #0
  Filled 2024-01-16: qty 30, 30d supply, fill #1
  Filled 2024-02-16: qty 30, 30d supply, fill #2
  Filled 2024-03-16: qty 30, 30d supply, fill #3
  Filled 2024-04-14: qty 30, 30d supply, fill #4
  Filled 2024-05-14: qty 30, 30d supply, fill #5

## 2023-12-20 NOTE — Telephone Encounter (Signed)
 FYI Only or Action Required?: Action required by provider: update on patient condition and medication request.  Patient was last seen in primary care on 11/22/2023 by [name], MD.  Called Nurse Triage reporting Hip Pain.  Symptoms began chronic.  Interventions attempted: Prescription medications: Tramadol  and Rest, hydration, or home remedies.  Symptoms are: gradually worsening.  Triage Disposition: See HCP Within 4 Hours (Or PCP Triage)  Patient/caregiver understands and will follow disposition?: No, wishes to speak with PCP   Copied from CRM #[id number]. Topic: Clinical - Red Word Triage >> Dec 20, 2023 11:50 AM Myrick T wrote: [facility] that prompted transfer to Nurse Triage: patients wife called stated he is in a lot of pain every since he stopped staking the diclofenac  (VOLTAREN ) 50 MG EC tablet. He was told it was interring with the blood thinner he has been on. Patient is in pain when he sit down, lay down or even try to move. He is having trouble walking at this point due to the pain in his left hip Reason for Disposition  [1] SEVERE pain (e.g., excruciating, unable to do any normal activities) AND [2] not improved after 2 hours of pain medicine  Answer Assessment - Initial Assessment Questions Additional info: 1) Has appt next week with ortho surgeon. Voltaren  was stopped due to blood thinner, they would like replacement pain relief. Already has Tramadol  for back pain, but not effective enough. Would like to have replacement for Voltaren  PO.  2) He had been participating in home PT but had to put this on hold until after hip replacement due to severe pain.  3) Has 4 month follow up scheduled 01/02/24. Next appointment with pcp for follow up to worsening chronic condition is not available until January 31, 2024. Family preference is replacement medication for Voltaren . Please advise.   1. LOCATION and RADIATION: Where is the pain located? Does the pain spread (shoot)  anywhere else?     Left hip radiating to left leg and foot  2. QUALITY: What does the pain feel like?  (e.g., sharp, dull, aching, burning)     pain 3. SEVERITY: How bad is the pain? What does it keep you from doing?   (Scale 1-10; or mild, moderate, severe)     Severe 4. ONSET: When did the pain start? Does it come and go, or is it there all the time?     constant 5. WORK OR EXERCISE: Has there been any recent work or exercise that involved this part of the body?      No 6. CAUSE: What do you think is causing the hip pain?      Unsure-stopped Voltaren  per orders pain has been increasing since stopping this.  7. AGGRAVATING FACTORS: What makes the hip pain worse? (e.g., walking, climbing stairs, running)     Movement. Having difficulty ambulating due to pain.  8. OTHER SYMPTOMS: Do you have any other symptoms? (e.g., back pain, pain shooting down leg,  fever, rash)     Left knee pain  Protocols used: Hip Pain-A-AH

## 2023-12-20 NOTE — Telephone Encounter (Signed)
 Let patient know that since he cannot take Voltaren , he can take Tylenol  500 mg 4 times a day with the tramadol .  If this is not holding him, we can refer him to pain management.  We cannot use any ibuprofen , Aleve, Advil  or Naprosyn since he is on blood thinner.

## 2023-12-21 ENCOUNTER — Other Ambulatory Visit: Payer: Self-pay

## 2023-12-21 MED ORDER — TRAMADOL HCL 50 MG PO TABS
50.0000 mg | ORAL_TABLET | Freq: Four times a day (QID) | ORAL | 3 refills | Status: DC | PRN
  Filled 2023-12-21: qty 120, 30d supply, fill #0
  Filled 2024-01-16 – 2024-01-18 (×2): qty 120, 30d supply, fill #1
  Filled 2024-02-16: qty 120, 30d supply, fill #2

## 2023-12-21 MED ORDER — GABAPENTIN 300 MG PO CAPS
600.0000 mg | ORAL_CAPSULE | Freq: Three times a day (TID) | ORAL | 6 refills | Status: AC
  Filled 2023-12-21: qty 180, 30d supply, fill #0
  Filled 2024-01-16: qty 180, 30d supply, fill #1
  Filled 2024-02-16: qty 180, 30d supply, fill #2
  Filled 2024-03-16: qty 180, 30d supply, fill #3
  Filled 2024-04-14: qty 180, 30d supply, fill #4
  Filled 2024-05-14: qty 180, 30d supply, fill #5

## 2023-12-21 NOTE — Telephone Encounter (Signed)
 Called but no answer. LVM to cal back.

## 2023-12-22 NOTE — Telephone Encounter (Signed)
 Called & spoke to the patient. Verified name & DOB. Patient confirmed that he has spoken to a nurse who has informed of Dr.Johnson's message below. He stated that Tylenol  has been helping alleviate his pain and is feeling better. No further concerns at this time.

## 2023-12-23 ENCOUNTER — Other Ambulatory Visit: Payer: Self-pay

## 2023-12-26 ENCOUNTER — Other Ambulatory Visit: Payer: Self-pay

## 2023-12-27 ENCOUNTER — Other Ambulatory Visit: Payer: Self-pay

## 2023-12-27 ENCOUNTER — Ambulatory Visit: Admitting: Orthopaedic Surgery

## 2023-12-30 ENCOUNTER — Telehealth: Payer: Self-pay | Admitting: Internal Medicine

## 2023-12-30 DIAGNOSIS — R262 Difficulty in walking, not elsewhere classified: Secondary | ICD-10-CM | POA: Diagnosis not present

## 2023-12-30 DIAGNOSIS — M87852 Other osteonecrosis, left femur: Secondary | ICD-10-CM | POA: Diagnosis not present

## 2023-12-30 NOTE — Telephone Encounter (Signed)
 Called patient, no answer. Left voicemail confirming upcoming appointment on 01/02/2024 Provided callback number for any questions or changes.

## 2024-01-02 ENCOUNTER — Ambulatory Visit: Admitting: Internal Medicine

## 2024-01-08 ENCOUNTER — Other Ambulatory Visit: Payer: Self-pay | Admitting: Internal Medicine

## 2024-01-09 ENCOUNTER — Other Ambulatory Visit: Payer: Self-pay

## 2024-01-09 MED ORDER — OMEPRAZOLE 20 MG PO CPDR
20.0000 mg | DELAYED_RELEASE_CAPSULE | Freq: Every day | ORAL | 1 refills | Status: AC
  Filled 2024-01-09: qty 90, 90d supply, fill #0
  Filled 2024-04-09: qty 90, 90d supply, fill #1

## 2024-01-11 ENCOUNTER — Other Ambulatory Visit: Payer: Self-pay

## 2024-01-12 ENCOUNTER — Telehealth: Payer: Self-pay | Admitting: Internal Medicine

## 2024-01-12 NOTE — Telephone Encounter (Signed)
 Lvm to confirm appt 9/19

## 2024-01-13 ENCOUNTER — Ambulatory Visit: Admitting: Pharmacist

## 2024-01-13 NOTE — Telephone Encounter (Signed)
 Called patient. No Answer. Left a detailed voicemail for patient to call back.

## 2024-01-13 NOTE — Telephone Encounter (Signed)
 Copied from CRM #[id number]. Topic: Appointments - Appointment Cancel/Reschedule >> Jan 12, 2024  3:38 PM Montie POUR wrote:  Patient/patient representative is calling to cancel or reschedule an appointment. Refer to attachments for appointment information.  Please call Todd at [phone] to reschedule his appointment that is scheduled for 01/13/24 at 3:00 PM. Thanks

## 2024-01-16 ENCOUNTER — Other Ambulatory Visit: Payer: Self-pay

## 2024-01-17 ENCOUNTER — Other Ambulatory Visit: Payer: Self-pay

## 2024-01-19 ENCOUNTER — Other Ambulatory Visit: Payer: Self-pay

## 2024-01-24 ENCOUNTER — Other Ambulatory Visit: Payer: Self-pay

## 2024-01-25 ENCOUNTER — Other Ambulatory Visit: Payer: Self-pay

## 2024-01-29 DIAGNOSIS — M87852 Other osteonecrosis, left femur: Secondary | ICD-10-CM | POA: Diagnosis not present

## 2024-01-29 DIAGNOSIS — R262 Difficulty in walking, not elsewhere classified: Secondary | ICD-10-CM | POA: Diagnosis not present

## 2024-02-02 ENCOUNTER — Other Ambulatory Visit: Payer: Self-pay

## 2024-02-02 ENCOUNTER — Other Ambulatory Visit: Payer: Self-pay | Admitting: Internal Medicine

## 2024-02-02 DIAGNOSIS — E1165 Type 2 diabetes mellitus with hyperglycemia: Secondary | ICD-10-CM

## 2024-02-02 MED ORDER — INSULIN GLARGINE 100 UNITS/ML SOLOSTAR PEN
38.0000 [IU] | PEN_INJECTOR | Freq: Every day | SUBCUTANEOUS | 4 refills | Status: AC
  Filled 2024-02-02: qty 15, 39d supply, fill #0
  Filled 2024-03-14: qty 15, 39d supply, fill #1
  Filled 2024-04-22: qty 15, 39d supply, fill #2

## 2024-02-09 ENCOUNTER — Ambulatory Visit: Admitting: Internal Medicine

## 2024-02-16 ENCOUNTER — Other Ambulatory Visit: Payer: Self-pay

## 2024-02-16 ENCOUNTER — Ambulatory Visit: Admitting: Pharmacist

## 2024-02-17 ENCOUNTER — Ambulatory Visit: Payer: Self-pay

## 2024-02-17 NOTE — Telephone Encounter (Signed)
 FYI Only or Action Required?: FYI only for provider.  Patient was last seen in primary care on 11/22/2023 by [name], MD.  Called Nurse Triage reporting Pain.  Symptoms began several years ago.  Interventions attempted: Rest, hydration, or home remedies.  Symptoms are: stable.  Triage Disposition: Home Care  Patient/caregiver understands and will follow disposition?: Yes Reason for Disposition  Hip pain  Answer Assessment - Initial Assessment Questions Patient a bit hard to understand / follow, talking in circles. Patient states legs look ok, not swollen, having trouble walking. Pain is chronic symptom and takes medication. Patient mentioned how he wants a doctor to come see him in his bed, advised patient he would need to talk to PCP about home health services.   1. LOCATION and RADIATION: Where is the pain located? Does the pain spread (shoot) anywhere else?     Left hip  2. QUALITY: What does the pain feel like?  (e.g., sharp, dull, aching, burning)     Sharp pain  3. SEVERITY: How bad is the pain? What does it keep you from doing?   (Scale 1-10; or mild, moderate, severe)     Having trouble walking  4. ONSET: When did the pain start? Does it come and go, or is it there all the time?     Quite some time  5. CAUSE: What do you think is causing the hip pain?      Unsure  6. OTHER SYMPTOMS: Do you have any other symptoms? (e.g., back pain, pain shooting down leg,  fever, rash)     Left knee pain  Protocols used: Hip Pain-A-AH  Copied from CRM #[id number]. Topic: Clinical - Red Word Triage >> Feb 17, 2024  9:48 AM Lonell PEDLAR wrote: Red Word that prompted transfer to Nurse Triage: Patient in a lot of pain, hips, knees, leg swelling, (Possible clots)

## 2024-02-17 NOTE — Telephone Encounter (Signed)
 Pt see pt message. Missed recent appt. See if he is requesting HH/PCS. If he feels he is now bed-bound and can not come to appts, may need to have [facility] look into something like [employer] calls for him.

## 2024-02-17 NOTE — Telephone Encounter (Signed)
 Noted. Will call patient.

## 2024-02-17 NOTE — Telephone Encounter (Signed)
 FYI

## 2024-02-21 NOTE — Telephone Encounter (Signed)
 Call to patient x3 . Have been unable to contact message left enough time. Will continue to reach to patient and to his spouse.

## 2024-02-21 NOTE — Telephone Encounter (Signed)
 You have sent this message back to me. Did you call and speak with the pt based on the message I sent you 01/28/2024 and if so, what does he want to do?

## 2024-02-22 ENCOUNTER — Telehealth: Payer: Self-pay | Admitting: Internal Medicine

## 2024-02-22 NOTE — Telephone Encounter (Signed)
 Thanks Joshua [name]. I tried calling pt today as well to inquire whether he would like some home P.T. LVMM informing of who I am and inquiring whether he wanted me to order home P.T. Also reminded him that he is on Eliquis , the blood thinner, that increases risk for bleeding with falls. Advise to be seen in ER if he is having any headaches post recent fall where he hit his head.

## 2024-02-22 NOTE — Telephone Encounter (Signed)
 PC placed to pt.  Pt states he lays in bed most of the time due to pain in hips and knees. I told him that home P.T would be able to eval and give recommendations to help prevent further falls. Pt thinks P.T would cause him to hurt more. I offered referral to pain management but pt declines stating he is getting along okay with Tramadol . He declined P.T I inquired whether pain in hips increase with recent fall. Pt said a little. I told him to be seen in ER if increase pain to make sure he has not fractured. He inquired about the conversation he had with CW yesterday about transitioning to one of the health organizations that offer home provider visits.  I told him this is an option to explore if he feels he is now home bound as I do not do home visits. He said he will look into it but wants to keep appt with me that was rescheduled for next mth.

## 2024-02-22 NOTE — Telephone Encounter (Signed)
 Copied from CRM [id number]. Topic: Clinical - Medical Advice >> Feb 22, 2024 12:50 PM Emylou G wrote: Reason for CRM: Adv patient of msg from Dr Vicci - he would like a call back from her to discuss further

## 2024-02-22 NOTE — Telephone Encounter (Signed)
 Called & spoke to the patient. Verified name & DOB. Clarified that patient is still having serious pain on left leg - stabbing pains. Concerns that PT is going to worsen the pain that he experiences. He stated that he is unable to stand on it or put much weight to walk on it. His pain on the pain scale is a 10 when walking / standing. He stated that the Tramadol  helps to ease some of the pain but would like further clarification on how PT will help with pain. Please advise.

## 2024-02-24 ENCOUNTER — Other Ambulatory Visit: Payer: Self-pay

## 2024-02-24 ENCOUNTER — Other Ambulatory Visit: Payer: Self-pay | Admitting: Internal Medicine

## 2024-02-24 MED ORDER — METHOCARBAMOL 750 MG PO TABS
750.0000 mg | ORAL_TABLET | Freq: Three times a day (TID) | ORAL | 0 refills | Status: DC | PRN
  Filled 2024-02-24: qty 90, 30d supply, fill #0

## 2024-02-25 ENCOUNTER — Encounter (HOSPITAL_COMMUNITY): Payer: Self-pay | Admitting: *Deleted

## 2024-02-25 ENCOUNTER — Emergency Department (HOSPITAL_COMMUNITY)

## 2024-02-25 ENCOUNTER — Emergency Department (HOSPITAL_COMMUNITY)
Admission: EM | Admit: 2024-02-25 | Discharge: 2024-02-25 | Disposition: A | Discharge status: Home / Self Care | Attending: Emergency Medicine | Admitting: Emergency Medicine

## 2024-02-25 ENCOUNTER — Other Ambulatory Visit: Payer: Self-pay

## 2024-02-25 DIAGNOSIS — Z7901 Long term (current) use of anticoagulants: Secondary | ICD-10-CM | POA: Diagnosis not present

## 2024-02-25 DIAGNOSIS — J449 Chronic obstructive pulmonary disease, unspecified: Secondary | ICD-10-CM | POA: Diagnosis not present

## 2024-02-25 DIAGNOSIS — S0990XA Unspecified injury of head, initial encounter: Secondary | ICD-10-CM | POA: Diagnosis present

## 2024-02-25 DIAGNOSIS — E1122 Type 2 diabetes mellitus with diabetic chronic kidney disease: Secondary | ICD-10-CM | POA: Insufficient documentation

## 2024-02-25 DIAGNOSIS — S91114A Laceration without foreign body of right lesser toe(s) without damage to nail, initial encounter: Secondary | ICD-10-CM | POA: Insufficient documentation

## 2024-02-25 DIAGNOSIS — N183 Chronic kidney disease, stage 3 unspecified: Secondary | ICD-10-CM | POA: Diagnosis not present

## 2024-02-25 DIAGNOSIS — I129 Hypertensive chronic kidney disease with stage 1 through stage 4 chronic kidney disease, or unspecified chronic kidney disease: Secondary | ICD-10-CM | POA: Insufficient documentation

## 2024-02-25 DIAGNOSIS — S99921A Unspecified injury of right foot, initial encounter: Secondary | ICD-10-CM | POA: Diagnosis present

## 2024-02-25 DIAGNOSIS — W06XXXA Fall from bed, initial encounter: Secondary | ICD-10-CM | POA: Diagnosis not present

## 2024-02-25 DIAGNOSIS — S91119A Laceration without foreign body of unspecified toe without damage to nail, initial encounter: Secondary | ICD-10-CM

## 2024-02-25 HISTORY — DX: Chronic obstructive pulmonary disease, unspecified: J44.9

## 2024-02-25 MED ORDER — CEPHALEXIN 500 MG PO CAPS
500.0000 mg | ORAL_CAPSULE | Freq: Four times a day (QID) | ORAL | 0 refills | Status: DC
  Filled 2024-02-25: qty 20, 5d supply, fill #0

## 2024-02-25 MED ORDER — OXYCODONE HCL 5 MG PO TABS
5.0000 mg | ORAL_TABLET | Freq: Once | ORAL | Status: AC
  Administered 2024-02-25: 5 mg via ORAL
  Filled 2024-02-25: qty 1

## 2024-02-25 MED ORDER — LIDOCAINE HCL (PF) 1 % IJ SOLN
10.0000 mL | Freq: Once | INTRAMUSCULAR | Status: AC
  Administered 2024-02-25: 10 mL
  Filled 2024-02-25: qty 10

## 2024-02-25 MED ORDER — CEFAZOLIN SODIUM-DEXTROSE 1-4 GM/50ML-% IV SOLN
1.0000 g | Freq: Once | INTRAVENOUS | Status: AC
  Administered 2024-02-25: 1 g via INTRAVENOUS
  Filled 2024-02-25: qty 50

## 2024-02-25 MED ORDER — CEPHALEXIN 500 MG PO CAPS: 500.0000 mg | ORAL_CAPSULE | Freq: Four times a day (QID) | ORAL | 0 refills | Status: AC

## 2024-02-25 NOTE — Discharge Instructions (Signed)
 Joshua [name]:  Thank you for allowing us  to take care of you today.  We hope you begin feeling better soon. You were seen today for a fall.  While you are here we performed physical exam, vital signs, as well as CT and x-ray imaging.  There is no emergent was found today.  Specifically no acute head bleeds or fractures.  While you are here we repaired your lacerations with sutures.  You do have 3 stitches in both your 3rd and 4th digit of your right foot.  Please keep these clean wash with unscented soap and water, pat dry do not rub them dry.  Keep them after washing dry and clean.  Please go to your PCP within 1 week for a wound recheck.  The sutures themselves are absorbable, but given the location you will need to be on antibiotics and have a wound recheck.  Please take antibiotics as prescribed.  To-Do:  Please follow-up with your primary doctor within the next 2-3 days. It is important that you review any labs or imaging results (if any) that you had today with them. Your preliminary imaging results (if any) are attached. Please return to the Emergency Department or call 911 if you experience chest pain, shortness of breath, severe pain, severe fever, altered mental status, or have any reason to think that you need emergency medical care.  Thank you again.  Hope you feel better soon.  Department of Emergency Medicine

## 2024-02-25 NOTE — ED Provider Notes (Signed)
 [place] EMERGENCY DEPARTMENT AT [facility] Provider Note  MDM   HPI/ROS:  Joshua [name] is a 68 y.o. 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  Patient reports that he slid off of his bed and fell to the ground and was unable to get his rollator.  He endorses hitting his head but denies any LOC.  He states that he believes something was caught in between his right foot toes and had bleeding and right foot pain.  On my initial evaluation, patient is:  -Vital signs stable. Patient afebrile, hemodynamically stable, and non-toxic appearing. Physical exam is notable for: - 3rd, 4th and 5th digit laceration with preserved flexion and extension of the toes, no bone exposure --On tertiary physical examination, no other external signs of trauma, no point tenderness, neurologic exam is nonfocal   No indication for chest, abdomen, spine imaging.  CT head obtained given the patient is on blood thinners and does report hitting his head despite no LOC and no neurologic change.  CT head and C-spine both without any acute traumatic abnormalities.  CT head with chronic subdural which patient is aware of.  Low suspicion for syncope, presyncope, cardiac or neurologic cause of fall.   -Additional history obtained from wife and daughter who arrived later and at bedside.  They corroborate the patient did have a ground-level mechanical fall.  They endorse the patient is acting at his baseline, but he has had some recurrent falls and has a follow-up appointment with PCP.  They voiced understanding of how to care for wound, suture/laceration follow-up care and need for follow-up within 1 week for wound check.     Interpretations, interventions, and  the patient's course of care are documented below.        Disposition:  I discussed the plan for discharge with the patient and/or their surrogate at bedside prior to discharge and they were in agreement with the plan and verbalized understanding of the return precautions provided. All questions answered to the best of my ability. Ultimately, the patient was discharged in stable condition with stable vital signs. I am reassured that they are capable of close follow up and good social support at home.   Clinical Impression: No diagnosis found.  Rx / DC Orders ED Discharge Orders     None       Clinical Complexity A medically appropriate history, review of systems, and physical exam was performed.  My independent interpretations of EKG, labs, and radiology are documented in the ED course above.   If decision rules were used in this patient's evaluation, they are listed below.   Click here for ABCD2, HEART and other calculatorsREFRESH Note before signing   Patient's presentation is most consistent with acute, uncomplicated illness.  Medical Decision Making Amount and/or Complexity of Data Reviewed Radiology: ordered.  Risk Prescription drug management.   HPI/ROS      See MDM section for pertinent HPI and ROS. A complete ROS was performed with pertinent positives/negatives noted above.   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   Chronic pain    COPD (chronic obstructive pulmonary disease) (HCC)    Diabetes mellitus without complication (HCC)    Generalized anxiety disorder    Headache    Hypertension    Schizoaffective disorder [facility])     Past Surgical History:  Procedure Laterality Date    APPENDECTOMY  1970      Physical Exam   Vitals:   02/25/24 1801 02/25/24 1805  BP: 120/77   Pulse: 100   Resp: 16   Temp: 98.6 F (37 C)   TempSrc: Oral   SpO2: 95%   Weight:  115 kg  Height:  5' 9 (1.753 m)    Physical Exam   Procedures   If procedures were preformed on this patient, they are listed below:  .Laceration Repair  Date/Time: 02/25/2024 8:37 PM  Performed by: Billy Pal, MD Authorized by: Ruthe Cornet, DO   Consent:    Consent obtained:  Verbal   Consent given by:  Patient   Risks discussed:  Infection, need for additional repair, nerve damage, poor wound healing, poor cosmetic result and pain   Alternatives discussed:  No treatment and delayed treatment Universal protocol:    Patient identity confirmed:  Arm band Anesthesia:    Anesthesia method:  Local infiltration   Local anesthetic:  Lidocaine  1% WITH epi Laceration details:    Location:  Toe   Toe location: Right 3rd and 4th toe.   Length (cm):  2   Depth (mm):  2 Pre-procedure details:    Preparation:  Patient was prepped and draped in usual sterile fashion Exploration:    Limited defect created (wound extended): yes     Imaging outcome: foreign body not noted     Wound exploration: wound explored through full range of motion     Wound extent: fascia violated     Contaminated: no   Treatment:    Area cleansed with:  Chlorhexidine   Amount of cleaning:  Extensive   Irrigation solution:  Sterile saline   Irrigation volume:  1L   Irrigation method:  Pressure wash Skin repair:    Repair method:  Sutures   Suture size:  5-0   Suture material:  Chromic gut   Suture technique:  Simple interrupted   Number of sutures: 3 each toe. Approximation:    Approximation:  Close Repair type:    Repair type:  Simple Post-procedure details:    Dressing:  Adhesive bandage   Procedure completion:  Tolerated well, no immediate complications    Please note that this documentation was  produced with the assistance of voice-to-text technology and may contain errors.     Billy Pal, MD 02/25/24 2040    Ruthe Cornet, DO 02/25/24 2107    Ruthe Cornet, DO 02/25/24 2220

## 2024-02-25 NOTE — ED Triage Notes (Addendum)
 PT here via [facility] from home where he slid off the bed and a sock thread was caught under his toes. Lacerations to R three toes of R foot. Pt is on blood thinners.  No loc.  He did not hit his head.  Bleeding is controlled.  Pt states he was unable to get up on his own and had to call his son to come and get him.

## 2024-02-25 NOTE — ED Notes (Addendum)
 Gave and put pt ortho shoe on to the right foot  Dressed right foot laceration with vaseline gauze and curlex - new socks applied

## 2024-02-26 ENCOUNTER — Other Ambulatory Visit (HOSPITAL_COMMUNITY): Payer: Self-pay

## 2024-02-27 ENCOUNTER — Encounter: Payer: Self-pay | Admitting: Radiology

## 2024-03-01 NOTE — Telephone Encounter (Signed)
 He does not have to be concerned about whether we would take him back if he does not like the in-home provider.  I would take him back.  Patient should not let that be a determining factor of whether he wants to try/except the in-home provider.

## 2024-03-05 ENCOUNTER — Ambulatory Visit: Payer: Self-pay

## 2024-03-05 ENCOUNTER — Other Ambulatory Visit: Payer: Self-pay

## 2024-03-05 ENCOUNTER — Other Ambulatory Visit: Payer: Self-pay | Admitting: Internal Medicine

## 2024-03-05 ENCOUNTER — Telehealth: Payer: Self-pay | Admitting: Internal Medicine

## 2024-03-05 DIAGNOSIS — M545 Low back pain, unspecified: Secondary | ICD-10-CM

## 2024-03-05 DIAGNOSIS — G894 Chronic pain syndrome: Secondary | ICD-10-CM

## 2024-03-05 NOTE — Telephone Encounter (Signed)
 FYI Only or Action Required?: FYI only for provider: ED advised. Refused, would like rx for pain and sleeping medications.  Patient was last seen in primary care on 11/22/2023 by [name], MD.  Called Nurse Triage reporting Hip Pain.  Symptoms began a week ago.  Interventions attempted: OTC medications: tylenol .  Symptoms are: gradually worsening.  Triage Disposition: Go to ED Now (or PCP Triage)  Patient/caregiver understands and will follow disposition?: No  Copied from CRM [id number]. Topic: Clinical - Medication Question >> Mar 05, 2024  3:47 PM Myrick T wrote: Reason for CRM: patients wife Sari called to see if patient can take Tylenol  PM at night to help with his pain and sleep. Please f/u with wife Reason for Disposition  Patient sounds very sick or weak to the triager  Answer Assessment - Initial Assessment Questions SEVERITY: How bad is the pain? What does it keep you from doing?   (Scale 1-10; or mild, moderate, severe)     7 or 8  Pt calling with wife stating that his leg is hurting from the hip to the toes. Pt states that it is the leg I had the blood clot in. Pt states it feels similar and that it is more white than normal. Pt denies tingling or numbness in the toes. Pt states that he has pain throughout the entire leg and has for about a week, pt states that he is not currently being followed for the blood clot, states that it occurred in July. Pt states that he has stopped taking his muscle relaxer bc it was causing falls. Pt requesting pain medications and something to make him sleep as the pain is keeping him awake. Pt was advised to go to the ED for evaluation, pt refused, states he can't today, also that he needs 2 grown men to get him up and down the stairs, but he doesn't have time today. Pt was advised that he could utilize 911 to facilitate going to the ED, pt refused.  Pt would like an update about the pain and sleep medication from his  PCP.  Protocols used: Hip Pain-A-AH

## 2024-03-05 NOTE — Telephone Encounter (Signed)
 Contacted patient, patient is scheduled for 11/20 at 3:30 pm, advised patient of the mobile clinic, patient declined.

## 2024-03-05 NOTE — Telephone Encounter (Signed)
 Copied from CRM [id number]. Topic: Appointments - Appointment Scheduling >> Mar 05, 2024  3:16 PM Nathanel BROCKS wrote:  Patient/patient representative is calling to schedule an appointment. Refer to attachments for appointment information.  Pt has a wound on his foot and several broken toes. He needs a hosp follow up and he is limited to having a ride. Please call pt and schedule a appt for him, the appts was to far out from what I could give.

## 2024-03-05 NOTE — Telephone Encounter (Signed)
 Pt already on Tramadol  for pain. Can take Tylenol  500 mg with the Tramadol  4x/day. Tylenol  p.m has Benadryl  in it which can cause drowsiness and may increase risk for fall in him.

## 2024-03-06 ENCOUNTER — Telehealth: Payer: Self-pay

## 2024-03-06 ENCOUNTER — Other Ambulatory Visit: Payer: Self-pay

## 2024-03-06 NOTE — Telephone Encounter (Signed)
 Copied from CRM [id number]. Topic: General - Other >> Mar 06, 2024 12:40 PM Hadassah PARAS wrote:  Reason for CRM: Pt is retuning a missed cb from Marvis Bradley, [facility]. Please call back #[phone]

## 2024-03-06 NOTE — Telephone Encounter (Signed)
 I spoke to the patient and informed him that if he chooses to have a visiting provider as his PCP and then changes his mind and wants to see Dr Vicci again, that will be fine.  Dr Vicci will accept him back.  He said he talked to one of the visiting providers yesterday but he really wants to stay with Dr Vicci. I explained to him that he should consider the visiting provider if he is having such difficulty getting out of the house and he said he just wants to stay with Dr Vicci.  He has an appointment at [facility] 03/15/2024 and he said he will have his son help him get down the stair for the appointment and then help him back up the stairs when he returns home.   Regarding the sutures in his foot, he said his foot is  doing good and he was told that the sutures will just  rot they don't need to be removed.    He spoke about his pain and I told him that Dr Vicci will not be prescribing anything else for pain and sleep,  he already has tramadol  and we are concerned about his falls.  He said he understood and he will talk to her about it at his appointment.

## 2024-03-06 NOTE — Telephone Encounter (Signed)
 I called the patient :[phone]  to inform him that Dr Vicci would take him back as a patient if he chose to have a home visiting provider and then changed his mind and wanted to see Dr Vicci again.   I had to leave a message requesting a call back.

## 2024-03-06 NOTE — Telephone Encounter (Signed)
 Spoke with patient wife. Verified DOB.  Relayed message from Dr. Vicci regarding medication.

## 2024-03-12 ENCOUNTER — Other Ambulatory Visit: Payer: Self-pay

## 2024-03-12 ENCOUNTER — Other Ambulatory Visit: Payer: Self-pay | Admitting: Internal Medicine

## 2024-03-12 DIAGNOSIS — G43009 Migraine without aura, not intractable, without status migrainosus: Secondary | ICD-10-CM

## 2024-03-12 MED ORDER — ALBUTEROL SULFATE HFA 108 (90 BASE) MCG/ACT IN AERS
2.0000 | INHALATION_SPRAY | RESPIRATORY_TRACT | 0 refills | Status: AC | PRN
  Filled 2024-03-12: qty 8.5, 17d supply, fill #0

## 2024-03-12 MED ORDER — SUMATRIPTAN SUCCINATE 50 MG PO TABS
ORAL_TABLET | ORAL | 0 refills | Status: DC
  Filled 2024-03-12: qty 10, 30d supply, fill #0

## 2024-03-13 ENCOUNTER — Other Ambulatory Visit: Payer: Self-pay

## 2024-03-14 ENCOUNTER — Telehealth: Payer: Self-pay | Admitting: Internal Medicine

## 2024-03-14 NOTE — Telephone Encounter (Signed)
 Confirmed appt for 11/20.

## 2024-03-15 ENCOUNTER — Other Ambulatory Visit: Payer: Self-pay

## 2024-03-15 ENCOUNTER — Ambulatory Visit: Attending: Internal Medicine | Admitting: Internal Medicine

## 2024-03-15 ENCOUNTER — Encounter: Payer: Self-pay | Admitting: Internal Medicine

## 2024-03-15 ENCOUNTER — Telehealth: Payer: Self-pay

## 2024-03-15 DIAGNOSIS — G894 Chronic pain syndrome: Secondary | ICD-10-CM

## 2024-03-15 DIAGNOSIS — M87052 Idiopathic aseptic necrosis of left femur: Secondary | ICD-10-CM

## 2024-03-15 DIAGNOSIS — E1169 Type 2 diabetes mellitus with other specified complication: Secondary | ICD-10-CM

## 2024-03-15 DIAGNOSIS — R0902 Hypoxemia: Secondary | ICD-10-CM

## 2024-03-15 DIAGNOSIS — R296 Repeated falls: Secondary | ICD-10-CM

## 2024-03-15 DIAGNOSIS — F17200 Nicotine dependence, unspecified, uncomplicated: Secondary | ICD-10-CM

## 2024-03-15 DIAGNOSIS — Z86718 Personal history of other venous thrombosis and embolism: Secondary | ICD-10-CM

## 2024-03-15 DIAGNOSIS — S92911D Unspecified fracture of right toe(s), subsequent encounter for fracture with routine healing: Secondary | ICD-10-CM

## 2024-03-15 DIAGNOSIS — R911 Solitary pulmonary nodule: Secondary | ICD-10-CM

## 2024-03-15 DIAGNOSIS — L602 Onychogryphosis: Secondary | ICD-10-CM

## 2024-03-15 DIAGNOSIS — J4489 Other specified chronic obstructive pulmonary disease: Secondary | ICD-10-CM

## 2024-03-15 LAB — GLUCOSE, POCT (MANUAL RESULT ENTRY): POC Glucose: 86 mg/dL (ref 70–99)

## 2024-03-15 LAB — POCT GLYCOSYLATED HEMOGLOBIN (HGB A1C): HbA1c, POC (controlled diabetic range): 6.5 % (ref 0.0–7.0)

## 2024-03-15 MED ORDER — TRAMADOL HCL 50 MG PO TABS
50.0000 mg | ORAL_TABLET | Freq: Four times a day (QID) | ORAL | 3 refills | Status: AC | PRN
  Filled 2024-03-15 – 2024-03-18 (×3): qty 120, 30d supply, fill #0
  Filled 2024-04-14: qty 120, 30d supply, fill #1
  Filled 2024-05-14: qty 120, 30d supply, fill #2

## 2024-03-15 MED ORDER — UMECLIDINIUM BROMIDE 62.5 MCG/ACT IN AEPB
1.0000 | INHALATION_SPRAY | Freq: Every day | RESPIRATORY_TRACT | 4 refills | Status: AC
  Filled 2024-03-15: qty 30, 30d supply, fill #0
  Filled 2024-04-14: qty 30, 30d supply, fill #1
  Filled 2024-05-14: qty 30, 30d supply, fill #2

## 2024-03-15 MED ORDER — FLUTICASONE-SALMETEROL 250-50 MCG/ACT IN AEPB
1.0000 | INHALATION_SPRAY | Freq: Two times a day (BID) | RESPIRATORY_TRACT | 6 refills | Status: AC
  Filled 2024-04-14: qty 60, 30d supply, fill #0

## 2024-03-15 NOTE — Telephone Encounter (Signed)
 Dr Vicci notified me that the patient's nebulizer is not working.  He received the nebulizer 05/2022 fro [employer]. I called [employer] : [phone] and spoke to Tia. I explained the problem with the nebulizer and informed her that the patient it not able to easily get out of the house to bring it to their store to be checked. Tia said she will submit a service ticket requesting someone contact the patient and arrange to go out to check the machine. She said they may not get out to see him today; but it should be tomorrow.  She went on to say that if they are not able to repair the machine, they will replace it with a new one.    I called the patient to explain my call with [employer] and had to leave him a message.  I left my call back number as well as the number for [employer].

## 2024-03-15 NOTE — Patient Instructions (Signed)
  VISIT SUMMARY: During today's visit, we discussed your ongoing health issues, including your left leg pain and mobility problems, recent foot injuries, chronic swelling in your left leg, COPD, and diabetes management. We also reviewed your smoking habits and the need for regular health screenings.  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 You should continue taking gabapentin  for pain management and have been referred to orthopedics for further evaluation. Weight loss was encouraged to improve your chances for potential surgery.  -UNSPECIFIED FRACTURE AND LACERATIONS OF RIGHT TOES, SUBSEQUENT ENCOUNTER WITH ROUTINE HEALING: You have fractures and lacerations on your right toes from a recent fall. These injuries are healing, but you still experience tenderness. You have been referred to orthopedics for further evaluation.  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 You will continue your anticoagulation therapy and be refitted for larger compression stockings.  -OTHER SPECIFIED CHRONIC OBSTRUCTIVE PULMONARY DISEASE (COPD) WITH HYPOXEMIA: COPD is a chronic lung disease that makes it hard to breathe. Your nebulizer is not working properly, and smoking worsens your condition. Home oxygen therapy has been ordered, and you will start using an Incruse inhaler daily. You have been referred to a medical case worker to address the nebulizer issues, and smoking cessation was strongly encouraged.  -SOLITARY PULMONARY NODULE, RIGHT LOWER LOBE: A solitary pulmonary nodule is a small, round growth in the lung that needs monitoring, especially given your smoking history. A repeat CT scan of your chest has been ordered to  monitor this nodule.  -TYPE 2 DIABETES MELLITUS, WELL CONTROLLED: Your diabetes is well controlled with an A1c of 6.5%. You should continue your current diabetes management regimen.  -NICOTINE  DEPENDENCE: You continue to smoke, which negatively impacts your COPD and overall health. Smoking cessation was strongly encouraged.  -ONYCHOGRYPHOSIS: This condition involves overgrown toenails, making self-care difficult. You have been referred to a podiatrist for toenail care.  -GENERAL HEALTH MAINTENANCE: Regular eye exams and colorectal cancer screening are important. You were encouraged to complete the Cologuard kit and use UPS for pickup. An eye exam referral was also discussed.  INSTRUCTIONS: Please follow up with orthopedics for your left femur and right toe injuries. Continue your anticoagulation therapy and get refitted for larger compression stockings. Start using the Incruse inhaler daily and follow up with the medical case worker regarding your nebulizer. Schedule a repeat CT scan of your chest to monitor the pulmonary nodule.  Maintain your current diabetes management regimen. Make an appointment with a podiatrist for toenail care. Complete the Cologuard kit and schedule an eye exam.                      Contains text generated by [employer]bridge.                                 Contains text generated by [employer]bridge.

## 2024-03-15 NOTE — Progress Notes (Signed)
 Patient ID: Joshua [name], male    DOB: 1955/09/06  MRN: [id number]  CC: Diabetes (DM & HTN f/u. Marikay of R thigh X3-6 mo radiating to L foot - limited walking /Repots stopping muscle relaxers due to increased falls)   Subjective: Joshua [name] is a 68 y.o. male who presents for chronic ds management. His concerns today include:  Patient with history of DM type II, HL with statin myopathy, HTN, PAD, BL PE/extensive DVT LT leg 10/2023, tobacco dependence, AVN LT hip/ OA LT knee, COPD spinal stenosis lumbar spine, migraines, GAD/schizoaffective bipolar type   Discussed the use of AI scribe software for clinical note transcription with the patient, who gave verbal consent to proceed.  History of Present Illness   Joshua [name] is a 68 year old male with avascular necrosis of the hip and COPD who presents with worsening left leg pain and mobility issues.  He experiences persistent and worsening pain in his left thigh and hip. He has know AVN of this hip and had massive DVT LLE in July of this yr. The pain severely limits his mobility, necessitating the use of a walker at home but can only walk several steps. Did get WC but hallway in his house is too clustered to allow for use. He experiences fatigue, and the leg often feels like it will give out, leading to falls. Methocarbamol  was previously used for muscle relaxation but was discontinued due to drowsiness and increased fall risk. Gabapentin  provides some pain relief with the Tramadol , but he feels the dose of the Gabapentin  may need adjustment as it does not fully alleviate his symptoms. Diclofenac  was previously used and was stopped after he was told it would not work with his blood thinner, Eliquis .  He recently sustained injuries to his right foot after a fall at home resulting in lacerations on plantar surface of the 3-5th toes and and fractures ( angulated transverse fracture of the small toe proximal phalanx at the  proximal metaphysealdiaphyseal junction and Lateral subluxation of the 4th toe distal phalanx with an associated chip fracture beneath the base of the distal phalanx, likely arising from the distal middle phalanx). He received five stitches for lacerations on the third, fourth, and fifth toes, which remain tender but much improved. The sutures were absorbable, and the foot is healing, though still tender. He was suppose to see ortho but he is not interested stated that he is not on his feet enough that it would matter.  DVT: He has a history of extensive blood clot in his left leg, resulting in chronic swelling, particularly in the thigh. Compression socks provided post-hospitalization were too tight and unusable. He continues to experience some swelling in the thigh, though it has reduced slightly.  Pox today when he came in was 86% RA. He has COPD and reports issues with his nebulizer machine, which he believes is not functioning properly. Reports weakness and difficulty breathing without nebulizer use. He uses an Advair  inhaler twice daily and has an albuterol  inhaler.  He continues to smoke and acknowledges the impact on his breathing. No chest pain, increased cough, or increased shortness of breath.   Had CTA of chest in ER 10/2023 which showed incidental finding of RLL 12 mm nodule. Radiologist recommended repeat imaging in 3 to 6 months.  Patient is agreeable to having it done.   DM:  Results for orders placed or performed in visit on 03/15/24  POCT glucose (manual entry)   Collection  Time: 03/15/24  3:35 PM  Result Value Ref Range   POC Glucose 86 70 - 99 mg/dl  POCT glycosylated hemoglobin (Hb A1C)   Collection Time: 03/15/24  3:37 PM  Result Value Ref Range   Hemoglobin A1C     HbA1c POC (<> result, manual entry)     HbA1c, POC (prediabetic range)     HbA1c, POC (controlled diabetic range) 6.5 0.0 - 7.0 %  Reports compliance with glargine insulin  38 units daily and Ozempic  2 mg once  a week.  He has lost 25 pounds over the past year, currently weighing 230 pounds,  He eats a varied diet, including candy bars, but is attempting to lose weight. Not checking BS regularly and tried CGM in past but did not like it.      Patient Active Problem List   Diagnosis Date Noted   AKI (acute kidney injury) 11/12/2023   Acute pulmonary embolism (HCC) 11/11/2023   Type 2 diabetes mellitus with morbid obesity (HCC) 08/30/2023   Narcotic use agreement exists 04/10/2023   Gait disturbance 04/10/2023   COPD (chronic obstructive pulmonary disease) (HCC) 08/03/2022   Statin myopathy 12/09/2021   Spinal stenosis of lumbar region 02/19/2020   Tobacco dependence 08/05/2019   Type 2 diabetes mellitus with hyperglycemia (HCC) 05/14/2019   Peripheral vascular disease due to secondary diabetes (HCC) 05/14/2019   Chronic low back pain with left-sided sciatica 05/14/2019   COPD with chronic bronchitis (HCC) 03/27/2019   Schizoaffective disorder (HCC) 07/24/2018   Chronic pain syndrome 10/14/2016   Osteoarthritis 10/14/2016   HTN (hypertension) 10/14/2016   Migraine headache without aura 10/14/2016   Schizoaffective disorder, bipolar type (HCC) 05/10/2016   Generalized anxiety disorder 05/04/2016     Current Outpatient Medications on File Prior to Visit  Medication Sig Dispense Refill   Accu-Chek FastClix Lancets MISC Use to check blood sugars three times per day 102 each 11   albuterol  (PROAIR  HFA) 108 (90 Base) MCG/ACT inhaler Inhale 2 puffs into the lungs every 4 (four) hours as needed for wheezing or shortness of breath. 8.5 g 0   amLODipine  (NORVASC ) 5 MG tablet Take 1 tablet (5 mg total) by mouth daily. 90 tablet 1   apixaban  (ELIQUIS ) 5 MG TABS tablet Take 1 tablet (5 mg total) by mouth 2 (two) times daily. Start after completion of starter pack. 60 tablet 4   blood glucose meter kit and supplies KIT Dispense based on patient and insurance preference. Use up to four times daily as  directed. ICD-10 E11.65  Z79.4 1 each 0   Blood Glucose Monitoring Suppl (ACCU-CHEK GUIDE ME) w/Device KIT Use to check blood sugars up to 3 times per day. ICD-10 E11.65, Z79.4 1 kit 0   cetirizine  (ZYRTEC ) 10 MG tablet Take 1 tablet (10 mg total) by mouth daily. 30 tablet 6   gabapentin  (NEURONTIN ) 300 MG capsule Take 2 capsules (600 mg total) by mouth 3 (three) times daily. 180 capsule 6   glucose blood (ACCU-CHEK GUIDE) test strip Use as instructed to check blood sugars three times per day. 100 each 11   insulin  glargine (LANTUS ) 100 unit/mL SOPN Inject 38 Units into the skin at bedtime. 15 mL 4   insulin  lispro (HUMALOG  KWIKPEN) 100 UNIT/ML KwikPen Inject 10 Units into the skin 3 (three) times daily. 15 mL 3   Insulin  Pen Needle (PEN NEEDLES) 32G X 4 MM MISC Use to inject insulin  four times daily. Also, Ozempic  once weekly. 100 each 6   ipratropium-albuterol  (  DUONEB) 0.5-2.5 (3) MG/3ML SOLN Take 3 mLs by nebulization every 6 (six) hours as needed (shortness of breath). 180 mL 1   Lancets Misc. (ACCU-CHEK FASTCLIX LANCET) KIT Use to check blood sugars 3 times per day 1 kit 11   omeprazole  (PRILOSEC) 20 MG capsule Take 1 capsule (20 mg total) by mouth daily. 90 capsule 1   paliperidone  (INVEGA ) 6 MG 24 hr tablet Take 6 mg by mouth at bedtime.     Semaglutide , 2 MG/DOSE, (OZEMPIC , 2 MG/DOSE,) 8 MG/3ML SOPN Inject 2 mg as directed once a week. 9 mL 2   SUMAtriptan  (IMITREX ) 50 MG tablet Take 1 tablet by mouth at the start of headache.  May repeat in 2 hours if headache persists. Max 2 tabs/24hr. 10 tablet 0   valsartan -hydrochlorothiazide  (DIOVAN -HCT) 320-25 MG tablet Take 1 tablet by mouth daily. 90 tablet 3   Blood Pressure Monitoring (BLOOD PRESSURE KIT) DEVI Use to measure blood pressure 1 each 0   diclofenac  (VOLTAREN ) 50 MG EC tablet Take 1 tablet (50 mg total) by mouth 2 (two) times daily as needed for moderate pain. Eat before taking the medicine. 60 tablet 2   Respiratory Therapy Supplies  (FLUTTER) DEVI Use with 4 times daily (Patient not taking: Reported on 11/22/2023) 1 each 0   Spacer/Aero-Holding Chambers (AEROCHAMBER MV) inhaler Use as instructed 1 each 0   No current facility-administered medications on file prior to visit.    Allergies  Allergen Reactions   Statins Other (See Comments)    Myopathy   Haldol [Haloperidol] Other (See Comments)    Unk reaction   Metformin  And Related Diarrhea and Nausea And Vomiting   Trazodone  And Nefazodone Itching    Social History   Socioeconomic History   Marital status: Married    Spouse name: Not on file   Number of children: Not on file   Years of education: Not on file   Highest education level: 9th grade  Occupational History   Not on file  Tobacco Use   Smoking status: Every Day    Current packs/day: 1.00    Average packs/day: 1 pack/day for 50.0 years (50.0 ttl pk-yrs)    Types: Cigarettes   Smokeless tobacco: Never  Vaping Use   Vaping status: Never Used  Substance and Sexual Activity   Alcohol use: Not Currently   Drug use: Yes    Types: Marijuana    Comment: have changed to hemp now 09/17/17   Sexual activity: Yes  Other Topics Concern   Not on file  Social History Narrative   Not on file   Social Drivers of Health   Financial Resource Strain: Low Risk  (03/15/2024)   Overall Financial Resource Strain (CARDIA)    Difficulty of Paying Living Expenses: Not hard at all  Food Insecurity: No Food Insecurity (03/15/2024)   Hunger Vital Sign    Worried About Running Out of Food in the Last Year: Never true    Ran Out of Food in the Last Year: Never true  Transportation Needs: No Transportation Needs (03/15/2024)   PRAPARE - Administrator, Civil Service (Medical): No    Lack of Transportation (Non-Medical): No  Physical Activity: Inactive (03/15/2024)   Exercise Vital Sign    Days of Exercise per Week: 0 days    Minutes of Exercise per Session: 0 min  Stress: No Stress Concern Present  (03/15/2024)   Joshua [name]    Feeling of  Stress: Not at all  Social Connections: Moderately Isolated (03/14/2024)   Social Connection and Isolation Panel    Frequency of Communication with Friends and Family: More than three times a week    Frequency of Social Gatherings with Friends and Family: More than three times a week    Attends Religious Services: Never    Database Administrator or Organizations: No    Attends Engineer, Structural: Not on file    Marital Status: Married  Catering Manager Violence: Not At Risk (03/15/2024)   Humiliation, Afraid, Rape, and Kick [name]    Fear of Current or Ex-Partner: No    Emotionally Abused: No    Physically Abused: No    Sexually Abused: No    Family History  Problem Relation Age of Onset   Diabetes Mother    Cancer Mother        unsure what type   Heart disease Mother    Hypertension Mother    Dementia Father    Diabetes Brother    Dementia Maternal Grandfather    Cancer Paternal Grandmother 55       breast   Mental illness Neg Hx     Past Surgical History:  Procedure Laterality Date   APPENDECTOMY  1970    ROS: Review of Systems Negative except as stated above  PHYSICAL EXAM: BP 121/75 (BP Location: Left Arm, Patient Position: Sitting, Cuff Size: Large)   Pulse 90   Temp 98.3 F (36.8 C) (Oral)   Ht 5' 9 (1.753 m)   Wt 230 lb (104.3 kg)   SpO2 (!) 86%   BMI 33.97 kg/m   Physical Exam Pox 86 % rest on RA Pox 94-97% RA on 2L O2 O2 removed and oxygen dropped to 86% after 3 mins  General appearance - alert, elderly Caucasian male who looks older than stated age.  He is in NAD sitting in wheelchair. Mental status -patient is alert.  He answers questions appropriately Neck - supple, no significant adenopathy Chest -good air entry.  Bilateral scattered rhonchi and wheezes. Heart - normal rate, regular rhythm, normal S1, S2, no murmurs,  rubs, clicks or gallops Extremities -skin on extremities are very dry and scaly.  Mild edema of the feet.  Size of the left thigh larger than the right. Right foot: Healed lacerations of the 3rd, 4th and 5th toes dorsal surface.  No sutures noted. Toenails thick and overgrown    Latest Ref Rng & Units 11/28/2023   12:00 AM 11/22/2023   11:39 AM 11/13/2023   11:00 AM  CMP  Glucose 70 - 99 mg/dL 894  858  889   BUN 8 - 27 mg/dL 30  27  23    Creatinine 0.76 - 1.27 mg/dL 8.64  8.73  8.84   Sodium 134 - 144 mmol/L 132  137  134   Potassium 3.5 - 5.2 mmol/L 5.0  5.7  4.3   Chloride 96 - 106 mmol/L 94  100  100   CO2 20 - 29 mmol/L 21  21  20    Calcium  8.6 - 10.2 mg/dL 9.3  9.3  9.3    Lipid Panel     Component Value Date/Time   CHOL 156 11/22/2023 1139   TRIG 112 11/22/2023 1139   HDL 52 11/22/2023 1139   CHOLHDL 3.0 11/22/2023 1139   CHOLHDL 3.3 09/30/2016 0651   VLDL 35 09/30/2016 0651   LDLCALC 84 11/22/2023 1139    CBC    Component  Value Date/Time   WBC 9.1 11/22/2023 1139   WBC 6.7 11/13/2023 1100   RBC 5.43 11/22/2023 1139   RBC 5.03 11/13/2023 1100   HGB 17.0 11/22/2023 1139   HCT 51.2 (H) 11/22/2023 1139   PLT 185 11/22/2023 1139   MCV 94 11/22/2023 1139   MCH 31.3 11/22/2023 1139   MCH 30.8 11/13/2023 1100   MCHC 33.2 11/22/2023 1139   MCHC 33.7 11/13/2023 1100   RDW 14.7 11/22/2023 1139   LYMPHSABS 1.7 11/11/2023 2055   LYMPHSABS 2.7 07/09/2021 1630   MONOABS 0.7 11/11/2023 2055   EOSABS 0.3 11/11/2023 2055   EOSABS 0.1 07/09/2021 1630   BASOSABS 0.1 11/11/2023 2055   BASOSABS 0.1 07/09/2021 1630    ASSESSMENT AND PLAN: 1. Recurrent falls Patient with recurrent falls.  He identifies methocarbamol  as contributing and has discontinued taking it.  This has been taken off his med list. Patient has significant decreased mobility due to arthritis in the knees and AVN of the left hip.  I had referred to orthopedics before to have a conversation about this.  He may  not be the best surgical candidate but at least he can still talk with Ortho and see what they would recommend.  Patient wants to hold off on this for now stating that he wants to lose more weight first. - Recommend home physical therapy again but patient declines.  He states they had left him with information for him to do stretching exercises with his legs and he can do that on his own. - Encouraged him to use his walker every time he gets up.  It would have been good for him to have use of his wheelchair.  Encouraged him to have a family member clear his hall way to allow for use of his WC.  2. Type 2 diabetes mellitus with morbid obesity (HCC) (Primary) Controlled.  Continue Basaglar  38 units daily and Ozempic .  Encouraged him to eliminate the sugary snacks/candy from his diet - POCT glycosylated hemoglobin (Hb A1C) - POCT glucose (manual entry) - Comprehensive metabolic panel with GFR - CBC  3. Avascular necrosis of hip, left (HCC) 4. Chronic pain syndrome Continue tramadol  as needed.  Continue gabapentin . Again discussed referring him to pain management but patient declined stating he does not want anything stronger for pain. - traMADol  (ULTRAM ) 50 MG tablet; Take 1 tablet (50 mg total) by mouth every 6 (six) hours as needed for moderate pain (pain score 4-6) or severe pain (pain score 7-10).  Dispense: 120 tablet; Refill: 3  5. Tobacco dependence Strongly advised to quit.  He is not ready to give a trial of quitting.  6. COPD with chronic bronchitis (HCC) I will have the caseworker check with [facility] to see if they can look at his nebulizer machine to determine whether it can be fixed or he needs a new one.  He has had this less than 2 years. Continue Advair  inhaler. Add Incruse inhaler. - fluticasone -salmeterol (ADVAIR ) 250-50 MCG/ACT AEPB; Inhale 1 puff into the lungs 2 (two) times daily.  Dispense: 60 each; Refill: 6 - umeclidinium bromide (INCRUSE ELLIPTA) 62.5 MCG/ACT AEPB;  Inhale 1 puff into the lungs daily.  Dispense: 30 each; Refill: 4 - For home use only DME oxygen  7. Lung nodule, solitary Will need CAT scan for follow-up on this.  He is at high risk for lung cancer given his years of smoking. - CT Chest Wo Contrast; Future  8. Overgrown toenails - Ambulatory referral  to Podiatry  9. History of DVT of lower extremity Continue Eliquis .  Would need lifelong due to increased risk of recurrent DVT due to impaired mobility, obesity and smoking.  Advised of the importance of using his walker and trying to get use of wheelchair to help prevent falls   10. Closed nondisplaced fracture of phalanx of toe of right foot with routine healing, unspecified toe, subsequent encounter Looks like this is healing.  Patient declines referral to Ortho  11. Hypoxia He would benefit from home O2 2 L continuous.  Prescription will be sent to [facility].  I have warned the patient that he should not smoke around the oxygen tank to prevent explosion.  Hypoxia due to COPD. - For home use only DME oxygen   Patient was given the opportunity to ask questions.  Patient verbalized understanding of the plan and was able to repeat key elements of the plan.  I spent 46 minutes dedicated to the care of this patient today to include previsit review of chart including recent ER visit and imaging studies that were done there, my recent office notes, face-to-face time with patient obtaining history, doing physical exam and discussing assessment and plan, post visit entering of orders, coordinating care with case worker regarding the oxygen and his nebulizer machine and charting the visit.  This documentation was completed using Paediatric nurse.  Any transcriptional errors are unintentional.  Orders Placed This Encounter  Procedures   For home use only DME oxygen   CT Chest Wo Contrast   Comprehensive metabolic panel with GFR   CBC   Ambulatory referral to Podiatry    POCT glycosylated hemoglobin (Hb A1C)   POCT glucose (manual entry)     Requested Prescriptions   Signed Prescriptions Disp Refills   fluticasone -salmeterol (ADVAIR ) 250-50 MCG/ACT AEPB 60 each 6    Sig: Inhale 1 puff into the lungs 2 (two) times daily.   umeclidinium bromide (INCRUSE ELLIPTA) 62.5 MCG/ACT AEPB 30 each 4    Sig: Inhale 1 puff into the lungs daily.   traMADol  (ULTRAM ) 50 MG tablet 120 tablet 3    Sig: Take 1 tablet (50 mg total) by mouth every 6 (six) hours as needed for moderate pain (pain score 4-6) or severe pain (pain score 7-10).    Return in about 4 months (around 07/13/2024).  Barnie Louder, MD, FACP

## 2024-03-16 ENCOUNTER — Other Ambulatory Visit: Payer: Self-pay

## 2024-03-16 ENCOUNTER — Ambulatory Visit: Payer: Self-pay | Admitting: Internal Medicine

## 2024-03-16 DIAGNOSIS — E871 Hypo-osmolality and hyponatremia: Secondary | ICD-10-CM

## 2024-03-16 LAB — CBC
Hematocrit: 48.9 % (ref 37.5–51.0)
Hemoglobin: 15.5 g/dL (ref 13.0–17.7)
MCH: 28.7 pg (ref 26.6–33.0)
MCHC: 31.7 g/dL (ref 31.5–35.7)
MCV: 91 fL (ref 79–97)
Platelets: 176 x10E3/uL (ref 150–450)
RBC: 5.4 x10E6/uL (ref 4.14–5.80)
RDW: 14.5 % (ref 11.6–15.4)
WBC: 7.6 x10E3/uL (ref 3.4–10.8)

## 2024-03-16 LAB — COMPREHENSIVE METABOLIC PANEL WITH GFR
ALT: 11 IU/L (ref 0–44)
AST: 12 IU/L (ref 0–40)
Albumin: 4.1 g/dL (ref 3.9–4.9)
Alkaline Phosphatase: 109 IU/L (ref 47–123)
BUN/Creatinine Ratio: 16 (ref 10–24)
BUN: 17 mg/dL (ref 8–27)
Bilirubin Total: 0.4 mg/dL (ref 0.0–1.2)
CO2: 30 mmol/L — ABNORMAL HIGH (ref 20–29)
Calcium: 9.1 mg/dL (ref 8.6–10.2)
Chloride: 89 mmol/L — ABNORMAL LOW (ref 96–106)
Creatinine, Ser: 1.08 mg/dL (ref 0.76–1.27)
Globulin, Total: 2.3 g/dL (ref 1.5–4.5)
Glucose: 74 mg/dL (ref 70–99)
Potassium: 4.9 mmol/L (ref 3.5–5.2)
Sodium: 131 mmol/L — ABNORMAL LOW (ref 134–144)
Total Protein: 6.4 g/dL (ref 6.0–8.5)
eGFR: 76 mL/min/1.73 (ref >59)

## 2024-03-16 NOTE — Progress Notes (Signed)
 Let patient know that his kidney function has improved which is good.  Sodium level is mildly low.  If possible, please return to the lab in about a week to have this rechecked.  If still low, we will have to adjust the dose of the valsartan /hydrochlorothiazide  as the hydrochlorothiazide  component can affect sodium level. Liver function test is good. Blood cell counts are normal.

## 2024-03-19 ENCOUNTER — Other Ambulatory Visit: Payer: Self-pay

## 2024-03-20 ENCOUNTER — Telehealth: Payer: Self-pay | Admitting: Internal Medicine

## 2024-03-20 ENCOUNTER — Ambulatory Visit: Payer: Self-pay

## 2024-03-20 NOTE — Telephone Encounter (Signed)
 Do not increase salt intake.

## 2024-03-20 NOTE — Telephone Encounter (Signed)
 FYI Only or Action Required?: FYI only for provider: results reviewed.  Patient was last seen in primary care on 03/15/2024 by Joshua Barnie NOVAK, MD.  Called Nurse Triage reporting Results   Copied from CRM [id number]. Topic: Clinical - Medical Advice >> Mar 20, 2024  4:32 PM Joshua [name] wrote: Reason for CRM: Patient would like to know if he can eat salt to improve his low sodium level  Callback #: [phone] >> Mar 20, 2024  4:51 PM Joshua [name] wrote: Pt returning call for NT. Warm transfer to NT Reason for Disposition  Health information question, no triage required and triager able to answer question  Answer Assessment - Initial Assessment Questions 1. REASON FOR CALL: What is the main reason for your call? or How can I best help you?     Reviewed results with patient- provider advised to re-check labs in one week. Patient asking if he should increase his sodium content over the next week to bring it up. Advised to keep his diet consistent.The provider would want to make a real time adjustment instead of reacting to a short term sodium increase. Patient understands. No further questions at this time  Protocols used: Information Only Call - No Triage-A-AH

## 2024-03-20 NOTE — Telephone Encounter (Signed)
 Patient called-voicemail left to call back to Nurse Triage.   Copied from CRM #[id number]. Topic: Clinical - Medical Advice >> Mar 20, 2024  4:32 PM Joshua [name] wrote: Reason for CRM: Patient would like to know if he can eat salt to improve his low sodium level  Callback #: [phone]

## 2024-03-21 NOTE — Telephone Encounter (Signed)
 Called & spoke to the patient. Verified name & DOB. Informed that per Dr.Johnson to not increase salt intake. Patient expressed verbal understanding. No further questions / concerns.

## 2024-03-29 ENCOUNTER — Other Ambulatory Visit

## 2024-03-29 NOTE — Telephone Encounter (Signed)
 I called the patient to inquire if his nebulizer was repaired or replaced and I had to leave a message requesting a call back.  I then called [facility]: [phone] and spoke to Naomie who said that a technician went out to see the patient 03/16/2024 but she could not determine if the machine was repaired or replaced.

## 2024-03-29 NOTE — Telephone Encounter (Signed)
 The patient called me back and told me that [facility] gave him a new nebulizer

## 2024-04-02 ENCOUNTER — Ambulatory Visit

## 2024-04-04 ENCOUNTER — Inpatient Hospital Stay: Admission: RE | Admit: 2024-04-04 | Discharge: 2024-04-04 | Attending: Internal Medicine | Admitting: Internal Medicine

## 2024-04-04 ENCOUNTER — Ambulatory Visit: Admitting: Podiatry

## 2024-04-04 DIAGNOSIS — R911 Solitary pulmonary nodule: Secondary | ICD-10-CM

## 2024-04-05 ENCOUNTER — Telehealth: Payer: Self-pay | Admitting: Internal Medicine

## 2024-04-05 NOTE — Telephone Encounter (Signed)
 CT chest was done yesterday. It has not been read as yet by the radiologist. Will call when resulted.

## 2024-04-05 NOTE — Telephone Encounter (Signed)
 Copied from CRM #[id number]. Topic: Clinical - Lab/Test Results >> Apr 05, 2024  1:35 PM Montie POUR wrote:  Reason for CRM:  Todd is calling to check on the CT Chest  imaging. Please call him at [phone]. I let him know that Dr. Vicci has not review CT yet.

## 2024-04-06 NOTE — Telephone Encounter (Signed)
 Called but no answer. LVM to call back.

## 2024-04-09 ENCOUNTER — Other Ambulatory Visit: Payer: Self-pay

## 2024-04-09 ENCOUNTER — Other Ambulatory Visit: Payer: Self-pay | Admitting: Internal Medicine

## 2024-04-09 ENCOUNTER — Ambulatory Visit: Payer: Self-pay

## 2024-04-09 DIAGNOSIS — G43009 Migraine without aura, not intractable, without status migrainosus: Secondary | ICD-10-CM

## 2024-04-09 MED ORDER — SUMATRIPTAN SUCCINATE 50 MG PO TABS
ORAL_TABLET | ORAL | 2 refills | Status: AC
  Filled 2024-04-09: qty 10, 30d supply, fill #0
  Filled 2024-05-14: qty 10, 30d supply, fill #1

## 2024-04-09 NOTE — Telephone Encounter (Signed)
 FYI Only or Action Required?: Action required by provider: clinical question for provider, update on patient condition, and please call patient to offer appointment.  No appts. Available in December.  Prefers to see Dr. Vicci.  Patient was last seen in primary care on 03/15/2024 by [name], MD.  Called Nurse Triage reporting Leg Pain.  Symptoms began several months ago.  Interventions attempted: Prescription medications:  SABRA  Symptoms are: gradually worsening.  Triage Disposition: See PCP When Office is Open (Within 3 Days)  Patient/caregiver understands and will follow disposition?: No, wishes to speak with PCP  Copied from CRM #[id number]. Topic: Clinical - Red Word Triage >> Apr 09, 2024  5:24 PM Jayma L wrote: Red Word that prompted transfer to Nurse Triage:  leg pain in left leg . Pain is getting worse and its very bad pain. Cant walk Reason for Disposition  [1] MODERATE pain (e.g., interferes with normal activities, limping) AND [2] present > 3 days  Answer Assessment - Initial Assessment Questions 1. ONSET: When did the pain start?      A couple of weeks ago 2. LOCATION: Where is the pain located?      Hip joint and upper thigh, knee joint pain, foot pain.  Whole leg is in terrible pain Reports left back pain and sciatic nerve pain 3. PAIN: How bad is the pain?    (Scale 1-10; or mild, moderate, severe)     10/10 when walking, 3/10 at rest 4. WORK OR EXERCISE: Has there been any recent work or exercise that involved this part of the body?      denies 5. CAUSE: What do you think is causing the leg pain?     Unsure.  6. OTHER SYMPTOMS: Do you have any other symptoms? (e.g., chest pain, back pain, breathing difficulty, swelling, rash, fever, numbness, weakness)     Denies all of these except for back pain  Protocols used: Leg Pain-A-AH

## 2024-04-09 NOTE — Telephone Encounter (Signed)
 Called & spoke to the patient. Verified name & DOB. Informed that results have not been read yet by the radiologist and are still awaiting for results. Advised that it may take several more days before it is read but will call with results as soon as it comes in. Patient expressed verbal understanding.

## 2024-04-11 NOTE — Telephone Encounter (Signed)
 Leg/knee pain/foot: Patient's pain is multifactorial.  He had massive blood clot in the left leg several months ago has avascular necrosis of the left hip  and arthritis in the knee.  He is aware of all of these diagnoses and I have discussed them with him.  He is currently on tramadol .  I have offered referral for pain management several times which he has declined.  Let him know again I am suggesting referral to pain management. If he has changed his mind, please let me know. -I will also request follow-up appointment with vein and vascular to see if any procedure can be done in regards to the clot.  Continue Eliquis   CT of the chest: Nodule/spot that was seen in the right lung has resolved.  Incidental finding was a spot seen in his pancreas (organ that sits behind the stomach) that is unchanged from a scan that was done this past summer.  Radiologist recommends referral to gastroenterology to have this evaluated further to see if this is a cyst versus a cancerous lesion.  Referral submitted.

## 2024-04-11 NOTE — Telephone Encounter (Signed)
 Joshua [name] states his leg pain feels better today. He hasn't done much walking.  Advised him of Dr. Ferdie message-   Leg/knee pain/foot: Patient's pain is multifactorial.  He had massive blood clot in the left leg several months ago has avascular necrosis of the left hip  and arthritis in the knee.  He is aware of all of these diagnoses and I have discussed them with him.  He is currently on tramadol .  I have offered referral for pain management several times which he has declined.  Let him know again I am suggesting referral to pain management. If he has changed his mind, please let me know. -I will also request follow-up appointment with vein and vascular to see if any procedure can be done in regards to the clot.  Continue Eliquis   He states his experience at the pain management specialist in the past was not helpful for him.  Informed that he could be sent to a different site.  He wishes not to f/u with pain management.  He states he will call office back if he changes his mind. He states the tramadol  is helping and that he doesn't want to be given anything strong.

## 2024-04-11 NOTE — Telephone Encounter (Signed)
 Called & spoke to the patient. Verified name & DOB. Informed of Dr.Johnson's message as follows:  Leg/knee pain/foot: Patient's pain is multifactorial.  He had massive blood clot in the left leg several months ago has avascular necrosis of the left hip  and arthritis in the knee.  He is aware of all of these diagnoses that has previously been discussed for him. He is currently on tramadol  but continues to decline a referral to pain management. Informed patient that if he changes his mind to call and let us  know.   Informed that a follow-up appointment with vein and vascular has been submitted to see if any procedure can be done in regards to the clot.  Continue Eliquis    CT of the chest: Nodule/spot that was seen in the right lung has resolved.  Incidental finding was a spot seen in his pancreas (organ that sits behind the stomach) that is unchanged from a scan that was done this past summer.  Radiologist recommends referral to gastroenterology to have this evaluated further to see if this is a cyst versus a cancerous lesion.  Referral submitted.   Patient expressed verbal understanding of all discussed.

## 2024-04-12 ENCOUNTER — Other Ambulatory Visit: Payer: Self-pay

## 2024-04-12 ENCOUNTER — Other Ambulatory Visit: Payer: Self-pay | Admitting: *Deleted

## 2024-04-12 DIAGNOSIS — M79605 Pain in left leg: Secondary | ICD-10-CM

## 2024-04-16 ENCOUNTER — Other Ambulatory Visit: Payer: Self-pay

## 2024-04-20 ENCOUNTER — Ambulatory Visit: Payer: Self-pay

## 2024-04-20 NOTE — Telephone Encounter (Signed)
 Slater, please see pt's message. Let him know he will need to be seen. Should also consider Home Health as well to assist with care; I do not remember if he has one or not.

## 2024-04-20 NOTE — Telephone Encounter (Signed)
 FYI Only or Action Required?: Action required by provider: clinical question for provider and update on patient condition.  Patient was last seen in primary care on 03/15/2024 by [name], MD.  Called Nurse Triage reporting Pressure Ulcer.  Symptoms began several months ago.  Interventions attempted: Nothing.  Symptoms are: gradually worsening.  Triage Disposition: See Physician Within 24 Hours  Patient/caregiver understands and will follow disposition?: No, wishes to speak with PCP            Copied from CRM #[id number]. Topic: Clinical - Red Word Triage >> Apr 20, 2024  1:04 PM Charlet HERO wrote: Red Word that prompted transfer to Nurse Triage: Patient is calling bc he has spots on his body that are 4 inches round and about a inch raised on his buttocks and heal that has a holes they are hurting. Dr Vicci he is asking for meds for them. Reason for Disposition  Large sore (> 1 inch or 2.5 cm across)  Answer Assessment - Initial Assessment Questions 1. APPEARANCE of SORES: What do the sores look like?     Dark red, kinda bubbled up circle about 4 inches on tailbone, not an open wound states it feels like a soft callous/thick. Right heel looks like dry skin, about the size of 2 silver dollars.  2. NUMBER: How many sores are there?     2 on tailbone, one on right heel.  3. SIZE: How big is the largest sore?     Tailbone, 4 inches.  4. LOCATION: Where are the sores located?     Tailbone, right heel.  5. ONSET: When did the sores begin?     Several months.  6. TENDER: Does it hurt when you touch it?  (Scale 1-10; or mild, moderate, severe)      Yes; 8-9/10 when moving his right leg and to lie on his back/bottom.  7. CAUSE: What do you think is causing the sores?     Pressure ulcer. Patient states he is mostly bedbound and uses a wheelchair at home. He states for a year or more he has been mostly bed bound and has to lie on his back.  8. OTHER  SYMPTOMS: Do you have any other symptoms? (e.g., fever, new weakness)     No fever.  Protocols used: [employer]

## 2024-04-23 ENCOUNTER — Ambulatory Visit: Payer: Self-pay | Attending: Internal Medicine | Admitting: Internal Medicine

## 2024-04-23 ENCOUNTER — Other Ambulatory Visit: Payer: Self-pay | Admitting: Pharmacist

## 2024-04-23 ENCOUNTER — Other Ambulatory Visit: Payer: Self-pay

## 2024-04-23 ENCOUNTER — Ambulatory Visit (HOSPITAL_COMMUNITY)

## 2024-04-23 VITALS — BP 103/68 | HR 97 | Temp 98.3°F

## 2024-04-23 DIAGNOSIS — Z23 Encounter for immunization: Secondary | ICD-10-CM

## 2024-04-23 DIAGNOSIS — K869 Disease of pancreas, unspecified: Secondary | ICD-10-CM | POA: Diagnosis not present

## 2024-04-23 DIAGNOSIS — M87052 Idiopathic aseptic necrosis of left femur: Secondary | ICD-10-CM

## 2024-04-23 DIAGNOSIS — Z9981 Dependence on supplemental oxygen: Secondary | ICD-10-CM | POA: Diagnosis not present

## 2024-04-23 DIAGNOSIS — J9611 Chronic respiratory failure with hypoxia: Secondary | ICD-10-CM | POA: Diagnosis not present

## 2024-04-23 DIAGNOSIS — L989 Disorder of the skin and subcutaneous tissue, unspecified: Secondary | ICD-10-CM | POA: Diagnosis not present

## 2024-04-23 DIAGNOSIS — L0231 Cutaneous abscess of buttock: Secondary | ICD-10-CM

## 2024-04-23 DIAGNOSIS — M1712 Unilateral primary osteoarthritis, left knee: Secondary | ICD-10-CM | POA: Diagnosis not present

## 2024-04-23 MED ORDER — SULFAMETHOXAZOLE-TRIMETHOPRIM 800-160 MG PO TABS
1.0000 | ORAL_TABLET | Freq: Two times a day (BID) | ORAL | 0 refills | Status: DC
  Filled 2024-04-23: qty 14, 7d supply, fill #0

## 2024-04-23 MED ORDER — MUPIROCIN 2 % EX OINT
TOPICAL_OINTMENT | CUTANEOUS | 0 refills | Status: AC
  Filled 2024-04-23: qty 22, 11d supply, fill #0

## 2024-04-23 MED ORDER — MUPIROCIN CALCIUM 2 % EX CREA
1.0000 | TOPICAL_CREAM | Freq: Two times a day (BID) | CUTANEOUS | 0 refills | Status: DC
  Filled 2024-04-23: qty 15, 8d supply, fill #0

## 2024-04-23 NOTE — Patient Instructions (Signed)
" °  VISIT SUMMARY: Today, you were seen for a painful nodule on your right buttock that recently drained pus and blood. You also have a sore on your right upper arm that has been present for about a year and has increased in size and pain. Additionally, we discussed your general health maintenance and administered a flu shot.  YOUR PLAN: -CUTANEOUS ABSCESS OF RIGHT BUTTOCK: A cutaneous abscess is a collection of pus that has built up within the tissue of the body. You have a chronic nodule on your right buttock that recently became enlarged and painful, and it drained pus and blood. We prescribed antibiotics to treat any potential infection. If the swelling worsens, please seek emergency care as it may need to be drained by a healthcare professional.  -CUTANEOUS ABSCESS OF LEFT BUTTOCK: A cutaneous abscess is a collection of pus that has built up within the tissue of the body. You have a small, firm nodule on your left buttock that is not currently draining. We prescribed antibiotics to treat any potential infection.  -CHRONIC SKIN LESION OF RIGHT UPPER ARM, SUSPICIOUS FOR MALIGNANCY: A chronic skin lesion is an area of skin that has been damaged and does not heal properly. The sore on your right upper arm has increased in size and pain, and it is suspicious for skin cancer. We referred you to a dermatologist for further evaluation and possible biopsy.  -GENERAL HEALTH MAINTENANCE: We discussed your general health maintenance and administered a flu shot to help protect you from the flu this season.  INSTRUCTIONS: Please take the prescribed antibiotics as directed. If the swelling on your right buttock worsens, seek emergency care. Follow up with the dermatologist for the evaluation of the sore on your right upper arm. Continue to use your Incruse inhaler and nebulizer as needed for your respiratory condition. Schedule a follow-up appointment as needed.                      Contains  text generated by [employer].                                 Contains text generated by [employer].   "

## 2024-04-23 NOTE — Progress Notes (Unsigned)
 "   Patient ID: Joshua [name], male    DOB: 20-May-1955  MRN: [id number]  CC: Wound Check (Wound check - ulcer bursted today with blood & pus Honora on R arm & R heel Janiece to flu vax)   Subjective: Joshua [name] is a 68 y.o. male who presents for UC visit. His chronic active issues include:  Patient with history of DM type II, HL with statin myopathy, HTN, PAD, BL PE/extensive DVT LT leg 10/2023, tobacco dependence, AVN LT hip/ OA LT knee, COPD spinal stenosis lumbar spine, migraines, GAD/schizoaffective bipolar type   Discussed the use of AI scribe software for clinical note transcription with the patient, who gave verbal consent to proceed.  History of Present Illness Joshua [name] is a 68 year old male who presents with a painful nodule on the right buttock that has recently drained pus and blood.  He has had a nodule on his right buttock for several years, which became problematic in the past three weeks. During this period, the nodule increased in size and caused a burning sensation, described as 'wanting to bust open'. This morning, after sitting on the toilet, he noticed a significant amount of pus and blood, which he believes came from the nodule. The pain is severe, especially when sitting, and was throbbing like a 'toothache' before it drained. No fever or recent illness is reported, and he maintains a good diet.  He experiences significant pain and difficulty with mobility, using a cane for assistance. He attributes some of his mobility issues to a hip replacement that needs to be done. He has frequent falls due to left leg pain.  He also has a sore on his right upper arm present for about a year. Initially appearing as a hard, crusty spot, it was pulled off, resulting in a chunk coming with it. The area has since been painful and increased in size to approximately 3 by 3 centimeters. He thought at first it might be related to insulin  injections, as it is in the same area  where he receives them.  He uses an Incruse inhaler and a nebulizer for his respiratory condition but does not use his oxygen despite having it available. No known allergies to antibiotics.    Patient Active Problem List   Diagnosis Date Noted   AKI (acute kidney injury) 11/12/2023   Acute pulmonary embolism (HCC) 11/11/2023   Type 2 diabetes mellitus with morbid obesity (HCC) 08/30/2023   Narcotic use agreement exists 04/10/2023   Gait disturbance 04/10/2023   COPD (chronic obstructive pulmonary disease) (HCC) 08/03/2022   Statin myopathy 12/09/2021   Spinal stenosis of lumbar region 02/19/2020   Tobacco dependence 08/05/2019   Type 2 diabetes mellitus with hyperglycemia (HCC) 05/14/2019   Peripheral vascular disease due to secondary diabetes (HCC) 05/14/2019   Chronic low back pain with left-sided sciatica 05/14/2019   COPD with chronic bronchitis (HCC) 03/27/2019   Schizoaffective disorder (HCC) 07/24/2018   Chronic pain syndrome 10/14/2016   Osteoarthritis 10/14/2016   HTN (hypertension) 10/14/2016   Migraine headache without aura 10/14/2016   Schizoaffective disorder, bipolar type (HCC) 05/10/2016   Generalized anxiety disorder 05/04/2016     Medications Ordered Prior to Encounter[1]  Allergies[2]  Social History   Socioeconomic History   Marital status: Married    Spouse name: Not on file   Number of children: Not on file   Years of education: Not on file   Highest education level: 9th grade  Occupational History  Not on file  Tobacco Use   Smoking status: Every Day    Current packs/day: 1.00    Average packs/day: 1 pack/day for 50.0 years (50.0 ttl pk-yrs)    Types: Cigarettes   Smokeless tobacco: Never  Vaping Use   Vaping status: Never Used  Substance and Sexual Activity   Alcohol use: Not Currently   Drug use: Yes    Types: Marijuana    Comment: have changed to hemp now 09/17/17   Sexual activity: Yes  Other Topics Concern   Not on file  Social  History Narrative   Not on file   Social Drivers of Health   Tobacco Use: High Risk (03/15/2024)   Patient History    Smoking Tobacco Use: Every Day    Smokeless Tobacco Use: Never    Passive Exposure: Not on file  Financial Resource Strain: Low Risk (03/15/2024)   Overall Financial Resource Strain (CARDIA)    Difficulty of Paying Living Expenses: Not hard at all  Food Insecurity: No Food Insecurity (03/15/2024)   Epic    Worried About Programme Researcher, Broadcasting/film/video in the Last Year: Never true    Ran Out of Food in the Last Year: Never true  Transportation Needs: No Transportation Needs (03/15/2024)   Epic    Lack of Transportation (Medical): No    Lack of Transportation (Non-Medical): No  Physical Activity: Inactive (03/15/2024)   Exercise Vital Sign    Days of Exercise per Week: 0 days    Minutes of Exercise per Session: 0 min  Stress: No Stress Concern Present (03/15/2024)   [employer] of Occupational Health - Occupational Stress Questionnaire    Feeling of Stress: Not at all  Social Connections: Moderately Isolated (03/14/2024)   Social Connection and Isolation Panel    Frequency of Communication with Friends and Family: More than three times a week    Frequency of Social Gatherings with Friends and Family: More than three times a week    Attends Religious Services: Never    Database Administrator or Organizations: No    Attends Engineer, Structural: Not on file    Marital Status: Married  Catering Manager Violence: Not At Risk (03/15/2024)   Epic    Fear of Current or Ex-Partner: No    Emotionally Abused: No    Physically Abused: No    Sexually Abused: No  Depression (PHQ2-9): Low Risk (03/15/2024)   Depression (PHQ2-9)    PHQ-2 Score: 1  Alcohol Screen: Low Risk (09/29/2022)   Alcohol Screen    Last Alcohol Screening Score (AUDIT): 0  Housing: Low Risk (03/15/2024)   Epic    Unable to Pay for Housing in the Last Year: No    Number of Times Moved in the  Last Year: 0    Homeless in the Last Year: No  Utilities: Not At Risk (03/15/2024)   Epic    Threatened with loss of utilities: No  Health Literacy: Adequate Health Literacy (03/15/2024)   B1300 Health Literacy    Frequency of need for help with medical instructions: Never    Family History  Problem Relation Age of Onset   Diabetes Mother    Cancer Mother        unsure what type   Heart disease Mother    Hypertension Mother    Dementia Father    Diabetes Brother    Dementia Maternal Grandfather    Cancer Paternal Grandmother 68       breast  Mental illness Neg Hx     Past Surgical History:  Procedure Laterality Date   APPENDECTOMY  1970    ROS: Review of Systems Negative except as stated above  PHYSICAL EXAM: BP 103/68 (BP Location: Left Arm, Patient Position: Sitting, Cuff Size: Large)   Pulse 97   Temp 98.3 F (36.8 C) (Oral)   SpO2 96%   Wt Readings from Last 3 Encounters:  03/15/24 230 lb (104.3 kg)  02/25/24 253 lb 8.5 oz (115 kg)  11/11/23 253 lb 8.5 oz (115 kg)    Physical Exam   {male adult master:[id number]}     Latest Ref Rng & Units 03/15/2024    4:23 PM 11/28/2023   12:00 AM 11/22/2023   11:39 AM  CMP  Glucose 70 - 99 mg/dL 74  894  858   BUN 8 - 27 mg/dL 17  30  27    Creatinine 0.76 - 1.27 mg/dL 8.91  8.64  8.73   Sodium 134 - 144 mmol/L 131  132  137   Potassium 3.5 - 5.2 mmol/L 4.9  5.0  5.7   Chloride 96 - 106 mmol/L 89  94  100   CO2 20 - 29 mmol/L 30  21  21    Calcium  8.6 - 10.2 mg/dL 9.1  9.3  9.3   Total Protein 6.0 - 8.5 g/dL 6.4     Total Bilirubin 0.0 - 1.2 mg/dL 0.4     Alkaline Phos 47 - 123 IU/L 109     AST 0 - 40 IU/L 12     ALT 0 - 44 IU/L 11      Lipid Panel     Component Value Date/Time   CHOL 156 11/22/2023 1139   TRIG 112 11/22/2023 1139   HDL 52 11/22/2023 1139   CHOLHDL 3.0 11/22/2023 1139   CHOLHDL 3.3 09/30/2016 0651   VLDL 35 09/30/2016 0651   LDLCALC 84 11/22/2023 1139    CBC    Component Value  Date/Time   WBC 7.6 03/15/2024 1623   WBC 6.7 11/13/2023 1100   RBC 5.40 03/15/2024 1623   RBC 5.03 11/13/2023 1100   HGB 15.5 03/15/2024 1623   HCT 48.9 03/15/2024 1623   PLT 176 03/15/2024 1623   MCV 91 03/15/2024 1623   MCH 28.7 03/15/2024 1623   MCH 30.8 11/13/2023 1100   MCHC 31.7 03/15/2024 1623   MCHC 33.7 11/13/2023 1100   RDW 14.5 03/15/2024 1623   LYMPHSABS 1.7 11/11/2023 2055   LYMPHSABS 2.7 07/09/2021 1630   MONOABS 0.7 11/11/2023 2055   EOSABS 0.3 11/11/2023 2055   EOSABS 0.1 07/09/2021 1630   BASOSABS 0.1 11/11/2023 2055   BASOSABS 0.1 07/09/2021 1630    ASSESSMENT AND PLAN: 1. Abscess of right buttock (Primary) *** - sulfamethoxazole -trimethoprim  (BACTRIM  DS) 800-160 MG tablet; Take 1 tablet by mouth 2 (two) times daily.  Dispense: 14 tablet; Refill: 0  2. Skin lesion of right arm *** - Ambulatory referral to Dermatology  3. Pancreatic lesion ***  4. Chronic respiratory failure with hypoxia, on home O2 therapy (HCC) ***  5. Avascular necrosis of hip, left (HCC) *** - AMB referral to orthopedics  6. Primary osteoarthritis of left knee *** - AMB referral to orthopedics  7. Need for influenza vaccination ***  Assessment and Plan Assessment & Plan Cutaneous abscess of right buttock Chronic nodule, recently enlarged and painful, self-drained with pus and blood. Likely a self-drained boil. - Prescribed antibiotics for potential infection. - Advised emergency care  for swelling for possible incision and drainage.  Cutaneous abscess of left buttock Small, firm nodule, not draining. Likely a developing boil. - Prescribed antibiotics for potential infection.  Chronic skin lesion of right upper arm, suspicious for malignancy Chronic lesion, increased in size and pain, 3x3 cm, with crust and soreness. Differential includes skin cancer. - Referred to dermatologist for evaluation and possible biopsy.  General health maintenance Discussed flu shot,  agreeable to receive. - Administered flu shot.     There are no diagnoses linked to this encounter.   Patient was given the opportunity to ask questions.  Patient verbalized understanding of the plan and was able to repeat key elements of the plan.   This documentation was completed using Paediatric nurse.  Any transcriptional errors are unintentional.  Orders Placed This Encounter  Procedures   Flu vaccine HIGH DOSE PF(Fluzone Trivalent)     Requested Prescriptions    No prescriptions requested or ordered in this encounter    No follow-ups on file.  Barnie Louder, MD, FACP    [1]  Current Outpatient Medications on File Prior to Visit  Medication Sig Dispense Refill   Accu-Chek FastClix Lancets MISC Use to check blood sugars three times per day 102 each 11   albuterol  (PROAIR  HFA) 108 (90 Base) MCG/ACT inhaler Inhale 2 puffs into the lungs every 4 (four) hours as needed for wheezing or shortness of breath. 8.5 g 0   amLODipine  (NORVASC ) 5 MG tablet Take 1 tablet (5 mg total) by mouth daily. 90 tablet 1   apixaban  (ELIQUIS ) 5 MG TABS tablet Take 1 tablet (5 mg total) by mouth 2 (two) times daily. Start after completion of starter pack. 60 tablet 4   blood glucose meter kit and supplies KIT Dispense based on patient and insurance preference. Use up to four times daily as directed. ICD-10 E11.65  Z79.4 1 each 0   Blood Glucose Monitoring Suppl (ACCU-CHEK GUIDE ME) w/Device KIT Use to check blood sugars up to 3 times per day. ICD-10 E11.65, Z79.4 1 kit 0   cetirizine  (ZYRTEC ) 10 MG tablet Take 1 tablet (10 mg total) by mouth daily. 30 tablet 6   fluticasone -salmeterol (ADVAIR ) 250-50 MCG/ACT AEPB Inhale 1 puff into the lungs 2 (two) times daily. 60 each 6   gabapentin  (NEURONTIN ) 300 MG capsule Take 2 capsules (600 mg total) by mouth 3 (three) times daily. 180 capsule 6   glucose blood (ACCU-CHEK GUIDE) test strip Use as instructed to check blood sugars three  times per day. 100 each 11   insulin  glargine (LANTUS ) 100 unit/mL SOPN Inject 38 Units into the skin at bedtime. 15 mL 4   insulin  lispro (HUMALOG  KWIKPEN) 100 UNIT/ML KwikPen Inject 10 Units into the skin 3 (three) times daily. 15 mL 3   Insulin  Pen Needle (PEN NEEDLES) 32G X 4 MM MISC Use to inject insulin  four times daily. Also, Ozempic  once weekly. 100 each 6   ipratropium-albuterol  (DUONEB) 0.5-2.5 (3) MG/3ML SOLN Take 3 mLs by nebulization every 6 (six) hours as needed (shortness of breath). 180 mL 1   Lancets Misc. (ACCU-CHEK FASTCLIX LANCET) KIT Use to check blood sugars 3 times per day 1 kit 11   omeprazole  (PRILOSEC) 20 MG capsule Take 1 capsule (20 mg total) by mouth daily. 90 capsule 1   paliperidone  (INVEGA ) 6 MG 24 hr tablet Take 6 mg by mouth at bedtime.     Semaglutide , 2 MG/DOSE, (OZEMPIC , 2 MG/DOSE,) 8 MG/3ML SOPN Inject 2  mg as directed once a week. 9 mL 2   SUMAtriptan  (IMITREX ) 50 MG tablet Take 1 tablet by mouth at the start of headache.  May repeat in 2 hours if headache persists. Max 2 tabs/24hr. 10 tablet 2   traMADol  (ULTRAM ) 50 MG tablet Take 1 tablet (50 mg total) by mouth every 6 (six) hours as needed for moderate pain (pain score 4-6) or severe pain (pain score 7-10). 120 tablet 3   umeclidinium bromide  (INCRUSE ELLIPTA ) 62.5 MCG/ACT AEPB Inhale 1 puff into the lungs daily. 30 each 4   valsartan -hydrochlorothiazide  (DIOVAN -HCT) 320-25 MG tablet Take 1 tablet by mouth daily. 90 tablet 3   Blood Pressure Monitoring (BLOOD PRESSURE KIT) DEVI Use to measure blood pressure 1 each 0   diclofenac  (VOLTAREN ) 50 MG EC tablet Take 1 tablet (50 mg total) by mouth 2 (two) times daily as needed for moderate pain. Eat before taking the medicine. 60 tablet 2   Respiratory Therapy Supplies (FLUTTER) DEVI Use with 4 times daily (Patient not taking: Reported on 11/22/2023) 1 each 0   Spacer/Aero-Holding Chambers (AEROCHAMBER MV) inhaler Use as instructed 1 each 0   No current  facility-administered medications on file prior to visit.  [2]  Allergies Allergen Reactions   Statins Other (See Comments)    Myopathy   Haldol [Haloperidol] Other (See Comments)    Unk reaction   Metformin  And Related Diarrhea and Nausea And Vomiting   Trazodone  And Nefazodone Itching   "

## 2024-04-25 ENCOUNTER — Encounter: Payer: Self-pay | Admitting: Internal Medicine

## 2024-04-25 DIAGNOSIS — J9611 Chronic respiratory failure with hypoxia: Secondary | ICD-10-CM | POA: Insufficient documentation

## 2024-04-27 ENCOUNTER — Ambulatory Visit (HOSPITAL_COMMUNITY)

## 2024-05-01 ENCOUNTER — Other Ambulatory Visit: Payer: Self-pay

## 2024-05-01 DIAGNOSIS — L0231 Cutaneous abscess of buttock: Secondary | ICD-10-CM

## 2024-05-01 MED ORDER — SULFAMETHOXAZOLE-TRIMETHOPRIM 800-160 MG PO TABS
1.0000 | ORAL_TABLET | Freq: Two times a day (BID) | ORAL | 0 refills | Status: AC
  Filled 2024-05-01: qty 14, 7d supply, fill #0

## 2024-05-01 NOTE — Addendum Note (Signed)
 Addended by: VICCI SOBER B on: 05/01/2024 01:43 PM   Modules accepted: Orders

## 2024-05-01 NOTE — Telephone Encounter (Signed)
 Called & spoke to the patient. Verified name & DOB. Informed that prescription was sent to the pharmacy. Patient expressed verbal understanding.

## 2024-05-01 NOTE — Telephone Encounter (Signed)
"  Rxn sent.  "

## 2024-05-01 NOTE — Telephone Encounter (Signed)
 Copied from CRM [id number]. Topic: General - Other >> May 01, 2024 12:46 PM Zebedee SAUNDERS wrote:  Reason for CRM: Pt called stated wound has not completely healed and would like sulfamethoxazole -trimethoprim  (BACTRIM  DS) 800-160 MG tablet [[id number]] sent to  [facility] [address], [place] [address] Phone: [phone]  Fax: [phone]  Pt would like a call back at [phone].

## 2024-05-02 ENCOUNTER — Other Ambulatory Visit: Payer: Self-pay

## 2024-05-06 ENCOUNTER — Other Ambulatory Visit: Payer: Self-pay | Admitting: Critical Care Medicine

## 2024-05-06 ENCOUNTER — Other Ambulatory Visit: Payer: Self-pay | Admitting: Internal Medicine

## 2024-05-06 ENCOUNTER — Other Ambulatory Visit: Payer: Self-pay | Admitting: Pharmacist

## 2024-05-06 DIAGNOSIS — E1165 Type 2 diabetes mellitus with hyperglycemia: Secondary | ICD-10-CM

## 2024-05-06 DIAGNOSIS — E669 Obesity, unspecified: Secondary | ICD-10-CM

## 2024-05-06 DIAGNOSIS — I82412 Acute embolism and thrombosis of left femoral vein: Secondary | ICD-10-CM

## 2024-05-06 MED ORDER — PEN NEEDLES 32G X 4 MM MISC
6 refills | Status: AC
  Filled 2024-05-06: qty 100, 25d supply, fill #0

## 2024-05-06 MED ORDER — INSULIN LISPRO (1 UNIT DIAL) 100 UNIT/ML (KWIKPEN)
10.0000 [IU] | PEN_INJECTOR | Freq: Three times a day (TID) | SUBCUTANEOUS | 3 refills | Status: AC
  Filled 2024-05-06: qty 15, 50d supply, fill #0

## 2024-05-07 ENCOUNTER — Other Ambulatory Visit: Payer: Self-pay

## 2024-05-07 ENCOUNTER — Other Ambulatory Visit: Payer: Self-pay | Admitting: Pharmacist

## 2024-05-07 DIAGNOSIS — I82412 Acute embolism and thrombosis of left femoral vein: Secondary | ICD-10-CM

## 2024-05-09 ENCOUNTER — Ambulatory Visit: Admitting: Physician Assistant

## 2024-05-09 ENCOUNTER — Other Ambulatory Visit: Payer: Self-pay | Admitting: Pharmacist

## 2024-05-09 ENCOUNTER — Other Ambulatory Visit: Payer: Self-pay

## 2024-05-09 DIAGNOSIS — I82412 Acute embolism and thrombosis of left femoral vein: Secondary | ICD-10-CM

## 2024-05-09 MED ORDER — APIXABAN 5 MG PO TABS
5.0000 mg | ORAL_TABLET | Freq: Two times a day (BID) | ORAL | 1 refills | Status: AC
  Filled 2024-05-09: qty 60, 30d supply, fill #0

## 2024-05-11 ENCOUNTER — Other Ambulatory Visit: Payer: Self-pay

## 2024-05-14 ENCOUNTER — Other Ambulatory Visit: Payer: Self-pay | Admitting: Internal Medicine

## 2024-05-14 ENCOUNTER — Other Ambulatory Visit: Payer: Self-pay

## 2024-05-14 DIAGNOSIS — I152 Hypertension secondary to endocrine disorders: Secondary | ICD-10-CM

## 2024-05-14 MED ORDER — AMLODIPINE BESYLATE 5 MG PO TABS
5.0000 mg | ORAL_TABLET | Freq: Every day | ORAL | 1 refills | Status: AC
  Filled 2024-05-14: qty 90, 90d supply, fill #0

## 2024-05-15 ENCOUNTER — Other Ambulatory Visit: Payer: Self-pay

## 2024-05-22 ENCOUNTER — Ambulatory Visit: Admitting: Physician Assistant

## 2024-06-18 ENCOUNTER — Encounter

## 2024-06-18 ENCOUNTER — Ambulatory Visit (HOSPITAL_COMMUNITY)

## 2024-07-13 ENCOUNTER — Ambulatory Visit: Admitting: Internal Medicine
